# Patient Record
Sex: Male | Born: 1965 | Race: Black or African American | Hispanic: No | Marital: Single | Smoking: Current every day smoker
Health system: Southern US, Community
[De-identification: ages and names within clinical notes are randomized; demographics above are authoritative.]

## PROBLEM LIST (undated history)

## (undated) DIAGNOSIS — R51 Headache: Secondary | ICD-10-CM

## (undated) DIAGNOSIS — I10 Essential (primary) hypertension: Secondary | ICD-10-CM

## (undated) DIAGNOSIS — R519 Headache, unspecified: Secondary | ICD-10-CM

## (undated) DIAGNOSIS — I509 Heart failure, unspecified: Secondary | ICD-10-CM

## (undated) DIAGNOSIS — F1410 Cocaine abuse, uncomplicated: Secondary | ICD-10-CM

## (undated) DIAGNOSIS — I639 Cerebral infarction, unspecified: Secondary | ICD-10-CM

## (undated) HISTORY — DX: Cocaine abuse, uncomplicated: F14.10

## (undated) HISTORY — DX: Cerebral infarction, unspecified: I63.9

## (undated) HISTORY — PX: OTHER SURGICAL HISTORY: SHX169

## (undated) HISTORY — PX: NO PAST SURGERIES: SHX2092

---

## 1998-04-30 ENCOUNTER — Encounter: Payer: Self-pay | Admitting: Emergency Medicine

## 1998-04-30 ENCOUNTER — Emergency Department (HOSPITAL_COMMUNITY): Admission: EM | Admit: 1998-04-30 | Discharge: 1998-04-30 | Payer: Self-pay | Admitting: Emergency Medicine

## 2001-03-03 ENCOUNTER — Inpatient Hospital Stay (HOSPITAL_COMMUNITY): Admission: EM | Admit: 2001-03-03 | Discharge: 2001-03-06 | Payer: Self-pay | Admitting: Psychiatry

## 2001-06-07 ENCOUNTER — Emergency Department (HOSPITAL_COMMUNITY): Admission: EM | Admit: 2001-06-07 | Discharge: 2001-06-07 | Payer: Self-pay

## 2002-03-07 ENCOUNTER — Emergency Department (HOSPITAL_COMMUNITY): Admission: EM | Admit: 2002-03-07 | Discharge: 2002-03-07 | Payer: Self-pay | Admitting: Emergency Medicine

## 2002-05-06 ENCOUNTER — Emergency Department (HOSPITAL_COMMUNITY): Admission: EM | Admit: 2002-05-06 | Discharge: 2002-05-06 | Payer: Self-pay | Admitting: Emergency Medicine

## 2002-05-06 ENCOUNTER — Encounter: Payer: Self-pay | Admitting: Emergency Medicine

## 2002-06-09 ENCOUNTER — Emergency Department (HOSPITAL_COMMUNITY): Admission: EM | Admit: 2002-06-09 | Discharge: 2002-06-09 | Payer: Self-pay | Admitting: Emergency Medicine

## 2002-10-07 ENCOUNTER — Emergency Department (HOSPITAL_COMMUNITY): Admission: EM | Admit: 2002-10-07 | Discharge: 2002-10-07 | Payer: Self-pay | Admitting: Emergency Medicine

## 2002-10-07 ENCOUNTER — Encounter: Payer: Self-pay | Admitting: Emergency Medicine

## 2002-10-10 ENCOUNTER — Emergency Department (HOSPITAL_COMMUNITY): Admission: EM | Admit: 2002-10-10 | Discharge: 2002-10-10 | Payer: Self-pay | Admitting: Emergency Medicine

## 2002-10-18 ENCOUNTER — Emergency Department (HOSPITAL_COMMUNITY): Admission: EM | Admit: 2002-10-18 | Discharge: 2002-10-18 | Payer: Self-pay | Admitting: Emergency Medicine

## 2003-01-10 ENCOUNTER — Emergency Department (HOSPITAL_COMMUNITY): Admission: EM | Admit: 2003-01-10 | Discharge: 2003-01-10 | Payer: Self-pay | Admitting: *Deleted

## 2003-03-06 ENCOUNTER — Emergency Department (HOSPITAL_COMMUNITY): Admission: EM | Admit: 2003-03-06 | Discharge: 2003-03-06 | Payer: Self-pay | Admitting: Emergency Medicine

## 2003-04-28 ENCOUNTER — Emergency Department (HOSPITAL_COMMUNITY): Admission: EM | Admit: 2003-04-28 | Discharge: 2003-04-28 | Payer: Self-pay | Admitting: Emergency Medicine

## 2003-12-03 ENCOUNTER — Emergency Department (HOSPITAL_COMMUNITY): Admission: EM | Admit: 2003-12-03 | Discharge: 2003-12-03 | Payer: Self-pay | Admitting: Emergency Medicine

## 2004-03-25 ENCOUNTER — Emergency Department (HOSPITAL_COMMUNITY): Admission: EM | Admit: 2004-03-25 | Discharge: 2004-03-25 | Payer: Self-pay | Admitting: Emergency Medicine

## 2004-04-10 ENCOUNTER — Ambulatory Visit: Payer: Self-pay | Admitting: Internal Medicine

## 2004-05-01 ENCOUNTER — Emergency Department (HOSPITAL_COMMUNITY): Admission: EM | Admit: 2004-05-01 | Discharge: 2004-05-01 | Payer: Self-pay | Admitting: Emergency Medicine

## 2004-05-08 ENCOUNTER — Inpatient Hospital Stay (HOSPITAL_COMMUNITY): Admission: EM | Admit: 2004-05-08 | Discharge: 2004-05-09 | Payer: Self-pay | Admitting: Emergency Medicine

## 2004-05-13 ENCOUNTER — Ambulatory Visit: Payer: Self-pay | Admitting: Internal Medicine

## 2004-06-06 ENCOUNTER — Emergency Department (HOSPITAL_COMMUNITY): Admission: EM | Admit: 2004-06-06 | Discharge: 2004-06-06 | Payer: Self-pay | Admitting: Emergency Medicine

## 2004-07-09 ENCOUNTER — Emergency Department (HOSPITAL_COMMUNITY): Admission: EM | Admit: 2004-07-09 | Discharge: 2004-07-09 | Payer: Self-pay | Admitting: Emergency Medicine

## 2004-10-02 ENCOUNTER — Ambulatory Visit: Payer: Self-pay | Admitting: Internal Medicine

## 2004-10-13 ENCOUNTER — Ambulatory Visit: Payer: Self-pay | Admitting: *Deleted

## 2005-10-06 ENCOUNTER — Emergency Department (HOSPITAL_COMMUNITY): Admission: EM | Admit: 2005-10-06 | Discharge: 2005-10-06 | Payer: Self-pay | Admitting: Emergency Medicine

## 2006-06-04 ENCOUNTER — Emergency Department (HOSPITAL_COMMUNITY): Admission: EM | Admit: 2006-06-04 | Discharge: 2006-06-04 | Payer: Self-pay | Admitting: Emergency Medicine

## 2006-07-30 ENCOUNTER — Emergency Department (HOSPITAL_COMMUNITY): Admission: EM | Admit: 2006-07-30 | Discharge: 2006-07-30 | Payer: Self-pay | Admitting: Emergency Medicine

## 2006-12-29 ENCOUNTER — Emergency Department (HOSPITAL_COMMUNITY): Admission: EM | Admit: 2006-12-29 | Discharge: 2006-12-29 | Payer: Self-pay | Admitting: Emergency Medicine

## 2007-03-26 ENCOUNTER — Inpatient Hospital Stay (HOSPITAL_COMMUNITY): Admission: EM | Admit: 2007-03-26 | Discharge: 2007-03-29 | Payer: Self-pay | Admitting: Emergency Medicine

## 2007-05-12 ENCOUNTER — Encounter: Admission: RE | Admit: 2007-05-12 | Discharge: 2007-06-02 | Payer: Self-pay | Admitting: Orthopedic Surgery

## 2010-06-04 NOTE — H&P (Signed)
[name], [name] NO.:  [id number]   MEDICAL RECORD NO.:  [id number]          PATIENT TYPE:  INP   LOCATION:  5037                         FACILITY:  [facility]   PHYSICIAN:  Richardean Canal, P.A.    DATE OF BIRTH:  1965-02-16   DATE OF ADMISSION:  03/26/2007  DATE OF DISCHARGE:                              HISTORY & PHYSICAL   CHIEF COMPLAINT:  Left tib-fib fracture.   HISTORY OF PRESENT ILLNESS:  [name] is a 45 year old African  American male who fell off a porch at 1500 hours today.  The patient  sustained a left leg injury and is unable to bear weight.  No other  injuries.  No loss of consciousness.  The patient was brought to the ER  by car and found to have a left tib-fib fracture which was a displaced  but closed fracture.  The patient stated he had been drinking prior to  the fall and had had approximately 40 ounces of beer and 2 shots of  liquor.  Denied any drug use currently.  Last meal was the day prior on  March 25, 2007.   ALLERGIES:  Penicillin.   MEDICATIONS:  Toprol, unsure of dosage.   PAST MEDICAL HISTORY:  Hypertension.   PAST SURGICAL HISTORY:  A plate placed in his head.   SOCIAL HISTORY:  Positive half pack of cigarettes daily for 30 years.  Positive ETOH.  No recent drug use, however, history of cocaine use.  He  is unemployed.   PRIMARY CARE PHYSICIAN:  [name].   REVIEW OF SYSTEMS:  Denies any chest pain, shortness of breath, coronary  artery disease, diabetes mellitus, respiratory disease.  Positive  hypertension.   PHYSICAL EXAMINATION:  VITALS:  Temperature 98.1 degrees Fahrenheit,  blood pressure  126/78, pulse 79, respiratory rate 16.  O2 saturation  99% on room air.  GENERAL: The patient is a well-developed, well-nourished male in no  acute distress.  Slightly drowsy secondary to EtOH and pain medications  administered in the ER.  CARDIAC:  Regular rate and rhythm, no murmurs, rubs or gallops.  ABDOMEN: Soft,  nontender.  Positive bowel sounds.  CHEST: Clear to auscultation bilaterally.  No wheezing, rhonchi or  rales.  LEFT LOWER LEG:  Obvious deformity of the left lower leg.  Foot is  neurovascularly intact.  Compartments are soft.  He has good sensation  to light touch throughout the toes.  EHL, FHL is intact.   RADIOGRAPHS:  Radiographs left tib-fib showed displaced distal tibia  fracture and proximal left fibula fracture.   LABORATORY:  Labs at time of dictation:  CBC was back and H&H was 13.5  and 38.9.  White blood cell count was 10,900, platelets 158,000.   ASSESSMENT/PLAN:  A 45 year old African American male with a left  tibia/fibula fracture, displacement, closed and a history of  hypertension.  Also, a history of cocaine use, alcohol abuse and tobacco  abuse.   1. Intramedullary nail left tibia-fibula by Dr. Lestine Box on March 26, 2007.  2. Check labs.  Also, a portable chest x-ray  was ordered preop the EKG      was ordered preop.  3. Will admit patient postop to Dr. Ashok Norris service for pain and      observation.      Richardean Canal, P.A.     GC/MEDQ  D:  03/26/2007  T:  03/29/2007  Job:  [id number]   cc:   Leonides Grills, M.D.

## 2010-06-04 NOTE — Op Note (Signed)
NAMEABU, HEAVIN NO.:  [id number]   MEDICAL RECORD NO.:  [id number]          PATIENT TYPE:  INP   LOCATION:  5037                         FACILITY:  [facility]   PHYSICIAN:  Leonides Grills, M.D.     DATE OF BIRTH:  03-25-1965   DATE OF PROCEDURE:  03/26/2007  DATE OF DISCHARGE:                               OPERATIVE REPORT   PREOPERATIVE DIAGNOSIS:  Left closed tib-fib fracture.   POSTOPERATIVE DIAGNOSIS:  Left closed tib-fib fracture.   OPERATION:  IM nailing left tib-fib fracture.   ANESTHESIA:  General.   SURGEON:  Leonides Grills, MD.   ASSISTANT:  Rexene Edison, PA-C.   ESTIMATED BLOOD LOSS:  Minimal.   TOURNIQUET TIME:  Approximately an hour and 20 minutes.   COMPLICATIONS:  None.   DISPOSITION:  Stable to PR.   INDICATIONS:  This is a 45 year old male who slipped and fell off a  porch this afternoon and sustained the above injury.  He was consented  for the above procedure.  All risks which include infection, nerve or  vessel injury, nonunion, malunion, hardware tissue, hardware failure,  persistent worse pain, prolonged recovery, the defects of smoking on  healing, stiffness, arthritis of juxta-articular joints and possibility  that the nail or hardware needs to be removed were all explained and  questions were encouraged and answered.   OPERATION:  The patient brought to the operating room, placed in supine  position after adequate general endotracheal tube anesthesia  was  administered as well as Ancef gram IV piggyback.  The bump was placed on  the ipsilateral hip internally rotating the left lower extremity.  Left  lower extremity was then prepped and draped in a sterile manner over a  proximally thigh tourniquet.  Limb was gravity saline and tourniquet was  elevated 290 mmHg.  A medial parapatellar incision was then made.  Dissection was carried down through skin.  Hemostasis was obtained.  Retinaculum was opened just medial to the patella  tendon.  Dissection  was carried down to the anterior central aspect of the tibial plateau.  Under C-arm guidance, a awl was then placed and once this was centered  in the AP and lateral planes, ball-tip guide wire was placed.  Fracture  site was then anatomically reduced and once this was done, we then  placed a guidewire across the fracture site to the center of the tibial  plafond in both the AP and lateral planes.  We then reamed to a 9 mm  reamer where we had significant chatter at the isthmus.  We then  measured off the guidewire and chose a 145.  We then chose a 145 x 8 mm  Synthes tibial IM nail.  We then advanced this over the guidewire and  across the fracture site, then removed the guide wire and advanced the  rest of the nail to the depth that was desired.  We then proximally  locked the nail with one static, one dynamic screw through the out  rigger device through a nick and spread technique.  Once these screws  were in place and  this was verified under C-arm guidance in AP and  lateral planes to be in excellent position, we then, using a free-handed  technique, placed two distal locking screws in the coronal plane  multiple, both of which were in the nail and this was done with the  fracture site held in reduced position.  Final x-rays were obtained in  AP and lateral planes and showed locking screws in proper position and  acceptable alignment.  Tibial crest was in line with the second toe  which was consistent with the contralateral side.  Wounds were copiously  irrigated with normal saline.  End cap was applied to the proximal  portion of the nail which was a zero cap.  Tourniquet deflated.  Hemostasis was obtained.  Compartments were soft in the leg and foot.  At the end of the procedure, there was palpable dorsalis pedis and  posterior tibial pulse.  Foot was warm.  Retinaculum was closed with  zero Vicryl.  Subcu was closed 3-0 Vicryl.  Skin was closed with  staples.   Sterile dressing was applied.  A posterior mold was applied.  The patient was stable to the PR.      Leonides Grills, M.D.  Electronically Signed     PB/MEDQ  D:  03/27/2007  T:  03/29/2007  Job:  [id number]

## 2010-06-04 NOTE — Discharge Summary (Signed)
[name], [name]              ACCOUNT NO.:  [id number]   MEDICAL RECORD NO.:  [id number]          PATIENT TYPE:  INP   LOCATION:  5037                         FACILITY:  [facility]   PHYSICIAN:  Leonides Grills, M.D.     DATE OF BIRTH:  May 28, 1965   DATE OF ADMISSION:  03/26/2007  DATE OF DISCHARGE:  03/29/2007                               DISCHARGE SUMMARY   ADMITTING DIAGNOSES:  1. Closed left tibial fracture displaced.  2. Closed left proximal femoral fracture.  3. Hypertension.   DISCHARGE DIAGNOSES:  1. Status post intramedullary nailing of left tibial fracture.  2. Hypertension, poor control.   HISTORY OF PRESENT ILLNESS:  Mr. Scurlock is a 45 year old African  American male who fell off a porch at 1500 hours on March 26, 2007.  The  patient sustained left leg injury and was unable to bear weight.  No  other injuries.  No loss of consciousness.  The patient was brought to  the ER by car and found to have a left tib-fib fracture which was  displaced but closed.  The patient stated he had been drinking prior to  falling and had approximately 40 ounces of beer and 2 shots of liquor.  He denied any current drug use.  The patient was admitted to undergo  left IM nailing of the tib-fib fracture.   SURGICAL PROCEDURE:  The patient was taken to the operating room on  March 26, 2007, by Dr. Leonides Grills assisted by Richardean Canal, P.A.-C.  The patient was placed under general anesthesia and then underwent an IM  nailing for left tibia and fibula fracture.  The patient tolerated the  procedure well and returned to recovery in good and stable condition.   CONSULTATIONS:  The following consults were obtained:  PT/OT, case  management.   HOSPITAL COURSE:  Postop day 1, the patient complained of pain.  He  __________  nontender foot, neurovascularly intact.  CBC: white count  was 10,400, hemoglobin 12.4, hematocrit 35.6, and platelets 142,000.  Routine chemistries on postop day 1, sodium  140, potassium 4.2, chloride  105, bicarb 27, glucose 80, BUN 6, and creatinine 1.07. On  postop day  2, the patient afebrile, vital signs stable, slightly hypertensive with  blood pressure 142/66.  He complained of lower leg pain.  Ace wrap was  suggested.  Postop day 3, the patient complained of some pain in left  lower leg.  No shortness of breath.  No chest pain.  No calf pain.  Tolerating diet.  Voiding well.  No bowel movement.  The patient is  afebrile.  The patient is hypertensive with blood pressure 176/103,  heart rate 94, and respiratory rate 18.  Hypertension was felt to be  probably secondary to pain.  Also questioned if the patient was on  correct dose of Toprol-XL as he did not know the dosage and lowest  dosage was started.  He was given Dulcolax 10 mg per rectum x1 due to  his constipation.  The patient was later discharged to home in good  stable condition.   ROUTINE LABORATORY ON ADMISSION:  White count was 10,900, hemoglobin  13.5, hematocrit 38.9, and platelet 158,000.  Coags on admission, PT was  12.7, INR was 0.9.  Routine chemistries on admission sodium is 131,  potassium 3.6, chloride 97, bicarb 21, glucose 78, BUN 5, creatinine is  0.17.  Hepatic enzymes on admission, TP was 6.7, ALT was 4.2, AST was  elevated at 59, ALT was elevated at 81, ALP was 73 and total bilirubin  was at 0.6.   Radiographs dated March 26, 2007, at 6:16 p.m. right tib-fib 2-view  showed comminuted leg fracture of distal tibial diaphysis.  Oblique  fracture approximately at fibular metaphysis.  Left ankle 2 view, no  evidence of ankle fractures and had distal tib-fib fracture noted.  Left  tib-fib on March 26, 2007, at 1150 hours, the patient is status post ORIF  with left tibial fracture, no complicating features.  Right proximal  fibular  __________ fractured shaft minimally displaced.   DISCHARGE INSTRUCTIONS:  1. Diet, no restrictions.  2. Percocet 5/325 1-2 tablets every 4-6 hours  for pain.  3. Robaxin 500 mg 1 tablet every 6-8 hours for spasm.  4. Aspirin 325 mg 1 tab twice a day x2 months.  5. Resume Toprol-XL.  6. Stool softener and laxative as needed for constipation.   FOLLOWUP:  The patient needs to follow up Dr. Lestine Box in 11 days.  The  patient to call [phone] for appointment.  The patient to follow up with  primary care physician Dr. __________ in  approximately 7 days for blood  pressure.  The patient will call for appointment.  The patient was given  prescriptions for Toprol 25 mg 1 p.o. daily.   SPECIAL INSTRUCTIONS:  The patient should not weight bear in left lower  leg.  Splints to be kept clean, dry, and intact.  The patient is to  elevate the left leg above the heart __________ .  Again, the patient's  nonweightbearing left lower leg with crutches.   CONDITION ON DISCHARGE:  The patient was discharged home in good stable  condition.      Richardean Canal, P.A.      Leonides Grills, M.D.  Electronically Signed    GC/MEDQ  D:  04/16/2007  T:  04/17/2007  Job:  [id number]

## 2010-06-07 NOTE — Consult Note (Signed)
NAMEMARVIS, [name]              ACCOUNT NO.:  [id number]   MEDICAL RECORD NO.:  [id number]          PATIENT TYPE:  INP   LOCATION:  1831                         FACILITY:  [facility]   PHYSICIAN:  Melvyn Novas, M.D.  DATE OF BIRTH:  1965-07-31   DATE OF CONSULTATION:  05/08/2004  DATE OF DISCHARGE:                                   CONSULTATION   NEUROLOGY CONSULTATION:   REASON FOR CONSULTATION:  Stroke in the ER.   HISTORY:  The patient is still in the ER after 15 hours.  This is a patient  who 15 hours ago presented to the ER with complaints of right-sided  weakness, the same complaints he had last Saturday and Sunday and the same  complaint that led to an ER evaluation last Wednesday.  [name]  is seen here today with recurrent right-sided weakness and involuntary  movements.  He appears to be rather agitated and somewhat aggressive and  during my interview he then began severely to shake, stiffen his body while  he still continues to speak.  Interestingly, as the body rigidity and  shaking becomes less severe he now starts stuttering which then in return  wears off and he refuses at that point to move at all but actively verbally  tells me that he is not going to do it.  In the ER since yesterday the  patient was finally accepted this morning by [facility] and  they requested a neurology consult for stroke after ordering an MRI.  The  MRI that I already read was normal by diffusion weighted images shows no  stroke.  The patient is now ruminating rocking his body back and forth then  is shaking.  When I offered him water, he stopped shaking and can hold the  cup with his weak right arm.  I asked him where his urinal is and he turned  his head and the right arm towards the urinal and actually put it next to  him on the bedside.   EXAMINATION:  Blood pressure is 123/79, heart rate is 62, respiratory rate  is 16, temperature is 97, oxygenation is 99% on  room air.  The patient has  clear auscultation of the heart and lungs.  Abdomen is soft.  The patient  wet himself in spite of the urinal standing right next to him and him  obviously being aware of where it stands.   Mental status:  The patient started stuttering in mid sentence after  speaking fluently and clearly before.  He is aware that he was in the ER  last week, he was aware that he came to the ER yesterday evening, he knows  date and time.  Cranial nerve examination:  Pupils are equal to light and  accommodation, full gaze, symmetric facial strength, tongue and uvula are  midline, he has a normal gag.  When I asked him to stick out his tongue, he  pushed his tongue into his right buccal tissue and I can feel good  resistance there but he is obviously trying to impress [name] with a  tongue  deviation.  Neck appears rigid.  He does not allow me to flex it but when I  put his bed back rest up he was able to actually move his neck spontaneously  in all directions.   Motor examination shows equal grip on first attempt.  On second attempt the  patient decided to spread his thumb and index finger on the right hand so  that he would not flex them for the grip exam that he previously performed  with all five fingers.  When I asked him to spread his fingers and keep them  apart, there is no weakness of the thumb or index finger.  Deep tendon  reflexes are 2+.  Each time I elicit a reflex the patient states to me that  it hurts and gets very upset about it.  Sensory is not otherwise testable.  The patient is not compliant.  Babinski's negative, downgoing to plantar  stimulation.  The patient refuses to sit up without assistance and states he  cannot walk.   ASSESSMENT:  Inconsistent examination not likely related to a focal  neurologic deficit and actually not likely related to an organic brain  lesion.  Diffusion weighted image was negative.  MRI looked normal.  I would  strongly suspect  that this is active resistance to passive movement and  __________ response to leg raise, etc., has either suffered from a  conversion disorder or is malingering or has a history of a psychiatric  illness that does not allow him  to reflect on his behavior.  I strongly recommended psychiatric consult.  The patient at this time has stable vital signs, is afebrile, has a normal  MRI, normal EKG and should be transferred to [facility].  He is  medically cleared.      CD/MEDQ  D:  05/08/2004  T:  05/08/2004  Job:  [id number]   cc:   Antonietta Breach, M.D.   Dr. __________  [employer]

## 2010-06-07 NOTE — H&P (Signed)
[facility]  Patient:    [name], [name] Visit Number: [id number] MRN: [id number]          Service Type: PSY Location: 300 0301 02 Attending Physician:  Rachael Fee Dictated by:   Candi Leash. Orsini, N.P. Admit Date:  03/03/2001                     Psychiatric Admission Assessment  DATE OF ADMISSION:  March 03, 2001.  PATIENT IDENTIFICATION:  This is a 45 year old single African-American male voluntarily admitted on March 03, 2001, to [facility] for alcohol dependence and depression.  HISTORY OF PRESENT ILLNESS:  The patient presents with a history of alcohol use requesting detoxification.  The patient has been drinking since the age of 2 where the longest history of sobriety has been four days while in jail.  He has been drinking nine 40 ounce beers and a pint of liquor with a history of blackouts.  His last drink was on March 02, 2001, having withdrawal symptoms on admission.  Reports depressive symptoms but denying any suicidal thoughts.  He reports he gets "violent" when he drinks.  Sleeping and appetite has been satisfactory.  He denies any psychotic symptoms.  He denies withdrawal symptoms at present.  PAST PSYCHIATRIC HISTORY:  First hospitalization to [facility]. No other inpatient admissions.  He was at [facility] but none recently.  Longest history of sobriety has been four days while the patient was in jail in September 2002.  SUBSTANCE ABUSE HISTORY:  He smokes.  He drinks nine 40 ounce beers plus a pint of liquor every day since the age of 52.  History of blackouts.  No history of seizures.  Has been smoking marijuana and cocaine prior to this admission.  PAST MEDICAL HISTORY:  Primary care [facility]: Dr. Donia Guiles.  Medical problems: Hypertension.  MEDICATIONS:  None.  DRUG ALLERGIES:  PENICILLIN.  PHYSICAL EXAMINATION:  GENERAL:  The patient appears as a  well-nourished African-American male without complaints or withdrawal symptoms.  LABORATORY DATA:  Alcohol level was less than 5.  BUN 4.  RBC 4.03, hematocrit 38.3.  SOCIAL HISTORY:  This is a 45 year old single African-American male with one child, 49 years of age.  He lives with his girlfriend.  He works in Human resources officer.  He completed the 12th grade.  He has a court date pending for assault on a male, driving without a license, and child support.  FAMILY HISTORY:  States brother has some depressive symptoms.  MENTAL STATUS EXAMINATION:  He is an alert, young African-American male casually and neatly dressed, cooperative with fair eye contact.  Speech is normal and relevant.  Mood is depressed.  Affect is flat.  Thought processes are coherent.  There is no evidence of psychosis, no auditory or visual hallucinations, no suicidal or homicidal ideations, no paranoia.  Cognitive: Intact.  Memory is good.  Judgment is fair.  Insight is good.  Poor impulse control.  ADMISSION DIAGNOSES: Axis I:    1. Alcohol dependence.            2. Depression, not otherwise specified.            3. Polysubstance abuse. Axis II:   Deferred. Axis III:  Hypertension. Axis IV:   Problems with primary support group, legal system, and other            psychosocial problems. Axis V:    Current is 40, estimated this past  year is 57.  INITIAL PLAN OF CARE:  Plan is a voluntary admission to [facility] for alcohol dependence and depression.  Will contract for safety. Check every 15 minutes.  The patient promises safety.  Will initiate the phenobarbital protocol, encourage fluids.  Will assess depressive symptoms as the patient detoxifies.  Goal is to detoxify safely, to follow up with mental health, to remain alcohol and drug free.  ESTIMATED LENGTH OF STAY:  Three to five days. Dictated by:   Candi Leash. Orsini, N.P. Attending Physician:  Rachael Fee DD:  03/05/01 TD:  03/05/01 Job:  [id number]

## 2010-06-07 NOTE — Discharge Summary (Signed)
NAMECALDER, OBLINGER              ACCOUNT NO.:  [id number]   MEDICAL RECORD NO.:  [id number]          PATIENT TYPE:  INP   LOCATION:  4740                         FACILITY:  [facility]   PHYSICIAN:  Mobolaji B. Bakare, M.D.DATE OF BIRTH:  04-Jul-1965   DATE OF ADMISSION:  05/07/2004  DATE OF DISCHARGE:  05/09/2004                                 DISCHARGE SUMMARY   PRIMARY CARE PHYSICIAN:  Unassigned.   CONSULTATIONS:  1.  Neurology, Melvyn Novas, M.D.  2.  Psychiatric, Antonietta Breach, M.D.   FINAL DIAGNOSES:  1.  Somatoform disorder.  2.  Alcohol dependence.  3.  Substance abuse.  4.  Hypertension.  5.  Pseudoseizure.  6.  Abnormal liver enzymes related to alcohol abuse.   CHIEF COMPLAINT:  Left sided weakness and shaking.   BRIEF HISTORY:  [name] is a 45 year old African-American male with a  history of polysubstance abuse, alcohol. He has had multiple ED visits with  complaint of left sided weakness. On the day of admission, there was a  complaint by his mother who was informed by patient's girlfriend that he is  not feeling well. In the emergency department, he demonstrated weakness of  the left upper and lower limbs with recurrent jerky movement of the right  while awake. He had an MRI of the brain which did not show any acute  findings. Also CT scan, no acute intracranial findings. The patient was  admitted for further evaluation and psychiatric consult with a possible view  of underlying somatic disorder.   PROCEDURE:  1.  MRI brain showed no acute infarct posterior parieto-occipital      symmetrical signal alteration in the periventricular distribution. The      etiology which is uncertain.  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 There is a suggestion of chronic mastoid sinus disease      which is an incidental finding.  2.  CT scan of the head, no acute intracranial findings.  3.  Chest x-ray showed cardiomegaly without active disease.  4.  The patient had a normal EEG.   PERTINENT PHYSICAL FINDINGS:  GENERAL:  The patient was alert, oriented,  intact, pleasant person.  VITAL SIGNS:  Blood pressure 145/97, pulse 168, respiratory rate of 21, O2  sat of 99%, temperature 99.1.  NEUROLOGIC:  The main finding was on neurological examination. The patient  had muscle power 3/5 left upper and lower limbs, 5/5 right upper and lower  limbs. During the course of evaluation and examination, the patient had  intermittent jerky movement involving both lower and upper limbs on the  right which were tonic in nature, no tonic clonic convulsions and during  this intermittent episodes,he was awake and communicating.  LUNGS:  Unremarkable.  CVS:  Unremarkable.  ABDOMEN:  Unremarkable.   LABORATORY DATA:  CBC and DIF was unremarkable. Alcohol level 8, PT 11.6,  INR 0.8, PTT 23. Sodium 136, potassium 3.6, creatinine 0.8, BUN 5, AST 46,  ALT 51, glucose 60. Urine drug screen none detected. TSH 1.353, ESR 0,  homocysteine  level 10.34,  protein C 98% within normal reference range.  Protein S 97% within normal reference range.   HOSPITAL COURSE:  The patient was evaluated by neurologist as he is well  known to the neurologic service. During physical examination, the patient  demonstrated inconsistency in the physical findings and he was not  cooperative with physical examination by Melvyn Novas, M.D. There was no  particular pattern to the neurologic deficits that he is demonstrating. EEG  was done and this was normal.   He was evaluated by Dr. Jeanie Sewer and it was determined that the patient has  somatoform disorder, alcohol dependence and conversion. The patient declined  further treatment. The patient was offered 12 step groups and the patient  declined any form of treatment.   The patient is not committable. He does have capacity to make decisions for  himself. He insisted on going home.   During the course of counseling and  family meeting with the patient's  mother, brother and family friend, it was obvious that whenever the patient  was confronted with any statement he wouldn't want to hear he goes into this  pattern of abnormal movement. In particular, when issues like cocaine,  alcohol, and upcoming court cases were raised, he demonstrated those  abnormal body movements; however, he quickly becomes stronger whenever he  gets angry and he could use his left side during those moments of anger.  Next, I further recommended inpatient admission but patient continued to  refuse.   He was set up to have home PT/OT and to followup at [facility] for  outpatient clinic. It is unclear at this point if the patient attends [facility] and the resident service or not. He was given the  telephone number to [facility] to follow up.   The patient was discharged home on:  1.  Thiamine 100 mg once a day.  2.  Multivitamin one p.o. q. day.  3.  Hydrochlorothiazide 25 mg once a day.  4.  Aspirin 81 mg once a day.      MBB/MEDQ  D:  05/27/2004  T:  05/27/2004  Job:  [id number]   cc:   Melvyn Novas, M.D.  [address]  [address]  [place]  [place] [address]  Fax: [phone]   Antonietta Breach, M.D.

## 2010-06-07 NOTE — Procedures (Signed)
CONCLUSION:  This is a normal EEG for the patient's age and conscious state.  There is no evidence of seizures, hemifocality or slowing.      VW:UJWJ  D:  05/08/2004 19:55:07  T:  05/08/2004 21:09:34  Job #:  [id number]   cc:   Geoffery Lyons, M.D.   Mallory Shirk, MD   Antonietta Breach, M.D.

## 2010-06-07 NOTE — H&P (Signed)
NAMEGUNNAR, HEREFORD              ACCOUNT NO.:  [id number]   MEDICAL RECORD NO.:  [id number]          PATIENT TYPE:  INP   LOCATION:  1831                         FACILITY:  [facility]   PHYSICIAN:  Mallory Shirk, MD     DATE OF BIRTH:  August 04, 1965   DATE OF ADMISSION:  05/07/2004  DATE OF DISCHARGE:                                HISTORY & PHYSICAL   PRIMARY CARE PHYSICIAN:  Unassigned.   CHIEF COMPLAINT:  Left sided weakness and shaking times five to six days.   HISTORY OF PRESENT ILLNESS:  Mr. Erazo is a 45 year old African American  gentleman with a history of multiple substance abuse including cocaine and  alcohol, brought in today by his mother for left sided weakness and shaking  x 5 days.  Mother states that about five days ago, the patient's girlfriend  notified the mother that the patient was shaking.  When the mother saw the  patient, she noticed some left sided weakness.  However, the patient  continued to function.  He was able to drive the morning of May 07, 2004  to go get alcohol from the store and had some alcohol before he was  admitted.  During the time of this exam, the patient is shaking episodically  upper extremities and lower extremities, not in sync.  He is able to  converse and answers questions appropriately through these episodes.  The  patient says he has a headache, other than that denies any chest pain,  shortness of breath, or any other complaints.  He states that he has been  drinking this morning and will continue to drink regardless of the  consequences.  No other history at this time except the patient had some  fever over the past five days and some nausea but no vomiting.   PAST MEDICAL HISTORY:  1.  Hypertension.  2.  Alcohol abuse.  He had an admission in 2003, for dependence and      depression.  3.  Cocaine abuse.  4.  Irregular heart beat, diagnosed about six years ago but no further      workup done.   MEDICATIONS ON ADMISSION:   Hydrochlorothiazide.   ALLERGIES:  PENICILLIN.   FAMILY HISTORY:  Significant for hypertension and hyperlipidemia in mother.  Hypertension in sister and brother.  Epilepsy in another brother.   SOCIAL HISTORY:  The patient lives with his girlfriend and drinks alcohol on  a daily basis, multiple substance abuse including cocaine and opiates.   REVIEW OF SYSTEMS:  Other than headaches, the patient denies any other  complaints at the present time more than ten systems reviewed.   PHYSICAL EXAMINATION:  VITAL SIGNS:  Blood pressure 145/97, pulse 68,  respirations 21, sats 99% on room air, temperature 99.1.  GENERAL:  [place], [place] American gentleman lying in bed in no acute  distress.  Alert, oriented to person and place, answers questions  appropriately.  HEENT:  Normocephalic, atraumatic.  PERRL.  Sclerae anicteric.  Mucous  membranes moist.  Facial asymmetry noted.  NECK:  Supple.  No LAD.  No JVD.  LUNGS:  Clear to auscultation bilaterally.  ABDOMEN:  Positive bowel sounds.  Soft, no tenderness, no masses.  EXTREMITIES:  Left hemiparesis noted upper and lower extremities.  NEUROLOGIC:  Compromised by lack of patient cooperation.  Left hemiparesis  noted.  DTRs 2+ and symmetrical all four extremities.   IMAGING:  CT of the head negative for acute intracranial process.   WBCs 5.7, hemoglobin 14.9, hematocrit 42.6, platelets 180.  PT 11.6, INR  0.8, PTT 23.  Sodium 136, potassium 3.6, chloride 101, carbon dioxide 30,  glucose 62, BUN 5, creatinine 0.8, calcium 9.2.  Total protein 6.8, albumin  4.1, AST 46, ALT 51, total bili 0.5, alkaline phosphatase 83.  Urinalysis  negative.  Urine drug screen negative for opiates, cocaine, benzos,  barbiturates, amphetamines, and __________  Valeda Malm.  Alcohol level 8  mg/dL (range 6-21).  Chest x-ray:  Cardiomegaly, no active disease seen.  EKG:  Nonspecific T wave abnormalities.  No significant change from May 01, 2004 and December 03, 2003.   ASSESSMENT:  A 45 year old African American gentleman admitted with left  hemiparesis, shaking.   PLAN:  1.  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  The patient's      mother states that he has been told before that his multiple substance      abuse and hypertension can lead to a stroke.  However, the patient      continues to drink alcohol and abuse cocaine.  A CT of the head is      negative.  We will get an MRI of the brain for further evaluation.  We      will also get a neurology consult for evaluation.  2.  Substance abuse.  The patient maybe shaking due to withdrawal, however,      he has been doing this for over five days.  No history of seizure      activity before.  The patient's shaking is episodic, does not appear to      be a seizure.  He is alert and can answer questions during this shaking.      No injuries to the tongue or in the mouth seen.  This could be      withdrawal from alcohol/opiates as well.  We will give him Ativan IV      q.4h. p.r.n. agitation.  3.  Hypertension.  We will give him Catapres __________  0.1 mg every day.      Also we have hydralazine 5 mg IV q.6h. p.r.n. systolic greater than 180.  4.  Fluids and nutrition.  The patient is NPO for now until he can be      further evaluated for safe p.o. intake.  We will be giving him IV fluids      D-5 normal saline at 75 ml/hr.  We will also give him thiamine IV 100      mg.  It was noted that his blood glucose was 62.  We will have Accu-Chek      every four hours with routine hypoglycemic orders in place.  5.  We will also request a social work consult for outpatient followup and      substance abuse counseling.      GDK/MEDQ  D:  05/08/2004  T:  05/08/2004  Job:  [id number]

## 2010-06-07 NOTE — Discharge Summary (Signed)
[facility]  Patient:    [name], CISZEWSKI Visit Number: [id number] MRN: [id number]          Service Type: PSY Location: 300 0301 02 Attending Physician:  Rachael Fee Dictated by:   Reymundo Poll Dub Mikes, M.D. Admit Date:  03/03/2001 Discharge Date: 03/06/2001                             Discharge Summary  CHIEF COMPLAINT AND PRESENTING ILLNESS:  This was the first admission to [facility] for this 45 year old male who was admitted for alcohol dependence and depression.  History of alcohol use, requesting detox.  Has been drinking since the age of 63.  Longest sobriety 4 days while in jail. Drinking 9 40-ounce beers and a pint of liquor.  History of blackouts.  Last drink was on February 11.  Having withdrawal symptoms on admission. Depressive symptoms, denied any suicidal thoughts.  Reports he gets violent when he drinks.  Sleeping and appetite has been satisfactory.  Denies any psychotic symptoms, denies any withdrawal at the time of the evaluation.  PAST PSYCHIATRIC HISTORY:  First time at [facility].  He was at [facility] for outpatient treatment.  SUBSTANCE ABUSE HISTORY:  Drinks 9 40-ounce beers plus a pint of liquor every day since the age of 27.  History of blackouts, no seizures.  Has been smoking marijuana and cocaine prior to this admission.  MEDICAL HISTORY:  Noncontributory except for hypertension.  MEDICATIONS:  None.  PHYSICAL EXAMINATION:  Performed, failed to show any acute findings.  MENTAL STATUS EXAMINATION:  Reveals a well-nourished, well-developed, casually and neatly dressed male, cooperative, fair eye contact.  Speech was normal and relevant, mood was depressed, affect was flat.  Thought processes are coherent.  No evidence of psychosis, no auditory or visual hallucinations, no suicidal or homicidal ideas, no paranoia.  Cognition well preserved.  ADMITTING  DIAGNOSES: Axis I:    1.  Alcohol dependence.            2. Cocaine and marijuana abuse.            3. Depressive disorder not otherwise specified. Axis II:   No diagnosis. Axis III:  Hypertension. Axis IV:   Moderate. Axis V:    Global assessment of function upon admission 35-40, highest            global assessment of function in past year 65.  LABORATORY WORK-UP:  CBC was within normal limits.  Blood chemistries were within normal limits.  Thyroid profile was within normal limits.  Urine drug screen was positive for marijuana, benzodiazepines and cocaine.  COURSE IN THE HOSPITAL:  He was admitted and started on intensive individual and group psychotherapy.  He was detoxified using phenobarbital.  He was given trazodone for sleep.  As the hospitalization progressed, detoxification progressed, he continued to do well.  The detoxification went uneventfully. By February 15, he was in full contact with reality, mood euthymic, affect bright, broad.  No suicidal ideas, no homicidal ideas.  Willing and motivated to pursue further abstinence working through [employer].  DISCHARGE  DIAGNOSES: Axis I:    1. Alcohol dependence.            2. Depressive disorder not otherwise specified.            3. Marijuana and Cocaine abuse. Axis II:   No diagnosis. Axis III:  Hypertension. Axis IV:  Moderate. Axis V:    Global assessment of function upon discharge 60.  DISCHARGE MEDICATIONS: 1. Protonix 40 mg daily. 2. Phenobarbital to finish detox. 3. Trazodone 100 at bedtime for sleep.  DISPOSITION:  To follow up at the [facility]. Dictated by:   Reymundo Poll Dub Mikes, M.D. Attending Physician:  Rachael Fee DD:  04/07/01 TD:  04/08/01 Job: [id number]

## 2010-10-14 LAB — CBC
HCT: 35.6 — ABNORMAL LOW
Hemoglobin: 13.5
MCHC: 34.8
MCV: 96.2
MCV: 97.3
Platelets: 158
RBC: 4.04 — ABNORMAL LOW
RDW: 13.8

## 2010-10-14 LAB — DIFFERENTIAL
Basophils Relative: 0
Eosinophils Relative: 0
Lymphocytes Relative: 5 — ABNORMAL LOW
Lymphs Abs: 0.5 — ABNORMAL LOW
Monocytes Absolute: 0.4
Neutrophils Relative %: 91 — ABNORMAL HIGH

## 2010-10-14 LAB — BASIC METABOLIC PANEL
BUN: 6
Chloride: 105
GFR calc Af Amer: >60
Potassium: 4.2
Sodium: 140

## 2010-10-14 LAB — COMPREHENSIVE METABOLIC PANEL
ALT: 81 — ABNORMAL HIGH
AST: 59 — ABNORMAL HIGH
Albumin: 4.2
GFR calc Af Amer: >60
Sodium: 131 — ABNORMAL LOW
Total Bilirubin: 0.6
Total Protein: 6.7

## 2010-10-28 LAB — POCT CARDIAC MARKERS
CKMB, poc: <1 — ABNORMAL LOW
Myoglobin, poc: 34
Operator id: 282201
Troponin i, poc: <0.05

## 2010-10-28 LAB — I-STAT 8, (EC8 V) (CONVERTED LAB)
Acid-base deficit: 1
Bicarbonate: 20.2
Hemoglobin: 15.6
Operator id: 282201
Potassium: 4.3
pCO2, Ven: 24.8 — ABNORMAL LOW

## 2010-10-28 LAB — POCT I-STAT CREATININE: Operator id: 282201

## 2012-12-01 ENCOUNTER — Emergency Department (HOSPITAL_COMMUNITY)
Admission: EM | Admit: 2012-12-01 | Discharge: 2012-12-01 | Disposition: A | Discharge status: Home / Self Care | Payer: Medicaid Other | Attending: Emergency Medicine | Admitting: Emergency Medicine

## 2012-12-01 ENCOUNTER — Encounter (HOSPITAL_COMMUNITY): Payer: Self-pay | Admitting: Emergency Medicine

## 2012-12-01 DIAGNOSIS — F17200 Nicotine dependence, unspecified, uncomplicated: Secondary | ICD-10-CM | POA: Insufficient documentation

## 2012-12-01 DIAGNOSIS — B86 Scabies: Secondary | ICD-10-CM

## 2012-12-01 DIAGNOSIS — I10 Essential (primary) hypertension: Secondary | ICD-10-CM | POA: Insufficient documentation

## 2012-12-01 DIAGNOSIS — Z880 Allergy status to penicillin: Secondary | ICD-10-CM | POA: Insufficient documentation

## 2012-12-01 DIAGNOSIS — R21 Rash and other nonspecific skin eruption: Secondary | ICD-10-CM

## 2012-12-01 HISTORY — DX: Essential (primary) hypertension: I10

## 2012-12-01 MED ORDER — PERMETHRIN 5 % EX CREA: TOPICAL_CREAM | CUTANEOUS | Status: DC

## 2012-12-01 MED ORDER — HYDROXYZINE HCL 25 MG PO TABS: 25.0000 mg | ORAL_TABLET | Freq: Four times a day (QID) | ORAL | Status: DC

## 2012-12-01 MED ORDER — TRIAMCINOLONE ACETONIDE 0.1 % EX CREA: 1.0000 "application " | TOPICAL_CREAM | Freq: Two times a day (BID) | CUTANEOUS | Status: DC

## 2012-12-01 NOTE — ED Notes (Addendum)
Rash that comes and goes for a week states on hands itches between fingers on butt cheeks and back and belly button worse at night and in the heat better in cold

## 2012-12-01 NOTE — ED Provider Notes (Signed)
CSN: [id number]     Arrival date & time 12/01/12  0916 History  This chart was scribed for non-physician practitioner Arthor Captain, PA-C working with Geoffery Lyons, MD by Joaquin Music, ED Scribe. This patient was seen in room TR02C/TR02C and the patient's care was started at  12:21 PM  Chief Complaint  Patient presents with  . Rash   The history is provided by the patient. No language interpreter was used.   HPI Comments: [name] is a 47 y.o. male who presents to the Emergency Department complaining of ongoing worsening rash to bilateral hands, lower back, and buttocks area that began 1 week ago. Pt states the rash seems to be spreading. Pt denies having rash present in penial area. He states describes the rash as "itching".He states when he is in a cool area, he does not have any problems but states he has a flare up when he is in heat. Pt states his grandson recently was diagnosed with scabes.  Pt states he recently had a change of detergent and is concerned he may be havaing an allergic reaction. Pt states he has been washing all clothes and bedroom sheets due to present rash.   Past Medical History  Diagnosis Date  . Hypertension    No past surgical history on file. No family history on file. History  Substance Use Topics  . Smoking status: Current Every Day Smoker  . Smokeless tobacco: Not on file  . Alcohol Use: Yes    Review of Systems  Skin: Positive for rash.  All other systems reviewed and are negative.    Allergies  Penicillins  Home Medications   Current Outpatient Rx  Name  Route  Sig  Dispense  Refill  . DiphenhydrAMINE HCl (DIPHEDRYL PO)   Oral   Take 2 tablets by mouth daily as needed (itching).         . hydrocortisone cream 1 %   Topical   Apply 1 application topically 2 (two) times daily as needed for itching.          BP 174/113  Pulse 92  Temp(Src) 97.9 F (36.6 C)  Resp 16  SpO2 98%  Physical Exam  Nursing note  and vitals reviewed. Constitutional: He is oriented to person, place, and time. He appears well-developed and well-nourished. No distress.  HENT:  Head: Normocephalic and atraumatic.  Eyes: EOM are normal.  Neck: Neck supple. No tracheal deviation present.  Cardiovascular: Normal rate.   Pulmonary/Chest: Effort normal. No respiratory distress.  Musculoskeletal: Normal range of motion.  Neurological: He is alert and oriented to person, place, and time.  Skin: Skin is warm and dry.  Small interdigital (R) lesions. Small papular lesions with excoriation in lumbar spine and buttock region.   Psychiatric: He has a normal mood and affect. His behavior is normal.    ED Course  Procedures DIAGNOSTIC STUDIES: Oxygen Saturation is 98% on RA, normal by my interpretation.    COORDINATION OF CARE: 12:28 PM-Discussed treatment plan which includes D/C pt with Atarax, Kenalog and [place]. Pt agreed to plan.   Labs Review Labs Reviewed - No data to display Imaging Review No results found.  EKG Interpretation   None       MDM   1. Rash   2. Scabies    Patient rash consistent with scabies infection. No signs of infection. Patient will be given Elimite cream and sxs treatment. Repeat in one week and home care instructions. Return precautions discussed.  I personally performed the services described in this documentation, which was scribed in my presence. The recorded information has been reviewed and is accurate.     Arthor Captain, PA-C 12/01/12 1901

## 2012-12-02 NOTE — ED Provider Notes (Signed)
Medical screening examination/treatment/procedure(s) were performed by non-physician practitioner and as supervising physician I was immediately available for consultation/collaboration.     Geoffery Lyons, MD 12/02/12 [phone]

## 2014-03-21 ENCOUNTER — Inpatient Hospital Stay (HOSPITAL_COMMUNITY)
Admission: EM | Admit: 2014-03-21 | Discharge: 2014-03-24 | DRG: 291 | Disposition: A | Discharge status: Home / Self Care | Payer: Self-pay | Attending: Internal Medicine | Admitting: Internal Medicine

## 2014-03-21 ENCOUNTER — Encounter (HOSPITAL_COMMUNITY): Payer: Self-pay

## 2014-03-21 DIAGNOSIS — Z9119 Patient's noncompliance with other medical treatment and regimen: Secondary | ICD-10-CM | POA: Diagnosis present

## 2014-03-21 DIAGNOSIS — I5022 Chronic systolic (congestive) heart failure: Secondary | ICD-10-CM | POA: Insufficient documentation

## 2014-03-21 DIAGNOSIS — I428 Other cardiomyopathies: Secondary | ICD-10-CM

## 2014-03-21 DIAGNOSIS — J9601 Acute respiratory failure with hypoxia: Secondary | ICD-10-CM

## 2014-03-21 DIAGNOSIS — Z79899 Other long term (current) drug therapy: Secondary | ICD-10-CM

## 2014-03-21 DIAGNOSIS — J810 Acute pulmonary edema: Secondary | ICD-10-CM | POA: Insufficient documentation

## 2014-03-21 DIAGNOSIS — Z880 Allergy status to penicillin: Secondary | ICD-10-CM

## 2014-03-21 DIAGNOSIS — Z7982 Long term (current) use of aspirin: Secondary | ICD-10-CM

## 2014-03-21 DIAGNOSIS — I5021 Acute systolic (congestive) heart failure: Principal | ICD-10-CM | POA: Diagnosis present

## 2014-03-21 DIAGNOSIS — I119 Hypertensive heart disease without heart failure: Secondary | ICD-10-CM

## 2014-03-21 DIAGNOSIS — I160 Hypertensive urgency: Secondary | ICD-10-CM | POA: Diagnosis present

## 2014-03-21 DIAGNOSIS — F1110 Opioid abuse, uncomplicated: Secondary | ICD-10-CM | POA: Diagnosis present

## 2014-03-21 DIAGNOSIS — F1910 Other psychoactive substance abuse, uncomplicated: Secondary | ICD-10-CM | POA: Diagnosis present

## 2014-03-21 DIAGNOSIS — R51 Headache: Secondary | ICD-10-CM | POA: Diagnosis present

## 2014-03-21 DIAGNOSIS — I110 Hypertensive heart disease with heart failure: Secondary | ICD-10-CM | POA: Diagnosis present

## 2014-03-21 DIAGNOSIS — Z8249 Family history of ischemic heart disease and other diseases of the circulatory system: Secondary | ICD-10-CM

## 2014-03-21 DIAGNOSIS — F17210 Nicotine dependence, cigarettes, uncomplicated: Secondary | ICD-10-CM | POA: Diagnosis present

## 2014-03-21 DIAGNOSIS — E876 Hypokalemia: Secondary | ICD-10-CM | POA: Diagnosis present

## 2014-03-21 DIAGNOSIS — F1410 Cocaine abuse, uncomplicated: Secondary | ICD-10-CM | POA: Diagnosis present

## 2014-03-21 DIAGNOSIS — D72829 Elevated white blood cell count, unspecified: Secondary | ICD-10-CM | POA: Diagnosis present

## 2014-03-21 DIAGNOSIS — I43 Cardiomyopathy in diseases classified elsewhere: Secondary | ICD-10-CM | POA: Diagnosis present

## 2014-03-21 DIAGNOSIS — R9431 Abnormal electrocardiogram [ECG] [EKG]: Secondary | ICD-10-CM | POA: Diagnosis present

## 2014-03-21 DIAGNOSIS — I429 Cardiomyopathy, unspecified: Secondary | ICD-10-CM | POA: Diagnosis present

## 2014-03-21 DIAGNOSIS — R109 Unspecified abdominal pain: Secondary | ICD-10-CM

## 2014-03-21 HISTORY — DX: Headache, unspecified: R51.9

## 2014-03-21 HISTORY — DX: Headache: R51

## 2014-03-21 NOTE — ED Notes (Signed)
Pt complains of being short of breath for three days, a dry cough and trouble taking deep breaths

## 2014-03-22 ENCOUNTER — Inpatient Hospital Stay (HOSPITAL_COMMUNITY): Payer: Medicaid Other

## 2014-03-22 ENCOUNTER — Emergency Department (HOSPITAL_COMMUNITY): Payer: Self-pay

## 2014-03-22 ENCOUNTER — Encounter (HOSPITAL_COMMUNITY): Payer: Self-pay | Admitting: *Deleted

## 2014-03-22 DIAGNOSIS — I509 Heart failure, unspecified: Secondary | ICD-10-CM

## 2014-03-22 DIAGNOSIS — J811 Chronic pulmonary edema: Secondary | ICD-10-CM | POA: Insufficient documentation

## 2014-03-22 DIAGNOSIS — I10 Essential (primary) hypertension: Secondary | ICD-10-CM

## 2014-03-22 DIAGNOSIS — F1910 Other psychoactive substance abuse, uncomplicated: Secondary | ICD-10-CM

## 2014-03-22 DIAGNOSIS — I160 Hypertensive urgency: Secondary | ICD-10-CM | POA: Diagnosis present

## 2014-03-22 DIAGNOSIS — J810 Acute pulmonary edema: Secondary | ICD-10-CM | POA: Insufficient documentation

## 2014-03-22 DIAGNOSIS — I5022 Chronic systolic (congestive) heart failure: Secondary | ICD-10-CM | POA: Insufficient documentation

## 2014-03-22 LAB — I-STAT CHEM 8, ED
BUN: 13 mg/dL (ref 6–23)
CHLORIDE: 109 mmol/L (ref 96–112)
CREATININE: 1 mg/dL (ref 0.50–1.35)
Calcium, Ion: 1.09 mmol/L — ABNORMAL LOW (ref 1.12–1.23)
Glucose, Bld: 105 mg/dL — ABNORMAL HIGH (ref 70–99)
HCT: 43 % (ref 39.0–52.0)
Hemoglobin: 14.6 g/dL (ref 13.0–17.0)
POTASSIUM: 3.8 mmol/L (ref 3.5–5.1)
SODIUM: 141 mmol/L (ref 135–145)
TCO2: 19 mmol/L (ref 0–100)

## 2014-03-22 LAB — CBC
HCT: 38.9 % — ABNORMAL LOW (ref 39.0–52.0)
Hemoglobin: 13 g/dL (ref 13.0–17.0)
MCH: 31.8 pg (ref 26.0–34.0)
MCHC: 33.4 g/dL (ref 30.0–36.0)
MCV: 95.1 fL (ref 78.0–100.0)
Platelets: 224 10*3/uL (ref 150–400)
RBC: 4.09 MIL/uL — AB (ref 4.22–5.81)
RDW: 12.7 % (ref 11.5–15.5)
WBC: 6.8 10*3/uL (ref 4.0–10.5)

## 2014-03-22 LAB — TROPONIN I
Troponin I: <0.03 ng/mL (ref <0.031)
Troponin I: <0.03 ng/mL (ref <0.031)
Troponin I: 0.03 ng/mL (ref <0.031)

## 2014-03-22 LAB — RAPID URINE DRUG SCREEN, HOSP PERFORMED
Amphetamines: NOT DETECTED
BENZODIAZEPINES: NOT DETECTED
Barbiturates: NOT DETECTED
COCAINE: POSITIVE — AB
OPIATES: POSITIVE — AB
Tetrahydrocannabinol: NOT DETECTED

## 2014-03-22 LAB — PROTIME-INR
INR: 1.09 (ref 0.00–1.49)
PROTHROMBIN TIME: 14.2 s (ref 11.6–15.2)

## 2014-03-22 LAB — BRAIN NATRIURETIC PEPTIDE: B NATRIURETIC PEPTIDE 5: 1814 pg/mL — AB (ref 0.0–100.0)

## 2014-03-22 LAB — TSH: TSH: 1.57 u[IU]/mL (ref 0.350–4.500)

## 2014-03-22 LAB — I-STAT TROPONIN, ED: Troponin i, poc: 0.03 ng/mL (ref 0.00–0.08)

## 2014-03-22 MED ORDER — LORAZEPAM 2 MG/ML IJ SOLN
1.0000 mg | Freq: Once | INTRAMUSCULAR | Status: AC
  Administered 2014-03-22: 1 mg via INTRAVENOUS
  Filled 2014-03-22: qty 1

## 2014-03-22 MED ORDER — SODIUM CHLORIDE 0.9 % IJ SOLN
3.0000 mL | Freq: Two times a day (BID) | INTRAMUSCULAR | Status: DC
  Administered 2014-03-22 – 2014-03-24 (×4): 3 mL via INTRAVENOUS

## 2014-03-22 MED ORDER — ONDANSETRON HCL 4 MG/2ML IJ SOLN
4.0000 mg | Freq: Four times a day (QID) | INTRAMUSCULAR | Status: DC | PRN
  Administered 2014-03-22: 4 mg via INTRAVENOUS
  Filled 2014-03-22: qty 2

## 2014-03-22 MED ORDER — SODIUM CHLORIDE 0.9 % IJ SOLN
3.0000 mL | Freq: Two times a day (BID) | INTRAMUSCULAR | Status: DC
  Administered 2014-03-22 – 2014-03-23 (×3): 3 mL via INTRAVENOUS

## 2014-03-22 MED ORDER — ALBUTEROL SULFATE (2.5 MG/3ML) 0.083% IN NEBU
5.0000 mg | INHALATION_SOLUTION | Freq: Once | RESPIRATORY_TRACT | Status: AC
  Administered 2014-03-22: 5 mg via RESPIRATORY_TRACT
  Filled 2014-03-22: qty 6

## 2014-03-22 MED ORDER — FUROSEMIDE 10 MG/ML IJ SOLN
40.0000 mg | Freq: Every day | INTRAMUSCULAR | Status: DC
  Administered 2014-03-22: 40 mg via INTRAVENOUS
  Filled 2014-03-22: qty 4

## 2014-03-22 MED ORDER — CARVEDILOL 3.125 MG PO TABS
3.1250 mg | ORAL_TABLET | Freq: Two times a day (BID) | ORAL | Status: DC
  Administered 2014-03-22: 3.125 mg via ORAL
  Filled 2014-03-22 (×2): qty 1

## 2014-03-22 MED ORDER — HYDRALAZINE HCL 20 MG/ML IJ SOLN
10.0000 mg | INTRAMUSCULAR | Status: DC | PRN
  Administered 2014-03-22 (×2): 10 mg via INTRAVENOUS
  Filled 2014-03-22 (×2): qty 1

## 2014-03-22 MED ORDER — ENOXAPARIN SODIUM 40 MG/0.4ML ~~LOC~~ SOLN
40.0000 mg | SUBCUTANEOUS | Status: DC
  Filled 2014-03-22: qty 0.4

## 2014-03-22 MED ORDER — NITROGLYCERIN 0.4 MG/HR TD PT24
0.4000 mg | MEDICATED_PATCH | Freq: Every day | TRANSDERMAL | Status: DC
Start: 2014-03-22 — End: 2014-03-24
  Administered 2014-03-22 – 2014-03-24 (×3): 0.4 mg via TRANSDERMAL
  Filled 2014-03-22 (×3): qty 1

## 2014-03-22 MED ORDER — NITROGLYCERIN 2 % TD OINT
1.0000 [in_us] | TOPICAL_OINTMENT | Freq: Once | TRANSDERMAL | Status: AC
  Administered 2014-03-22: 1 [in_us] via TOPICAL
  Filled 2014-03-22: qty 30

## 2014-03-22 MED ORDER — IPRATROPIUM BROMIDE 0.02 % IN SOLN
0.5000 mg | Freq: Once | RESPIRATORY_TRACT | Status: AC
  Administered 2014-03-22: 0.5 mg via RESPIRATORY_TRACT
  Filled 2014-03-22: qty 2.5

## 2014-03-22 MED ORDER — LISINOPRIL 5 MG PO TABS
5.0000 mg | ORAL_TABLET | Freq: Every day | ORAL | Status: DC
  Administered 2014-03-22: 5 mg via ORAL
  Filled 2014-03-22 (×2): qty 1

## 2014-03-22 MED ORDER — ASPIRIN EC 325 MG PO TBEC
325.0000 mg | DELAYED_RELEASE_TABLET | Freq: Every day | ORAL | Status: DC
  Administered 2014-03-22 – 2014-03-24 (×3): 325 mg via ORAL
  Filled 2014-03-22 (×3): qty 1

## 2014-03-22 MED ORDER — ALBUTEROL SULFATE (2.5 MG/3ML) 0.083% IN NEBU: 5.0000 mg | INHALATION_SOLUTION | Freq: Once | RESPIRATORY_TRACT | Status: DC

## 2014-03-22 MED ORDER — ONDANSETRON HCL 4 MG PO TABS: 4.0000 mg | ORAL_TABLET | Freq: Four times a day (QID) | ORAL | Status: DC | PRN

## 2014-03-22 MED ORDER — ACETAMINOPHEN 650 MG RE SUPP: 650.0000 mg | Freq: Four times a day (QID) | RECTAL | Status: DC | PRN

## 2014-03-22 MED ORDER — ACETAMINOPHEN 325 MG PO TABS
650.0000 mg | ORAL_TABLET | Freq: Four times a day (QID) | ORAL | Status: DC | PRN
  Administered 2014-03-22: 650 mg via ORAL
  Filled 2014-03-22 (×2): qty 2

## 2014-03-22 MED ORDER — ONDANSETRON HCL 4 MG/2ML IJ SOLN
4.0000 mg | INTRAMUSCULAR | Status: AC
  Administered 2014-03-22: 4 mg via INTRAVENOUS
  Filled 2014-03-22: qty 2

## 2014-03-22 MED ORDER — POTASSIUM CHLORIDE CRYS ER 10 MEQ PO TBCR
10.0000 meq | EXTENDED_RELEASE_TABLET | Freq: Every day | ORAL | Status: DC
  Administered 2014-03-22 – 2014-03-24 (×3): 10 meq via ORAL
  Filled 2014-03-22 (×3): qty 1

## 2014-03-22 MED ORDER — ALBUTEROL SULFATE (2.5 MG/3ML) 0.083% IN NEBU
INHALATION_SOLUTION | RESPIRATORY_TRACT | Status: AC
Start: 2014-03-22 — End: 2014-03-22
  Filled 2014-03-22: qty 6

## 2014-03-22 MED ORDER — MORPHINE SULFATE 4 MG/ML IJ SOLN
4.0000 mg | Freq: Once | INTRAMUSCULAR | Status: AC
  Administered 2014-03-22: 4 mg via INTRAVENOUS
  Filled 2014-03-22: qty 1

## 2014-03-22 MED ORDER — FUROSEMIDE 10 MG/ML IJ SOLN
40.0000 mg | INTRAMUSCULAR | Status: AC
  Administered 2014-03-22: 40 mg via INTRAVENOUS
  Filled 2014-03-22: qty 4

## 2014-03-22 NOTE — Progress Notes (Signed)
Echocardiogram 2D Echocardiogram has been performed.  [name] 03/22/2014, 10:57 AM

## 2014-03-22 NOTE — Progress Notes (Signed)
[facility] PROGRESS NOTE  Filed Weights   03/21/14 2347 03/22/14 0443  Weight: 68.04 kg (150 lb) 69.31 kg (152 lb 12.8 oz)        Intake/Output Summary (Last 24 hours) at 03/22/14 0855 Last data filed at 03/22/14 0428  Gross per 24 hour  Intake      0 ml  Output   1675 ml  Net  -1675 ml     Assessment/Plan: New onset CHF (congestive heart failure) - Start on IV Lasix with good diuresis, he does admit - UDS is pending. Initial set of cardiac enzymes negative 1. - 2-D echo is pending. Agree with low-dose lisinopril.  Hypertensive urgency - Started on lisinopril and IV Lasix, with moderate blood pressure response.  Polysubstance abuse - UDS pending.    Code Status: full Family Communication: none  Disposition Plan: cont inpatietn until diuresed.   Consultants:  none  Procedures: ECHO: pending  Antibiotics:  None  HPI/Subjective: He relates his shortness of breath is better. Denies any chest pain.  Objective: Filed Vitals:   03/22/14 0300 03/22/14 0330 03/22/14 0426 03/22/14 0443  BP: 177/125 170/128 163/117 151/109  Pulse: 98 97 77 77  Temp:    97.6 F (36.4 C)  TempSrc:    Oral  Resp: 26 23 18 20   Height:    5\' 8"  (1.727 m)  Weight:    69.31 kg (152 lb 12.8 oz)  SpO2: 95% 97% 96% 92%     Exam:  General: Alert, awake, oriented x3, in no acute distress.  HEENT: No bruits, no goiter. Positive JVD Heart: Regular rate and rhythm. Lungs: Good air movement, clear.  Abdomen: Soft, nontender, nondistended, positive bowel sounds.  Neuro: Grossly intact, nonfocal.   Data Reviewed: Basic Metabolic Panel:  Recent Labs Lab 03/22/14 0159  NA 141  K 3.8  CL 109  GLUCOSE 105*  BUN 13  CREATININE 1.00   Liver Function Tests: No results for input(s): AST, ALT, ALKPHOS, BILITOT, PROT, ALBUMIN in the last 168 hours. No results for input(s): LIPASE, AMYLASE in the last 168 hours. No results for input(s): AMMONIA in the last 168  hours. CBC:  Recent Labs Lab 03/22/14 0147 03/22/14 0159  WBC 6.8  --   HGB 13.0 14.6  HCT 38.9* 43.0  MCV 95.1  --   PLT 224  --    Cardiac Enzymes: No results for input(s): CKTOTAL, CKMB, CKMBINDEX, TROPONINI in the last 168 hours. BNP (last 3 results)  Recent Labs  03/22/14 0232  BNP 1814.0*    ProBNP (last 3 results) No results for input(s): PROBNP in the last 8760 hours.  CBG: No results for input(s): GLUCAP in the last 168 hours.  No results found for this or any previous visit (from the past 240 hour(s)).   Studies: Dg Chest 2 View  03/22/2014   CLINICAL DATA:  Shortness of breath for 3 days  EXAM: CHEST  2 VIEW  COMPARISON:  03/26/2007  FINDINGS: Mild cardiomegaly, new or increased from 2009. Aortic and hilar contours are negative. There is interstitial coarsening with occasional Kerley line and small pleural effusions. No convincing pneumonia. No pneumothorax.  IMPRESSION: Mild CHF.   Electronically Signed   By: Marnee Spring M.D.   On: 03/22/2014 02:24    Scheduled Meds: . aspirin EC  325 mg Oral Daily  . enoxaparin (LOVENOX) injection  40 mg Subcutaneous Q24H  . furosemide  40 mg Intravenous Daily  . lisinopril  5 mg Oral Daily  .  nitroGLYCERIN  0.4 mg Transdermal Daily  . potassium chloride  10 mEq Oral Daily  . sodium chloride  3 mL Intravenous Q12H  . sodium chloride  3 mL Intravenous Q12H   Continuous Infusions:    Marinda Elk  [facility] Pager [phone] If 7PM-7AM, please contact night-coverage at [email or web address], password [facility] 03/22/2014, 8:55 AM  LOS: 0 days

## 2014-03-22 NOTE — Care Management Note (Addendum)
    Page 1 of 1   03/24/2014     11:37:29 AM CARE MANAGEMENT NOTE 03/24/2014  Patient:  [name], [name]   Account Number:  [id number]  Date Initiated:  03/22/2014  Documentation initiated by:  Lanier Clam  Subjective/Objective Assessment:   49 y/o m admitted w/CHF.     Action/Plan:   From home.   Anticipated DC Date:  03/24/2014   Anticipated DC Plan:  HOME/SELF CARE      DC Planning Services  CM consult  [facility]      Choice offered to / List presented to:             Status of service:  Completed, signed off Medicare Important Message given?   (If response is "NO", the following Medicare IM given date fields will be blank) Date Medicare IM given:   Medicare IM given by:   Date Additional Medicare IM given:   Additional Medicare IM given by:    Discharge Disposition:  HOME/SELF CARE  Per UR Regulation:  Reviewed for med. necessity/level of care/duration of stay  If discussed at Long Length of Stay Meetings, dates discussed:    Comments:  03/24/14 Lanier Clam RN BSN NCM [phone] PCP hospital f/u appt set w/[facility]. Patient voiced understanding.Patient able to afford meds.No further d/c needs.  03/22/14 Lanier Clam RN BSN NCM [phone] Will provide resources for health insurance, pcp-encourage [facility],$[facility] med list.

## 2014-03-22 NOTE — H&P (Signed)
[facility] History and Physical  [name] EML:[id number] DOB: Aug 13, 1965 DOA: 03/21/2014  Referring physician: ER physician. PCP: No PCP Per Patient  Chief Complaint: Shortness of breath.  HPI: [name] is a 49 y.o. male with history of hypertension has not been taking medications for many years now, polysubstance abuse including cocaine presents to the ER because of shortness of breath. Patient states over the last 4 days he has been having increasing shortness of breath on exertion and also on lying down. Denies any fever or chills prior to cough. Patient gets some nonspecific pain around his lower ribs. Chest x-ray shows congestion and BNP levels were elevated. Patient is also found to have elevated blood pressure. Patient was given Lasix 40 mg IV and nitroglycerin patch was placed and admitted for further workup of his CHF with unknown EF. Patient denies any nausea vomiting but does have some abdominal discomfort with bloating sensation. Denies any diarrhea.   Review of Systems: As presented in the history of presenting illness, rest negative.  Past Medical History  Diagnosis Date  . Hypertension   . Headache    Past Surgical History  Procedure Laterality Date  . No past surgeries     Social History:  reports that he has been smoking Cigarettes.  He has a 10 pack-year smoking history. He does not have any smokeless tobacco history on file. He reports that he drinks alcohol. He reports that he does not use illicit drugs. Where does patient live home. Can patient participate in ADLs? Yes.  Allergies  Allergen Reactions  . Penicillins Other (See Comments)    Childhood     Family History:  Family History  Problem Relation Age of Onset  . Hypertension Mother       Prior to Admission medications   Medication Sig Start Date End Date Taking? Authorizing Provider  acetaminophen (TYLENOL) 650 MG CR tablet Take 650 mg by mouth every 8 (eight) hours as needed  for pain.   Yes Historical Provider, MD  Aspirin-Caffeine 845-65 MG PACK Take 1 packet by mouth every 4 (four) hours as needed (pain).   Yes Historical Provider, MD  Multiple Vitamin (MULTIVITAMIN WITH MINERALS) TABS tablet Take 1 tablet by mouth daily.   Yes Historical Provider, MD  DiphenhydrAMINE HCl (DIPHEDRYL PO) Take 2 tablets by mouth daily as needed (itching).    Historical Provider, MD  hydrocortisone cream 1 % Apply 1 application topically 2 (two) times daily as needed for itching.    Historical Provider, MD  hydrOXYzine (ATARAX/VISTARIL) 25 MG tablet Take 1 tablet (25 mg total) by mouth every 6 (six) hours. Patient not taking: Reported on 03/22/2014 12/01/12   Arthor Captain, PA-C  permethrin (ELIMITE) 5 % cream Apply form the neck down before bed. Shower and remove in the morning. May repeat once 7 days later. Patient not taking: Reported on 03/22/2014 12/01/12   Arthor Captain, PA-C  triamcinolone cream (KENALOG) 0.1 % Apply 1 application topically 2 (two) times daily. Patient not taking: Reported on 03/22/2014 12/01/12   Arthor Captain, PA-C    Physical Exam: Filed Vitals:   03/22/14 0300 03/22/14 0330 03/22/14 0426 03/22/14 0443  BP: 177/125 170/128 163/117 151/109  Pulse: 98 97 77 77  Temp:    97.6 F (36.4 C)  TempSrc:    Oral  Resp: 26 23 18 20   Height:    5\' 8"  (1.727 m)  Weight:    69.31 kg (152 lb 12.8 oz)  SpO2: 95% 97%  96% 92%     General:  Moderately built and nourished.  Eyes: Anicteric no pallor.  ENT: No discharge from ears eyes nose and mouth.  Neck: Mildly elevated JVD no mass felt.  Cardiovascular: S1 and S2 heard.  Respiratory: No rhonchi or crepitations.  Abdomen: Mildly distended no guarding or rigidity.  Skin: No rash.  Musculoskeletal: No edema.  Psychiatric: Appears normal.  Neurologic: Alert awake oriented to time place and person. Moves all extremities.  Labs on Admission:  Basic Metabolic Panel:  Recent Labs Lab 03/22/14 0159   NA 141  K 3.8  CL 109  GLUCOSE 105*  BUN 13  CREATININE 1.00   Liver Function Tests: No results for input(s): AST, ALT, ALKPHOS, BILITOT, PROT, ALBUMIN in the last 168 hours. No results for input(s): LIPASE, AMYLASE in the last 168 hours. No results for input(s): AMMONIA in the last 168 hours. CBC:  Recent Labs Lab 03/22/14 0147 03/22/14 0159  WBC 6.8  --   HGB 13.0 14.6  HCT 38.9* 43.0  MCV 95.1  --   PLT 224  --    Cardiac Enzymes: No results for input(s): CKTOTAL, CKMB, CKMBINDEX, TROPONINI in the last 168 hours.  BNP (last 3 results)  Recent Labs  03/22/14 0232  BNP 1814.0*    ProBNP (last 3 results) No results for input(s): PROBNP in the last 8760 hours.  CBG: No results for input(s): GLUCAP in the last 168 hours.  Radiological Exams on Admission: Dg Chest 2 View  03/22/2014   CLINICAL DATA:  Shortness of breath for 3 days  EXAM: CHEST  2 VIEW  COMPARISON:  03/26/2007  FINDINGS: Mild cardiomegaly, new or increased from 2009. Aortic and hilar contours are negative. There is interstitial coarsening with occasional Kerley line and small pleural effusions. No convincing pneumonia. No pneumothorax.  IMPRESSION: Mild CHF.   Electronically Signed   By: Marnee Spring M.D.   On: 03/22/2014 02:24    EKG: Independently reviewed. Normal sinus rhythm with LVH.  Assessment/Plan Principal Problem:   CHF (congestive heart failure) Active Problems:   Hypertensive urgency   Polysubstance abuse   1. CHF knee onset unspecified EF - at this time we will continue with Lasix 40 mg IV daily along with nitroglycerin patch. Check 2-D echo for EF. Cycle cardiac markers patient has noticed the chest pain. Aspirin. I have added lisinopril 5 mg by mouth daily. Consult cardiology as patient has new onset CHF. 2. Hypertensive urgency - probably contributing to patient's CHF. At this time I have have added lisinopril 5 mg daily along with when necessary IV hydralazine and nitroglycerin  patch. Closely follow blood pressure trends. Patient is also on Lasix. 3. Polysubstance abuse - including cocaine and tobacco abuse. Patient was advised to quit these habits. Patient states he used to drink alcohol daily but quit this January. 4. Abdominal discomfort - patient states his abdominal pain is very distended and feels uncomfortable. Check LFTs and sonogram of abdomen.   DVT Prophylaxis Lovenox.  Code Status: Full code.  Family Communication: None.  Disposition Plan: Admit to inpatient.    Monseratt Ledin N. [facility] Pager [phone].  If 7PM-7AM, please contact night-coverage [email or web address] Password [facility] 03/22/2014, 6:28 AM

## 2014-03-22 NOTE — ED Provider Notes (Signed)
CSN: [id number]     Arrival date & time 03/21/14  2337 History   First MD Initiated Contact with Patient 03/22/14 0055     Chief Complaint  Patient presents with  . Shortness of Breath   (Consider location/radiation/quality/duration/timing/severity/associated sxs/prior Treatment) HPI  [name] is a 49 yo male with a history of hypertension, but not currently taking any bp meds, presenting with report of shortness of breath and cough. He states he began having runny nose, congestion and dry cough last week. He states he's noticed the last 4 days that he feels like he needs to cough to get up mucus but nothing comes up. The last 2 days he feels more short of breath with  exertion. He reports bilateral chest tightness, worse with exertion. He denies fevers, productive cough, leg swelling, chills, nausea or vomiting.   Past Medical History  Diagnosis Date  . Hypertension    History reviewed. No pertinent past surgical history. History reviewed. No pertinent family history. History  Substance Use Topics  . Smoking status: Current Every Day Smoker  . Smokeless tobacco: Not on file  . Alcohol Use: Yes    Review of Systems  Constitutional: Negative for fever and chills.  HENT: Negative for sore throat.   Eyes: Negative for visual disturbance.  Respiratory: Positive for cough, chest tightness and shortness of breath.   Cardiovascular: Negative for chest pain and leg swelling.  Gastrointestinal: Negative for nausea, vomiting and diarrhea.  Genitourinary: Negative for dysuria.  Musculoskeletal: Negative for myalgias.  Skin: Negative for rash.  Neurological: Negative for weakness, numbness and headaches.    Allergies  Penicillins  Home Medications   Prior to Admission medications   Medication Sig Start Date End Date Taking? Authorizing Provider  acetaminophen (TYLENOL) 650 MG CR tablet Take 650 mg by mouth every 8 (eight) hours as needed for pain.   Yes Historical Provider,  MD  Aspirin-Caffeine 845-65 MG PACK Take 1 packet by mouth every 4 (four) hours as needed (pain).   Yes Historical Provider, MD  Multiple Vitamin (MULTIVITAMIN WITH MINERALS) TABS tablet Take 1 tablet by mouth daily.   Yes Historical Provider, MD  DiphenhydrAMINE HCl (DIPHEDRYL PO) Take 2 tablets by mouth daily as needed (itching).    Historical Provider, MD  hydrocortisone cream 1 % Apply 1 application topically 2 (two) times daily as needed for itching.    Historical Provider, MD  hydrOXYzine (ATARAX/VISTARIL) 25 MG tablet Take 1 tablet (25 mg total) by mouth every 6 (six) hours. Patient not taking: Reported on 03/22/2014 12/01/12   Arthor Captain, PA-C  permethrin (ELIMITE) 5 % cream Apply form the neck down before bed. Shower and remove in the morning. May repeat once 7 days later. Patient not taking: Reported on 03/22/2014 12/01/12   Arthor Captain, PA-C  triamcinolone cream (KENALOG) 0.1 % Apply 1 application topically 2 (two) times daily. Patient not taking: Reported on 03/22/2014 12/01/12   Arthor Captain, PA-C   BP 183/139 mmHg  Pulse 100  Temp(Src) 98 F (36.7 C) (Oral)  Resp 20  Ht  (1.727 m)  Wt 150 lb (68.04 kg)  BMI 22.81 kg/m2  SpO2 94% Physical Exam  Constitutional: He appears well-developed and well-nourished. No distress.  HENT:  Head: Normocephalic and atraumatic.  Mouth/Throat: Oropharynx is clear and moist. No oropharyngeal exudate.  Eyes: Conjunctivae are normal.  Neck: Neck supple.  Cardiovascular: Normal rate, regular rhythm and intact distal pulses.  Exam reveals no gallop and no friction rub.  No murmur heard. Pulmonary/Chest: Effort normal. No respiratory distress. He has wheezes in the right middle field, the right lower field, the left middle field and the left lower field. He has no rhonchi. He has rales in the right lower field and the left lower field. He exhibits no tenderness.  Abdominal: Soft. There is no tenderness.  Musculoskeletal: He exhibits no  tenderness.  Lymphadenopathy:    He has no cervical adenopathy.  Neurological: He is alert.  Skin: Skin is warm and dry. No rash noted. He is not diaphoretic.  Psychiatric: He has a normal mood and affect.  Nursing note and vitals reviewed.   ED Course  Procedures (including critical care time) Labs Review Labs Reviewed  CBC - Abnormal; Notable for the following:    RBC 4.09 (*)    HCT 38.9 (*)    All other components within normal limits  BRAIN NATRIURETIC PEPTIDE - Abnormal; Notable for the following:    B Natriuretic Peptide 1814.0 (*)    All other components within normal limits  I-STAT CHEM 8, ED - Abnormal; Notable for the following:    Glucose, Bld 105 (*)    Calcium, Ion 1.09 (*)    All other components within normal limits  I-STAT TROPOININ, ED    Imaging Review Dg Chest 2 View  03/22/2014   CLINICAL DATA:  Shortness of breath for 3 days  EXAM: CHEST  2 VIEW  COMPARISON:  03/26/2007  FINDINGS: Mild cardiomegaly, new or increased from 2009. Aortic and hilar contours are negative. There is interstitial coarsening with occasional Kerley line and small pleural effusions. No convincing pneumonia. No pneumothorax.  IMPRESSION: Mild CHF.   Electronically Signed   By: Marnee Spring M.D.   On: 03/22/2014 02:24     EKG Interpretation None      Sinus rhythm Probable left atrial enlargement LVH with IVCD, LAD and secondary repol abnrm Borderline prolonged QT interval Vent. rate 91 BPM PR interval 155 ms QRS duration 118 ms QT/QTc 391/481 ms P-R-T axes 73 -65 212  MDM   Final diagnoses:  Acute pulmonary edema   49 yo with shortness of breath and coughing worse on exertion. CBC, I-stat 8, I-stat troponin, BNP, CXR and EKG. Morphine and albuterol neb. Discussed case with Dr. Ranae Palms.  Lung exam and CXR consistent with pulmonary edema.  Lasix IV and Nitropaste ordered. CBC, I-stat 8, Troponin negative for significant abnormality. BNP elevated to 1814. EKG shows new  T-wave inversions in V4 and worsening T-wave inversions in V5 and V6.   On re-eval, Pt resting comfortably but hypoxic to 87% on room air. Pt placed on 3 LNC.   3:00 AM: Consulted Dr. Lars Masson (hospitalist) regarding pt's status.  Pt accepted to telemetry.  Pt was updated with results and plan. He appears reasonably stabilized for admission considering the current resources, flow, and capabilities available in the ED at this time, and I doubt any further screening and/or treatment in the ED prior to admission.   Filed Vitals:   03/22/14 0103 03/22/14 0116 03/22/14 0148 03/22/14 0230  BP: 175/134 168/121 168/119 166/107  Pulse: 96 95 87 95  Temp:  97.5 F (36.4 C)    TempSrc:  Oral    Resp: 28 26 22 24   Height:      Weight:      SpO2: 96% 95% 100% 90%   Meds given in ED:  Medications  albuterol (PROVENTIL) (2.5 MG/3ML) 0.083% nebulizer solution 5 mg (5 mg Nebulization Given 03/22/14  0134)  ipratropium (ATROVENT) nebulizer solution 0.5 mg (0.5 mg Nebulization Given 03/22/14 0134)  morphine 4 MG/ML injection 4 mg (4 mg Intravenous Given 03/22/14 0146)  ondansetron (ZOFRAN) injection 4 mg (4 mg Intravenous Given 03/22/14 0146)  albuterol (PROVENTIL) (2.5 MG/3ML) 0.083% nebulizer solution (  Duplicate 03/22/14 0144)  furosemide (LASIX) injection 40 mg (40 mg Intravenous Given 03/22/14 0243)  nitroGLYCERIN (NITROGLYN) 2 % ointment 1 inch (1 inch Topical Given 03/22/14 0314)    New Prescriptions   No medications on file       Harle Battiest, NP 03/22/14 1520  Loren Racer, MD 03/22/14 2315

## 2014-03-23 DIAGNOSIS — I5021 Acute systolic (congestive) heart failure: Principal | ICD-10-CM

## 2014-03-23 DIAGNOSIS — I119 Hypertensive heart disease without heart failure: Secondary | ICD-10-CM

## 2014-03-23 DIAGNOSIS — I43 Cardiomyopathy in diseases classified elsewhere: Secondary | ICD-10-CM

## 2014-03-23 DIAGNOSIS — J9601 Acute respiratory failure with hypoxia: Secondary | ICD-10-CM

## 2014-03-23 LAB — BASIC METABOLIC PANEL
ANION GAP: 7 (ref 5–15)
BUN: 10 mg/dL (ref 6–23)
CHLORIDE: 104 mmol/L (ref 96–112)
CO2: 26 mmol/L (ref 19–32)
CREATININE: 1.13 mg/dL (ref 0.50–1.35)
Calcium: 9.2 mg/dL (ref 8.4–10.5)
GFR calc Af Amer: 87 mL/min — ABNORMAL LOW (ref >90)
GFR calc non Af Amer: 75 mL/min — ABNORMAL LOW (ref >90)
Glucose, Bld: 100 mg/dL — ABNORMAL HIGH (ref 70–99)
POTASSIUM: 3.4 mmol/L — AB (ref 3.5–5.1)
SODIUM: 137 mmol/L (ref 135–145)

## 2014-03-23 LAB — CBC
HCT: 41.1 % (ref 39.0–52.0)
HEMOGLOBIN: 13.7 g/dL (ref 13.0–17.0)
MCH: 31.7 pg (ref 26.0–34.0)
MCHC: 33.3 g/dL (ref 30.0–36.0)
MCV: 95.1 fL (ref 78.0–100.0)
Platelets: 227 10*3/uL (ref 150–400)
RBC: 4.32 MIL/uL (ref 4.22–5.81)
RDW: 12.9 % (ref 11.5–15.5)
WBC: 13.9 10*3/uL — ABNORMAL HIGH (ref 4.0–10.5)

## 2014-03-23 MED ORDER — POTASSIUM CHLORIDE CRYS ER 20 MEQ PO TBCR
40.0000 meq | EXTENDED_RELEASE_TABLET | Freq: Two times a day (BID) | ORAL | Status: AC
  Administered 2014-03-23 (×2): 40 meq via ORAL
  Filled 2014-03-23 (×2): qty 2

## 2014-03-23 MED ORDER — LISINOPRIL 20 MG PO TABS
20.0000 mg | ORAL_TABLET | Freq: Every day | ORAL | Status: DC
  Administered 2014-03-24: 20 mg via ORAL
  Filled 2014-03-23: qty 1

## 2014-03-23 MED ORDER — TRAMADOL HCL 50 MG PO TABS
50.0000 mg | ORAL_TABLET | Freq: Four times a day (QID) | ORAL | Status: DC | PRN
  Administered 2014-03-23 (×2): 50 mg via ORAL
  Filled 2014-03-23 (×2): qty 1

## 2014-03-23 MED ORDER — LISINOPRIL 10 MG PO TABS
10.0000 mg | ORAL_TABLET | Freq: Every day | ORAL | Status: DC
  Administered 2014-03-23: 10 mg via ORAL
  Filled 2014-03-23: qty 1

## 2014-03-23 MED ORDER — CARVEDILOL 6.25 MG PO TABS
6.2500 mg | ORAL_TABLET | Freq: Two times a day (BID) | ORAL | Status: DC
  Administered 2014-03-23 – 2014-03-24 (×2): 6.25 mg via ORAL
  Filled 2014-03-23 (×2): qty 1

## 2014-03-23 MED ORDER — FUROSEMIDE 10 MG/ML IJ SOLN
40.0000 mg | Freq: Two times a day (BID) | INTRAMUSCULAR | Status: DC
  Administered 2014-03-23: 40 mg via INTRAVENOUS
  Filled 2014-03-23 (×2): qty 4

## 2014-03-23 NOTE — Progress Notes (Addendum)
[facility] PROGRESS NOTE  Filed Weights   03/21/14 2347 03/22/14 0443 03/23/14 0540  Weight: 68.04 kg (150 lb) 69.31 kg (152 lb 12.8 oz) 66.543 kg (146 lb 11.2 oz)        Intake/Output Summary (Last 24 hours) at 03/23/14 0844 Last data filed at 03/22/14 1700  Gross per 24 hour  Intake    480 ml  Output      0 ml  Net    480 ml     Assessment/Plan: New onset acute systolic heart failure/acute respiratory failure with hypoxia/acute pulmonary edema: - Start on IV Lasix with good diuresis, he does admit to using cocaine. - UDS is for cocaine and opiates Initial set of cardiac enzymes negative 3. - 2-D echo as below low. Continue lisinopril and Coreg. - Creatinine continues to be stable. - consulted cardiology.  Hypertensive urgency - I will go ahead and increase Coreg and lisinopril.  Polysubstance abuse - UDS as above   Leukocytosis: - Unclear etiology has remained afebrile, no dysuria, cough or shortness of breath. - He is complaining of some headaches. But he relates these are not new and are unchanged from previous.  Code Status: full Family Communication: none  Disposition Plan: cont inpatietn until diuresed.   Consultants:  none  Procedures: ECHO 3.2.2016: Wall thickness was increased in a pattern of mild LVH. Systolic function was severely reduced. The estimated ejection fraction was 20%. Diffuse hypokinesis. Doppler parameters are consistent with high ventricular filling pressure.  Antibiotics:  None  HPI/Subjective: No shortness of breath, Denies any chest pain.  Objective: Filed Vitals:   03/22/14 1418 03/22/14 1935 03/22/14 2035 03/23/14 0540  BP: 162/87 164/96 146/81 159/88  Pulse: 90 95  84  Temp: 98.2 F (36.8 C) 98.1 F (36.7 C)  98.4 F (36.9 C)  TempSrc: Oral Oral  Oral  Resp: Height:      Weight:    66.543 kg (146 lb 11.2 oz)  SpO2: 96% 95%  97%     Exam:  General: Alert, awake, oriented x3, in no  acute distress.  HEENT: No bruits, no goiter. Positive JVD Heart: Regular rate and rhythm. Lungs: Good air movement, clear.  Abdomen: Soft, nontender, nondistended, positive bowel sounds.  Neuro: Grossly intact, nonfocal.   Data Reviewed: Basic Metabolic Panel:  Recent Labs Lab 03/22/14 0159 03/23/14 0445  NA 141 137  K 3.8 3.4*  CL 109 104  CO2  --  26  GLUCOSE 105* 100*  BUN 13 10  CREATININE 1.00 1.13  CALCIUM  --  9.2   Liver Function Tests: No results for input(s): AST, ALT, ALKPHOS, BILITOT, PROT, ALBUMIN in the last 168 hours. No results for input(s): LIPASE, AMYLASE in the last 168 hours. No results for input(s): AMMONIA in the last 168 hours. CBC:  Recent Labs Lab 03/22/14 0147 03/22/14 0159 03/23/14 0445  WBC 6.8  --  13.9*  HGB 13.0 14.6 13.7  HCT 38.9* 43.0 41.1  MCV 95.1  --  95.1  PLT 224  --  227   Cardiac Enzymes:  Recent Labs Lab 03/22/14 0900 03/22/14 1505 03/22/14 2109  TROPONINI <0.03 <0.03 0.03   BNP (last 3 results)  Recent Labs  03/22/14 0232  BNP 1814.0*    ProBNP (last 3 results) No results for input(s): PROBNP in the last 8760 hours.  CBG: No results for input(s): GLUCAP in the last 168 hours.  No results found for this or any previous  visit (from the past 240 hour(s)).   Studies: Dg Chest 2 View  03/22/2014   CLINICAL DATA:  Shortness of breath for 3 days  EXAM: CHEST  2 VIEW  COMPARISON:  03/26/2007  FINDINGS: Mild cardiomegaly, new or increased from 2009. Aortic and hilar contours are negative. There is interstitial coarsening with occasional Kerley line and small pleural effusions. No convincing pneumonia. No pneumothorax.  IMPRESSION: Mild CHF.   Electronically Signed   By: Marnee Spring M.D.   On: 03/22/2014 02:24   US Abdomen Complete  03/22/2014   CLINICAL DATA:  Generalized abdominal pain for 4 days. Atypical chest pain.  EXAM: ULTRASOUND ABDOMEN COMPLETE  COMPARISON:  None.  FINDINGS: Gallbladder: No gallstones  or wall thickening visualized. No sonographic Murphy sign noted.  Common bile duct: Diameter: 4 mm, within normal limits.  Liver: No focal lesion identified. Within normal limits in parenchymal echogenicity.  IVC: No abnormality visualized.  Pancreas: Visualized portion unremarkable.  Spleen: Size and appearance within normal limits.  Right Kidney: Length: 11.4 cm. Echogenicity within normal limits. No mass or hydronephrosis visualized.  Left Kidney: Length: 12.2 cm. Echogenicity within normal limits. No mass or hydronephrosis visualized.  Abdominal aorta: No aneurysm visualized.  Other findings: Bilateral pleural effusions incidentally noted.  IMPRESSION: No evidence of gallstones, biliary dilatation, or other sonographic abnormality within the abdomen.  Bilateral pleural effusions incidentally noted.   Electronically Signed   By: Myles Rosenthal M.D.   On: 03/22/2014 15:25    Scheduled Meds: . aspirin EC  325 mg Oral Daily  . carvedilol  3.125 mg Oral BID WC  . furosemide  40 mg Intravenous Daily  . lisinopril  5 mg Oral Daily  . nitroGLYCERIN  0.4 mg Transdermal Daily  . potassium chloride  10 mEq Oral Daily  . sodium chloride  3 mL Intravenous Q12H  . sodium chloride  3 mL Intravenous Q12H   Continuous Infusions:    Marinda Elk  [facility] Pager [phone] If 7PM-7AM, please contact night-coverage at [email or web address], password [facility] 03/23/2014, 8:44 AM  LOS: 1 day

## 2014-03-23 NOTE — Consult Note (Signed)
Admit date: 03/21/2014 Referring Physician  Dr. Robb Matar Primary Physician  None Primary Cardiologist  None Reason for Consultation  CHF  HPI: [name] [name] is a 49 y.o. male with history of hypertension has not been taking medications for many years now, polysubstance abuse including cocaine presents to the ER because of shortness of breath. Patient states over the last 4 days he has been having increasing shortness of breath on exertion and also on lying down. Denies any fever or chills prior to cough. Patient gets some nonspecific pain around his lower ribs. Chest x-ray shows congestion and BNP levels were elevated. Patient is also found to have elevated blood pressure. Patient was given Lasix 40 mg IV and nitroglycerin patch was placed and admitted for further workup of his CHF with unknown EF. Patient denies any nausea vomiting but does have some abdominal discomfort with bloating sensation. Denies any diarrhea.   2D echo done yesterday showed severe LV dysfunction with EF 20% and mild RV dysfunction.  Cardiology is now asked to consult.       PMH:   Past Medical History  Diagnosis Date  . Hypertension   . Headache      PSH:   Past Surgical History  Procedure Laterality Date  . No past surgeries      Allergies:  Penicillins Prior to Admit Meds:   Prescriptions prior to admission  Medication Sig Dispense Refill Last Dose  . acetaminophen (TYLENOL) 650 MG CR tablet Take 650 mg by mouth every 8 (eight) hours as needed for pain.   Past Week at Unknown time  . Aspirin-Caffeine 845-65 MG PACK Take 1 packet by mouth every 4 (four) hours as needed (pain).   Past Week at Unknown time  . Multiple Vitamin (MULTIVITAMIN WITH MINERALS) TABS tablet Take 1 tablet by mouth daily.   Past Week at Unknown time  . DiphenhydrAMINE HCl (DIPHEDRYL PO) Take 2 tablets by mouth daily as needed (itching).   Past Week at Unknown time  . hydrocortisone cream 1 % Apply 1 application topically 2 (two) times  daily as needed for itching.   11/30/2012 at Unknown time  . hydrOXYzine (ATARAX/VISTARIL) 25 MG tablet Take 1 tablet (25 mg total) by mouth every 6 (six) hours. (Patient not taking: Reported on 03/22/2014) 12 tablet 0 Not Taking at Unknown time  . permethrin (ELIMITE) 5 % cream Apply form the neck down before bed. Shower and remove in the morning. May repeat once 7 days later. (Patient not taking: Reported on 03/22/2014) 60 g 0 Completed Course at Unknown time  . triamcinolone cream (KENALOG) 0.1 % Apply 1 application topically 2 (two) times daily. (Patient not taking: Reported on 03/22/2014) 30 g 0 Not Taking at Unknown time   Fam HX:    Family History  Problem Relation Age of Onset  . Hypertension Mother    Social HX:    History   Social History  . Marital Status: Single    Spouse Name: N/A  . Number of Children: N/A  . Years of Education: N/A   Occupational History  . Not on file.   Social History Main Topics  . Smoking status: Current Every Day Smoker -- 0.50 packs/day for 20 years    Types: Cigarettes  . Smokeless tobacco: Not on file  . Alcohol Use: Yes     Comment: Stopped drinking Alcohol i Feb 16 2014.  . Drug Use: No  . Sexual Activity: Not on file   Other Topics  Concern  . Not on file   Social History Narrative     ROS:  All 11 ROS were addressed and are negative except what is stated in the HPI  Physical Exam: Blood pressure 159/88, pulse 84, temperature 98.4 F (36.9 C), temperature source Oral, resp. rate 20, height 5\' 8"  (1.727 m), weight 146 lb 11.2 oz (66.543 kg), SpO2 97 %.    General: Well developed, well nourished, in no acute distress Head: Eyes PERRLA, No xanthomas.   Normal cephalic and atramatic  Lungs:   Clear bilaterally to auscultation and percussion. Heart:   HRRR S1 S2 Pulses are 2+ & equal. Abdomen: Bowel sounds are positive, abdomen soft and non-tender without masses Extremities:   No clubbing, cyanosis or edema.  DP +1 Neuro: Alert and  oriented X 3. Psych:  Good affect, responds appropriately    Labs:   Lab Results  Component Value Date   WBC 13.9* 03/23/2014   HGB 13.7 03/23/2014   HCT 41.1 03/23/2014   MCV 95.1 03/23/2014   PLT 227 03/23/2014    Recent Labs Lab 03/23/14 0445  NA 137  K 3.4*  CL 104  CO2 26  BUN 10  CREATININE 1.13  CALCIUM 9.2  GLUCOSE 100*   No results found for: PTT Lab Results  Component Value Date   INR 1.09 03/22/2014   INR 0.9 03/26/2007   Lab Results  Component Value Date   TROPONINI 0.03 03/22/2014    No results found for: CHOL No results found for: HDL No results found for: LDLCALC No results found for: TRIG No results found for: CHOLHDL No results found for: LDLDIRECT    Radiology:  Dg Chest 2 View  03/22/2014   CLINICAL DATA:  Shortness of breath for 3 days  EXAM: CHEST  2 VIEW  COMPARISON:  03/26/2007  FINDINGS: Mild cardiomegaly, new or increased from 2009. Aortic and hilar contours are negative. There is interstitial coarsening with occasional Kerley line and small pleural effusions. No convincing pneumonia. No pneumothorax.  IMPRESSION: Mild CHF.   Electronically Signed   By: Marnee Spring M.D.   On: 03/22/2014 02:24   US Abdomen Complete  03/22/2014   CLINICAL DATA:  Generalized abdominal pain for 4 days. Atypical chest pain.  EXAM: ULTRASOUND ABDOMEN COMPLETE  COMPARISON:  None.  FINDINGS: Gallbladder: No gallstones or wall thickening visualized. No sonographic Murphy sign noted.  Common bile duct: Diameter: 4 mm, within normal limits.  Liver: No focal lesion identified. Within normal limits in parenchymal echogenicity.  IVC: No abnormality visualized.  Pancreas: Visualized portion unremarkable.  Spleen: Size and appearance within normal limits.  Right Kidney: Length: 11.4 cm. Echogenicity within normal limits. No mass or hydronephrosis visualized.  Left Kidney: Length: 12.2 cm. Echogenicity within normal limits. No mass or hydronephrosis visualized.  Abdominal  aorta: No aneurysm visualized.  Other findings: Bilateral pleural effusions incidentally noted.  IMPRESSION: No evidence of gallstones, biliary dilatation, or other sonographic abnormality within the abdomen.  Bilateral pleural effusions incidentally noted.   Electronically Signed   By: Myles Rosenthal M.D.   On: 03/22/2014 15:25    EKG:  NSR with LAD, LAE and LVH with repolarization abnormality  ASSESSMENT/PLAN:  1.  Acute systolic CHF with diffuse hypokinesis and no regional wall motion abnormality favoring nonischemic DCM over ischemic.  Most likely secondary to hypertensive cardiomyopathy +/- ETOH.  EKG with marked LVH and repolarization abnormality.  He has a history of severe alcohol abuse and used to drink on  a daily bases about 10 (40oz) beers and liquor but quit in December.  BNP elevated and chest xray c/w mild CHF.  Troponin is normal.  Now on IV Lasix and is net neg 800cc.  Given history of cocaine abuse would be cautious with BB therapy.  Increase Lisinopril to  daily and follow renal function.  Would titrated ACE I as tolerated.  Continue Lasix IV.  I have had a long discussion with him about the risks of doing cocaine again especially being on a beta blocker.  I explained that he needed to be on the BB for his heart.  He says he has hardly ever done it and will not do it again.   2.  Hypertensive urgency - BP still poorly controlled.  Increase Lisinopril to  today and continue to titrate as needed for BP control.  Continue Coreg.  Will add aldactone  daily which will help with hypokalemia. 3.  Polysubstance abuse 4.  Medical noncompliance.   5.  Hypokalemia - replete 6.  Abnormal EKG with marked LVH with repol abnormality - this is most likely secondary to LVH but does have CRF so consider outpt nuclear stress test.      Quintella Reichert, MD  03/23/2014  3:41 PM

## 2014-03-23 NOTE — Progress Notes (Signed)
At beginning of shift, patient complaining of a severe headache, 8 out of 10. Says he has them every day. Vitals were taken and it showed his BP was 164/96. Apresoline PRN was given which brought BP down but patient still complaining of headache and nausea. Patient had told day RN that he had not had an alcoholic drink in one month, but seemed to be having withdrawal symptoms. NP was notified and new orders given for a one time dose of Ativan. Patient sleeping now.

## 2014-03-23 NOTE — Progress Notes (Signed)
Subjective/Objective No subjective & objective note has been filed under this hospital service for this encounter.   Scheduled Meds: . aspirin EC  325 mg Oral Daily  . carvedilol  6.25 mg Oral BID WC  . furosemide  40 mg Intravenous BID  . lisinopril  10 mg Oral Daily  . nitroGLYCERIN  0.4 mg Transdermal Daily  . potassium chloride  10 mEq Oral Daily  . potassium chloride  40 mEq Oral BID  . sodium chloride  3 mL Intravenous Q12H  . sodium chloride  3 mL Intravenous Q12H   Continuous Infusions:  PRN Meds:acetaminophen **OR** acetaminophen, hydrALAZINE, ondansetron **OR** ondansetron (ZOFRAN) IV, traMADol  Vital signs in last 24 hours: Temp:  [98 F (36.7 C)-98.4 F (36.9 C)] 98 F (36.7 C) (03/03 1544) Pulse Rate:  [73-95] 73 (03/03 1544) Resp:  [19-20] 19 (03/03 1544) BP: (146-164)/(81-100) 149/100 mmHg (03/03 1544) SpO2:  [95 %-97 %] 96 % (03/03 1544) Weight:  [146 lb 11.2 oz (66.543 kg)] 146 lb 11.2 oz (66.543 kg) (03/03 0540)  Intake/Output last 3 shifts: I/O last 3 completed shifts: In: 480 [P.O.:480] Out: 1675 [Urine:1675] Intake/Output this shift: Total I/O In: 360 [P.O.:360] Out: -   Problem Assessment/Plan No new assessment & plan notes have been filed under this hospital service since the last note was generated. Service: Cardiology

## 2014-03-24 DIAGNOSIS — I428 Other cardiomyopathies: Secondary | ICD-10-CM

## 2014-03-24 DIAGNOSIS — J9601 Acute respiratory failure with hypoxia: Secondary | ICD-10-CM

## 2014-03-24 DIAGNOSIS — I110 Hypertensive heart disease with heart failure: Secondary | ICD-10-CM

## 2014-03-24 DIAGNOSIS — I429 Cardiomyopathy, unspecified: Secondary | ICD-10-CM

## 2014-03-24 LAB — BASIC METABOLIC PANEL
ANION GAP: 10 (ref 5–15)
BUN: 14 mg/dL (ref 6–23)
CO2: 27 mmol/L (ref 19–32)
Calcium: 9.6 mg/dL (ref 8.4–10.5)
Chloride: 103 mmol/L (ref 96–112)
Creatinine, Ser: 1.18 mg/dL (ref 0.50–1.35)
GFR calc non Af Amer: 71 mL/min — ABNORMAL LOW (ref >90)
GFR, EST AFRICAN AMERICAN: 83 mL/min — AB (ref >90)
Glucose, Bld: 101 mg/dL — ABNORMAL HIGH (ref 70–99)
Potassium: 4 mmol/L (ref 3.5–5.1)
SODIUM: 140 mmol/L (ref 135–145)

## 2014-03-24 LAB — BRAIN NATRIURETIC PEPTIDE: B NATRIURETIC PEPTIDE 5: 329.1 pg/mL — AB (ref 0.0–100.0)

## 2014-03-24 MED ORDER — FUROSEMIDE 10 MG/ML IJ SOLN
60.0000 mg | Freq: Two times a day (BID) | INTRAMUSCULAR | Status: DC
  Filled 2014-03-24: qty 6

## 2014-03-24 MED ORDER — LISINOPRIL 20 MG PO TABS: 20.0000 mg | ORAL_TABLET | Freq: Every day | ORAL | Status: DC

## 2014-03-24 MED ORDER — ASPIRIN 325 MG PO TBEC: 325.0000 mg | DELAYED_RELEASE_TABLET | Freq: Every day | ORAL | Status: DC

## 2014-03-24 MED ORDER — FUROSEMIDE 40 MG PO TABS: 40.0000 mg | ORAL_TABLET | Freq: Two times a day (BID) | ORAL | Status: DC

## 2014-03-24 MED ORDER — FUROSEMIDE 10 MG/ML IJ SOLN
60.0000 mg | Freq: Once | INTRAMUSCULAR | Status: AC
  Administered 2014-03-24: 60 mg via INTRAVENOUS

## 2014-03-24 MED ORDER — FUROSEMIDE 40 MG PO TABS: 60.0000 mg | ORAL_TABLET | Freq: Once | ORAL | Status: DC

## 2014-03-24 MED ORDER — CARVEDILOL 6.25 MG PO TABS: 6.2500 mg | ORAL_TABLET | Freq: Two times a day (BID) | ORAL | Status: DC

## 2014-03-24 MED ORDER — SPIRONOLACTONE 25 MG PO TABS: 12.5000 mg | ORAL_TABLET | Freq: Every day | ORAL | Status: DC

## 2014-03-24 NOTE — Progress Notes (Signed)
SUBJECTIVE:  Feels better.  Wondering when he can go home.   OBJECTIVE:   Vitals:   Filed Vitals:   03/23/14 1544 03/23/14 2035 03/24/14 0604 03/24/14 0630  BP: 149/100 129/88 156/114 150/106  Pulse: 73 74 83   Temp: 98 F (36.7 C) 98.1 F (36.7 C) 98.3 F (36.8 C)   TempSrc: Oral Oral Oral   Resp: Height:      Weight:   143 lb 9.6 oz (65.137 kg)   SpO2: 96% 96% 99%    I&O's:   Intake/Output Summary (Last 24 hours) at 03/24/14 0835 Last data filed at 03/23/14 1845  Gross per 24 hour  Intake    720 ml  Output      0 ml  Net    720 ml   TELEMETRY: Reviewed telemetry pt in NSR:     PHYSICAL EXAM General: Well developed, well nourished, in no acute distress Head:   Normal cephalic and atramatic  Lungs:  No wheezing. Heart:   HRRR S1 S2  No JVD.   Abdomen: abdomen soft and non-tender Msk:  Back normal,  Normal strength and tone for age. Extremities:   No edema.   Neuro: Alert and oriented. Psych:  Normal affect, responds appropriately Skin: No rash   LABS: Basic Metabolic Panel:  Recent Labs  96/04/54 0445 03/24/14 0404  NA 137 140  K 3.4* 4.0  CL 104 103  CO2 26 27  GLUCOSE 100* 101*  BUN 10 14  CREATININE 1.13 1.18  CALCIUM 9.2 9.6   Liver Function Tests: No results for input(s): AST, ALT, ALKPHOS, BILITOT, PROT, ALBUMIN in the last 72 hours. No results for input(s): LIPASE, AMYLASE in the last 72 hours. CBC:  Recent Labs  03/22/14 0147 03/22/14 0159 03/23/14 0445  WBC 6.8  --  13.9*  HGB 13.0 14.6 13.7  HCT 38.9* 43.0 41.1  MCV 95.1  --  95.1  PLT 224  --  227   Cardiac Enzymes:  Recent Labs  03/22/14 0900 03/22/14 1505 03/22/14 2109  TROPONINI <0.03 <0.03 0.03   BNP: Invalid input(s): POCBNP D-Dimer: No results for input(s): DDIMER in the last 72 hours. Hemoglobin A1C: No results for input(s): HGBA1C in the last 72 hours. Fasting Lipid Panel: No results for input(s): CHOL, HDL, LDLCALC, TRIG, CHOLHDL, LDLDIRECT  in the last 72 hours. Thyroid Function Tests:  Recent Labs  03/22/14 0900  TSH 1.570   Anemia Panel: No results for input(s): VITAMINB12, FOLATE, FERRITIN, TIBC, IRON, RETICCTPCT in the last 72 hours. Coag Panel:   Lab Results  Component Value Date   INR 1.09 03/22/2014   INR 0.9 03/26/2007    RADIOLOGY: Dg Chest 2 View  03/22/2014   CLINICAL DATA:  Shortness of breath for 3 days  EXAM: CHEST  2 VIEW  COMPARISON:  03/26/2007  FINDINGS: Mild cardiomegaly, new or increased from 2009. Aortic and hilar contours are negative. There is interstitial coarsening with occasional Kerley line and small pleural effusions. No convincing pneumonia. No pneumothorax.  IMPRESSION: Mild CHF.   Electronically Signed   By: Marnee Spring M.D.   On: 03/22/2014 02:24   US Abdomen Complete  03/22/2014   CLINICAL DATA:  Generalized abdominal pain for 4 days. Atypical chest pain.  EXAM: ULTRASOUND ABDOMEN COMPLETE  COMPARISON:  None.  FINDINGS: Gallbladder: No gallstones or wall thickening visualized. No sonographic Murphy sign noted.  Common bile duct: Diameter: 4 mm, within normal limits.  Liver: No  focal lesion identified. Within normal limits in parenchymal echogenicity.  IVC: No abnormality visualized.  Pancreas: Visualized portion unremarkable.  Spleen: Size and appearance within normal limits.  Right Kidney: Length: 11.4 cm. Echogenicity within normal limits. No mass or hydronephrosis visualized.  Left Kidney: Length: 12.2 cm. Echogenicity within normal limits. No mass or hydronephrosis visualized.  Abdominal aorta: No aneurysm visualized.  Other findings: Bilateral pleural effusions incidentally noted.  IMPRESSION: No evidence of gallstones, biliary dilatation, or other sonographic abnormality within the abdomen.  Bilateral pleural effusions incidentally noted.   Electronically Signed   By: Myles Rosenthal M.D.   On: 03/22/2014 15:25      ASSESSMENT: [name]:    Most likely nonischemic cardiomyopathy related to  HTN, alcohol and cocaine abuse.   Appears euvolemic. Explained to patient that he could have recovery of LV function if he changed his lifestyle and took his medicines regularly.    Will arrange f/u with Dr. Mayford Knife.    Corky Crafts, MD  03/24/2014  8:35 AM

## 2014-03-24 NOTE — Discharge Instructions (Signed)
[name] was admitted to the Hospital on 03/21/2014 and Discharged on Discharge Date 03/24/2014 and should be excused from work/school   for 3   days starting 03/21/2014 , may return to work/school without any restrictions.  Call Lambert Keto MD, [facility] [phone] with questions.  Marinda Elk M.D on 03/24/2014,at 10:25 AM  [facility] Office  [phone]

## 2014-03-24 NOTE — Discharge Summary (Signed)
Physician Discharge Summary  [name] OBS:[id number] DOB: 02/16/1965 DOA: 03/21/2014  PCP: No PCP Per Patient  Admit date: 03/21/2014 Discharge date: 03/24/2014  Time spent: 35 minutes  Recommendations for Outpatient Follow-up:  1. Follow-up with cardiology in 1 week. Check a be met in 1 week to monitor his potassium. BNP No results found for: PROBNP Filed Weights   03/22/14 0443 03/23/14 0540 03/24/14 0604  Weight: 69.31 kg (152 lb 12.8 oz) 66.543 kg (146 lb 11.2 oz) 65.137 kg (143 lb 9.6 oz)     Discharge Diagnoses:  Principal Problem:   Acute systolic CHF (congestive heart failure), NYHA class 1 Active Problems:   Hypertensive urgency   Polysubstance abuse   Acute pulmonary edema   Acute respiratory failure with hypoxia   Hypertensive cardiomyopathy   Nonischemic cardiomyopathy   Discharge Condition: Stable  Diet recommendation: low sodium    History of present illness:  49 y.o. male with history of hypertension has not been taking medications for many years now, polysubstance abuse including cocaine presents to the ER because of shortness of breath. Patient states over the last 4 days he has been having increasing shortness of breath on exertion and also on lying down. Denies any fever or chills prior to cough. Patient gets some nonspecific pain around his lower ribs. Chest x-ray shows congestion and BNP levels were elevated. Patient is also found to have elevated blood pressure. Patient was given Lasix 40 mg IV and nitroglycerin patch was placed and admitted for further workup of his CHF with unknown EF. Patient denies any nausea vomiting but does have some abdominal discomfort with bloating sensation. Denies any diarrhea.   Hospital Course:  New onset acute systolic heart failure/acute respiratory failure with hypoxia/acute pulmonary edema: - Start on admission on  IV Lasix with good diuresis, he does admit to using cocaine. - UDS is for cocaine and opiates  Initial set of cardiac enzymes negative 3. - 2-D echo as below low. Started on lisinopril and Coreg which were titrated up. Cardiology was consulted who recommended to start Aldactone. - Cardiology relate that his nonischemic cardiomyopathy is probably due to alcohol and cocaine.  Hypertensive urgency - Has improved since admission we'll continue to monitor and follow-up as an outpatient with his cardiologist in 1 week.  Polysubstance abuse - UDS positive for cocaine opiates counseling was done.  Leukocytosis: - Unclear etiology has remained afebrile, no dysuria, cough or shortness of breath. - Probably due to stressed emargination.  Procedures:  2-D echo that showed diffuse hypokinesia with an EF of 20%. (i.e. Studies not automatically included, echos, thoracentesis, etc; not x-rays)  Consultations:  Cardiology  Discharge Exam: Filed Vitals:   03/24/14 0630  BP: 150/106  Pulse:   Temp:   Resp:     General: A&O x3 Cardiovascular: RRR Respiratory: good air movement CTA B/L  Discharge Instructions      Discharge Instructions    Diet - low sodium heart healthy    Complete by:  As directed      Increase activity slowly    Complete by:  As directed             Medication List    STOP taking these medications        Aspirin-Caffeine 845-65 MG Pack      TAKE these medications        acetaminophen 650 MG CR tablet  Commonly known as:  TYLENOL  Take 650 mg by mouth every 8 (eight)  hours as needed for pain.     aspirin 325 MG EC tablet  Take 1 tablet (325 mg total) by mouth daily.     carvedilol 6.25 MG tablet  Commonly known as:  COREG  Take 1 tablet (6.25 mg total) by mouth 2 (two) times daily with a meal.     DIPHEDRYL PO  Take 2 tablets by mouth daily as needed (itching).     furosemide 40 MG tablet  Commonly known as:  LASIX  Take 1 tablet (40 mg total) by mouth 2 (two) times daily.     hydrocortisone cream 1 %  Apply 1 application topically 2  (two) times daily as needed for itching.     hydrOXYzine 25 MG tablet  Commonly known as:  ATARAX/VISTARIL  Take 1 tablet (25 mg total) by mouth every 6 (six) hours.     lisinopril 20 MG tablet  Commonly known as:  PRINIVIL,ZESTRIL  Take 1 tablet (20 mg total) by mouth daily.     multivitamin with minerals Tabs tablet  Take 1 tablet by mouth daily.     permethrin 5 % cream  Commonly known as:  ELIMITE  - Apply form the neck down before bed. Shower and remove in the morning.  - May repeat once 7 days later.     spironolactone 25 MG tablet  Commonly known as:  ALDACTONE  Take 0.5 tablets (12.5 mg total) by mouth daily.     triamcinolone cream 0.1 %  Commonly known as:  KENALOG  Apply 1 application topically 2 (two) times daily.       Allergies  Allergen Reactions  . Penicillins Other (See Comments)    Childhood       The results of significant diagnostics from this hospitalization (including imaging, microbiology, ancillary and laboratory) are listed below for reference.    Significant Diagnostic Studies: Dg Chest 2 View  03/22/2014   CLINICAL DATA:  Shortness of breath for 3 days  EXAM: CHEST  2 VIEW  COMPARISON:  03/26/2007  FINDINGS: Mild cardiomegaly, new or increased from 2009. Aortic and hilar contours are negative. There is interstitial coarsening with occasional Kerley line and small pleural effusions. No convincing pneumonia. No pneumothorax.  IMPRESSION: Mild CHF.   Electronically Signed   By: Monte Fantasia M.D.   On: 03/22/2014 02:24   US Abdomen Complete  03/22/2014   CLINICAL DATA:  Generalized abdominal pain for 4 days. Atypical chest pain.  EXAM: ULTRASOUND ABDOMEN COMPLETE  COMPARISON:  None.  FINDINGS: Gallbladder: No gallstones or wall thickening visualized. No sonographic Murphy sign noted.  Common bile duct: Diameter: 4 mm, within normal limits.  Liver: No focal lesion identified. Within normal limits in parenchymal echogenicity.  IVC: No abnormality  visualized.  Pancreas: Visualized portion unremarkable.  Spleen: Size and appearance within normal limits.  Right Kidney: Length: 11.4 cm. Echogenicity within normal limits. No mass or hydronephrosis visualized.  Left Kidney: Length: 12.2 cm. Echogenicity within normal limits. No mass or hydronephrosis visualized.  Abdominal aorta: No aneurysm visualized.  Other findings: Bilateral pleural effusions incidentally noted.  IMPRESSION: No evidence of gallstones, biliary dilatation, or other sonographic abnormality within the abdomen.  Bilateral pleural effusions incidentally noted.   Electronically Signed   By: Earle Gell M.D.   On: 03/22/2014 15:25    Microbiology: No results found for this or any previous visit (from the past 240 hour(s)).   Labs: Basic Metabolic Panel:  Recent Labs Lab 03/22/14 0159 03/23/14 0445 03/24/14 0404  NA 141 137 140  K 3.8 3.4* 4.0  CL 109 104 103  CO2  --  26 27  GLUCOSE 105* 100* 101*  BUN '13 10 14  ' CREATININE 1.00 1.13 1.18  CALCIUM  --  9.2 9.6   Liver Function Tests: No results for input(s): AST, ALT, ALKPHOS, BILITOT, PROT, ALBUMIN in the last 168 hours. No results for input(s): LIPASE, AMYLASE in the last 168 hours. No results for input(s): AMMONIA in the last 168 hours. CBC:  Recent Labs Lab 03/22/14 0147 03/22/14 0159 03/23/14 0445  WBC 6.8  --  13.9*  HGB 13.0 14.6 13.7  HCT 38.9* 43.0 41.1  MCV 95.1  --  95.1  PLT 224  --  227   Cardiac Enzymes:  Recent Labs Lab 03/22/14 0900 03/22/14 1505 03/22/14 2109  TROPONINI <0.03 <0.03 0.03   BNP: BNP (last 3 results)  Recent Labs  03/22/14 0232  BNP 1814.0*    ProBNP (last 3 results) No results for input(s): PROBNP in the last 8760 hours.  CBG: No results for input(s): GLUCAP in the last 168 hours.     Signed:  Charlynne Cousins  [facility] 03/24/2014, 10:29 AM

## 2014-03-30 ENCOUNTER — Ambulatory Visit: Payer: Self-pay | Attending: Physician Assistant | Admitting: Physician Assistant

## 2014-03-30 VITALS — BP 138/88 | HR 61 | Temp 98.7°F | Resp 17 | Ht 68.0 in | Wt 150.0 lb

## 2014-03-30 DIAGNOSIS — R358 Other polyuria: Secondary | ICD-10-CM

## 2014-03-30 DIAGNOSIS — R3589 Other polyuria: Secondary | ICD-10-CM

## 2014-03-30 DIAGNOSIS — Z720 Tobacco use: Secondary | ICD-10-CM | POA: Insufficient documentation

## 2014-03-30 DIAGNOSIS — F1910 Other psychoactive substance abuse, uncomplicated: Secondary | ICD-10-CM | POA: Insufficient documentation

## 2014-03-30 DIAGNOSIS — I10 Essential (primary) hypertension: Secondary | ICD-10-CM | POA: Insufficient documentation

## 2014-03-30 DIAGNOSIS — I5021 Acute systolic (congestive) heart failure: Secondary | ICD-10-CM | POA: Insufficient documentation

## 2014-03-30 LAB — BASIC METABOLIC PANEL
BUN: 18 mg/dL (ref 6–23)
CHLORIDE: 105 meq/L (ref 96–112)
CO2: 28 meq/L (ref 19–32)
Calcium: 9.4 mg/dL (ref 8.4–10.5)
Creat: 0.98 mg/dL (ref 0.50–1.35)
Glucose, Bld: 99 mg/dL (ref 70–99)
POTASSIUM: 4.2 meq/L (ref 3.5–5.3)
Sodium: 141 mEq/L (ref 135–145)

## 2014-03-30 NOTE — Progress Notes (Signed)
Bijon Siemon  QQI:[id number]  HER:[id number]  DOB - [birth year]  Chief Complaint  Patient presents with  . Hospitalization Follow-up       Subjective:    [name] is a 49 y.o. male here today for establishment of care. He was hospitalized from 03/22/2014 through 03/24/2014 for shortness of breath, dyspnea, orthopnea, and abdominal pain. He B peptide was elevated. His chest x-ray was consistent with congestive heart failure. He was admitted for diuresis and blood pressure control. His urine drug screen was positive for cocaine. His echo showed an EF of 20% with diffuse global hypokinesia. Cardiology was consult and recommended aspirin, beta blockers, ace inhibitors, Aldactone and Lasix therapy.   Since discharge he has been compliant with his medications. He denies drug use. He denies chest pain. His breathing is much better. His abdominal pain is much better. He states he has been having some cramping.    ROS: GEN: denies fever or chills, denies change in weight Skin: denies lesions or rashes HEENT: denies headache, earache, epistaxis, sore throat, or neck pain LUNGS: denies SHOB, dyspnea, PND, orthopnea CV: denies CP or palpitations ABD: denies abd pain, N or V EXT: denies muscle spasms or swelling; no pain in lower ext, no weakness, +cramping in legs and belly NEURO: denies numbness or tingling, denies sz, stroke or TIA  No problems updated.  ALLERGIES: Allergies  Allergen Reactions  . Penicillins Other (See Comments)    Childhood     PAST MEDICAL HISTORY: Past Medical History  Diagnosis Date  . Hypertension   . Headache     PAST SURGICAL HISTORY: Past Surgical History  Procedure Laterality Date  . No past surgeries      MEDICATIONS AT HOME: Prior to Admission medications   Medication Sig Start Date End Date Taking? Authorizing Provider  aspirin EC 325 MG EC tablet Take 1 tablet (325 mg total) by mouth daily. 03/24/14  Yes Marinda Elk, MD    carvedilol (COREG) 6.25 MG tablet Take 1 tablet (6.25 mg total) by mouth 2 (two) times daily with a meal. 03/24/14  Yes Marinda Elk, MD  furosemide (LASIX) 40 MG tablet Take 1 tablet (40 mg total) by mouth 2 (two) times daily. 03/24/14  Yes Marinda Elk, MD  lisinopril (PRINIVIL,ZESTRIL) 20 MG tablet Take 1 tablet (20 mg total) by mouth daily. 03/24/14  Yes Marinda Elk, MD  acetaminophen (TYLENOL) 650 MG CR tablet Take 650 mg by mouth every 8 (eight) hours as needed for pain.    Historical Provider, MD  DiphenhydrAMINE HCl (DIPHEDRYL PO) Take 2 tablets by mouth daily as needed (itching).    Historical Provider, MD  hydrocortisone cream 1 % Apply 1 application topically 2 (two) times daily as needed for itching.    Historical Provider, MD  hydrOXYzine (ATARAX/VISTARIL) 25 MG tablet Take 1 tablet (25 mg total) by mouth every 6 (six) hours. Patient not taking: Reported on 03/22/2014 12/01/12   Arthor Captain, PA-C  Multiple Vitamin (MULTIVITAMIN WITH MINERALS) TABS tablet Take 1 tablet by mouth daily.    Historical Provider, MD  spironolactone (ALDACTONE) 25 MG tablet Take 0.5 tablets (12.5 mg total) by mouth daily. Patient not taking: Reported on 03/30/2014 03/24/14   Marinda Elk, MD  triamcinolone cream (KENALOG) 0.1 % Apply 1 application topically 2 (two) times daily. Patient not taking: Reported on 03/22/2014 12/01/12   Arthor Captain, PA-C     Objective:   Filed Vitals:   03/30/14 0922  BP: 138/88  Pulse: 61  Temp: 98.7 F (37.1 C)  Resp: 17  Height:  (1.727 m)  Weight: 150 lb (68.04 kg)  SpO2: 100%    Exam General appearance : Awake, alert, not in any distress. Speech Clear. Not toxic looking HEENT: Atraumatic and Normocephalic, pupils equally reactive to light and accomodation Neck: supple, no JVD. No cervical lymphadenopathy.  Chest:Good air entry bilaterally, no added sounds  CVS: S1 S2 regular, no murmurs.  Abdomen: Bowel sounds present, Non tender  and not distended with no gaurding, rigidity or rebound. Extremities: B/L Lower Ext shows no edema, both legs are warm to touch Neurology: Awake alert, and oriented X 3, CN II-XII intact, Non focal Skin:No Rash Wounds:N/A  Data Review No results found for: HGBA1C   Assessment & Plan  1. Systolic CHF exacerbation-compensated  -Continue current medications, refills provided  -Financial counselor, afterwards, referral to cardiology for ongoing care   2. Cramping  -Check BMP   3. Polysubstance abuse/smoker  -Encouraged sobriety  -Smoking cessation discussed  4. HTN-improved  -cont current meds  The patient was given clear instructions to go to ER or return to medical center if symptoms don't improve, worsen or new problems develop. The patient verbalized understanding. The patient was told to call to get lab results if they haven't heard anything in the next week.   This note has been created with Education officer, environmental. Any transcriptional errors are unintentional.    Scot Jun, PA-C [facility] [place], [place] [phone]   03/30/2014, 9:43 AM

## 2014-03-30 NOTE — Patient Instructions (Addendum)
Keep all appointments No smoking, alcohol, or drugs Med refills are available when needed Return to work on Monday

## 2014-03-30 NOTE — Progress Notes (Signed)
  Discharge Diagnoses:  Principal Problem:  Acute systolic CHF (congestive heart failure), NYHA class 1 Active Problems:  Hypertensive urgency  Polysubstance abuse  Acute pulmonary edema  Acute respiratory failure with hypoxia  Hypertensive cardiomyopathy  Nonischemic cardiomyopathy   Discharge Condition: Stable  Diet recommendation: low sodium    History of present illness:  49 y.o. male with history of hypertension has not been taking medications for many years now, polysubstance abuse including cocaine presents to the ER because of shortness of breath. Patient states over the last 4 days he has been having increasing shortness of breath on exertion and also on lying down. Denies any fever or chills prior to cough. Patient gets some nonspecific pain around his lower ribs. Chest x-ray shows congestion and BNP levels were elevated. Patient is also found to have elevated blood pressure. Patient was given Lasix 40 mg IV and nitroglycerin patch was placed and admitted for further workup of his CHF with unknown EF. Patient denies any nausea vomiting but does have some abdominal discomfort with bloating sensation. Denies any diarrhea.   Hospital Course:  New onset acute systolic heart failure/acute respiratory failure with hypoxia/acute pulmonary edema: - Start on admission on IV Lasix with good diuresis, he does admit to using cocaine. - UDS is for cocaine and opiates Initial set of cardiac enzymes negative 3. - 2-D echo as below low. Started on lisinopril and Coreg which were titrated up. Cardiology was consulted who recommended to start Aldactone. - Cardiology relate that his nonischemic cardiomyopathy is probably due to alcohol and cocaine.  Hypertensive urgency - Has improved since admission we'll continue to monitor and follow-up as an outpatient with his cardiologist in 1 week.  Polysubstance abuse - UDS positive for cocaine opiates counseling was  done.  Leukocytosis: - Unclear etiology has remained afebrile, no dysuria, cough or shortness of breath. - Probably due to stressed emargination.  Procedures:  2-D echo that showed diffuse hypokinesia with an EF of 20%. (i.e. Studies not automatically included, echos, thoracentesis, etc; not x-rays)  Consultations:

## 2014-04-19 ENCOUNTER — Ambulatory Visit (HOSPITAL_BASED_OUTPATIENT_CLINIC_OR_DEPARTMENT_OTHER): Payer: Self-pay | Admitting: Internal Medicine

## 2014-04-19 ENCOUNTER — Encounter: Payer: Self-pay | Admitting: Internal Medicine

## 2014-04-19 ENCOUNTER — Ambulatory Visit: Payer: Medicaid Other | Attending: Internal Medicine

## 2014-04-19 VITALS — BP 159/106 | HR 75 | Temp 98.3°F | Resp 16 | Ht 68.0 in | Wt 155.0 lb

## 2014-04-19 DIAGNOSIS — I429 Cardiomyopathy, unspecified: Secondary | ICD-10-CM

## 2014-04-19 DIAGNOSIS — I43 Cardiomyopathy in diseases classified elsewhere: Secondary | ICD-10-CM

## 2014-04-19 DIAGNOSIS — F17210 Nicotine dependence, cigarettes, uncomplicated: Secondary | ICD-10-CM | POA: Insufficient documentation

## 2014-04-19 DIAGNOSIS — F17200 Nicotine dependence, unspecified, uncomplicated: Secondary | ICD-10-CM

## 2014-04-19 DIAGNOSIS — I5021 Acute systolic (congestive) heart failure: Secondary | ICD-10-CM | POA: Insufficient documentation

## 2014-04-19 DIAGNOSIS — F1410 Cocaine abuse, uncomplicated: Secondary | ICD-10-CM | POA: Insufficient documentation

## 2014-04-19 DIAGNOSIS — I10 Essential (primary) hypertension: Secondary | ICD-10-CM

## 2014-04-19 DIAGNOSIS — I110 Hypertensive heart disease with heart failure: Secondary | ICD-10-CM

## 2014-04-19 DIAGNOSIS — I160 Hypertensive urgency: Secondary | ICD-10-CM

## 2014-04-19 DIAGNOSIS — Z720 Tobacco use: Secondary | ICD-10-CM

## 2014-04-19 DIAGNOSIS — F1910 Other psychoactive substance abuse, uncomplicated: Secondary | ICD-10-CM

## 2014-04-19 MED ORDER — FUROSEMIDE 40 MG PO TABS: 40.0000 mg | ORAL_TABLET | Freq: Two times a day (BID) | ORAL | Status: DC

## 2014-04-19 MED ORDER — SPIRONOLACTONE 25 MG PO TABS: 12.5000 mg | ORAL_TABLET | Freq: Every day | ORAL | Status: DC

## 2014-04-19 MED ORDER — CARVEDILOL 6.25 MG PO TABS: 6.2500 mg | ORAL_TABLET | Freq: Two times a day (BID) | ORAL | Status: DC

## 2014-04-19 MED ORDER — LISINOPRIL 20 MG PO TABS: 20.0000 mg | ORAL_TABLET | Freq: Every day | ORAL | Status: DC

## 2014-04-19 NOTE — Progress Notes (Signed)
Patient ID: [name], male   DOB: [birth year], 49 y.o.   MRN: [id number]  CC: f/u  HPI: [name] is a 49 y.o. male here today for a follow up visit.  Patient has past medical history of hypertension. He was hospitalized from 03/22/2014 through 03/24/2014 for shortness of breath, dyspnea, orthopnea, and abdominal pain. His chest x-ray was consistent with congestive heart failure. He was admitted for diuresis and blood pressure control. His urine drug screen was positive for cocaine. His echo showed an EF of 20% with diffuse global hypokinesia. Cardiology was consult and recommended aspirin, beta blockers, ace inhibitors, Aldactone and Lasix therapy.   Since discharge he has been compliant with his medications. He denies drug use. He denies chest pain. His breathing is much better but he does have 3 pillow orthopnea and SOB with prolonged exertion. His abdominal pain is much better. He states he has been having some cramping of his BLE.   Allergies  Allergen Reactions  . Penicillins Other (See Comments)    Childhood    Past Medical History  Diagnosis Date  . Hypertension   . Headache    Current Outpatient Prescriptions on File Prior to Visit  Medication Sig Dispense Refill  . carvedilol (COREG) 6.25 MG tablet Take 1 tablet (6.25 mg total) by mouth 2 (two) times daily with a meal. 60 tablet 0  . furosemide (LASIX) 40 MG tablet Take 1 tablet (40 mg total) by mouth 2 (two) times daily. 30 tablet 0  . lisinopril (PRINIVIL,ZESTRIL) 20 MG tablet Take 1 tablet (20 mg total) by mouth daily. 30 tablet 3  . spironolactone (ALDACTONE) 25 MG tablet Take 0.5 tablets (12.5 mg total) by mouth daily. 30 tablet 0  . acetaminophen (TYLENOL) 650 MG CR tablet Take 650 mg by mouth every 8 (eight) hours as needed for pain.    Marland Kitchen aspirin EC 325 MG EC tablet Take 1 tablet (325 mg total) by mouth daily. (Patient not taking: Reported on 04/19/2014) 30 tablet 0  . DiphenhydrAMINE HCl (DIPHEDRYL PO) Take 2  tablets by mouth daily as needed (itching).    . hydrocortisone cream 1 % Apply 1 application topically 2 (two) times daily as needed for itching.    . hydrOXYzine (ATARAX/VISTARIL) 25 MG tablet Take 1 tablet (25 mg total) by mouth every 6 (six) hours. (Patient not taking: Reported on 03/22/2014) 12 tablet 0  . Multiple Vitamin (MULTIVITAMIN WITH MINERALS) TABS tablet Take 1 tablet by mouth daily.    Marland Kitchen triamcinolone cream (KENALOG) 0.1 % Apply 1 application topically 2 (two) times daily. (Patient not taking: Reported on 03/22/2014) 30 g 0   No current facility-administered medications on file prior to visit.   Family History  Problem Relation Age of Onset  . Hypertension Mother    History   Social History  . Marital Status: Single    Spouse Name: N/A  . Number of Children: N/A  . Years of Education: N/A   Occupational History  . Not on file.   Social History Main Topics  . Smoking status: Current Every Day Smoker -- 0.25 packs/day for 20 years    Types: Cigarettes  . Smokeless tobacco: Not on file  . Alcohol Use: No     Comment: Stopped drinking Alcohol i Feb 16 2014.  . Drug Use: Yes    Special: Cocaine  . Sexual Activity: Not on file   Other Topics Concern  . Not on file   Social History Narrative  Review of Systems  Constitutional: Positive for malaise/fatigue.  Respiratory: Positive for shortness of breath.   Cardiovascular: Positive for orthopnea. Negative for chest pain, palpitations, leg swelling and PND.  Gastrointestinal: Negative for abdominal pain.  Neurological: Positive for weakness. Negative for dizziness and tingling.  All other systems reviewed and are negative.     Objective:   Filed Vitals:   04/19/14 1511  BP: 203/137  Pulse: 85  Temp: 98.3 F (36.8 C)  Resp: 16    Physical Exam: Constitutional: Patient appears well-developed and well-nourished. No distress. Neck: Normal ROM. Neck supple. No JVD. No tracheal deviation. No  thyromegaly. CVS: RRR, S1/S2 +, no murmurs, no gallops, no carotid bruit.  Pulmonary: Effort and breath sounds normal, no stridor, rhonchi, wheezes, rales.  Abdominal: Soft. BS +,  no distension, tenderness, rebound or guarding.  Musculoskeletal: Normal range of motion. No edema and no tenderness.  Neuro: Alert. Normal reflexes, muscle tone coordination. No cranial nerve deficit. Skin: Skin is warm and dry. No rash noted. Not diaphoretic. No erythema. No pallor. Psychiatric: Normal mood and affect. Behavior, judgment, thought content normal.  Lab Results  Component Value Date   WBC 13.9* 03/23/2014   HGB 13.7 03/23/2014   HCT 41.1 03/23/2014   MCV 95.1 03/23/2014   PLT 227 03/23/2014   Lab Results  Component Value Date   CREATININE 0.98 03/30/2014   BUN 18 03/30/2014   NA 141 03/30/2014   K 4.2 03/30/2014   CL 105 03/30/2014   CO2 28 03/30/2014    No results found for: HGBA1C Lipid Panel  No results found for: CHOL, TRIG, HDL, CHOLHDL, VLDL, LDLCALC     Assessment and plan:   Rilen was seen today for establish care.  Diagnoses and all orders for this visit:  Hypertensive urgency Patient was given 0.2 mg of Clonidine in office to help lower pressure. Which brought him down to 159/106. He is asymptomatic.   Acute systolic CHF (congestive heart failure), NYHA class 1 Orders: -     carvedilol (COREG) 6.25 MG tablet; Take 1 tablet (6.25 mg total) by mouth 2 (two) times daily with a meal. -     furosemide (LASIX) 40 MG tablet; Take 1 tablet (40 mg total) by mouth 2 (two) times daily. -     lisinopril (PRINIVIL,ZESTRIL) 20 MG tablet; Take 1 tablet (20 mg total) by mouth daily. -     spironolactone (ALDACTONE) 25 MG tablet; Take 0.5 tablets (12.5 mg total) by mouth daily. Continue CHF medications, advised patient to weight himself daily. Gave strict return precautions. I will have patient to establish care with Dr. Daleen Squibb next week.   Hypertensive cardiomyopathy, with heart  failure Advised to keep HTN under control to prevent further complications. Patient will avoid alcohol and all substances.   Polysubstance abuse Patient reports that he sells cocaine and he only taste his product before distributing. He denies cocaine use. I do believe patient uses cocaine but I have explained to him that whatever he is doing to get cocaine in his system should stop immediately. I have stressed the severity of patients condition and how substance abuse may ultimately lead to death.   Smoking Smoking cessation discussed for 3 minutes, patient is not willing to quit at this time. Will continue to assess on each visit. Discussed increased risk for diseases such as cancer, heart disease, and stroke.      Return for Wednesday with Dr. Daleen Squibb .    Holland Commons, NP-C [facility] [phone] 04/19/2014, 3:46 PM

## 2014-04-19 NOTE — Progress Notes (Signed)
Pt is here to establish care. Pt has a history of HTN and pulmonary edema.  Pt states that he has trouble breathing and he has a headache everyday.

## 2014-04-19 NOTE — Patient Instructions (Addendum)
Keep up the good work with not smoking. Stay away from all drugs  Weight yourself daily. If more than 3 pounds in one day or greater than 5 pounds in one week, please call clinic for additional instructions  Heart Failure Heart failure is a condition in which the heart has trouble pumping blood. This means your heart does not pump blood efficiently for your body to work well. In some cases of heart failure, fluid may back up into your lungs or you may have swelling (edema) in your lower legs. Heart failure is usually a long-term (chronic) condition. It is important for you to take good care of yourself and follow your health care provider's treatment plan. CAUSES  Some health conditions can cause heart failure. Those health conditions include:  High blood pressure (hypertension). Hypertension causes the heart muscle to work harder than normal. When pressure in the blood vessels is high, the heart needs to pump (contract) with more force in order to circulate blood throughout the body. High blood pressure eventually causes the heart to become stiff and weak.  Coronary artery disease (CAD). CAD is the buildup of cholesterol and fat (plaque) in the arteries of the heart. The blockage in the arteries deprives the heart muscle of oxygen and blood. This can cause chest pain and may lead to a heart attack. High blood pressure can also contribute to CAD.  Heart attack (myocardial infarction). A heart attack occurs when one or more arteries in the heart become blocked. The loss of oxygen damages the muscle tissue of the heart. When this happens, part of the heart muscle dies. The injured tissue does not contract as well and weakens the heart's ability to pump blood.  Abnormal heart valves. When the heart valves do not open and close properly, it can cause heart failure. This makes the heart muscle pump harder to keep the blood flowing.  Heart muscle disease (cardiomyopathy or myocarditis). Heart muscle  disease is damage to the heart muscle from a variety of causes. These can include drug or alcohol abuse, infections, or unknown reasons. These can increase the risk of heart failure.  Lung disease. Lung disease makes the heart work harder because the lungs do not work properly. This can cause a strain on the heart, leading it to fail.  Diabetes. Diabetes increases the risk of heart failure. High blood sugar contributes to high fat (lipid) levels in the blood. Diabetes can also cause slow damage to tiny blood vessels that carry important nutrients to the heart muscle. When the heart does not get enough oxygen and food, it can cause the heart to become weak and stiff. This leads to a heart that does not contract efficiently.  Other conditions can contribute to heart failure. These include abnormal heart rhythms, thyroid problems, and low blood counts (anemia). Certain unhealthy behaviors can increase the risk of heart failure, including:  Being overweight.  Smoking or chewing tobacco.  Eating foods high in fat and cholesterol.  Abusing illicit drugs or alcohol.  Lacking physical activity. SYMPTOMS  Heart failure symptoms may vary and can be hard to detect. Symptoms may include:  Shortness of breath with activity, such as climbing stairs.  Persistent cough.  Swelling of the feet, ankles, legs, or abdomen.  Unexplained weight gain.  Difficulty breathing when lying flat (orthopnea).  Waking from sleep because of the need to sit up and get more air.  Rapid heartbeat.  Fatigue and loss of energy.  Feeling light-headed, dizzy, or close to  fainting.  Loss of appetite.  Nausea.  Increased urination during the night (nocturia). DIAGNOSIS  A diagnosis of heart failure is based on your history, symptoms, physical examination, and diagnostic tests. Diagnostic tests for heart failure may include:  Echocardiography.  Electrocardiography.  Chest X-ray.  Blood tests.  Exercise  stress test.  Cardiac angiography.  Radionuclide scans. TREATMENT  Treatment is aimed at managing the symptoms of heart failure. Medicines, behavioral changes, or surgical intervention may be necessary to treat heart failure.  Medicines to help treat heart failure may include:  Angiotensin-converting enzyme (ACE) inhibitors. This type of medicine blocks the effects of a blood protein called angiotensin-converting enzyme. ACE inhibitors relax (dilate) the blood vessels and help lower blood pressure.  Angiotensin receptor blockers (ARBs). This type of medicine blocks the actions of a blood protein called angiotensin. Angiotensin receptor blockers dilate the blood vessels and help lower blood pressure.  Water pills (diuretics). Diuretics cause the kidneys to remove salt and water from the blood. The extra fluid is removed through urination. This loss of extra fluid lowers the volume of blood the heart pumps.  Beta blockers. These prevent the heart from beating too fast and improve heart muscle strength.  Digitalis. This increases the force of the heartbeat.  Healthy behavior changes include:  Obtaining and maintaining a healthy weight.  Stopping smoking or chewing tobacco.  Eating heart-healthy foods.  Limiting or avoiding alcohol.  Stopping illicit drug use.  Physical activity as directed by your health care provider.  Surgical treatment for heart failure may include:  A procedure to open blocked arteries, repair damaged heart valves, or remove damaged heart muscle tissue.  A pacemaker to improve heart muscle function and control certain abnormal heart rhythms.  An internal cardioverter defibrillator to treat certain serious abnormal heart rhythms.  A left ventricular assist device (LVAD) to assist the pumping ability of the heart. HOME CARE INSTRUCTIONS   Take medicines only as directed by your health care provider. Medicines are important in reducing the workload of your  heart, slowing the progression of heart failure, and improving your symptoms.  Do not stop taking your medicine unless directed by your health care provider.  Do not skip any dose of medicine.  Refill your prescriptions before you run out of medicine. Your medicines are needed every day.  Engage in moderate physical activity if directed by your health care provider. Moderate physical activity can benefit some people. The elderly and people with severe heart failure should consult with a health care provider for physical activity recommendations.  Eat heart-healthy foods. Food choices should be free of trans fat and low in saturated fat, cholesterol, and salt (sodium). Healthy choices include fresh or frozen fruits and vegetables, fish, lean meats, legumes, fat-free or low-fat dairy products, and whole grain or high fiber foods. Talk to a dietitian to learn more about heart-healthy foods.  Limit sodium if directed by your health care provider. Sodium restriction may reduce symptoms of heart failure in some people. Talk to a dietitian to learn more about heart-healthy seasonings.  Use healthy cooking methods. Healthy cooking methods include roasting, grilling, broiling, baking, poaching, steaming, or stir-frying. Talk to a dietitian to learn more about healthy cooking methods.  Limit fluids if directed by your health care provider. Fluid restriction may reduce symptoms of heart failure in some people.  Weigh yourself every day. Daily weights are important in the early recognition of excess fluid. You should weigh yourself every morning after you urinate and before  you eat breakfast. Wear the same amount of clothing each time you weigh yourself. Record your daily weight. Provide your health care provider with your weight record.  Monitor and record your blood pressure if directed by your health care provider.  Check your pulse if directed by your health care provider.  Lose weight if directed by  your health care provider. Weight loss may reduce symptoms of heart failure in some people.  Stop smoking or chewing tobacco. Nicotine makes your heart work harder by causing your blood vessels to constrict. Do not use nicotine gum or patches before talking to your health care provider.  Keep all follow-up visits as directed by your health care provider. This is important.  Limit alcohol intake to no more than 1 drink per day for nonpregnant women and 2 drinks per day for men. One drink equals 12 ounces of beer, 5 ounces of wine, or 1 ounces of hard liquor. Drinking more than that is harmful to your heart. Tell your health care provider if you drink alcohol several times a week. Talk with your health care provider about whether alcohol is safe for you. If your heart has already been damaged by alcohol or you have severe heart failure, drinking alcohol should be stopped completely.  Stop illicit drug use.  Stay up-to-date with immunizations. It is especially important to prevent respiratory infections through current pneumococcal and influenza immunizations.  Manage other health conditions such as hypertension, diabetes, thyroid disease, or abnormal heart rhythms as directed by your health care provider.  Learn to manage stress.  Plan rest periods when fatigued.  Learn strategies to manage high temperatures. If the weather is extremely hot:  Avoid vigorous physical activity.  Use air conditioning or fans or seek a cooler location.  Avoid caffeine and alcohol.  Wear loose-fitting, lightweight, and light-colored clothing.  Learn strategies to manage cold temperatures. If the weather is extremely cold:  Avoid vigorous physical activity.  Layer clothes.  Wear mittens or gloves, a hat, and a scarf when going outside.  Avoid alcohol.  Obtain ongoing education and support as needed.  Participate in or seek rehabilitation as needed to maintain or improve independence and quality of  life. SEEK MEDICAL CARE IF:   Your weight increases by 03 lb/1.4 kg in 1 day or 05 lb/2.3 kg in a week.  You have increasing shortness of breath that is unusual for you.  You are unable to participate in your usual physical activities.  You tire easily.  You cough more than normal, especially with physical activity.  You have any or more swelling in areas such as your hands, feet, ankles, or abdomen.  You are unable to sleep because it is hard to breathe.  You feel like your heart is beating fast (palpitations).  You become dizzy or light-headed upon standing up. SEEK IMMEDIATE MEDICAL CARE IF:   You have difficulty breathing.  There is a change in mental status such as decreased alertness or difficulty with concentration.  You have a pain or discomfort in your chest.  You have an episode of fainting (syncope). MAKE SURE YOU:   Understand these instructions.  Will watch your condition.  Will get help right away if you are not doing well or get worse. Document Released: 01/06/2005 Document Revised: 05/23/2013 Document Reviewed: 02/06/2012 [facility] Patient Information 2015 [place], [place]. This information is not intended to replace advice given to you by your health care provider. Make sure you discuss any questions you have with your  health care provider.  

## 2014-04-26 ENCOUNTER — Encounter: Payer: Self-pay | Admitting: Cardiology

## 2014-04-26 ENCOUNTER — Ambulatory Visit: Payer: Self-pay | Attending: Cardiology | Admitting: Cardiology

## 2014-04-26 VITALS — Temp 98.0°F | Ht 68.0 in | Wt 155.0 lb

## 2014-04-26 DIAGNOSIS — I43 Cardiomyopathy in diseases classified elsewhere: Secondary | ICD-10-CM | POA: Insufficient documentation

## 2014-04-26 DIAGNOSIS — F1490 Cocaine use, unspecified, uncomplicated: Secondary | ICD-10-CM | POA: Insufficient documentation

## 2014-04-26 DIAGNOSIS — F1010 Alcohol abuse, uncomplicated: Secondary | ICD-10-CM | POA: Insufficient documentation

## 2014-04-26 DIAGNOSIS — I426 Alcoholic cardiomyopathy: Secondary | ICD-10-CM

## 2014-04-26 DIAGNOSIS — I5022 Chronic systolic (congestive) heart failure: Secondary | ICD-10-CM | POA: Insufficient documentation

## 2014-04-26 DIAGNOSIS — Z7982 Long term (current) use of aspirin: Secondary | ICD-10-CM | POA: Insufficient documentation

## 2014-04-26 DIAGNOSIS — I110 Hypertensive heart disease with heart failure: Secondary | ICD-10-CM | POA: Insufficient documentation

## 2014-04-26 DIAGNOSIS — I5021 Acute systolic (congestive) heart failure: Secondary | ICD-10-CM

## 2014-04-26 DIAGNOSIS — I429 Cardiomyopathy, unspecified: Secondary | ICD-10-CM

## 2014-04-26 DIAGNOSIS — Z87891 Personal history of nicotine dependence: Secondary | ICD-10-CM | POA: Insufficient documentation

## 2014-04-26 LAB — BASIC METABOLIC PANEL
BUN: 14 mg/dL (ref 6–23)
CALCIUM: 9.6 mg/dL (ref 8.4–10.5)
CHLORIDE: 100 meq/L (ref 96–112)
CO2: 32 mEq/L (ref 19–32)
Creat: 0.91 mg/dL (ref 0.50–1.35)
GLUCOSE: 67 mg/dL — AB (ref 70–99)
POTASSIUM: 3.9 meq/L (ref 3.5–5.3)
Sodium: 143 mEq/L (ref 135–145)

## 2014-04-26 MED ORDER — FUROSEMIDE 40 MG PO TABS: 80.0000 mg | ORAL_TABLET | Freq: Every day | ORAL | Status: DC

## 2014-04-26 NOTE — Progress Notes (Signed)
HPI [name] -year-old black male who comes today with his mother after being hospitalized for acute systolic heart failure.  His complaints today are a hoarse voice. He is not wheezing and there is no tongue swelling.He denies any cough. He also still gets mildly short of breath lying down with 2 pillows. He has been weighing himself and his weights around 155. He was 142 on his scales when he came home from the hospital.  Etiology of his systolic heart failure is lifelong alcohol abuse since age 26, cocaine, and hypertension. He also was a heavy smoker.  He says he is not using any alcohol and has not used cocaine for several months. However, his urine drug screen was positive on admission for cocaine. He felt this was secondary to exposure. He is also quit smoking. He seems to be compliant with his medications. His diet is poor eating mostly carbohydrates especially sweets. His mother says he's never been a very good eater.  Past Medical History  Diagnosis Date  . Hypertension   . Headache     Current Outpatient Prescriptions  Medication Sig Dispense Refill  . aspirin EC 325 MG EC tablet Take 1 tablet (325 mg total) by mouth daily. 30 tablet 0  . carvedilol (COREG) 6.25 MG tablet Take 1 tablet (6.25 mg total) by mouth 2 (two) times daily with a meal. 60 tablet 4  . furosemide (LASIX) 40 MG tablet Take 1 tablet (40 mg total) by mouth 2 (two) times daily. 60 tablet 4  . lisinopril (PRINIVIL,ZESTRIL) 20 MG tablet Take 1 tablet (20 mg total) by mouth daily. 30 tablet 4  . spironolactone (ALDACTONE) 25 MG tablet Take 0.5 tablets (12.5 mg total) by mouth daily. 60 tablet 4   No current facility-administered medications for this visit.    Allergies  Allergen Reactions  . Penicillins Other (See Comments)    Childhood     Family History  Problem Relation Age of Onset  . Hypertension Mother     History   Social History  . Marital Status: Single    Spouse Name: N/A  . Number  of Children: N/A  . Years of Education: N/A   Occupational History  . Not on file.   Social History Main Topics  . Smoking status: Former Smoker -- 0.25 packs/day for 20 years    Types: Cigarettes    Quit date: 03/22/2014  . Smokeless tobacco: Not on file  . Alcohol Use: No     Comment: Stopped drinking Alcohol i Feb 16 2014.  . Drug Use: Yes    Special: Cocaine  . Sexual Activity: Not on file   Other Topics Concern  . Not on file   Social History Narrative    ROS ALL NEGATIVE EXCEPT THOSE NOTED IN HPI  PE  General Appearance: well developed, well nourished in no acute distress HEENT: symmetrical face, PERRLA, good dentition , raspy voice Neck: JVD to the angle of jaw the small V wave, thyromegaly, or adenopathy, trachea midline Chest: symmetric without deformity Cardiac: PMI displaced inferolaterally RRR, normal S1, S2, no gallop or murmur Lung: few bibasilar crackles Vascular: all pulses full without bruits  Abdominal: nondistended, nontender, good bowel sounds, no HSM, no bruits, liver edge felt with inspiration Extremities: no cyanosis, clubbing or edema, no sign of DVT, no varicosities  Skin: normal color, no rashes Neuro: alert and oriented x 3, non-focal Pysch: normal affect  EKG  BMET    Component Value Date/Time   NA 141  03/30/2014 0944   K 4.2 03/30/2014 0944   CL 105 03/30/2014 0944   CO2 28 03/30/2014 0944   GLUCOSE 99 03/30/2014 0944   BUN 18 03/30/2014 0944   CREATININE 0.98 03/30/2014 0944   CREATININE 1.18 03/24/2014 0404   CALCIUM 9.4 03/30/2014 0944   GFRNONAA 71* 03/24/2014 0404   GFRAA 83* 03/24/2014 0404    Lipid Panel  No results found for: CHOL, TRIG, HDL, CHOLHDL, VLDL, LDLCALC  CBC    Component Value Date/Time   WBC 13.9* 03/23/2014 0445   RBC 4.32 03/23/2014 0445   HGB 13.7 03/23/2014 0445   HCT 41.1 03/23/2014 0445   PLT 227 03/23/2014 0445   MCV 95.1 03/23/2014 0445   MCH 31.7 03/23/2014 0445   MCHC 33.3 03/23/2014  0445   RDW 12.9 03/23/2014 0445   LYMPHSABS 0.5* 03/26/2007 1947   MONOABS 0.4 03/26/2007 1947   EOSABS 0.0 03/26/2007 1947   BASOSABS 0.0 03/26/2007 1947

## 2014-04-26 NOTE — Patient Instructions (Signed)
It was great meeting you today. Furosemide (Lasix) has been increased to 80 mg daily in the morning. Dr. Daleen Squibb would like your weight to get down to 150 lbs. Once at goal, if your weight increases MORE than 2 lbs (153 lbs+) take an additional Lasix (total of 120 mg) that daily. If your weight decreases less than 2 lbs (less than 148 lbs) only take 40 mg of Lasix that day. Dr. Daleen Squibb would like to see you back in 4 weeks.

## 2014-04-26 NOTE — Progress Notes (Signed)
Patient here for evaluation of hypertensive cardiomyopathy, congestive heart failure.   Patient complaining of constant shortness of breath, 3 pillow orthopnea.  Patient indicates he has had chest pain twice in the last week. Pain under ribcage and it caused increased shortness of breath. Patient indicates he has been weighing himself daily for past week and has been compliant with medications. Patient indicates he stopped smoking in 3/16 and drinking 12/15.  Denies recreational drug use.

## 2014-04-26 NOTE — Assessment & Plan Note (Signed)
Continue current medications with no alcohol or tobacco use nor cocaine.

## 2014-04-26 NOTE — Assessment & Plan Note (Signed)
This is secondary to long-standing alcohol abuse and untreated hypertension. He is on good medical program though his weight is up if we consider his discharge weight at 142 and now is 155. He clinically is volume overloaded and symptomatic.  Change Lasix to 80 mg every morning for more potent effect. Weigh daily. Dry weight will be 150 with Lasix adjustment as outlined to him and his mother if outside 4 pound range of 148-152. I will see him back for close follow-up in 4 weeks. I've encouraged for protein intake. Patient basically told never to smoke, drink, or use any drugs in the future.  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 I've recommended that he not do any lifting and his balls will not let him.  We'll hope for some recovery in LV systolic function of the next 6 months. Repeat echo at that time.  All questions answered. Will check metabolic profile today.

## 2014-05-05 ENCOUNTER — Telehealth: Payer: Self-pay

## 2014-05-05 NOTE — Telephone Encounter (Signed)
-----   Message from Gaylord Shih, MD sent at 05/01/2014  9:53 AM EDT ----- Looks good. No change in treatment.

## 2014-05-05 NOTE — Telephone Encounter (Signed)
Call placed to both patient and patient's emergency contact to inform of lab results.  Both contact numbers disconnected.  Unable to reach patient x2.

## 2014-05-24 ENCOUNTER — Ambulatory Visit: Payer: Self-pay | Attending: Cardiology | Admitting: Cardiology

## 2014-05-24 ENCOUNTER — Encounter: Payer: Self-pay | Admitting: Cardiology

## 2014-05-24 VITALS — BP 120/76 | HR 64 | Temp 98.1°F | Resp 18 | Ht 68.0 in | Wt 154.0 lb

## 2014-05-24 DIAGNOSIS — I110 Hypertensive heart disease with heart failure: Secondary | ICD-10-CM

## 2014-05-24 DIAGNOSIS — I5022 Chronic systolic (congestive) heart failure: Secondary | ICD-10-CM

## 2014-05-24 DIAGNOSIS — I5021 Acute systolic (congestive) heart failure: Secondary | ICD-10-CM

## 2014-05-24 DIAGNOSIS — I429 Cardiomyopathy, unspecified: Secondary | ICD-10-CM

## 2014-05-24 DIAGNOSIS — I43 Cardiomyopathy in diseases classified elsewhere: Secondary | ICD-10-CM

## 2014-05-24 LAB — BASIC METABOLIC PANEL
BUN: 10 mg/dL (ref 6–23)
CALCIUM: 9.2 mg/dL (ref 8.4–10.5)
CO2: 23 mEq/L (ref 19–32)
CREATININE: 0.7 mg/dL (ref 0.50–1.35)
Chloride: 105 mEq/L (ref 96–112)
GLUCOSE: 88 mg/dL (ref 70–99)
Potassium: 3.9 mEq/L (ref 3.5–5.3)
SODIUM: 138 meq/L (ref 135–145)

## 2014-05-24 MED ORDER — CARVEDILOL 6.25 MG PO TABS: 6.2500 mg | ORAL_TABLET | Freq: Two times a day (BID) | ORAL | Status: DC

## 2014-05-24 MED ORDER — FUROSEMIDE 40 MG PO TABS: 80.0000 mg | ORAL_TABLET | Freq: Every day | ORAL | Status: DC

## 2014-05-24 MED ORDER — SPIRONOLACTONE 25 MG PO TABS: 12.5000 mg | ORAL_TABLET | Freq: Every day | ORAL | Status: DC

## 2014-05-24 MED ORDER — LISINOPRIL 20 MG PO TABS: 20.0000 mg | ORAL_TABLET | Freq: Every day | ORAL | Status: DC

## 2014-05-24 NOTE — Patient Instructions (Signed)
Thank you for coming to see Dr. Daleen Squibb.  We have refilled your medications for 1 year. Dr. Daleen Squibb would like you to return in 3 months for a follow up visit.

## 2014-05-24 NOTE — Progress Notes (Signed)
Patient here for follow up. Has been compliant with lasix regimen. Stopped smoking, drinking alcohol and drug use. Patient weighs 154lb today. Patient reports right hand feels like its tingling and tight every morning. Patient still hoarse, has been since in hospital. Patient reports headache every morning at level 7. Patients headache currently at level 2. Patient has not been taking aspirin. Patient needs refills on all meds.

## 2014-05-24 NOTE — Assessment & Plan Note (Signed)
Blood pressure under good control. No change in medical program.

## 2014-05-24 NOTE — Progress Notes (Signed)
[name] returns for close follow-up of his new onset systolic congestive heart failure. Please see my note a couple weeks ago.  He is being extremely compliant with his medications and weighing himself daily. His weight is averaging about 152 pounds. His maximum was 157 and he took extra Lasix as instructed. He still sleeps on 3 pillows. His breathing has improved however with less dyspnea on exertion. He has had no edema.  His exam today shows him to be in no acute distress. Neck shows minimal JVD. Lungs are clear to auscultation. Are reveals a regular rate and rhythm without gallop. PMI is displaced inferolaterally., Exam is soft and nondistended. Extremities reveal no edema.

## 2014-05-24 NOTE — Assessment & Plan Note (Signed)
Medications renewed. We'll check metabolic profile. Continue with same program self adjusting Lasix with weight range 148-152. Return to see me in 3 months. We'll repeat echocardiogram in 5 months and hope for LV systolic recovery. All questions answered.

## 2014-09-20 ENCOUNTER — Ambulatory Visit: Payer: Medicaid Other | Admitting: Cardiology

## 2014-09-27 ENCOUNTER — Other Ambulatory Visit: Payer: Self-pay | Admitting: *Deleted

## 2014-09-27 ENCOUNTER — Ambulatory Visit: Payer: Self-pay | Attending: Cardiology | Admitting: Cardiology

## 2014-09-27 ENCOUNTER — Encounter: Payer: Self-pay | Admitting: Cardiology

## 2014-09-27 VITALS — BP 156/91 | HR 72 | Temp 98.1°F | Resp 18 | Ht 68.0 in | Wt 148.0 lb

## 2014-09-27 DIAGNOSIS — I5021 Acute systolic (congestive) heart failure: Secondary | ICD-10-CM

## 2014-09-27 DIAGNOSIS — I429 Cardiomyopathy, unspecified: Secondary | ICD-10-CM | POA: Insufficient documentation

## 2014-09-27 DIAGNOSIS — F1910 Other psychoactive substance abuse, uncomplicated: Secondary | ICD-10-CM | POA: Insufficient documentation

## 2014-09-27 DIAGNOSIS — I110 Hypertensive heart disease with heart failure: Secondary | ICD-10-CM | POA: Insufficient documentation

## 2014-09-27 DIAGNOSIS — I5022 Chronic systolic (congestive) heart failure: Secondary | ICD-10-CM | POA: Insufficient documentation

## 2014-09-27 DIAGNOSIS — I43 Cardiomyopathy in diseases classified elsewhere: Secondary | ICD-10-CM

## 2014-09-27 MED ORDER — SPIRONOLACTONE 25 MG PO TABS: 12.5000 mg | ORAL_TABLET | Freq: Every day | ORAL | Status: DC

## 2014-09-27 MED ORDER — LISINOPRIL 20 MG PO TABS: 20.0000 mg | ORAL_TABLET | Freq: Every day | ORAL | Status: DC

## 2014-09-27 MED ORDER — FUROSEMIDE 40 MG PO TABS: 80.0000 mg | ORAL_TABLET | Freq: Every day | ORAL | Status: DC

## 2014-09-27 MED ORDER — CARVEDILOL 6.25 MG PO TABS: 6.2500 mg | ORAL_TABLET | Freq: Two times a day (BID) | ORAL | Status: DC

## 2014-09-27 MED ORDER — ASPIRIN 325 MG PO TBEC: 325.0000 mg | DELAYED_RELEASE_TABLET | Freq: Every day | ORAL | Status: DC

## 2014-09-27 NOTE — Progress Notes (Signed)
Mr. Vance returns today after missing his appointment several months ago. 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  He is convinced that his medications are causing headaches. He is a good season the morning. He's really not taking them and has not taken them today.  He is now started to smoke again. He says he's done cocaine 1 time on his birthday and had some alcohol on his brother's birthday.  He denies any orthopnea but noticed severe swelling after eating pork recently. His weight is asked to 7 pounds down but I think this is from not eating properly.  His exam today shows him to be hoarse which is chronic. He is in no acute distress. Neck shows no JVD. Lungs are clear to auscultation. Heart reveals normal S1-S2 no gallop. Abdominal exam is soft extremities reveal no edema pulses are present.

## 2014-09-27 NOTE — Assessment & Plan Note (Signed)
Medications renewed for blood pressure control and secondary preventative therapy. Discontinued carvedilol because of cocaine use and may be causing headaches but unlikely.

## 2014-09-27 NOTE — Assessment & Plan Note (Signed)
This is multifactorial including poorly controlled blood pressure, noncompliance, cocaine use, alcohol use. We will discontinue his carvedilol which may be causing some headaches. He also is doing cocaine so this is not a safe drug. Have renewed his other medications. We'll schedule him for follow-up in 6 weeks to check his blood pressure and see if he is complying. I've encouraged him not to smoke or to drink or do cocaine.

## 2014-09-27 NOTE — Assessment & Plan Note (Signed)
I reviewed with him the importance of not doing this.

## 2014-09-27 NOTE — Progress Notes (Signed)
Patient denies pain at the moment. Patient notice wheezing while communicating and exertion. Patient states no swelling. Patient denies taking medication this morning. Patient states that "meds give me headaches"  Patient states he "slacked up on medications"

## 2014-09-27 NOTE — Patient Instructions (Signed)
Thank you for visiting with Dr. Daleen Squibb today. Stop taking Carvedilol. Take all other medications as prescribed. Return to see Dr. Daleen Squibb in 6 weeks.

## 2014-11-01 ENCOUNTER — Ambulatory Visit: Payer: Medicaid Other | Admitting: Cardiology

## 2014-11-15 ENCOUNTER — Ambulatory Visit: Payer: Medicaid Other | Admitting: Cardiology

## 2014-11-17 ENCOUNTER — Encounter: Payer: Self-pay | Admitting: Pharmacist

## 2014-11-22 ENCOUNTER — Ambulatory Visit: Payer: Medicaid Other | Admitting: Cardiology

## 2014-11-29 ENCOUNTER — Ambulatory Visit: Payer: Medicaid Other | Admitting: Cardiology

## 2014-12-06 ENCOUNTER — Ambulatory Visit: Payer: Self-pay | Attending: Cardiology | Admitting: Cardiology

## 2014-12-06 ENCOUNTER — Encounter: Payer: Self-pay | Admitting: Cardiology

## 2014-12-06 VITALS — BP 134/82 | HR 82 | Temp 99.2°F | Resp 20 | Ht 68.0 in | Wt 151.8 lb

## 2014-12-06 DIAGNOSIS — I10 Essential (primary) hypertension: Secondary | ICD-10-CM

## 2014-12-06 DIAGNOSIS — I5022 Chronic systolic (congestive) heart failure: Secondary | ICD-10-CM

## 2014-12-06 HISTORY — DX: Essential (primary) hypertension: I10

## 2014-12-06 MED ORDER — CLONIDINE HCL 0.2 MG PO TABS
0.2000 mg | ORAL_TABLET | Freq: Once | ORAL | Status: AC
  Administered 2014-12-06: 0.2 mg via ORAL

## 2014-12-06 MED ORDER — CLONIDINE HCL 0.1 MG PO TABS: 0.1000 mg | ORAL_TABLET | Freq: Once | ORAL | Status: DC

## 2014-12-06 NOTE — Patient Instructions (Signed)
Thank you for visiting with Dr. Daleen Squibb today. Please keep appointment with Misty Stanley on next Wed 12/13/14 Please bring all medications to appointment on 12/13/14

## 2014-12-06 NOTE — Assessment & Plan Note (Signed)
His blood pressure is extremely high in the clinic today. We'll start clonidine protocol and keep him here for next hour and a half. If his blood pressure gets down to less than or equal to 180 will let him go home. Will renew his medications for free through the clinic and have him come back next week for consultation with [facility] our pharmacist for med reconciliation and accountability. I will see him back in 4 weeks. We will try to keep him out of the hospital

## 2014-12-06 NOTE — Progress Notes (Signed)
[name] comes in today for close follow-up of his hypertensive cardiomyopathy. He also has a history of heavy alcohol use and cocaine use.  On last visit he was not taking any of his medications. We renewed them but he has not gotten them filled. He says he cannot afford them. They cost him $4 a prescription per [facility].  He denies any chest pain, orthopnea, PND or edema. He is smoking 1 cigarette a day. He states he has not done cocaine. He denies any regular use of alcohol.  He's in no acute distress. He has a hoarse voice which is baseline. Neck shows no JVD. Carotids are full. Lungs are clear to auscultation. Heart reveals a regular rate and rhythm with a rate of about 90 bpm. There is no gallop. Abdominal exam is soft with good bowel sounds, extremities reveal no edema.

## 2014-12-06 NOTE — Progress Notes (Signed)
Patient here for 6 week F/U  Patient denies pain at this time. Patient complains of SOB and Wheezing being constant Patient complains of being hoarse since he received a breathing treatment at the hospital 11/112/14  Patients BP is 209/118. 2hd check: 212/121. Manual Recheck: 194/102  Patient has been out of his medications for 3 weeks due to patient not being able to afford his medications. Patient states he only eats once or twice a week to maintain his weight.

## 2014-12-13 ENCOUNTER — Encounter: Payer: Medicaid Other | Admitting: Pharmacist

## 2015-01-22 ENCOUNTER — Emergency Department (HOSPITAL_COMMUNITY)
Admission: EM | Admit: 2015-01-22 | Discharge: 2015-01-23 | Disposition: A | Discharge status: Home / Self Care | Payer: Self-pay | Attending: Emergency Medicine | Admitting: Emergency Medicine

## 2015-01-22 ENCOUNTER — Emergency Department (HOSPITAL_COMMUNITY): Payer: Self-pay

## 2015-01-22 ENCOUNTER — Encounter (HOSPITAL_COMMUNITY): Payer: Self-pay | Admitting: *Deleted

## 2015-01-22 DIAGNOSIS — X509XXA Other and unspecified overexertion or strenuous movements or postures, initial encounter: Secondary | ICD-10-CM | POA: Insufficient documentation

## 2015-01-22 DIAGNOSIS — Y998 Other external cause status: Secondary | ICD-10-CM | POA: Insufficient documentation

## 2015-01-22 DIAGNOSIS — F17210 Nicotine dependence, cigarettes, uncomplicated: Secondary | ICD-10-CM | POA: Insufficient documentation

## 2015-01-22 DIAGNOSIS — I10 Essential (primary) hypertension: Secondary | ICD-10-CM | POA: Insufficient documentation

## 2015-01-22 DIAGNOSIS — Z7982 Long term (current) use of aspirin: Secondary | ICD-10-CM | POA: Insufficient documentation

## 2015-01-22 DIAGNOSIS — Y9389 Activity, other specified: Secondary | ICD-10-CM | POA: Insufficient documentation

## 2015-01-22 DIAGNOSIS — Y9289 Other specified places as the place of occurrence of the external cause: Secondary | ICD-10-CM | POA: Insufficient documentation

## 2015-01-22 DIAGNOSIS — Z79899 Other long term (current) drug therapy: Secondary | ICD-10-CM | POA: Insufficient documentation

## 2015-01-22 DIAGNOSIS — Z880 Allergy status to penicillin: Secondary | ICD-10-CM | POA: Insufficient documentation

## 2015-01-22 DIAGNOSIS — S39012A Strain of muscle, fascia and tendon of lower back, initial encounter: Secondary | ICD-10-CM | POA: Insufficient documentation

## 2015-01-22 LAB — BASIC METABOLIC PANEL
ANION GAP: 12 (ref 5–15)
BUN: 12 mg/dL (ref 6–20)
CHLORIDE: 110 mmol/L (ref 101–111)
CO2: 22 mmol/L (ref 22–32)
Calcium: 9.2 mg/dL (ref 8.9–10.3)
Creatinine, Ser: 0.81 mg/dL (ref 0.61–1.24)
GFR calc non Af Amer: >60 mL/min (ref >60)
Glucose, Bld: 92 mg/dL (ref 65–99)
POTASSIUM: 4 mmol/L (ref 3.5–5.1)
SODIUM: 144 mmol/L (ref 135–145)

## 2015-01-22 LAB — I-STAT TROPONIN, ED: Troponin i, poc: 0.02 ng/mL (ref 0.00–0.08)

## 2015-01-22 LAB — CBC
HEMATOCRIT: 41.1 % (ref 39.0–52.0)
Hemoglobin: 14.1 g/dL (ref 13.0–17.0)
MCH: 33.7 pg (ref 26.0–34.0)
MCHC: 34.3 g/dL (ref 30.0–36.0)
MCV: 98.3 fL (ref 78.0–100.0)
PLATELETS: 202 10*3/uL (ref 150–400)
RBC: 4.18 MIL/uL — AB (ref 4.22–5.81)
RDW: 12.8 % (ref 11.5–15.5)
WBC: 5.4 10*3/uL (ref 4.0–10.5)

## 2015-01-22 NOTE — ED Notes (Signed)
Pt states he pulled a radiator out of a car and heard something pop in his back. States that his lower back has been hurting x 2 days since. States that he doesn't want to take a deep breath due to the pain which is causing his chest to hurt.

## 2015-01-23 ENCOUNTER — Emergency Department (HOSPITAL_COMMUNITY): Payer: Medicaid Other

## 2015-01-23 LAB — I-STAT TROPONIN, ED: TROPONIN I, POC: 0.02 ng/mL (ref 0.00–0.08)

## 2015-01-23 MED ORDER — FENTANYL CITRATE (PF) 100 MCG/2ML IJ SOLN
50.0000 ug | Freq: Once | INTRAMUSCULAR | Status: AC
  Administered 2015-01-23: 50 ug via NASAL
  Filled 2015-01-23: qty 2

## 2015-01-23 MED ORDER — KETOROLAC TROMETHAMINE 30 MG/ML IJ SOLN
30.0000 mg | Freq: Once | INTRAMUSCULAR | Status: AC
  Administered 2015-01-23: 30 mg via INTRAVENOUS
  Filled 2015-01-23: qty 1

## 2015-01-23 MED ORDER — METHOCARBAMOL 500 MG PO TABS
1000.0000 mg | ORAL_TABLET | Freq: Once | ORAL | Status: AC
  Administered 2015-01-23: 1000 mg via ORAL
  Filled 2015-01-23: qty 2

## 2015-01-23 MED ORDER — MORPHINE SULFATE (PF) 4 MG/ML IV SOLN
4.0000 mg | Freq: Once | INTRAVENOUS | Status: AC
  Administered 2015-01-23: 4 mg via INTRAVENOUS
  Filled 2015-01-23: qty 1

## 2015-01-23 MED ORDER — IBUPROFEN 600 MG PO TABS: 600.0000 mg | ORAL_TABLET | Freq: Four times a day (QID) | ORAL | Status: DC | PRN

## 2015-01-23 MED ORDER — SODIUM CHLORIDE 0.9 % IV BOLUS (SEPSIS)
1000.0000 mL | Freq: Once | INTRAVENOUS | Status: AC
  Administered 2015-01-23: 1000 mL via INTRAVENOUS

## 2015-01-23 MED ORDER — METHOCARBAMOL 500 MG PO TABS: 1000.0000 mg | ORAL_TABLET | Freq: Three times a day (TID) | ORAL | Status: DC | PRN

## 2015-01-23 NOTE — ED Provider Notes (Signed)
CSN: [id number]     Arrival date & time 01/22/15  2237 History  By signing my name below, I, Freida Busman, attest that this documentation has been prepared under the direction and in the presence of Loren Racer, MD . Electronically Signed: Freida Busman, Scribe. 01/23/2015. 1:26 AM.    Chief Complaint  Patient presents with  . Back Pain  . Chest Pain    The history is provided by the patient. No language interpreter was used.   HPI Comments:  [name] is a 50 y.o. male who presents to the Emergency Department complaining of 10/10 lower back pain x 2 days. He states he was pulling a radiator from a junkyard when he felt a pop. He has taken ibuprofen with little relief.  He denies h/o back surgery. He also denies numbness and pain in his BLE, and bowel/bladder incontinence. Pt has no other complaints or symptoms at this time.   Past Medical History  Diagnosis Date  . Hypertension   . Headache    Past Surgical History  Procedure Laterality Date  . No past surgeries     Family History  Problem Relation Age of Onset  . Hypertension Mother    Social History  Substance Use Topics  . Smoking status: Current Some Day Smoker -- 0.25 packs/day for 20 years    Types: Cigarettes  . Smokeless tobacco: None  . Alcohol Use: 0.0 oz/week    0 Standard drinks or equivalent per week     Comment: soccially    Review of Systems  Constitutional: Negative for fever and chills.  Respiratory: Negative for shortness of breath.   Cardiovascular: Negative for chest pain, palpitations and leg swelling.  Gastrointestinal: Negative for nausea, vomiting and abdominal pain.  Genitourinary: Negative for dysuria, frequency, flank pain and difficulty urinating.  Musculoskeletal: Positive for myalgias, back pain and gait problem. Negative for neck pain and neck stiffness.  Skin: Negative for rash and wound.  Neurological: Negative for weakness, light-headedness, numbness and headaches.  All other  systems reviewed and are negative.   Allergies  Penicillins  Home Medications   Prior to Admission medications   Medication Sig Start Date End Date Taking? Authorizing Provider  aspirin 325 MG EC tablet Take 1 tablet (325 mg total) by mouth daily. 09/27/14  Yes Gaylord Shih, MD  furosemide (LASIX) 40 MG tablet Take 2 tablets (80 mg total) by mouth daily. 09/27/14  Yes Gaylord Shih, MD  lisinopril (PRINIVIL,ZESTRIL) 20 MG tablet Take 1 tablet (20 mg total) by mouth daily. 09/27/14  Yes Gaylord Shih, MD  spironolactone (ALDACTONE) 25 MG tablet Take 0.5 tablets (12.5 mg total) by mouth daily. 09/27/14  Yes Gaylord Shih, MD  ibuprofen (ADVIL,MOTRIN) 600 MG tablet Take 1 tablet (600 mg total) by mouth every 6 (six) hours as needed for moderate pain. 01/23/15   Loren Racer, MD  methocarbamol (ROBAXIN) 500 MG tablet Take 2 tablets (1,000 mg total) by mouth every 8 (eight) hours as needed for muscle spasms. 01/23/15   Loren Racer, MD   BP 172/107 mmHg  Pulse 85  Temp(Src) 98.4 F (36.9 C) (Oral)  Resp 16  SpO2 96% Physical Exam  Constitutional: He is oriented to person, place, and time. He appears well-developed and well-nourished. No distress.  HENT:  Head: Normocephalic and atraumatic.  Mouth/Throat: Oropharynx is clear and moist.  Eyes: EOM are normal. Pupils are equal, round, and reactive to light.  Neck: Normal range of motion. Neck supple.  Cardiovascular: Normal rate and regular rhythm.   Pulmonary/Chest: Effort normal and breath sounds normal. No respiratory distress. He has no wheezes. He has no rales. He exhibits no tenderness.  Abdominal: Soft. Bowel sounds are normal. He exhibits no distension and no mass. There is no tenderness. There is no rebound and no guarding.  Musculoskeletal: Normal range of motion. He exhibits tenderness. He exhibits no edema.  Patient has bilateral lower lumbar paraspinal tenderness to palpation. Negative straight leg raise bilaterally. Distal pulses  are equal and intact. No calf swelling or tenderness.  Neurological: He is alert and oriented to person, place, and time.  Questionable right lower extremity weakness. Sensation is fully intact. No saddle anesthesia.  Skin: Skin is warm and dry. No rash noted. No erythema.  Psychiatric: He has a normal mood and affect. His behavior is normal.  Nursing note and vitals reviewed.   ED Course  Procedures   DIAGNOSTIC STUDIES:  Oxygen Saturation is 98% on RA, normal by my interpretation.    COORDINATION OF CARE:  1:03 AM Discussed treatment plan with pt at bedside and pt agreed to plan.  Labs Review Labs Reviewed  CBC - Abnormal; Notable for the following:    RBC 4.18 (*)    All other components within normal limits  BASIC METABOLIC PANEL  I-STAT TROPOININ, ED  I-STAT TROPOININ, ED    Imaging Review No results found. I have personally reviewed and evaluated these images and lab results as part of my medical decision-making.   EKG Interpretation   Date/Time:  Monday January 22 2015 22:44:30 EST Ventricular Rate:  94 PR Interval:  152 QRS Duration: 118 QT Interval:  370 QTC Calculation: 462 R Axis:   -22 Text Interpretation:  Normal sinus rhythm Possible Left atrial enlargement  Left ventricular hypertrophy with QRS widening and repolarization  abnormality Abnormal ECG Confirmed by Ranae Palms  MD, Juelz Whittenberg ([id number]) on  01/23/2015 12:38:17 AM      MDM   Final diagnoses:  Lumbar strain, initial encounter    I personally performed the services described in this documentation, which was scribed in my presence. The recorded information has been reviewed and is accurate.    Disc bulge on MRI.  Patient has a normal neurologic exam. Advised to follow-up with spinal surgery. We'll treat symptomatically. Patient states he is feeling much better he's resting well. He understands the need to return immediately for any worsening symptoms, fever, weakness, difficulty urinating or for  any concerns.  Loren Racer, MD 01/27/15 [phone]

## 2015-01-23 NOTE — Discharge Instructions (Signed)

## 2015-02-24 ENCOUNTER — Inpatient Hospital Stay (HOSPITAL_COMMUNITY): Payer: Medicaid Other

## 2015-02-24 ENCOUNTER — Encounter (HOSPITAL_COMMUNITY): Payer: Self-pay

## 2015-02-24 ENCOUNTER — Emergency Department (HOSPITAL_COMMUNITY): Payer: Medicaid Other

## 2015-02-24 ENCOUNTER — Inpatient Hospital Stay (HOSPITAL_COMMUNITY)
Admission: EM | Admit: 2015-02-24 | Discharge: 2015-02-26 | DRG: 064 | Disposition: A | Discharge status: Rehabilitation Facility | Payer: Medicaid Other | Attending: Internal Medicine | Admitting: Internal Medicine

## 2015-02-24 DIAGNOSIS — J9601 Acute respiratory failure with hypoxia: Secondary | ICD-10-CM | POA: Diagnosis present

## 2015-02-24 DIAGNOSIS — Y907 Blood alcohol level of 200-239 mg/100 ml: Secondary | ICD-10-CM | POA: Diagnosis present

## 2015-02-24 DIAGNOSIS — I10 Essential (primary) hypertension: Secondary | ICD-10-CM

## 2015-02-24 DIAGNOSIS — I639 Cerebral infarction, unspecified: Secondary | ICD-10-CM | POA: Diagnosis not present

## 2015-02-24 DIAGNOSIS — G92 Toxic encephalopathy: Secondary | ICD-10-CM | POA: Diagnosis present

## 2015-02-24 DIAGNOSIS — T405X1A Poisoning by cocaine, accidental (unintentional), initial encounter: Secondary | ICD-10-CM | POA: Diagnosis present

## 2015-02-24 DIAGNOSIS — D62 Acute posthemorrhagic anemia: Secondary | ICD-10-CM | POA: Diagnosis not present

## 2015-02-24 DIAGNOSIS — I6300 Cerebral infarction due to thrombosis of unspecified precerebral artery: Secondary | ICD-10-CM | POA: Diagnosis not present

## 2015-02-24 DIAGNOSIS — R29810 Facial weakness: Secondary | ICD-10-CM | POA: Diagnosis present

## 2015-02-24 DIAGNOSIS — I63511 Cerebral infarction due to unspecified occlusion or stenosis of right middle cerebral artery: Secondary | ICD-10-CM | POA: Diagnosis not present

## 2015-02-24 DIAGNOSIS — R410 Disorientation, unspecified: Secondary | ICD-10-CM | POA: Diagnosis not present

## 2015-02-24 DIAGNOSIS — R4182 Altered mental status, unspecified: Secondary | ICD-10-CM

## 2015-02-24 DIAGNOSIS — R29717 NIHSS score 17: Secondary | ICD-10-CM | POA: Diagnosis present

## 2015-02-24 DIAGNOSIS — T510X1A Toxic effect of ethanol, accidental (unintentional), initial encounter: Secondary | ICD-10-CM | POA: Diagnosis present

## 2015-02-24 DIAGNOSIS — I5042 Chronic combined systolic (congestive) and diastolic (congestive) heart failure: Secondary | ICD-10-CM | POA: Diagnosis not present

## 2015-02-24 DIAGNOSIS — G8194 Hemiplegia, unspecified affecting left nondominant side: Secondary | ICD-10-CM | POA: Diagnosis present

## 2015-02-24 DIAGNOSIS — F1010 Alcohol abuse, uncomplicated: Secondary | ICD-10-CM | POA: Diagnosis not present

## 2015-02-24 DIAGNOSIS — F10129 Alcohol abuse with intoxication, unspecified: Secondary | ICD-10-CM | POA: Diagnosis present

## 2015-02-24 DIAGNOSIS — E876 Hypokalemia: Secondary | ICD-10-CM | POA: Diagnosis not present

## 2015-02-24 DIAGNOSIS — F17210 Nicotine dependence, cigarettes, uncomplicated: Secondary | ICD-10-CM | POA: Diagnosis present

## 2015-02-24 DIAGNOSIS — Z7982 Long term (current) use of aspirin: Secondary | ICD-10-CM

## 2015-02-24 DIAGNOSIS — Z880 Allergy status to penicillin: Secondary | ICD-10-CM

## 2015-02-24 DIAGNOSIS — E785 Hyperlipidemia, unspecified: Secondary | ICD-10-CM | POA: Diagnosis present

## 2015-02-24 DIAGNOSIS — I638 Other cerebral infarction: Secondary | ICD-10-CM | POA: Diagnosis not present

## 2015-02-24 DIAGNOSIS — I110 Hypertensive heart disease with heart failure: Secondary | ICD-10-CM | POA: Diagnosis present

## 2015-02-24 DIAGNOSIS — R471 Dysarthria and anarthria: Secondary | ICD-10-CM | POA: Diagnosis not present

## 2015-02-24 DIAGNOSIS — J9690 Respiratory failure, unspecified, unspecified whether with hypoxia or hypercapnia: Secondary | ICD-10-CM

## 2015-02-24 DIAGNOSIS — I6389 Other cerebral infarction: Secondary | ICD-10-CM

## 2015-02-24 DIAGNOSIS — Z8249 Family history of ischemic heart disease and other diseases of the circulatory system: Secondary | ICD-10-CM

## 2015-02-24 DIAGNOSIS — I6350 Cerebral infarction due to unspecified occlusion or stenosis of unspecified cerebral artery: Secondary | ICD-10-CM | POA: Diagnosis not present

## 2015-02-24 DIAGNOSIS — F1410 Cocaine abuse, uncomplicated: Secondary | ICD-10-CM | POA: Diagnosis present

## 2015-02-24 DIAGNOSIS — I428 Other cardiomyopathies: Secondary | ICD-10-CM | POA: Diagnosis present

## 2015-02-24 HISTORY — DX: Cerebral infarction, unspecified: I63.9

## 2015-02-24 HISTORY — DX: Heart failure, unspecified: I50.9

## 2015-02-24 LAB — URINE MICROSCOPIC-ADD ON: WBC UA: NONE SEEN WBC/hpf (ref 0–5)

## 2015-02-24 LAB — DIFFERENTIAL
BASOS ABS: 0 10*3/uL (ref 0.0–0.1)
BASOS PCT: 0 %
EOS ABS: 0.1 10*3/uL (ref 0.0–0.7)
Eosinophils Relative: 1 %
LYMPHS ABS: 1.6 10*3/uL (ref 0.7–4.0)
Lymphocytes Relative: 29 %
MONOS PCT: 7 %
Monocytes Absolute: 0.4 10*3/uL (ref 0.1–1.0)
NEUTROS ABS: 3.5 10*3/uL (ref 1.7–7.7)
NEUTROS PCT: 63 %

## 2015-02-24 LAB — I-STAT TROPONIN, ED: Troponin i, poc: 0.03 ng/mL (ref 0.00–0.08)

## 2015-02-24 LAB — URINALYSIS, ROUTINE W REFLEX MICROSCOPIC
Bilirubin Urine: NEGATIVE
GLUCOSE, UA: NEGATIVE mg/dL
Ketones, ur: NEGATIVE mg/dL
LEUKOCYTES UA: NEGATIVE
Nitrite: NEGATIVE
PH: 5 (ref 5.0–8.0)
PROTEIN: NEGATIVE mg/dL
SPECIFIC GRAVITY, URINE: 1.021 (ref 1.005–1.030)

## 2015-02-24 LAB — I-STAT CHEM 8, ED
BUN: 11 mg/dL (ref 6–20)
Calcium, Ion: 1.05 mmol/L — ABNORMAL LOW (ref 1.12–1.23)
Chloride: 106 mmol/L (ref 101–111)
Creatinine, Ser: 1.2 mg/dL (ref 0.61–1.24)
Glucose, Bld: 88 mg/dL (ref 65–99)
HEMATOCRIT: 47 % (ref 39.0–52.0)
HEMOGLOBIN: 16 g/dL (ref 13.0–17.0)
POTASSIUM: 3.9 mmol/L (ref 3.5–5.1)
SODIUM: 140 mmol/L (ref 135–145)
TCO2: 21 mmol/L (ref 0–100)

## 2015-02-24 LAB — CBC
HCT: 40.6 % (ref 39.0–52.0)
Hemoglobin: 14.3 g/dL (ref 13.0–17.0)
MCH: 34.7 pg — ABNORMAL HIGH (ref 26.0–34.0)
MCHC: 35.2 g/dL (ref 30.0–36.0)
MCV: 98.5 fL (ref 78.0–100.0)
Platelets: 175 10*3/uL (ref 150–400)
RBC: 4.12 MIL/uL — ABNORMAL LOW (ref 4.22–5.81)
RDW: 12.7 % (ref 11.5–15.5)
WBC: 5.5 10*3/uL (ref 4.0–10.5)

## 2015-02-24 LAB — I-STAT ARTERIAL BLOOD GAS, ED
Acid-base deficit: 4 mmol/L — ABNORMAL HIGH (ref 0.0–2.0)
Bicarbonate: 23 mEq/L (ref 20.0–24.0)
O2 Saturation: 100 %
PCO2 ART: 47.3 mmHg — AB (ref 35.0–45.0)
PO2 ART: 568 mmHg — AB (ref 80.0–100.0)
Patient temperature: 98.6
TCO2: 24 mmol/L (ref 0–100)
pH, Arterial: 7.295 — ABNORMAL LOW (ref 7.350–7.450)

## 2015-02-24 LAB — COMPREHENSIVE METABOLIC PANEL
ALT: 30 U/L (ref 17–63)
AST: 42 U/L — ABNORMAL HIGH (ref 15–41)
Albumin: 4.1 g/dL (ref 3.5–5.0)
Alkaline Phosphatase: 84 U/L (ref 38–126)
Anion gap: 16 — ABNORMAL HIGH (ref 5–15)
BUN: 6 mg/dL (ref 6–20)
CO2: 20 mmol/L — ABNORMAL LOW (ref 22–32)
Calcium: 9.4 mg/dL (ref 8.9–10.3)
Chloride: 105 mmol/L (ref 101–111)
Creatinine, Ser: 0.95 mg/dL (ref 0.61–1.24)
GFR calc Af Amer: >60 mL/min (ref >60)
GFR calc non Af Amer: >60 mL/min (ref >60)
Glucose, Bld: 92 mg/dL (ref 65–99)
Potassium: 4 mmol/L (ref 3.5–5.1)
Sodium: 141 mmol/L (ref 135–145)
Total Bilirubin: 0.9 mg/dL (ref 0.3–1.2)
Total Protein: 6.8 g/dL (ref 6.5–8.1)

## 2015-02-24 LAB — RAPID URINE DRUG SCREEN, HOSP PERFORMED
Amphetamines: NOT DETECTED
BARBITURATES: NOT DETECTED
Benzodiazepines: POSITIVE — AB
COCAINE: POSITIVE — AB
OPIATES: NOT DETECTED
Tetrahydrocannabinol: NOT DETECTED

## 2015-02-24 LAB — PHOSPHORUS: PHOSPHORUS: 4 mg/dL (ref 2.5–4.6)

## 2015-02-24 LAB — APTT: APTT: 28 s (ref 24–37)

## 2015-02-24 LAB — PROTIME-INR
INR: 0.95 (ref 0.00–1.49)
Prothrombin Time: 12.9 seconds (ref 11.6–15.2)

## 2015-02-24 LAB — MAGNESIUM: MAGNESIUM: 1.8 mg/dL (ref 1.7–2.4)

## 2015-02-24 LAB — MRSA PCR SCREENING: MRSA BY PCR: NEGATIVE

## 2015-02-24 LAB — ETHANOL: ALCOHOL ETHYL (B): 214 mg/dL — AB (ref <5)

## 2015-02-24 MED ORDER — CHLORHEXIDINE GLUCONATE 0.12% ORAL RINSE (MEDLINE KIT)
15.0000 mL | Freq: Two times a day (BID) | OROMUCOSAL | Status: DC
  Administered 2015-02-24: 15 mL via OROMUCOSAL

## 2015-02-24 MED ORDER — PROPOFOL 1000 MG/100ML IV EMUL
INTRAVENOUS | Status: AC
  Filled 2015-02-24: qty 100

## 2015-02-24 MED ORDER — THIAMINE HCL 100 MG/ML IJ SOLN
Freq: Once | INTRAVENOUS | Status: AC
  Administered 2015-02-24: 13:00:00 via INTRAVENOUS
  Filled 2015-02-24: qty 1000

## 2015-02-24 MED ORDER — SODIUM CHLORIDE 0.9 % IV SOLN: 250.0000 mL | INTRAVENOUS | Status: DC | PRN

## 2015-02-24 MED ORDER — ENOXAPARIN SODIUM 40 MG/0.4ML ~~LOC~~ SOLN
40.0000 mg | Freq: Every day | SUBCUTANEOUS | Status: DC
  Administered 2015-02-24 – 2015-02-26 (×3): 40 mg via SUBCUTANEOUS
  Filled 2015-02-24 (×3): qty 0.4

## 2015-02-24 MED ORDER — SPIRONOLACTONE 25 MG PO TABS
12.5000 mg | ORAL_TABLET | Freq: Every day | ORAL | Status: DC
  Administered 2015-02-24 – 2015-02-26 (×3): 12.5 mg via ORAL
  Filled 2015-02-24 (×3): qty 1

## 2015-02-24 MED ORDER — ETOMIDATE 2 MG/ML IV SOLN
20.0000 mg | Freq: Once | INTRAVENOUS | Status: AC
  Administered 2015-02-24: 20 mg via INTRAVENOUS

## 2015-02-24 MED ORDER — HALOPERIDOL LACTATE 5 MG/ML IJ SOLN
5.0000 mg | Freq: Once | INTRAMUSCULAR | Status: AC
  Administered 2015-02-24: 5 mg via INTRAMUSCULAR

## 2015-02-24 MED ORDER — FUROSEMIDE 10 MG/ML IJ SOLN
40.0000 mg | Freq: Once | INTRAMUSCULAR | Status: AC
  Administered 2015-02-24: 40 mg via INTRAVENOUS
  Filled 2015-02-24: qty 4

## 2015-02-24 MED ORDER — LISINOPRIL 20 MG PO TABS
20.0000 mg | ORAL_TABLET | Freq: Every day | ORAL | Status: DC
  Administered 2015-02-24 – 2015-02-26 (×3): 20 mg via ORAL
  Filled 2015-02-24 (×3): qty 1

## 2015-02-24 MED ORDER — ASPIRIN EC 325 MG PO TBEC
325.0000 mg | DELAYED_RELEASE_TABLET | Freq: Every day | ORAL | Status: DC
  Administered 2015-02-24 – 2015-02-26 (×3): 325 mg via ORAL
  Filled 2015-02-24 (×3): qty 1

## 2015-02-24 MED ORDER — LORAZEPAM 2 MG/ML IJ SOLN
2.0000 mg | Freq: Once | INTRAMUSCULAR | Status: AC
  Administered 2015-02-24: 2 mg via INTRAVENOUS
  Filled 2015-02-24: qty 1

## 2015-02-24 MED ORDER — PROPOFOL 1000 MG/100ML IV EMUL
5.0000 ug/kg/min | Freq: Once | INTRAVENOUS | Status: AC
  Administered 2015-02-24: 20 ug/kg/min via INTRAVENOUS
  Administered 2015-02-24: 60 ug/kg/min via INTRAVENOUS

## 2015-02-24 MED ORDER — HALOPERIDOL LACTATE 5 MG/ML IJ SOLN
INTRAMUSCULAR | Status: AC
  Filled 2015-02-24: qty 1

## 2015-02-24 MED ORDER — ANTISEPTIC ORAL RINSE SOLUTION (CORINZ)
7.0000 mL | Freq: Four times a day (QID) | OROMUCOSAL | Status: DC
  Administered 2015-02-24 (×2): 7 mL via OROMUCOSAL

## 2015-02-24 MED ORDER — LORAZEPAM 2 MG/ML IJ SOLN: 2.0000 mg | INTRAMUSCULAR | Status: DC | PRN

## 2015-02-24 MED ORDER — PROPOFOL 1000 MG/100ML IV EMUL
5.0000 ug/kg/min | Freq: Once | INTRAVENOUS | Status: AC
Start: 2015-02-24 — End: 2015-02-24
  Administered 2015-02-24: 40 ug/kg/min via INTRAVENOUS

## 2015-02-24 MED ORDER — LABETALOL HCL 5 MG/ML IV SOLN: 0.5000 mg/min | INTRAVENOUS | Status: DC

## 2015-02-24 MED ORDER — SUCCINYLCHOLINE CHLORIDE 20 MG/ML IJ SOLN
150.0000 mg | Freq: Once | INTRAMUSCULAR | Status: AC
  Administered 2015-02-24: 150 mg via INTRAVENOUS

## 2015-02-24 NOTE — ED Notes (Addendum)
Per [facility], pt from home, LSN at 0200, unk if awake or asleep. Wife called 911. Pt has right sided gaze, left sided weakness and garbled speech. Hx of migraines and CHF. Pt was combative and tried to come off the stretcher, given 5 mg versed. CBG of 100, 167/83, 88 on HR. 18g to LAC.   Pt continues to be combative with staff.

## 2015-02-24 NOTE — Progress Notes (Signed)
Patient transported on vent from MRI to room 18M-09 without complications.  Patient returned to 40% FiO2.

## 2015-02-24 NOTE — Progress Notes (Signed)
EEG Completed; Results Pending  

## 2015-02-24 NOTE — Progress Notes (Signed)
PULMONARY / CRITICAL CARE MEDICINE HISTORY AND PHYSICAL EXAMINATION   Name: [name] MRN: [id number] DOB: 1965/10/01    ADMISSION DATE:  02/24/2015  PRIMARY SERVICE: PCCM  CHIEF COMPLAINT:  AMS  BRIEF PATIENT DESCRIPTION: 50 yh/o man with hx of polysubstance abuse and NICM p/w sudden onset AMS and combative behavior requiring intubation.  SIGNIFICANT EVENTS / STUDIES:  Presented as code stroke Intubated in ED CT of head unremarkable   LINES / TUBES: OETT 7.5 2/4 >> PIV OGT 2/4 2/4>> Foley 16 fr 2/4>>  CULTURES: Urine, 2/4 >>  ANTIBIOTICS: None  HISTORY OF PRESENT ILLNESS:   Mr. [name] is a 50 y/o man with a PMHx of NICM, polysubstance abuse (EtOH and cocaine) who presented to the ED as a code stroke when he developed sudden AMS. Per his significant other, her and him had been doing well without problems and had dinner with and old family friend. They finished up around 1:00 AM, and she saw him well at around 2:00 AM. He requested food (an usual request) and when she returned, she found him unresponsive with some convulsive-like movements. He had confused and slurred speech, so EMS was called and a code stroke begun. A Head CT did not show any acute event. He remained agitated, so PCCM was called for admission. tPA was not given due to lack of clinical exam with persistent defects.  SUBJECTIVE: No events overnight, sedate on propofol.  VITAL SIGNS: Temp:  [95.4 F (35.2 C)-98.2 F (36.8 C)] 98.2 F (36.8 C) (02/04 1000) Pulse Rate:  [70-120] 85 (02/04 1000) Resp:  [13-35] 16 (02/04 1000) BP: (75-186)/(58-138) 86/58 mmHg (02/04 1000) SpO2:  [96 %-100 %] 100 % (02/04 1000) FiO2 (%):  [40 %-100 %] 40 % (02/04 0800) Weight:  [68.176 kg (150 lb 4.8 oz)] 68.176 kg (150 lb 4.8 oz) (02/04 0347) HEMODYNAMICS:   VENTILATOR SETTINGS: Vent Mode:  [-] PRVC FiO2 (%):  [40 %-100 %] 40 % Set Rate:  [16 bmp] 16 bmp Vt Set:  [550 mL] 550 mL PEEP:  [5 cmH20] 5 cmH20 Plateau  Pressure:  [16 cmH20-17 cmH20] 17 cmH20 INTAKE / OUTPUT: Intake/Output      02/03 0701 - 02/04 0700 02/04 0701 - 02/05 0700   I.V. (mL/kg) 1089.2 (16) 89.2 (1.3)   Total Intake(mL/kg) 1089.2 (16) 89.2 (1.3)   Urine (mL/kg/hr)  100 (0.3)   Total Output   100   Net +1089.2 -10.8         PHYSICAL EXAMINATION: General:  Intubated and sedated, calm Neuro:  Vigorus cough/gag, PERRL HEENT:  ETT and OGT in place Neck: No LAD Cardiovascular:  Normal heart rate, sounds Lungs:  CTAB Abdomen:  No distention / obstruction. Musculoskeletal:  No swollen joints Skin:  No rashes.  LABS:  CBC  Recent Labs Lab 02/24/15 0303 02/24/15 0312  WBC 5.5  --   HGB 14.3 16.0  HCT 40.6 47.0  PLT 175  --    Coag's  Recent Labs Lab 02/24/15 0303  APTT 28  INR 0.95   BMET  Recent Labs Lab 02/24/15 0303 02/24/15 0312  NA 141 140  K 4.0 3.9  CL 105 106  CO2 20*  --   BUN 6 11  CREATININE 0.95 1.20  GLUCOSE 92 88   Electrolytes  Recent Labs Lab 02/24/15 0303  CALCIUM 9.4   Sepsis Markers No results for input(s): LATICACIDVEN, PROCALCITON, O2SATVEN in the last 168 hours. ABG  Recent Labs Lab 02/24/15 0422  PHART 7.295*  PCO2ART 47.3*  PO2ART 568.0*   Liver Enzymes  Recent Labs Lab 02/24/15 0303  AST 42*  ALT 30  ALKPHOS 84  BILITOT 0.9  ALBUMIN 4.1   Cardiac Enzymes No results for input(s): TROPONINI, PROBNP in the last 168 hours. Glucose No results for input(s): GLUCAP in the last 168 hours.  Imaging Ct Head Wo Contrast  02/24/2015  CLINICAL DATA:  Code stroke. Left-sided weakness. Left facial droop. EXAM: CT HEAD WITHOUT CONTRAST TECHNIQUE: Contiguous axial images were obtained from the base of the skull through the vertex without intravenous contrast. COMPARISON:  Remote head CT 05/07/2004 FINDINGS: No intracranial hemorrhage, mass effect, or midline shift. No hydrocephalus. The basilar cisterns are patent. No evidence of territorial infarct. No intracranial  fluid collection. Calvarium is intact. Included paranasal sinuses and mastoid air cells are well aerated. IMPRESSION: No acute intracranial abnormality on noncontrast head CT. No intracranial hemorrhage. These results were called by telephone at the time of interpretation on 02/24/2015 at 3:15 am to Dr. Roseanne Reno , who verbally acknowledged these results. Electronically Signed   By: Rubye Oaks M.D.   On: 02/24/2015 03:17   Mr Brain Wo Contrast  02/24/2015  CLINICAL DATA:  50 year old male code stroke with altered mental status and decreased left extremity movement. Initial encounter. EXAM: MRI HEAD WITHOUT CONTRAST TECHNIQUE: Multiplanar, multiecho pulse sequences of the brain and surrounding structures were obtained without intravenous contrast. COMPARISON:  Head CT without contrast hours today. Brain MRI 05/08/2004. FINDINGS: Major intracranial vascular flow voids are stable. There is a small cortically based area of restricted diffusion in the lateral right peri-Rolandic cortex encompassing about 15 mm (series 4, image 31). No associated hemorrhage or mass effect. No other restricted diffusion. Other gray and white matter signal is within normal limits ; asymmetric sulcus FLAIR signal at the right operculum on is not correlated with signal abnormality on any other sequence. No chronic cerebral blood products. The patient is intubated. Fluid in the pharynx. No midline shift, mass effect, evidence of mass lesion, ventriculomegaly, extra-axial collection or acute intracranial hemorrhage. Cervicomedullary junction and pituitary are within normal limits. Negative visualized cervical spine. Normal bone marrow signal. Mild mastoid effusions. Mild to moderate paranasal sinus mucosal thickening. Left superior convexity scalp soft tissue scarring (series 9, image 76) is chronic. IMPRESSION: 1. Small acute cortically based infarct in the lateral right peri-Rolandic cortex, right MCA territory. No associated hemorrhage  or mass effect. 2. Otherwise negative noncontrast MRI appearance of the brain. 3. Intubated. Electronically Signed   By: Odessa Fleming M.D.   On: 02/24/2015 06:41   Dg Chest Portable 1 View  02/24/2015  CLINICAL DATA:  Intubation. EXAM: PORTABLE CHEST 1 VIEW COMPARISON:  01/22/2015 FINDINGS: Endotracheal tube with tip at the clavicular heads. An orogastric tube reaches the stomach at least. Cardiomegaly with normal aortic and hilar contours. Clear lungs. No effusion or pneumothorax. IMPRESSION: Endotracheal and orogastric tubes are in good position. Cardiomegaly. Electronically Signed   By: Marnee Spring M.D.   On: 02/24/2015 04:03    EKG: LVH w/ nonspecific ST changes CXR: appears normal, ett in place  ASSESSMENT / PLAN:  Active Problems:   Delirium   Altered mental status   Cerebrovascular accident (CVA) (HCC)   PULMONARY A: Need for mechanical ventilation due to agitation per EDP. P:   Wean to extubate today. Titrate O2 for sat of 88-92%. Monitor airway protection specially with agitation.  CARDIOVASCULAR A: NICM P:   Echo pending. Home meds.  RENAL A: No  active problems P:   BMET in AM. Replace electrolytes as indicated.  GASTROINTESTINAL A: Need for nutrition P:   Heart healthy diet post extubation.  HEMATOLOGIC A: No active issues P:   CBC in AM.  INFECTIOUS A: No active Issues P:   Monitor  ENDOCRINE A: No active issues P:   Monitor  NEUROLOGIC A: AMS Polystubstance use P:   F/u MRI with a small CVA. F/u neuro recs. PRN ativan for CIWA >8 UDS noted. Thiamine/folate/MVI started.  TODAY'S SUMMARY: 50 y/o man with polysubstance abuse presents .  The patient is critically ill with multiple organ systems failure and requires high complexity decision making for assessment and support, frequent evaluation and titration of therapies, application of advanced monitoring technologies and extensive interpretation of multiple databases.   Critical  Care Time devoted to patient care services described in this note is  45  Minutes. This time reflects time of care of this signee Dr Koren Bound. This critical care time does not reflect procedure time, or teaching time or supervisory time of PA/NP/Med student/Med Resident etc but could involve care discussion time.  Alyson Reedy, M.D. [facility] Pulmonary/Critical Care Medicine. Pager: [phone]. After hours pager: [phone].  02/24/2015, 11:17 AM

## 2015-02-24 NOTE — Progress Notes (Signed)
VASCULAR LAB PRELIMINARY  PRELIMINARY  PRELIMINARY  PRELIMINARY  Carotid duplex  completed.    Preliminary report:  Bilateral:  1-39% ICA stenosis.  Vertebral artery flow is antegrade.      Ben Habermann, RVT 02/24/2015, 12:16 PM

## 2015-02-24 NOTE — Progress Notes (Signed)
STROKE TEAM PROGRESS NOTE   HISTORY At the time of admission: [name] is an 50 y.o. male history of hypertension and heart failure brought to the emergency room and code stroke status for acute onset of slurred speech, altered mental status and reduced movements of left extremities. Patient was last known well at 2:00 AM today. His fiance indicated that he had sudden change in mental status and speech was garbled and he was not moving his left side. EMS noticed lack of movement of left side as well as facial droop and slurred speech. He became agitated in route to the emergency room and was given milligrams of Versed. He received an additional 2 mg of Ativan, as well as 5 mg of Haldol. Patient remained markedly agitated. Initially appeared to have left-sided weakness which subsequently resolved, as did his gaze to the right side. CT scan of his head showed no acute intracranial abnormality. Patient was intubated and placed on mechanical ventilation, as well as on propofol for sedation. He has no previous history of stroke nor TIA. There is also no history of his activity. He reportedly had not been drinking alcohol. Blood alcohol and urine drug screen are pending. Treatment intervention with TPA was deferred when patient's focal deficits resolved.   SUBJECTIVE (INTERVAL HISTORY) His family is at the bedside.  He is sedated on propofol.  During exam patient became agitated and was observed to have purposeful movement but did not follow commands  OBJECTIVE Temp:  [95.4 F (35.2 C)-96.6 F (35.9 C)] 95.7 F (35.4 C) (02/04 0800) Pulse Rate:  [71-120] 71 (02/04 0800) Cardiac Rhythm:  [-]  Resp:  [13-35] 16 (02/04 0800) BP: (75-186)/(58-138) 108/76 mmHg (02/04 0800) SpO2:  [96 %-100 %] 100 % (02/04 0800) FiO2 (%):  [40 %-100 %] 40 % (02/04 0741) Weight:  [68.176 kg (150 lb 4.8 oz)] 68.176 kg (150 lb 4.8 oz) (02/04 0347)  CBC:  Recent Labs Lab 02/24/15 0303 02/24/15 0312  WBC 5.5  --    NEUTROABS 3.5  --   HGB 14.3 16.0  HCT 40.6 47.0  MCV 98.5  --   PLT 175  --     Basic Metabolic Panel:  Recent Labs Lab 02/24/15 0303 02/24/15 0312  NA 141 140  K 4.0 3.9  CL 105 106  CO2 20*  --   GLUCOSE 92 88  BUN 6 11  CREATININE 0.95 1.20  CALCIUM 9.4  --     Lipid Panel: No results found for: CHOL, TRIG, HDL, CHOLHDL, VLDL, LDLCALC HgbA1c: No results found for: HGBA1C Urine Drug Screen:    Component Value Date/Time   LABOPIA NONE DETECTED 02/24/2015 0418   COCAINSCRNUR POSITIVE* 02/24/2015 0418   LABBENZ POSITIVE* 02/24/2015 0418   AMPHETMU NONE DETECTED 02/24/2015 0418   THCU NONE DETECTED 02/24/2015 0418   LABBARB NONE DETECTED 02/24/2015 0418    IMAGING  Ct Head Wo Contrast 02/24/2015   No acute intracranial abnormality on noncontrast head CT. No intracranial hemorrhage.   Mr Brain Wo Contrast 02/24/2015   1. Small acute cortically based infarct in the lateral right peri-Rolandic cortex, right MCA territory. No associated hemorrhage or mass effect.  2. Otherwise negative noncontrast MRI appearance of the brain.  3. Intubated.    Dg Chest Portable 1 View 02/24/2015   Endotracheal and orogastric tubes are in good position. Cardiomegaly.    PHYSICAL EXAM HEENT- Normocephalic, no lesions, without obvious abnormality. Normal external eye and conjunctiva.PERRL but sluggish  Neck supple with  no masses, nodes, nodules or enlargement. Cardiovascular - regular rate and rhythm, S1, S2 normal, no murmur, click, rub or gallop Lungs - chest clear, no wheezing, rales, normal symmetric air entry Abdomen - soft, non-tender; bowel sounds normal; no masses, no organomegaly Extremities - no joint deformities, effusion, or inflammation and no edema  Neurologic Examination: Patient sedated on propofol with intermittent agitation; intubated at 2:30AM  CRANIAL NERVES Pupils were equal and reacted normally to light. Extraocular movements were intact with right and  left lateral eye movements. Face symmetry difficult to assess Corneals intact Positive gag  MOTOR/SENSORY:  Motor exam limited by sedation; however, when agitated, patient appears non-focal and purposeful.  He reaches toward ET tube with both hands  GAIT/COORDINATION:  Deferred at this time  ASSESSMENT/PLAN Mr. CRAY MONNIN is a 50 y.o. male with history of hypertension, tobacco use, alcohol use, headaches, and congestive heart failure presenting with transitory left hemiparesis, slurred speech, altered mental status, right gaze preference, and agitation. He did not receive IV t-PA due to resolution of left hemiparesis.  Stroke:  Non-dominant right middle cerebral artery territory infarct possibly embolic from unknown source.  Resultant  Agitation; exam limited by sedation; when sedation weaned will be better able to assess for focality  MRI  Small acute cortically based infarct in the lateral right peri-Rolandic cortex, right MCA territory.  MRA - not performed  Carotid Doppler - will order  2D Echo- pending  LDL will order  HgbA1c - will order  VTE prophylaxis - Lovenox  Diet Heart Room service appropriate?: Yes; Fluid consistency:: Thin  aspirin 325 mg daily prior to admission, now on aspirin 325 mg daily  Patient counseled to be compliant with his antithrombotic medications  Ongoing aggressive stroke risk factor management  Therapy recommendations: Pending  Disposition: Pending  Hypertension  Low at times  Permissive hypertension (OK if < 220/120) but gradually normalize in 5-7 days   Hyperlipidemia  Home meds: No lipid lowering medications prior to admission.  LDL pending, goal < 70  Other Stroke Risk Factors  Cigarette smoker, advised to stop smoking  ETOH use  Coronary artery disease  Cocaine abuse   Other Active Problems  UDS positive for cocaine  Hospital day # 0  Delton See PA-C [facility] Pager [phone] 02/24/2015, 8:50 AM  CRITICAL CARE NEUROLOGY ATTENDING NOTE Patient was seen and examined by me personally. I reviewed notes, independently viewed imaging studies, participated in medical decision making and plan of care. I have made additions or clarifications directly to the above note.  Documentation accurately reflects findings. The laboratory and radiographic studies were personally reviewed by me.  ROS pertinent positives could not be fully documented due to LOC  Assessment and plan completed by me personally and fully documented above.  Condition is worsened overnight with agitation  This patient is critically ill and at significant risk of neurological worsening, death and care requires constant monitoring of vital signs, hemodynamics,respiratory and cardiac monitoring, extensive review of multiple databases, frequent neurological assessment, discussion with family, other specialists and medical decision making of high complexity.  This critical care time does not reflect procedure time, or teaching time or supervisory time of PA/NP/Med Resident etc. but could involve care discussion time.  I spent 30 minutes of Neurocritical Care time in the care of  this patient.  SIGNED BY: Dr. Sula Soda     To contact Stroke Continuity provider, please refer to [email or web address]. After hours, contact General Neurology

## 2015-02-24 NOTE — ED Notes (Signed)
Pt now in MRI with this RN and RT.

## 2015-02-24 NOTE — Consult Note (Signed)
Admission H&P    Chief Complaint: Altered mental status with agitation.  HPI: [name] is an 50 y.o. male history of hypertension and heart failure brought to the emergency room and code stroke status for acute onset of slurred speech, altered mental status and reduced movements of left extremities. Patient was last known well at 2:00 AM today. His fiance indicated that he had sudden change in mental status and speech was garbled and he was not moving his left side. EMS noticed lack of movement of left side as well as facial droop and slurred speech. He became agitated in route to the emergency room and was given milligrams of Versed. He received an additional 2 mg of Ativan, as well as 5 mg of Haldol. Patient remained markedly agitated. Initially appeared to have left-sided weakness which subsequently resolved, as did his gaze to the right side. CT scan of his head showed no acute intracranial abnormality. Patient was intubated and placed on mechanical ventilation, as well as on propofol for sedation. He has no previous history of stroke nor TIA. There is also no history of his activity. He reportedly had not been drinking alcohol. Blood alcohol and urine drug screen are pending. Treatment intervention with TPA was deferred when patient's focal deficits resolved.  Past Medical History  Diagnosis Date  . Hypertension   . Headache   . CHF (congestive heart failure) [facility])     Past Surgical History  Procedure Laterality Date  . No past surgeries      Family History  Problem Relation Age of Onset  . Hypertension Mother    Social History:  reports that he has been smoking Cigarettes.  He has a 5 pack-year smoking history. He does not have any smokeless tobacco history on file. He reports that he drinks alcohol. He reports that he uses illicit drugs (Cocaine).  Allergies:  Allergies  Allergen Reactions  . Penicillins Other (See Comments)    Childhood     Medications: Patient's  preadmission medications were reviewed by me.  ROS: History obtained from patient's fianc.  General ROS: negative for - chills, fatigue, fever, night sweats, weight gain or weight loss Psychological ROS: negative for - behavioral disorder, hallucinations, memory difficulties, mood swings or suicidal ideation Ophthalmic ROS: negative for - blurry vision, double vision, eye pain or loss of vision ENT ROS: negative for - epistaxis, nasal discharge, oral lesions, sore throat, tinnitus or vertigo Allergy and Immunology ROS: negative for - hives or itchy/watery eyes Hematological and Lymphatic ROS: negative for - bleeding problems, bruising or swollen lymph nodes Endocrine ROS: negative for - galactorrhea, hair pattern changes, polydipsia/polyuria or temperature intolerance Respiratory ROS: Occasional dyspnea on exertion Cardiovascular ROS: negative for - chest pain, dyspnea on exertion, edema or irregular heartbeat Gastrointestinal ROS: negative for - abdominal pain, diarrhea, hematemesis, nausea/vomiting or stool incontinence Genito-Urinary ROS: negative for - dysuria, hematuria, incontinence or urinary frequency/urgency Musculoskeletal ROS: negative for - joint swelling or muscular weakness Neurological ROS: as noted in HPI Dermatological ROS: negative for rash and skin lesion changes  Physical Examination: Blood pressure 123/88, pulse 80, temperature 95.4 F (35.2 C), temperature source Core (Comment), resp. rate 16, height '5\' 8"'  (1.727 m), weight 68.176 kg (150 lb 4.8 oz), SpO2 100 %.  HEENT-  Normocephalic, no lesions, without obvious abnormality.  Normal external eye and conjunctiva.  Normal TM's bilaterally.  Normal auditory canals and external ears. Normal external nose, mucus membranes and septum.  Normal pharynx. Neck supple with no masses,  nodes, nodules or enlargement. Cardiovascular - regular rate and rhythm, S1, S2 normal, no murmur, click, rub or gallop Lungs - chest clear, no  wheezing, rales, normal symmetric air entry Abdomen - soft, non-tender; bowel sounds normal; no masses,  no organomegaly Extremities - no joint deformities, effusion, or inflammation and no edema  Neurologic Examination: Patient was lethargic and markedly agitated. Speech was garbled. He followed no commands. Pupils were equal and reacted normally to light. Extraocular movements were intact with right and left lateral eye movements. Face was slightly asymmetrical with equivocal mild left lower facial weakness. Motor exam showed normal strength throughout, including left upper extremity proximally and distally. Deep tendon reflexes were 1+ and symmetrical. Plantar responses were flexor bilaterally.  Results for orders placed or performed during the hospital encounter of 02/24/15 (from the past 48 hour(s))  Protime-INR     Status: None   Collection Time: 02/24/15  3:03 AM  Result Value Ref Range   Prothrombin Time 12.9 11.6 - 15.2 seconds   INR 0.95 0.00 - 1.49  APTT     Status: None   Collection Time: 02/24/15  3:03 AM  Result Value Ref Range   aPTT 28 24 - 37 seconds  CBC     Status: Abnormal   Collection Time: 02/24/15  3:03 AM  Result Value Ref Range   WBC 5.5 4.0 - 10.5 K/uL   RBC 4.12 (L) 4.22 - 5.81 MIL/uL   Hemoglobin 14.3 13.0 - 17.0 g/dL   HCT 40.6 39.0 - 52.0 %   MCV 98.5 78.0 - 100.0 fL   MCH 34.7 (H) 26.0 - 34.0 pg   MCHC 35.2 30.0 - 36.0 g/dL   RDW 12.7 11.5 - 15.5 %   Platelets 175 150 - 400 K/uL  Differential     Status: None   Collection Time: 02/24/15  3:03 AM  Result Value Ref Range   Neutrophils Relative % 63 %   Neutro Abs 3.5 1.7 - 7.7 K/uL   Lymphocytes Relative 29 %   Lymphs Abs 1.6 0.7 - 4.0 K/uL   Monocytes Relative 7 %   Monocytes Absolute 0.4 0.1 - 1.0 K/uL   Eosinophils Relative 1 %   Eosinophils Absolute 0.1 0.0 - 0.7 K/uL   Basophils Relative 0 %   Basophils Absolute 0.0 0.0 - 0.1 K/uL  Comprehensive metabolic panel     Status: Abnormal    Collection Time: 02/24/15  3:03 AM  Result Value Ref Range   Sodium 141 135 - 145 mmol/L   Potassium 4.0 3.5 - 5.1 mmol/L   Chloride 105 101 - 111 mmol/L   CO2 20 (L) 22 - 32 mmol/L   Glucose, Bld 92 65 - 99 mg/dL   BUN 6 6 - 20 mg/dL   Creatinine, Ser 0.95 0.61 - 1.24 mg/dL   Calcium 9.4 8.9 - 10.3 mg/dL   Total Protein 6.8 6.5 - 8.1 g/dL   Albumin 4.1 3.5 - 5.0 g/dL   AST 42 (H) 15 - 41 U/L   ALT 30 17 - 63 U/L   Alkaline Phosphatase 84 38 - 126 U/L   Total Bilirubin 0.9 0.3 - 1.2 mg/dL   GFR calc non Af Amer >60 >60 mL/min   GFR calc Af Amer >60 >60 mL/min    Comment: (NOTE) The eGFR has been calculated using the CKD EPI equation. This calculation has not been validated in all clinical situations. eGFR's persistently <60 mL/min signify possible Chronic Kidney Disease.  Anion gap 16 (H) 5 - 15  I-stat troponin, ED (not at [facility])     Status: None   Collection Time: 02/24/15  3:10 AM  Result Value Ref Range   Troponin i, poc 0.03 0.00 - 0.08 ng/mL   Comment 3            Comment: Due to the release kinetics of cTnI, a negative result within the first hours of the onset of symptoms does not rule out myocardial infarction with certainty. If myocardial infarction is still suspected, repeat the test at appropriate intervals.   I-Stat Chem 8, ED  (not at [facility], [facility]     Status: Abnormal   Collection Time: 02/24/15  3:12 AM  Result Value Ref Range   Sodium 140 135 - 145 mmol/L   Potassium 3.9 3.5 - 5.1 mmol/L   Chloride 106 101 - 111 mmol/L   BUN 11 6 - 20 mg/dL   Creatinine, Ser 1.20 0.61 - 1.24 mg/dL   Glucose, Bld 88 65 - 99 mg/dL   Calcium, Ion 1.05 (L) 1.12 - 1.23 mmol/L   TCO2 21 0 - 100 mmol/L   Hemoglobin 16.0 13.0 - 17.0 g/dL   HCT 47.0 39.0 - 52.0 %   Ct Head Wo Contrast  02/24/2015  CLINICAL DATA:  Code stroke. Left-sided weakness. Left facial droop. EXAM: CT HEAD WITHOUT CONTRAST TECHNIQUE: Contiguous axial images were obtained from the base of the skull  through the vertex without intravenous contrast. COMPARISON:  Remote head CT 05/07/2004 FINDINGS: No intracranial hemorrhage, mass effect, or midline shift. No hydrocephalus. The basilar cisterns are patent. No evidence of territorial infarct. No intracranial fluid collection. Calvarium is intact. Included paranasal sinuses and mastoid air cells are well aerated. IMPRESSION: No acute intracranial abnormality on noncontrast head CT. No intracranial hemorrhage. These results were called by telephone at the time of interpretation on 02/24/2015 at 3:15 am to Dr. Nicole Kindred , who verbally acknowledged these results. Electronically Signed   By: Jeb Levering M.D.   On: 02/24/2015 03:17   Dg Chest Portable 1 View  02/24/2015  CLINICAL DATA:  Intubation. EXAM: PORTABLE CHEST 1 VIEW COMPARISON:  01/22/2015 FINDINGS: Endotracheal tube with tip at the clavicular heads. An orogastric tube reaches the stomach at least. Cardiomegaly with normal aortic and hilar contours. Clear lungs. No effusion or pneumothorax. IMPRESSION: Endotracheal and orogastric tubes are in good position. Cardiomegaly. Electronically Signed   By: Monte Fantasia M.D.   On: 02/24/2015 04:03    Assessment/Plan 50 year old man with a history of hypertension and heart failure presenting with altered mental status confusion and marked agitation as well as general weakness involving face and left extremities. Seizure with postictal state is suspected. Acute TIA or stroke is less likely but cannot be completely ruled out. Treatment intervention with TPA was deferred after patient's eft extremity strength returned to normal and facial asymmetry improved.  Recommendations: 1. MRI of the brain without contrast to rule out possible acute stroke; stroke workup with risk assessment if an acute ischemic lesion is demonstrated 2. EEG, routine about study 3. No anticonvulsant medication was and has a witnessed seizure, or EEG indicates increased risk for  seizures 4. Continue aspirin daily  This patient is critically ill and at significant risk of neurological worsening or death, and care requires constant monitoring of vital signs, hemodynamics,respiratory and cardiac monitoring, neurological assessment, discussion with family, other specialists and medical decision making of high complexity. Total critical care time was 90 minutes.  Saddie Benders, MD [facility] [phone]  02/24/2015, 4:24 AM

## 2015-02-24 NOTE — Procedures (Signed)
History: 50 year old male presented with acute encephalopathy and left-sided weakness in the setting of cocaine use  Sedation: Propofol  Technique: This is a 21 channel routine scalp EEG performed at the bedside with bipolar and monopolar montages arranged in accordance to the international 10/20 system of electrode placement. One channel was dedicated to EKG recording.    Background: The background consists predominantly of beta activities which are bifrontally predominant. There is mild low voltage irregular generalized health activity. With stimulation of posterior dominant rhythm of 8 Hz is observed briefly. There are structures resembling sleep spindles at times. These are symmetrically organized.  Photic stimulation: Physiologic driving is performed  EEG Abnormalities: 1) mild generalized irregular slow activity 2) excessive beta activity  Clinical Interpretation: This mildly abnormal EEG is most consistent with a mild generalized non-specific cerebral dysfunction(encephalopathy) as can be seen with sedating medications such as propofol. There is no evidence of seizure or seizure predisposition on this study.  Ritta Slot, MD [facility] [phone]  If 7pm- 7am, please page neurology on call as listed in AMION.

## 2015-02-24 NOTE — Procedures (Signed)
Extubation Procedure Note  Patient Details:   Name: [name] DOB: March 08, 1965 MRN: [id number]   Airway Documentation:  Airway 7.5 mm (Active)  Secured at (cm) 23 cm 02/24/2015  7:41 AM  Measured From Lips 02/24/2015  7:41 AM  Secured Location Left 02/24/2015  7:41 AM  Secured By [employer] 02/24/2015  7:41 AM  Tube Holder Repositioned Yes 02/24/2015  7:41 AM  Cuff Pressure (cm H2O) 22 cm H2O 02/24/2015  7:41 AM  Site Condition Dry 02/24/2015  7:41 AM   Pt extuabted to 4lpm [place], no distress noted.  Evaluation  O2 sats: stable throughout Complications: No apparent complications Patient did tolerate procedure well.   Suctioning: Oral, Airway Yes  Jumar Greenstreet Tobi Bastos 02/24/2015, 11:52 AM

## 2015-02-24 NOTE — ED Notes (Signed)
Attempted report 

## 2015-02-24 NOTE — Progress Notes (Signed)
Code stroke called on 50 y.o male brought in per [facility] from home. Pertinent history includes HTN, CHF, head aches. Per wife Pt LSN at 0200, around 0210 Pt complained of head ache and had garbled speech. Per EMS Patient confused with garbled speech upon arrrival but able to walk to EMS truck. He became combative with severe agitation and confusion en route. Left side weakness noted with left facial droop and right side gaze. 5mg  Versed given per EMS while en route to [facility]. Upon arrival Pt cleared for CT per EDP Dr. Bebe Shaggy. Once in CT scan Pt became more restless, delay in CT scan. CT completed with difficulty and reviewed as negative for bleed per Neurologist Dr. Roseanne Reno. Ativan 2mg  given upon arrival back to ED room. Pt severely agitated, [employer] and [facility] [employer] at bedside to keep Pt in bed and maintain safety. Haldol 5mg  IM given. Initially at 0320 NIHSS 17, reassessed at 0330 yielding 15, and at 0340 Pt able to move left arm and leg with normal strength with right side gaze resolved, Patient able to track. NIHSS 10.  Agitation remained despite sedatives, Pt intubated for airway protection per EDP. Propofol gtt started for sedation.  TPA ordered initially from pharmacy but quickly canceled. Per Dr. Roseanne Reno Pt not a TPA candidate as he has mild symptoms which are quickly resolving. Pt for admit per PCCM, for MRI and stroke work up. Per Neurologist possible seizure differential diagnosis.

## 2015-02-24 NOTE — ED Notes (Signed)
Pt not responding to sedatives and preparing for intubation at this time.

## 2015-02-24 NOTE — ED Provider Notes (Signed)
CSN: [id number]     Arrival date & time 02/24/15  0254 History  By signing my name below, I, Terrance Branch, attest that this documentation has been prepared under the direction and in the presence of Zadie Rhine, MD. Electronically Signed: Evon Slack, ED Scribe. 02/24/2015. 3:23 AM.      Chief Complaint  Patient presents with  . Code Stroke   The history is provided by the EMS personnel. No language interpreter was used.   HPI Comments: Level 5 Caveat: AMS [name] is a 50 y.o. male brought in by ambulance, who presents to the Emergency Department for possible stroke onset PTA. Per Ems pt was last seen normal at 2 AM. States that the pt was complaining of right sided HA and presents with left side deficits. Pt is combative in the ED.  No other details known on arrival.     Past Medical History  Diagnosis Date  . Hypertension   . Headache   . CHF (congestive heart failure) [facility])    Past Surgical History  Procedure Laterality Date  . No past surgeries     Family History  Problem Relation Age of Onset  . Hypertension Mother    Social History  Substance Use Topics  . Smoking status: Current Some Day Smoker -- 0.25 packs/day for 20 years    Types: Cigarettes  . Smokeless tobacco: None  . Alcohol Use: 0.0 oz/week    0 Standard drinks or equivalent per week     Comment: soccially    Review of Systems  Unable to perform ROS: Mental status change     Allergies  Penicillins  Home Medications   Prior to Admission medications   Medication Sig Start Date End Date Taking? Authorizing Provider  aspirin 325 MG EC tablet Take 1 tablet (325 mg total) by mouth daily. 09/27/14   Gaylord Shih, MD  furosemide (LASIX) 40 MG tablet Take 2 tablets (80 mg total) by mouth daily. 09/27/14   Gaylord Shih, MD  ibuprofen (ADVIL,MOTRIN) 600 MG tablet Take 1 tablet (600 mg total) by mouth every 6 (six) hours as needed for moderate pain. 01/23/15   Loren Racer, MD  lisinopril  (PRINIVIL,ZESTRIL) 20 MG tablet Take 1 tablet (20 mg total) by mouth daily. 09/27/14   Gaylord Shih, MD  methocarbamol (ROBAXIN) 500 MG tablet Take 2 tablets (1,000 mg total) by mouth every 8 (eight) hours as needed for muscle spasms. 01/23/15   Loren Racer, MD  spironolactone (ALDACTONE) 25 MG tablet Take 0.5 tablets (12.5 mg total) by mouth daily. 09/27/14   Gaylord Shih, MD   BP 92/75 mmHg  Pulse 86  Resp 16  Wt 68.176 kg  SpO2 100%   Physical Exam  CONSTITUTIONAL: Well developed/well nourished, agitated and combative HEAD: Normocephalic/atraumatic EYES: EOMI/PERRL ENMT: Mucous membranes moist NECK: supple no meningeal signs CV: S1/S2 noted, no murmurs/rubs/gallops noted, tachycardic LUNGS: Lungs are clear to auscultation bilaterally, no apparent distress ABDOMEN: soft, nontender NEURO: Pt with left facial droop and significant dysarthria, rest of neuro exam difficult to complete due to agitation  EXTREMITIES: pulses normal/equal, full ROM SKIN: warm, color normal PSYCH: Anxious and agitated.   ED Course  Procedures  CRITICAL CARE Performed by: Joya Gaskins Total critical care time: 40 minutes Critical care time was exclusive of separately billable procedures and treating other patients. Critical care was necessary to treat or prevent imminent or life-threatening deterioration. Critical care was time spent personally by me on the following  activities: development of treatment plan with patient and/or surrogate as well as nursing, discussions with consultants, evaluation of patient's response to treatment, examination of patient, obtaining history from patient or surrogate, ordering and performing treatments and interventions, ordering and review of laboratory studies, ordering and review of radiographic studies, pulse oximetry and re-evaluation of patient's condition.   INTUBATION Performed by: Joya Gaskins  Required items: required blood products, implants, devices,  and special equipment available Patient identity confirmed: provided demographic data and hospital-assigned identification number Time out: Immediately prior to procedure a "time out" was called to verify the correct patient, procedure, equipment, support staff and site/side marked as required.  Indications: altered mental status  Intubation method: Glidescope Laryngoscopy   Preoxygenation: BVM  Sedatives: Etomidate Paralytic: Succinylcholine  Tube Size: 7.5 cuffed  Post-procedure assessment: chest rise and ETCO2 monitor Breath sounds: equal and absent over the epigastrium Tube secured with: ETT holder Chest x-ray interpreted by radiologist and me.  Chest x-ray findings: endotracheal tube in appropriate position  Patient tolerated the procedure well with no immediate complications except for small abrasion to lip but no dental injury    DIAGNOSTIC STUDIES: Oxygen Saturation is 96% on RA, adequate by my interpretation.    COORDINATION OF CARE:   Pt seen on arrival for code stroke He was very agitated but had dysarthria on exam CT head negative  seen by dr Roseanne Reno with neuro Initial concern for acute stroke and was going to give TPA However, his left sided weakness improved though still combative Due to his combativeness and alteration in mental status Pt was intubated After d/w dr Roseanne Reno he now feels this is possible seizure with postictal confusion/combativeness Will admit to critical care  tPA in stroke considered but not given due to: Symptoms resolving, code stroke cancelled by dr Roseanne Reno   4:18 AM D/w critical care We discussed case Will admit to ICU Bed orders were placed   Labs Review Labs Reviewed  CBC - Abnormal; Notable for the following:    RBC 4.12 (*)    MCH 34.7 (*)    All other components within normal limits  COMPREHENSIVE METABOLIC PANEL - Abnormal; Notable for the following:    CO2 20 (*)    AST 42 (*)    Anion gap 16 (*)    All other  components within normal limits  I-STAT CHEM 8, ED - Abnormal; Notable for the following:    Calcium, Ion 1.05 (*)    All other components within normal limits  URINE CULTURE  PROTIME-INR  APTT  DIFFERENTIAL  ETHANOL  URINE RAPID DRUG SCREEN, HOSP PERFORMED  URINALYSIS, ROUTINE W REFLEX MICROSCOPIC (NOT AT [facility])  I-STAT TROPOININ, ED  CBG MONITORING, ED    Imaging Review Ct Head Wo Contrast  02/24/2015  CLINICAL DATA:  Code stroke. Left-sided weakness. Left facial droop. EXAM: CT HEAD WITHOUT CONTRAST TECHNIQUE: Contiguous axial images were obtained from the base of the skull through the vertex without intravenous contrast. COMPARISON:  Remote head CT 05/07/2004 FINDINGS: No intracranial hemorrhage, mass effect, or midline shift. No hydrocephalus. The basilar cisterns are patent. No evidence of territorial infarct. No intracranial fluid collection. Calvarium is intact. Included paranasal sinuses and mastoid air cells are well aerated. IMPRESSION: No acute intracranial abnormality on noncontrast head CT. No intracranial hemorrhage. These results were called by telephone at the time of interpretation on 02/24/2015 at 3:15 am to Dr. Roseanne Reno , who verbally acknowledged these results. Electronically Signed   By: Lujean Rave.D.  On: 02/24/2015 03:17   Dg Chest Portable 1 View  02/24/2015  CLINICAL DATA:  Intubation. EXAM: PORTABLE CHEST 1 VIEW COMPARISON:  01/22/2015 FINDINGS: Endotracheal tube with tip at the clavicular heads. An orogastric tube reaches the stomach at least. Cardiomegaly with normal aortic and hilar contours. Clear lungs. No effusion or pneumothorax. IMPRESSION: Endotracheal and orogastric tubes are in good position. Cardiomegaly. Electronically Signed   By: Marnee Spring M.D.   On: 02/24/2015 04:03      EKG Interpretation   Date/Time:  Saturday February 24 2015 03:17:15 EST Ventricular Rate:  115 PR Interval:  149 QRS Duration: 122 QT Interval:  313 QTC Calculation:  433 R Axis:   -40 Text Interpretation:  Sinus tachycardia Biatrial enlargement RBBB and LAFB  LVH with secondary repolarization abnormality Baseline wander in lead(s)  V2 V5 Abnormal ekg No significant change since last tracing Confirmed by  Bebe Shaggy  MD, Dazia Lippold ([id number]) on 02/24/2015 3:27:07 AM      MDM   Final diagnoses:  Delirium  Cerebrovascular accident (CVA) due to other mechanism [facility])       Nursing notes including past medical history and social history reviewed and considered in documentation Labs/vital reviewed myself and considered during evaluation xrays/imaging reviewed by myself and considered during evaluation   I personally performed the services described in this documentation, which was scribed in my presence. The recorded information has been reviewed and is accurate.       Zadie Rhine, MD 02/24/15 [phone]

## 2015-02-24 NOTE — ED Notes (Signed)
Dr. Eugenia Pancoast at the bedside.

## 2015-02-24 NOTE — H&P (Signed)
PULMONARY / CRITICAL CARE MEDICINE HISTORY AND PHYSICAL EXAMINATION   Name: [name] MRN: [id number] DOB: 05-09-1965    ADMISSION DATE:  02/24/2015  PRIMARY SERVICE: PCCM  CHIEF COMPLAINT:  AMS  BRIEF PATIENT DESCRIPTION: 50 yh/o man with hx of polysubstance abuse and NICM p/w sudden onset AMS and combative behavior requiring intubation.  SIGNIFICANT EVENTS / STUDIES:  Presented as code stroke Intubated in ED CT of head unremarkable   LINES / TUBES: OETT 7.5 2/4 >> PIV OGT 2/4 2/4>> Foley 16 fr 2/4>>  CULTURES: Urine, 2/4 >>  ANTIBIOTICS: None  HISTORY OF PRESENT ILLNESS:   [name] is a 50 y/o man with a PMHx of NICM, polysubstance abuse (EtOH and cocaine) who presented to the ED as a code stroke when he developed sudden AMS. Per his significant other, her and she had been doing well without problems and had dinner with and old family friend. They finished up around 1:00 AM, and she saw him well at around 2:00 AM. He requested food (an usual request) and when she returned, she found him unresponsive with some convulsive-like movements. He had confused and slurred speech, so EMS was called and a code stroke begun. A Head CT did not show any acute event. He remained agitated, so PCCM was called for admission. tPA was not given due to lack of clinical exam with persistent defects   PAST MEDICAL HISTORY :  Past Medical History  Diagnosis Date  . Hypertension   . Headache   . CHF (congestive heart failure) [facility])    Past Surgical History  Procedure Laterality Date  . No past surgeries     Prior to Admission medications   Medication Sig Start Date End Date Taking? Authorizing Provider  aspirin 325 MG EC tablet Take 1 tablet (325 mg total) by mouth daily. 09/27/14  Yes Gaylord Shih, MD  furosemide (LASIX) 40 MG tablet Take 2 tablets (80 mg total) by mouth daily. 09/27/14  Yes Gaylord Shih, MD  ibuprofen (ADVIL,MOTRIN) 600 MG tablet Take 1 tablet (600 mg total) by mouth  every 6 (six) hours as needed for moderate pain. 01/23/15  Yes Loren Racer, MD  lisinopril (PRINIVIL,ZESTRIL) 20 MG tablet Take 1 tablet (20 mg total) by mouth daily. 09/27/14  Yes Gaylord Shih, MD  methocarbamol (ROBAXIN) 500 MG tablet Take 2 tablets (1,000 mg total) by mouth every 8 (eight) hours as needed for muscle spasms. 01/23/15  Yes Loren Racer, MD  spironolactone (ALDACTONE) 25 MG tablet Take 0.5 tablets (12.5 mg total) by mouth daily. 09/27/14  Yes Gaylord Shih, MD   Allergies  Allergen Reactions  . Penicillins Other (See Comments)    Childhood     FAMILY HISTORY:  Family History  Problem Relation Age of Onset  . Hypertension Mother    SOCIAL HISTORY:  reports that he has been smoking Cigarettes.  He has a 5 pack-year smoking history. He does not have any smokeless tobacco history on file. He reports that he drinks alcohol. He reports that he uses illicit drugs (Cocaine).  REVIEW OF SYSTEMS:  Unable to assess  SUBJECTIVE:   VITAL SIGNS: Temp:  [95.4 F (35.2 C)-96.4 F (35.8 C)] 96.4 F (35.8 C) (02/04 0445) Pulse Rate:  [79-120] 91 (02/04 0445) Resp:  [13-35] 27 (02/04 0445) BP: (75-186)/(63-138) 162/114 mmHg (02/04 0445) SpO2:  [96 %-100 %] 100 % (02/04 0445) FiO2 (%):  [40 %-100 %] 40 % (02/04 0426) Weight:  [150 lb 4.8 oz (  68.176 kg)] 150 lb 4.8 oz (68.176 kg) (02/04 0347) HEMODYNAMICS:   VENTILATOR SETTINGS: Vent Mode:  [-] PRVC FiO2 (%):  [40 %-100 %] 40 % Set Rate:  [16 bmp] 16 bmp Vt Set:  [550 mL] 550 mL PEEP:  [5 cmH20] 5 cmH20 Plateau Pressure:  [16 cmH20] 16 cmH20 INTAKE / OUTPUT: Intake/Output    None     PHYSICAL EXAMINATION: General:  Intubated and sedated, calm Neuro:  Vigorus cough/gag, PERRL HEENT:  ETT and OGT in place Neck: No LAD Cardiovascular:  Normal heart rate, sounds Lungs:  CTAB Abdomen:  No distention / obstruction. Musculoskeletal:  No swollen joints Skin:  No rashes.  LABS:  CBC  Recent Labs Lab 02/24/15 0303  02/24/15 0312  WBC 5.5  --   HGB 14.3 16.0  HCT 40.6 47.0  PLT 175  --    Coag's  Recent Labs Lab 02/24/15 0303  APTT 28  INR 0.95   BMET  Recent Labs Lab 02/24/15 0303 02/24/15 0312  NA 141 140  K 4.0 3.9  CL 105 106  CO2 20*  --   BUN 6 11  CREATININE 0.95 1.20  GLUCOSE 92 88   Electrolytes  Recent Labs Lab 02/24/15 0303  CALCIUM 9.4   Sepsis Markers No results for input(s): LATICACIDVEN, PROCALCITON, O2SATVEN in the last 168 hours. ABG  Recent Labs Lab 02/24/15 0422  PHART 7.295*  PCO2ART 47.3*  PO2ART 568.0*   Liver Enzymes  Recent Labs Lab 02/24/15 0303  AST 42*  ALT 30  ALKPHOS 84  BILITOT 0.9  ALBUMIN 4.1   Cardiac Enzymes No results for input(s): TROPONINI, PROBNP in the last 168 hours. Glucose No results for input(s): GLUCAP in the last 168 hours.  Imaging Ct Head Wo Contrast  02/24/2015  CLINICAL DATA:  Code stroke. Left-sided weakness. Left facial droop. EXAM: CT HEAD WITHOUT CONTRAST TECHNIQUE: Contiguous axial images were obtained from the base of the skull through the vertex without intravenous contrast. COMPARISON:  Remote head CT 05/07/2004 FINDINGS: No intracranial hemorrhage, mass effect, or midline shift. No hydrocephalus. The basilar cisterns are patent. No evidence of territorial infarct. No intracranial fluid collection. Calvarium is intact. Included paranasal sinuses and mastoid air cells are well aerated. IMPRESSION: No acute intracranial abnormality on noncontrast head CT. No intracranial hemorrhage. These results were called by telephone at the time of interpretation on 02/24/2015 at 3:15 am to Dr. Roseanne Reno , who verbally acknowledged these results. Electronically Signed   By: Rubye Oaks M.D.   On: 02/24/2015 03:17   Dg Chest Portable 1 View  02/24/2015  CLINICAL DATA:  Intubation. EXAM: PORTABLE CHEST 1 VIEW COMPARISON:  01/22/2015 FINDINGS: Endotracheal tube with tip at the clavicular heads. An orogastric tube reaches the  stomach at least. Cardiomegaly with normal aortic and hilar contours. Clear lungs. No effusion or pneumothorax. IMPRESSION: Endotracheal and orogastric tubes are in good position. Cardiomegaly. Electronically Signed   By: Marnee Spring M.D.   On: 02/24/2015 04:03    EKG: LVH w/ nonspecific ST changes CXR: appears normal, ett in place  ASSESSMENT / PLAN:  Active Problems:   Delirium   Altered mental status   PULMONARY A: Need for mechanical ventilation P:   Maintain on PRVC SBT in AM  CARDIOVASCULAR A: NICM P:   Echo Home meds  RENAL A: No active problems P:    GASTROINTESTINAL A: Need for nutrition P:   Will attempt extubation today and hold TF  HEMATOLOGIC A: No active issues  P:    INFECTIOUS A: No active Issues P:    ENDOCRINE A: No active issues P:    NEUROLOGIC A: AMS Polystubstance use P:   F/u MRI F/u neuro recs PRN ativan for CIWA >8 F/u UDS  BEST PRACTICE / DISPOSITION Level of Care:  ICU Primary Service:  PCCM Consultants:  Neurology Code Status:  Full Diet:  NPO DVT Px:  Heparin GI Px:  None Skin Integrity:  Intact Social / Family:  Updated on admission  TODAY'S SUMMARY: 50 y/o man with polysubstance abuse p/w event of unclear etiology and agitation in the ED requiring intbutation.  I have personally obtained a history, examined the patient, evaluated laboratory and imaging results, formulated the assessment and plan and placed orders.  CRITICAL CARE: The patient is critically ill with multiple organ systems failure and requires high complexity decision making for assessment and support, frequent evaluation and titration of therapies, application of advanced monitoring technologies and extensive interpretation of multiple databases. Critical Care Time devoted to patient care services described in this note is 45 minutes.   Jamie Kato, MD Pulmonary and Critical Care Medicine [facility] Pager: [phone]   02/24/2015, 5:13 AM

## 2015-02-25 ENCOUNTER — Inpatient Hospital Stay (HOSPITAL_COMMUNITY): Payer: Medicaid Other

## 2015-02-25 DIAGNOSIS — I10 Essential (primary) hypertension: Secondary | ICD-10-CM

## 2015-02-25 DIAGNOSIS — I6300 Cerebral infarction due to thrombosis of unspecified precerebral artery: Secondary | ICD-10-CM

## 2015-02-25 DIAGNOSIS — R410 Disorientation, unspecified: Secondary | ICD-10-CM

## 2015-02-25 DIAGNOSIS — E876 Hypokalemia: Secondary | ICD-10-CM | POA: Insufficient documentation

## 2015-02-25 DIAGNOSIS — R4182 Altered mental status, unspecified: Secondary | ICD-10-CM

## 2015-02-25 LAB — CBC
HEMATOCRIT: 38.5 % — AB (ref 39.0–52.0)
HEMOGLOBIN: 13.1 g/dL (ref 13.0–17.0)
MCH: 34 pg (ref 26.0–34.0)
MCHC: 34 g/dL (ref 30.0–36.0)
MCV: 100 fL (ref 78.0–100.0)
Platelets: 136 10*3/uL — ABNORMAL LOW (ref 150–400)
RBC: 3.85 MIL/uL — AB (ref 4.22–5.81)
RDW: 13.2 % (ref 11.5–15.5)
WBC: 6.8 10*3/uL (ref 4.0–10.5)

## 2015-02-25 LAB — LIPID PANEL
CHOL/HDL RATIO: 1.6 ratio
Cholesterol: 131 mg/dL (ref 0–200)
HDL: 83 mg/dL (ref >40)
LDL Cholesterol: 21 mg/dL (ref 0–99)
TRIGLYCERIDES: 134 mg/dL (ref <150)
VLDL: 27 mg/dL (ref 0–40)

## 2015-02-25 LAB — BASIC METABOLIC PANEL
Anion gap: 8 (ref 5–15)
BUN: 6 mg/dL (ref 6–20)
CHLORIDE: 110 mmol/L (ref 101–111)
CO2: 24 mmol/L (ref 22–32)
CREATININE: 0.91 mg/dL (ref 0.61–1.24)
Calcium: 8.5 mg/dL — ABNORMAL LOW (ref 8.9–10.3)
GFR calc non Af Amer: >60 mL/min (ref >60)
Glucose, Bld: 85 mg/dL (ref 65–99)
POTASSIUM: 3.1 mmol/L — AB (ref 3.5–5.1)
SODIUM: 142 mmol/L (ref 135–145)

## 2015-02-25 LAB — URINE CULTURE
Culture: NO GROWTH
Special Requests: NORMAL

## 2015-02-25 LAB — MAGNESIUM
MAGNESIUM: 1.4 mg/dL — AB (ref 1.7–2.4)
MAGNESIUM: 2.1 mg/dL (ref 1.7–2.4)

## 2015-02-25 LAB — PHOSPHORUS: PHOSPHORUS: 3.6 mg/dL (ref 2.5–4.6)

## 2015-02-25 MED ORDER — POTASSIUM CHLORIDE 10 MEQ/100ML IV SOLN
10.0000 meq | INTRAVENOUS | Status: AC
  Administered 2015-02-25 (×4): 10 meq via INTRAVENOUS
  Filled 2015-02-25 (×4): qty 100

## 2015-02-25 MED ORDER — POTASSIUM CHLORIDE CRYS ER 10 MEQ PO TBCR
30.0000 meq | EXTENDED_RELEASE_TABLET | ORAL | Status: AC
  Administered 2015-02-25 (×2): 30 meq via ORAL
  Filled 2015-02-25 (×2): qty 1

## 2015-02-25 MED ORDER — VITAMIN B-1 100 MG PO TABS
100.0000 mg | ORAL_TABLET | Freq: Every day | ORAL | Status: DC
  Administered 2015-02-25 – 2015-02-26 (×2): 100 mg via ORAL
  Filled 2015-02-25 (×2): qty 1

## 2015-02-25 MED ORDER — FOLIC ACID 1 MG PO TABS
1.0000 mg | ORAL_TABLET | Freq: Every day | ORAL | Status: DC
  Administered 2015-02-25 – 2015-02-26 (×2): 1 mg via ORAL
  Filled 2015-02-25 (×2): qty 1

## 2015-02-25 MED ORDER — SODIUM CHLORIDE 0.9 % IV SOLN
6.0000 g | Freq: Once | INTRAVENOUS | Status: AC
  Administered 2015-02-25: 6 g via INTRAVENOUS
  Filled 2015-02-25: qty 12

## 2015-02-25 MED ORDER — ADULT MULTIVITAMIN W/MINERALS CH
1.0000 | ORAL_TABLET | Freq: Every day | ORAL | Status: DC
  Administered 2015-02-25 – 2015-02-26 (×2): 1 via ORAL
  Filled 2015-02-25 (×2): qty 1

## 2015-02-25 NOTE — Evaluation (Signed)
Physical Therapy Evaluation Patient Details Name: [name] MRN: [id number] DOB: November 19, 1965 Today's Date: 02/25/2015   History of Present Illness    50 y.o. male history of hypertension and heart failure brought to the emergency room and code stroke status for acute onset of slurred speech, altered mental status and reduced movements of left extremities. Patient was last known well at 2:00 AM today.  Initially appeared to have left-sided weakness which subsequently resolved, as did his gaze to the right side. CT scan of his head showed no acute intracranial abnormality. Patient was intubated and placed on mechanical ventilation, as well as on propofol for sedation.MRI showed Small acute cortically based infarct in the lateral right peri-Rolandic cortex, right MCA territory. No associated hemorrhage or mass effect.   Clinical Impression  Pt presents with moderate limitations to functional mobility related to decr motor control, weakness, decr balance ability, currently requiring up to minimal physical assist for simple mobility, and has not yet attempted to walk a significant distance.  Recommend CIR screen to determine appropriateness for inpatient rehab, order placed.  Will initiate PT in acute setting and update d/c recommendations as needed with patient progress.  Finance supportive, need to determine whether and to what extent she can participate in recovery care.      Follow Up Recommendations CIR    Equipment Recommendations  Rolling walker with 5" wheels    Recommendations for Other Services Rehab consult     Precautions / Restrictions Precautions Precautions: Fall Precaution Comments: up with assist      Mobility  Bed Mobility Overal bed mobility: Needs Assistance Bed Mobility: Supine to Sit     Supine to sit: Min assist;HOB elevated        Transfers Overall transfer level: Needs assistance Equipment used: 1 person hand held assist Transfers: Sit to/from  Stand Sit to Stand: Min assist            Ambulation/Gait Ambulation/Gait assistance:  (to be assessed further next session)              Stairs            Wheelchair Mobility    Modified Rankin (Stroke Patients Only) Modified Rankin (Stroke Patients Only) Pre-Morbid Rankin Score: No symptoms Modified Rankin: Moderately severe disability     Balance Overall balance assessment: Needs assistance Sitting-balance support: Feet supported;Single extremity supported Sitting balance-Leahy Scale: Fair     Standing balance support: During functional activity;Bilateral upper extremity supported Standing balance-Leahy Scale: Fair                               Pertinent Vitals/Pain Pain Assessment: No/denies pain    Home Living Family/patient expects to be discharged to:: Private residence Living Arrangements: Alone;Spouse/significant other (depending on whose home he goes to ) Available Help at Discharge: Family;Available PRN/intermittently (finance) Type of Home: House Home Access: Stairs to enter   [employer] of Steps: 3 (patients home; fiance has level entry) Home Layout: One level (finace has 2 stories with upstairs bedrooms) Home Equipment: None      Prior Function Level of Independence: Independent               Hand Dominance        Extremity/Trunk Assessment   Upper Extremity Assessment: Defer to OT evaluation           Lower Extremity Assessment: LLE deficits/detail   LLE Deficits / Details: generally  weak with mild motor control deficit when advancing limb for transfers/gait but able to maintain self in standing short bouts  Cervical / Trunk Assessment: Normal  Communication   Communication: No difficulties  Cognition Arousal/Alertness: Awake/alert Behavior During Therapy: WFL for tasks assessed/performed Overall Cognitive Status: Within Functional Limits for tasks assessed (brief interaction, needs  reassess next session)                      General Comments      Exercises        Assessment/Plan    PT Assessment Patient needs continued PT services  PT Diagnosis Hemiplegia non-dominant side   PT Problem List Cardiopulmonary status limiting activity;Decreased knowledge of use of DME;Decreased mobility;Decreased strength;Decreased range of motion;Decreased balance  PT Treatment Interventions Patient/family education;Neuromuscular re-education;Balance training;Therapeutic activities;Therapeutic exercise;Functional mobility training;Gait training;Stair training;DME instruction   PT Goals (Current goals can be found in the Care Plan section) Acute Rehab PT Goals PT Goal Formulation: With patient/family Time For Goal Achievement: 03/11/15 Potential to Achieve Goals: Good    Frequency Min 4X/week   Barriers to discharge Decreased caregiver support;Inaccessible home environment (depending on destination) finance's home has 2nd floor bedrooms but pt's home has 1 story and 2 steps to enter    Co-evaluation               End of Session Equipment Utilized During Treatment: Gait belt Activity Tolerance: Patient tolerated treatment well Patient left: in chair;with nursing/sitter in room (to w/c for transfer to floor) Nurse Communication: Mobility status         Time: 4665-9935 PT Time Calculation (min) (ACUTE ONLY): 20 min   Charges:   PT Evaluation $PT Eval Moderate Complexity: 1 Procedure     PT G Codes:        Dennis Bast 02/25/2015, 12:59 PM

## 2015-02-25 NOTE — Progress Notes (Signed)
STROKE TEAM PROGRESS NOTE   HISTORY At the time of admission: DICKIE CLOE is an 50 y.o. male history of hypertension and heart failure brought to the emergency room and code stroke status for acute onset of slurred speech, altered mental status and reduced movements of left extremities. Patient was last known well at 2:00 AM today. His fiance indicated that he had sudden change in mental status and speech was garbled and he was not moving his left side. EMS noticed lack of movement of left side as well as facial droop and slurred speech. He became agitated in route to the emergency room and was given milligrams of Versed. He received an additional 2 mg of Ativan, as well as 5 mg of Haldol. Patient remained markedly agitated. Initially appeared to have left-sided weakness which subsequently resolved, as did his gaze to the right side. CT scan of his head showed no acute intracranial abnormality. Patient was intubated and placed on mechanical ventilation, as well as on propofol for sedation. He has no previous history of stroke nor TIA. There is also no history of his activity. He reportedly had not been drinking alcohol. Blood alcohol and urine drug screen are pending. Treatment intervention with TPA was deferred when patient's focal deficits resolved.   SUBJECTIVE (INTERVAL HISTORY) His significant other is at the bedside.  He was extubated successfully yesterday.  He is no longer on sedation.  He awakens easily and is without complaints.  ECHO tech at bedside  OBJECTIVE Temp:  [95.9 F (35.5 C)-99 F (37.2 C)] 98.4 F (36.9 C) (02/05 0337) Pulse Rate:  [64-95] 64 (02/05 0800) Cardiac Rhythm:  [-] Normal sinus rhythm (02/05 0800) Resp:  [14-25] 14 (02/05 0800) BP: (79-145)/(58-93) 141/89 mmHg (02/05 0800) SpO2:  [96 %-100 %] 99 % (02/05 0800) Weight:  [66.5 kg (146 lb 9.7 oz)] 66.5 kg (146 lb 9.7 oz) (02/05 0400)  CBC:   Recent Labs Lab 02/24/15 0303 02/24/15 0312 02/25/15 0328  WBC  5.5  --  6.8  NEUTROABS 3.5  --   --   HGB 14.3 16.0 13.1  HCT 40.6 47.0 38.5*  MCV 98.5  --  100.0  PLT 175  --  136*    Basic Metabolic Panel:   Recent Labs Lab 02/24/15 0303 02/24/15 0312 02/24/15 1030 02/25/15 0328  NA 141 140  --  142  K 4.0 3.9  --  3.1*  CL 105 106  --  110  CO2 20*  --   --  24  GLUCOSE 92 88  --  85  BUN 6 11  --  6  CREATININE 0.95 1.20  --  0.91  CALCIUM 9.4  --   --  8.5*  MG  --   --  1.8 1.4*  PHOS  --   --  4.0 3.6    Lipid Panel:     Component Value Date/Time   CHOL 131 02/25/2015 0328   TRIG 134 02/25/2015 0328   HDL 83 02/25/2015 0328   CHOLHDL 1.6 02/25/2015 0328   VLDL 27 02/25/2015 0328   LDLCALC 21 02/25/2015 0328   HgbA1c: No results found for: HGBA1C Urine Drug Screen:     Component Value Date/Time   LABOPIA NONE DETECTED 02/24/2015 0418   COCAINSCRNUR POSITIVE* 02/24/2015 0418   LABBENZ POSITIVE* 02/24/2015 0418   AMPHETMU NONE DETECTED 02/24/2015 0418   THCU NONE DETECTED 02/24/2015 0418   LABBARB NONE DETECTED 02/24/2015 0418    IMAGING  Ct Head Wo  Contrast 02/24/2015   No acute intracranial abnormality on noncontrast head CT. No intracranial hemorrhage.   Mr Brain Wo Contrast 02/24/2015   1. Small acute cortically based infarct in the lateral right peri-Rolandic cortex, right MCA territory. No associated hemorrhage or mass effect.  2. Otherwise negative noncontrast MRI appearance of the brain.  3. Intubated.    Dg Chest Portable 1 View 02/24/2015   Endotracheal and orogastric tubes are in good position. Cardiomegaly.   PHYSICAL EXAM HEENT- Normocephalic, no lesions, without obvious abnormality. Normal external eye and conjunctiva.PERRL  Neck supple with no masses, nodes, nodules or enlargement. Cardiovascular - regular rate and rhythm, S1, S2 normal, no murmur, click, rub or gallop Lungs - chest clear, no wheezing, rales, normal symmetric air entry Abdomen - soft, non-tender; bowel sounds normal; no  masses, no organomegaly Extremities - no joint deformities, effusion, or inflammation and no edema  Neurologic Examination: Patient awakens easily and follows commands and answers orientation questions  CRANIAL NERVES Pupils were equal and reacted normally to light. Extraocular movements were intact with right and left lateral eye movements. Face symmetry difficult to assess Corneals intact Positive gag  MOTOR  Motor exam 5/5 throughout right side; 4+/5 right UE; 3+/5 right lower extremity  SENSORY:  Light touch intact throughout except less in the left lower extremity  GAIT/COORDINATION:  Deferred at this time  ASSESSMENT/PLAN Mr. ORVIE GOLDENSTEIN is a 50 y.o. male with history of hypertension, tobacco use, alcohol use, headaches, and congestive heart failure presenting with transitory left hemiparesis, slurred speech, altered mental status, right gaze preference, and agitation. He did not receive IV t-PA due to resolution of left hemiparesis.  Stroke:  Non-dominant right middle cerebral artery territory infarct possibly embolic from unknown source.  Resultant  Agitation; exam limited by sedation; when sedation weaned will be better able to assess for focality  MRI  Small acute cortically based infarct in the lateral right peri-Rolandic cortex, right MCA territory.  MRA - not performed  Carotid Doppler - Preliminary report: Bilateral: 1-39% ICA stenosis. Vertebral artery flow is antegrade.   2D Echo- pending  LDL - 21  HgbA1c - pending  VTE prophylaxis - Lovenox Diet Heart Room service appropriate?: Yes; Fluid consistency:: Thin  aspirin 325 mg daily prior to admission, now on aspirin 325 mg daily  Patient counseled to be compliant with his antithrombotic medications  Ongoing aggressive stroke risk factor management  Therapy recommendations: Pending  Disposition: Pending  Hypertension  Low at times  Permissive hypertension (OK if < 220/120) but gradually  normalize in 5-7 days   Hyperlipidemia  Home meds: No lipid lowering medications prior to admission.  LDL - 21, goal < 70  Other Stroke Risk Factors  Cigarette smoker, advised to stop smoking  ETOH use  Coronary artery disease  Cocaine abuse; primary team has discussed the risks related to cocaine  Other Active Problems  UDS positive for cocaine  Hospital day # 1  Delton See PA-C [facility] Pager [phone] 02/25/2015, 8:35 AM  CRITICAL CARE NEUROLOGY ATTENDING NOTE Patient was seen and examined by me personally. I reviewed notes, independently viewed imaging studies, participated in medical decision making and plan of care. I have made additions or clarifications directly to the above note.  Documentation accurately reflects findings. The laboratory and radiographic studies were personally reviewed by me.  ROS pertinent positives fully documented; no complaints  Assessment and plan completed by me personally and fully documented above.  Hypokalemia. Will replete  Monitor platelet count given drop noted today  Condition is stabilizing  This patient is improving.  Pulmonary/critical care following. At significant risk of withdrawal from ETOH with neurological worsening.  Requires constant monitoring of vital signs, hemodynamics,respiratory and cardiac monitoring, extensive review of multiple databases, frequent neurological assessment, discussion with family, other specialists and medical decision making of high complexity.  This critical care time does not reflect procedure time, or teaching time or supervisory time of PA/NP/Med Resident etc. but could involve care discussion time.  I spent 30 minutes of Neurocritical Care time in the care of  this patient.  SIGNED BY: Dr. Sula Soda     To contact Stroke Continuity provider, please refer to [email or web address]. After hours, contact General Neurology

## 2015-02-25 NOTE — Progress Notes (Signed)
  Echocardiogram 2D Echocardiogram has been performed.  [name] R 02/25/2015, 10:20 AM

## 2015-02-25 NOTE — Progress Notes (Signed)
PULMONARY / CRITICAL CARE MEDICINE HISTORY AND PHYSICAL EXAMINATION   Name: [name] MRN: [id number] DOB: [birth year]    ADMISSION DATE:  02/24/2015  PRIMARY SERVICE: PCCM  CHIEF COMPLAINT:  AMS  BRIEF PATIENT DESCRIPTION: 43 yh/o man with hx of polysubstance abuse and NICM p/w sudden onset AMS and combative behavior requiring intubation.  SIGNIFICANT EVENTS / STUDIES:  MRI 2/4 small acute infarct R MCA  Tox screen + Cocaine  Extubated 2/4   LINES / TUBES: OETT 7.5 2/4 >>2/4 PIV OGT 2/4 2/4>> Foley 16 fr 2/4>>  CULTURES: Urine, 2/4 >>  ANTIBIOTICS: None  HISTORY OF PRESENT ILLNESS:   [name] is a 50 y/o man with a PMHx of NICM, polysubstance abuse (EtOH and cocaine) who presented to the ED as a code stroke when he developed sudden AMS. Per his significant other, her and him had been doing well without problems and had dinner with and old family friend. They finished up around 1:00 AM, and she saw him well at around 2:00 AM. He requested food (an usual request) and when she returned, she found him unresponsive with some convulsive-like movements. He had confused and slurred speech, so EMS was called and a code stroke begun. A Head CT did not show any acute event. He remained agitated, so PCCM was called for admission. tPA was not given due to lack of clinical exam with persistent defects.  SUBJECTIVE: Extubated 2/4 , no increased wob , good O2 sats  MRI showed small infarct R MCA , tox screen positive for cocaine.  Moving left side now, mild weakness in hand , ? Speech residuals   VITAL SIGNS: Temp:  [95.7 F (35.4 C)-99 F (37.2 C)] 98.4 F (36.9 C) (02/05 0337) Pulse Rate:  [65-95] 65 (02/05 0700) Resp:  [14-25] 19 (02/05 0700) BP: (79-145)/(58-93) 139/71 mmHg (02/05 0700) SpO2:  [96 %-100 %] 99 % (02/05 0700) FiO2 (%):  [40 %] 40 % (02/04 0800) Weight:  [146 lb 9.7 oz (66.5 kg)] 146 lb 9.7 oz (66.5 kg) (02/05 0400) HEMODYNAMICS:   VENTILATOR  SETTINGS: Vent Mode:  [-]  FiO2 (%):  [40 %] 40 % INTAKE / OUTPUT: Intake/Output      02/04 0701 - 02/05 0700 02/05 0701 - 02/06 0700   P.O. 960    I.V. (mL/kg) 1670.6 (25.1)    Total Intake(mL/kg) 2630.6 (39.6)    Urine (mL/kg/hr) 1952 (1.2)    Stool 0 (0)    Total Output 1952     Net +678.6          Stool Occurrence 1 x     PHYSICAL EXAMINATION: General:  NAD, follows commands  Neuro:  A/o x 3 , PERRL, MAEW x 4 , mild left hand weakness, ?garbled speech  HEENT: dry oral mucosa  Neck: No LAD Cardiovascular:  Normal heart rate, sounds Lungs:  CTAB Abdomen:  No distention / obstruction. Musculoskeletal:  No swollen joints Skin:  No rashes.  LABS:  CBC  Recent Labs Lab 02/24/15 0303 02/24/15 0312 02/25/15 0328  WBC 5.5  --  6.8  HGB 14.3 16.0 13.1  HCT 40.6 47.0 38.5*  PLT 175  --  136*   Coag's  Recent Labs Lab 02/24/15 0303  APTT 28  INR 0.95   BMET  Recent Labs Lab 02/24/15 0303 02/24/15 0312 02/25/15 0328  NA 141 140 142  K 4.0 3.9 3.1*  CL 105 106 110  CO2 20*  --  24  BUN 6 11 6  CREATININE 0.95 1.20 0.91  GLUCOSE 92 88 85   Electrolytes  Recent Labs Lab 02/24/15 0303 02/24/15 1030 02/25/15 0328  CALCIUM 9.4  --  8.5*  MG  --  1.8 1.4*  PHOS  --  4.0 3.6   Sepsis Markers No results for input(s): LATICACIDVEN, PROCALCITON, O2SATVEN in the last 168 hours. ABG  Recent Labs Lab 02/24/15 0422  PHART 7.295*  PCO2ART 47.3*  PO2ART 568.0*   Liver Enzymes  Recent Labs Lab 02/24/15 0303  AST 42*  ALT 30  ALKPHOS 84  BILITOT 0.9  ALBUMIN 4.1   Cardiac Enzymes No results for input(s): TROPONINI, PROBNP in the last 168 hours. Glucose No results for input(s): GLUCAP in the last 168 hours.  Imaging Ct Head Wo Contrast  02/24/2015  CLINICAL DATA:  Code stroke. Left-sided weakness. Left facial droop. EXAM: CT HEAD WITHOUT CONTRAST TECHNIQUE: Contiguous axial images were obtained from the base of the skull through the vertex  without intravenous contrast. COMPARISON:  Remote head CT 05/07/2004 FINDINGS: No intracranial hemorrhage, mass effect, or midline shift. No hydrocephalus. The basilar cisterns are patent. No evidence of territorial infarct. No intracranial fluid collection. Calvarium is intact. Included paranasal sinuses and mastoid air cells are well aerated. IMPRESSION: No acute intracranial abnormality on noncontrast head CT. No intracranial hemorrhage. These results were called by telephone at the time of interpretation on 02/24/2015 at 3:15 am to Dr. Roseanne Reno , who verbally acknowledged these results. Electronically Signed   By: Rubye Oaks M.D.   On: 02/24/2015 03:17   Mr Brain Wo Contrast  02/24/2015  CLINICAL DATA:  50 year old male code stroke with altered mental status and decreased left extremity movement. Initial encounter. EXAM: MRI HEAD WITHOUT CONTRAST TECHNIQUE: Multiplanar, multiecho pulse sequences of the brain and surrounding structures were obtained without intravenous contrast. COMPARISON:  Head CT without contrast hours today. Brain MRI 05/08/2004. FINDINGS: Major intracranial vascular flow voids are stable. There is a small cortically based area of restricted diffusion in the lateral right peri-Rolandic cortex encompassing about 15 mm (series 4, image 31). No associated hemorrhage or mass effect. No other restricted diffusion. Other gray and white matter signal is within normal limits ; asymmetric sulcus FLAIR signal at the right operculum on is not correlated with signal abnormality on any other sequence. No chronic cerebral blood products. The patient is intubated. Fluid in the pharynx. No midline shift, mass effect, evidence of mass lesion, ventriculomegaly, extra-axial collection or acute intracranial hemorrhage. Cervicomedullary junction and pituitary are within normal limits. Negative visualized cervical spine. Normal bone marrow signal. Mild mastoid effusions. Mild to moderate paranasal sinus  mucosal thickening. Left superior convexity scalp soft tissue scarring (series 9, image 76) is chronic. IMPRESSION: 1. Small acute cortically based infarct in the lateral right peri-Rolandic cortex, right MCA territory. No associated hemorrhage or mass effect. 2. Otherwise negative noncontrast MRI appearance of the brain. 3. Intubated. Electronically Signed   By: Odessa Fleming M.D.   On: 02/24/2015 06:41   Dg Chest Port 1 View  02/25/2015  CLINICAL DATA:  Respiratory failure. EXAM: PORTABLE CHEST 1 VIEW COMPARISON:  February 24, 2015. FINDINGS: Stable cardiomegaly. Endotracheal and nasogastric tubes noted on prior exam have been removed. No pneumothorax or pleural effusion is noted. No acute pulmonary disease is noted. Bony thorax is intact. IMPRESSION: No acute cardiopulmonary abnormality seen. Endotracheal and nasogastric tubes have been removed. Electronically Signed   By: Lupita Raider, M.D.   On: 02/25/2015 07:28   Dg Chest Portable  1 View  02/24/2015  CLINICAL DATA:  Intubation. EXAM: PORTABLE CHEST 1 VIEW COMPARISON:  01/22/2015 FINDINGS: Endotracheal tube with tip at the clavicular heads. An orogastric tube reaches the stomach at least. Cardiomegaly with normal aortic and hilar contours. Clear lungs. No effusion or pneumothorax. IMPRESSION: Endotracheal and orogastric tubes are in good position. Cardiomegaly. Electronically Signed   By: Marnee Spring M.D.   On: 02/24/2015 04:03    EKG: LVH w/ nonspecific ST changes CXR: appears normal, ett in place  ASSESSMENT / PLAN:  Active Problems:   Delirium   Altered mental status   Cerebrovascular accident (CVA) (HCC)   PULMONARY A: Extubated 2/4 , tolerated well. CXR clear today  P:    O2 to keep sats >90%  CARDIOVASCULAR A: NICM P:   Echo pending. Home meds.  RENAL A: Hypokalemia  Hypomag  P:   BMET in AM. Replace electrolytes as indicated.  GASTROINTESTINAL A:  P:   Heart healthy diet   HEMATOLOGIC A: No active issues P:    CBC in AM.  INFECTIOUS A: No active Issues P:   Monitor  ENDOCRINE A: No active issues P:   Monitor  NEUROLOGIC A: AMS Polystubstance use-tox screen + cocaine  R. MCA infarct on MRI  P:   F/u neuro recs. PRN ativan for CIWA >8 Thiamine/folate/MVI started.  TODAY'S SUMMARY: 50 y/o man with polysubstance abuse +cocaine  presents with Right MCA infarct . Pt is doing well post extubation. Only mild residual deficits noted on left.  Neuro is following. Ready to transfer to Med surg and TRH .   Tammy Parrett NP-C  [facility]  [phone]   02/25/2015, 7:48 AM   Attending Note:  50 year old male, polysubstance abuse presenting with acute intoxication, was combative, suffered a right MCA infarct, intubated by ER for agitation now extubated.  I reviewed CXR myself, no evidence of acute disease.  On exam, lung clear to auscultation and patient is more cooperative.  Discussed with PCCM-NP.  Agitation: due to cocaine and CVA.  - Drug abuse counseling.  - Monitor.  Acute respiratory failure:   - Titrate O2 for sat of 88-92%.  - Ambulate.  - IS per RT protocol.  Hypokalemia:  - Replace.  - BMET in AM.  Alcohol abuse:  - CIWA.  - Ativan.  - Thiamine/folate/MVI.  Transfer out of the ICU and transfer care to [facility] with PCCM off 2/6.  Patient seen and examined, agree with above note.  I dictated the care and orders written for this patient under my direction.  Alyson Reedy, MD [phone]

## 2015-02-25 NOTE — Progress Notes (Signed)
[facility] ADULT ICU REPLACEMENT PROTOCOL FOR AM LAB REPLACEMENT ONLY  The patient does apply for the [facility] Adult ICU Electrolyte Replacment Protocol based on the criteria listed below:   1. Is GFR >/= 40 ml/min? Yes.    Patient's GFR today is >60 2. Is urine output >/= 0.5 ml/kg/hr for the last 6 hours? Yes.   Patient's UOP is 0.5 ml/kg/hr 3. Is BUN < 60 mg/dL? Yes.    Patient's BUN today is 6 4. Abnormal electrolyte(s): K - 3.1 & Mg - 1.4 5. Ordered repletion PER PROTOCOL 6. If a panic level lab has been reported, has the CCM MD in charge been notified? Yes.  .   Physician:  Dr. Kenyon Ana 02/25/2015 5:08 AM

## 2015-02-26 ENCOUNTER — Inpatient Hospital Stay (HOSPITAL_COMMUNITY)
Admission: AD | Admit: 2015-02-26 | Discharge: 2015-03-06 | DRG: 057 | Disposition: A | Discharge status: Home Health | Payer: Medicaid Other | Source: Intra-hospital | Attending: Physical Medicine & Rehabilitation | Admitting: Physical Medicine & Rehabilitation

## 2015-02-26 DIAGNOSIS — I63511 Cerebral infarction due to unspecified occlusion or stenosis of right middle cerebral artery: Secondary | ICD-10-CM | POA: Diagnosis not present

## 2015-02-26 DIAGNOSIS — D62 Acute posthemorrhagic anemia: Secondary | ICD-10-CM | POA: Diagnosis not present

## 2015-02-26 DIAGNOSIS — I429 Cardiomyopathy, unspecified: Secondary | ICD-10-CM | POA: Diagnosis present

## 2015-02-26 DIAGNOSIS — I639 Cerebral infarction, unspecified: Secondary | ICD-10-CM | POA: Diagnosis not present

## 2015-02-26 DIAGNOSIS — I5042 Chronic combined systolic (congestive) and diastolic (congestive) heart failure: Secondary | ICD-10-CM | POA: Insufficient documentation

## 2015-02-26 DIAGNOSIS — Z8673 Personal history of transient ischemic attack (TIA), and cerebral infarction without residual deficits: Secondary | ICD-10-CM | POA: Diagnosis present

## 2015-02-26 DIAGNOSIS — R471 Dysarthria and anarthria: Secondary | ICD-10-CM | POA: Diagnosis present

## 2015-02-26 DIAGNOSIS — I10 Essential (primary) hypertension: Secondary | ICD-10-CM | POA: Diagnosis present

## 2015-02-26 DIAGNOSIS — I5032 Chronic diastolic (congestive) heart failure: Secondary | ICD-10-CM | POA: Diagnosis present

## 2015-02-26 DIAGNOSIS — I428 Other cardiomyopathies: Secondary | ICD-10-CM | POA: Insufficient documentation

## 2015-02-26 DIAGNOSIS — I69354 Hemiplegia and hemiparesis following cerebral infarction affecting left non-dominant side: Secondary | ICD-10-CM | POA: Diagnosis not present

## 2015-02-26 DIAGNOSIS — I5043 Acute on chronic combined systolic (congestive) and diastolic (congestive) heart failure: Secondary | ICD-10-CM

## 2015-02-26 DIAGNOSIS — Z79899 Other long term (current) drug therapy: Secondary | ICD-10-CM

## 2015-02-26 DIAGNOSIS — Z7982 Long term (current) use of aspirin: Secondary | ICD-10-CM

## 2015-02-26 DIAGNOSIS — F1010 Alcohol abuse, uncomplicated: Secondary | ICD-10-CM | POA: Insufficient documentation

## 2015-02-26 DIAGNOSIS — F1410 Cocaine abuse, uncomplicated: Secondary | ICD-10-CM | POA: Insufficient documentation

## 2015-02-26 DIAGNOSIS — IMO0002 Reserved for concepts with insufficient information to code with codable children: Secondary | ICD-10-CM | POA: Insufficient documentation

## 2015-02-26 DIAGNOSIS — I5021 Acute systolic (congestive) heart failure: Secondary | ICD-10-CM

## 2015-02-26 DIAGNOSIS — F17210 Nicotine dependence, cigarettes, uncomplicated: Secondary | ICD-10-CM | POA: Diagnosis present

## 2015-02-26 DIAGNOSIS — I6350 Cerebral infarction due to unspecified occlusion or stenosis of unspecified cerebral artery: Secondary | ICD-10-CM

## 2015-02-26 DIAGNOSIS — I69351 Hemiplegia and hemiparesis following cerebral infarction affecting right dominant side: Secondary | ICD-10-CM | POA: Diagnosis present

## 2015-02-26 DIAGNOSIS — Z720 Tobacco use: Secondary | ICD-10-CM | POA: Insufficient documentation

## 2015-02-26 LAB — BASIC METABOLIC PANEL
Anion gap: 9 (ref 5–15)
BUN: 9 mg/dL (ref 6–20)
CALCIUM: 8.8 mg/dL — AB (ref 8.9–10.3)
CHLORIDE: 105 mmol/L (ref 101–111)
CO2: 24 mmol/L (ref 22–32)
CREATININE: 0.91 mg/dL (ref 0.61–1.24)
GFR calc Af Amer: >60 mL/min (ref >60)
GFR calc non Af Amer: >60 mL/min (ref >60)
GLUCOSE: 102 mg/dL — AB (ref 65–99)
Potassium: 3.8 mmol/L (ref 3.5–5.1)
Sodium: 138 mmol/L (ref 135–145)

## 2015-02-26 LAB — MAGNESIUM: MAGNESIUM: 1.7 mg/dL (ref 1.7–2.4)

## 2015-02-26 LAB — CBC
HEMATOCRIT: 36.8 % — AB (ref 39.0–52.0)
HEMOGLOBIN: 12.7 g/dL — AB (ref 13.0–17.0)
MCH: 34.3 pg — ABNORMAL HIGH (ref 26.0–34.0)
MCHC: 34.5 g/dL (ref 30.0–36.0)
MCV: 99.5 fL (ref 78.0–100.0)
Platelets: 142 10*3/uL — ABNORMAL LOW (ref 150–400)
RBC: 3.7 MIL/uL — ABNORMAL LOW (ref 4.22–5.81)
RDW: 12.8 % (ref 11.5–15.5)
WBC: 6.2 10*3/uL (ref 4.0–10.5)

## 2015-02-26 LAB — HEMOGLOBIN A1C
HEMOGLOBIN A1C: 5.2 % (ref 4.8–5.6)
Mean Plasma Glucose: 103 mg/dL

## 2015-02-26 MED ORDER — ACETAMINOPHEN 325 MG PO TABS
325.0000 mg | ORAL_TABLET | ORAL | Status: DC | PRN
  Administered 2015-02-27 – 2015-03-05 (×5): 650 mg via ORAL
  Filled 2015-02-26 (×5): qty 2

## 2015-02-26 MED ORDER — MAGNESIUM OXIDE 400 (241.3 MG) MG PO TABS
400.0000 mg | ORAL_TABLET | Freq: Two times a day (BID) | ORAL | Status: DC
Start: 2015-02-26 — End: 2015-03-06
  Administered 2015-02-26 – 2015-03-06 (×16): 400 mg via ORAL
  Filled 2015-02-26 (×16): qty 1

## 2015-02-26 MED ORDER — VITAMIN B-1 100 MG PO TABS
100.0000 mg | ORAL_TABLET | Freq: Every day | ORAL | Status: DC
  Administered 2015-02-27 – 2015-03-06 (×8): 100 mg via ORAL
  Filled 2015-02-26 (×8): qty 1

## 2015-02-26 MED ORDER — ENOXAPARIN SODIUM 40 MG/0.4ML ~~LOC~~ SOLN: 40.0000 mg | SUBCUTANEOUS | Status: DC

## 2015-02-26 MED ORDER — ASPIRIN EC 325 MG PO TBEC
325.0000 mg | DELAYED_RELEASE_TABLET | Freq: Every day | ORAL | Status: DC
  Administered 2015-02-27 – 2015-03-06 (×8): 325 mg via ORAL
  Filled 2015-02-26 (×9): qty 1

## 2015-02-26 MED ORDER — LISINOPRIL 20 MG PO TABS
20.0000 mg | ORAL_TABLET | Freq: Every day | ORAL | Status: DC
  Administered 2015-02-27: 20 mg via ORAL
  Filled 2015-02-26 (×2): qty 1

## 2015-02-26 MED ORDER — SORBITOL 70 % SOLN
30.0000 mL | Freq: Every day | Status: DC | PRN
  Filled 2015-02-26: qty 30

## 2015-02-26 MED ORDER — ONDANSETRON HCL 4 MG PO TABS: 4.0000 mg | ORAL_TABLET | Freq: Four times a day (QID) | ORAL | Status: DC | PRN

## 2015-02-26 MED ORDER — ADULT MULTIVITAMIN W/MINERALS CH
1.0000 | ORAL_TABLET | Freq: Every day | ORAL | Status: DC
  Administered 2015-02-27 – 2015-03-06 (×8): 1 via ORAL
  Filled 2015-02-26 (×8): qty 1

## 2015-02-26 MED ORDER — SPIRONOLACTONE 25 MG PO TABS
12.5000 mg | ORAL_TABLET | Freq: Every day | ORAL | Status: DC
  Administered 2015-02-27 – 2015-03-06 (×8): 12.5 mg via ORAL
  Filled 2015-02-26 (×8): qty 1

## 2015-02-26 MED ORDER — FLUTICASONE PROPIONATE 50 MCG/ACT NA SUSP
1.0000 | Freq: Every day | NASAL | Status: DC
  Administered 2015-02-26: 1 via NASAL
  Filled 2015-02-26: qty 16

## 2015-02-26 MED ORDER — ENOXAPARIN SODIUM 40 MG/0.4ML ~~LOC~~ SOLN
40.0000 mg | Freq: Every day | SUBCUTANEOUS | Status: DC
  Administered 2015-02-27 – 2015-03-05 (×7): 40 mg via SUBCUTANEOUS
  Filled 2015-02-26 (×8): qty 0.4

## 2015-02-26 MED ORDER — MAGNESIUM OXIDE 400 (241.3 MG) MG PO TABS: 400.0000 mg | ORAL_TABLET | Freq: Every day | ORAL | Status: DC

## 2015-02-26 MED ORDER — WHITE PETROLATUM GEL
Status: AC
  Administered 2015-02-26: 19:00:00
  Filled 2015-02-26: qty 1

## 2015-02-26 MED ORDER — FLUTICASONE PROPIONATE 50 MCG/ACT NA SUSP: 1.0000 | Freq: Every day | NASAL | Status: DC

## 2015-02-26 MED ORDER — FOLIC ACID 1 MG PO TABS
1.0000 mg | ORAL_TABLET | Freq: Every day | ORAL | Status: DC
  Administered 2015-02-27 – 2015-03-06 (×8): 1 mg via ORAL
  Filled 2015-02-26 (×8): qty 1

## 2015-02-26 MED ORDER — MAGNESIUM OXIDE 400 (241.3 MG) MG PO TABS
400.0000 mg | ORAL_TABLET | Freq: Two times a day (BID) | ORAL | Status: DC
  Administered 2015-02-26: 400 mg via ORAL
  Filled 2015-02-26: qty 1

## 2015-02-26 MED ORDER — ONDANSETRON HCL 4 MG/2ML IJ SOLN: 4.0000 mg | Freq: Four times a day (QID) | INTRAMUSCULAR | Status: DC | PRN

## 2015-02-26 NOTE — Progress Notes (Signed)
Trish Mage, RN Rehab Admission Coordinator Signed Physical Medicine and Rehabilitation PMR Pre-admission 02/26/2015 3:01 PM  Related encounter: ED to Hosp-Admission (Current) from 02/24/2015 in [facility] CHF    Expand All Collapse All   PMR Admission Coordinator Pre-Admission Assessment  Patient: [name] is an 50 y.o., male MRN: [id number] DOB: 04/07/1965 Height: 5\' 8"  (172.7 cm) Weight: 67.631 kg (149 lb 1.6 oz) (scale c)  Insurance Information Self pay - no insurance  Medicaid Application Date: Case Manager:  Disability Application Date: Case Worker:   Emergency Conservator, museum/gallery Information    Name Relation Home Work Mobile   Dargan,Kristina Significant other   [phone]   [place] [phone]       Current Medical History  Patient Admitting Diagnosis: R MCA infarct  History of Present Illness: A 50 y.o. right handed male with history of hypertension, chronic headaches,NICM, diastolic congestive heart failure, tobacco abuse and polysubstance abuse. Patient lives alone independently prior to admission. He has a fiance that can assist on discharge. Presented 02/24/2015 with altered mental status as well as slurred speech and left-sided weakness. MRI of the brain showed small acute cortical-based infarct lateral right MCA territory without hemorrhage. Alcohol level 214. Positive cocaine. Echocardiogram with ejection fraction of 20% severe diffuse hypokinesis with grade 1 diastolic dysfunction. Carotid Dopplers with no ICA stenosis. EEG consistent with encephalopathy no seizure activity. Patient did not receive TPA. Neurology consulted maintained on aspirin for CVA prophylaxis. Subcutaneous Lovenox for DVT prophylaxis. Tolerating a regular consistency  diet. Physical therapy evaluation completed 02/25/2015 with recommendations of physical medicine rehabilitation consult.   Total: 0=NIH  Past Medical History  Past Medical History  Diagnosis Date  . Hypertension   . Headache   . CHF (congestive heart failure) (HCC)     Family History  family history includes Hypertension in his mother.  Prior Rehab/Hospitalizations: No previous rehab.  Has the patient had major surgery during 100 days prior to admission? No  Current Medications   Current facility-administered medications:  . 0.9 % sodium chloride infusion, 250 mL, Intravenous, PRN, Jamie Kato, MD, Last Rate: 20 mL/hr at 02/24/15 2000, 250 mL at 02/24/15 2000 . aspirin EC tablet 325 mg, 325 mg, Oral, Daily, Jamie Kato, MD, 325 mg at 02/26/15 0943 . enoxaparin (LOVENOX) injection 40 mg, 40 mg, Subcutaneous, Daily, Jamie Kato, MD, 40 mg at 02/26/15 0944 . fluticasone (FLONASE) 50 MCG/ACT nasal spray 1 spray, 1 spray, Each Nare, Daily, Ripudeep K Rai, MD . folic acid (FOLVITE) tablet 1 mg, 1 mg, Oral, Daily, Alyson Reedy, MD, 1 mg at 02/26/15 0943 . lisinopril (PRINIVIL,ZESTRIL) tablet 20 mg, 20 mg, Oral, Daily, Jamie Kato, MD, 20 mg at 02/26/15 0943 . LORazepam (ATIVAN) injection 2 mg, 2 mg, Intravenous, Q4H PRN, Jamie Kato, MD . magnesium oxide (MAG-OX) tablet 400 mg, 400 mg, Oral, BID, Ripudeep K Rai, MD, 400 mg at 02/26/15 0943 . multivitamin with minerals tablet 1 tablet, 1 tablet, Oral, Daily, Alyson Reedy, MD, 1 tablet at 02/26/15 [phone] . spironolactone (ALDACTONE) tablet 12.5 mg, 12.5 mg, Oral, Daily, Jamie Kato, MD, 12.5 mg at 02/26/15 0943 . thiamine (VITAMIN B-1) tablet 100 mg, 100 mg, Oral, Daily, Alyson Reedy, MD, 100 mg at 02/26/15 9678  Patients Current Diet: Diet Heart Room service appropriate?: Yes; Fluid consistency:: Thin  Precautions / Restrictions Precautions Precautions: Fall Precaution Comments: up with  assist Restrictions Weight Bearing Restrictions: No   Has the patient had 2 or more  falls or a fall with injury in the past year?Yes. Reports 4 or more falls with no injury.  Prior Activity Level Community (5-7x/wk): Went out daily. Worked in the "moving business".  Home Assistive Devices / Equipment Home Assistive Devices/Equipment: None Home Equipment: None  Prior Device Use: Indicate devices/aids used by the patient prior to current illness, exacerbation or injury? Cane, but reports rarely using the cane.  Prior Functional Level Prior Function Level of Independence: Independent  Self Care: Did the patient need help bathing, dressing, using the toilet or eating? Independent  Indoor Mobility: Did the patient need assistance with walking from room to room (with or without device)? Independent  Stairs: Did the patient need assistance with internal or external stairs (with or without device)? Independent  Functional Cognition: Did the patient need help planning regular tasks such as shopping or remembering to take medications? Independent  Current Functional Level Cognition  Overall Cognitive Status: Impaired/Different from baseline Orientation Level: Oriented X4   Extremity Assessment (includes Sensation/Coordination)  Upper Extremity Assessment: Defer to OT evaluation  Lower Extremity Assessment: LLE deficits/detail LLE Deficits / Details: generally weak with mild motor control deficit when advancing limb for transfers/gait but able to maintain self in standing short bouts    ADLs       Mobility  Overal bed mobility: Needs Assistance Bed Mobility: Supine to Sit, Sit to Supine Supine to sit: Min assist, HOB elevated Sit to supine: Min assist General bed mobility comments: Min assist for movement and positioning of left side. Cues for self assist techniques.    Transfers  Overall transfer level: Needs assistance Equipment used: Rolling walker (2  wheeled) Transfers: Sit to/from Stand Sit to Stand: Min assist General transfer comment: VCs for hand placement,.min assist for stability. Cues for positioning and weight bearing through left side    Ambulation / Gait / Stairs / Wheelchair Mobility  Ambulation/Gait Ambulation/Gait assistance: Min assist Ambulation Distance (Feet): 60 Feet (x2) Assistive device: Rolling walker (2 wheeled) (poor ability to attempt gait without assistive device) Gait Pattern/deviations: Step-through pattern, Decreased step length - left, Decreased stance time - left, Decreased stride length, Decreased dorsiflexion - left, Steppage General Gait Details: Focus on giat retraining. decreased sensory LLE. Patient with exageratted knee flexion and poor placement and stride. VCs for heel strike and decreased knee flexion when advancing through loading response phase. Patient cued for upright posture and focus to task. Heavy reliance on RW for support during gait retraining.     Posture / Balance Balance Overall balance assessment: Needs assistance Sitting-balance support: Feet supported Sitting balance-Leahy Scale: Fair Standing balance support: During functional activity Standing balance-Leahy Scale: Fair    Special needs/care consideration BiPAP/CPAP No CPM No Continuous Drip IV KVO in both arms Dialysis No  Life Vest No Oxygen No Special Bed No Trach Size No Wound Vac (area) No  Skin No  Bowel mgmt: Last BM 02/25/15 Bladder mgmt: Voiding in urinal WDL Diabetic mgmt No    Previous Home Environment Living Arrangements: Spouse/significant other Available Help at Discharge: Family, Available PRN/intermittently (finance) Type of Home: House Home Layout: One level (finace has 2 stories with upstairs bedrooms) Home Access: Stairs to enter [employer] of Steps: 3 (patients home; fiance has level entry) Home Care Services: No  Discharge Living  Setting Plans for Discharge Living Setting: Alone, House (Lives alone.) Type of Home at Discharge: House Discharge Home Layout: One level Discharge Home Access: Stairs to enter Entrance Stairs-Number of Steps: 2 Does the patient have  any problems obtaining your medications?: No  Social/Family/Support Systems Patient Roles: Parent, Other (Comment) (Has a fiance and a son.) Contact Information: Daneen Schick - fiance [phone] Anticipated Caregiver: self and fiance Ability/Limitations of Caregiver: Foye Clock is currently not working, but may be working in the next 1-2 weeks Caregiver Availability: Intermittent Discharge Plan Discussed with Primary Caregiver: Yes Is Caregiver In Agreement with Plan?: Yes Does Caregiver/Family have Issues with Lodging/Transportation while Pt is in Rehab?: No  Goals/Additional Needs Patient/Family Goal for Rehab: PT/OT mod I, ST mod I and I goals Expected length of stay: 7-10 days Cultural Considerations: None Dietary Needs: Heart diet, thin liquids Equipment Needs: TBD Pt/Family Agrees to Admission and willing to participate: Yes Program Orientation Provided & Reviewed with Pt/Caregiver Including Roles & Responsibilities: Yes  Decrease burden of Care through IP rehab admission: N/A  Possible need for SNF placement upon discharge: Not anticipated  Patient Condition: This patient's condition remains as documented in the consult dated 02/26/15, in which the Rehabilitation Physician determined and documented that the patient's condition is appropriate for intensive rehabilitative care in an inpatient rehabilitation facility. Will admit to inpatient rehab today.  Preadmission Screen Completed By: Trish Mage, 02/26/2015 3:09 PM ______________________________________________________________________  Discussed status with Dr. [name] on 02/26/15 at 1508 and received telephone approval for admission today.  Admission Coordinator: Trish Mage,  time1508/Date02/06/17          Cosigned by: Ankit Karis Juba, MD at 02/26/2015 3:13 PM  Revision History     Date/Time User Provider Type Action   02/26/2015 3:13 PM Ankit Karis Juba, MD Physician Cosign   02/26/2015 3:09 PM Trish Mage, RN Rehab Admission Coordinator Sign

## 2015-02-26 NOTE — Consult Note (Signed)
Physical Medicine and Rehabilitation Consult Reason for Consult: Right MCA territory infarct Referring Physician: [facility]  HPI: [name] is a 50 y.o. right handed male with history of hypertension, chronic headaches,NICM, diastolic congestive heart failure, tobacco abuse and polysubstance abuse. Patient lives alone independently prior to admission. He has a fiance that can assist on discharge. Presented 02/24/2015 with altered mental status as well as slurred speech and left-sided weakness. MRI of the brain showed small acute cortical-based infarct lateral right MCA territory without hemorrhage. Alcohol level 214. Positive cocaine. Echocardiogram with ejection fraction of 20% severe diffuse hypokinesis with grade 1 diastolic dysfunction. Carotid Dopplers with no ICA stenosis. EEG consistent with encephalopathy no seizure activity. Patient did not receive TPA. Neurology consulted maintained on aspirin for CVA prophylaxis. Subcutaneous Lovenox for DVT prophylaxis. Tolerating a regular consistency diet. Physical therapy evaluation completed 02/25/2015 with recommendations of physical medicine rehabilitation consult.  Review of Systems  Constitutional: Negative for fever and chills.       Left side weakness  HENT: Negative for hearing loss.   Eyes: Negative for blurred vision and double vision.  Respiratory: Negative for cough and shortness of breath.   Cardiovascular: Positive for leg swelling. Negative for chest pain and palpitations.  Gastrointestinal: Negative for nausea, vomiting and abdominal pain.  Genitourinary: Negative for dysuria and hematuria.  Musculoskeletal: Positive for myalgias.  Skin: Negative for rash.  Neurological: Positive for headaches. Negative for seizures and loss of consciousness.  All other systems reviewed and are negative.  Past Medical History  Diagnosis Date  . Hypertension   . Headache   . CHF (congestive heart failure) [facility])    Past Surgical  History  Procedure Laterality Date  . No past surgeries     Family History  Problem Relation Age of Onset  . Hypertension Mother    Social History:  reports that he has been smoking Cigarettes.  He has a 5 pack-year smoking history. He does not have any smokeless tobacco history on file. He reports that he drinks alcohol. He reports that he uses illicit drugs (Cocaine). Allergies:  Allergies  Allergen Reactions  . Penicillins Other (See Comments)    Childhood    Facility-administered medications prior to admission  Medication Dose Route Frequency Provider Last Rate Last Dose  . cloNIDine (CATAPRES) tablet 0.1 mg  0.1 mg Oral Once Gaylord Shih, MD       Medications Prior to Admission  Medication Sig Dispense Refill  . aspirin 325 MG EC tablet Take 1 tablet (325 mg total) by mouth daily. 30 tablet 11  . furosemide (LASIX) 40 MG tablet Take 2 tablets (80 mg total) by mouth daily. 60 tablet 11  . ibuprofen (ADVIL,MOTRIN) 600 MG tablet Take 1 tablet (600 mg total) by mouth every 6 (six) hours as needed for moderate pain. 30 tablet 0  . lisinopril (PRINIVIL,ZESTRIL) 20 MG tablet Take 1 tablet (20 mg total) by mouth daily. 30 tablet 11  . methocarbamol (ROBAXIN) 500 MG tablet Take 2 tablets (1,000 mg total) by mouth every 8 (eight) hours as needed for muscle spasms. 30 tablet 0  . spironolactone (ALDACTONE) 25 MG tablet Take 0.5 tablets (12.5 mg total) by mouth daily. 60 tablet 11    Home: Home Living Family/patient expects to be discharged to:: Private residence Living Arrangements: Alone, Spouse/significant other (depending on whose home he goes to ) Available Help at Discharge: Family, Available PRN/intermittently (finance) Type of Home: House Home Access: Stairs to enter  Entrance Stairs-Number of Steps: 3 (patients home; fiance has level entry) Home Layout: One level (finace has 2 stories with upstairs bedrooms) Home Equipment: None  Functional History: Prior Function Level of  Independence: Independent Functional Status:  Mobility: Bed Mobility Overal bed mobility: Needs Assistance Bed Mobility: Supine to Sit Supine to sit: Min assist, HOB elevated Transfers Overall transfer level: Needs assistance Equipment used: 1 person hand held assist Transfers: Sit to/from Stand Sit to Stand: Min assist Ambulation/Gait Ambulation/Gait assistance:  (to be assessed further next session)    ADL:    Cognition: Cognition Overall Cognitive Status: Within Functional Limits for tasks assessed (brief interaction, needs reassess next session) Orientation Level: Oriented X4 Cognition Arousal/Alertness: Awake/alert Behavior During Therapy: WFL for tasks assessed/performed Overall Cognitive Status: Within Functional Limits for tasks assessed (brief interaction, needs reassess next session)  Blood pressure 152/89, pulse 72, temperature 98.2 F (36.8 C), temperature source Oral, resp. rate 17, height 5\' 8"  (1.727 m), weight 67.631 kg (149 lb 1.6 oz), SpO2 100 %. Physical Exam  Vitals reviewed. Constitutional: He is oriented to person, place, and time. He appears well-developed and well-nourished.  HENT:  Head: Normocephalic and atraumatic.  Eyes: EOM are normal. Right eye exhibits no discharge. Left eye exhibits no discharge.  Neck: Normal range of motion. Neck supple. No thyromegaly present.  Cardiovascular: Normal rate and regular rhythm.   Respiratory: Effort normal and breath sounds normal. No respiratory distress.  GI: Soft. Bowel sounds are normal. He exhibits no distension.  Musculoskeletal: He exhibits no edema or tenderness.  Neurological: He is alert and oriented to person, place, and time.  Speech is a bit dysarthric but intelligible.  He follows simple commands.  Fair awareness of deficits Sensation diminished to light touch LUE/LLE DTRs 3+ LUE/LLE Motor: RUE/RLE: 5/5 proximal distal LUE: 4/5 proximal distal LLE: Hip flexion, knee extension 4 -/5, ankle  dorsi/plantarflexion 3+/5  Skin: Skin is warm and dry.  Psychiatric: He has a normal mood and affect. His behavior is normal.    Results for orders placed or performed during the hospital encounter of 02/24/15 (from the past 24 hour(s))  Magnesium today at 2000     Status: None   Collection Time: 02/25/15  6:58 PM  Result Value Ref Range   Magnesium 2.1 1.7 - 2.4 mg/dL  BMET in AM     Status: Abnormal   Collection Time: 02/26/15  3:45 AM  Result Value Ref Range   Sodium 138 135 - 145 mmol/L   Potassium 3.8 3.5 - 5.1 mmol/L   Chloride 105 101 - 111 mmol/L   CO2 24 22 - 32 mmol/L   Glucose, Bld 102 (H) 65 - 99 mg/dL   BUN 9 6 - 20 mg/dL   Creatinine, Ser 1.00 0.61 - 1.24 mg/dL   Calcium 8.8 (L) 8.9 - 10.3 mg/dL   GFR calc non Af Amer >60 >60 mL/min   GFR calc Af Amer >60 >60 mL/min   Anion gap 9 5 - 15  Magnesium in AM     Status: None   Collection Time: 02/26/15  3:45 AM  Result Value Ref Range   Magnesium 1.7 1.7 - 2.4 mg/dL  CBC     Status: Abnormal   Collection Time: 02/26/15  3:45 AM  Result Value Ref Range   WBC 6.2 4.0 - 10.5 K/uL   RBC 3.70 (L) 4.22 - 5.81 MIL/uL   Hemoglobin 12.7 (L) 13.0 - 17.0 g/dL   HCT 71.2 (L) 19.7 - 58.8 %  MCV 99.5 78.0 - 100.0 fL   MCH 34.3 (H) 26.0 - 34.0 pg   MCHC 34.5 30.0 - 36.0 g/dL   RDW 40.9 81.1 - 91.4 %   Platelets 142 (L) 150 - 400 K/uL   Dg Chest Port 1 View  02/25/2015  CLINICAL DATA:  Respiratory failure. EXAM: PORTABLE CHEST 1 VIEW COMPARISON:  February 24, 2015. FINDINGS: Stable cardiomegaly. Endotracheal and nasogastric tubes noted on prior exam have been removed. No pneumothorax or pleural effusion is noted. No acute pulmonary disease is noted. Bony thorax is intact. IMPRESSION: No acute cardiopulmonary abnormality seen. Endotracheal and nasogastric tubes have been removed. Electronically Signed   By: Lupita Raider, M.D.   On: 02/25/2015 07:28    Assessment/Plan: Diagnosis: Right MCA territory infarct Labs and images  independently reviewed.  Records reviewed and summated above. Stroke: Continue secondary stroke prophylaxis and Risk Factor Modification listed below:   Antiplatelet therapy:   Blood Pressure Management:  Continue current medication with prn's with permisive HTN per primary team Statin Agent:   Diabetes management:  HbA1c pending Etoh/Cocaine/Tobacco abuse:  Cont to counsel Left sided hemiparesis: fit for orthotics to prevent contractures (resting hand splint for day, wrist cock up splint at night) Motor recovery: Fluoxetine  1. Does the need for close, 24 hr/day medical supervision in concert with the patient's rehab needs make it unreasonable for this patient to be served in a less intensive setting? Yes 2. Co-Morbidities requiring supervision/potential complications: combined CHF (Monitor in accordance with increased physical activity and avoid UE resistance excercises), cocaine abuse (cont to counsel), Alcohol abuse (CIWA, cont to counsel), tobacco abuse (cont to counsel), HTN (monitor and provide prns in accordance with increased physical exertion and pain), thrombocytopenia, leukocytosis 3. Due to safety, skin/wound care, disease management and patient education, does the patient require 24 hr/day rehab nursing? Yes 4. Does the patient  hours. require coordinated care of a physician, rehab nurse, PT (1-2 hrs/day, 5 days/week), OT (1-2 hrs/day, 5 days/week) and SLP (1-2 hrs/day, 5 days/week) to address physical and functional deficits in the context of the above medical diagnosis(es)? Yes Addressing deficits in the following areas: balance, endurance, locomotion, strength, transferring, toileting, speech and psychosocial support 5. Can the patient actively participate in an intensive therapy program of at least 3 hrs of therapy per day at least 5 days per week? Yes 6. The potential for patient to make measurable gains while on inpatient rehab is excellent 7. Anticipated functional outcomes  upon discharge from inpatient rehab are modified independent  with PT, modified independent with OT, independent and modified independent with SLP. 8. Estimated rehab length of stay to reach the above functional goals is: 7-10 days. 9. Does the patient have adequate social supports and living environment to accommodate these discharge functional goals? Yes 10. Anticipated D/C setting: Home 11. Anticipated post D/C treatments: HH therapy and Home excercise program 12. Overall Rehab/Functional Prognosis: good  RECOMMENDATIONS: This patient's condition is appropriate for continued rehabilitative care in the following setting: CIR Patient has agreed to participate in recommended program. Yes Note that insurance prior authorization may be required for reimbursement for recommended care.  Comment: Rehab Admissions Coordinator to follow up.  Maryla Morrow, MD 02/26/2015

## 2015-02-26 NOTE — Evaluation (Signed)
Physical Therapy Evaluation Patient Details Name: [name] MRN: [id number] DOB: 06/27/1965 Today's Date: 02/26/2015   History of Present Illness  50 y.o. male history of hypertension and heart failure presented for acute onset of slurred speech, altered mental status and reduced movements of left extremities. MRI showed Small acute cortically based infarct in the lateral right peri-Rolandic cortex, right MCA territory.  Clinical Impression  Patient seen for gait assessment and retraining. Patient demonstrates altered gait pattern with left sided deviations. During gait training, patient had heavy reliance on RW and required some minimal assist. Patient receptive and motivated this session. Continue to recommend CIR upon acute discharge secondary to patient prior level of independence and current deficits and need for assist/assistive device. Will continue to see and progress as tolerated.  OF NOTE: patient states prior to event he was working for a moving company and was entirely independent with all aspects of activity.    Follow Up Recommendations CIR    Equipment Recommendations  Rolling walker with 5" wheels    Recommendations for Other Services Rehab consult     Precautions / Restrictions Precautions Precautions: Fall Precaution Comments: up with assist Restrictions Weight Bearing Restrictions: No      Mobility  Bed Mobility Overal bed mobility: Needs Assistance Bed Mobility: Supine to Sit;Sit to Supine     Supine to sit: Min assist;HOB elevated Sit to supine: Min assist   General bed mobility comments: Min assist for movement and positioning of left side. Cues for self assist techniques.  Transfers Overall transfer level: Needs assistance Equipment used: Rolling walker (2 wheeled) Transfers: Sit to/from Stand Sit to Stand: Min assist         General transfer comment: VCs for hand placement,.min assist for stability. Cues for positioning and weight bearing  through left side  Ambulation/Gait Ambulation/Gait assistance: Min assist Ambulation Distance (Feet): 60 Feet (x2) Assistive device: Rolling walker (2 wheeled) (poor ability to attempt gait without assistive device) Gait Pattern/deviations: Step-through pattern;Decreased step length - left;Decreased stance time - left;Decreased stride length;Decreased dorsiflexion - left;Steppage     General Gait Details: Focus on giat retraining. decreased sensory LLE. Patient with exageratted knee flexion and poor placement and stride. VCs for heel strike and decreased knee flexion when advancing through loading response phase. Patient cued for upright posture and focus to task. Heavy reliance on RW for support during gait retraining.   Stairs            Wheelchair Mobility    Modified Rankin (Stroke Patients Only) Modified Rankin (Stroke Patients Only) Pre-Morbid Rankin Score: No symptoms Modified Rankin: Moderately severe disability     Balance   Sitting-balance support: Feet supported Sitting balance-Leahy Scale: Fair     Standing balance support: During functional activity Standing balance-Leahy Scale: Fair                               Pertinent Vitals/Pain Pain Assessment: 0-10 Pain Score: 6  Pain Location: headache Pain Descriptors / Indicators: Aching;Headache Pain Intervention(s): Monitored during session    Home Living Family/patient expects to be discharged to:: Private residence Living Arrangements: Spouse/significant other                    Prior Function                 Hand Dominance        Extremity/Trunk Assessment  Communication      Cognition Arousal/Alertness: Awake/alert Behavior During Therapy: WFL for tasks assessed/performed Overall Cognitive Status: Impaired/Different from baseline Area of Impairment: Problem solving             Problem Solving: Slow processing;Requires verbal  cues;Requires tactile cues      General Comments General comments (skin integrity, edema, etc.): educated on forced use of left side with activities. Educated on general LE ther ex with left side focus. Patient appears receptive.    Exercises        Assessment/Plan    PT Assessment    PT Diagnosis     PT Problem List    PT Treatment Interventions     PT Goals (Current goals can be found in the Care Plan section) Acute Rehab PT Goals Patient Stated Goal: to get back to normal PT Goal Formulation: With patient/family Time For Goal Achievement: 03/11/15 Potential to Achieve Goals: Good    Frequency Min 4X/week   Barriers to discharge        Co-evaluation               End of Session Equipment Utilized During Treatment: Gait belt Activity Tolerance: Patient tolerated treatment well Patient left: in bed;with call bell/phone within reach;with family/visitor present (to w/c for transfer to floor) Nurse Communication: Mobility status         Time: 1610-9604 PT Time Calculation (min) (ACUTE ONLY): 17 min   Charges:     PT Treatments $Gait Training: 8-22 mins   PT G CodesFabio Asa 2015-03-11, 11:05 AM Charlotte Crumb, PT DPT  [phone]

## 2015-02-26 NOTE — H&P (View-Only) (Signed)
Physical Medicine and Rehabilitation Admission H&P    Chief Complaint  Patient presents with  . Code Stroke  : HPI: [name] is a 50 y.o. right handed male with history of hypertension, chronic headaches,NICM, diastolic congestive heart failure, tobacco abuse and polysubstance abuse. Patient lives alone independently prior to admission. He has a fiance that can assist on discharge. Presented 02/24/2015 with altered mental status as well as slurred speech and left-sided weakness. MRI of the brain showed small acute cortical-based infarct lateral right MCA territory without hemorrhage. Alcohol level 214. Urine drug screen Positive cocaine. Echocardiogram with ejection fraction of 20% severe diffuse hypokinesis with grade 1 diastolic dysfunction. Carotid Dopplers with no ICA stenosis. EEG consistent with encephalopathy no seizure activity. Patient did not receive TPA. Neurology consulted maintained on aspirin for CVA prophylaxis. Subcutaneous Lovenox for DVT prophylaxis. Tolerating a regular consistency diet. Physical therapy evaluation completed 02/25/2015 with recommendations of physical medicine rehabilitation consult. Patient was admitted for a comprehensive rehabilitation program  ROS Constitutional: Negative for fever and chills.   Left side weakness  HENT: Negative for hearing loss.  Eyes: Negative for blurred vision and double vision.  Respiratory: Negative for cough and shortness of breath.  Cardiovascular: Positive for leg swelling. Negative for chest pain and palpitations.  Gastrointestinal: Negative for nausea, vomiting and abdominal pain.  Genitourinary: Negative for dysuria and hematuria.  Musculoskeletal: Positive for myalgias.  Skin: Negative for rash.  Neurological: Positive for headaches. Negative for seizures and loss of consciousness.  All other systems reviewed and are negative  Past Medical History  Diagnosis Date  . Hypertension   . Headache   . CHF  (congestive heart failure) [facility])    Past Surgical History  Procedure Laterality Date  . No past surgeries     Family History  Problem Relation Age of Onset  . Hypertension Mother    Social History:  reports that he has been smoking Cigarettes.  He has a 5 pack-year smoking history. He does not have any smokeless tobacco history on file. He reports that he drinks alcohol. He reports that he uses illicit drugs (Cocaine). Allergies:  Allergies  Allergen Reactions  . Penicillins Other (See Comments)    Childhood    Facility-administered medications prior to admission  Medication Dose Route Frequency Provider Last Rate Last Dose  . cloNIDine (CATAPRES) tablet 0.1 mg  0.1 mg Oral Once Renella Cunas, MD       Medications Prior to Admission  Medication Sig Dispense Refill  . aspirin 325 MG EC tablet Take 1 tablet (325 mg total) by mouth daily. 30 tablet 11  . furosemide (LASIX) 40 MG tablet Take 2 tablets (80 mg total) by mouth daily. 60 tablet 11  . ibuprofen (ADVIL,MOTRIN) 600 MG tablet Take 1 tablet (600 mg total) by mouth every 6 (six) hours as needed for moderate pain. 30 tablet 0  . lisinopril (PRINIVIL,ZESTRIL) 20 MG tablet Take 1 tablet (20 mg total) by mouth daily. 30 tablet 11  . methocarbamol (ROBAXIN) 500 MG tablet Take 2 tablets (1,000 mg total) by mouth every 8 (eight) hours as needed for muscle spasms. 30 tablet 0  . spironolactone (ALDACTONE) 25 MG tablet Take 0.5 tablets (12.5 mg total) by mouth daily. 60 tablet 11    Home: Home Living Family/patient expects to be discharged to:: Private residence Living Arrangements: Spouse/significant other Available Help at Discharge: Family, Available PRN/intermittently (finance) Type of Home: House Home Access: Stairs to enter [employer] of Steps:  3 (patients home; fiance has level entry) Home Layout: One level (finace has 2 stories with upstairs bedrooms) Home Equipment: None   Functional History: Prior  Function Level of Independence: Independent  Functional Status:  Mobility: Bed Mobility Overal bed mobility: Needs Assistance Bed Mobility: Supine to Sit, Sit to Supine Supine to sit: Min assist, HOB elevated Sit to supine: Min assist General bed mobility comments: Min assist for movement and positioning of left side. Cues for self assist techniques. Transfers Overall transfer level: Needs assistance Equipment used: Rolling walker (2 wheeled) Transfers: Sit to/from Stand Sit to Stand: Min assist General transfer comment: VCs for hand placement,.min assist for stability. Cues for positioning and weight bearing through left side Ambulation/Gait Ambulation/Gait assistance: Min assist Ambulation Distance (Feet): 60 Feet (x2) Assistive device: Rolling walker (2 wheeled) (poor ability to attempt gait without assistive device) Gait Pattern/deviations: Step-through pattern, Decreased step length - left, Decreased stance time - left, Decreased stride length, Decreased dorsiflexion - left, Steppage General Gait Details: Focus on giat retraining. decreased sensory LLE. Patient with exageratted knee flexion and poor placement and stride. VCs for heel strike and decreased knee flexion when advancing through loading response phase. Patient cued for upright posture and focus to task. Heavy reliance on RW for support during gait retraining.     ADL:    Cognition: Cognition Overall Cognitive Status: Impaired/Different from baseline Orientation Level: Oriented X4 Cognition Arousal/Alertness: Awake/alert Behavior During Therapy: WFL for tasks assessed/performed Overall Cognitive Status: Impaired/Different from baseline Area of Impairment: Problem solving Problem Solving: Slow processing, Requires verbal cues, Requires tactile cues  Physical Exam: Blood pressure 152/89, pulse 72, temperature 98.2 F (36.8 C), temperature source Oral, resp. rate 17, height 5' 8" (1.727 m), weight 67.631 kg (149  lb 1.6 oz), SpO2 100 %. Physical Exam Constitutional: He is oriented to person, place, and time. He appears well-developed and well-nourished.  HENT:  Head: Normocephalic and atraumatic.  Eyes: EOM are normal. Right eye exhibits no discharge. Left eye exhibits no discharge.  Neck: Normal range of motion. Neck supple. No thyromegaly present.  Cardiovascular: Normal rate and regular rhythm.  Respiratory: Effort normal and breath sounds normal. No respiratory distress.  GI: Soft. Bowel sounds are normal. He exhibits no distension.  Musculoskeletal: He exhibits no edema or tenderness.  Neurological: He is alert and oriented to person, place, and time.  Speech is a bit dysarthric but intelligible.  He follows simple commands.  Fair awareness of deficits Sensation diminished to light touch LUE/LLE DTRs 3+ LUE/LLE Motor: RUE/RLE: 5/5 proximal distal LUE: 4/5 proximal distal LLE: Hip flexion, knee extension 4 -/5, ankle dorsi/plantarflexion 3+/5  Skin: Skin is warm and dry.  Psychiatric: He has a normal mood and affect. His behavior is normal  Results for orders placed or performed during the hospital encounter of 02/24/15 (from the past 48 hour(s))  CBC     Status: Abnormal   Collection Time: 02/25/15  3:28 AM  Result Value Ref Range   WBC 6.8 4.0 - 10.5 K/uL   RBC 3.85 (L) 4.22 - 5.81 MIL/uL   Hemoglobin 13.1 13.0 - 17.0 g/dL   HCT 38.5 (L) 39.0 - 52.0 %   MCV 100.0 78.0 - 100.0 fL   MCH 34.0 26.0 - 34.0 pg   MCHC 34.0 30.0 - 36.0 g/dL   RDW 13.2 11.5 - 15.5 %   Platelets 136 (L) 150 - 400 K/uL  Basic metabolic panel     Status: Abnormal   Collection Time: 02/25/15    3:28 AM  Result Value Ref Range   Sodium 142 135 - 145 mmol/L   Potassium 3.1 (L) 3.5 - 5.1 mmol/L    Comment: DELTA CHECK NOTED   Chloride 110 101 - 111 mmol/L   CO2 24 22 - 32 mmol/L   Glucose, Bld 85 65 - 99 mg/dL   BUN 6 6 - 20 mg/dL   Creatinine, Ser 0.91 0.61 - 1.24 mg/dL   Calcium 8.5 (L) 8.9 - 10.3 mg/dL    GFR calc non Af Amer >60 >60 mL/min   GFR calc Af Amer >60 >60 mL/min    Comment: (NOTE) The eGFR has been calculated using the CKD EPI equation. This calculation has not been validated in all clinical situations. eGFR's persistently <60 mL/min signify possible Chronic Kidney Disease.    Anion gap 8 5 - 15  Magnesium     Status: Abnormal   Collection Time: 02/25/15  3:28 AM  Result Value Ref Range   Magnesium 1.4 (L) 1.7 - 2.4 mg/dL  Phosphorus     Status: None   Collection Time: 02/25/15  3:28 AM  Result Value Ref Range   Phosphorus 3.6 2.5 - 4.6 mg/dL  Lipid panel     Status: None   Collection Time: 02/25/15  3:28 AM  Result Value Ref Range   Cholesterol 131 0 - 200 mg/dL   Triglycerides 134 <150 mg/dL   HDL 83 >40 mg/dL   Total CHOL/HDL Ratio 1.6 RATIO   VLDL 27 0 - 40 mg/dL   LDL Cholesterol 21 0 - 99 mg/dL    Comment:        Total Cholesterol/HDL:CHD Risk Coronary Heart Disease Risk Table                     Men   Women  1/2 Average Risk   3.4   3.3  Average Risk       5.0   4.4  2 X Average Risk   9.6   7.1  3 X Average Risk  23.4   11.0        Use the calculated Patient Ratio above and the CHD Risk Table to determine the patient's CHD Risk.        ATP III CLASSIFICATION (LDL):  <100     mg/dL   Optimal  100-129  mg/dL   Near or Above                    Optimal  130-159  mg/dL   Borderline  160-189  mg/dL   High  >190     mg/dL   Very High   Magnesium today at 2000     Status: None   Collection Time: 02/25/15  6:58 PM  Result Value Ref Range   Magnesium 2.1 1.7 - 2.4 mg/dL  BMET in AM     Status: Abnormal   Collection Time: 02/26/15  3:45 AM  Result Value Ref Range   Sodium 138 135 - 145 mmol/L   Potassium 3.8 3.5 - 5.1 mmol/L    Comment: DELTA CHECK NOTED   Chloride 105 101 - 111 mmol/L   CO2 24 22 - 32 mmol/L   Glucose, Bld 102 (H) 65 - 99 mg/dL   BUN 9 6 - 20 mg/dL   Creatinine, Ser 0.91 0.61 - 1.24 mg/dL   Calcium 8.8 (L) 8.9 - 10.3 mg/dL    GFR calc non Af Amer >60 >60 mL/min     GFR calc Af Amer >60 >60 mL/min    Comment: (NOTE) The eGFR has been calculated using the CKD EPI equation. This calculation has not been validated in all clinical situations. eGFR's persistently <60 mL/min signify possible Chronic Kidney Disease.    Anion gap 9 5 - 15  Magnesium in AM     Status: None   Collection Time: 02/26/15  3:45 AM  Result Value Ref Range   Magnesium 1.7 1.7 - 2.4 mg/dL  CBC     Status: Abnormal   Collection Time: 02/26/15  3:45 AM  Result Value Ref Range   WBC 6.2 4.0 - 10.5 K/uL   RBC 3.70 (L) 4.22 - 5.81 MIL/uL   Hemoglobin 12.7 (L) 13.0 - 17.0 g/dL   HCT 36.8 (L) 39.0 - 52.0 %   MCV 99.5 78.0 - 100.0 fL   MCH 34.3 (H) 26.0 - 34.0 pg   MCHC 34.5 30.0 - 36.0 g/dL   RDW 12.8 11.5 - 15.5 %   Platelets 142 (L) 150 - 400 K/uL   Dg Chest Port 1 View  02/25/2015  CLINICAL DATA:  Respiratory failure. EXAM: PORTABLE CHEST 1 VIEW COMPARISON:  February 24, 2015. FINDINGS: Stable cardiomegaly. Endotracheal and nasogastric tubes noted on prior exam have been removed. No pneumothorax or pleural effusion is noted. No acute pulmonary disease is noted. Bony thorax is intact. IMPRESSION: No acute cardiopulmonary abnormality seen. Endotracheal and nasogastric tubes have been removed. Electronically Signed   By: Marijo Conception, M.D.   On: 02/25/2015 07:28       Medical Problem List and Plan: 1.  Left-sided weakness and dysarthria secondary to right MCA infarct 2.  DVT Prophylaxis/Anticoagulation: Subcutaneous Lovenox. Monitor platelet counts or signs of bleeding 3. Pain Management: Tylenol as needed 4. Hypertension. Lisinopril 20 mg daily, Aldactone 12.5 mg daily. Monitor with increased mobility 5. Neuropsych: This patient is capable of making decisions on his own behalf. 6. Skin/Wound Care: Routine skin checks 7. Fluids/Electrolytes/Nutrition: Routine I&O's with follow-up chemistries 8. Alcohol polysubstance abuse. Provide  counseling. 9.NICM. Echocardiogram with ejection fraction of 20%. Monitor with increased mobility 10. Diastolic congestive heart failure. Weigh patient daily  monitor for any signs of fluid overload.   Post Admission Physician Evaluation: 1. Functional deficits secondary  to right MCA infarct. 2. Patient is admitted to receive collaborative, interdisciplinary care between the physiatrist, rehab nursing staff, and therapy team. 3. Patient's level of medical complexity and substantial therapy needs in context of that medical necessity cannot be provided at a lesser intensity of care such as a SNF. 4. Patient has experienced substantial functional loss from his/her baseline which was documented above under the "Functional History" and "Functional Status" headings.  Judging by the patient's diagnosis, physical exam, and functional history, the patient has potential for functional progress which will result in measurable gains while on inpatient rehab.  These gains will be of substantial and practical use upon discharge  in facilitating mobility and self-care at the household level. 5. Physiatrist will provide 24 hour management of medical needs as well as oversight of the therapy plan/treatment and provide guidance as appropriate regarding the interaction of the two. 6. 24 hour rehab nursing will assist with safety, skin/wound care, disease management and patient education and help integrate therapy concepts, techniques,education, etc. 7. PT will assess and treat for/with: Lower extremity strength, range of motion, stamina, balance, functional mobility, safety, adaptive techniques and equipment, coping skills, pain control, stroke education.   Goals are: Mod I. 8. OT  will assess and treat for/with: ADL's, functional mobility, safety, upper extremity strength, adaptive techniques and equipment, ego support, and community reintegration.   Goals are: Mod I. Therapy may proceed with showering this  patient. 9. SLP will assess and treat for/with: speech and higher level cognition.  Goals are: Mod I/Ind. 10. Case Management and Social Worker will assess and treat for psychological issues and discharge planning. 11. Team conference will be held weekly to assess progress toward goals and to determine barriers to discharge. 12. Patient will receive at least 3 hours of therapy per day at least 5 days per week. 13. ELOS: 7-10 days.       14. Prognosis:  good  Delice Lesch, MD 02/26/2015

## 2015-02-26 NOTE — Progress Notes (Signed)
[facility]                                                                              Patient Demographics  [name], is a 50 y.o. male, DOB - April 25, 1965, OIB:[id number]  Admit date - 02/24/2015   Admitting Physician Jamie Kato, MD  Outpatient Primary MD for the patient is No PCP Per Patient  LOS - 2   Chief Complaint  Patient presents with  . Code Stroke       Brief HPI  Patient was admitted on 02/24/15 by PCCM. Per their admit note 23 yh/o man with hx of polysubstance abuse and NICM p/w sudden onset AMS and combative behavior requiring intubation.   Assessment & Plan   Physical problem Acute encephalopathy: Resolved, likely due to acute alcohol intoxication, cocaine, acute CVA, currently patient back to baseline, confirmed by family member at the bedside - Neurology was consulted, EEG showed mild abnormal EEG generalized nonspecific cerebral dysfunction.  Acute CVA- presenting with acute encephalopathy, left-sided weakness - Delayed presentation hence not a TPA candidate. Neurology was consulted. - MRI brain showed small acute cortical based infarct in the lateral right peri-rolandic cortex, right MCA territory, no hemorrhage or mass effect -  2-D echo showed EF 15-20%, grade 1 diastolic dysfunction, severe diffuse hypokinesis. Compared with prior echo 3/16, no significant change, EF was 20% with diffuse hypokinesis. Patient follows Dr. Daleen Squibb, cardiology at [facility]. - Carotid Dopplers showed 1-39% stenosis involving the right ICA and left ICA - Neurology recommended aspirin 325 mg daily - Lipid panel showed LDL  21  - Hemoglobin A1c pending - PT evaluation recommending CIR   Cocaine abuse/substance abuse - Patient strongly counseled to quit cocaine, explained about cardiomyopathy, EF 15-20% and the effect of cocaine   Acute hypoxic respiratory failure - Resolved, patient was intubated  in the ED, now 100% on room air   Hypokalemia,  hypomagnesemia - Replace    chronic combined systolic and diastolic CHF, cardiomyopathy - Currently on aspirin, ACE inhibitor, spironolactone   Code Status: full code  Family Communication: Discussed in detail with the patient, all imaging results, lab results explained to the patient  and family member at the bedside    Disposition Plan: Pending CIR  Time Spent in minutes  25 minutes  Procedures  Intubation MRI 2-D echo Carotid Dopplers  Consults    PC CM neurology  DVT Prophylaxis  Lovenox  Medications  Scheduled Meds: . aspirin  325 mg Oral Daily  . enoxaparin (LOVENOX) injection  40 mg Subcutaneous Daily  . folic acid  1 mg Oral Daily  . lisinopril  20 mg Oral Daily  . magnesium oxide  400 mg Oral BID  . multivitamin with minerals  1 tablet Oral Daily  . spironolactone  12.5 mg Oral Daily  . thiamine  100 mg Oral Daily   Continuous Infusions:  PRN Meds:.sodium chloride, LORazepam   Antibiotics   Anti-infectives    None        Subjective:   [name] was seen and examined today. Patient denies dizziness, chest pain, shortness of breath, abdominal pain, N/V/D/C, new weakness,  numbess, tingling. No acute events overnight.    Objective:   Blood pressure 152/89, pulse 72, temperature 98.2 F (36.8 C), temperature source Oral, resp. rate 17, height  (1.727 m), weight 67.631 kg (149 lb 1.6 oz), SpO2 100 %.  Wt Readings from Last 3 Encounters:  02/26/15 67.631 kg (149 lb 1.6 oz)  12/06/14 68.856 kg (151 lb 12.8 oz)  09/27/14 67.132 kg (148 lb)     Intake/Output Summary (Last 24 hours) at 02/26/15 1123 Last data filed at 02/26/15 0541  Gross per 24 hour  Intake    980 ml  Output    700 ml  Net    280 ml    Exam  General: Alert and oriented x 3, NAD  HEENT:  PERRLA, EOMI, Anicteric Sclera, mucous membranes moist.   Neck: Supple, no JVD, no masses  CVS: S1 S2 auscultated, no rubs, murmurs or gallops. Regular rate and  rhythm.  Respiratory: Clear to auscultation bilaterally, no wheezing, rales or rhonchi  Abdomen: Soft, nontender, nondistended, + bowel sounds  Ext: no cyanosis clubbing or edema  Neuro: AAOx3, Cr N's II- XII. Strength 5/5 upper and lower ext on the right, left 4/5 upper and lower ext  skin: No rashes  Psych: Normal affect and demeanor, alert and oriented x3    Data Review   Micro Results Recent Results (from the past 240 hour(s))  Urine culture     Status: None   Collection Time: 02/24/15  4:18 AM  Result Value Ref Range Status   Specimen Description URINE, CATHETERIZED  Final   Special Requests Normal  Final   Culture NO GROWTH 1 DAY  Final   Report Status 02/25/2015 FINAL  Final  MRSA PCR Screening     Status: None   Collection Time: 02/24/15  7:02 AM  Result Value Ref Range Status   MRSA by PCR NEGATIVE NEGATIVE Final    Comment:        The GeneXpert MRSA Assay (FDA approved for NASAL specimens only), is one component of a comprehensive MRSA colonization surveillance program. It is not intended to diagnose MRSA infection nor to guide or monitor treatment for MRSA infections.     Radiology Reports Ct Head Wo Contrast  02/24/2015  CLINICAL DATA:  Code stroke. Left-sided weakness. Left facial droop. EXAM: CT HEAD WITHOUT CONTRAST TECHNIQUE: Contiguous axial images were obtained from the base of the skull through the vertex without intravenous contrast. COMPARISON:  Remote head CT 05/07/2004 FINDINGS: No intracranial hemorrhage, mass effect, or midline shift. No hydrocephalus. The basilar cisterns are patent. No evidence of territorial infarct. No intracranial fluid collection. Calvarium is intact. Included paranasal sinuses and mastoid air cells are well aerated. IMPRESSION: No acute intracranial abnormality on noncontrast head CT. No intracranial hemorrhage. These results were called by telephone at the time of interpretation on 02/24/2015 at 3:15 am to Dr. Roseanne Reno , who  verbally acknowledged these results. Electronically Signed   By: Rubye Oaks M.D.   On: 02/24/2015 03:17   Mr Brain Wo Contrast  02/24/2015  CLINICAL DATA:  50 year old male code stroke with altered mental status and decreased left extremity movement. Initial encounter. EXAM: MRI HEAD WITHOUT CONTRAST TECHNIQUE: Multiplanar, multiecho pulse sequences of the brain and surrounding structures were obtained without intravenous contrast. COMPARISON:  Head CT without contrast hours today. Brain MRI 05/08/2004. FINDINGS: Major intracranial vascular flow voids are stable. There is a small cortically based area of restricted diffusion in the lateral right peri-Rolandic cortex encompassing  about 15 mm (series 4, image 31). No associated hemorrhage or mass effect. No other restricted diffusion. Other gray and white matter signal is within normal limits ; asymmetric sulcus FLAIR signal at the right operculum on is not correlated with signal abnormality on any other sequence. No chronic cerebral blood products. The patient is intubated. Fluid in the pharynx. No midline shift, mass effect, evidence of mass lesion, ventriculomegaly, extra-axial collection or acute intracranial hemorrhage. Cervicomedullary junction and pituitary are within normal limits. Negative visualized cervical spine. Normal bone marrow signal. Mild mastoid effusions. Mild to moderate paranasal sinus mucosal thickening. Left superior convexity scalp soft tissue scarring (series 9, image 76) is chronic. IMPRESSION: 1. Small acute cortically based infarct in the lateral right peri-Rolandic cortex, right MCA territory. No associated hemorrhage or mass effect. 2. Otherwise negative noncontrast MRI appearance of the brain. 3. Intubated. Electronically Signed   By: Odessa Fleming M.D.   On: 02/24/2015 06:41   Dg Chest Port 1 View  02/25/2015  CLINICAL DATA:  Respiratory failure. EXAM: PORTABLE CHEST 1 VIEW COMPARISON:  February 24, 2015. FINDINGS: Stable  cardiomegaly. Endotracheal and nasogastric tubes noted on prior exam have been removed. No pneumothorax or pleural effusion is noted. No acute pulmonary disease is noted. Bony thorax is intact. IMPRESSION: No acute cardiopulmonary abnormality seen. Endotracheal and nasogastric tubes have been removed. Electronically Signed   By: Lupita Raider, M.D.   On: 02/25/2015 07:28   Dg Chest Portable 1 View  02/24/2015  CLINICAL DATA:  Intubation. EXAM: PORTABLE CHEST 1 VIEW COMPARISON:  01/22/2015 FINDINGS: Endotracheal tube with tip at the clavicular heads. An orogastric tube reaches the stomach at least. Cardiomegaly with normal aortic and hilar contours. Clear lungs. No effusion or pneumothorax. IMPRESSION: Endotracheal and orogastric tubes are in good position. Cardiomegaly. Electronically Signed   By: Marnee Spring M.D.   On: 02/24/2015 04:03    CBC  Recent Labs Lab 02/24/15 0303 02/24/15 0312 02/25/15 0328 02/26/15 0345  WBC 5.5  --  6.8 6.2  HGB 14.3 16.0 13.1 12.7*  HCT 40.6 47.0 38.5* 36.8*  PLT 175  --  136* 142*  MCV 98.5  --  100.0 99.5  MCH 34.7*  --  34.0 34.3*  MCHC 35.2  --  34.0 34.5  RDW 12.7  --  13.2 12.8  LYMPHSABS 1.6  --   --   --   MONOABS 0.4  --   --   --   EOSABS 0.1  --   --   --   BASOSABS 0.0  --   --   --     Chemistries   Recent Labs Lab 02/24/15 0303 02/24/15 0312 02/24/15 1030 02/25/15 0328 02/25/15 1858 02/26/15 0345  NA 141 140  --  142  --  138  K 4.0 3.9  --  3.1*  --  3.8  CL 105 106  --  110  --  105  CO2 20*  --   --  24  --  24  GLUCOSE 92 88  --  85  --  102*  BUN 6 11  --  6  --  9  CREATININE 0.95 1.20  --  0.91  --  0.91  CALCIUM 9.4  --   --  8.5*  --  8.8*  MG  --   --  1.8 1.4* 2.1 1.7  AST 42*  --   --   --   --   --   ALT  30  --   --   --   --   --   ALKPHOS 84  --   --   --   --   --   BILITOT 0.9  --   --   --   --   --     ------------------------------------------------------------------------------------------------------------------ estimated creatinine clearance is 93.9 mL/min (by C-G formula based on Cr of 0.91). ------------------------------------------------------------------------------------------------------------------ No results for input(s): HGBA1C in the last 72 hours. ------------------------------------------------------------------------------------------------------------------  Recent Labs  02/25/15 0328  CHOL 131  HDL 83  LDLCALC 21  TRIG 134  CHOLHDL 1.6   ------------------------------------------------------------------------------------------------------------------ No results for input(s): TSH, T4TOTAL, T3FREE, THYROIDAB in the last 72 hours.  Invalid input(s): FREET3 ------------------------------------------------------------------------------------------------------------------ No results for input(s): VITAMINB12, FOLATE, FERRITIN, TIBC, IRON, RETICCTPCT in the last 72 hours.  Coagulation profile  Recent Labs Lab 02/24/15 0303  INR 0.95    No results for input(s): DDIMER in the last 72 hours.  Cardiac Enzymes No results for input(s): CKMB, TROPONINI, MYOGLOBIN in the last 168 hours.  Invalid input(s): CK ------------------------------------------------------------------------------------------------------------------ Invalid input(s): POCBNP  No results for input(s): GLUCAP in the last 72 hours.   RAI,RIPUDEEP M.D. [facility] 02/26/2015, 11:23 AM  Pager: [phone] Between 7am to 7pm - call Pager - [phone]  After 7pm go to [email or web address] - password [email or web address]  Call night coverage person covering after 7pm

## 2015-02-26 NOTE — Progress Notes (Signed)
Ankit Karis Juba, MD Physician Signed Physical Medicine and Rehabilitation Consult Note 02/26/2015 8:46 AM  Related encounter: ED to Hosp-Admission (Current) from 02/24/2015 in [facility] CHF    Expand All Collapse All        Physical Medicine and Rehabilitation Consult Reason for Consult: Right MCA territory infarct Referring Physician: [facility]  HPI: [name] is a 50 y.o. right handed male with history of hypertension, chronic headaches,NICM, diastolic congestive heart failure, tobacco abuse and polysubstance abuse. Patient lives alone independently prior to admission. He has a fiance that can assist on discharge. Presented 02/24/2015 with altered mental status as well as slurred speech and left-sided weakness. MRI of the brain showed small acute cortical-based infarct lateral right MCA territory without hemorrhage. Alcohol level 214. Positive cocaine. Echocardiogram with ejection fraction of 20% severe diffuse hypokinesis with grade 1 diastolic dysfunction. Carotid Dopplers with no ICA stenosis. EEG consistent with encephalopathy no seizure activity. Patient did not receive TPA. Neurology consulted maintained on aspirin for CVA prophylaxis. Subcutaneous Lovenox for DVT prophylaxis. Tolerating a regular consistency diet. Physical therapy evaluation completed 02/25/2015 with recommendations of physical medicine rehabilitation consult.  Review of Systems  Constitutional: Negative for fever and chills.   Left side weakness  HENT: Negative for hearing loss.  Eyes: Negative for blurred vision and double vision.  Respiratory: Negative for cough and shortness of breath.  Cardiovascular: Positive for leg swelling. Negative for chest pain and palpitations.  Gastrointestinal: Negative for nausea, vomiting and abdominal pain.  Genitourinary: Negative for dysuria and hematuria.  Musculoskeletal: Positive for myalgias.  Skin: Negative for rash.  Neurological: Positive  for headaches. Negative for seizures and loss of consciousness.  All other systems reviewed and are negative.  Past Medical History  Diagnosis Date  . Hypertension   . Headache   . CHF (congestive heart failure) [facility])    Past Surgical History  Procedure Laterality Date  . No past surgeries     Family History  Problem Relation Age of Onset  . Hypertension Mother    Social History:  reports that he has been smoking Cigarettes. He has a 5 pack-year smoking history. He does not have any smokeless tobacco history on file. He reports that he drinks alcohol. He reports that he uses illicit drugs (Cocaine). Allergies:  Allergies  Allergen Reactions  . Penicillins Other (See Comments)    Childhood    Facility-administered medications prior to admission  Medication Dose Route Frequency Provider Last Rate Last Dose  . cloNIDine (CATAPRES) tablet 0.1 mg 0.1 mg Oral Once Gaylord Shih, MD     Medications Prior to Admission  Medication Sig Dispense Refill  . aspirin 325 MG EC tablet Take 1 tablet (325 mg total) by mouth daily. 30 tablet 11  . furosemide (LASIX) 40 MG tablet Take 2 tablets (80 mg total) by mouth daily. 60 tablet 11  . ibuprofen (ADVIL,MOTRIN) 600 MG tablet Take 1 tablet (600 mg total) by mouth every 6 (six) hours as needed for moderate pain. 30 tablet 0  . lisinopril (PRINIVIL,ZESTRIL) 20 MG tablet Take 1 tablet (20 mg total) by mouth daily. 30 tablet 11  . methocarbamol (ROBAXIN) 500 MG tablet Take 2 tablets (1,000 mg total) by mouth every 8 (eight) hours as needed for muscle spasms. 30 tablet 0  . spironolactone (ALDACTONE) 25 MG tablet Take 0.5 tablets (12.5 mg total) by mouth daily. 60 tablet 11    Home: Home Living Family/patient expects to be discharged to:: Private  residence Living Arrangements: Alone, Spouse/significant other (depending on whose home he goes to  ) Available Help at Discharge: Family, Available PRN/intermittently (finance) Type of Home: House Home Access: Stairs to enter [employer] of Steps: 3 (patients home; fiance has level entry) Home Layout: One level (finace has 2 stories with upstairs bedrooms) Home Equipment: None  Functional History: Prior Function Level of Independence: Independent Functional Status:  Mobility: Bed Mobility Overal bed mobility: Needs Assistance Bed Mobility: Supine to Sit Supine to sit: Min assist, HOB elevated Transfers Overall transfer level: Needs assistance Equipment used: 1 person hand held assist Transfers: Sit to/from Stand Sit to Stand: Min assist Ambulation/Gait Ambulation/Gait assistance: (to be assessed further next session)    ADL:    Cognition: Cognition Overall Cognitive Status: Within Functional Limits for tasks assessed (brief interaction, needs reassess next session) Orientation Level: Oriented X4 Cognition Arousal/Alertness: Awake/alert Behavior During Therapy: WFL for tasks assessed/performed Overall Cognitive Status: Within Functional Limits for tasks assessed (brief interaction, needs reassess next session)  Blood pressure 152/89, pulse 72, temperature 98.2 F (36.8 C), temperature source Oral, resp. rate 17, height  (1.727 m), weight 67.631 kg (149 lb 1.6 oz), SpO2 100 %. Physical Exam  Vitals reviewed. Constitutional: He is oriented to person, place, and time. He appears well-developed and well-nourished.  HENT:  Head: Normocephalic and atraumatic.  Eyes: EOM are normal. Right eye exhibits no discharge. Left eye exhibits no discharge.  Neck: Normal range of motion. Neck supple. No thyromegaly present.  Cardiovascular: Normal rate and regular rhythm.  Respiratory: Effort normal and breath sounds normal. No respiratory distress.  GI: Soft. Bowel sounds are normal. He exhibits no distension.  Musculoskeletal: He exhibits no edema or  tenderness.  Neurological: He is alert and oriented to person, place, and time.  Speech is a bit dysarthric but intelligible.  He follows simple commands.  Fair awareness of deficits Sensation diminished to light touch LUE/LLE DTRs 3+ LUE/LLE Motor: RUE/RLE: 5/5 proximal distal LUE: 4/5 proximal distal LLE: Hip flexion, knee extension 4 -/5, ankle dorsi/plantarflexion 3+/5  Skin: Skin is warm and dry.  Psychiatric: He has a normal mood and affect. His behavior is normal.     Lab Results Last 24 Hours    Results for orders placed or performed during the hospital encounter of 02/24/15 (from the past 24 hour(s))  Magnesium today at 2000 Status: None   Collection Time: 02/25/15 6:58 PM  Result Value Ref Range   Magnesium 2.1 1.7 - 2.4 mg/dL  BMET in AM Status: Abnormal   Collection Time: 02/26/15 3:45 AM  Result Value Ref Range   Sodium 138 135 - 145 mmol/L   Potassium 3.8 3.5 - 5.1 mmol/L   Chloride 105 101 - 111 mmol/L   CO2 24 22 - 32 mmol/L   Glucose, Bld 102 (H) 65 - 99 mg/dL   BUN 9 6 - 20 mg/dL   Creatinine, Ser 1.61 0.61 - 1.24 mg/dL   Calcium 8.8 (L) 8.9 - 10.3 mg/dL   GFR calc non Af Amer >60 >60 mL/min   GFR calc Af Amer >60 >60 mL/min   Anion gap 9 5 - 15  Magnesium in AM Status: None   Collection Time: 02/26/15 3:45 AM  Result Value Ref Range   Magnesium 1.7 1.7 - 2.4 mg/dL  CBC Status: Abnormal   Collection Time: 02/26/15 3:45 AM  Result Value Ref Range   WBC 6.2 4.0 - 10.5 K/uL   RBC 3.70 (L) 4.22 - 5.81  MIL/uL   Hemoglobin 12.7 (L) 13.0 - 17.0 g/dL   HCT 16.1 (L) 09.6 - 04.5 %   MCV 99.5 78.0 - 100.0 fL   MCH 34.3 (H) 26.0 - 34.0 pg   MCHC 34.5 30.0 - 36.0 g/dL   RDW 40.9 81.1 - 91.4 %   Platelets 142 (L) 150 - 400 K/uL      Imaging Results (Last 48 hours)    Dg Chest Port 1 View  02/25/2015 CLINICAL DATA: Respiratory  failure. EXAM: PORTABLE CHEST 1 VIEW COMPARISON: February 24, 2015. FINDINGS: Stable cardiomegaly. Endotracheal and nasogastric tubes noted on prior exam have been removed. No pneumothorax or pleural effusion is noted. No acute pulmonary disease is noted. Bony thorax is intact. IMPRESSION: No acute cardiopulmonary abnormality seen. Endotracheal and nasogastric tubes have been removed. Electronically Signed By: Lupita Raider, M.D. On: 02/25/2015 07:28     Assessment/Plan: Diagnosis: Right MCA territory infarct Labs and images independently reviewed. Records reviewed and summated above. Stroke: Continue secondary stroke prophylaxis and Risk Factor Modification listed below:  Antiplatelet therapy:  Blood Pressure Management: Continue current medication with prn's with permisive HTN per primary team Statin Agent:  Diabetes management: HbA1c pending Etoh/Cocaine/Tobacco abuse: Cont to counsel Left sided hemiparesis: fit for orthotics to prevent contractures (resting hand splint for day, wrist cock up splint at night) Motor recovery: Fluoxetine  1. Does the need for close, 24 hr/day medical supervision in concert with the patient's rehab needs make it unreasonable for this patient to be served in a less intensive setting? Yes 2. Co-Morbidities requiring supervision/potential complications: combined CHF (Monitor in accordance with increased physical activity and avoid UE resistance excercises), cocaine abuse (cont to counsel), Alcohol abuse (CIWA, cont to counsel), tobacco abuse (cont to counsel), HTN (monitor and provide prns in accordance with increased physical exertion and pain), thrombocytopenia, leukocytosis 3. Due to safety, skin/wound care, disease management and patient education, does the patient require 24 hr/day rehab nursing? Yes 4. Does the patient hours. require coordinated care of a physician, rehab nurse, PT (1-2 hrs/day, 5 days/week), OT (1-2 hrs/day, 5 days/week)  and SLP (1-2 hrs/day, 5 days/week) to address physical and functional deficits in the context of the above medical diagnosis(es)? Yes Addressing deficits in the following areas: balance, endurance, locomotion, strength, transferring, toileting, speech and psychosocial support 5. Can the patient actively participate in an intensive therapy program of at least 3 hrs of therapy per day at least 5 days per week? Yes 6. The potential for patient to make measurable gains while on inpatient rehab is excellent 7. Anticipated functional outcomes upon discharge from inpatient rehab are modified independent with PT, modified independent with OT, independent and modified independent with SLP. 8. Estimated rehab length of stay to reach the above functional goals is: 7-10 days. 9. Does the patient have adequate social supports and living environment to accommodate these discharge functional goals? Yes 10. Anticipated D/C setting: Home 11. Anticipated post D/C treatments: HH therapy and Home excercise program 12. Overall Rehab/Functional Prognosis: good  RECOMMENDATIONS: This patient's condition is appropriate for continued rehabilitative care in the following setting: CIR Patient has agreed to participate in recommended program. Yes Note that insurance prior authorization may be required for reimbursement for recommended care.  Comment: Rehab Admissions Coordinator to follow up.  Maryla Morrow, MD 02/26/2015       Revision History     Date/Time User Provider Type Action   02/26/2015 11:22 AM Ankit Karis Juba, MD Physician Sign   02/26/2015 9:05  AM Charlton Amor, PA-C Physician Assistant Pend   View Details Report       Routing History     Date/Time From To Method   02/26/2015 11:22 AM Ankit Karis Juba, MD Ankit Karis Juba, MD In [facility]   02/26/2015 11:22 AM Ankit Karis Juba, MD No Pcp Per Patient In Basket

## 2015-02-26 NOTE — H&P (Signed)
Physical Medicine and Rehabilitation Admission H&P    Chief Complaint  Patient presents with  . Code Stroke  : HPI: [name] is a 50 y.o. right handed male with history of hypertension, chronic headaches,NICM, diastolic congestive heart failure, tobacco abuse and polysubstance abuse. Patient lives alone independently prior to admission. He has a fiance that can assist on discharge. Presented 02/24/2015 with altered mental status as well as slurred speech and left-sided weakness. MRI of the brain showed small acute cortical-based infarct lateral right MCA territory without hemorrhage. Alcohol level 214. Urine drug screen Positive cocaine. Echocardiogram with ejection fraction of 20% severe diffuse hypokinesis with grade 1 diastolic dysfunction. Carotid Dopplers with no ICA stenosis. EEG consistent with encephalopathy no seizure activity. Patient did not receive TPA. Neurology consulted maintained on aspirin for CVA prophylaxis. Subcutaneous Lovenox for DVT prophylaxis. Tolerating a regular consistency diet. Physical therapy evaluation completed 02/25/2015 with recommendations of physical medicine rehabilitation consult. Patient was admitted for a comprehensive rehabilitation program  ROS Constitutional: Negative for fever and chills.   Left side weakness  HENT: Negative for hearing loss.  Eyes: Negative for blurred vision and double vision.  Respiratory: Negative for cough and shortness of breath.  Cardiovascular: Positive for leg swelling. Negative for chest pain and palpitations.  Gastrointestinal: Negative for nausea, vomiting and abdominal pain.  Genitourinary: Negative for dysuria and hematuria.  Musculoskeletal: Positive for myalgias.  Skin: Negative for rash.  Neurological: Positive for headaches. Negative for seizures and loss of consciousness.  All other systems reviewed and are negative  Past Medical History  Diagnosis Date  . Hypertension   . Headache   . CHF  (congestive heart failure) [facility])    Past Surgical History  Procedure Laterality Date  . No past surgeries     Family History  Problem Relation Age of Onset  . Hypertension Mother    Social History:  reports that he has been smoking Cigarettes.  He has a 5 pack-year smoking history. He does not have any smokeless tobacco history on file. He reports that he drinks alcohol. He reports that he uses illicit drugs (Cocaine). Allergies:  Allergies  Allergen Reactions  . Penicillins Other (See Comments)    Childhood    Facility-administered medications prior to admission  Medication Dose Route Frequency Provider Last Rate Last Dose  . cloNIDine (CATAPRES) tablet 0.1 mg  0.1 mg Oral Once Renella Cunas, MD       Medications Prior to Admission  Medication Sig Dispense Refill  . aspirin 325 MG EC tablet Take 1 tablet (325 mg total) by mouth daily. 30 tablet 11  . furosemide (LASIX) 40 MG tablet Take 2 tablets (80 mg total) by mouth daily. 60 tablet 11  . ibuprofen (ADVIL,MOTRIN) 600 MG tablet Take 1 tablet (600 mg total) by mouth every 6 (six) hours as needed for moderate pain. 30 tablet 0  . lisinopril (PRINIVIL,ZESTRIL) 20 MG tablet Take 1 tablet (20 mg total) by mouth daily. 30 tablet 11  . methocarbamol (ROBAXIN) 500 MG tablet Take 2 tablets (1,000 mg total) by mouth every 8 (eight) hours as needed for muscle spasms. 30 tablet 0  . spironolactone (ALDACTONE) 25 MG tablet Take 0.5 tablets (12.5 mg total) by mouth daily. 60 tablet 11    Home: Home Living Family/patient expects to be discharged to:: Private residence Living Arrangements: Spouse/significant other Available Help at Discharge: Family, Available PRN/intermittently (finance) Type of Home: House Home Access: Stairs to enter [employer] of Steps:  3 (patients home; fiance has level entry) Home Layout: One level (finace has 2 stories with upstairs bedrooms) Home Equipment: None   Functional History: Prior  Function Level of Independence: Independent  Functional Status:  Mobility: Bed Mobility Overal bed mobility: Needs Assistance Bed Mobility: Supine to Sit, Sit to Supine Supine to sit: Min assist, HOB elevated Sit to supine: Min assist General bed mobility comments: Min assist for movement and positioning of left side. Cues for self assist techniques. Transfers Overall transfer level: Needs assistance Equipment used: Rolling walker (2 wheeled) Transfers: Sit to/from Stand Sit to Stand: Min assist General transfer comment: VCs for hand placement,.min assist for stability. Cues for positioning and weight bearing through left side Ambulation/Gait Ambulation/Gait assistance: Min assist Ambulation Distance (Feet): 60 Feet (x2) Assistive device: Rolling walker (2 wheeled) (poor ability to attempt gait without assistive device) Gait Pattern/deviations: Step-through pattern, Decreased step length - left, Decreased stance time - left, Decreased stride length, Decreased dorsiflexion - left, Steppage General Gait Details: Focus on giat retraining. decreased sensory LLE. Patient with exageratted knee flexion and poor placement and stride. VCs for heel strike and decreased knee flexion when advancing through loading response phase. Patient cued for upright posture and focus to task. Heavy reliance on RW for support during gait retraining.     ADL:    Cognition: Cognition Overall Cognitive Status: Impaired/Different from baseline Orientation Level: Oriented X4 Cognition Arousal/Alertness: Awake/alert Behavior During Therapy: WFL for tasks assessed/performed Overall Cognitive Status: Impaired/Different from baseline Area of Impairment: Problem solving Problem Solving: Slow processing, Requires verbal cues, Requires tactile cues  Physical Exam: Blood pressure 152/89, pulse 72, temperature 98.2 F (36.8 C), temperature source Oral, resp. rate 17, height 5' 8" (1.727 m), weight 67.631 kg (149  lb 1.6 oz), SpO2 100 %. Physical Exam Constitutional: He is oriented to person, place, and time. He appears well-developed and well-nourished.  HENT:  Head: Normocephalic and atraumatic.  Eyes: EOM are normal. Right eye exhibits no discharge. Left eye exhibits no discharge.  Neck: Normal range of motion. Neck supple. No thyromegaly present.  Cardiovascular: Normal rate and regular rhythm.  Respiratory: Effort normal and breath sounds normal. No respiratory distress.  GI: Soft. Bowel sounds are normal. He exhibits no distension.  Musculoskeletal: He exhibits no edema or tenderness.  Neurological: He is alert and oriented to person, place, and time.  Speech is a bit dysarthric but intelligible.  He follows simple commands.  Fair awareness of deficits Sensation diminished to light touch LUE/LLE DTRs 3+ LUE/LLE Motor: RUE/RLE: 5/5 proximal distal LUE: 4/5 proximal distal LLE: Hip flexion, knee extension 4 -/5, ankle dorsi/plantarflexion 3+/5  Skin: Skin is warm and dry.  Psychiatric: He has a normal mood and affect. His behavior is normal  Results for orders placed or performed during the hospital encounter of 02/24/15 (from the past 48 hour(s))  CBC     Status: Abnormal   Collection Time: 02/25/15  3:28 AM  Result Value Ref Range   WBC 6.8 4.0 - 10.5 K/uL   RBC 3.85 (L) 4.22 - 5.81 MIL/uL   Hemoglobin 13.1 13.0 - 17.0 g/dL   HCT 38.5 (L) 39.0 - 52.0 %   MCV 100.0 78.0 - 100.0 fL   MCH 34.0 26.0 - 34.0 pg   MCHC 34.0 30.0 - 36.0 g/dL   RDW 13.2 11.5 - 15.5 %   Platelets 136 (L) 150 - 400 K/uL  Basic metabolic panel     Status: Abnormal   Collection Time: 02/25/15  3:28 AM  Result Value Ref Range   Sodium 142 135 - 145 mmol/L   Potassium 3.1 (L) 3.5 - 5.1 mmol/L    Comment: DELTA CHECK NOTED   Chloride 110 101 - 111 mmol/L   CO2 24 22 - 32 mmol/L   Glucose, Bld 85 65 - 99 mg/dL   BUN 6 6 - 20 mg/dL   Creatinine, Ser 0.91 0.61 - 1.24 mg/dL   Calcium 8.5 (L) 8.9 - 10.3 mg/dL    GFR calc non Af Amer >60 >60 mL/min   GFR calc Af Amer >60 >60 mL/min    Comment: (NOTE) The eGFR has been calculated using the CKD EPI equation. This calculation has not been validated in all clinical situations. eGFR's persistently <60 mL/min signify possible Chronic Kidney Disease.    Anion gap 8 5 - 15  Magnesium     Status: Abnormal   Collection Time: 02/25/15  3:28 AM  Result Value Ref Range   Magnesium 1.4 (L) 1.7 - 2.4 mg/dL  Phosphorus     Status: None   Collection Time: 02/25/15  3:28 AM  Result Value Ref Range   Phosphorus 3.6 2.5 - 4.6 mg/dL  Lipid panel     Status: None   Collection Time: 02/25/15  3:28 AM  Result Value Ref Range   Cholesterol 131 0 - 200 mg/dL   Triglycerides 134 <150 mg/dL   HDL 83 >40 mg/dL   Total CHOL/HDL Ratio 1.6 RATIO   VLDL 27 0 - 40 mg/dL   LDL Cholesterol 21 0 - 99 mg/dL    Comment:        Total Cholesterol/HDL:CHD Risk Coronary Heart Disease Risk Table                     Men   Women  1/2 Average Risk   3.4   3.3  Average Risk       5.0   4.4  2 X Average Risk   9.6   7.1  3 X Average Risk  23.4   11.0        Use the calculated Patient Ratio above and the CHD Risk Table to determine the patient's CHD Risk.        ATP III CLASSIFICATION (LDL):  <100     mg/dL   Optimal  100-129  mg/dL   Near or Above                    Optimal  130-159  mg/dL   Borderline  160-189  mg/dL   High  >190     mg/dL   Very High   Magnesium today at 2000     Status: None   Collection Time: 02/25/15  6:58 PM  Result Value Ref Range   Magnesium 2.1 1.7 - 2.4 mg/dL  BMET in AM     Status: Abnormal   Collection Time: 02/26/15  3:45 AM  Result Value Ref Range   Sodium 138 135 - 145 mmol/L   Potassium 3.8 3.5 - 5.1 mmol/L    Comment: DELTA CHECK NOTED   Chloride 105 101 - 111 mmol/L   CO2 24 22 - 32 mmol/L   Glucose, Bld 102 (H) 65 - 99 mg/dL   BUN 9 6 - 20 mg/dL   Creatinine, Ser 0.91 0.61 - 1.24 mg/dL   Calcium 8.8 (L) 8.9 - 10.3 mg/dL    GFR calc non Af Amer >60 >60 mL/min  GFR calc Af Amer >60 >60 mL/min    Comment: (NOTE) The eGFR has been calculated using the CKD EPI equation. This calculation has not been validated in all clinical situations. eGFR's persistently <60 mL/min signify possible Chronic Kidney Disease.    Anion gap 9 5 - 15  Magnesium in AM     Status: None   Collection Time: 02/26/15  3:45 AM  Result Value Ref Range   Magnesium 1.7 1.7 - 2.4 mg/dL  CBC     Status: Abnormal   Collection Time: 02/26/15  3:45 AM  Result Value Ref Range   WBC 6.2 4.0 - 10.5 K/uL   RBC 3.70 (L) 4.22 - 5.81 MIL/uL   Hemoglobin 12.7 (L) 13.0 - 17.0 g/dL   HCT 36.8 (L) 39.0 - 52.0 %   MCV 99.5 78.0 - 100.0 fL   MCH 34.3 (H) 26.0 - 34.0 pg   MCHC 34.5 30.0 - 36.0 g/dL   RDW 12.8 11.5 - 15.5 %   Platelets 142 (L) 150 - 400 K/uL   Dg Chest Port 1 View  02/25/2015  CLINICAL DATA:  Respiratory failure. EXAM: PORTABLE CHEST 1 VIEW COMPARISON:  February 24, 2015. FINDINGS: Stable cardiomegaly. Endotracheal and nasogastric tubes noted on prior exam have been removed. No pneumothorax or pleural effusion is noted. No acute pulmonary disease is noted. Bony thorax is intact. IMPRESSION: No acute cardiopulmonary abnormality seen. Endotracheal and nasogastric tubes have been removed. Electronically Signed   By: Marijo Conception, M.D.   On: 02/25/2015 07:28       Medical Problem List and Plan: 1.  Left-sided weakness and dysarthria secondary to right MCA infarct 2.  DVT Prophylaxis/Anticoagulation: Subcutaneous Lovenox. Monitor platelet counts or signs of bleeding 3. Pain Management: Tylenol as needed 4. Hypertension. Lisinopril 20 mg daily, Aldactone 12.5 mg daily. Monitor with increased mobility 5. Neuropsych: This patient is capable of making decisions on his own behalf. 6. Skin/Wound Care: Routine skin checks 7. Fluids/Electrolytes/Nutrition: Routine I&O's with follow-up chemistries 8. Alcohol polysubstance abuse. Provide  counseling. 9.NICM. Echocardiogram with ejection fraction of 20%. Monitor with increased mobility 10. Diastolic congestive heart failure. Weigh patient daily  monitor for any signs of fluid overload.   Post Admission Physician Evaluation: 1. Functional deficits secondary  to right MCA infarct. 2. Patient is admitted to receive collaborative, interdisciplinary care between the physiatrist, rehab nursing staff, and therapy team. 3. Patient's level of medical complexity and substantial therapy needs in context of that medical necessity cannot be provided at a lesser intensity of care such as a SNF. 4. Patient has experienced substantial functional loss from his/her baseline which was documented above under the "Functional History" and "Functional Status" headings.  Judging by the patient's diagnosis, physical exam, and functional history, the patient has potential for functional progress which will result in measurable gains while on inpatient rehab.  These gains will be of substantial and practical use upon discharge  in facilitating mobility and self-care at the household level. 5. Physiatrist will provide 24 hour management of medical needs as well as oversight of the therapy plan/treatment and provide guidance as appropriate regarding the interaction of the two. 6. 24 hour rehab nursing will assist with safety, skin/wound care, disease management and patient education and help integrate therapy concepts, techniques,education, etc. 7. PT will assess and treat for/with: Lower extremity strength, range of motion, stamina, balance, functional mobility, safety, adaptive techniques and equipment, coping skills, pain control, stroke education.   Goals are: Mod I. 8. OT  will assess and treat for/with: ADL's, functional mobility, safety, upper extremity strength, adaptive techniques and equipment, ego support, and community reintegration.   Goals are: Mod I. Therapy may proceed with showering this  patient. 9. SLP will assess and treat for/with: speech and higher level cognition.  Goals are: Mod I/Ind. 10. Case Management and Social Worker will assess and treat for psychological issues and discharge planning. 11. Team conference will be held weekly to assess progress toward goals and to determine barriers to discharge. 12. Patient will receive at least 3 hours of therapy per day at least 5 days per week. 13. ELOS: 7-10 days.       14. Prognosis:  good  Delice Lesch, MD 02/26/2015

## 2015-02-26 NOTE — PMR Pre-admission (Signed)
PMR Admission Coordinator Pre-Admission Assessment  Patient: [name] is an 50 y.o., male MRN: [id number] DOB: 03/09/1965 Height: 5\' 8"  (172.7 cm) Weight: 67.631 kg (149 lb 1.6 oz) (scale c)              Insurance Information Self pay - no insurance  Medicaid Application Date:        Case Manager:   Disability Application Date:        Case Worker:    Emergency Conservator, museum/gallery Information    Name Relation Home Work Mobile   Dargan,Kristina Significant other   [phone]   [place] [phone]       Current Medical History  Patient Admitting Diagnosis:  R MCA infarct  History of Present Illness: A 50 y.o. right handed male with history of hypertension, chronic headaches,NICM, diastolic congestive heart failure, tobacco abuse and polysubstance abuse. Patient lives alone independently prior to admission. He has a fiance that can assist on discharge. Presented 02/24/2015 with altered mental status as well as slurred speech and left-sided weakness. MRI of the brain showed small acute cortical-based infarct lateral right MCA territory without hemorrhage. Alcohol level 214. Positive cocaine. Echocardiogram with ejection fraction of 20% severe diffuse hypokinesis with grade 1 diastolic dysfunction. Carotid Dopplers with no ICA stenosis. EEG consistent with encephalopathy no seizure activity. Patient did not receive TPA. Neurology consulted maintained on aspirin for CVA prophylaxis. Subcutaneous Lovenox for DVT prophylaxis. Tolerating a regular consistency diet. Physical therapy evaluation completed 02/25/2015 with recommendations of physical medicine rehabilitation consult.    Total: 0=NIH  Past Medical History  Past Medical History  Diagnosis Date  . Hypertension   . Headache   . CHF (congestive heart failure) (HCC)     Family History  family history includes Hypertension in his mother.  Prior Rehab/Hospitalizations: No previous rehab.   Has the  patient had major surgery during 100 days prior to admission? No  Current Medications   Current facility-administered medications:  .  0.9 %  sodium chloride infusion, 250 mL, Intravenous, PRN, Jamie Kato, MD, Last Rate: 20 mL/hr at 02/24/15 2000, 250 mL at 02/24/15 2000 .  aspirin EC tablet 325 mg, 325 mg, Oral, Daily, Jamie Kato, MD, 325 mg at 02/26/15 0943 .  enoxaparin (LOVENOX) injection 40 mg, 40 mg, Subcutaneous, Daily, Jamie Kato, MD, 40 mg at 02/26/15 0944 .  fluticasone (FLONASE) 50 MCG/ACT nasal spray 1 spray, 1 spray, Each Nare, Daily, Ripudeep K Rai, MD .  folic acid (FOLVITE) tablet 1 mg, 1 mg, Oral, Daily, Alyson Reedy, MD, 1 mg at 02/26/15 0943 .  lisinopril (PRINIVIL,ZESTRIL) tablet 20 mg, 20 mg, Oral, Daily, Jamie Kato, MD, 20 mg at 02/26/15 0943 .  LORazepam (ATIVAN) injection 2 mg, 2 mg, Intravenous, Q4H PRN, Jamie Kato, MD .  magnesium oxide (MAG-OX) tablet 400 mg, 400 mg, Oral, BID, Ripudeep K Rai, MD, 400 mg at 02/26/15 0943 .  multivitamin with minerals tablet 1 tablet, 1 tablet, Oral, Daily, Alyson Reedy, MD, 1 tablet at 02/26/15 [phone] .  spironolactone (ALDACTONE) tablet 12.5 mg, 12.5 mg, Oral, Daily, Jamie Kato, MD, 12.5 mg at 02/26/15 0943 .  thiamine (VITAMIN B-1) tablet 100 mg, 100 mg, Oral, Daily, Alyson Reedy, MD, 100 mg at 02/26/15 7711  Patients Current Diet: Diet Heart Room service appropriate?: Yes; Fluid consistency:: Thin  Precautions / Restrictions Precautions Precautions: Fall Precaution Comments: up with assist Restrictions Weight Bearing Restrictions: No   Has the patient had 2 or more  falls or a fall with injury in the past year?Yes.  Reports 4 or more falls with no injury.  Prior Activity Level Community (5-7x/wk): Went out daily.  Worked in the "moving business".  Home Assistive Devices / Equipment Home Assistive Devices/Equipment: None Home Equipment: None  Prior Device Use: Indicate devices/aids used by the  patient prior to current illness, exacerbation or injury? Cane, but reports rarely using the cane.  Prior Functional Level Prior Function Level of Independence: Independent  Self Care: Did the patient need help bathing, dressing, using the toilet or eating?  Independent  Indoor Mobility: Did the patient need assistance with walking from room to room (with or without device)? Independent  Stairs: Did the patient need assistance with internal or external stairs (with or without device)? Independent  Functional Cognition: Did the patient need help planning regular tasks such as shopping or remembering to take medications? Independent  Current Functional Level Cognition  Overall Cognitive Status: Impaired/Different from baseline Orientation Level: Oriented X4    Extremity Assessment (includes Sensation/Coordination)  Upper Extremity Assessment: Defer to OT evaluation  Lower Extremity Assessment: LLE deficits/detail LLE Deficits / Details: generally weak with mild motor control deficit when advancing limb for transfers/gait but able to maintain self in standing short bouts    ADLs       Mobility  Overal bed mobility: Needs Assistance Bed Mobility: Supine to Sit, Sit to Supine Supine to sit: Min assist, HOB elevated Sit to supine: Min assist General bed mobility comments: Min assist for movement and positioning of left side. Cues for self assist techniques.    Transfers  Overall transfer level: Needs assistance Equipment used: Rolling walker (2 wheeled) Transfers: Sit to/from Stand Sit to Stand: Min assist General transfer comment: VCs for hand placement,.min assist for stability. Cues for positioning and weight bearing through left side    Ambulation / Gait / Stairs / Wheelchair Mobility  Ambulation/Gait Ambulation/Gait assistance: Min assist Ambulation Distance (Feet): 60 Feet (x2) Assistive device: Rolling walker (2 wheeled) (poor ability to attempt gait without assistive  device) Gait Pattern/deviations: Step-through pattern, Decreased step length - left, Decreased stance time - left, Decreased stride length, Decreased dorsiflexion - left, Steppage General Gait Details: Focus on giat retraining. decreased sensory LLE. Patient with exageratted knee flexion and poor placement and stride. VCs for heel strike and decreased knee flexion when advancing through loading response phase. Patient cued for upright posture and focus to task. Heavy reliance on RW for support during gait retraining.     Posture / Balance Balance Overall balance assessment: Needs assistance Sitting-balance support: Feet supported Sitting balance-Leahy Scale: Fair Standing balance support: During functional activity Standing balance-Leahy Scale: Fair    Special needs/care consideration BiPAP/CPAP No CPM No Continuous Drip IV KVO in both arms Dialysis No       Life Vest No Oxygen No Special Bed No Trach Size No Wound Vac (area) No       Skin No                             Bowel mgmt: Last BM 02/25/15 Bladder mgmt: Voiding in urinal WDL Diabetic mgmt No    Previous Home Environment Living Arrangements: Spouse/significant other Available Help at Discharge: Family, Available PRN/intermittently (finance) Type of Home: House Home Layout: One level (finace has 2 stories with upstairs bedrooms) Home Access: Stairs to enter [employer] of Steps: 3 (patients home; fiance has level entry) Home Care  Services: No  Discharge Living Setting Plans for Discharge Living Setting: Alone, House (Lives alone.) Type of Home at Discharge: House Discharge Home Layout: One level Discharge Home Access: Stairs to enter Entrance Stairs-Number of Steps: 2 Does the patient have any problems obtaining your medications?: No  Social/Family/Support Systems Patient Roles: Parent, Other (Comment) (Has a fiance and a son.) Contact Information: Daneen Schick - fiance [phone] Anticipated  Caregiver: self and fiance Ability/Limitations of Caregiver: Foye Clock is currently not working, but may be working in the next 1-2 weeks Caregiver Availability: Intermittent Discharge Plan Discussed with Primary Caregiver: Yes Is Caregiver In Agreement with Plan?: Yes Does Caregiver/Family have Issues with Lodging/Transportation while Pt is in Rehab?: No  Goals/Additional Needs Patient/Family Goal for Rehab: PT/OT mod I, ST mod I and I goals Expected length of stay: 7-10 days Cultural Considerations: None Dietary Needs: Heart diet, thin liquids Equipment Needs: TBD Pt/Family Agrees to Admission and willing to participate: Yes Program Orientation Provided & Reviewed with Pt/Caregiver Including Roles  & Responsibilities: Yes  Decrease burden of Care through IP rehab admission: N/A  Possible need for SNF placement upon discharge: Not anticipated  Patient Condition: This patient's condition remains as documented in the consult dated 02/26/15, in which the Rehabilitation Physician determined and documented that the patient's condition is appropriate for intensive rehabilitative care in an inpatient rehabilitation facility. Will admit to inpatient rehab today.  Preadmission Screen Completed By:  Trish Mage, 02/26/2015 3:09 PM ______________________________________________________________________   Discussed status with Dr. [name] on 02/26/15 at 1508 and received telephone approval for admission today.  Admission Coordinator:  Trish Mage, time1508/Date02/06/17

## 2015-02-26 NOTE — Progress Notes (Signed)
Pt has orders for inpatient rehab. Report called to 4w. Pt in stable condition and will be wheeled up to 4w.

## 2015-02-26 NOTE — Interval H&P Note (Signed)
[name] was admitted today to Inpatient Rehabilitation with the diagnosis of right MCA infarct.  The patient's history has been reviewed, patient examined, and there is no change in status.  Patient continues to be appropriate for intensive inpatient rehabilitation.  I have reviewed the patient's chart and labs.  Questions were answered to the patient's satisfaction. The PAPE has been reviewed and assessment remains appropriate.  Ankit Karis Juba 02/26/2015, 8:38 PM

## 2015-02-26 NOTE — Progress Notes (Signed)
Rehab admissions - I met with patient and his fiance.  They would like inpatient rehab admission prior to discharge home.  Bed available on rehab and will admit to inpatient rehab today.  Call me for questions.  #[phone]

## 2015-02-26 NOTE — Progress Notes (Signed)
Pt arrived at 1715 with family at bedside. RN reviewed rehab booklet, process, and safety plan with verbal understanding. Family at bedside, call bell within reach.

## 2015-02-26 NOTE — Discharge Summary (Signed)
Physician Discharge Summary   Patient ID: [name] MRN: [id number] DOB/AGE: [birth year] 50 y.o.  Admit date: 02/24/2015 Discharge date: 02/26/2015  Primary Care Physician:  [facility]  Discharge Diagnoses:    . Delirium . acute encephalopathy Acute CVA Acute alcohol intoxication Cocaine abuse Hypertension Acute hypoxic respiratory failure Chronic combined systolic and diastolic CHF, cardiomyopathy  Consults:   PCCM Neurology CIR   Recommendations for Outpatient Follow-up:  1. Continue aspirin 325 mg daily 2. Patient counseled strongly on cocaine cessation 3. He will need cardiology follow-up, neurology follow-up at the time of final discharge.   DIET: Heart healthy diet    Allergies:   Allergies  Allergen Reactions  . Penicillins Other (See Comments)    Childhood      DISCHARGE MEDICATIONS: Current Discharge Medication List    START taking these medications   Details  fluticasone (FLONASE) 50 MCG/ACT nasal spray Place 1 spray into both nostrils daily. Refills: 2    magnesium oxide (MAG-OX) 400 (241.3 Mg) MG tablet Take 1 tablet (400 mg total) by mouth daily.      CONTINUE these medications which have NOT CHANGED   Details  aspirin 325 MG EC tablet Take 1 tablet (325 mg total) by mouth daily. Qty: 30 tablet, Refills: 11    furosemide (LASIX) 40 MG tablet Take 2 tablets (80 mg total) by mouth daily. Qty: 60 tablet, Refills: 11   Associated Diagnoses: Acute systolic CHF (congestive heart failure), NYHA class 1 (HCC)    ibuprofen (ADVIL,MOTRIN) 600 MG tablet Take 1 tablet (600 mg total) by mouth every 6 (six) hours as needed for moderate pain. Qty: 30 tablet, Refills: 0    lisinopril (PRINIVIL,ZESTRIL) 20 MG tablet Take 1 tablet (20 mg total) by mouth daily. Qty: 30 tablet, Refills: 11   Associated Diagnoses: Acute systolic CHF (congestive heart failure), NYHA class 1 (HCC)    methocarbamol (ROBAXIN) 500 MG tablet Take 2 tablets  (1,000 mg total) by mouth every 8 (eight) hours as needed for muscle spasms. Qty: 30 tablet, Refills: 0    spironolactone (ALDACTONE) 25 MG tablet Take 0.5 tablets (12.5 mg total) by mouth daily. Qty: 60 tablet, Refills: 11   Associated Diagnoses: Acute systolic CHF (congestive heart failure), NYHA class 1 (HCC)         Brief H and P: For complete details please refer to admission H and P, but in brief Patient was admitted on 02/24/15 by PCCM. Per their admit note 50 yh/o man with hx of polysubstance abuse and NICM p/w sudden onset AMS and combative behavior requiring intubation  Hospital Course:  Acute encephalopathy: Resolved, likely due to acute alcohol intoxication, cocaine, acute CVA, currently patient back to baseline, confirmed by family member at the bedside - Neurology was consulted, EEG showed mild abnormal EEG generalized nonspecific cerebral dysfunction.  Acute CVA- presenting with acute encephalopathy, left-sided weakness - Delayed presentation hence not a TPA candidate. Neurology was consulted. - MRI brain showed small acute cortical based infarct in the lateral right peri-rolandic cortex, right MCA territory, no hemorrhage or mass effect - 2-D echo showed EF 15-20%, grade 1 diastolic dysfunction, severe diffuse hypokinesis. Compared with prior echo 3/16, no significant change, EF was 20% with diffuse hypokinesis. Patient follows Dr. Daleen Squibb, cardiology at [facility]. - Carotid Dopplers showed 1-39% stenosis involving the right ICA and left ICA - Neurology recommended aspirin 325 mg daily - Lipid panel showed LDL 21  - Hemoglobin A1c 5.2 - PT evaluation recommended CIR, patient  accepted to inpatient rehabilitation   Cocaine abuse/substance abuse - Patient strongly counseled to quit cocaine, explained about cardiomyopathy, EF 15-20% and the effect of cocaine   Acute hypoxic respiratory failure - Resolved, patient was intubated in the ED, now 100% on room air    Hypokalemia, hypomagnesemia - Replace   chronic combined systolic and diastolic CHF, cardiomyopathy - Currently on aspirin, ACE inhibitor, spironolactone   Day of Discharge BP 149/100 mmHg  Pulse 70  Temp(Src) 98.2 F (36.8 C) (Oral)  Resp 17  Ht 5\' 8"  (1.727 m)  Wt 67.631 kg (149 lb 1.6 oz)  BMI 22.68 kg/m2  SpO2 99%  Physical Exam: General: Alert and awake oriented x3 not in any acute distress. HEENT: anicteric sclera, pupils reactive to light and accommodation CVS: S1-S2 clear no murmur rubs or gallops Chest: clear to auscultation bilaterally, no wheezing rales or rhonchi Abdomen: soft nontender, nondistended, normal bowel sounds Extremities: no cyanosis, clubbing or edema noted bilaterally Neuro: left-sided weakness   The results of significant diagnostics from this hospitalization (including imaging, microbiology, ancillary and laboratory) are listed below for reference.    LAB RESULTS: Basic Metabolic Panel:  Recent Labs Lab 02/25/15 0328  02/26/15 0345  NA 142  --  138  K 3.1*  --  3.8  CL 110  --  105  CO2 24  --  24  GLUCOSE 85  --  102*  BUN 6  --  9  CREATININE 0.91  --  0.91  CALCIUM 8.5*  --  8.8*  MG 1.4*  < > 1.7  PHOS 3.6  --   --   < > = values in this interval not displayed. Liver Function Tests:  Recent Labs Lab 02/24/15 0303  AST 42*  ALT 30  ALKPHOS 84  BILITOT 0.9  PROT 6.8  ALBUMIN 4.1   No results for input(s): LIPASE, AMYLASE in the last 168 hours. No results for input(s): AMMONIA in the last 168 hours. CBC:  Recent Labs Lab 02/24/15 0303  02/25/15 0328 02/26/15 0345  WBC 5.5  --  6.8 6.2  NEUTROABS 3.5  --   --   --   HGB 14.3  < > 13.1 12.7*  HCT 40.6  < > 38.5* 36.8*  MCV 98.5  --  100.0 99.5  PLT 175  --  136* 142*  < > = values in this interval not displayed. Cardiac Enzymes: No results for input(s): CKTOTAL, CKMB, CKMBINDEX, TROPONINI in the last 168 hours. BNP: Invalid input(s): POCBNP CBG: No  results for input(s): GLUCAP in the last 168 hours.  Significant Diagnostic Studies:  Ct Head Wo Contrast  02/24/2015  CLINICAL DATA:  Code stroke. Left-sided weakness. Left facial droop. EXAM: CT HEAD WITHOUT CONTRAST TECHNIQUE: Contiguous axial images were obtained from the base of the skull through the vertex without intravenous contrast. COMPARISON:  Remote head CT 05/07/2004 FINDINGS: No intracranial hemorrhage, mass effect, or midline shift. No hydrocephalus. The basilar cisterns are patent. No evidence of territorial infarct. No intracranial fluid collection. Calvarium is intact. Included paranasal sinuses and mastoid air cells are well aerated. IMPRESSION: No acute intracranial abnormality on noncontrast head CT. No intracranial hemorrhage. These results were called by telephone at the time of interpretation on 02/24/2015 at 3:15 am to Dr. Roseanne Reno , who verbally acknowledged these results. Electronically Signed   By: Rubye Oaks M.D.   On: 02/24/2015 03:17   Mr Brain Wo Contrast  02/24/2015  CLINICAL DATA:  50 year old male code stroke  with altered mental status and decreased left extremity movement. Initial encounter. EXAM: MRI HEAD WITHOUT CONTRAST TECHNIQUE: Multiplanar, multiecho pulse sequences of the brain and surrounding structures were obtained without intravenous contrast. COMPARISON:  Head CT without contrast hours today. Brain MRI 05/08/2004. FINDINGS: Major intracranial vascular flow voids are stable. There is a small cortically based area of restricted diffusion in the lateral right peri-Rolandic cortex encompassing about 15 mm (series 4, image 31). No associated hemorrhage or mass effect. No other restricted diffusion. Other gray and white matter signal is within normal limits ; asymmetric sulcus FLAIR signal at the right operculum on is not correlated with signal abnormality on any other sequence. No chronic cerebral blood products. The patient is intubated. Fluid in the pharynx. No  midline shift, mass effect, evidence of mass lesion, ventriculomegaly, extra-axial collection or acute intracranial hemorrhage. Cervicomedullary junction and pituitary are within normal limits. Negative visualized cervical spine. Normal bone marrow signal. Mild mastoid effusions. Mild to moderate paranasal sinus mucosal thickening. Left superior convexity scalp soft tissue scarring (series 9, image 76) is chronic. IMPRESSION: 1. Small acute cortically based infarct in the lateral right peri-Rolandic cortex, right MCA territory. No associated hemorrhage or mass effect. 2. Otherwise negative noncontrast MRI appearance of the brain. 3. Intubated. Electronically Signed   By: Odessa Fleming M.D.   On: 02/24/2015 06:41   Dg Chest Port 1 View  02/25/2015  CLINICAL DATA:  Respiratory failure. EXAM: PORTABLE CHEST 1 VIEW COMPARISON:  February 24, 2015. FINDINGS: Stable cardiomegaly. Endotracheal and nasogastric tubes noted on prior exam have been removed. No pneumothorax or pleural effusion is noted. No acute pulmonary disease is noted. Bony thorax is intact. IMPRESSION: No acute cardiopulmonary abnormality seen. Endotracheal and nasogastric tubes have been removed. Electronically Signed   By: Lupita Raider, M.D.   On: 02/25/2015 07:28   Dg Chest Portable 1 View  02/24/2015  CLINICAL DATA:  Intubation. EXAM: PORTABLE CHEST 1 VIEW COMPARISON:  01/22/2015 FINDINGS: Endotracheal tube with tip at the clavicular heads. An orogastric tube reaches the stomach at least. Cardiomegaly with normal aortic and hilar contours. Clear lungs. No effusion or pneumothorax. IMPRESSION: Endotracheal and orogastric tubes are in good position. Cardiomegaly. Electronically Signed   By: Marnee Spring M.D.   On: 02/24/2015 04:03    2D ECHO: Study Conclusions  - Left ventricle: The cavity size was mildly dilated. There was moderate concentric hypertrophy. Systolic function was normal. The estimated ejection fraction was in the range of 15%  to 20%. Severe diffuse hypokinesis. Although no diagnostic regional wall motion abnormality was identified, this possibility cannot be completely excluded on the basis of this study. Doppler parameters are consistent with abnormal left ventricular relaxation (grade 1 diastolic dysfunction). - Mitral valve: There was mild regurgitation. - Left atrium: The atrium was moderately dilated.  Disposition and Follow-up:    DISPOSITION: Inpatient rehabilitation   DISCHARGE FOLLOW-UP Follow-up Information    Follow up with SETHI,PRAMOD, MD. Schedule an appointment as soon as possible for a visit in 2 months.   Specialties:  Neurology, Radiology   Why:  for hospital follow-up/stroke   Contact information:   [address] [place] [address] [phone]       Follow up with Valera Castle, MD. Schedule an appointment as soon as possible for a visit in 2 weeks.   Specialty:  Cardiology   Why:  for hospital follow-up   Contact information:   [address] [place] [address] [phone]  Follow up with [facility]. Schedule an appointment as soon as possible for a visit in 2 weeks.   Why:  for hospital follow-up   Contact information:   [address] [address] [phone]       Time spent on Discharge:   Signed:   RAI,RIPUDEEP M.D. [facility] 02/26/2015, 3:34 PM Pager: [phone]

## 2015-02-26 NOTE — Progress Notes (Signed)
STROKE TEAM PROGRESS NOTE   HISTORY At the time of admission: [name] is an 50 y.o. male history of hypertension and heart failure brought to the emergency room and code stroke status for acute onset of slurred speech, altered mental status and reduced movements of left extremities. Patient was last known well at 2:00 AM today. His fiance indicated that he had sudden change in mental status and speech was garbled and he was not moving his left side. EMS noticed lack of movement of left side as well as facial droop and slurred speech. He became agitated in route to the emergency room and was given milligrams of Versed. He received an additional 2 mg of Ativan, as well as 5 mg of Haldol. Patient remained markedly agitated. Initially appeared to have left-sided weakness which subsequently resolved, as did his gaze to the right side. CT scan of his head showed no acute intracranial abnormality. Patient was intubated and placed on mechanical ventilation, as well as on propofol for sedation. He has no previous history of stroke nor TIA. There is also no history of his activity. He reportedly had not been drinking alcohol. Blood alcohol and urine drug screen are pending. Treatment intervention with TPA was deferred when patient's focal deficits resolved.   SUBJECTIVE (INTERVAL HISTORY) His significant other is at the bedside.  He is neurologically stable and is without complaints.     OBJECTIVE Temp:  [98 F (36.7 C)-98.7 F (37.1 C)] 98.5 F (36.9 C) (02/06 1123) Pulse Rate:  [62-84] 62 (02/06 1123) Cardiac Rhythm:  [-] Normal sinus rhythm (02/06 0824) Resp:  [16-18] 16 (02/06 1123) BP: (140-160)/(89-101) 140/93 mmHg (02/06 1123) SpO2:  [100 %] 100 % (02/06 1123) Weight:  [149 lb 1.6 oz (67.631 kg)] 149 lb 1.6 oz (67.631 kg) (02/06 0334)  CBC:   Recent Labs Lab 02/24/15 0303  02/25/15 0328 02/26/15 0345  WBC 5.5  --  6.8 6.2  NEUTROABS 3.5  --   --   --   HGB 14.3  < > 13.1 12.7*   HCT 40.6  < > 38.5* 36.8*  MCV 98.5  --  100.0 99.5  PLT 175  --  136* 142*  < > = values in this interval not displayed.  Basic Metabolic Panel:   Recent Labs Lab 02/24/15 1030 02/25/15 0328 02/25/15 1858 02/26/15 0345  NA  --  142  --  138  K  --  3.1*  --  3.8  CL  --  110  --  105  CO2  --  24  --  24  GLUCOSE  --  85  --  102*  BUN  --  6  --  9  CREATININE  --  0.91  --  0.91  CALCIUM  --  8.5*  --  8.8*  MG 1.8 1.4* 2.1 1.7  PHOS 4.0 3.6  --   --     Lipid Panel:     Component Value Date/Time   CHOL 131 02/25/2015 0328   TRIG 134 02/25/2015 0328   HDL 83 02/25/2015 0328   CHOLHDL 1.6 02/25/2015 0328   VLDL 27 02/25/2015 0328   LDLCALC 21 02/25/2015 0328   HgbA1c:  Lab Results  Component Value Date   HGBA1C 5.2 02/25/2015   Urine Drug Screen:     Component Value Date/Time   LABOPIA NONE DETECTED 02/24/2015 0418   COCAINSCRNUR POSITIVE* 02/24/2015 0418   LABBENZ POSITIVE* 02/24/2015 0418   AMPHETMU NONE DETECTED 02/24/2015  [place] DETECTED 02/24/2015 0418   LABBARB NONE DETECTED 02/24/2015 0418    IMAGING  Ct Head Wo Contrast 02/24/2015   No acute intracranial abnormality on noncontrast head CT. No intracranial hemorrhage.   Mr Brain Wo Contrast 02/24/2015   1. Small acute cortically based infarct in the lateral right peri-Rolandic cortex, right MCA territory. No associated hemorrhage or mass effect.  2. Otherwise negative noncontrast MRI appearance of the brain.  3. Intubated.    Dg Chest Portable 1 View 02/24/2015   Endotracheal and orogastric tubes are in good position. Cardiomegaly.   PHYSICAL EXAM HEENT- Normocephalic, no lesions, without obvious abnormality. Normal external eye and conjunctiva.PERRL  Neck supple with no masses, nodes, nodules or enlargement. Cardiovascular - regular rate and rhythm, S1, S2 normal, no murmur, click, rub or gallop Lungs - chest clear, no wheezing, rales, normal symmetric air entry Abdomen -  soft, non-tender; bowel sounds normal; no masses, no organomegaly Extremities - no joint deformities, effusion, or inflammation and no edema  Neurologic Examination: Patient awakens easily and follows commands and answers orientation questions  CRANIAL NERVES Pupils were equal and reacted normally to light. Extraocular movements were intact with right and left lateral eye movements. Face mild left lower face weakness Corneals intact Positive gag  MOTOR  Motor exam 5/5 throughout right side; 4+/5 right UE; 3+/5 right lower extremity  SENSORY:  Light touch intact throughout except less in the left lower extremity  GAIT/COORDINATION:  Deferred at this time  ASSESSMENT/PLAN [name] is a 50 y.o. male with history of hypertension, tobacco use, alcohol use, headaches, and congestive heart failure presenting with transitory left hemiparesis, slurred speech, altered mental status, right gaze preference, and agitation. He did not receive IV t-PA due to resolution of left hemiparesis.  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  MRA - not performed  Carotid Doppler - Preliminary report: Bilateral: 1-39% ICA stenosis. Vertebral artery flow is antegrade.   2D Echo- Left ventricle: The cavity size was mildly dilated. There was moderate concentric hypertrophy. Systolic function was normal. The estimated ejection fraction was in the range of 15% to 20%. Severe diffuse hypokinesis.  LDL - 21  HgbA1c - 5.2  VTE prophylaxis - Lovenox Diet Heart Room service appropriate?: Yes; Fluid consistency:: Thin  aspirin 325 mg daily prior to admission, now on aspirin 325 mg daily  Patient counseled to be compliant with his antithrombotic medications  Ongoing  aggressive stroke risk factor management  Therapy recommendations: CLR Disposition: CLR Hypertension  Low at times  Permissive hypertension (OK if < 220/120) but gradually normalize in 5-7 days   Hyperlipidemia  Home meds: No lipid lowering medications prior to admission.  LDL - 21, goal < 70  Other Stroke Risk Factors  Cigarette smoker, advised to stop smoking  ETOH use  Coronary artery disease  Cocaine abuse; primary team has discussed the risks related to cocaine  Other Active Problems  UDS positive for cocaine  Hospital day # 2     Patient was seen and examined by me personally. I reviewed notes, independently viewed imaging studies, participated in medical decision making and plan of care. I have made additions or clarifications directly to the above note.  Documentation accurately reflects findings. The laboratory and radiographic studies were personally reviewed by me.  ROS pertinent positives fully documented; no complaints   Condition is stabilizing  This patient is improving.  Etiology of stroke is likely cardioembolic but patient may not be a good candidate for long-term anticoagulation given his alcohol and drug abuse state is needed for close medical monitoring and not be met and we will not do TEE, loop recorder. Recommend aspirin and transfer  to inpatient rehabilitation when bed available. SIGNED BY: Dr. Antony Contras, MD     To contact Stroke Continuity provider, please refer to [email or web address] After hours, contact General Neurology

## 2015-02-27 ENCOUNTER — Inpatient Hospital Stay (HOSPITAL_COMMUNITY): Payer: Medicaid Other | Admitting: Speech Pathology

## 2015-02-27 ENCOUNTER — Inpatient Hospital Stay (HOSPITAL_COMMUNITY): Payer: Medicaid Other

## 2015-02-27 ENCOUNTER — Inpatient Hospital Stay (HOSPITAL_COMMUNITY): Payer: Self-pay | Admitting: Physical Therapy

## 2015-02-27 ENCOUNTER — Inpatient Hospital Stay (HOSPITAL_COMMUNITY): Payer: Medicaid Other | Admitting: Occupational Therapy

## 2015-02-27 DIAGNOSIS — D62 Acute posthemorrhagic anemia: Secondary | ICD-10-CM | POA: Insufficient documentation

## 2015-02-27 LAB — COMPREHENSIVE METABOLIC PANEL
ALBUMIN: 3.3 g/dL — AB (ref 3.5–5.0)
ALK PHOS: 64 U/L (ref 38–126)
ALT: 40 U/L (ref 17–63)
AST: 57 U/L — AB (ref 15–41)
Anion gap: 10 (ref 5–15)
BILIRUBIN TOTAL: 0.7 mg/dL (ref 0.3–1.2)
BUN: 8 mg/dL (ref 6–20)
CHLORIDE: 103 mmol/L (ref 101–111)
CO2: 27 mmol/L (ref 22–32)
Calcium: 9.2 mg/dL (ref 8.9–10.3)
Creatinine, Ser: 1.02 mg/dL (ref 0.61–1.24)
GFR calc Af Amer: >60 mL/min (ref >60)
GFR calc non Af Amer: >60 mL/min (ref >60)
GLUCOSE: 120 mg/dL — AB (ref 65–99)
Potassium: 4.4 mmol/L (ref 3.5–5.1)
Sodium: 140 mmol/L (ref 135–145)
Total Protein: 6.1 g/dL — ABNORMAL LOW (ref 6.5–8.1)

## 2015-02-27 LAB — CBC WITH DIFFERENTIAL/PLATELET
BASOS ABS: 0 10*3/uL (ref 0.0–0.1)
BASOS PCT: 0 %
Eosinophils Absolute: 0.1 10*3/uL (ref 0.0–0.7)
Eosinophils Relative: 2 %
HEMATOCRIT: 40.8 % (ref 39.0–52.0)
HEMOGLOBIN: 13.6 g/dL (ref 13.0–17.0)
LYMPHS PCT: 12 %
Lymphs Abs: 0.8 10*3/uL (ref 0.7–4.0)
MCH: 33.4 pg (ref 26.0–34.0)
MCHC: 33.3 g/dL (ref 30.0–36.0)
MCV: 100.2 fL — ABNORMAL HIGH (ref 78.0–100.0)
MONOS PCT: 7 %
Monocytes Absolute: 0.4 10*3/uL (ref 0.1–1.0)
NEUTROS ABS: 5 10*3/uL (ref 1.7–7.7)
NEUTROS PCT: 79 %
Platelets: 153 10*3/uL (ref 150–400)
RBC: 4.07 MIL/uL — ABNORMAL LOW (ref 4.22–5.81)
RDW: 12.9 % (ref 11.5–15.5)
WBC: 6.4 10*3/uL (ref 4.0–10.5)

## 2015-02-27 MED ORDER — FUROSEMIDE 80 MG PO TABS
80.0000 mg | ORAL_TABLET | Freq: Every day | ORAL | Status: DC
  Administered 2015-02-28 – 2015-03-06 (×7): 80 mg via ORAL
  Filled 2015-02-27 (×7): qty 1

## 2015-02-27 NOTE — Plan of Care (Signed)
Problem: RH PAIN MANAGEMENT Goal: RH STG PAIN MANAGED AT OR BELOW PT'S PAIN GOAL Pain level at or below 4  Outcome: Not Progressing Reports pain as 5

## 2015-02-27 NOTE — Progress Notes (Signed)
[facility]ICINE & REHABILITATION     PROGRESS NOTE  Subjective/Complaints:  Pt seen sitting in bed this AM. He slept well overnight and is ready to begin therapies today.  ROS: Left sided weakness and numbness. Denies CP, SOB, n/v/d.  Objective: Vital Signs: Blood pressure 128/89, pulse 67, temperature 98.4 F (36.9 C), temperature source Oral, resp. rate 16, weight 67.6 kg (149 lb 0.5 oz), SpO2 99 %. No results found.  Recent Labs  02/25/15 0328 02/26/15 0345  WBC 6.8 6.2  HGB 13.1 12.7*  HCT 38.5* 36.8*  PLT 136* 142*    Recent Labs  02/25/15 0328 02/26/15 0345  NA 142 138  K 3.1* 3.8  CL 110 105  GLUCOSE 85 102*  BUN 6 9  CREATININE 0.91 0.91  CALCIUM 8.5* 8.8*   CBG (last 3)  No results for input(s): GLUCAP in the last 72 hours.  Wt Readings from Last 3 Encounters:  02/27/15 67.6 kg (149 lb 0.5 oz)  02/26/15 67.631 kg (149 lb 1.6 oz)  12/06/14 68.856 kg (151 lb 12.8 oz)    Physical Exam:  BP 128/89 mmHg  Pulse 67  Temp(Src) 98.4 F (36.9 C) (Oral)  Resp 16  Wt 67.6 kg (149 lb 0.5 oz)  SpO2 99% Constitutional: He appears well-developed and well-nourished. NAD HENT: Normocephalic and atraumatic.  Eyes: EOM are normal. Right eye exhibits no discharge. Left eye exhibits no discharge.  Cardiovascular: Normal rate and regular rhythm.  Respiratory: Effort normal and breath sounds normal. No respiratory distress.  GI: Soft. Bowel sounds are normal. He exhibits no distension.  Musculoskeletal: He exhibits no edema or tenderness.  Neurological: He is alert and oriented.  Speech is a bit dysarthric but intelligible.  He follows simple commands.  Fair awareness of deficits Motor: RUE/RLE: 5/5 proximal distal LUE: 4/5 proximal distal LLE: Hip flexion, knee extension 4 -/5, ankle dorsi/plantarflexion 3+/5  Skin: Skin is warm and dry.  Psychiatric: He has a normal mood and affect. His behavior is normal  Assessment/Plan: 1. Functional  deficits secondary to right MCA infarct which require 3+ hours per day of interdisciplinary therapy in a comprehensive inpatient rehab setting. Physiatrist is providing close team supervision and 24 hour management of active medical problems listed below. Physiatrist and rehab team continue to assess barriers to discharge/monitor patient progress toward functional and medical goals.  Function:  Bathing Bathing position      Bathing parts      Bathing assist        Upper Body Dressing/Undressing Upper body dressing                    Upper body assist        Lower Body Dressing/Undressing Lower body dressing                                  Lower body assist        Toileting Toileting          Toileting assist     Transfers Chair/bed transfer             Locomotion Ambulation           Wheelchair          Cognition Comprehension    Expression    Social Interaction    Problem Solving    Memory      Medical Problem List and Plan:  1. Left-sided weakness and dysarthria secondary to right MCA infarct on 02/24/15 2. DVT Prophylaxis/Anticoagulation: Subcutaneous Lovenox. Monitor platelet counts or signs of bleeding 3. Pain Management: Tylenol as needed 4. Hypertension. Lisinopril 20 mg daily, Aldactone 12.5 mg daily. Monitor with increased mobility  Permissive HTN for time being 5. Neuropsych: This patient is capable of making decisions on his own behalf. 6. Skin/Wound Care: Routine skin checks 7. Fluids/Electrolytes/Nutrition: Routine I&O's  Labs pending 8. Alcohol/polysubstance abuse.   Provide counseling.  CIWA 9.NICM. Echocardiogram with ejection fraction of 20%. Monitor with increased mobility 10. Diastolic congestive heart failure. Weigh patient daily monitor for any signs of fluid overload. 11. ABLA  Hb 12/7 on 2/6  Labs pending  LOS (Days) 1 A FACE TO FACE EVALUATION WAS PERFORMED  Daniah Zaldivar Karis Juba 02/27/2015 8:49 AM

## 2015-02-27 NOTE — IPOC Note (Signed)
Overall Plan of Care [facility]) Patient Details Name: [name] MRN: [id number] DOB: 1965/07/01  Admitting Diagnosis: RT CVA  Hospital Problems: Active Problems:   Right middle cerebral artery stroke (HCC)   Dysarthria due to cerebrovascular accident (CVA) (HCC)   Benign essential HTN   ETOH abuse   Tobacco abuse   Cocaine abuse   NICM (nonischemic cardiomyopathy) (HCC)   Chronic combined systolic and diastolic congestive heart failure (HCC)   Acute blood loss anemia     Functional Problem List: Nursing Endurance, Medication Management, Pain, Perception, Safety, Sensory  PT Balance, Endurance, Motor, Safety  OT Balance, Cognition, Edema, Endurance, Motor, Skin Integrity, Sensory, Safety, Pain  SLP Cognition  TR         Basic ADL's: OT Grooming, Bathing, Dressing, Toileting     Advanced  ADL's: OT Simple Meal Preparation     Transfers: PT Bed Mobility, Bed to Chair, Car, Furniture, [employer] fast streamer, Tub/Shower     Locomotion: PT Stairs, Ambulation, Wheelchair Mobility     Additional Impairments: OT Fuctional Use of Upper Extremity  SLP Social Cognition   Memory, Awareness  TR      Anticipated Outcomes Item Anticipated Outcome  Self Feeding n/a  Swallowing      Basic self-care  mod I   Toileting  mod I    Bathroom Transfers mod I   Bowel/Bladder  Manage bowel and bladder Mod I  Transfers  mod I  Locomotion  mod I ambulatory with LRAD  Communication     Cognition  Mod I   Pain  3 or less  Safety/Judgment  Mod I   Therapy Plan: PT Intensity: Minimum of 1-2 x/day ,45 to 90 minutes PT Frequency: 5 out of 7 days PT Duration Estimated Length of Stay: 7-9 OT Intensity: Minimum of 1-2 x/day, 45 to 90 minutes OT Frequency: 5 out of 7 days OT Duration/Estimated Length of Stay: 10 days SLP Intensity: Minumum of 1-2 x/day, 30 to 90 minutes SLP Frequency: 3 to 5 out of 7 days SLP Duration/Estimated Length of Stay: 7-10 days        Team  Interventions: Nursing Interventions Patient/Family Education, Disease Management/Prevention, Pain Management, Medication Management  PT interventions Ambulation/gait training, DME/adaptive equipment instruction, Neuromuscular re-education, Stair training, UE/LE Strength taining/ROM, UE/LE Coordination activities, Therapeutic Activities, Wheelchair propulsion/positioning, Discharge planning, Warden/ranger, Functional mobility training, Patient/family education, Therapeutic Exercise  OT Interventions Warden/ranger, Cognitive remediation/compensation, Community reintegration, Discharge planning, DME/adaptive equipment instruction, Disease mangement/prevention, Functional mobility training, Pain management, Psychosocial support, Patient/family education, Neuromuscular re-education, Self Care/advanced ADL retraining, Therapeutic Exercise, UE/LE Coordination activities, Visual/perceptual remediation/compensation, UE/LE Strength taining/ROM, Therapeutic Activities, Skin care/wound managment  SLP Interventions Cognitive remediation/compensation, Cueing hierarchy, Functional tasks, Patient/family education, Internal/external aids, Environmental controls  TR Interventions    SW/CM Interventions Discharge Planning, Psychosocial Support, Patient/Family Education    Team Discharge Planning: Destination: PT-  ,OT- Home , SLP-Home Projected Follow-up: PT-Home health PT, OT-  Outpatient OT, SLP-Other (comment) (TBD pending progress made while inpatient ) Projected Equipment Needs: PT-To be determined, OT- To be determined, SLP-  Equipment Details: PT- , OT-  Patient/family involved in discharge planning: PT- Patient,  OT-Patient, Family member/caregiver, SLP-Patient, Family member/caregiver  MD ELOS: 7-10 days. Medical Rehab Prognosis:  Good Assessment: 50 y.o. right handed male with history of hypertension, chronic headaches,NICM, diastolic congestive heart failure, tobacco abuse and  polysubstance abuse. Presented 02/24/2015 with altered mental status as well as slurred speech and left-sided weakness. MRI of the brain showed  small acute cortical-based infarct lateral right MCA territory without hemorrhage. Alcohol level 214. Urine drug screen Positive cocaine. Echocardiogram with ejection fraction of 20% severe diffuse hypokinesis with grade 1 diastolic dysfunction. Patient did not receive TPA. Neurology consulted maintained on aspirin for CVA prophylaxis.   See Team Conference Notes for weekly updates to the plan of care

## 2015-02-27 NOTE — Evaluation (Signed)
Occupational Therapy Assessment and Plan  Patient Details  Name: [name] MRN: [id number] Date of Birth: 18-Oct-1965  OT Diagnosis: cognitive deficits and hemiplegia affecting non-dominant side Rehab Potential: Rehab Potential (ACUTE ONLY): Good ELOS: 10 days   Today's Date: 02/27/2015 OT Individual Time: 0800-0900 OT Individual Time Calculation (min): 60 min     Problem List:  Patient Active Problem List   Diagnosis Date Noted  . Acute blood loss anemia   . Right middle cerebral artery stroke ([place]) 02/26/2015  . Dysarthria due to cerebrovascular accident (CVA) ([place])   . Benign essential HTN   . ETOH abuse   . Tobacco abuse   . Cocaine abuse   . NICM (nonischemic cardiomyopathy) ([place])   . Chronic combined systolic and diastolic congestive heart failure ([place])   . Hypokalemia   . Delirium 02/24/2015  . Altered mental status 02/24/2015  . Altered mental state   . Cerebrovascular accident (CVA) ([place])   . Accelerated hypertension 12/06/2014  . 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 Nonischemic cardiomyopathy ([place]) 03/24/2014  . Acute respiratory failure with hypoxia ([place]) 03/23/2014  . 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 Chronic systolic heart failure ([place]) 03/22/2014  . Polysubstance abuse 03/22/2014    Past Medical History:  Past Medical History  Diagnosis Date  . Hypertension   . Headache   . CHF (congestive heart failure) ([place])    Past Surgical History:  Past Surgical History  Procedure Laterality Date  . No past surgeries      Assessment & Plan Clinical Impression: Patient is a 50 y.o. year old male right handed male with history of hypertension, chronic headaches,NICM, diastolic congestive heart failure, tobacco abuse and polysubstance abuse. Patient lives alone independently prior to admission. He has a fiance that can assist on discharge. Presented 02/24/2015 with altered mental status as well as slurred speech and left-sided weakness.  MRI of the brain showed small acute cortical-based infarct lateral right MCA territory without hemorrhage. Alcohol level 214. Urine drug screen Positive cocaine. Echocardiogram with ejection fraction of 20% severe diffuse hypokinesis with grade 1 diastolic dysfunction. Carotid Dopplers with no ICA stenosis. EEG consistent with encephalopathy no seizure activity. Patient did not receive TPA. Neurology consulted maintained on aspirin for CVA prophylaxis. Subcutaneous Lovenox for DVT prophylaxis. Tolerating a regular consistency diet. Patient transferred to CIR on 02/26/2015 .    Patient currently requires mod with basic self-care skills and basic mobility  secondary to muscle weakness, decreased cardiorespiratoy endurance, impaired timing and sequencing, unbalanced muscle activation and decreased coordination, decreased attention to left, decreased awareness, decreased safety awareness and decreased memory and decreased standing balance, decreased postural control, hemiplegia and decreased balance strategies.  Prior to hospitalization, patient could complete ADL with independent .  Patient will benefit from skilled intervention to decrease level of assist with basic self-care skills and increase independence with basic self-care skills prior to discharge home with care partner.  Anticipate patient will require intermittent supervision and follow up outpatient.  OT - End of Session Activity Tolerance: Decreased this session Endurance Deficit: Yes OT Assessment Rehab Potential (ACUTE ONLY): Good OT Patient demonstrates impairments in the following area(s): Balance;Cognition;Edema;Endurance;Motor;Skin Integrity;Sensory;Safety;Pain OT Basic ADL's Functional Problem(s): Grooming;Bathing;Dressing;Toileting OT Advanced ADL's Functional Problem(s): Simple Meal Preparation OT Transfers Functional Problem(s): Toilet;Tub/Shower OT Additional Impairment(s): Fuctional Use of Upper Extremity OT Plan OT Intensity:  Minimum of 1-2 x/day, 45 to 90 minutes OT Frequency: 5 out of 7 days OT Duration/Estimated Length of Stay: 10 days OT Treatment/Interventions: Balance/vestibular training;Cognitive  remediation/compensation;Community reintegration;Discharge planning;DME/adaptive equipment instruction;Disease mangement/prevention;Functional mobility training;Pain management;Psychosocial support;Patient/family education;Neuromuscular re-education;Self Care/advanced ADL retraining;Therapeutic Exercise;UE/LE Coordination activities;Visual/perceptual remediation/compensation;UE/LE Strength taining/ROM;Therapeutic Activities;Skin care/wound managment OT Self Feeding Anticipated Outcome(s): n/a OT Basic Self-Care Anticipated Outcome(s): mod I  OT Toileting Anticipated Outcome(s): mod I  OT Bathroom Transfers Anticipated Outcome(s): mod I  OT Recommendation Patient destination: Home Follow Up Recommendations: Outpatient OT Equipment Recommended: To be determined   Skilled Therapeutic Intervention OT eval initiated with OT purpose, role and goals discussed with pt and pt's fiance. Self care retraining at shower level with focus on dynamic standing balance, forced use of left UE with mod VC, functional ambulation with and without RW, activity tolerance, awareness, and pt education. Pt with limited dorsiflexion during functional ambulation - however able to demonstrate range with testing; compensation with extreme hip flexion with ambulation . Pt with difficulty with sustained hip flexion on left  during ambulation due to weakness. Pt with decr attention to left body and decr use. Left sitting EOB with fiance at end of session.    OT Evaluation Precautions/Restrictions  Precautions Precautions: Fall Restrictions Weight Bearing Restrictions: No General Chart Reviewed: Yes Family/Caregiver Present: Yes (finance) Vital Signs Therapy Vitals Temp: 98.4 F (36.9 C) Temp Source: Oral Pulse Rate: 67 Resp: 16 BP: 128/89  mmHg Patient Position (if appropriate): Lying Oxygen Therapy SpO2: 99 % O2 Device: Not Delivered Pain Pain Assessment Pain Score: 5  Pain Intervention(s): RN made aware Home Living/Prior Functioning Home Living Available Help at Discharge: Family, Available PRN/intermittently Type of Home: House (fiancee's home) Home Access: Stairs to enter Technical brewer of Steps: 3 Home Layout: Two level ADL ADL ADL Comments: see functional navigator Vision/Perception  Vision- History Baseline Vision/History: Wears glasses Wears Glasses: Reading only Patient Visual Report: No change from baseline Vision- Assessment Vision Assessment?: No apparent visual deficits  Cognition Overall Cognitive Status: Impaired/Different from baseline Arousal/Alertness: Awake/alert Orientation Level: Person;Place;Situation Person: Oriented Place: Oriented Situation: Oriented Year: 2017 Month: February Day of Week: Correct Memory: Appears intact Immediate Memory Recall: Sock;Blue;Bed Memory Recall: Sock;Blue;Bed Memory Recall Sock: Without Cue Memory Recall Blue: Without Cue Memory Recall Bed: Without Cue Attention: Focused;Sustained Focused Attention: Appears intact Sustained Attention: Appears intact Awareness: Impaired Awareness Impairment: Anticipatory impairment Behaviors: Impulsive Safety/Judgment: Impaired Sensation Sensation Light Touch: Impaired Detail Light Touch Impaired Details: Impaired LUE;Impaired LLE Stereognosis: Not tested Hot/Cold: Appears Intact Proprioception: Impaired Detail Proprioception Impaired Details: Impaired LUE;Impaired LLE Coordination Gross Motor Movements are Fluid and Coordinated: No Coordination and Movement Description: left UE slow and often requires vision for accuracy Motor  Motor Motor: Hemiplegia;Abnormal postural alignment and control Mobility  Transfers Transfers: Stand to Sit;Sit to Stand Sit to Stand: 4: Min assist Stand to Sit: 4:  Min assist  Trunk/Postural Assessment  Cervical Assessment Cervical Assessment: Within Functional Limits Thoracic Assessment Thoracic Assessment: Within Functional Limits Lumbar Assessment Lumbar Assessment: Within Functional Limits Postural Control Postural Control: Deficits on evaluation Righting Reactions: delayed Protective Responses: delayed  Balance Balance Balance Assessed: Yes Dynamic Sitting Balance Dynamic Sitting - Level of Assistance: 5: Stand by assistance Static Standing Balance Static Standing - Balance Support: During functional activity Static Standing - Level of Assistance: 4: Min assist Dynamic Standing Balance Dynamic Standing - Balance Support: During functional activity Dynamic Standing - Level of Assistance: 3: Mod assist Dynamic Standing - Comments: during ADL tasks Extremity/Trunk Assessment RUE Assessment RUE Assessment: Within Functional Limits LUE Assessment LUE Assessment: Exceptions to WFL LUE AROM (degrees) Overall AROM Left Upper Extremity: Within functional limits for tasks assessed  LUE Strength LUE Overall Strength: Deficits LUE Overall Strength Comments: 3-/4   See Function Navigator for Current Functional Status.   Refer to Care Plan for Long Term Goals  Recommendations for other services: None  Discharge Criteria: Patient will be discharged from OT if patient refuses treatment 3 consecutive times without medical reason, if treatment goals not met, if there is a change in medical status, if patient makes no progress towards goals or if patient is discharged from hospital.  The above assessment, treatment plan, treatment alternatives and goals were discussed and mutually agreed upon: by patient  Nicoletta Ba 02/27/2015, 9:19 AM

## 2015-02-27 NOTE — Evaluation (Signed)
Physical Therapy Assessment and Plan  Patient Details  Name: [name] MRN: [id number] Date of Birth: August 24, 1965  PT Diagnosis: Abnormality of gait, Coordination disorder and Hemiparesis non-dominant Rehab Potential: Good ELOS: 7-9   Today's Date: 02/27/2015 PT Individual Time: 1015-1110 PT Individual Time Calculation (min): 55 min    Problem List:  Patient Active Problem List   Diagnosis Date Noted  . Acute blood loss anemia   . Right middle cerebral artery stroke ([place]) 02/26/2015  . Dysarthria due to cerebrovascular accident (CVA) ([place])   . Benign essential HTN   . ETOH abuse   . Tobacco abuse   . Cocaine abuse   . NICM (nonischemic cardiomyopathy) ([place])   . Chronic combined systolic and diastolic congestive heart failure ([place])   . Hypokalemia   . Delirium 02/24/2015  . Altered mental status 02/24/2015  . Altered mental state   . Cerebrovascular accident (CVA) ([place])   . Accelerated hypertension 12/06/2014  . 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 Nonischemic cardiomyopathy ([place]) 03/24/2014  . Acute respiratory failure with hypoxia ([place]) 03/23/2014  . Hypertensive cardiomyopathy ([place]) 03/23/2014  . Chronic systolic heart failure ([place]) 03/22/2014  . Polysubstance abuse 03/22/2014    Past Medical History:  Past Medical History  Diagnosis Date  . Hypertension   . Headache   . CHF (congestive heart failure) ([place])    Past Surgical History:  Past Surgical History  Procedure Laterality Date  . No past surgeries      Assessment & Plan Clinical Impression: A 50 y.o. right handed male with history of hypertension, chronic headaches,NICM, diastolic congestive heart failure, tobacco abuse and polysubstance abuse. Patient lives alone independently prior to admission. He has a fiance that can assist on discharge. Presented 02/24/2015 with altered mental status as well as slurred speech and left-sided weakness. MRI of the brain showed small acute cortical-based  infarct lateral right MCA territory without hemorrhage. Alcohol level 214. Positive cocaine. Echocardiogram with ejection fraction of 20% severe diffuse hypokinesis with grade 1 diastolic dysfunction. Carotid Dopplers with no ICA stenosis. EEG consistent with encephalopathy no seizure activity. Patient did not receive TPA. Neurology consulted maintained on aspirin for CVA prophylaxis. Subcutaneous Lovenox for DVT prophylaxis. Tolerating a regular consistency diet. Physical therapy evaluation completed 02/25/2015 with recommendations of physical medicine rehabilitation consult.  Patient transferred to CIR on 02/26/2015 .   Patient currently requires min with mobility secondary to muscle weakness, decreased cardiorespiratoy endurance, impaired timing and sequencing, unbalanced muscle activation and decreased coordination and decreased sitting balance, decreased standing balance, decreased postural control and decreased balance strategies.  Prior to hospitalization, patient was independent  with mobility and lived with Alone in a House (fiancee or sister, not sure at this point) home.  Home access is 3Level entry (per pt).  Patient will benefit from skilled PT intervention to maximize safe functional mobility and minimize fall risk for planned discharge home with intermittent assist.  Anticipate patient will benefit from follow up [facility] at discharge.  PT - End of Session Activity Tolerance: Tolerates 30+ min activity with multiple rests Endurance Deficit: Yes PT Assessment Rehab Potential (ACUTE/IP ONLY): Good Barriers to Discharge: Decreased caregiver support;Inaccessible home environment Barriers to Discharge Comments: pt unsure of d/c destination, could go to his home (2 step entry, no rails, 1 story), sister's home or fiancee's home (both with no step entry and bed/bath on 2nd floor) PT Patient demonstrates impairments in the following area(s): Balance;Endurance;Motor;Safety PT Transfers Functional  Problem(s): Bed Mobility;Bed to Chair;Car;Furniture;Floor PT Locomotion  Functional Problem(s): Stairs;Ambulation;Wheelchair Mobility PT Plan PT Intensity: Minimum of 1-2 x/day ,45 to 90 minutes PT Frequency: 5 out of 7 days PT Duration Estimated Length of Stay: 7-9 PT Treatment/Interventions: Ambulation/gait training;DME/adaptive equipment instruction;Neuromuscular re-education;Stair training;UE/LE Strength taining/ROM;UE/LE Coordination activities;Therapeutic Activities;Wheelchair propulsion/positioning;Discharge planning;Balance/vestibular training;Functional mobility training;Patient/family education;Therapeutic Exercise PT Transfers Anticipated Outcome(s): mod I PT Locomotion Anticipated Outcome(s): mod I ambulatory with LRAD PT Recommendation Follow Up Recommendations: Home health PT Equipment Recommended: To be determined  Skilled Therapeutic Intervention Pt received resting in bed and agreeable to therapy session.  Skilled PT intervention began after initial assessment.  PT provided pt education on role of PT, plan of care, goals of therapy, and ELOS.  Session focused on functional transfers, ambulation, balance, endurance, and patient education.  Pt demonstrates bed mobility with supervision-mod I, transfers with steady assist-close supervision, and ambulation with steady assist.  Pt returned to room at end of session and positioned supine in bed with call bell in reach and needs met.   PT Evaluation Precautions/Restrictions Precautions Precautions: Fall Restrictions Weight Bearing Restrictions: No Pain Pain Assessment Pain Assessment: No/denies pain Pain Score: 5  Pain Intervention(s): RN made aware Home Living/Prior Functioning Home Living Available Help at Discharge: Family;Available PRN/intermittently Type of Home: House (fiancee or sister, not sure at this point) Home Access: Level entry (per pt) Entrance Stairs-Number of Steps: 3 Home Layout: Two level;Bed/bath  upstairs Alternate Level Stairs-Rails: Left  Lives With: Alone Prior Function Level of Independence: Independent with gait;Independent with transfers  Able to Take Stairs?: Yes (tried to minimize 2/2 old LLE injury) Driving: Yes Vocation: Full time employment  Cognition Overall Cognitive Status: Impaired/Different from baseline Arousal/Alertness: Awake/alert Orientation Level: Oriented X4 Attention: Focused;Sustained Focused Attention: Appears intact Sustained Attention: Appears intact Memory: Appears intact Awareness: Impaired Awareness Impairment: Anticipatory impairment Behaviors: Impulsive Safety/Judgment: Impaired Sensation Sensation Light Touch: Appears Intact (intact to LT) Light Touch Impaired Details: Impaired LUE;Impaired LLE Stereognosis: Not tested Hot/Cold: Appears Intact Proprioception: Impaired Detail Proprioception Impaired Details: Impaired LUE;Impaired LLE Coordination Gross Motor Movements are Fluid and Coordinated: No Coordination and Movement Description: LUE decreased speed compared to R Finger Nose Finger Test: LUE decreased speed Heel Shin Test: LLE decreased speed Motor  Motor Motor: Hemiparesis;Abnormal postural alignment and control  Mobility Bed Mobility Bed Mobility: Sit to Supine;Supine to Sit;Sitting - Scoot to Edge of Bed Supine to Sit: 6: Modified independent (Device/Increase time) Sitting - Scoot to Edge of Bed: 5: Supervision Sit to Supine: 5: Supervision;With rail Transfers Transfers: Yes Sit to Stand: 4: Min assist Sit to Stand Details: Verbal cues for technique;Verbal cues for safe use of DME/AE;Verbal cues for precautions/safety Stand to Sit: 4: Min assist Stand to Sit Details (indicate cue type and reason): Verbal cues for safe use of DME/AE;Verbal cues for technique;Verbal cues for precautions/safety Squat Pivot Transfers: 4: Min guard Squat Pivot Transfer Details: Verbal cues for safe use of DME/AE;Verbal cues for  technique;Verbal cues for precautions/safety Locomotion  Ambulation Ambulation: Yes Ambulation/Gait Assistance: 4: Min assist Ambulation Distance (Feet): 87 Feet Assistive device: Rolling walker Ambulation/Gait Assistance Details: Verbal cues for safe use of DME/AE;Verbal cues for technique;Verbal cues for gait pattern;Tactile cues for posture Gait Gait: Yes Gait Pattern: Step-to pattern;Left steppage;Decreased hip/knee flexion - left;Decreased dorsiflexion - left Wheelchair Mobility Wheelchair Mobility: Yes Wheelchair Assistance: 5: Supervision Wheelchair Assistance Details: Verbal cues for technique Wheelchair Propulsion: Right upper extremity;Left upper extremity;Right lower extremity Wheelchair Parts Management: Needs assistance Distance: 150  Trunk/Postural Assessment  Cervical Assessment Cervical Assessment: Within Functional Limits Thoracic  Assessment Thoracic Assessment: Within Functional Limits Lumbar Assessment Lumbar Assessment: Within Functional Limits Postural Control Postural Control: Deficits on evaluation Righting Reactions: delayed Protective Responses: delayed  Balance Balance Balance Assessed: Yes Dynamic Sitting Balance Dynamic Sitting - Balance Support: No upper extremity supported;Feet unsupported;During functional activity Dynamic Sitting - Level of Assistance: 5: Stand by assistance Sitting balance - Comments: donning/doffing socks on elevated mat Static Standing Balance Static Standing - Balance Support: No upper extremity supported Static Standing - Level of Assistance: 5: Stand by assistance Static Standing - Comment/# of Minutes: R lean, unable to correct without UE support on Rw Dynamic Standing Balance Dynamic Standing - Balance Support: Right upper extremity supported;Left upper extremity supported Dynamic Standing - Level of Assistance: 4: Min assist Dynamic Standing - Balance Activities: Reaching across midline;Lateral lean/weight  shifting;Forward lean/weight shifting Extremity Assessment  RLE Assessment RLE Assessment: Within Functional Limits RLE Strength RLE Overall Strength Comments: 5/5 throughout LLE Assessment LLE Assessment: Exceptions to [facility] LLE Strength LLE Overall Strength Comments: hip flexion 3+/5, knee extension 3/5, knee flexion 3/5, DF 3/5, PF 2/5   See Function Navigator for Current Functional Status.   Refer to Care Plan for Long Term Goals  Recommendations for other services: None  Discharge Criteria: Patient will be discharged from PT if patient refuses treatment 3 consecutive times without medical reason, if treatment goals not met, if there is a change in medical status, if patient makes no progress towards goals or if patient is discharged from hospital.  The above assessment, treatment plan, treatment alternatives and goals were discussed and mutually agreed upon: by patient  Urban Gibson E Penven-Crew 02/27/2015, 11:16 AM

## 2015-02-27 NOTE — Evaluation (Addendum)
Speech Language Pathology Assessment and Plan  Patient Details  Name: [name] MRN: [id number] Date of Birth: 21-Jul-1965  SLP Diagnosis: Cognitive Impairments  Rehab Potential: Good ELOS: 7-10 days     Today's Date: 02/27/2015 SLP Individual Time: 1638-4665 SLP Individual Time Calculation (min): 56 min   Problem List:  Patient Active Problem List   Diagnosis Date Noted  . Acute blood loss anemia   . 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 Dysarthria due to cerebrovascular accident (CVA) ([place])   . Benign essential HTN   . ETOH abuse   . Tobacco abuse   . Cocaine abuse   . NICM (nonischemic cardiomyopathy) ([place])   . Chronic combined systolic and diastolic congestive heart failure ([place])   . Hypokalemia   . Delirium 02/24/2015  . Altered mental status 02/24/2015  . Altered mental state   . Cerebrovascular accident (CVA) ([place])   . Accelerated hypertension 12/06/2014  . 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 Nonischemic cardiomyopathy ([place]) 03/24/2014  . Acute respiratory failure with hypoxia ([place]) 03/23/2014  . Hypertensive cardiomyopathy ([place]) 03/23/2014  . Chronic systolic heart failure ([place]) 03/22/2014  . Polysubstance abuse 03/22/2014   Past Medical History:  Past Medical History  Diagnosis Date  . Hypertension   . Headache   . CHF (congestive heart failure) ([place])    Past Surgical History:  Past Surgical History  Procedure Laterality Date  . No past surgeries      Assessment / Plan / Recommendation Clinical Impression   [name] is a 50 y.o. right handed male presented 02/24/2015 with altered mental status as well as slurred speech and left-sided weakness. MRI of the brain showed small acute cortical-based infarct lateral right MCA territory without hemorrhage. Alcohol level 214. Urine drug screen Positive cocaine. Patient was admitted for a comprehensive rehabilitation program on 02/25/2014.  SLP evaluation completed on 02/27/2015  with the following results:  Pt scored a 27 out of 30 on the MoCA standardized cognitive assessment (n>/= 26) although he presents with mild higher level cognitive deficits characterized by executive function, anticipatory awareness, and short term memory impairments to which test is not sensitive.  Pt reports ongoing memory deficits as well as hoarse vocal quality since recent hospitalization and subsequent intubation for CHF (~6 months ago per pt's report).  Question accuracy of pt's report given that he was also intubated during this admission for agitation, although he endorses losing his voice easily at work as a result of previous intubation and fiancee appears to be in agreement with pt's history.  While pt does exhibit left sided labial weakness and decreased facial sensation, his speech does not appear overtly dysarthric from acute CVA but rather is characterized by a lateral lisp which he has had since childhood.   Pt was independent and working full time prior to admission and the abovementioned deficits impact his ability to complete semi-complex home management and self care tasks independently.  As a result, pt would benefit from brief ST follow up while inpatient in order to maximize functional independence and reduce burden of care prior to discharge.  Discharge recommendations to be determined pending progress made while inpatient.    Skilled Therapeutic Interventions          Cognitive-linguistic evaluation completed with results and recommendations reviewed with patient and family.     SLP Assessment  Patient will need skilled Speech Lanaguage Pathology Services during CIR admission    Recommendations  Recommendations for Other Services: Neuropsych  consult Patient destination: Home Follow up Recommendations: Other (comment) (TBD pending progress made while inpatient )    SLP Frequency 3 to 5 out of 7 days   SLP Duration  SLP Intensity  SLP Treatment/Interventions 7-10 days    Minumum of 1-2 x/day, 30 to 90 minutes  Cognitive remediation/compensation;Cueing hierarchy;Functional tasks;Patient/family education;Internal/external aids;Environmental controls    Pain Pain Assessment Pain Assessment: No/denies pain  Prior Functioning Cognitive/Linguistic Baseline: Baseline deficits Baseline deficit details: pt reports ongoing memory deficits over the last 6 months  Type of Home: House  Lives With: Alone Available Help at Discharge: Family;Available 24 hours/day Education: 12th grade  Vocation: Full time employment  Function:  Eating Eating                 Cognition Comprehension Comprehension assist level: Follows basic conversation/direction with no assist  Expression   Expression assist level: Expresses basic needs/ideas: With no assist  Social Interaction Social Interaction assist level: Interacts appropriately 90% of the time - Needs monitoring or encouragement for participation or interaction.  Problem Solving Problem solving assist level: Solves basic 90% of the time/requires cueing < 10% of the time  Memory Memory assist level: Recognizes or recalls 75 - 89% of the time/requires cueing 10 - 24% of the time   Short Term Goals: Week 1: SLP Short Term Goal 1 (Week 1): Pt will complete semi-complex medication management tasks for >80% accuracy with mod I  SLP Short Term Goal 2 (Week 1): Pt will complete familiar money management tasks for >80% accuracy with mod I.  SLP Short Term Goal 3 (Week 1): Pt will recall complex, daily information with supervision cues for >80% accuracy.    Refer to Care Plan for Long Term Goals  Recommendations for other services: Neuropsych  Discharge Criteria: Patient will be discharged from SLP if patient refuses treatment 3 consecutive times without medical reason, if treatment goals not met, if there is a change in medical status, if patient makes no progress towards goals or if patient is discharged from  hospital.  The above assessment, treatment plan, treatment alternatives and goals were discussed and mutually agreed upon: by patient and by family  Emilio Math 02/27/2015, 3:55 PM

## 2015-02-27 NOTE — Progress Notes (Signed)
Occupational Therapy Note  Patient Details  Name: [name] MRN: [id number] Date of Birth: 06-12-1965  Today's Date: 02/27/2015 OT Individual Time: 1300-1330 OT Individual Time Calculation (min): 30 min   Pt denied pain Individual Therapy  Pt resting in w/c upon arrival with girlfriend present.  Pt hesitant to stand/walk without use of RW.  Pt initially engaged in LUE/LLE NMR including quadraped while reaching with LUE for cups and stacking them on bench.  Pt transitioned to standing tasks with focus on weight shifts to LLE while reaching to left with LUE outside BOS.  Pt also administered 9 hole peg R=16.67 seconds, L=29.94 seconds.  Pt returned to room and remained in w/c with girl friend present.    Lavone Neri [facility] 02/27/2015, 2:27 PM

## 2015-02-28 ENCOUNTER — Inpatient Hospital Stay (HOSPITAL_COMMUNITY): Payer: Medicaid Other | Admitting: Occupational Therapy

## 2015-02-28 ENCOUNTER — Inpatient Hospital Stay (HOSPITAL_COMMUNITY): Payer: Medicaid Other | Admitting: Physical Therapy

## 2015-02-28 ENCOUNTER — Inpatient Hospital Stay (HOSPITAL_COMMUNITY): Payer: Medicaid Other | Admitting: Speech Pathology

## 2015-02-28 MED ORDER — LISINOPRIL 5 MG PO TABS
5.0000 mg | ORAL_TABLET | Freq: Every day | ORAL | Status: DC
  Administered 2015-02-28 – 2015-03-06 (×7): 5 mg via ORAL
  Filled 2015-02-28 (×7): qty 1

## 2015-02-28 NOTE — Patient Care Conference (Signed)
Inpatient RehabilitationTeam Conference and Plan of Care Update Date: 02/28/2015   Time: 2:40 PM    Patient Name: [name]      Medical Record Number: [id number]  Date of Birth: [birth year] Sex: Male         Room/Bed: 4W22C/4W22C-01 Payor Info: Payor: MEDICAID POTENTIAL / Plan: MEDICAID POTENTIAL / Product Type: *No Product type* /    Admitting Diagnosis: RT CVA  Admit Date/Time:  02/26/2015  5:20 PM Admission Comments: No comment available   Primary Diagnosis:  <principal problem not specified> Principal Problem: <principal problem not specified>  Patient Active Problem List   Diagnosis Date Noted  . Acute blood loss anemia   . 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 Dysarthria due to cerebrovascular accident (CVA) ([facility])   . Benign essential HTN   . ETOH abuse   . Tobacco abuse   . Cocaine abuse   . NICM (nonischemic cardiomyopathy) ([facility])   . Chronic combined systolic and diastolic congestive heart failure ([facility])   . Hypokalemia   . Delirium 02/24/2015  . Altered mental status 02/24/2015  . Altered mental state   . Cerebrovascular accident (CVA) ([facility])   . Accelerated hypertension 12/06/2014  . Alcoholic cardiomyopathy ([facility]) 63/33/5456  . Nonischemic cardiomyopathy ([facility]) 03/24/2014  . Acute respiratory failure with hypoxia ([facility]) 03/23/2014  . Hypertensive cardiomyopathy ([facility]) 03/23/2014  . Chronic systolic heart failure ([facility]) 03/22/2014  . Polysubstance abuse 03/22/2014    Expected Discharge Date: Expected Discharge Date: 03/06/15  Team Members Present: Physician leading conference: Dr. Maryla Morrow Social Worker Present: Amada Jupiter, LCSW Nurse Present: Chana Bode, RN PT Present: Teodoro Kil, PT OT Present: Callie Fielding, OT SLP Present: Feliberto Gottron, SLP PPS Coordinator present : Tora Duck, RN, CRRN     Current Status/Progress Goal Weekly Team Focus  Medical   Left-sided weakness and dysarthria secondary to right MCA infarct on  02/24/15  Safety awareness, improve gait, adjustment of BP meds  see above   Bowel/Bladder   Continent of bowel and bladder LBM 2/7  Continue      Swallow/Nutrition/ Hydration             ADL's   min -mod A for standing balance during ADL, supervision to min A for LB dressing.  Limited dressing due to only having underwear and tank top.  mod I overall  family education, d/c planning, dynamic standing balance, functional ambulation, left UE coordination and strengthening   Mobility   min assist overall  mod I with RW  safety awareness, gait training with quad cane, possible orthotics consult, strengthening, balance, coordination    Communication             Safety/Cognition/ Behavioral Observations  Supervision-Min A  Supervision-Mod I  recall/carryover of information, complex problem solving    Pain   H/A in morning, Tylenol 650 mg q 4 hrs PRN  Treat pain with Tylenol and assess effectiveness of pain medication  Continue to assess pain before therapy sessions and PRN   Skin   No skin issue noted  Continue to assess skin q shift and PRN  Encourage turning q 2 hrs and PRN for pressure relief    Rehab Goals Patient on target to meet rehab goals: Yes *See Care Plan and progress notes for long and short-term goals.  Barriers to Discharge: BP meds, safety awareness    Possible Resolutions to Barriers:  Adjustment of BP meds, pt and family edu    Discharge Planning/Teaching Needs:  Home with fiance who can provide intermittent assitance.  Education to be scheduled.   Team Discussion:  Overall goals being set for mod Ind.  Determining which d/c location to plan for.  Currently supervision with mobility.  Going well with cognition including med and money management skills.  Team attempting to continue to educate pt on healthy habits going forward.  Revisions to Treatment Plan:  None   Continued Need for Acute Rehabilitation Level of Care: The patient requires daily medical management  by a physician with specialized training in physical medicine and rehabilitation for the following conditions: Daily direction of a multidisciplinary physical rehabilitation program to ensure safe treatment while eliciting the highest outcome that is of practical value to the patient.: Yes Daily medical management of patient stability for increased activity during participation in an intensive rehabilitation regime.: Yes Daily analysis of laboratory values and/or radiology reports with any subsequent need for medication adjustment of medical intervention for : Cardiac problems;Neurological problems;Blood pressure problems  [name] 03/01/2015, 3:05 PM

## 2015-02-28 NOTE — Progress Notes (Signed)
Speech Language Pathology Daily Session Note  Patient Details  Name: [name] MRN: [id number] Date of Birth: 1965/10/14  Today's Date: 02/28/2015 SLP Individual Time: 0900-1000 SLP Individual Time Calculation (min): 60 min  Short Term Goals: Week 1: SLP Short Term Goal 1 (Week 1): Pt will complete semi-complex medication management tasks for >80% accuracy with mod I  SLP Short Term Goal 2 (Week 1): Pt will complete familiar money management tasks for >80% accuracy with mod I.  SLP Short Term Goal 3 (Week 1): Pt will recall complex, daily information with supervision cues for >80% accuracy.    Skilled Therapeutic Interventions: Skilled treatment session focused on cognitive goals. SLP facilitated session by providing supervision verbal cues for functional problem solving with money management task and for organization of a 2 time per day pill box with current medications.  Patient recalled ~40% of his current medications and reported, "I don't worry about it, that is why she is here" while talking about his fianc. Patient utilized the urinal with success with supervision and was transferred to the wheelchair at end of session with supervision. Patient left upright with all needs within reach. Continue with current plan of care.    Function:  Cognition Comprehension Comprehension assist level: Follows basic conversation/direction with no assist  Expression   Expression assist level: Expresses basic needs/ideas: With no assist  Social Interaction Social Interaction assist level: Interacts appropriately 90% of the time - Needs monitoring or encouragement for participation or interaction.  Problem Solving Problem solving assist level: Solves complex 90% of the time/cues < 10% of the time  Memory Memory assist level: Recognizes or recalls 75 - 89% of the time/requires cueing 10 - 24% of the time    Pain Pain Assessment Pain Assessment: 0-10 Pain Score: 3  Pain Type: Acute pain Pain  Location: Head Pain Descriptors / Indicators: Headache Pain Onset: Gradual Patients Stated Pain Goal: 2 Pain Intervention(s): Medication (See eMAR)  Therapy/Group: Individual Therapy  Topanga Alvelo 02/28/2015, 12:15 PM

## 2015-02-28 NOTE — Progress Notes (Signed)
Occupational Therapy Session Note  Patient Details  Name: [name] MRN: [id number] Date of Birth: [birth year]  Today's Date: 02/28/2015 OT Individual Time: 1287-8676 OT Individual Time Calculation (min): 69 min    Short Term Goals: Week 1:  OT Short Term Goal 1 (Week 1): STG=LTG  Skilled Therapeutic Interventions/Progress Updates:  Upon entering the room, pt seated in wheelchair awaiting therapist with no c/o pain. Pt ambulating 150' with RW to ADL apartment with steady assist for safety. Once in apartment, pt taking seated rest break. OT educated and demonstrated use of TTB in tub/shower combo. Pt returned demonstration with use of RW with steady assistance and min verbal cues for technique. OT recommended pt purchase safety treads in order to decrease fall risk with bathing. Pt ambulated from ADL apartment to day room in same manner as above. Pt seated on edge of mat for 5 reps of stand<>controlled sit with use of B LEs only for task and steady assistance.  Pt with posterior lean in standing. OT educating pt on stroke risk factors, lifestyle changes, and secondary stroke facts. Pt verbalizing understanding and asking questions as needed. Pt reporting urgency to use bathroom. Pt ambulating on carpeted surface with steady assist and RW to bathroom. Pt standing and performing clothing management and hygiene with steady assist. Pt returning to room as end of session and seated in wheelchair with call bell and all needed items within reach upon exiting the room.  Therapy Documentation Precautions:  Precautions Precautions: Fall Restrictions Weight Bearing Restrictions: No Vital Signs: Therapy Vitals BP: 140/78 mmHg Pain: Pain Assessment Pain Assessment: 0-10 Pain Score: 3  Pain Type: Acute pain Pain Location: Head Pain Descriptors / Indicators: Headache Pain Onset: Gradual Patients Stated Pain Goal: 2 Pain Intervention(s): Medication (See eMAR) ADL: ADL ADL Comments: see  functional navigator  See Function Navigator for Current Functional Status.   Therapy/Group: Individual Therapy  Lowella Grip 02/28/2015, 12:07 PM

## 2015-02-28 NOTE — Progress Notes (Signed)
Physical Therapy Session Note  Patient Details  Name: [name] MRN: [id number] Date of Birth: 1965/08/02  Today's Date: 02/28/2015 PT Individual Time: 1400-1430 and 6168-3729 PT Individual Time Calculation (min): 30 min and 29 min   Short Term Goals: Week 1:  PT Short Term Goal 1 (Week 1): =LTGs due to ELOS  Skilled Therapeutic Interventions/Progress Updates:    Session 1: Pt received resting in w/c with fiancee present and agreeable to therapy session.  Session focus on NMR and gait training with LRAD.  Pt amb x150' to therapy gym with RW and close supervision with intermittent steady assist.  Pt performed Kinetron at 35 cm/s in seated position x3 minutes for reciprocal movement/stepping pattern and strengthening.  Pt transitioned to therapy mat with steady assist and quad cane.  Trial of PLS orthotic with heel wedge on LLE for improved gait pattern. Pt amb x50' with quad cane and min assist with noted increased R lateral lean despite verbal cues for L weight shift.  Pt returned to room amb with RW x100' and close supervision with intermittent steady assist for balance and positioned in w/c with call bell in reach and needs met.   Session 2: Pt received resting in w/c and agreeable to therapy session.  Pt reports no discomfort with PLS donned.  Session focus on gait training with PLS and quad cane with mirror for visual feedback.  Pt amb 3x50' with quad cane using mirror 50% of the time for visual feedback for midline.  Pt demonstrates carryover without mirror during amb as well.  Steady assist fade to close supervision with quad cane.  Pt reports following gait training that he continues to feel more comfortable using RW at d/c.  Pt returned to room in w/c at end of session and positioned with call bell in reach and needs met.   Therapy Documentation Precautions:  Precautions Precautions: Fall Restrictions Weight Bearing Restrictions: No Pain: Pain Assessment Pain Assessment:  No/denies pain   See Function Navigator for Current Functional Status.   Therapy/Group: Individual Therapy  Earnest Conroy Penven-Crew 02/28/2015, 4:40 PM

## 2015-02-28 NOTE — Progress Notes (Signed)
[place] PHYSICAL MEDICINE & REHABILITATION     PROGRESS NOTE  Subjective/Complaints:  Pt laying comfortably in bed this AM.  He states he has a headache and has had this before after he was started on his heart failure meds.  He would like to stop his heart failure meds.  He would also like his IV d/ced.  ROS: Left sided weakness and numbness,  headache. Denies CP, SOB, n/v/d.  Objective: Vital Signs: Blood pressure 138/87, pulse 64, temperature 97.8 F (36.6 C), temperature source Oral, resp. rate 18, weight 67.2 kg (148 lb 2.4 oz), SpO2 100 %. No results found.  Recent Labs  02/26/15 0345 02/27/15 0846  WBC 6.2 6.4  HGB 12.7* 13.6  HCT 36.8* 40.8  PLT 142* 153    Recent Labs  02/26/15 0345 02/27/15 0846  NA 138 140  K 3.8 4.4  CL 105 103  GLUCOSE 102* 120*  BUN 9 8  CREATININE 0.91 1.02  CALCIUM 8.8* 9.2   CBG (last 3)  No results for input(s): GLUCAP in the last 72 hours.  Wt Readings from Last 3 Encounters:  02/28/15 67.2 kg (148 lb 2.4 oz)  02/26/15 67.631 kg (149 lb 1.6 oz)  12/06/14 68.856 kg (151 lb 12.8 oz)    Physical Exam:  BP 138/87 mmHg  Pulse 64  Temp(Src) 97.8 F (36.6 C) (Oral)  Resp 18  Wt 67.2 kg (148 lb 2.4 oz)  SpO2 100% Constitutional: He appears well-developed and well-nourished. NAD HENT: Normocephalic and atraumatic.  Eyes: EOM are normal. Right eye exhibits no discharge. Left eye exhibits no discharge.  Cardiovascular: Normal rate and regular rhythm.  Respiratory: Effort normal and breath sounds normal. No respiratory distress.  GI: Soft. Bowel sounds are normal. He exhibits no distension.  Musculoskeletal: He exhibits no edema or tenderness.  Neurological: He is alert and oriented.  Speech is a bit dysarthric but intelligible.  He follows simple commands.  Fair awareness of deficits Motor: RUE/RLE: 5/5 proximal distal LUE: 4/5 proximal distal LLE: Hip flexion, knee extension 4 -/5, ankle dorsi/plantarflexion 3+/5   Skin: Skin is warm and dry.  Psychiatric: He has a normal mood and affect. His behavior is normal  Assessment/Plan: 1. Functional deficits secondary to right MCA infarct which require 3+ hours per day of interdisciplinary therapy in a comprehensive inpatient rehab setting. Physiatrist is providing close team supervision and 24 hour management of active medical problems listed below. Physiatrist and rehab team continue to assess barriers to discharge/monitor patient progress toward functional and medical goals.  Function:  Bathing Bathing position   Position: Shower  Bathing parts Body parts bathed by patient: Right arm, Left arm, Left upper leg, Right lower leg, Left lower leg, Abdomen, Chest, Front perineal area, Buttocks, Right upper leg Body parts bathed by helper: Back  Bathing assist Assist Level: Touching or steadying assistance(Pt > 75%)      Upper Body Dressing/Undressing Upper body dressing   What is the patient wearing?: Pull over shirt/dress     Pull over shirt/dress - Perfomed by patient: Thread/unthread right sleeve, Thread/unthread left sleeve, Put head through opening, Pull shirt over trunk          Upper body assist Assist Level: Supervision or verbal cues      Lower Body Dressing/Undressing Lower body dressing   What is the patient wearing?: Underwear, Socks Underwear - Performed by patient: Thread/unthread right underwear leg, Thread/unthread left underwear leg, Pull underwear up/down  Socks - Performed by patient: Don/doff right sock, Don/doff left sock                Lower body assist Assist for lower body dressing: Touching or steadying assistance (Pt > 75%)      Toileting Toileting   Toileting steps completed by patient: Adjust clothing prior to toileting, Performs perineal hygiene, Adjust clothing after toileting      Toileting assist Assist level: Touching or steadying assistance (Pt.75%)   Transfers Chair/bed transfer    Chair/bed transfer method: Squat pivot, Stand pivot Chair/bed transfer assist level: Touching or steadying assistance (Pt > 75%) Chair/bed transfer assistive device: Armrests, Patent attorney     Max distance: 87 Assist level: Touching or steadying assistance (Pt > 75%)   Wheelchair   Type: Manual Max wheelchair distance: 150 Assist Level: Supervision or verbal cues  Cognition Comprehension Comprehension assist level: Follows basic conversation/direction with no assist  Expression Expression assist level: Expresses basic needs/ideas: With no assist  Social Interaction Social Interaction assist level: Interacts appropriately 90% of the time - Needs monitoring or encouragement for participation or interaction.  Problem Solving Problem solving assist level: Solves basic 90% of the time/requires cueing < 10% of the time  Memory Memory assist level: Recognizes or recalls 75 - 89% of the time/requires cueing 10 - 24% of the time    Medical Problem List and Plan: 1. Left-sided weakness and dysarthria secondary to right MCA infarct on 02/24/15  Cont CIR 2. DVT Prophylaxis/Anticoagulation: Subcutaneous Lovenox. Monitor platelet counts or signs of bleeding 3. Pain Management: Tylenol as needed 4. Hypertension. Aldactone 12.5 mg daily. Monitor with increased mobility  Lisinopril decreased to  to allow for permissive HTN for time being  Will also consider adding BB in future, if HR allows 5. Neuropsych: This patient is capable of making decisions on his own behalf. 6. Skin/Wound Care: Routine skin checks 7. Fluids/Electrolytes/Nutrition: Routine I&O's  BMP within relatively acceptable range 8. Alcohol/polysubstance abuse.   Provide counseling  CIWA 9.NICM. Echocardiogram with ejection fraction of 20%. Monitor with increased mobility 10. Diastolic congestive heart failure. Weigh patient daily monitor for any signs of fluid overload. 11. ABLA: Resolved  Hb 13.6 on  2/7  LOS (Days) 2 A FACE TO FACE EVALUATION WAS PERFORMED  Izick Gasbarro Karis Juba 02/28/2015 8:48 AM

## 2015-02-28 NOTE — Progress Notes (Signed)
Social Work Patient ID: [name], male   DOB: Jun 16, 1965, 50 y.o.   MRN: [id number]   Met with pt and fiance following team conference.  Both aware and agreeable with targeted d/c date of 2/14 and mod i goals.  No concerns.  Will continue to follow.  Kasyn Rolph, LCSW

## 2015-03-01 ENCOUNTER — Inpatient Hospital Stay (HOSPITAL_COMMUNITY): Payer: Medicaid Other

## 2015-03-01 ENCOUNTER — Inpatient Hospital Stay (HOSPITAL_COMMUNITY): Payer: Medicaid Other | Admitting: Speech Pathology

## 2015-03-01 ENCOUNTER — Inpatient Hospital Stay (HOSPITAL_COMMUNITY): Payer: Medicaid Other | Admitting: Physical Therapy

## 2015-03-01 ENCOUNTER — Ambulatory Visit (HOSPITAL_COMMUNITY): Payer: Self-pay | Admitting: Occupational Therapy

## 2015-03-01 NOTE — Progress Notes (Signed)
[place] PHYSICAL MEDICINE & REHABILITATION     PROGRESS NOTE  Subjective/Complaints:  Pt seen laying in bed this AM.  He slept well overnight.  He notes improvement in his strength, particularly his LUE.    ROS: Left sided weakness and numbness. Denies CP, SOB, n/v/d.  Objective: Vital Signs: Blood pressure 146/80, pulse 68, temperature 98.4 F (36.9 C), temperature source Oral, resp. rate 16, weight 68.4 kg (150 lb 12.7 oz), SpO2 99 %. No results found.  Recent Labs  02/27/15 0846  WBC 6.4  HGB 13.6  HCT 40.8  PLT 153    Recent Labs  02/27/15 0846  NA 140  K 4.4  CL 103  GLUCOSE 120*  BUN 8  CREATININE 1.02  CALCIUM 9.2   CBG (last 3)  No results for input(s): GLUCAP in the last 72 hours.  Wt Readings from Last 3 Encounters:  03/01/15 68.4 kg (150 lb 12.7 oz)  02/26/15 67.631 kg (149 lb 1.6 oz)  12/06/14 68.856 kg (151 lb 12.8 oz)    Physical Exam:  BP 146/80 mmHg  Pulse 68  Temp(Src) 98.4 F (36.9 C) (Oral)  Resp 16  Wt 68.4 kg (150 lb 12.7 oz)  SpO2 99% Constitutional: He appears well-developed and well-nourished. NAD HENT: Normocephalic and atraumatic.  Eyes: EOM are normal. Right eye exhibits no discharge. Left eye exhibits no discharge.  Cardiovascular: Normal rate and regular rhythm.  Respiratory: Effort normal and breath sounds normal. No respiratory distress.  GI: Soft. Bowel sounds are normal. He exhibits no distension.  Musculoskeletal: He exhibits no edema or tenderness.  Neurological: He is alert and oriented.  Speech is a bit dysarthric but intelligible.  He follows simple commands.  Fair awareness of deficits Motor: RUE/RLE: 5/5 proximal distal LUE: 4+/5 proximal distal LLE: Hip flexion, knee extension 4-/5, ankle dorsi/plantarflexion 3+/5  Skin: Skin is warm and dry.  Psychiatric: He has a normal mood and affect. His behavior is normal  Assessment/Plan: 1. Functional deficits secondary to right MCA infarct which require 3+  hours per day of interdisciplinary therapy in a comprehensive inpatient rehab setting. Physiatrist is providing close team supervision and 24 hour management of active medical problems listed below. Physiatrist and rehab team continue to assess barriers to discharge/monitor patient progress toward functional and medical goals.  Function:  Bathing Bathing position   Position: Shower  Bathing parts Body parts bathed by patient: Right arm, Left arm, Left upper leg, Right lower leg, Left lower leg, Abdomen, Chest, Front perineal area, Buttocks, Right upper leg Body parts bathed by helper: Back  Bathing assist Assist Level: Touching or steadying assistance(Pt > 75%)      Upper Body Dressing/Undressing Upper body dressing   What is the patient wearing?: Pull over shirt/dress     Pull over shirt/dress - Perfomed by patient: Thread/unthread right sleeve, Thread/unthread left sleeve, Put head through opening, Pull shirt over trunk          Upper body assist Assist Level: Supervision or verbal cues      Lower Body Dressing/Undressing Lower body dressing   What is the patient wearing?: Underwear, Socks Underwear - Performed by patient: Thread/unthread right underwear leg, Thread/unthread left underwear leg, Pull underwear up/down           Socks - Performed by patient: Don/doff right sock, Don/doff left sock                Lower body assist Assist for lower body dressing: Touching or steadying assistance (  Pt > 75%)      Toileting Toileting   Toileting steps completed by patient: Adjust clothing prior to toileting, Performs perineal hygiene, Adjust clothing after toileting      Toileting assist Assist level: Touching or steadying assistance (Pt.75%)   Transfers Chair/bed transfer   Chair/bed transfer method: Ambulatory Chair/bed transfer assist level: Touching or steadying assistance (Pt > 75%) Chair/bed transfer assistive device: Armrests, Cane, Scientific laboratory technician     Max distance: 150  (150 with quad cane, 150 with RW) Assist level: Touching or steadying assistance (Pt > 75%)   Wheelchair   Type: Manual Max wheelchair distance: 150 Assist Level: No help, No cues, assistive device, takes more than reasonable amount of time  Cognition Comprehension Comprehension assist level: Follows basic conversation/direction with no assist  Expression Expression assist level: Expresses basic needs/ideas: With no assist  Social Interaction Social Interaction assist level: Interacts appropriately 90% of the time - Needs monitoring or encouragement for participation or interaction.  Problem Solving Problem solving assist level: Solves complex 90% of the time/cues < 10% of the time  Memory Memory assist level: Recognizes or recalls 75 - 89% of the time/requires cueing 10 - 24% of the time    Medical Problem List and Plan: 1. Left-sided weakness and dysarthria secondary to right MCA infarct on 02/24/15  Cont CIR 2. DVT Prophylaxis/Anticoagulation: Subcutaneous Lovenox. Monitor platelet counts or signs of bleeding 3. Pain Management: Tylenol as needed 4. Hypertension. Aldactone 12.5 mg daily. Monitor with increased mobility  Lisinopril decreased to  to allow for permissive HTN for time being on 2/8  Will also consider adding BB in future, if HR allows 5. Neuropsych: This patient is capable of making decisions on his own behalf. 6. Skin/Wound Care: Routine skin checks 7. Fluids/Electrolytes/Nutrition: Routine I&O's  BMP within relatively acceptable range 8. Alcohol/polysubstance abuse.   Provide counseling  CIWA 9.NICM. Echocardiogram with ejection fraction of 20%. Monitor with increased mobility 10. Diastolic congestive heart failure. Weigh patient daily monitor for any signs of fluid overload. 11. ABLA: Resolved  Hb 13.6 on 2/7  LOS (Days) 3 A FACE TO FACE EVALUATION WAS PERFORMED  Ankit Karis Juba 03/01/2015 8:12 AM

## 2015-03-01 NOTE — Progress Notes (Signed)
Occupational Therapy Session Note  Patient Details  Name: [name] MRN: [id number] Date of Birth: [birth year]  Today's Date: 03/01/2015 OT Individual Time: 1045-1200 OT Individual Time Calculation (min): 75 min    Short Term Goals: Week 1:  OT Short Term Goal 1 (Week 1): STG=LTG  Skilled Therapeutic Interventions/Progress Updates:    Pt initially engaged in BADL retraining including bathing at shower level and dressing with sit<>stand from seat.  Pt completed all BADLs, including donning AFO, at supervision level. Pt requires more than a reasonable amount of time to complete tasks and commented that a shower was a tiring activity.  Pt amb with RW to therapy gym and engaged in dynamic standing tasks to include: semi squats to retrieve items and place on floor, retrieve items from floor, hang items on rail at elevated surface, toss weighted ball against bounce back on foam mat, and participate in zoom ball with another patient.  Pt returned to room and remained in w/c with all needs within reach. Focus on activity tolerance, sit<>stand, standing balance, functional amb with RW, and safety awareness to increase independence with BADLs.  Therapy Documentation Precautions:  Precautions Precautions: Fall Restrictions Weight Bearing Restrictions: No  Pain: Pain Assessment Pain Assessment: 0-10 Pain Score: 5  Pain Type: Acute pain Pain Location: Head Pain Orientation: Anterior Pain Descriptors / Indicators: Headache Pain Onset: On-going Patients Stated Pain Goal: 2 Pain Intervention(s): Other (Comment) (pre-medicated) Multiple Pain Sites: No  See Function Navigator for Current Functional Status.   Therapy/Group: Individual Therapy  [name] 03/01/2015, 12:21 PM

## 2015-03-01 NOTE — Progress Notes (Signed)
Social Work Assessment and Plan Social Work Assessment and Plan  Patient Details  Name: [name] MRN: [id number] Date of Birth: 1965/05/25  Today's Date: 03/01/2015  Problem List:  Patient Active Problem List   Diagnosis Date Noted  . Acute blood loss anemia   . 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 Dysarthria due to cerebrovascular accident (CVA) (HCC)   . Benign essential HTN   . ETOH abuse   . Tobacco abuse   . Cocaine abuse   . NICM (nonischemic cardiomyopathy) (HCC)   . Chronic combined systolic and diastolic congestive heart failure (HCC)   . Hypokalemia   . Delirium 02/24/2015  . Altered mental status 02/24/2015  . Altered mental state   . Cerebrovascular accident (CVA) (HCC)   . Accelerated hypertension 12/06/2014  . 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 Nonischemic cardiomyopathy (HCC) 03/24/2014  . Acute respiratory failure with hypoxia (HCC) 03/23/2014  . Hypertensive cardiomyopathy (HCC) 03/23/2014  . Chronic systolic heart failure (HCC) 03/22/2014  . Polysubstance abuse 03/22/2014   Past Medical History:  Past Medical History  Diagnosis Date  . Hypertension   . Headache   . CHF (congestive heart failure) (HCC)    Past Surgical History:  Past Surgical History  Procedure Laterality Date  . No past surgeries     Social History:  reports that he has been smoking Cigarettes.  He has a 5 pack-year smoking history. He does not have any smokeless tobacco history on file. He reports that he drinks alcohol. He reports that he uses illicit drugs (Cocaine).  Family / Support Systems Marital Status: Single Patient Roles: Partner, Parent Spouse/Significant Other: fiance, Daneen Schick @ (C) [phone] Children: pt has one son, Tadd Holtmeyer who lives locally - good support. Other Supports: mother, Margaretha Glassing neal [facility]) and god-mother, Bayard Hugger @ [phone] Anticipated Caregiver: self and fiance Ability/Limitations  of Caregiver: Foye Clock is currently not working, but may be working in the next 1-2 weeks Caregiver Availability: Intermittent Family Dynamics: Pt describes good support from fiance and family - no concerns.  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 Legal Hisotry/Current Legal Issues: None current. Guardian/Conservator: None - per MD, pt capable of making decisions on his own behalf.   Abuse/Neglect Physical Abuse: Denies Verbal Abuse: Denies Sexual Abuse: Denies Exploitation of patient/patient's resources: Denies Self-Neglect: Denies  Emotional Status Pt's affect, behavior adn adjustment status: Pt pleasant, talkative and able to complete assessment interview without difficulty. Denies any s/s of emotional distress.  Denies s/s of depression or anxiety.  Will monitor and refer for neuropsychology support as indicated.   Recent Psychosocial Issues: None Pyschiatric History: None Substance Abuse History: Pt admits to h/o substance abuse when questioned.  Downplays any current use.  He is aware that he was positive for ETOH and cocaine on admit.  Denies any need for counseling.  Patient / Family Perceptions, Expectations & Goals Pt/Family understanding of illness & functional limitations: Pt and fiance with basic understanding of his CVA and relation to BP issues.  Stressed that drug use is also a factor and pt nods head in agreement.  Good understanding of current functional limitations/ need for CIR. Premorbid pt/family roles/activities: Pt working and completely independent. Anticipated changes in roles/activities/participation: Has mod i goals.  Fiance/ family  may need to provide general assist with home management,  transportation. 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) Premorbid Home Care/DME Agencies: None Transportation available at discharge: yes Resource referrals recommended: Neuropsychology, Support group (specify)  Discharge Planning Living Arrangements: Alone Support Systems: Spouse/significant other, Children, Parent, Other relatives, Friends/neighbors Type of Residence: Private residence Insurance Resources: Customer service manager Resources: Employment Financial Screen Referred: Previously completed Living Expenses: Psychologist, sport and exercise Management: Patient Does the patient have any problems obtaining your medications?: No (followed at [facility] and [employer] MD and med coverage) Home Management: pt Patient/Family Preliminary Plans: Pt likely to d/c hom wtih fiance Social Work Anticipated Follow Up Needs: HH/OP Expected length of stay: 7-9 days  Clinical Impression Pleasant gentleman here following a CVA.  Able to correlate CVA cause to HTN and admitted drug use (however, he minimizes this as a problem).  Good support from fiance and family but can only offer intermittent assist.  Goals for mod i and determining which home he will d/c to.  No s/s of any emotional distress.  Will follow for support and d/c planning needs.  Orrin Yurkovich 03/01/2015, 3:39 PM

## 2015-03-01 NOTE — Progress Notes (Signed)
Speech Language Pathology Daily Session Note  Patient Details  Name: [name] MRN: [id number] Date of Birth: 1965/11/21  Today's Date: 03/01/2015 SLP Individual Time: 0905-1000 SLP Individual Time Calculation (min): 55 min  Short Term Goals: Week 1: SLP Short Term Goal 1 (Week 1): Pt will complete semi-complex medication management tasks for >80% accuracy with mod I  SLP Short Term Goal 2 (Week 1): Pt will complete familiar money management tasks for >80% accuracy with mod I.  SLP Short Term Goal 3 (Week 1): Pt will recall complex, daily information with supervision cues for >80% accuracy.    Skilled Therapeutic Interventions: Skilled treatment session focused on cognitive goals. SLP facilitated session by initially providing Min A question cues for problem solving during a mildly complex, new learning task, however, patient was Mod I by end of session. Patient recalled rules to task with Mod I and utilized his LUE throughout session with supervision verbal cues. Patient requested to use the bathroom and used the urinal with supervision. Patient left upright in wheelchair with all needs within reach. Continue with current plan of care.   Function:  Cognition Comprehension Comprehension assist level: Follows basic conversation/direction with no assist  Expression   Expression assist level: Expresses basic needs/ideas: With no assist  Social Interaction Social Interaction assist level: Interacts appropriately 90% of the time - Needs monitoring or encouragement for participation or interaction.  Problem Solving Problem solving assist level: Solves complex 90% of the time/cues < 10% of the time  Memory Memory assist level: Recognizes or recalls 90% of the time/requires cueing < 10% of the time    Pain Pain Assessment Pain Assessment: No/denies pain  Therapy/Group: Individual Therapy  Jenesa Foresta 03/01/2015, 3:46 PM

## 2015-03-01 NOTE — Progress Notes (Signed)
Physical Therapy Session Note  Patient Details  Name: [name] MRN: [id number] Date of Birth: Feb 10, 1965  Today's Date: 03/01/2015 PT Individual Time: 2060-1561 PT Individual Time Calculation (min): 57 min   Short Term Goals: Week 1:  PT Short Term Goal 1 (Week 1): =LTGs due to ELOS  Skilled Therapeutic Interventions/Progress Updates:    Pt received resting in w/c, reporting fatigue but agreeable to therapy session.  Session focus on balance retraining, NMR, strengthening, transfers, gait training, and equal weight bearing through LEs.  Sit>stand from w/c with RW and supervision.  Gait x150' with RW and supervision with verbal cues for upright posture and step length.  Nustep at level 4 x10 minutes with verbal cues to increase steps per minute (pt remained at 25-35 steps per minute) for NMR, reciprocal movement, and overall endurance.  PT administered BERG, and patient demonstrates significant fall risk as noted by score of   38/56 on Berg Balance Scale.  PT reviewed and interpreted results for pt and pt verbalized understanding.  Recommending use of AD and continued balance training to reduce fall risk.  PT instructed patient in NMR activity shooting basketball with R foot on 3" step to increase L weight shift for improved efficiency and safety during standing/gait activities.  Repeat activity with R foot on 3" step with foam balance strip to increase challenge.  Pt amb to day room for stroke support group at end of session with RW and supervision, demonstrating improved weight shift to L .  Pt left in dayroom with needs met in preparation for SSG.     Therapy Documentation Precautions:  Precautions Precautions: Fall Restrictions Weight Bearing Restrictions: No  Pain: Pain Assessment Pain Assessment: No/denies pain  Balance: Balance Balance Assessed: Yes Standardized Balance Assessment Standardized Balance Assessment: Berg Balance Test Berg Balance Test Sit to Stand: Able to  stand using hands after several tries Standing Unsupported: Able to stand 2 minutes with supervision Sitting with Back Unsupported but Feet Supported on Floor or Stool: Able to sit safely and securely 2 minutes Stand to Sit: Controls descent by using hands Transfers: Able to transfer safely, definite need of hands Standing Unsupported with Eyes Closed: Able to stand 10 seconds with supervision Standing Ubsupported with Feet Together: Able to place feet together independently and stand for 1 minute with supervision From Standing, Reach Forward with Outstretched Arm: Can reach forward >12 cm safely (5") From Standing Position, Pick up Object from Floor: Able to pick up shoe, needs supervision From Standing Position, Turn to Look Behind Over each Shoulder: Looks behind one side only/other side shows less weight shift Turn 360 Degrees: Needs close supervision or verbal cueing Standing Unsupported, Alternately Place Feet on Step/Stool: Needs assistance to keep from falling or unable to try Standing Unsupported, One Foot in Front: Able to plae foot ahead of the other independently and hold 30 seconds Standing on One Leg: Able to lift leg independently and hold > 10 seconds Total Score: 38   See Function Navigator for Current Functional Status.   Therapy/Group: Individual Therapy  Jaidin Ugarte E Penven-Crew 03/01/2015, 3:01 PM

## 2015-03-01 NOTE — Progress Notes (Signed)
Physical Therapy Session Note  Patient Details  Name: [name] MRN: [id number] Date of Birth: [birth year]  Today's Date: 03/01/2015 PT Individual Time: 0830-0900 PT Individual Time Calculation (min): 30 min   Short Term Goals: Week 1:  PT Short Term Goal 1 (Week 1): =LTGs due to ELOS  Skilled Therapeutic Interventions/Progress Updates:    Pt received seated in bed, c/o headache as described below and agreeable to treatment. Bed mobility with mod I. Seated on EOB, performed lower body dressing with S and set-upA for retrieval of items including pants, shoes and LLE AFO. Ambulation 2x10 into bathroom with RW and S. Performed all hygiene and clothing management with distant S. Gait to/from gym [phone]' with RW and S. Nustep x8 min with BUE/BLE for strengthening and aerobic endurance. Educated pt on function of AFO and importance of tieing shoelaces tighter to improve effectiveness of AFO. Agreeable to trial with shoelaces tied tighter to return to room and does report increase ease of LLE progression. Pt remained seated in w/c at completion of session, all needs within reach.   Therapy Documentation Precautions:  Precautions Precautions: Fall Restrictions Weight Bearing Restrictions: No Pain: Pain Assessment Pain Assessment: 0-10 Pain Score: 5  Pain Type: Acute pain Pain Location: Head Pain Orientation: Anterior Pain Descriptors / Indicators: Headache Pain Onset: On-going Patients Stated Pain Goal: 2 Pain Intervention(s): Other (Comment) (pre-medicated) Multiple Pain Sites: No   See Function Navigator for Current Functional Status.   Therapy/Group: Individual Therapy  Vista Lawman 03/01/2015, 10:46 AM

## 2015-03-02 ENCOUNTER — Inpatient Hospital Stay (HOSPITAL_COMMUNITY): Payer: Self-pay | Admitting: Physical Therapy

## 2015-03-02 ENCOUNTER — Inpatient Hospital Stay (HOSPITAL_COMMUNITY): Payer: Medicaid Other | Admitting: Speech Pathology

## 2015-03-02 ENCOUNTER — Inpatient Hospital Stay (HOSPITAL_COMMUNITY): Payer: Medicaid Other | Admitting: Occupational Therapy

## 2015-03-02 NOTE — Progress Notes (Signed)
Occupational Therapy Session Note  Patient Details  Name: [name] MRN: [id number] Date of Birth: [birth year]  Today's Date: 03/02/2015 OT Individual Time: 0835-1000 OT Individual Time Calculation (min): 85 min    Short Term Goals: Week 1:  OT Short Term Goal 1 (Week 1): STG=LTG  Skilled Therapeutic Interventions/Progress Updates:  Pt seen for skilled OT to facilitate LUE functional movement and dynamic balance. Pt reporting that his fiancee assisted him with getting in the shower this am and that he completed all of his ADLs. Informed pt that it is best to have his fiancee involved in family education first to ensure that he will have a safe transfer. Discussed this with his afternoon PT to have fiance go through transfer training. Pt ambulated with RW to gym. See treatments/exercises below.  Focused on holding large objects with sit to stands, arm slides on wall into full ROM, 5# dowel bar chest press, tricep extension, L grasp strengthening holding heavy objects. Pt worked with all exercises well demonstrating good activity tolerance. Ambulated back to room with RW. Washed hand at sink in standing. Pt sat in w/c with all needs met.      grasp: L hand 45 lbs, R hand 110 lbs 9 hole 31 sec on L, 21 sec on R  Therapy Documentation Precautions:  Precautions Precautions: Fall Restrictions Weight Bearing Restrictions: No    Vital Signs: Therapy Vitals BP: 137/72 mmHg Pain: Pain Assessment Pain Assessment: No/denies pain ADL: ADL ADL Comments: see functional navigator Exercises: General Exercises - Lower Extremity Repetitive Sit to Stands: No upper extremities Shoulder Exercises Shoulder Flexion: AAROM Shoulder ABduction: AAROM Elbow Extension: Strengthening Wrist Extension: Strengthening Other Treatments: Treatments Neuromuscular Facilitation: Left;Upper Extremity;Lower Extremity;Activity to increase motor control;Activity to increase coordination;Activity to increase  timing and sequencing;Activity to increase anterior-posterior weight shifting;Activity to increase lateral weight shifting;Activity to increase sustained activation;Activity to increase grading  See Function Navigator for Current Functional Status.   Therapy/Group: Individual Therapy  [place] 03/02/2015, 10:30 AM

## 2015-03-02 NOTE — Progress Notes (Signed)
[facility] PHYSICAL MEDICINE & REHABILITATION     PROGRESS NOTE  Subjective/Complaints:  Sitting up at the edge of his bed this morning. He states she had a good night and noted watching the [facility] game yesterday.  ROS: Left sided weakness and numbness. Denies CP, SOB, n/v/d.  Objective: Vital Signs: Blood pressure 134/75, pulse 56, temperature 97.8 F (36.6 C), temperature source Oral, resp. rate 18, weight 67 kg (147 lb 11.3 oz), SpO2 100 %. No results found.  Recent Labs  02/27/15 0846  WBC 6.4  HGB 13.6  HCT 40.8  PLT 153    Recent Labs  02/27/15 0846  NA 140  K 4.4  CL 103  GLUCOSE 120*  BUN 8  CREATININE 1.02  CALCIUM 9.2   CBG (last 3)  No results for input(s): GLUCAP in the last 72 hours.  Wt Readings from Last 3 Encounters:  03/02/15 67 kg (147 lb 11.3 oz)  02/26/15 67.631 kg (149 lb 1.6 oz)  12/06/14 68.856 kg (151 lb 12.8 oz)    Physical Exam:  BP 134/75 mmHg  Pulse 56  Temp(Src) 97.8 F (36.6 C) (Oral)  Resp 18  Wt 67 kg (147 lb 11.3 oz)  SpO2 100% Constitutional: He appears well-developed and well-nourished. NAD HENT: Normocephalic and atraumatic.  Eyes: EOM are normal. Right eye exhibits no discharge. Left eye exhibits no discharge.  Cardiovascular: Normal rate and regular rhythm.  Respiratory: Effort normal and breath sounds normal. No respiratory distress.  GI: Soft. Bowel sounds are normal. He exhibits no distension.  Musculoskeletal: He exhibits no edema or tenderness.  Neurological: He is alert and oriented.  Speech is a bit dysarthric but intelligible.  He follows simple commands.  Fair awareness of deficits Motor: RUE/RLE: 5/5 proximal distal LUE: 4+/5 proximal distal LLE: Hip flexion, knee extension 4-/5, ankle dorsi/plantarflexion 4-/5  Skin: Skin is warm and dry.  Psychiatric: He has a normal mood and affect. His behavior is normal  Assessment/Plan: 1. Functional deficits secondary to right MCA infarct which require  3+ hours per day of interdisciplinary therapy in a comprehensive inpatient rehab setting. Physiatrist is providing close team supervision and 24 hour management of active medical problems listed below. Physiatrist and rehab team continue to assess barriers to discharge/monitor patient progress toward functional and medical goals.  Function:  Bathing Bathing position   Position: Shower  Bathing parts Body parts bathed by patient: Right arm, Left arm, Left upper leg, Right lower leg, Left lower leg, Abdomen, Chest, Front perineal area, Buttocks, Right upper leg Body parts bathed by helper: Back  Bathing assist Assist Level: Supervision or verbal cues      Upper Body Dressing/Undressing Upper body dressing   What is the patient wearing?: Pull over shirt/dress     Pull over shirt/dress - Perfomed by patient: Thread/unthread right sleeve, Thread/unthread left sleeve, Put head through opening, Pull shirt over trunk          Upper body assist Assist Level: Supervision or verbal cues      Lower Body Dressing/Undressing Lower body dressing   What is the patient wearing?: Underwear, Pants, Socks, Shoes, AFO Underwear - Performed by patient: Thread/unthread right underwear leg, Thread/unthread left underwear leg, Pull underwear up/down   Pants- Performed by patient: Thread/unthread right pants leg, Thread/unthread left pants leg, Pull pants up/down, Fasten/unfasten pants       Socks - Performed by patient: Don/doff right sock, Don/doff left sock       AFO - Performed by patient:  Don/doff right AFO, Don/doff left AFO        Lower body assist Assist for lower body dressing: Supervision or verbal cues      Toileting Toileting   Toileting steps completed by patient: Adjust clothing prior to toileting, Performs perineal hygiene, Adjust clothing after toileting      Toileting assist Assist level: Supervision or verbal cues   Transfers Chair/bed transfer   Chair/bed transfer  method: Ambulatory Chair/bed transfer assist level: Supervision or verbal cues Chair/bed transfer assistive device: Armrests, Patent attorney     Max distance: 150  Assist level: Supervision or verbal cues   Wheelchair   Type: Manual Max wheelchair distance: 150 Assist Level: No help, No cues, assistive device, takes more than reasonable amount of time  Cognition Comprehension Comprehension assist level: Follows basic conversation/direction with no assist  Expression Expression assist level: Expresses basic needs/ideas: With no assist  Social Interaction Social Interaction assist level: Interacts appropriately 90% of the time - Needs monitoring or encouragement for participation or interaction.  Problem Solving Problem solving assist level: Solves complex 90% of the time/cues < 10% of the time  Memory Memory assist level: Recognizes or recalls 90% of the time/requires cueing < 10% of the time    Medical Problem List and Plan: 1. Left-sided weakness and dysarthria secondary to right MCA infarct on 02/24/15  Cont CIR 2. DVT Prophylaxis/Anticoagulation: Subcutaneous Lovenox. Monitor platelet counts or signs of bleeding 3. Pain Management: Tylenol as needed 4. Hypertension. Aldactone 12.5 mg daily. Monitor with increased mobility  Lisinopril decreased to 5mg  to allow for permissive HTN for time being on 2/8  Will also consider adding BB in future, if/when HR allows 5. Neuropsych: This patient is capable of making decisions on his own behalf. 6. Skin/Wound Care: Routine skin checks 7. Fluids/Electrolytes/Nutrition: Routine I&O's  BMP within relatively acceptable range 8. Alcohol/polysubstance abuse.   Provide counseling  CIWA 9.NICM. Echocardiogram with ejection fraction of 20%. Monitor with increased mobility 10. Diastolic congestive heart failure. Weigh patient daily monitor for any signs of fluid overload. 11. ABLA: Resolved  Hb 13.6 on 2/7  LOS (Days) 4 A  FACE TO FACE EVALUATION WAS PERFORMED  Ankit Karis Juba 03/02/2015 8:23 AM

## 2015-03-02 NOTE — Progress Notes (Signed)
Physical Therapy Session Note  Patient Details  Name: [name] MRN: [id number] Date of Birth: Jan 24, 1965  Today's Date: 03/02/2015 PT Individual Time: 1300-1357 PT Individual Time Calculation (min): 57 min   Short Term Goals: Week 1:  PT Short Term Goal 1 (Week 1): =LTGs due to ELOS  Skilled Therapeutic Interventions/Progress Updates:   Pt received in w/c with fiance present.  Pt agreeable to continue gait training and orthotic assessment.  Pt noted that he usually wears his tennis shoes very loose and not tied fully; educated pt that in order for AFO to be effective pressure over dorsum of foot needs to be tighter in shoe.  Pt performed w/c mobility to gym with bilat UE propulsion and mod I.  In gym performed AFO and gait training first with RW x 100' + 100' with R quad cane and min A for gait with RW but mod A for gait with quad cane with therapist providing verbal cues to widen BOS (pt adducting R foot) and to facilitate L lateral and anterior weight shift during L stance for full step length RLE.  Pt demonstrating improved recall and carryover of heel strike on LLE.  Also performed negotiation of full flight of stairs to simulate fiance's home environment to access bed and bathroom upstairs.  Fiance reports rail is on R side; demonstrated sequence to pt and pt return demonstrated safe step to sequence laterally with bilat UE support on R rail with pt leading with RLE to ascend and LLE to descend with min-mod A to advance COG forwards over BOS.  Returned to room and had pt and fiance practice and demonstrate transfers in the room: performed ambulation to/from toilet with RW and slip on shoes (pt reports he wears these at night) with fiance providing min A for balance and safety and bed <> w/c transfers squat pivot with UE support on rail or arm rest and fiance providing supervision-therapist provided verbal cues for safe placement of w/c beside bed and safety with gait in/out of bathroom.   Fiance checked off to perform these transfers with pt in the room.  Pt left EOB with all items within reach.  Order for AFO obtained and P&O contacted for orthotics clinic on Monday.   Therapy Documentation Precautions:  Precautions Precautions: Fall Restrictions Weight Bearing Restrictions: No Vital Signs: Therapy Vitals Temp: 98.8 F (37.1 C) Temp Source: Oral Pulse Rate: (!) 59 Resp: 18 BP: 124/70 mmHg Patient Position (if appropriate): Lying Oxygen Therapy SpO2: 99 % O2 Device: Not Delivered Pain: Pain Assessment Pain Assessment: No/denies pain  See Function Navigator for Current Functional Status.   Therapy/Group: Individual Therapy  Edman Circle Faucette 03/02/2015, 2:12 PM

## 2015-03-02 NOTE — Progress Notes (Signed)
Orthopedic Tech Progress Note Patient Details:  [name] 03-19-1965 [id number]  Patient ID: [name], male   DOB: [birth year], 50 y.o.   MRN: [id number] Called in advanced brace order; spoke with Loreli Slot, Robben Jagiello 03/02/2015, 2:36 PM

## 2015-03-02 NOTE — Progress Notes (Signed)
Speech Language Pathology Discharge Summary  Patient Details  Name: [name] MRN: [id number] Date of Birth: Apr 15, 1965  Today's Date: 03/02/2015 SLP Individual Time: 1100-1149 SLP Individual Time Calculation (min): 49 min   Skilled Therapeutic Interventions:  Pt was seen for skilled ST targeting cognitive goals and training.  Pt's fiancee was present for the duration of today's therapy session.  SLP provided skilled education and provided handout of memory compensatory strategies to maximize carryover of new information in the home environment.  Pt's fiancee verified that she will be able to assist pt in medication and financial management at discharge.  Pt was 100% accurate with mod I during a semi-complex new learning task targeting working memory and delayed recall of information.  All questions were answered to pt's and fiancee's satisfaction at this time and all individuals were agreeable with pt's discharge from [facility] services as he appears to be at baseline for cognition.  Pt was returned to room and left in wheelchair with fiancee at bedside.      Patient has met 2 of 2 long term goals.  Patient to discharge at overall Modified Independent level.  Reasons goals not met:  n/a  Clinical Impression/Discharge Summary:  Pt is discharging from [facility] having met 2 out of 2 long term goals.  Pt is currently mod I for basic to semi-complex tasks and is returned to baseline for cognition.  Pt and family education is complete at this time.  Pt will have initial assistance for medication and financial management at discharge.  No further ST needs are indicated at this time.    Care Partner:  Caregiver Able to Provide Assistance: Yes     Recommendation:  None      Equipment: none recommended by SLP    Reasons for discharge: Treatment goals met;Other (comment) (pt at baseline for mentation )       Function:  Eating Eating                 Cognition Comprehension Comprehension  assist level: Follows basic conversation/direction with no assist  Expression   Expression assist level: Expresses basic needs/ideas: With no assist  Social Interaction Social Interaction assist level: Interacts appropriately 90% of the time - Needs monitoring or encouragement for participation or interaction.  Problem Solving Problem solving assist level: Solves complex 90% of the time/cues < 10% of the time  Memory Memory assist level: Recognizes or recalls 90% of the time/requires cueing < 10% of the time   Emilio Math 03/02/2015, 12:49 PM

## 2015-03-03 ENCOUNTER — Inpatient Hospital Stay (HOSPITAL_COMMUNITY): Payer: Self-pay | Admitting: Physical Therapy

## 2015-03-03 ENCOUNTER — Inpatient Hospital Stay (HOSPITAL_COMMUNITY): Payer: Medicaid Other | Admitting: Occupational Therapy

## 2015-03-03 NOTE — Progress Notes (Signed)
Occupational Therapy Session Note  Patient Details  Name: [name] MRN: [id number] Date of Birth: December 14, 1965  Today's Date: 03/03/2015 OT Individual Time: 0835-1005 OT Individual Time Calculation (min): 90 min    Skilled Therapeutic Interventions/Progress Updates: Patient already bathed and dressed with his fiance prior to session this am.    He participated in skilled OT services as follows:     Visual focus and endurance activities (He stated his vision was a little worse since the stroke);  Fine motor coordination and strengthening activiites via putty and rubber band, including activities to strengthen left hand dorsal interossei for spreading fingers apart.  Fiance present & attentive during the session.  Also educated patient and fiance on activities to increase left hand/digital strength, coordination and dexterity.   Also this clinician reminded patient and fiance of importance of patient locking his w/c brakes before bending forward in chair to do things such as don socks and tie shoes so chair does not like out from underneath and behind him.   He remembered to lock brakes first 4/5 times before leaning forward to complete shoe, AFO donning and othe r activities.     Therapy Documentation Precautions:  Precautions Precautions: Fall Restrictions Weight Bearing Restrictions: No Pain: Pain Assessment Pain Assessment: No/denies pain  See Function Navigator for Current Functional Status.   Therapy/Group: Individual Therapy  [name] [facility] 03/03/2015, 5:28 PM

## 2015-03-03 NOTE — Plan of Care (Signed)
Problem: RH Stairs Goal: LTG Patient will ambulate up and down stairs w/assist (PT) LTG: Patient will ambulate up and down # of stairs with assistance (PT)  Downgraded due to pt fear on stairs and discomfort in LLE with negotiation of full flight of stairs.

## 2015-03-03 NOTE — Progress Notes (Signed)
Physical Therapy Session Note  Patient Details  Name: [name] MRN: [id number] Date of Birth: 1965/07/18  Today's Date: 03/03/2015 PT Individual Time:1103-1159 and 1420-1500 PT Individual Time Calculation (min): 56 min and 40 min   Short Term Goals: Week 1:  PT Short Term Goal 1 (Week 1): =LTGs due to ELOS  Skilled Therapeutic Interventions/Progress Updates:   Session 1: Pt received resting in w/c and agreeable to therapy session.  Session focus on functional mobility, endurance, floor transfers, stair training, and NMR.  Sit<>stand from w/c with supervision and RW and amb to ortho gym with supervision.  PT instructed pt in car transfer with supervision and RW.  Pt amb to therapy gym with supervision and PT instructed in floor transfer x2 with min assist on first trial and supervision with question cues on second trial.  PT educated pt on self assessment if he needed to perform a floor transfer and home and pt able to teach back self assessment with min question cues.  PT instructed pt in stair negotiation using RW, backwards ascent/forward descent with min assist x2 stairs.  Pt reports he feels better doing fewer stairs in this manner than he did doing a full flight in previous sessions.  Pt states he thinks he will like to d/c to his own home where there are fewer stairs for this reason.  PT instructed pt in LLE PNF patterns x10 reps PROM, x10 reps AAROM, and x10 reps with resistance in D1 and D2 pattern for NMR.  Pt amb back to room at end of session with supervision and positioned in w/c with call bell in reach and needs met.   Session 2: Pt received in w/c and agreeable to therapy session.  Session focus on family ed for stair negotiation and NMR for L hamstring activation.  Pt propelled w/c to/from therapy gym mod I.  PT instructed pt's fiancee in stair negotiation as practiced in AM session.  Pt ascended 2 steps backwards and descended forwards with RW with fiancee providing min assist  and pt self directing sequencing. Both pt and fiancee felt comfortable with stair negotiation.  Pt amb to therapy mat and engaged in tall kneeling 2x5 minutes for simple sorting task for hamstring activation.  Pt reporting dizziness upon coming out of tall kneeling on first trial, BP assessed and 121/70.  Pt reports resolution of symptoms after 1 minute resting.  BP assessed during second trial and 118/78.  Pt performed squat/pivot to w/c with set up only. 5 minutes on arm bike forwards and backwards for UE strengthening and overall endurance.  Pt propelled w/c to room at end of session and positioned with call bell in reach and needs met.   Therapy Documentation Precautions:  Precautions Precautions: Fall Restrictions Weight Bearing Restrictions: No Pain: Pain Assessment Pain Assessment: No/denies pain   See Function Navigator for Current Functional Status.   Therapy/Group: Individual Therapy  Earnest Conroy Penven-Crew 03/03/2015, 4:02 PM

## 2015-03-03 NOTE — Plan of Care (Signed)
Problem: RH Ambulation Goal: LTG Patient will ambulate in community environment (PT) LTG: Patient will ambulate in community environment, # of feet with assistance (PT).  Downgraded due to ongoing balance deficits in situations requiring decreased BOS.

## 2015-03-03 NOTE — Progress Notes (Signed)
PHYSICAL MEDICINE & REHABILITATION     PROGRESS NOTE  Subjective/Complaints:  Patient seen this morning resting comfortably in bed. He states he is looking forward to watching the [facility] and [employer] game tonight.  ROS:  Denies CP, SOB, n/v/d.  Objective: Vital Signs: Blood pressure 127/65, pulse 57, temperature 97.8 F (36.6 C), temperature source Oral, resp. rate 18, weight 69.7 kg (153 lb 10.6 oz), SpO2 100 %. No results found. No results for input(s): WBC, HGB, HCT, PLT in the last 72 hours. No results for input(s): NA, K, CL, GLUCOSE, BUN, CREATININE, CALCIUM in the last 72 hours.  Invalid input(s): CO CBG (last 3)  No results for input(s): GLUCAP in the last 72 hours.  Wt Readings from Last 3 Encounters:  03/03/15 69.7 kg (153 lb 10.6 oz)  02/26/15 67.631 kg (149 lb 1.6 oz)  12/06/14 68.856 kg (151 lb 12.8 oz)    Physical Exam:  BP 127/65 mmHg  Pulse 57  Temp(Src) 97.8 F (36.6 C) (Oral)  Resp 18  Wt 69.7 kg (153 lb 10.6 oz)  SpO2 100% Constitutional: He appears well-developed and well-nourished. NAD HENT: Normocephalic and atraumatic.  Eyes: EOM are normal. Right eye exhibits no discharge. Left eye exhibits no discharge.  Cardiovascular: Normal rate and regular rhythm.  Respiratory: Effort normal and breath sounds normal. No respiratory distress.  GI: Soft. Bowel sounds are normal. He exhibits no distension.  Musculoskeletal: He exhibits no edema or tenderness.  Neurological: He is alert and oriented.  Speech mildly dysarthric.  He follows simple commands.  Fair awareness of deficits Motor: RUE/RLE: 5/5 proximal distal LUE: 4+/5 proximal distal LLE: Hip flexion, knee extension 4-/5, ankle dorsi/plantarflexion 4-/5  Skin: Skin is warm and dry.  Psychiatric: He has a normal mood and affect. His behavior is normal  Assessment/Plan: 1. Functional deficits secondary to right MCA infarct which require 3+ hours per day of interdisciplinary therapy in a  comprehensive inpatient rehab setting. Physiatrist is providing close team supervision and 24 hour management of active medical problems listed below. Physiatrist and rehab team continue to assess barriers to discharge/monitor patient progress toward functional and medical goals.  Function:  Bathing Bathing position   Position: Shower  Bathing parts Body parts bathed by patient: Right arm, Left arm, Left upper leg, Right lower leg, Left lower leg, Abdomen, Chest, Front perineal area, Buttocks, Right upper leg Body parts bathed by helper: Back  Bathing assist Assist Level: Supervision or verbal cues      Upper Body Dressing/Undressing Upper body dressing   What is the patient wearing?: Pull over shirt/dress     Pull over shirt/dress - Perfomed by patient: Thread/unthread right sleeve, Thread/unthread left sleeve, Put head through opening, Pull shirt over trunk          Upper body assist Assist Level: Supervision or verbal cues      Lower Body Dressing/Undressing Lower body dressing   What is the patient wearing?: Underwear, Pants, Socks, Shoes, AFO Underwear - Performed by patient: Thread/unthread right underwear leg, Thread/unthread left underwear leg, Pull underwear up/down   Pants- Performed by patient: Thread/unthread right pants leg, Thread/unthread left pants leg, Pull pants up/down, Fasten/unfasten pants       Socks - Performed by patient: Don/doff right sock, Don/doff left sock       AFO - Performed by patient: Don/doff right AFO, Don/doff left AFO        Lower body assist Assist for lower body dressing: Supervision or verbal cues  Toileting Toileting   Toileting steps completed by patient: Adjust clothing prior to toileting, Performs perineal hygiene, Adjust clothing after toileting      Toileting assist Assist level: Supervision or verbal cues   Transfers Chair/bed transfer   Chair/bed transfer method: Ambulatory, Stand pivot, Squat  pivot Chair/bed transfer assist level: Touching or steadying assistance (Pt > 75%) Chair/bed transfer assistive device: Armrests, Walker, Orthosis     Locomotion Ambulation     Max distance: 150  Assist level: Touching or steadying assistance (Pt > 75%)   Wheelchair   Type: Manual Max wheelchair distance: 150 Assist Level: No help, No cues, assistive device, takes more than reasonable amount of time  Cognition Comprehension Comprehension assist level: Follows basic conversation/direction with no assist  Expression Expression assist level: Expresses basic needs/ideas: With no assist  Social Interaction Social Interaction assist level: Interacts appropriately 90% of the time - Needs monitoring or encouragement for participation or interaction.  Problem Solving Problem solving assist level: Solves complex 90% of the time/cues < 10% of the time  Memory Memory assist level: Recognizes or recalls 90% of the time/requires cueing < 10% of the time    Medical Problem List and Plan: 1. Left-sided weakness and dysarthria secondary to right MCA infarct on 02/24/15  Cont CIR 2. DVT Prophylaxis/Anticoagulation: Subcutaneous Lovenox. Monitor platelet counts or signs of bleeding 3. Pain Management: Tylenol as needed 4. Hypertension. Aldactone 12.5 mg daily. Monitor with increased mobility  Lisinopril decreased to 5mg  to allow for permissive HTN for time being on 2/8, will resume home dose if necessary  Will also consider adding BB in future, if/when HR allows 5. Neuropsych: This patient is capable of making decisions on his own behalf. 6. Skin/Wound Care: Routine skin checks 7. Fluids/Electrolytes/Nutrition: Routine I&O's  BMP within relatively acceptable range 8. Alcohol/polysubstance abuse.   Provide counseling  CIWA 9.NICM. Echocardiogram with ejection fraction of 20%. Monitor with increased mobility 10. Diastolic congestive heart failure. Weigh patient daily monitor for any signs of fluid  overload. 11. ABLA: Resolved  Hb 13.6 on 2/7  LOS (Days) 5 A FACE TO FACE EVALUATION WAS PERFORMED  Ankit Karis Juba 03/03/2015 12:34 PM

## 2015-03-04 ENCOUNTER — Inpatient Hospital Stay (HOSPITAL_COMMUNITY): Payer: Self-pay | Admitting: Occupational Therapy

## 2015-03-04 NOTE — Progress Notes (Signed)
[facility] PHYSICAL MEDICINE & REHABILITATION     PROGRESS NOTE  Subjective/Complaints:  Pt states he slept well overnight, stating he fell asleep during the basketball game.  ROS:  Denies CP, SOB, n/v/d.  Objective: Vital Signs: Blood pressure 141/77, pulse 55, temperature 97.9 F (36.6 C), temperature source Oral, resp. rate 18, weight 69.4 kg (153 lb), SpO2 100 %. No results found. No results for input(s): WBC, HGB, HCT, PLT in the last 72 hours. No results for input(s): NA, K, CL, GLUCOSE, BUN, CREATININE, CALCIUM in the last 72 hours.  Invalid input(s): CO CBG (last 3)  No results for input(s): GLUCAP in the last 72 hours.  Wt Readings from Last 3 Encounters:  03/04/15 69.4 kg (153 lb)  02/26/15 67.631 kg (149 lb 1.6 oz)  12/06/14 68.856 kg (151 lb 12.8 oz)    Physical Exam:  BP 141/77 mmHg  Pulse 55  Temp(Src) 97.9 F (36.6 C) (Oral)  Resp 18  Wt 69.4 kg (153 lb)  SpO2 100% Constitutional: He appears well-developed and well-nourished. NAD HENT: Normocephalic and atraumatic.  Eyes: EOM are normal. Right eye exhibits no discharge. Left eye exhibits no discharge.  Cardiovascular: Normal rate and regular rhythm.  Respiratory: Effort normal and breath sounds normal. No respiratory distress.  GI: Soft. Bowel sounds are normal. He exhibits no distension.  Musculoskeletal: He exhibits no edema or tenderness.  Neurological: He is alert and oriented.  Speech mildly dysarthric.  He follows simple commands.  Fair awareness of deficits Motor: RUE/RLE: 5/5 proximal distal LUE: 4+/5 proximal distal LLE: Hip flexion, knee extension 4-/5, ankle dorsi/plantarflexion 4+/5  Skin: Skin is warm and dry.  Psychiatric: He has a normal mood and affect. His behavior is normal  Assessment/Plan: 1. Functional deficits secondary to right MCA infarct which require 3+ hours per day of interdisciplinary therapy in a comprehensive inpatient rehab setting. Physiatrist is providing  close team supervision and 24 hour management of active medical problems listed below. Physiatrist and rehab team continue to assess barriers to discharge/monitor patient progress toward functional and medical goals.  Function:  Bathing Bathing position   Position: Shower  Bathing parts Body parts bathed by patient: Right arm, Left arm, Left upper leg, Right lower leg, Left lower leg, Abdomen, Chest, Front perineal area, Buttocks, Right upper leg Body parts bathed by helper: Back  Bathing assist Assist Level: Supervision or verbal cues      Upper Body Dressing/Undressing Upper body dressing   What is the patient wearing?: Pull over shirt/dress     Pull over shirt/dress - Perfomed by patient: Thread/unthread right sleeve, Thread/unthread left sleeve, Put head through opening, Pull shirt over trunk          Upper body assist Assist Level: Supervision or verbal cues      Lower Body Dressing/Undressing Lower body dressing   What is the patient wearing?: Underwear, Pants, Socks, Shoes, AFO Underwear - Performed by patient: Thread/unthread right underwear leg, Thread/unthread left underwear leg, Pull underwear up/down   Pants- Performed by patient: Thread/unthread right pants leg, Thread/unthread left pants leg, Pull pants up/down, Fasten/unfasten pants       Socks - Performed by patient: Don/doff right sock, Don/doff left sock       AFO - Performed by patient: Don/doff left AFO        Lower body assist Assist for lower body dressing: Touching or steadying assistance (Pt > 75%)      Toileting Toileting   Toileting steps completed by patient: Adjust  clothing prior to toileting, Performs perineal hygiene, Adjust clothing after toileting   Toileting Assistive Devices: Grab bar or rail  Toileting assist Assist level: Supervision or verbal cues   Transfers Chair/bed transfer   Chair/bed transfer method: Squat pivot Chair/bed transfer assist level: Set up only Chair/bed  transfer assistive device: Armrests     Locomotion Ambulation     Max distance: 150  Assist level: Supervision or verbal cues   Wheelchair   Type: Manual Max wheelchair distance: 150 Assist Level: No help, No cues, assistive device, takes more than reasonable amount of time  Cognition Comprehension Comprehension assist level: Follows basic conversation/direction with no assist  Expression Expression assist level: Expresses basic needs/ideas: With no assist  Social Interaction Social Interaction assist level: Interacts appropriately 90% of the time - Needs monitoring or encouragement for participation or interaction.  Problem Solving Problem solving assist level: Solves complex 90% of the time/cues < 10% of the time  Memory Memory assist level: Recognizes or recalls 90% of the time/requires cueing < 10% of the time    Medical Problem List and Plan: 1. Left-sided weakness and dysarthria secondary to right MCA infarct on 02/24/15  Cont CIR 2. DVT Prophylaxis/Anticoagulation: Subcutaneous Lovenox. Monitor platelet counts or signs of bleeding 3. Pain Management: Tylenol as needed 4. Hypertension. Aldactone 12.5 mg daily. Monitor with increased mobility  Lisinopril decreased to 5mg  to allow for permissive HTN for time being on 2/8, will resume home dose if necessary  Will also consider adding BB in future, if/when HR allows  141/77 this AM.  Will cont to monitor 5. Neuropsych: This patient is capable of making decisions on his own behalf. 6. Skin/Wound Care: Routine skin checks 7. Fluids/Electrolytes/Nutrition: Routine I&O's  Eating 100% of his meals  BMP within relatively acceptable range 8. Alcohol/polysubstance abuse.   Provide counseling  CIWA 9.NICM. Echocardiogram with ejection fraction of 20%. Monitor with increased mobility 10. Diastolic congestive heart failure. Weigh patient daily monitor for any signs of fluid overload. 11. ABLA: Resolved  Hb 13.6 on 2/7  LOS (Days)  6 A FACE TO FACE EVALUATION WAS PERFORMED  Jathniel Smeltzer Karis Juba 03/04/2015 1:58 PM

## 2015-03-04 NOTE — Progress Notes (Addendum)
Pt went out with NT to use grounds pass (orders state must use pass with staff only). Girlfriend called [employer] operational officer claiming she saw pt smoking outside. NT confirms pt had no access to cigarettes while out on grounds pass.   Teach back session with patient about stroke prophylaxis, tobacco cessation, follow up appointments, stress management and barriers to self care. Pt voiced concern over how to have access to follow up care and medication. Advised him to ask questions of social work team prior to discharge.

## 2015-03-05 ENCOUNTER — Inpatient Hospital Stay (HOSPITAL_COMMUNITY): Payer: Self-pay | Admitting: Physical Therapy

## 2015-03-05 ENCOUNTER — Ambulatory Visit (HOSPITAL_COMMUNITY): Payer: Self-pay

## 2015-03-05 ENCOUNTER — Inpatient Hospital Stay (HOSPITAL_COMMUNITY): Payer: Medicaid Other | Admitting: Occupational Therapy

## 2015-03-05 LAB — CREATININE, SERUM
CREATININE: 1.09 mg/dL (ref 0.61–1.24)
GFR calc non Af Amer: >60 mL/min (ref >60)

## 2015-03-05 NOTE — Progress Notes (Signed)
[place] PHYSICAL MEDICINE & REHABILITATION     PROGRESS NOTE  Subjective/Complaints:  Pt doing well this morning.  He is very anxious to go home.    ROS:  Denies CP, SOB, n/v/d.  Objective: Vital Signs: Blood pressure 153/89, pulse 58, temperature 98 F (36.7 C), temperature source Oral, resp. rate 17, weight 68 kg (149 lb 14.6 oz), SpO2 100 %. No results found. No results for input(s): WBC, HGB, HCT, PLT in the last 72 hours.  Recent Labs  03/05/15 0510  CREATININE 1.09   CBG (last 3)  No results for input(s): GLUCAP in the last 72 hours.  Wt Readings from Last 3 Encounters:  03/05/15 68 kg (149 lb 14.6 oz)  02/26/15 67.631 kg (149 lb 1.6 oz)  12/06/14 68.856 kg (151 lb 12.8 oz)    Physical Exam:  BP 153/89 mmHg  Pulse 58  Temp(Src) 98 F (36.7 C) (Oral)  Resp 17  Wt 68 kg (149 lb 14.6 oz)  SpO2 100% Constitutional: He appears well-developed and well-nourished. NAD HENT: Normocephalic and atraumatic.  Eyes: EOM are normal. Right eye exhibits no discharge. Left eye exhibits no discharge.  Cardiovascular: Normal rate and regular rhythm.  Respiratory: Effort normal and breath sounds normal. No respiratory distress.  GI: Soft. Bowel sounds are normal. He exhibits no distension.  Musculoskeletal: He exhibits no edema or tenderness.  Neurological: He is alert and oriented.  Speech mildly dysarthric.  He follows simple commands.  Fair awareness of deficits Motor: RUE/RLE: 5/5 proximal distal LUE: 4+/5 proximal distal LLE: Hip flexion, knee extension 4/5, ankle dorsi/plantarflexion 4/5  Skin: Skin is warm and dry.  Psychiatric: He has a normal mood and affect. His behavior is normal  Assessment/Plan: 1. Functional deficits secondary to right MCA infarct which require 3+ hours per day of interdisciplinary therapy in a comprehensive inpatient rehab setting. Physiatrist is providing close team supervision and 24 hour management of active medical problems listed  below. Physiatrist and rehab team continue to assess barriers to discharge/monitor patient progress toward functional and medical goals.  Function:  Bathing Bathing position   Position: Shower  Bathing parts Body parts bathed by patient: Right arm, Left arm, Left upper leg, Right lower leg, Left lower leg, Abdomen, Chest, Front perineal area, Buttocks, Right upper leg Body parts bathed by helper: Back  Bathing assist Assist Level: Supervision or verbal cues      Upper Body Dressing/Undressing Upper body dressing   What is the patient wearing?: Pull over shirt/dress     Pull over shirt/dress - Perfomed by patient: Thread/unthread right sleeve, Thread/unthread left sleeve, Put head through opening, Pull shirt over trunk          Upper body assist Assist Level: Supervision or verbal cues      Lower Body Dressing/Undressing Lower body dressing   What is the patient wearing?: Underwear, Pants, Socks, Shoes, AFO Underwear - Performed by patient: Thread/unthread right underwear leg, Thread/unthread left underwear leg, Pull underwear up/down   Pants- Performed by patient: Thread/unthread right pants leg, Thread/unthread left pants leg, Pull pants up/down, Fasten/unfasten pants       Socks - Performed by patient: Don/doff right sock, Don/doff left sock       AFO - Performed by patient: Don/doff left AFO        Lower body assist Assist for lower body dressing: Touching or steadying assistance (Pt > 75%)      Toileting Toileting   Toileting steps completed by patient: Adjust clothing prior  to toileting, Performs perineal hygiene, Adjust clothing after toileting   Toileting Assistive Devices: Grab bar or rail  Toileting assist Assist level: Supervision or verbal cues   Transfers Chair/bed transfer   Chair/bed transfer method: Squat pivot Chair/bed transfer assist level: Set up only Chair/bed transfer assistive device: Armrests     Locomotion Ambulation     Max  distance: 150  Assist level: Supervision or verbal cues   Wheelchair   Type: Manual Max wheelchair distance: 150 Assist Level: No help, No cues, assistive device, takes more than reasonable amount of time  Cognition Comprehension Comprehension assist level: Follows basic conversation/direction with no assist  Expression Expression assist level: Expresses basic needs/ideas: With no assist  Social Interaction Social Interaction assist level: Interacts appropriately 90% of the time - Needs monitoring or encouragement for participation or interaction.  Problem Solving Problem solving assist level: Solves complex 90% of the time/cues < 10% of the time  Memory Memory assist level: Recognizes or recalls 90% of the time/requires cueing < 10% of the time    Medical Problem List and Plan: 1. Left-sided weakness and dysarthria secondary to right MCA infarct on 02/24/15  Cont CIR 2. DVT Prophylaxis/Anticoagulation: Subcutaneous Lovenox. Monitor platelet counts or signs of bleeding 3. Pain Management: Tylenol as needed 4. Hypertension. Aldactone 12.5 mg daily. Monitor with increased mobility  Lisinopril decreased to 5mg  to allow for permissive HTN for time being on 2/8, increased back to 20 on 2/13  HR does not allow for BB at present  Will cont to monitor 5. Neuropsych: This patient is capable of making decisions on his own behalf. 6. Skin/Wound Care: Routine skin checks 7. Fluids/Electrolytes/Nutrition: Routine I&O's  Eating 100% of his meals  BMP within relatively acceptable range on 2/7 8. Alcohol/polysubstance abuse.   Provide counseling  CIWA 9.NICM. Echocardiogram with ejection fraction of 20%. Monitor with increased mobility 10. Diastolic congestive heart failure. Weigh patient daily monitor for any signs of fluid overload. 11. ABLA: Resolved  Hb 13.6 on 2/7  LOS (Days) 7 A FACE TO FACE EVALUATION WAS PERFORMED  [name] 03/05/2015 9:06 AM

## 2015-03-05 NOTE — Progress Notes (Signed)
Occupational Therapy Session Note  Patient Details  Name: [name] MRN: [id number] Date of Birth: Feb 25, 1965  Today's Date: 03/05/2015 OT Individual Time: 4098-1191 and 1400-1455 OT Individual Time Calculation (min): 73 min and 55 min    Short Term Goals: Week 1:  OT Short Term Goal 1 (Week 1): STG=LTG  Skilled Therapeutic Interventions/Progress Updates:  Session 1: Upon entering the room, pt seated in wheelchair awaiting therapist. Pt with no c/o pain. Pt ambulating in room with RW to obtain all clothing items with increased time. Pt bathing at shower level while seated on TTB and utilizing lateral leans to wash buttocks with mod I . Pt needing supervision for safety during shower transfer secondary to increased fall risk. Pt needing rest break after exiting the shower and dressing from wheelchair with sit <>stand and mod I for task. Pt engaged in [facility] tasks with L hand with use of red, medium soft theraputty with min verbal cues for proper technique. Pt remained in wheelchair at end of session with call bell and all needed items within reach upon exiting the room.   Session 2: Pt transitioning easily from PT session with no c/o pain. Pt requesting to rest secondary to fatigue. OT propelling pt via wheelchair outside in order to conserve energy. Pt ambulating up and down 4 steps outside with RW and supervision with min verbal cues for technique. Pt ambulating outside on various uneven surfaces with RW and mod I. Pt returning to rehab floor via wheelchair secondary to fatigue. Pt demonstrated ability to perform tub transfer with TTB and RW with supervision. OT again recommended pt purchase safety treads in order to decrease fall risk. Pt returning to room at end of session with call bell and all needed items within reach upon exiting the room.    Therapy Documentation Precautions:  Precautions Precautions: Fall Restrictions Weight Bearing Restrictions: No Vital Signs: Therapy  Vitals BP: (!) 153/89 mmHg Pain: Pain Assessment Pain Assessment: No/denies pain Pain Score: 0-No pain ADL: ADL ADL Comments: see functional navigator  See Function Navigator for Current Functional Status.   Therapy/Group: Individual Therapy  Lowella Grip 03/05/2015, 12:14 PM

## 2015-03-05 NOTE — Progress Notes (Signed)
Occupational Therapy Discharge Summary  Patient Details  Name: [name] MRN: [id number] Date of Birth: 03/28/1965     Patient has met 59 of 14 long term goals due to improved activity tolerance, improved balance, postural control, ability to compensate for deficits, functional use of  LEFT upper and LEFT lower extremity, improved attention, improved awareness and improved coordination.  Patient to discharge at overall Mod I with supervision for shower transfer level.    Reasons goals not met: all goals met  Recommendation:  Patient will benefit from ongoing skilled OT services in home health setting to continue to advance functional skills in the area of BADL and iADL.  Equipment: Tub Producer, television/film/video and recommended purchase of safety treads  Reasons for discharge: treatment goals met  Patient/family agrees with progress made and goals achieved: Yes  OT Discharge Precautions/Restrictions  Precautions Precautions: Fall Restrictions Weight Bearing Restrictions: No General   Vital Signs Therapy Vitals Temp: 98.8 F (37.1 C) Temp Source: Oral Pulse Rate: 68 Resp: 18 BP: 128/88 mmHg Patient Position (if appropriate): Sitting Oxygen Therapy SpO2: 99 % O2 Device: Not Delivered Pain Pain Assessment Pain Assessment: No/denies pain ADL ADL ADL Comments: see functional navigator Vision/Perception  Vision- History Baseline Vision/History: Wears glasses Wears Glasses: Reading only Patient Visual Report: No change from baseline  Cognition Overall Cognitive Status: Within Functional Limits for tasks assessed Arousal/Alertness: Awake/alert Orientation Level: Oriented X4 Attention: Selective Focused Attention: Appears intact Sustained Attention: Appears intact Selective Attention: Appears intact Memory: Appears intact Awareness: Appears intact Problem Solving: Appears intact Sensation Sensation Light Touch: Appears Intact Stereognosis: Not tested Hot/Cold:  Appears Intact Coordination Coordination and Movement Description: intact finger opposition, mildly decreased speed on L compared to R Finger Nose Finger Test: intact, L mildly slower than R Heel Shin Test: decreased speed/accuracy on L Motor  Motor Motor: Hemiplegia;Abnormal postural alignment and control Motor - Discharge Observations: improving LLE control Mobility  Bed Mobility Bed Mobility: Sit to Supine;Supine to Sit;Sitting - Scoot to Edge of Bed Supine to Sit: 6: Modified independent (Device/Increase time) Sitting - Scoot to Edge of Bed: 6: Modified independent (Device/Increase time) Sit to Supine: 6: Modified independent (Device/Increase time) Transfers Sit to Stand: 6: Modified independent (Device/Increase time) Stand to Sit: 6: Modified independent (Device/Increase time)  Trunk/Postural Assessment  Cervical Assessment Cervical Assessment: Within Functional Limits Thoracic Assessment Thoracic Assessment: Within Functional Limits Lumbar Assessment Lumbar Assessment: Within Functional Limits  Balance Balance Balance Assessed: Yes Standardized Balance Assessment Standardized Balance Assessment: Berg Balance Test Berg Balance Test Sit to Stand: Able to stand  independently using hands Standing Unsupported: Able to stand safely 2 minutes (pt with increased postural sway but reports feeling steady and not like he will fall) Sitting with Back Unsupported but Feet Supported on Floor or Stool: Able to sit safely and securely 2 minutes Stand to Sit: Controls descent by using hands Transfers: Able to transfer safely, definite need of hands Standing Unsupported with Eyes Closed: Able to stand 10 seconds with supervision Standing Ubsupported with Feet Together: Able to place feet together independently and stand for 1 minute with supervision From Standing, Reach Forward with Outstretched Arm: Can reach forward >12 cm safely (5") From Standing Position, Pick up Object from Floor:  Able to pick up shoe, needs supervision From Standing Position, Turn to Look Behind Over each Shoulder: Looks behind from both sides and weight shifts well Turn 360 Degrees: Needs close supervision or verbal cueing Standing Unsupported, Alternately Place Feet on Step/Stool: Able to complete >  2 steps/needs minimal assist Standing Unsupported, One Foot in Front: Able to plae foot ahead of the other independently and hold 30 seconds (pt with increased postural sway and reports he feels like he is going to fall) Standing on One Leg: Able to lift leg independently and hold > 10 seconds Total Score: 42 Dynamic Sitting Balance Dynamic Sitting - Balance Support: No upper extremity supported;Feet unsupported;During functional activity Dynamic Sitting - Level of Assistance: 6: Modified independent (Device/Increase time) Static Standing Balance Static Standing - Balance Support: No upper extremity supported Static Standing - Level of Assistance: 6: Modified independent (Device/Increase time) Dynamic Standing Balance Dynamic Standing - Balance Support: Right upper extremity supported;Left upper extremity supported Dynamic Standing - Level of Assistance: 4: Min assist Extremity/Trunk Assessment RUE Assessment RUE Assessment: Within Functional Limits LUE Assessment LUE Assessment: Exceptions to WFL LUE AROM (degrees) Overall AROM Left Upper Extremity: Within functional limits for tasks assessed LUE Strength LUE Overall Strength: Deficits LUE Overall Strength Comments: 4-/5   See Function Navigator for Current Functional Status.  Phineas Semen 03/05/2015, 5:01 PM

## 2015-03-05 NOTE — Progress Notes (Signed)
Physical Therapy Discharge Summary  Patient Details  Name: [name] MRN: [id number] Date of Birth: 1965-11-12  Today's Date: 03/05/2015 PT Individual Time: 1300-1403 PT Individual Time Calculation (min): 63 min    Patient has met 10 of 10 long term goals due to improved activity tolerance, improved balance, improved postural control and improved awareness.  Patient to discharge at an ambulatory level Modified Independent.   Patient's care partner is independent to provide the necessary supervision assistance at discharge.   Recommendation:  Patient will benefit from ongoing skilled PT services in home health setting to continue to advance safe functional mobility, address ongoing impairments in strength, balance, and coordination, and minimize fall risk.  Equipment: RW  Reasons for discharge: treatment goals met  Patient/family agrees with progress made and goals achieved: Yes   Skilled Therapeutic Intervention: Pt received resting in w/c and agreeable to therapy session.  Pt propelled w/c to therapy gym mod I for UE strengthening and overall endurance.  Gait training [phone] with RW to assess PLS with heel lift.  Transfers throughout session mod I with RW.  PT administered BERG and patient demonstrates significant fall risk as noted by score of   42/56 on Berg Balance Scale.  PT reviewed and interpreted results for pt.  Recommending use of RW at all times at d/c to minimize fall risk.  Pt verbalized understanding.  Pt returned to room at end of session, amb 150' with RW mod I and positioned with w/c with call bell in reach and needs met.   PT Discharge Pain Pain Assessment Pain Assessment: No/denies pain   Cognition Overall Cognitive Status: Within Functional Limits for tasks assessed Arousal/Alertness: Awake/alert Orientation Level: Oriented X4 Focused Attention: Appears intact Sustained Attention: Appears intact Selective Attention: Appears intact Memory: Appears  intact Awareness: Appears intact Problem Solving: Appears intact Sensation Sensation Light Touch: Appears Intact (intact to LT) Coordination Coordination and Movement Description: intact finger opposition, mildly decreased speed on L compared to R Finger Nose Finger Test: intact, L mildly slower than R Heel Shin Test: decreased speed/accuracy on L Motor  Motor Motor: Hemiplegia;Abnormal postural alignment and control Motor - Discharge Observations: improving LLE control  Mobility Bed Mobility Bed Mobility: Sit to Supine;Supine to Sit;Sitting - Scoot to Edge of Bed Supine to Sit: 6: Modified independent (Device/Increase time) Sitting - Scoot to Edge of Bed: 6: Modified independent (Device/Increase time) Sit to Supine: 6: Modified independent (Device/Increase time) Transfers Transfers: Yes Sit to Stand: 6: Modified independent (Device/Increase time) Stand to Sit: 6: Modified independent (Device/Increase time) Squat Pivot Transfers: 6: Modified independent (Device/Increase time) Locomotion  Ambulation Ambulation: Yes Ambulation/Gait Assistance: 6: Modified independent (Device/Increase time) Ambulation Distance (Feet): 150 Feet Assistive device: Rolling walker Gait Gait: Yes Gait Pattern: Impaired Gait Pattern: Step-to pattern;Left steppage;Decreased hip/knee flexion - left;Decreased dorsiflexion - left Wheelchair Mobility Wheelchair Mobility: Yes Wheelchair Assistance: 6: Modified independent (Device/Increase time) Environmental health practitioner: Both upper extremities Wheelchair Parts Management: Independent Distance: 150  Trunk/Postural Assessment  Cervical Assessment Cervical Assessment: Within Functional Limits Thoracic Assessment Thoracic Assessment: Within Functional Limits Lumbar Assessment Lumbar Assessment: Within Functional Limits  Balance Balance Balance Assessed: Yes Standardized Balance Assessment Standardized Balance Assessment: Berg Balance Test Berg Balance  Test Sit to Stand: Able to stand  independently using hands Standing Unsupported: Able to stand safely 2 minutes (pt with increased postural sway but reports feeling steady and not like he will fall) Sitting with Back Unsupported but Feet Supported on Floor or Stool: Able to sit safely and  securely 2 minutes Stand to Sit: Controls descent by using hands Transfers: Able to transfer safely, definite need of hands Standing Unsupported with Eyes Closed: Able to stand 10 seconds with supervision Standing Ubsupported with Feet Together: Able to place feet together independently and stand for 1 minute with supervision From Standing, Reach Forward with Outstretched Arm: Can reach forward >12 cm safely (5") From Standing Position, Pick up Object from Floor: Able to pick up shoe, needs supervision From Standing Position, Turn to Look Behind Over each Shoulder: Looks behind from both sides and weight shifts well Turn 360 Degrees: Needs close supervision or verbal cueing Standing Unsupported, Alternately Place Feet on Step/Stool: Able to complete >2 steps/needs minimal assist Standing Unsupported, One Foot in Front: Able to plae foot ahead of the other independently and hold 30 seconds (pt with increased postural sway and reports he feels like he is going to fall) Standing on One Leg: Able to lift leg independently and hold > 10 seconds Total Score: 42 Extremity Assessment      RLE Assessment RLE Assessment: Within Functional Limits RLE Strength RLE Overall Strength Comments: 5/5 throughout LLE Assessment LLE Assessment: Exceptions to [facility] LLE Strength LLE Overall Strength Comments: hip flexion 3+/5, knee extension 3+/5, knee flexion 3+/5, DF 3/5, PF 2/5   See Function Navigator for Current Functional Status.  Dream Harman E Penven-Crew 03/05/2015, 3:33 PM

## 2015-03-05 NOTE — Discharge Summary (Signed)
Discharge summary job [phone]

## 2015-03-06 DIAGNOSIS — F329 Major depressive disorder, single episode, unspecified: Secondary | ICD-10-CM | POA: Insufficient documentation

## 2015-03-06 MED ORDER — LISINOPRIL 5 MG PO TABS: 5.0000 mg | ORAL_TABLET | Freq: Every day | ORAL | Status: DC

## 2015-03-06 MED ORDER — FOLIC ACID 1 MG PO TABS: 1.0000 mg | ORAL_TABLET | Freq: Every day | ORAL | Status: DC

## 2015-03-06 MED ORDER — MAGNESIUM OXIDE 400 (241.3 MG) MG PO TABS: 400.0000 mg | ORAL_TABLET | Freq: Every day | ORAL | Status: DC

## 2015-03-06 MED ORDER — ADULT MULTIVITAMIN W/MINERALS CH: 1.0000 | ORAL_TABLET | Freq: Every day | ORAL | Status: DC

## 2015-03-06 MED ORDER — FUROSEMIDE 40 MG PO TABS: 80.0000 mg | ORAL_TABLET | Freq: Every day | ORAL | Status: DC

## 2015-03-06 MED ORDER — SPIRONOLACTONE 25 MG PO TABS: 12.5000 mg | ORAL_TABLET | Freq: Every day | ORAL | Status: DC

## 2015-03-06 NOTE — Progress Notes (Signed)
[facility]     PROGRESS NOTE  Subjective/Complaints:  Patient seen sitting up in his chair this morning he is all color coordinated and eager to go home today.  ROS:  Denies CP, SOB, n/v/d.  Objective: Vital Signs: Blood pressure 144/88, pulse 74, temperature 98 F (36.7 C), temperature source Oral, resp. rate 18, weight 66.2 kg (145 lb 15.1 oz), SpO2 100 %. No results found. No results for input(s): WBC, HGB, HCT, PLT in the last 72 hours.  Recent Labs  03/05/15 0510  CREATININE 1.09   CBG (last 3)  No results for input(s): GLUCAP in the last 72 hours.  Wt Readings from Last 3 Encounters:  03/06/15 66.2 kg (145 lb 15.1 oz)  02/26/15 67.631 kg (149 lb 1.6 oz)  12/06/14 68.856 kg (151 lb 12.8 oz)    Physical Exam:  BP 144/88 mmHg  Pulse 74  Temp(Src) 98 F (36.7 C) (Oral)  Resp 18  Wt 66.2 kg (145 lb 15.1 oz)  SpO2 100% Constitutional: He appears well-developed and well-nourished. NAD HENT: Normocephalic and atraumatic.  Eyes: EOM are normal. Right eye exhibits no discharge. Left eye exhibits no discharge.  Cardiovascular: Normal rate and regular rhythm.  Respiratory: Effort normal and breath sounds normal. No respiratory distress.  GI: Soft. Bowel sounds are normal. He exhibits no distension.  Musculoskeletal: He exhibits no edema or tenderness.  Neurological: He is alert and oriented.  Speech mildly dysarthric.  He follows simple commands.  Awareness of deficits Motor: RUE/RLE: 5/5 proximal distal LUE: 4+/5 proximal distal LLE: Hip flexion, knee extension 4-4+/5, ankle dorsi/plantarflexion 4/5  Skin: Skin is warm and dry.  Psychiatric: He has a normal mood and affect. His behavior is normal  Assessment/Plan: 1. Functional deficits secondary to right MCA infarct which require 3+ hours per day of interdisciplinary therapy in a comprehensive inpatient rehab setting. Physiatrist is providing close team supervision and 24  hour management of active medical problems listed below. Physiatrist and rehab team continue to assess barriers to discharge/monitor patient progress toward functional and medical goals.  Function:  Bathing Bathing position   Position: Shower  Bathing parts Body parts bathed by patient: Right arm, Left arm, Left upper leg, Right lower leg, Left lower leg, Abdomen, Chest, Front perineal area, Buttocks, Right upper leg Body parts bathed by helper: Back  Bathing assist Assist Level: More than reasonable time      Upper Body Dressing/Undressing Upper body dressing   What is the patient wearing?: Pull over shirt/dress     Pull over shirt/dress - Perfomed by patient: Thread/unthread right sleeve, Thread/unthread left sleeve, Put head through opening, Pull shirt over trunk          Upper body assist Assist Level: More than reasonable time      Lower Body Dressing/Undressing Lower body dressing   What is the patient wearing?: Underwear, Pants, Socks, Shoes, AFO Underwear - Performed by patient: Thread/unthread right underwear leg, Thread/unthread left underwear leg, Pull underwear up/down   Pants- Performed by patient: Thread/unthread right pants leg, Thread/unthread left pants leg, Pull pants up/down, Fasten/unfasten pants       Socks - Performed by patient: Don/doff right sock, Don/doff left sock       AFO - Performed by patient: Don/doff left AFO        Lower body assist Assist for lower body dressing: More than reasonable time      Toileting Toileting   Toileting steps completed by patient: Adjust clothing  prior to toileting, Performs perineal hygiene, Adjust clothing after toileting   Toileting Assistive Devices: Grab bar or rail  Toileting assist Assist level: More than reasonable time   Transfers Chair/bed transfer   Chair/bed transfer method: Stand pivot, Ambulatory Chair/bed transfer assist level: No Help, no cues, assistive device, takes more than a  reasonable amount of time Chair/bed transfer assistive device: Armrests, Patent attorney     Max distance: 150  Assist level: No help, No cues, assistive device, takes more than a reasonable amount of time   Wheelchair   Type: Manual Max wheelchair distance: 150 Assist Level: No help, No cues, assistive device, takes more than reasonable amount of time  Cognition Comprehension Comprehension assist level: Follows basic conversation/direction with no assist  Expression Expression assist level: Expresses basic needs/ideas: With no assist  Social Interaction Social Interaction assist level: Interacts appropriately 90% of the time - Needs monitoring or encouragement for participation or interaction.  Problem Solving Problem solving assist level: Solves complex 90% of the time/cues < 10% of the time  Memory Memory assist level: Recognizes or recalls 90% of the time/requires cueing < 10% of the time    Medical Problem List and Plan: 1. Left-sided weakness and dysarthria secondary to right MCA infarct on 02/24/15  DC today 2. DVT Prophylaxis/Anticoagulation: Subcutaneous Lovenox. Monitor platelet counts or signs of bleeding 3. Pain Management: Tylenol as needed 4. Hypertension. Aldactone 12.5 mg daily. Monitor with increased mobility  Lisinopril back to home 20 on 2/13  HR labile, will have patient follow-up as outpatient for not allopotential initiation of BB  5. Neuropsych: This patient is capable of making decisions on his own behalf. 6. Skin/Wound Care: Routine skin checks 7. Fluids/Electrolytes/Nutrition: Routine I&O's  Eating 100% of his meals  BMP within relatively acceptable range on 2/7 8. Alcohol/polysubstance abuse.   Provide counseling  CIWA 9.NICM. Echocardiogram with ejection fraction of 20%. Monitor with increased mobility 10. Diastolic congestive heart failure. Weigh patient daily monitor for any signs of fluid overload. 11. ABLA: Resolved  Hb 13.6  on 2/7  LOS (Days) 8 A FACE TO FACE EVALUATION WAS PERFORMED  Ankit Karis Juba 03/06/2015 8:49 AM

## 2015-03-06 NOTE — Progress Notes (Signed)
Patient advised about why he should receive the Flu and Pneumonia vaccines. Patient declined both.

## 2015-03-06 NOTE — Discharge Summary (Signed)
NAMEYVENS, STOBER NO.:  [id number]  MEDICAL RECORD NO.:  [id number]  LOCATION:  [id number]                        FACILITY:  [facility]  PHYSICIAN:  Maryla Morrow, MD        DATE OF BIRTH:  Dec 01, 1965  DATE OF ADMISSION:  02/26/2015 DATE OF DISCHARGE:  03/06/2015                              DISCHARGE SUMMARY   DISCHARGE DIAGNOSES: 1. Left-sided weakness and dysarthria secondary to right middle     cerebral artery infarct. 2. Subcutaneous Lovenox for DVT prophylaxis. 3. Pain management. 4. Alcohol polysubstance abuse. 5. Nonischemic cardiomyopathy. 6. Diastolic congestive heart failure. 7. Acute blood loss anemia, resolved.  HISTORY OF PRESENT ILLNESS:  This is a 50 year old right-handed male with history of hypertension, chronic headaches, nonischemic cardiomyopathy, diastolic congestive heart failure, tobacco and polysubstance abuse.  He lives alone, independent prior to admission, has a fiancee.  Presented on February 24, 2015 with altered mental status as well as slurred speech and left-sided weakness.  MRI of the brain showed small acute cortical based infarct, lateral right MCA territory without hemorrhage.  Alcohol level 214.  Urine drug screen positive for cocaine.  Echocardiogram with ejection fraction of 20%, severe diffuse hypokinesis, grade 1 diastolic dysfunction.  Carotid Dopplers with no ICA stenosis.  EEG consistent with encephalopathy.  No seizure activity. The patient did not receive tPA.  Neurology Services consulted, placed on aspirin for CVA prophylaxis.  Subcutaneous Lovenox for DVT prophylaxis.  Tolerating a regular diet.  Physical and occupational therapy ongoing.  The patient was admitted for a comprehensive rehab program.  PAST MEDICAL HISTORY:  See discharge diagnoses.  SOCIAL HISTORY:  He lives alone.  He has a fiancee.  Independent prior to admission.  FUNCTIONAL STATUS:  Upon admission to rehab services:  Minimal assist, ambulate  60 feet rolling walker; min assist, sit to supine; min assist, activities of daily living.  PHYSICAL EXAMINATION:  VITAL SIGNS:  Blood pressure 152/89, pulse 72, temperature 98, respirations 17. GENERAL:  This was an alert male, oriented x3.  Well developed. LUNGS:  Clear to auscultation without wheeze. CARDIAC:  Regular rate and rhythm without murmur. ABDOMEN:  Soft, nontender.  Good bowel sounds.  Speech was a bit dysarthric, but fully intelligible.  REHABILITATION HOSPITAL COURSE:  The patient was admitted to inpatient rehab services with therapies initiated on a 3-hour daily basis consisting of physical therapy, occupational therapy, speech therapy, and rehabilitation nursing.  The following issues were addressed during the patient's rehabilitation stay.  Pertaining to Mr. Grennan's left- sided weakness, dysarthric speech remained stable, he continued on aspirin therapy.  Would follow up with Neurology Services.  Blood pressure monitored.  Lisinopril decreased to 5 mg to allow permissive hypertension.  Resume home dose as necessary.  He also continued on Aldactone.  Noted history of nonischemic cardiomyopathy, diastolic congestive heart failure.  He remained on diuretic, exhibiting no signs of fluid overload.  No chest pain or shortness of breath.  Noted history of alcohol, polysubstance abuse with positive urine drug screen. Provided counseling in regards to cessation of all illicit drug products, alcohol and tobacco.  It was questionable if he would be compliant with these requests.  Tolerating a regular  consistency diet. The patient received weekly collaborative interdisciplinary team conferences to discuss estimated length of stay, family teaching, and any barriers to discharge.  Sessions focused on functional mobility, endurance, floor transfers, stair training.  Sit to stand from wheelchair with supervision rolling walker, ambulating to the Orthopedic gym with supervision.   Instructed the patient on car transfers with supervision rolling walker.  The patient ambulates to the therapy gym with supervision of physical therapy and instructed on floor transfers with minimal assistance supervision.  He could gather his belongings for activities of daily living and homemaking.  Speech therapy follow up for any cognitive issues.  Provided skilled education, provided handouts of memory compensatory strategies to maximize carry over of new information in the home environment.  The patient was currently modified independent for basic to semi-complex tasks and has returned to baseline for cognition.  He was discharged to home.  DISCHARGE MEDICATIONS:  Included: 1. Aspirin 325 mg p.o. daily. 2. Folic acid 1 mg p.o. daily. 3. Lasix 80 mg p.o. daily. 4. Lisinopril 20 mg p.o. daily. 5. Magnesium oxide 400 mg p.o. b.i.d. 6. Multivitamin daily. 7. Aldactone 12.5 mg p.o. daily.  DIET:  Regular.  FOLLOWUP:  He would follow up with Dr. Maryla Morrow at the outpatient rehab office as directed; Dr. Delia Heady in 1 month, Neurology Service, call for appointment.  Case management to arrange medical follow up.     Mariam Dollar, P.A.   ______________________________ Maryla Morrow, MD    DA/MEDQ  D:  03/05/2015  T:  03/05/2015  Job:  [id number]  cc:   Pramod P. Pearlean Brownie, MD

## 2015-03-06 NOTE — Discharge Instructions (Signed)
Inpatient Rehab Discharge Instructions  [name] Discharge date and time: No discharge date for patient encounter.   Activities/Precautions/ Functional Status: Activity: activity as tolerated Diet: regular diet Wound Care: none needed Functional status:  ___ No restrictions     ___ Walk up steps independently ___ 24/7 supervision/assistance   ___ Walk up steps with assistance ___ Intermittent supervision/assistance  ___ Bathe/dress independently ___ Walk with walker     __x_ Bathe/dress with assistance ___ Walk Independently    ___ Shower independently _x__ Walk with assistance    ___ Shower with assistance ___ No alcohol     ___ Return to work/school ________     COMMUNITY REFERRALS UPON DISCHARGE:    Home Health:   PT     OT                         Agency:  [facility] Phone: [phone]   Medical Equipment/Items Ordered:  Rolling walker, tub bench                                                      Agency/Supplier:  [facility] @ [phone]        Special Instructions:    My questions have been answered and I understand these instructions. I will adhere to these goals and the provided educational materials after my discharge from the hospital.  Patient/Caregiver Signature _______________________________ Date __________  Clinician Signature _______________________________________ Date __________  Please bring this form and your medication list with you to all your follow-up doctor's appointments.

## 2015-03-06 NOTE — Progress Notes (Signed)
Patient discharged to home

## 2015-03-06 NOTE — Care Management Note (Signed)
Inpatient Rehabilitation Center Individual Statement of Services  Patient Name:  [name]  Date:  03/01/2015  Welcome to the Inpatient Rehabilitation Center.  Our goal is to provide you with an individualized program based on your diagnosis and situation, designed to meet your specific needs.  With this comprehensive rehabilitation program, you will be expected to participate in at least 3 hours of rehabilitation therapies Monday-Friday, with modified therapy programming on the weekends.  Your rehabilitation program will include the following services:  Physical Therapy (PT), Occupational Therapy (OT), Speech Therapy (ST), 24 hour per day rehabilitation nursing, Therapeutic Recreaction (TR), Neuropsychology, Case Management (Social Worker), Rehabilitation Medicine, Nutrition Services and Pharmacy Services  Weekly team conferences will be held on Wednesdays to discuss your progress.  Your Social Worker will talk with you frequently to get your input and to update you on team discussions.  Team conferences with you and your family in attendance may also be held.  Expected length of stay: 7-9 days  Overall anticipated outcome: modified independent  Depending on your progress and recovery, your program may change. Your Social Worker will coordinate services and will keep you informed of any changes. Your Social Worker's name and contact numbers are listed  below.  The following services may also be recommended but are not provided by the Inpatient Rehabilitation Center:   Driving Evaluations  Home Health Rehabiltiation Services  Outpatient Rehabilitation Services  Vocational Rehabilitation   Arrangements will be made to provide these services after discharge if needed.  Arrangements include referral to agencies that provide these services.  Your insurance has been verified to be:  None (Medicaid application pending) Your primary doctor is:  [facility]  Pertinent information will be shared with your doctor and your insurance company.  Social Worker:  [place], [place] [phone] or [phone]   Information discussed with and copy given to patient by: Amada Jupiter, 03/01/2015, 3:40 PM

## 2015-03-06 NOTE — Consult Note (Signed)
INITIAL DIAGNOSTIC EVALUATION - [facility]   MEDICAL NECESSITY:  [name] was seen on the [facility] Unit for an initial diagnostic evaluation owing to the patient's diagnosis of cerebral infarction.   Records indicate that [name] is a "50 y.o. right handed male with history of hypertension, chronic headaches, NICM, diastolic congestive heart failure, tobacco abuse and polysubstance abuse. Patient lives alone independently prior to admission. He has a fiance that can assist on discharge. Presented 02/24/2015 with altered mental status as well as slurred speech and left-sided weakness. MRI of the brain showed small acute cortical-based infarct lateral right MCA territory without hemorrhage. Alcohol level 214. Urine drug screen Positive cocaine. Echocardiogram with ejection fraction of 20% severe diffuse hypokinesis with grade 1 diastolic dysfunction. Carotid Dopplers with no ICA stenosis. EEG consistent with encephalopathy no seizure activity. Patient did not receive TPA. Neurology consulted maintained on aspirin for CVA prophylaxis. Subcutaneous Lovenox for DVT prophylaxis."  During today's visit, [name] endorsed suffering from pre-existing memory loss that he thought coincided with the onset of heart failure. Post-stroke he has noted slowed processing speed and slurred speech as well as decreased strength and balance, with most of his motor issues on the left side.  [name] has a significant polysubstance abuse/dependence history. His heart failure is secondary to alcoholism and drug use. He has participated in detox inpatient programs in the past but could not elaborate on specifics. He reportedly was abstinent from drugs and alcohol until a stressful work-related situation occurred. 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 He started binge drinking after that job and  was also taking multiple benzodiazepines several times per day. He is currently not connected with any substance abuse program and has no sponsor and does not attend AA or NA. He expressed disinterest in participation in a substance abuse program upon discharge. He mentioned having a god-uncle who is a former drug user who he can go to for support.  From an emotional standpoint, [name] has a significant history of depression and anxiety that he believes is related to his father (who raised him) and who has been in jail for most of the patient's life. He could be getting parole soon. Patient described being "depressed all the time" and described symptoms similar to those with Bipolar disorder but this is difficult to ascertain given his substance abuse history. He has been under a lot of stress of late and became tearful during this visit. For example, he recently got an argument with his fianc and she will not be assisting with his transfer home. Instead, his sister and one brother will help with the transition home. No major adjustment issues endorsed. Suicidal/homicidal ideation, plan or intent was denied. The patient denied ever experiencing any auditory/visual hallucinations. No major behavioral or personality changes were endorsed.   Patient feels that progress is being made in therapy. No barriers to therapy identified. He has been satisfied with the rehabilitation staff. He is happy to be discharging soon.  PROCEDURES: [1 unit 90791] Diagnostic clinical interview  Review of available records  IMPRESSION: [name] endorsed experiencing significant cognitive and affective symptoms. Many of these issues existed prior to his recent stroke. He also has a significant psychiatric and polysubstance abuse history. I encouraged use of antidepressant medication which could be initiated prior to discharge. This was discussed with his Child psychotherapist. We plan to also encourage intensive outpatient  mental  health counseling as well as a substance abuse program in addition to obtaining a sponsor. A neuropsychological evaluation at some point upon discharge would also be viable but is not a priority at this point. Given that he is set to discharge tomorrow, neuropsychology does not plan to follow-up anymore prior to him leaving.     Debbe Mounts, Psy.D.  Clinical Neuropsychologist

## 2015-03-06 NOTE — Progress Notes (Signed)
Social Work  Discharge Note  The overall goal for the admission was met for:   Discharge location: Yes - home with fiance who can provide assistance as needed  Length of Stay: Yes - 8 days  Discharge activity level: Yes - modified independent  Home/community participation: Yes  Services provided included: MD, RD, PT, OT, SLP, RN, TR, Pharmacy, Neuropsych and SW  Financial Services: Medicaid and SSD apps still pending at time of d/c  Follow-up services arranged: Home Health: PT, OT via [facility], DME: rolling walker and tub bench via [facility] and Patient/Family has no preference for HH/DME agencies  Comments (or additional information):  Patient/Family verbalized understanding of follow-up arrangements: Yes  Individual responsible for coordination of the follow-up plan: pt  Confirmed correct DME delivered: Wanell Lorenzi 03/06/2015    Kenlea Woodell

## 2015-03-12 ENCOUNTER — Ambulatory Visit: Payer: Medicaid Other | Attending: Physician Assistant | Admitting: Physician Assistant

## 2015-03-12 ENCOUNTER — Ambulatory Visit (HOSPITAL_BASED_OUTPATIENT_CLINIC_OR_DEPARTMENT_OTHER): Payer: Self-pay | Admitting: Clinical

## 2015-03-12 VITALS — BP 169/101 | HR 74 | Temp 97.8°F | Resp 18 | Ht 68.0 in | Wt 150.0 lb

## 2015-03-12 DIAGNOSIS — F1910 Other psychoactive substance abuse, uncomplicated: Secondary | ICD-10-CM | POA: Diagnosis not present

## 2015-03-12 DIAGNOSIS — I639 Cerebral infarction, unspecified: Secondary | ICD-10-CM | POA: Insufficient documentation

## 2015-03-12 DIAGNOSIS — I5042 Chronic combined systolic (congestive) and diastolic (congestive) heart failure: Secondary | ICD-10-CM | POA: Diagnosis not present

## 2015-03-12 DIAGNOSIS — F17210 Nicotine dependence, cigarettes, uncomplicated: Secondary | ICD-10-CM | POA: Diagnosis not present

## 2015-03-12 DIAGNOSIS — I10 Essential (primary) hypertension: Secondary | ICD-10-CM | POA: Diagnosis not present

## 2015-03-12 DIAGNOSIS — Z880 Allergy status to penicillin: Secondary | ICD-10-CM | POA: Insufficient documentation

## 2015-03-12 DIAGNOSIS — Z79899 Other long term (current) drug therapy: Secondary | ICD-10-CM | POA: Diagnosis not present

## 2015-03-12 DIAGNOSIS — F419 Anxiety disorder, unspecified: Secondary | ICD-10-CM | POA: Insufficient documentation

## 2015-03-12 DIAGNOSIS — F4323 Adjustment disorder with mixed anxiety and depressed mood: Secondary | ICD-10-CM

## 2015-03-12 DIAGNOSIS — Z7982 Long term (current) use of aspirin: Secondary | ICD-10-CM | POA: Insufficient documentation

## 2015-03-12 DIAGNOSIS — I63311 Cerebral infarction due to thrombosis of right middle cerebral artery: Secondary | ICD-10-CM | POA: Diagnosis not present

## 2015-03-12 MED ORDER — FLUOXETINE HCL 20 MG PO TABS: 20.0000 mg | ORAL_TABLET | Freq: Every day | ORAL | Status: DC

## 2015-03-12 MED ORDER — NICOTINE 14 MG/24HR TD PT24: 14.0000 mg | MEDICATED_PATCH | Freq: Every day | TRANSDERMAL | Status: DC

## 2015-03-12 MED ORDER — METHOCARBAMOL 500 MG PO TABS: 500.0000 mg | ORAL_TABLET | Freq: Three times a day (TID) | ORAL | Status: DC

## 2015-03-12 MED ORDER — CARVEDILOL 3.125 MG PO TABS: 3.1250 mg | ORAL_TABLET | Freq: Two times a day (BID) | ORAL | Status: DC

## 2015-03-12 MED FILL — FUROSEMIDE 40 MG TABLET: 40 | 30 days supply | Qty: 60 | Fill #1

## 2015-03-12 MED FILL — ?FLUOXETINE HCL 20MG TABLET: 20 | 30 days supply | Qty: 30 | Fill #0

## 2015-03-12 MED FILL — ?CARVEDILOL 3.125 MG TABLET: 3.125 | 30 days supply | Qty: 60 | Fill #0

## 2015-03-12 MED FILL — FOLIC ACID 1 MG TABLET: 1 | 30 days supply | Qty: 30 | Fill #0

## 2015-03-12 MED FILL — LISINOPRIL 5 MG TABLET: 5 | 30 days supply | Qty: 30 | Fill #0

## 2015-03-12 MED FILL — METHOCARBAMOL 500 MG TABLET: 500 | 30 days supply | Qty: 90 | Fill #0

## 2015-03-12 MED FILL — SPIRONOLACTONE 25 MG TABLET: 25 | 30 days supply | Qty: 15 | Fill #0

## 2015-03-12 MED FILL — Magnesium Oxide 400mg table: 30 days supply | Qty: 30 | Fill #0

## 2015-03-12 NOTE — Progress Notes (Signed)
Patient's here for hospital f/up for stroke.   Patient reports feeling good this morning with not further concerns.   Patient decline the flu shot, and completed an A1c screening this month.  Patient BP was elevated. Patient states he took his BP meds around  7 a.m. this morning. Will recheck BP prior to d/c.

## 2015-03-12 NOTE — Progress Notes (Signed)
ASSESSMENT: Pt currently experiencing Adjustment disorder with mixed anxiety and depressed mood. Pt needs to f/u with PCP and [facility], and may benefit from psychoeducation and Motivational Interviewing to cope with symptoms of anxiety and depression.  Stage of Change: precontemplative  PLAN: 1. F/U with behavioral health consultant in two weeks 2. Psychiatric Medications: Prozac. 3. Behavioral recommendation(s):   -Take food resources home today, and consider upcoming food bank program at [facility] -Consider reading educational material regarding coping with symptoms of anxiety and depression  SUBJECTIVE: Pt. referred by Scot Jun, PA-C for symptoms of anxiety and depression:  Pt. reports the following symptoms/concerns: Pt states that he started feeling anxiety and depression after congestive heart failure, and increased after stroke. Pt says that primary concern is losing his independence, and not being able to get around more; unsure where his Medicaid application is in process (started in hospital), needs Medicaid to continue physical therapy, to increase independence.  Duration of problem: Less than one year, increase in past month (stroke on 02-26-15) Severity: severe  OBJECTIVE: Orientation & Cognition: Oriented x3. Thought processes normal and appropriate to situation. Mood: appropriate. Affect: appropriate Appearance: shaky Risk of harm to self or others: no known risk of harm to self or others Substance use: none Assessments administered: PHQ9: 22/ GAD7: 19  Diagnosis: Adjustment disorder with mixed anxiety and depressed mood CPT Code: F43.23 -------------------------------------------- Other(s) present in the room: sister, Delaney Meigs  Time spent with patient in exam room: 20 minutes   Depression screen [facility] 2/9 03/12/2015 03/12/2015 12/06/2014 09/27/2014 05/24/2014  Decreased Interest 3 3 2  0 3  Down, Depressed, Hopeless 3 3 1  0 2  PHQ - 2 Score 6 6 3  0 5  Altered sleeping 0 - 0 - -   Tired, decreased energy 3 - 0 - -  Change in appetite 3 - 0 - -  Feeling bad or failure about yourself  3 - 0 - -  Trouble concentrating 3 - 0 - -  Moving slowly or fidgety/restless 3 - 1 - -  Suicidal thoughts 1 - 0 - -  PHQ-9 Score 22 - 4 - -    GAD 7 : Generalized Anxiety Score 03/12/2015  Nervous, Anxious, on Edge 3  Control/stop worrying 3  Worry too much - different things 3  Trouble relaxing 3  Restless 3  Easily annoyed or irritable 3  Afraid - awful might happen 1  Total GAD 7 Score 19

## 2015-03-12 NOTE — Progress Notes (Signed)
[name]  INO:[id number]  [id number]:[id number]  DOB - [birth year]  Chief Complaint  Patient presents with  . Hospitalization Follow-up       Subjective:   [name] is a 50 y.o. male here today for hospital follow up. He has a  history of hypertension, chronic headaches, nonischemic cardiomyopathy, combined systolic and diastolic congestive heart failure, tobacco and polysubstance abuse, and anxiety mixed with depression. Presented on February 24, 2015 with altered mental status as well as slurred speech and left-sided weakness.He was hypoxic and combative requiring intubation. CT head initially showed no acute process.  MRI of the brain showed small acute cortical based infarct, lateral right MCA territory without hemorrhage. Alcohol level 214. Urine drug screen positive for cocaine. Echocardiogram with ejection fraction of 20%, severe diffuse hypokinesis, grade 1 diastolic dysfunction. Carotid Dopplers with no ICA stenosis. EEG consistent with encephalopathy. No seizure activity. The patient did not receive tPA. Neurology Services consulted, placed on aspirin for CVA prophylaxis. Subcutaneous Lovenox for DVT Prophylaxis through his rehab stay.  Transtioned to rehab from 02/26/15 -03/06/15. He is . He stated he has been taking his medications. He has not been smoking. He has not been drinking alcohol. Not been using any illicit drugs. He states he is around at all time he could use some additional help as far as the cigarette smoking. He is not driving. He has morning headaches but this is not new. He denies chest pain or palpitations. He feels that his speech is improving. He is walking he is improving. He still unsteady and needs a walker. He had a representative from [facility] come out last week. There is an issue with his Medicaid and he is not sure if he'll be able to continue to come out.  ROS: GEN: denies fever or chills, denies change in  weight Skin: denies lesions or rashes HEENT: + headache, earache, epistaxis, sore throat, or neck pain LUNGS: denies SHOB, dyspnea, PND, orthopnea CV: denies CP or palpitations ABD: denies abd pain, N or V EXT: denies muscle spasms or swelling; no pain in lower ext, + weakness NEURO: denies numbness or tingling, denies sz, +stroke or TIA  ALLERGIES: Allergies  Allergen Reactions  . Penicillins Other (See Comments)    Childhood     PAST MEDICAL HISTORY: Past Medical History  Diagnosis Date  . Hypertension   . Headache   . CHF (congestive heart failure) (HCC)     PAST SURGICAL HISTORY: Past Surgical History  Procedure Laterality Date  . No past surgeries      MEDICATIONS AT HOME: Prior to Admission medications   Medication Sig Start Date End Date Taking? Authorizing Provider  aspirin 325 MG EC tablet Take 1 tablet (325 mg total) by mouth daily. 09/27/14  Yes Gaylord Shih, MD  fluticasone (FLONASE) 50 MCG/ACT nasal spray Place 1 spray into both nostrils daily. 02/26/15  Yes Ripudeep Jenna Luo, MD  folic acid (FOLVITE) 1 MG tablet Take 1 tablet (1 mg total) by mouth daily. 03/06/15  Yes Daniel J Angiulli, PA-C  furosemide (LASIX) 40 MG tablet Take 2 tablets (80 mg total) by mouth daily. 03/06/15  Yes Daniel J Angiulli, PA-C  lisinopril (PRINIVIL,ZESTRIL) 5 MG tablet Take 1 tablet (5 mg total) by mouth daily. 03/06/15  Yes Daniel J Angiulli, PA-C  magnesium oxide (MAG-OX) 400 (241.3 Mg) MG tablet Take 1 tablet (400 mg total) by mouth daily. 03/06/15  Yes Daniel J Angiulli, PA-C  Multiple Vitamin (MULTIVITAMIN WITH MINERALS)  TABS tablet Take 1 tablet by mouth daily. 03/06/15  Yes Daniel J Angiulli, PA-C  spironolactone (ALDACTONE) 25 MG tablet Take 0.5 tablets (12.5 mg total) by mouth daily. 03/06/15  Yes Daniel J Angiulli, PA-C  carvedilol (COREG) 3.125 MG tablet Take 1 tablet (3.125 mg total) by mouth 2 (two) times daily with a meal. 03/12/15   Vivianne Master, PA-C  FLUoxetine (PROZAC) 20 MG  tablet Take 1 tablet (20 mg total) by mouth daily. 03/12/15   Vivianne Master, PA-C  methocarbamol (ROBAXIN) 500 MG tablet Take 1 tablet (500 mg total) by mouth 3 (three) times daily. 03/12/15   Vivianne Master, PA-C  nicotine (NICODERM CQ - DOSED IN MG/24 HOURS) 14 mg/24hr patch Place 1 patch (14 mg total) onto the skin daily. 03/12/15   Vivianne Master, PA-C     Objective:   Filed Vitals:   03/12/15 0944  BP: 169/101  Pulse: 74  Temp: 97.8 F (36.6 C)  TempSrc: Oral  Resp: 18  Height:  (1.727 m)  Weight: 150 lb (68.04 kg)  SpO2: 98%    Exam General appearance : Awake, alert, not in any distress. Speech Clear. Not toxic looking HEENT: Atraumatic and Normocephalic, pupils equally reactive to light and accomodation Neck: supple, no JVD. No cervical lymphadenopathy.  Chest:Good air entry bilaterally, no added sounds  CVS: S1 S2 regular, no murmurs.  Abdomen: Bowel sounds present, Non tender and not distended with no guarding, rigidity or rebound. Extremities: B/L Lower Ext shows no edema, both legs are warm to touch; decrease strength from upper and lower ext Neurology: Awake alert, and oriented X 3, CN II-XII somewhat intact, obvious deficits along left side Skin:No Rash Wounds:N/A  Data Review Lab Results  Component Value Date   HGBA1C 5.2 02/25/2015     Assessment & Plan  1. Acute Right MCA territory CVA  -RF modification  -Neuro appt in 1 month. Sister to schedule.  -rehab/ PT as insurance allows 2. Polysubstance abuse  -cessation reiterated  3. Snoker  -cessation reiterated  -Nicotine patches 4. HTN  -Lisinopril, add Coreg 5. Chronic combined systolic and diastolic heart failure  -Lisinopril, Spironolactone add Coreg  -low salt diet  -Lasix as prescribed  -establish care here when new [facility] doctor starts 6. Anxiety mixed with depression  -add Prozac  -SW aware   Return in about 2 weeks (around 03/26/2015). Total chart review and direct patient care time  49 minutes.   The patient was given clear instructions to go to ER or return to medical center if symptoms don't improve, worsen or new problems develop. The patient verbalized understanding. The patient was told to call to get lab results if they haven't heard anything in the next week.   This note has been created with Education officer, environmental. Any transcriptional errors are unintentional.    Scot Jun, PA-C [name] [place], [place] [phone]   03/12/2015, 10:14 AM

## 2015-03-23 ENCOUNTER — Encounter: Payer: Self-pay | Admitting: Family Medicine

## 2015-03-23 ENCOUNTER — Telehealth: Payer: Self-pay | Admitting: *Deleted

## 2015-03-23 ENCOUNTER — Ambulatory Visit: Payer: Medicaid Other | Attending: Family Medicine | Admitting: Family Medicine

## 2015-03-23 VITALS — BP 140/70 | HR 62 | Temp 98.7°F | Resp 16 | Ht 68.0 in | Wt 152.0 lb

## 2015-03-23 DIAGNOSIS — I10 Essential (primary) hypertension: Secondary | ICD-10-CM | POA: Insufficient documentation

## 2015-03-23 DIAGNOSIS — I5022 Chronic systolic (congestive) heart failure: Secondary | ICD-10-CM | POA: Diagnosis not present

## 2015-03-23 DIAGNOSIS — S39012A Strain of muscle, fascia and tendon of lower back, initial encounter: Secondary | ICD-10-CM | POA: Diagnosis not present

## 2015-03-23 DIAGNOSIS — M545 Low back pain: Secondary | ICD-10-CM | POA: Diagnosis not present

## 2015-03-23 DIAGNOSIS — R29898 Other symptoms and signs involving the musculoskeletal system: Secondary | ICD-10-CM | POA: Insufficient documentation

## 2015-03-23 DIAGNOSIS — Z8673 Personal history of transient ischemic attack (TIA), and cerebral infarction without residual deficits: Secondary | ICD-10-CM | POA: Insufficient documentation

## 2015-03-23 DIAGNOSIS — Z114 Encounter for screening for human immunodeficiency virus [HIV]: Secondary | ICD-10-CM | POA: Diagnosis not present

## 2015-03-23 DIAGNOSIS — I5042 Chronic combined systolic (congestive) and diastolic (congestive) heart failure: Secondary | ICD-10-CM | POA: Diagnosis not present

## 2015-03-23 DIAGNOSIS — Z79899 Other long term (current) drug therapy: Secondary | ICD-10-CM | POA: Insufficient documentation

## 2015-03-23 DIAGNOSIS — Z87891 Personal history of nicotine dependence: Secondary | ICD-10-CM | POA: Insufficient documentation

## 2015-03-23 DIAGNOSIS — M549 Dorsalgia, unspecified: Secondary | ICD-10-CM | POA: Insufficient documentation

## 2015-03-23 DIAGNOSIS — IMO0002 Reserved for concepts with insufficient information to code with codable children: Secondary | ICD-10-CM

## 2015-03-23 DIAGNOSIS — R790 Abnormal level of blood mineral: Secondary | ICD-10-CM

## 2015-03-23 DIAGNOSIS — I639 Cerebral infarction, unspecified: Secondary | ICD-10-CM

## 2015-03-23 DIAGNOSIS — I5030 Unspecified diastolic (congestive) heart failure: Secondary | ICD-10-CM | POA: Diagnosis not present

## 2015-03-23 DIAGNOSIS — S39012D Strain of muscle, fascia and tendon of lower back, subsequent encounter: Secondary | ICD-10-CM

## 2015-03-23 DIAGNOSIS — R531 Weakness: Secondary | ICD-10-CM

## 2015-03-23 DIAGNOSIS — Z7982 Long term (current) use of aspirin: Secondary | ICD-10-CM | POA: Diagnosis not present

## 2015-03-23 DIAGNOSIS — I69354 Hemiplegia and hemiparesis following cerebral infarction affecting left non-dominant side: Secondary | ICD-10-CM | POA: Diagnosis not present

## 2015-03-23 DIAGNOSIS — I63511 Cerebral infarction due to unspecified occlusion or stenosis of right middle cerebral artery: Secondary | ICD-10-CM

## 2015-03-23 LAB — BASIC METABOLIC PANEL
BUN: 17 mg/dL (ref 7–25)
CHLORIDE: 104 mmol/L (ref 98–110)
CO2: 29 mmol/L (ref 20–31)
Calcium: 9.3 mg/dL (ref 8.6–10.3)
Creat: 0.92 mg/dL (ref 0.60–1.35)
Glucose, Bld: 84 mg/dL (ref 65–99)
Potassium: 4 mmol/L (ref 3.5–5.3)
Sodium: 141 mmol/L (ref 135–146)

## 2015-03-23 LAB — MAGNESIUM: MAGNESIUM: 2 mg/dL (ref 1.5–2.5)

## 2015-03-23 MED ORDER — CYCLOBENZAPRINE HCL 10 MG PO TABS: 10.0000 mg | ORAL_TABLET | Freq: Three times a day (TID) | ORAL | Status: AC | PRN

## 2015-03-23 MED ORDER — ACETAMINOPHEN-CODEINE #3 300-30 MG PO TABS: 1.0000 | ORAL_TABLET | Freq: Three times a day (TID) | ORAL | Status: DC | PRN

## 2015-03-23 MED ORDER — LISINOPRIL 10 MG PO TABS: 10.0000 mg | ORAL_TABLET | Freq: Every day | ORAL | Status: DC

## 2015-03-23 MED FILL — CYCLOBENZAPRINE 10 MG TAB: 10 | 10 days supply | Qty: 30 | Fill #0

## 2015-03-23 MED FILL — ?LISINOPRIL 10 MG TABLET: 10 | 30 days supply | Qty: 30 | Fill #0

## 2015-03-23 MED FILL — ACETAMINOPHEN/COD #3 TABLET: 300-30 | 10 days supply | Qty: 60 | Fill #0

## 2015-03-23 NOTE — Progress Notes (Signed)
Patient's here for f/up stoke and to est. Care PCP.

## 2015-03-23 NOTE — Assessment & Plan Note (Signed)
A; chronic CHF P: BP control Fluid control Cards referral

## 2015-03-23 NOTE — Telephone Encounter (Signed)
Cathey [facility] OT [facility] called for VO for visits beginning last week 03/12/15   2wk4.  Approval given.

## 2015-03-23 NOTE — Assessment & Plan Note (Signed)
A; acute lumbar back strain P: Flexeril x 10 days Tylenol #3

## 2015-03-23 NOTE — Patient Instructions (Addendum)
[name] was seen today for follow-up.  Diagnoses and all orders for this visit:  Chronic systolic heart failure (HCC)  Accelerated hypertension -     Discontinue: lisinopril (PRINIVIL,ZESTRIL) 10 MG tablet; Take 1 tablet (10 mg total) by mouth daily. -     Discontinue: lisinopril (PRINIVIL,ZESTRIL) 10 MG tablet; Take 1 tablet (10 mg total) by mouth daily. -     lisinopril (PRINIVIL,ZESTRIL) 10 MG tablet; Take 1 tablet (10 mg total) by mouth daily.  Chronic combined systolic and diastolic congestive heart failure (HCC) -     Ambulatory referral to Cardiology  Low blood magnesium -     Magnesium  Screening for HIV (human immunodeficiency virus) -     HIV antibody (with reflex)  Lumbar strain, subsequent encounter -     acetaminophen-codeine (TYLENOL #3) 300-30 MG tablet; Take 1 tablet by mouth every 8 (eight) hours as needed for moderate pain. -     cyclobenzaprine (FLEXERIL) 10 MG tablet; Take 1 tablet (10 mg total) by mouth 3 (three) times daily as needed for muscle spasms (for 10 days).   F/u in 4 weeks for HTN and low back pain   Low Back Sprain With Rehab A sprain is an injury in which a ligament is torn. The ligaments of the lower back are vulnerable to sprains. However, they are strong and require great force to be injured. These ligaments are important for stabilizing the spinal column. Sprains are classified into three categories. Grade 1 sprains cause pain, but the tendon is not lengthened. Grade 2 sprains include a lengthened ligament, due to the ligament being stretched or partially ruptured. With grade 2 sprains there is still function, although the function may be decreased. Grade 3 sprains involve a complete tear of the tendon or muscle, and function is usually impaired. SYMPTOMS   Severe pain in the lower back.  Sometimes, a feeling of a "pop," "snap," or tear, at the time of injury.  Tenderness and sometimes swelling at the injury site.  Uncommonly, bruising  (contusion) within 48 hours of injury.  Muscle spasms in the back. CAUSES  Low back sprains occur when a force is placed on the ligaments that is greater than they can handle. Common causes of injury include:  Performing a stressful act while off-balance.  Repetitive stressful activities that involve movement of the lower back.  Direct hit (trauma) to the lower back. RISK INCREASES WITH:  Contact sports (football, wrestling).  Collisions (major skiing accidents).  Sports that require throwing or lifting (baseball, weightlifting).  Sports involving twisting of the spine (gymnastics, diving, tennis, golf).  Poor strength and flexibility.  Inadequate protection.  Previous back injury or surgery (especially fusion). PREVENTION  Wear properly fitted and padded protective equipment.  Warm up and stretch properly before activity.  Allow for adequate recovery between workouts.  Maintain physical fitness:  Strength, flexibility, and endurance.  Cardiovascular fitness.  Maintain a healthy body weight. PROGNOSIS  If treated properly, low back sprains usually heal with non-surgical treatment. The length of time for healing depends on the severity of the injury.  RELATED COMPLICATIONS   Recurring symptoms, resulting in a chronic problem.  Chronic inflammation and pain in the low back.  Delayed healing or resolution of symptoms, especially if activity is resumed too soon.  Prolonged impairment.  Unstable or arthritic joints of the low back. TREATMENT  Treatment first involves the use of ice and medicine, to reduce pain and inflammation. The use of strengthening and stretching exercises may  help reduce pain with activity. These exercises may be performed at home or with a therapist. Severe injuries may require referral to a therapist for further evaluation and treatment, such as ultrasound. Your caregiver may advise that you wear a back brace or corset, to help reduce pain and  discomfort. Often, prolonged bed rest results in greater harm then benefit. Corticosteroid injections may be recommended. However, these should be reserved for the most serious cases. It is important to avoid using your back when lifting objects. At night, sleep on your back on a firm mattress, with a pillow placed under your knees. If non-surgical treatment is unsuccessful, surgery may be needed.  MEDICATION   If pain medicine is needed, nonsteroidal anti-inflammatory medicines (aspirin and ibuprofen), or other minor pain relievers (acetaminophen), are often advised.  Do not take pain medicine for 7 days before surgery.  Prescription pain relievers may be given, if your caregiver thinks they are needed. Use only as directed and only as much as you need.  Ointments applied to the skin may be helpful.  Corticosteroid injections may be given by your caregiver. These injections should be reserved for the most serious cases, because they may only be given a certain number of times. HEAT AND COLD  Cold treatment (icing) should be applied for 10 to 15 minutes every 2 to 3 hours for inflammation and pain, and immediately after activity that aggravates your symptoms. Use ice packs or an ice massage.  Heat treatment may be used before performing stretching and strengthening activities prescribed by your caregiver, physical therapist, or athletic trainer. Use a heat pack or a warm water soak. SEEK MEDICAL CARE IF:   Symptoms get worse or do not improve in 2 to 4 weeks, despite treatment.  You develop numbness or weakness in either leg.  You lose bowel or bladder function.  Any of the following occur after surgery: fever, increased pain, swelling, redness, drainage of fluids, or bleeding in the affected area.  New, unexplained symptoms develop. (Drugs used in treatment may produce side effects.) EXERCISES  RANGE OF MOTION (ROM) AND STRETCHING EXERCISES - Low Back Sprain Most people with lower back  pain will find that their symptoms get worse with excessive bending forward (flexion) or arching at the lower back (extension). The exercises that will help resolve your symptoms will focus on the opposite motion.  Your physician, physical therapist or athletic trainer will help you determine which exercises will be most helpful to resolve your lower back pain. Do not complete any exercises without first consulting with your caregiver. Discontinue any exercises which make your symptoms worse, until you speak to your caregiver. If you have pain, numbness or tingling which travels down into your buttocks, leg or foot, the goal of the therapy is for these symptoms to move closer to your back and eventually resolve. Sometimes, these leg symptoms will get better, but your lower back pain may worsen. This is often an indication of progress in your rehabilitation. Be very alert to any changes in your symptoms and the activities in which you participated in the 24 hours prior to the change. Sharing this information with your caregiver will allow him or her to most efficiently treat your condition. These exercises may help you when beginning to rehabilitate your injury. Your symptoms may resolve with or without further involvement from your physician, physical therapist or athletic trainer. While completing these exercises, remember:   Restoring tissue flexibility helps normal motion to return to the joints. This  allows healthier, less painful movement and activity.  An effective stretch should be held for at least 30 seconds.  A stretch should never be painful. You should only feel a gentle lengthening or release in the stretched tissue. FLEXION RANGE OF MOTION AND STRETCHING EXERCISES: STRETCH - Flexion, Single Knee to Chest   Lie on a firm bed or floor with both legs extended in front of you.  Keeping one leg in contact with the floor, bring your opposite knee to your chest. Hold your leg in place by either  grabbing behind your thigh or at your knee.  Pull until you feel a gentle stretch in your low back. Hold __________ seconds.  Slowly release your grasp and repeat the exercise with the opposite side. Repeat __________ times. Complete this exercise __________ times per day.  STRETCH - Flexion, Double Knee to Chest  Lie on a firm bed or floor with both legs extended in front of you.  Keeping one leg in contact with the floor, bring your opposite knee to your chest.  Tense your stomach muscles to support your back and then lift your other knee to your chest. Hold your legs in place by either grabbing behind your thighs or at your knees.  Pull both knees toward your chest until you feel a gentle stretch in your low back. Hold __________ seconds.  Tense your stomach muscles and slowly return one leg at a time to the floor. Repeat __________ times. Complete this exercise __________ times per day.  STRETCH - Low Trunk Rotation  Lie on a firm bed or floor. Keeping your legs in front of you, bend your knees so they are both pointed toward the ceiling and your feet are flat on the floor.  Extend your arms out to the side. This will stabilize your upper body by keeping your shoulders in contact with the floor.  Gently and slowly drop both knees together to one side until you feel a gentle stretch in your low back. Hold for __________ seconds.  Tense your stomach muscles to support your lower back as you bring your knees back to the starting position. Repeat the exercise to the other side. Repeat __________ times. Complete this exercise __________ times per day  EXTENSION RANGE OF MOTION AND FLEXIBILITY EXERCISES: STRETCH - Extension, Prone on Elbows   Lie on your stomach on the floor, a bed will be too soft. Place your palms about shoulder width apart and at the height of your head.  Place your elbows under your shoulders. If this is too painful, stack pillows under your chest.  Allow your  body to relax so that your hips drop lower and make contact more completely with the floor.  Hold this position for __________ seconds.  Slowly return to lying flat on the floor. Repeat __________ times. Complete this exercise __________ times per day.  RANGE OF MOTION - Extension, Prone Press Ups  Lie on your stomach on the floor, a bed will be too soft. Place your palms about shoulder width apart and at the height of your head.  Keeping your back as relaxed as possible, slowly straighten your elbows while keeping your hips on the floor. You may adjust the placement of your hands to maximize your comfort. As you gain motion, your hands will come more underneath your shoulders.  Hold this position __________ seconds.  Slowly return to lying flat on the floor. Repeat __________ times. Complete this exercise __________ times per day.  RANGE OF MOTION-  Quadruped, Neutral Spine   Assume a hands and knees position on a firm surface. Keep your hands under your shoulders and your knees under your hips. You may place padding under your knees for comfort.  Drop your head and point your tailbone toward the ground below you. This will round out your lower back like an angry cat. Hold this position for __________ seconds.  Slowly lift your head and release your tail bone so that your back sags into a large arch, like an old horse.  Hold this position for __________ seconds.  Repeat this until you feel limber in your low back.  Now, find your "sweet spot." This will be the most comfortable position somewhere between the two previous positions. This is your neutral spine. Once you have found this position, tense your stomach muscles to support your low back.  Hold this position for __________ seconds. Repeat __________ times. Complete this exercise __________ times per day.  STRENGTHENING EXERCISES - Low Back Sprain These exercises may help you when beginning to rehabilitate your injury. These  exercises should be done near your "sweet spot." This is the neutral, low-back arch, somewhere between fully rounded and fully arched, that is your least painful position. When performed in this safe range of motion, these exercises can be used for people who have either a flexion or extension based injury. These exercises may resolve your symptoms with or without further involvement from your physician, physical therapist or athletic trainer. While completing these exercises, remember:   Muscles can gain both the endurance and the strength needed for everyday activities through controlled exercises.  Complete these exercises as instructed by your physician, physical therapist or athletic trainer. Increase the resistance and repetitions only as guided.  You may experience muscle soreness or fatigue, but the pain or discomfort you are trying to eliminate should never worsen during these exercises. If this pain does worsen, stop and make certain you are following the directions exactly. If the pain is still present after adjustments, discontinue the exercise until you can discuss the trouble with your caregiver. STRENGTHENING - Deep Abdominals, Pelvic Tilt   Lie on a firm bed or floor. Keeping your legs in front of you, bend your knees so they are both pointed toward the ceiling and your feet are flat on the floor.  Tense your lower abdominal muscles to press your low back into the floor. This motion will rotate your pelvis so that your tail bone is scooping upwards rather than pointing at your feet or into the floor. With a gentle tension and even breathing, hold this position for __________ seconds. Repeat __________ times. Complete this exercise __________ times per day.  STRENGTHENING - Abdominals, Crunches   Lie on a firm bed or floor. Keeping your legs in front of you, bend your knees so they are both pointed toward the ceiling and your feet are flat on the floor. Cross your arms over your  chest.  Slightly tip your chin down without bending your neck.  Tense your abdominals and slowly lift your trunk high enough to just clear your shoulder blades. Lifting higher can put excessive stress on the lower back and does not further strengthen your abdominal muscles.  Control your return to the starting position. Repeat __________ times. Complete this exercise __________ times per day.  STRENGTHENING - Quadruped, Opposite UE/LE Lift   Assume a hands and knees position on a firm surface. Keep your hands under your shoulders and your knees under your hips.  You may place padding under your knees for comfort.  Find your neutral spine and gently tense your abdominal muscles so that you can maintain this position. Your shoulders and hips should form a rectangle that is parallel with the floor and is not twisted.  Keeping your trunk steady, lift your right hand no higher than your shoulder and then your left leg no higher than your hip. Make sure you are not holding your breath. Hold this position for __________ seconds.  Continuing to keep your abdominal muscles tense and your back steady, slowly return to your starting position. Repeat with the opposite arm and leg. Repeat __________ times. Complete this exercise __________ times per day.  STRENGTHENING - Abdominals and Quadriceps, Straight Leg Raise   Lie on a firm bed or floor with both legs extended in front of you.  Keeping one leg in contact with the floor, bend the other knee so that your foot can rest flat on the floor.  Find your neutral spine, and tense your abdominal muscles to maintain your spinal position throughout the exercise.  Slowly lift your straight leg off the floor about 6 inches for a count of 15, making sure to not hold your breath.  Still keeping your neutral spine, slowly lower your leg all the way to the floor. Repeat this exercise with each leg __________ times. Complete this exercise __________ times per  day. POSTURE AND BODY MECHANICS CONSIDERATIONS - Low Back Sprain Keeping correct posture when sitting, standing or completing your activities will reduce the stress put on different body tissues, allowing injured tissues a chance to heal and limiting painful experiences. The following are general guidelines for improved posture. Your physician or physical therapist will provide you with any instructions specific to your needs. While reading these guidelines, remember:  The exercises prescribed by your provider will help you have the flexibility and strength to maintain correct postures.  The correct posture provides the best environment for your joints to work. All of your joints have less wear and tear when properly supported by a spine with good posture. This means you will experience a healthier, less painful body.  Correct posture must be practiced with all of your activities, especially prolonged sitting and standing. Correct posture is as important when doing repetitive low-stress activities (typing) as it is when doing a single heavy-load activity (lifting). RESTING POSITIONS Consider which positions are most painful for you when choosing a resting position. If you have pain with flexion-based activities (sitting, bending, stooping, squatting), choose a position that allows you to rest in a less flexed posture. You would want to avoid curling into a fetal position on your side. If your pain worsens with extension-based activities (prolonged standing, working overhead), avoid resting in an extended position such as sleeping on your stomach. Most people will find more comfort when they rest with their spine in a more neutral position, neither too rounded nor too arched. Lying on a non-sagging bed on your side with a pillow between your knees, or on your back with a pillow under your knees will often provide some relief. Keep in mind, being in any one position for a prolonged period of time, no matter  how correct your posture, can still lead to stiffness. PROPER SITTING POSTURE In order to minimize stress and discomfort on your spine, you must sit with correct posture. Sitting with good posture should be effortless for a healthy body. Returning to good posture is a gradual process. Many people can work  toward this most comfortably by using various supports until they have the flexibility and strength to maintain this posture on their own. When sitting with proper posture, your ears will fall over your shoulders and your shoulders will fall over your hips. You should use the back of the chair to support your upper back. Your lower back will be in a neutral position, just slightly arched. You may place a small pillow or folded towel at the base of your lower back for  support.  When working at a desk, create an environment that supports good, upright posture. Without extra support, muscles tire, which leads to excessive strain on joints and other tissues. Keep these recommendations in mind: CHAIR:  A chair should be able to slide under your desk when your back makes contact with the back of the chair. This allows you to work closely.  The chair's height should allow your eyes to be level with the upper part of your monitor and your hands to be slightly lower than your elbows. BODY POSITION  Your feet should make contact with the floor. If this is not possible, use a foot rest.  Keep your ears over your shoulders. This will reduce stress on your neck and low back. INCORRECT SITTING POSTURES  If you are feeling tired and unable to assume a healthy sitting posture, do not slouch or slump. This puts excessive strain on your back tissues, causing more damage and pain. Healthier options include:  Using more support, like a lumbar pillow.  Switching tasks to something that requires you to be upright or walking.  Talking a brief walk.  Lying down to rest in a neutral-spine position. PROLONGED  STANDING WHILE SLIGHTLY LEANING FORWARD  When completing a task that requires you to lean forward while standing in one place for a long time, place either foot up on a stationary 2-4 inch high object to help maintain the best posture. When both feet are on the ground, the lower back tends to lose its slight inward curve. If this curve flattens (or becomes too large), then the back and your other joints will experience too much stress, tire more quickly, and can cause pain. CORRECT STANDING POSTURES Proper standing posture should be assumed with all daily activities, even if they only take a few moments, like when brushing your teeth. As in sitting, your ears should fall over your shoulders and your shoulders should fall over your hips. You should keep a slight tension in your abdominal muscles to brace your spine. Your tailbone should point down to the ground, not behind your body, resulting in an over-extended swayback posture.  INCORRECT STANDING POSTURES  Common incorrect standing postures include a forward head, locked knees and/or an excessive swayback. WALKING Walk with an upright posture. Your ears, shoulders and hips should all line-up. PROLONGED ACTIVITY IN A FLEXED POSITION When completing a task that requires you to bend forward at your waist or lean over a low surface, try to find a way to stabilize 3 out of 4 of your limbs. You can place a hand or elbow on your thigh or rest a knee on the surface you are reaching across. This will provide you more stability, so that your muscles do not tire as quickly. By keeping your knees relaxed, or slightly bent, you will also reduce stress across your lower back. CORRECT LIFTING TECHNIQUES DO :  Assume a wide stance. This will provide you more stability and the opportunity to get as close as  possible to the object which you are lifting.  Tense your abdominals to brace your spine. Bend at the knees and hips. Keeping your back locked in a  neutral-spine position, lift using your leg muscles. Lift with your legs, keeping your back straight.  Test the weight of unknown objects before attempting to lift them.  Try to keep your elbows locked down at your sides in order get the best strength from your shoulders when carrying an object.  Always ask for help when lifting heavy or awkward objects. INCORRECT LIFTING TECHNIQUES DO NOT:   Lock your knees when lifting, even if it is a small object.  Bend and twist. Pivot at your feet or move your feet when needing to change directions.  Assume that you can safely pick up even a paperclip without proper posture.   This information is not intended to replace advice given to you by your health care provider. Make sure you discuss any questions you have with your health care provider.   Document Released: 01/06/2005 Document Revised: 01/27/2014 Document Reviewed: 04/20/2008 [employer] Interactive Patient Education [employer]

## 2015-03-23 NOTE — Assessment & Plan Note (Signed)
A: improving BP, slight a bit above goal P: BMP Increase lisinopril to 10 mg daily

## 2015-03-23 NOTE — Progress Notes (Signed)
Subjective:  Patient ID: [name], male    DOB: 1965/02/18  Age: 50 y.o. MRN: [id number]  CC: Follow-up   HPI [name] presents for    1. 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 He is compliant with all medications. No new weakness. No numbness. He was hospitalized from 02/26/15-03/06/15. He has residual L sided weakness. Has started home PT and OT. No new weakness, numbness or slurred speech. He denies cocaine use.   2. Back pain: low back pain started about 6 weeks ago. He was injured a junk yard, heavy pulling. He developed pain in back 45-60 mins later. Pain stays in low back. Associated with stiffness. Pain is sharp. Pain is getting worse. Pain exacerbated by sneezing. Nothing has made it better. Tried robaxin without relief.   3. HTN: taking all meds. HH RN notes SBP of 140-150. DBPs 80-90. No HA, CP or SOB. He has CHF wearing compression stockings to prevent swelling. He needs to establish with a new cardiologist as his former cardiologist has left this practice.    Social History  Substance Use Topics  . Smoking status: Former Smoker -- 0.00 packs/day for 20 years    Types: Cigarettes  . Smokeless tobacco: Not on file  . Alcohol Use: 0.0 oz/week    0 Standard drinks or equivalent per week     Comment: soccially    Outpatient Prescriptions Prior to Visit  Medication Sig Dispense Refill  . aspirin 325 MG EC tablet Take 1 tablet (325 mg total) by mouth daily. 30 tablet 11  . carvedilol (COREG) 3.125 MG tablet Take 1 tablet (3.125 mg total) by mouth 2 (two) times daily with a meal. 60 tablet 3  . FLUoxetine (PROZAC) 20 MG tablet Take 1 tablet (20 mg total) by mouth daily. 30 tablet 3  . fluticasone (FLONASE) 50 MCG/ACT nasal spray Place 1 spray into both nostrils daily.  2  . folic acid (FOLVITE) 1 MG tablet Take 1 tablet (1 mg total) by mouth daily. 30 tablet 1  . furosemide (LASIX) 40 MG tablet Take 2 tablets (80  mg total) by mouth daily. 60 tablet 11  . lisinopril (PRINIVIL,ZESTRIL) 5 MG tablet Take 1 tablet (5 mg total) by mouth daily. 30 tablet 1  . magnesium oxide (MAG-OX) 400 (241.3 Mg) MG tablet Take 1 tablet (400 mg total) by mouth daily. 30 tablet 1  . methocarbamol (ROBAXIN) 500 MG tablet Take 1 tablet (500 mg total) by mouth 3 (three) times daily. 90 tablet 0  . Multiple Vitamin (MULTIVITAMIN WITH MINERALS) TABS tablet Take 1 tablet by mouth daily.    . nicotine (NICODERM CQ - DOSED IN MG/24 HOURS) 14 mg/24hr patch Place 1 patch (14 mg total) onto the skin daily. 28 patch 0  . spironolactone (ALDACTONE) 25 MG tablet Take 0.5 tablets (12.5 mg total) by mouth daily. 60 tablet 11   No facility-administered medications prior to visit.    ROS Review of Systems  Constitutional: Negative for fever, chills, fatigue and unexpected weight change.  Eyes: Negative for visual disturbance.  Respiratory: Negative for cough and shortness of breath.   Cardiovascular: Negative for chest pain, palpitations and leg swelling.  Gastrointestinal: Negative for nausea, vomiting, abdominal pain, diarrhea, constipation and blood in stool.  Endocrine: Negative for polydipsia, polyphagia and polyuria.  Musculoskeletal: Positive for back pain. Negative for myalgias, arthralgias, gait problem and neck pain.  Skin: Negative  for rash.  Allergic/Immunologic: Negative for immunocompromised state.  Neurological: Positive for weakness.  Hematological: Negative for adenopathy. Does not bruise/bleed easily.  Psychiatric/Behavioral: Negative for suicidal ideas, sleep disturbance and dysphoric mood. The patient is not nervous/anxious.     Objective:  BP 140/70 mmHg  Pulse 62  Temp(Src) 98.7 F (37.1 C) (Oral)  Resp 16  Ht 5\' 8"  (1.727 m)  Wt 152 lb (68.947 kg)  BMI 23.12 kg/m2  SpO2 99%  BP/Weight 03/23/2015 03/12/2015 03/06/2015  Systolic BP 140 169 144  Diastolic BP 70 101 88  Wt. (Lbs) 152 150 145.94  BMI 23.12  22.81 22.2    Physical Exam  Constitutional: He appears well-developed and well-nourished. No distress.  HENT:  Head: Normocephalic and atraumatic.  Neck: Normal range of motion. Neck supple.  Cardiovascular: Normal rate, regular rhythm, normal heart sounds and intact distal pulses.   Pulmonary/Chest: Effort normal and breath sounds normal.  Musculoskeletal: He exhibits no edema.       Lumbar back: He exhibits decreased range of motion, pain and spasm. He exhibits no bony tenderness, no swelling, no edema, no deformity and no laceration.  Pain and spasm noted a L4 b/l paraspinal muscles down to the S1 level   Neurological: He is alert.  Weakness in L leg, using walker and AFO  Skin: Skin is warm and dry. No rash noted. No erythema.  Psychiatric: He has a normal mood and affect.   Lab Results  Component Value Date   HGBA1C 5.2 02/25/2015   Assessment & Plan:   [name] was seen today for follow-up.  Diagnoses and all orders for this visit:  Chronic systolic heart failure (HCC)  Accelerated hypertension -     Discontinue: lisinopril (PRINIVIL,ZESTRIL) 10 MG tablet; Take 1 tablet (10 mg total) by mouth daily. -     Discontinue: lisinopril (PRINIVIL,ZESTRIL) 10 MG tablet; Take 1 tablet (10 mg total) by mouth daily. -     lisinopril (PRINIVIL,ZESTRIL) 10 MG tablet; Take 1 tablet (10 mg total) by mouth daily. -     Basic Metabolic Panel  Chronic combined systolic and diastolic congestive heart failure (HCC) -     Ambulatory referral to Cardiology  Low blood magnesium -     Magnesium  Screening for HIV (human immunodeficiency virus) -     HIV antibody (with reflex)  Lumbar strain, subsequent encounter -     Discontinue: acetaminophen-codeine (TYLENOL #3) 300-30 MG tablet; Take 1 tablet by mouth every 8 (eight) hours as needed for moderate pain. -     cyclobenzaprine (FLEXERIL) 10 MG tablet; Take 1 tablet (10 mg total) by mouth 3 (three) times daily as needed for muscle spasms  (for 10 days). -     acetaminophen-codeine (TYLENOL #3) 300-30 MG tablet; Take 1 tablet by mouth every 8 (eight) hours as needed for moderate pain.    Follow-up: No Follow-up on file.   [name] MD

## 2015-03-24 LAB — HIV ANTIBODY (ROUTINE TESTING W REFLEX): HIV 1&2 Ab, 4th Generation: NONREACTIVE

## 2015-03-28 ENCOUNTER — Telehealth: Payer: Self-pay | Admitting: *Deleted

## 2015-03-28 NOTE — Telephone Encounter (Signed)
-----   Message from [name], MD sent at 03/24/2015  9:52 AM EST ----- Screening HIV negative Normal potassium and magnesium  Once you finish the current bottle of magnesium there is no need for refill

## 2015-03-28 NOTE — Telephone Encounter (Signed)
Date of birth verified by pt  Lab results given  No need for Magnesium refill  Pt verbalized understanding

## 2015-04-06 MED FILL — FUROSEMIDE 40 MG TABLET: 40 | 30 days supply | Qty: 60 | Fill #2

## 2015-04-06 MED FILL — FOLIC ACID 1 MG TABLET: 1 | 30 days supply | Qty: 30 | Fill #1

## 2015-04-06 MED FILL — ?CARVEDILOL 3.125 MG TABLET: 3.125 | 30 days supply | Qty: 60 | Fill #1

## 2015-04-06 MED FILL — Magnesium Oxide 400mg table: 30 days supply | Qty: 30 | Fill #1

## 2015-04-06 MED FILL — ?SPIRONOLACTONE 25 MG TABLE: 25 | 30 days supply | Qty: 15 | Fill #1

## 2015-04-10 MED FILL — FLUoxetine HCL 20 MG CAPS: 20 | 30 days supply | Qty: 30 | Fill #0

## 2015-04-17 ENCOUNTER — Encounter: Payer: Self-pay | Admitting: Family Medicine

## 2015-04-17 ENCOUNTER — Ambulatory Visit: Payer: Medicaid Other | Attending: Family Medicine | Admitting: Family Medicine

## 2015-04-17 ENCOUNTER — Encounter: Payer: Self-pay | Admitting: Clinical

## 2015-04-17 VITALS — BP 190/108 | HR 80 | Temp 98.7°F | Resp 16 | Ht 68.0 in | Wt 155.0 lb

## 2015-04-17 DIAGNOSIS — X58XXXS Exposure to other specified factors, sequela: Secondary | ICD-10-CM | POA: Diagnosis not present

## 2015-04-17 DIAGNOSIS — Z7982 Long term (current) use of aspirin: Secondary | ICD-10-CM | POA: Diagnosis not present

## 2015-04-17 DIAGNOSIS — I10 Essential (primary) hypertension: Secondary | ICD-10-CM | POA: Insufficient documentation

## 2015-04-17 DIAGNOSIS — Z79899 Other long term (current) drug therapy: Secondary | ICD-10-CM | POA: Insufficient documentation

## 2015-04-17 DIAGNOSIS — Z87891 Personal history of nicotine dependence: Secondary | ICD-10-CM | POA: Insufficient documentation

## 2015-04-17 DIAGNOSIS — K5901 Slow transit constipation: Secondary | ICD-10-CM | POA: Insufficient documentation

## 2015-04-17 DIAGNOSIS — M549 Dorsalgia, unspecified: Secondary | ICD-10-CM | POA: Insufficient documentation

## 2015-04-17 DIAGNOSIS — N319 Neuromuscular dysfunction of bladder, unspecified: Secondary | ICD-10-CM | POA: Diagnosis not present

## 2015-04-17 DIAGNOSIS — S39012S Strain of muscle, fascia and tendon of lower back, sequela: Secondary | ICD-10-CM

## 2015-04-17 LAB — POCT URINALYSIS DIPSTICK
Bilirubin, UA: NEGATIVE
Blood, UA: NEGATIVE
Glucose, UA: NEGATIVE
Ketones, UA: NEGATIVE
LEUKOCYTES UA: NEGATIVE
NITRITE UA: NEGATIVE
PH UA: 6
PROTEIN UA: NEGATIVE
Spec Grav, UA: 1.02
Urobilinogen, UA: 0.2

## 2015-04-17 LAB — COMPLETE METABOLIC PANEL WITH GFR
ALT: 9 U/L (ref 9–46)
AST: 14 U/L (ref 10–40)
Albumin: 4.1 g/dL (ref 3.6–5.1)
Alkaline Phosphatase: 70 U/L (ref 40–115)
BILIRUBIN TOTAL: 0.7 mg/dL (ref 0.2–1.2)
BUN: 14 mg/dL (ref 7–25)
CHLORIDE: 106 mmol/L (ref 98–110)
CO2: 24 mmol/L (ref 20–31)
Calcium: 9.6 mg/dL (ref 8.6–10.3)
Creat: 0.73 mg/dL (ref 0.60–1.35)
GFR, Est African American: >89 mL/min (ref >=60)
GFR, Est Non African American: >89 mL/min (ref >=60)
GLUCOSE: 80 mg/dL (ref 65–99)
Potassium: 4.2 mmol/L (ref 3.5–5.3)
SODIUM: 139 mmol/L (ref 135–146)
Total Protein: 7 g/dL (ref 6.1–8.1)

## 2015-04-17 MED ORDER — POLYETHYLENE GLYCOL 3350 17 GM/SCOOP PO POWD: 17.0000 g | Freq: Every day | ORAL | Status: DC

## 2015-04-17 MED ORDER — BETHANECHOL CHLORIDE 10 MG PO TABS
10.0000 mg | ORAL_TABLET | Freq: Four times a day (QID) | ORAL | Status: DC
Start: 2015-04-17 — End: 2015-10-09

## 2015-04-17 MED ORDER — CARVEDILOL 6.25 MG PO TABS: 6.2500 mg | ORAL_TABLET | Freq: Two times a day (BID) | ORAL | Status: DC

## 2015-04-17 MED ORDER — LISINOPRIL 40 MG PO TABS: 40.0000 mg | ORAL_TABLET | Freq: Every day | ORAL | Status: DC

## 2015-04-17 MED ORDER — LIDOCAINE 5 % EX OINT: 1.0000 "application " | TOPICAL_OINTMENT | CUTANEOUS | Status: DC | PRN

## 2015-04-17 MED ORDER — ACETAMINOPHEN-CODEINE #3 300-30 MG PO TABS: 1.0000 | ORAL_TABLET | Freq: Three times a day (TID) | ORAL | Status: DC | PRN

## 2015-04-17 NOTE — Progress Notes (Signed)
Subjective:  Patient ID: [name], male    DOB: 13-Mar-1965  Age: 50 y.o. MRN: [id number]  CC: No chief complaint on file.   HPI [name] presents for    1. Back pain: low back pain started about 6 weeks ago. He was injured a junk yard, heavy pulling. He developed pain in back 45-60 mins later. Pain stays in low back. Associated with stiffness. Pain is sharp. Pain is getting worse. Pain exacerbated by sneezing. Nothing has made it better. Tried robaxin without relief.   2. HTN: taking all meds. No HA, CP or SOB. He has CHF wearing compression stockings to prevent swelling. He needs to establish with a new cardiologist as his former cardiologist has left this practice. His has an appointment coming up.   3. Decreased urine output: since stroke.  He has hx of chronic low back pain with spasm that has also worsened since his stroke. He is drinking water and taking diuretics with less and less urine output. One day he only urinated twice all day, small volume.  Also with hesitancy and feeling of obstruction to stream. Also with constipation. No dysuria, hematuria, abdominal pain or flank pain. No fever or chills.    Social History  Substance Use Topics  . Smoking status: Former Smoker -- 0.00 packs/day for 20 years    Types: Cigarettes  . Smokeless tobacco: Not on file  . Alcohol Use: 0.0 oz/week    0 Standard drinks or equivalent per week     Comment: soccially    Outpatient Prescriptions Prior to Visit  Medication Sig Dispense Refill  . acetaminophen-codeine (TYLENOL #3) 300-30 MG tablet Take 1 tablet by mouth every 8 (eight) hours as needed for moderate pain. 60 tablet 0  . aspirin 325 MG EC tablet Take 1 tablet (325 mg total) by mouth daily. 30 tablet 11  . carvedilol (COREG) 3.125 MG tablet Take 1 tablet (3.125 mg total) by mouth 2 (two) times daily with a meal. 60 tablet 3  . FLUoxetine (PROZAC) 20 MG tablet Take 1 tablet (20 mg total) by mouth daily. 30 tablet 3  .  fluticasone (FLONASE) 50 MCG/ACT nasal spray Place 1 spray into both nostrils daily.  2  . folic acid (FOLVITE) 1 MG tablet Take 1 tablet (1 mg total) by mouth daily. 30 tablet 1  . furosemide (LASIX) 40 MG tablet Take 2 tablets (80 mg total) by mouth daily. 60 tablet 11  . lisinopril (PRINIVIL,ZESTRIL) 10 MG tablet Take 1 tablet (10 mg total) by mouth daily. 30 tablet 3  . magnesium oxide (MAG-OX) 400 (241.3 Mg) MG tablet Take 1 tablet (400 mg total) by mouth daily. 30 tablet 1  . Multiple Vitamin (MULTIVITAMIN WITH MINERALS) TABS tablet Take 1 tablet by mouth daily.    . nicotine (NICODERM CQ - DOSED IN MG/24 HOURS) 14 mg/24hr patch Place 1 patch (14 mg total) onto the skin daily. 28 patch 0  . spironolactone (ALDACTONE) 25 MG tablet Take 0.5 tablets (12.5 mg total) by mouth daily. 60 tablet 11   No facility-administered medications prior to visit.    ROS Review of Systems  Constitutional: Negative for fever, chills, fatigue and unexpected weight change.  Eyes: Negative for visual disturbance.  Respiratory: Negative for cough and shortness of breath.   Cardiovascular: Negative for chest pain, palpitations and leg swelling.  Gastrointestinal: Positive for constipation. Negative for nausea, vomiting, abdominal pain, diarrhea and blood in stool.  Endocrine: Negative for polydipsia, polyphagia  and polyuria.  Genitourinary: Positive for decreased urine volume and difficulty urinating. Negative for dysuria, urgency, frequency, hematuria, flank pain, discharge, penile swelling, scrotal swelling, enuresis, genital sores, penile pain and testicular pain.  Musculoskeletal: Positive for back pain. Negative for myalgias, arthralgias, gait problem and neck pain.  Skin: Negative for rash.  Allergic/Immunologic: Negative for immunocompromised state.  Neurological: Positive for weakness.  Hematological: Negative for adenopathy. Does not bruise/bleed easily.  Psychiatric/Behavioral: Negative for suicidal  ideas, sleep disturbance and dysphoric mood. The patient is not nervous/anxious.     Objective:  BP 190/108 mmHg  Pulse 80  Temp(Src) 98.7 F (37.1 C) (Oral)  Resp 16  Ht  (1.727 m)  Wt 155 lb (70.308 kg)  BMI 23.57 kg/m2  SpO2 97%  BP/Weight 04/17/2015 03/23/2015 03/12/2015  Systolic BP 190 140 169  Diastolic BP 108 70 101  Wt. (Lbs) 155 152 150  BMI 23.57 23.12 22.81    Physical Exam  Constitutional: He appears well-developed and well-nourished. No distress.  HENT:  Head: Normocephalic and atraumatic.  Neck: Normal range of motion. Neck supple.  Cardiovascular: Normal rate, regular rhythm, normal heart sounds and intact distal pulses.   Pulmonary/Chest: Effort normal and breath sounds normal.  Abdominal: Soft. Bowel sounds are normal. He exhibits no distension and no mass. There is no tenderness. There is no rebound, no guarding and no CVA tenderness.  Musculoskeletal: He exhibits no edema.       Lumbar back: He exhibits decreased range of motion, pain and spasm. He exhibits no bony tenderness, no swelling, no edema, no deformity and no laceration.  Pain and spasm noted a L4 b/l paraspinal muscles down to the S1 level   Neurological: He is alert.  Weakness in L leg, using walker and AFO  Skin: Skin is warm and dry. No rash noted. No erythema.  Psychiatric: He has a normal mood and affect.   Lab Results  Component Value Date   HGBA1C 5.2 02/25/2015   UA: normal  Assessment & Plan:   Martavis was seen today for hypertension and back pain.  Diagnoses and all orders for this visit:  Neurogenic bladder -     POCT urinalysis dipstick -     bethanechol (URECHOLINE) 10 MG tablet; Take 1 tablet (10 mg total) by mouth 4 (four) times daily.  Accelerated hypertension -     lisinopril (PRINIVIL,ZESTRIL) 40 MG tablet; Take 1 tablet (40 mg total) by mouth daily. -     carvedilol (COREG) 6.25 MG tablet; Take 1 tablet (6.25 mg total) by mouth 2 (two) times daily with a meal. -      COMPLETE METABOLIC PANEL WITH GFR  Lumbar strain, sequela -     lidocaine (XYLOCAINE) 5 % ointment; Apply 1 application topically as needed. Apply to low back -     Ambulatory referral to Pain Clinic -     acetaminophen-codeine (TYLENOL #3) 300-30 MG tablet; Take 1-2 tablets by mouth every 8 (eight) hours as needed for moderate pain.  Slow transit constipation -     polyethylene glycol powder (GLYCOLAX/MIRALAX) powder; Take 17 g by mouth daily.   Follow-up: No Follow-up on file.   [name] MD

## 2015-04-17 NOTE — Progress Notes (Signed)
F/U HTN and back pain  Stated back pain still same as last visit  Stated unable to urinate and constipated  Pain scale #5 Former smoker Taking medication as prescribed  No suicidal thoughts in the past two weeks Elevated BP  Stated took medication at 8:30 Lisinopril and coreg

## 2015-04-17 NOTE — Patient Instructions (Addendum)
[name] was seen today for hypertension and back pain.  Diagnoses and all orders for this visit:  Neurogenic bladder -     POCT urinalysis dipstick -     bethanechol (URECHOLINE) 10 MG tablet; Take 1 tablet (10 mg total) by mouth 4 (four) times daily.  Accelerated hypertension -     lisinopril (PRINIVIL,ZESTRIL) 40 MG tablet; Take 1 tablet (40 mg total) by mouth daily. -     carvedilol (COREG) 6.25 MG tablet; Take 1 tablet (6.25 mg total) by mouth 2 (two) times daily with a meal. -     COMPLETE METABOLIC PANEL WITH GFR  Lumbar strain, sequela -     lidocaine (XYLOCAINE) 5 % ointment; Apply 1 application topically as needed. Apply to low back -     Ambulatory referral to Pain Clinic -     acetaminophen-codeine (TYLENOL #3) 300-30 MG tablet; Take 1-2 tablets by mouth every 8 (eight) hours as needed for moderate pain.  Slow transit constipation -     polyethylene glycol powder (GLYCOLAX/MIRALAX) powder; Take 17 g by mouth daily.   Use bethanechol up to 4 times daily to get your bowel and bladder to move.   F/u in one month for HTN   Dr. Armen Pickup

## 2015-04-17 NOTE — Assessment & Plan Note (Signed)
A; acute MSK back pain has become subacute. Flexeril and robaxin muscle relaxer have not helped much  P: Pain management Lidocaine gel  Refilled tylenol#3

## 2015-04-17 NOTE — Assessment & Plan Note (Signed)
A: obstructive and retention symptoms since stroke concerning for neurogenic bladder. Patient refused rectal exam toady  P: Bethanechol

## 2015-04-17 NOTE — Progress Notes (Signed)
Cardiology Office Note   Date:  04/18/2015   ID:  [name], DOB June 27, 1965, MRN [id number]  PCP:  Lora Paula, MD  Cardiologist:   Madilyn Hook, MD   Chief Complaint  Patient presents with  . New Evaluation    Referred by [name], MD-- Dx. CHF  pt c/o SOB on exertion, swelling in left foot one day last week      History of Present Illness: [name] is a 50 y.o. male with hypertension, prior CVA, chronic systolic and diastolic heart failure, non-compliance and polysubstance abuse who presents to establish care.  [name] had a stroke 02/24/15 in the setting of cocaine abuse and hypertension.  He has residual L sided weakness.  He followed up with his PCP, Dr. [name], who referred him to cardiology to establish care.   on 3/28.  At that appointment lisinopril was increased to 40 mg and carvedilol was increased to 6.25mg .  He just started taking the higher dose this morning.  His prior cardiologist was Dr. [name] and he was last seen 11/2014.  At that time his blood pressure was poorly-controlled.  He required clonidine for management of his hypertension.    [name] reports feeling well.  He denies chest pain but does not shortness of breath when walking and when laying in bed at night.  He sleeps on one pillow.  [name] snores heavily and was referred for a sleep study 6-7 years ago but did not go to the appointment.  His sister fills his medication container daily and cooks a low-salt diet.  However, he admits to adding salt to his food.  He does not follow any particular low salt or low fluid diet.  He does not weigh himself daily.  He was participating in rehab after his stroke and hopes that he will be able to continue this.  He does not get any other formal exercise.  [name] reports that he has not used any drugs or alcohol since his stroke.  He carries a large bag of candy that he uses to avoid drug use.  Past Medical  History  Diagnosis Date  . Hypertension   . Headache   . CHF (congestive heart failure) [facility])     Past Surgical History  Procedure Laterality Date  . No past surgeries       Current Outpatient Prescriptions  Medication Sig Dispense Refill  . acetaminophen-codeine (TYLENOL #3) 300-30 MG tablet Take 1-2 tablets by mouth every 8 (eight) hours as needed for moderate pain. 90 tablet 0  . aspirin 325 MG EC tablet Take 1 tablet (325 mg total) by mouth daily. 30 tablet 11  . bethanechol (URECHOLINE) 10 MG tablet Take 1 tablet (10 mg total) by mouth 4 (four) times daily. 120 tablet 3  . carvedilol (COREG) 6.25 MG tablet Take 1 tablet (6.25 mg total) by mouth 2 (two) times daily with a meal. 60 tablet 1  . FLUoxetine (PROZAC) 20 MG tablet Take 1 tablet (20 mg total) by mouth daily. 30 tablet 3  . fluticasone (FLONASE) 50 MCG/ACT nasal spray Place 1 spray into both nostrils daily.  2  . folic acid (FOLVITE) 1 MG tablet Take 1 tablet (1 mg total) by mouth daily. 30 tablet 1  . furosemide (LASIX) 40 MG tablet Take 2 tablets (80 mg total) by mouth daily. 60 tablet 11  . lidocaine (XYLOCAINE) 5 % ointment Apply 1 application topically as needed.  Apply to low back 35.44 g 0  . lisinopril (PRINIVIL,ZESTRIL) 40 MG tablet Take 1 tablet (40 mg total) by mouth daily. 30 tablet 3  . magnesium oxide (MAG-OX) 400 (241.3 Mg) MG tablet Take 1 tablet (400 mg total) by mouth daily. 30 tablet 1  . Multiple Vitamin (MULTIVITAMIN WITH MINERALS) TABS tablet Take 1 tablet by mouth daily.    . nicotine (NICODERM CQ - DOSED IN MG/24 HOURS) 14 mg/24hr patch Place 1 patch (14 mg total) onto the skin daily. 28 patch 0  . polyethylene glycol powder (GLYCOLAX/MIRALAX) powder Take 17 g by mouth daily. 3350 g 1  . spironolactone (ALDACTONE) 25 MG tablet Take 0.5 tablets (12.5 mg total) by mouth daily. 60 tablet 11   No current facility-administered medications for this visit.    Allergies:   Penicillins    Social  History:  The patient  reports that he has quit smoking. His smoking use included Cigarettes. He smoked 0.00 packs per day for 20 years. He does not have any smokeless tobacco history on file. He reports that he drinks alcohol. He reports that he uses illicit drugs (Cocaine).   Family History:  The patient's family history includes Hyperlipidemia in his mother; Hypertension in his brother, father, mother, and sister.    ROS:  Please see the history of present illness.   Otherwise, review of systems are positive for none.   All other systems are reviewed and negative.    PHYSICAL EXAM: VS:  BP 164/102 mmHg  Pulse 85  Ht  (1.727 m)  Wt 69.582 kg (153 lb 6.4 oz)  BMI 23.33 kg/m2 , BMI Body mass index is 23.33 kg/(m^2). GENERAL:  Chronically ill-appearing.   HEENT:  Pupils equal round and reactive, fundi not visualized, oral mucosa unremarkable NECK:  No jugular venous distention, waveform within normal limits, carotid upstroke brisk and symmetric, no bruits, no thyromegaly LYMPHATICS:  No cervical adenopathy LUNGS:  Clear to auscultation bilaterally HEART:  RRR.  PMI not displaced or sustained,S1 and S2 within normal limits, no S3, no S4, no clicks, no rubs, no murmurs ABD:  Flat, positive bowel sounds normal in frequency in pitch, no bruits, no rebound, no guarding, no midline pulsatile mass, no hepatomegaly, no splenomegaly EXT:  2 plus pulses throughout, no edema, no cyanosis no clubbing SKIN:  No rashes no nodules NEURO:  Cranial nerves II through XII grossly intact, motor grossly intact throughout.  Uncontrollable shaking of the R leg. PSYCH:  Cognitively intact, oriented to person place and time  EKG:  EKG is ordered today. The ekg ordered today demonstrates sinus rhythm rate 85 bpm.  LVH with repolarization abnormality.  Unchanged from prior.   Echo 02/25/15: Study Conclusions  - Left ventricle: The cavity size was mildly dilated. There was  moderate concentric hypertrophy.  Systolic function was normal.  The estimated ejection fraction was in the range of 15% to 20%.  Severe diffuse hypokinesis. Although no diagnostic regional wall  motion abnormality was identified, this possibility cannot be  completely excluded on the basis of this study. Doppler  parameters are consistent with abnormal left ventricular  relaxation (grade 1 diastolic dysfunction). - Mitral valve: There was mild regurgitation. - Left atrium: The atrium was moderately dilated.   Recent Labs: 02/27/2015: Hemoglobin 13.6; Platelets 153 03/23/2015: Magnesium 2.0 04/17/2015: ALT 9; BUN 14; Creat 0.73; Potassium 4.2; Sodium 139    Lipid Panel    Component Value Date/Time   CHOL 131 02/25/2015 0328   TRIG  134 02/25/2015 0328   HDL 83 02/25/2015 0328   CHOLHDL 1.6 02/25/2015 0328   VLDL 27 02/25/2015 0328   LDLCALC 21 02/25/2015 0328      Wt Readings from Last 3 Encounters:  04/18/15 69.582 kg (153 lb 6.4 oz)  04/17/15 70.308 kg (155 lb)  03/23/15 68.947 kg (152 lb)      ASSESSMENT AND PLAN:  # Chronic systolic and diastolic heart failure:  [name] appears to be euvolemic at this time.  He just started the higher doses of carvedilol and lisinopril this morning. I have asked him and his mother to check his BP daily and bring the results to a follow up appointment with Phillips Hay in 2 weeks.   We discussed the importance of limiting his salt and fluid intake.  His family has been very helpful in limiting his salt intake but he adds salt to the family that they give him.  We also discussed the importance of weighing himself daily.  Continue carvedilol, lisinopril, and spironolactone. Given his history of cocaine abuse as recently as a few months ago he is not a candidate for an ICD at this time. If he can abstain for from illicit substances for a more substantial period of time we will need to consider ICD.   # Hypertensive heart disease: Blood pressure is poorly-controlled as  above. We have asked him to keep a blood pressure log and to follow-up with our pharmacist in 2 weeks.  We will not make any changes at this time as he just started a new regimen this morning.  He reports that he has not been using cocaine lately. For now, we will continue carvedilol. If he continues to use cocaine in the future we will need to switch this to an alternative drug class given the potential interaction with cocaine and beta blockers.  # Snoring: [name] endorses daytime somnolence and heavy snoring. We will refer him for sleep study.  Epworth sleepiness scale was 9.   # Polysubstance abuse: We discussed the importance of abstinence from illicit substances. He has been using candy to deter him from drinking alcohol or using drugs. We suggested that he try healthier snack options such as fruit and vegetables.   Current medicines are reviewed at length with the patient today.  The patient does not have concerns regarding medicines.  The following changes have been made:  no change  Labs/ tests ordered today include:   Orders Placed This Encounter  Procedures  . EKG 12-Lead  . Split night study     Disposition:   FU with Walfred Bettendorf C. [name], MD, [facility] in 6 months.      This note was written with the assistance of speech recognition software.  Please excuse any transcriptional errors.  Signed, Brianne Maina C. [name], MD, [facility]  04/18/2015 7:13 PM    [facility]

## 2015-04-17 NOTE — Progress Notes (Signed)
Depression screen [facility] 2/9 04/17/2015 03/12/2015 03/12/2015 12/06/2014 09/27/2014  Decreased Interest 2 3 3 2  0  Down, Depressed, Hopeless 1 3 3 1  0  PHQ - 2 Score 3 6 6 3  0  Altered sleeping 2 0 - 0 -  Tired, decreased energy 3 3 - 0 -  Change in appetite 1 3 - 0 -  Feeling bad or failure about yourself  2 3 - 0 -  Trouble concentrating 0 3 - 0 -  Moving slowly or fidgety/restless 1 3 - 1 -  Suicidal thoughts 0 1 - 0 -  PHQ-9 Score 12 22 - 4 -    GAD 7 : Generalized Anxiety Score 04/17/2015 03/12/2015  Nervous, Anxious, on Edge 2 3  Control/stop worrying 2 3  Worry too much - different things 3 3  Trouble relaxing 1 3  Restless 0 3  Easily annoyed or irritable 3 3  Afraid - awful might happen 1 1  Total GAD 7 Score 12 19

## 2015-04-17 NOTE — Assessment & Plan Note (Signed)
A: HTN with elevated BP P: Increase lisinopril from 10 to 40 mg daily Increase coreg from 3.125 to 6.25 mg BID CMP today

## 2015-04-18 ENCOUNTER — Ambulatory Visit (INDEPENDENT_AMBULATORY_CARE_PROVIDER_SITE_OTHER): Payer: Medicaid Other | Admitting: Cardiovascular Disease

## 2015-04-18 ENCOUNTER — Encounter: Payer: Self-pay | Admitting: Cardiovascular Disease

## 2015-04-18 VITALS — BP 164/102 | HR 85 | Ht 68.0 in | Wt 153.4 lb

## 2015-04-18 DIAGNOSIS — R0683 Snoring: Secondary | ICD-10-CM

## 2015-04-18 DIAGNOSIS — I5042 Chronic combined systolic (congestive) and diastolic (congestive) heart failure: Secondary | ICD-10-CM

## 2015-04-18 DIAGNOSIS — I110 Hypertensive heart disease with heart failure: Secondary | ICD-10-CM | POA: Diagnosis not present

## 2015-04-18 DIAGNOSIS — F1910 Other psychoactive substance abuse, uncomplicated: Secondary | ICD-10-CM

## 2015-04-18 MED FILL — LIDOCAINE 5% OINTMENT: 5 | 15 days supply | Qty: 35 | Fill #0

## 2015-04-18 MED FILL — CARVEDILOL 6.25 MG TABLET: 6.25 | 30 days supply | Qty: 60 | Fill #0

## 2015-04-18 MED FILL — LISINOPRIL 40 MG TABLET: 40 | 30 days supply | Qty: 30 | Fill #0

## 2015-04-18 MED FILL — ACETAMINOPHEN/COD #3 TABLET: 300-30 | 30 days supply | Qty: 90 | Fill #0

## 2015-04-18 MED FILL — POLYETHYLENE GLYCOL 3350: 30 days supply | Qty: 510 | Fill #0

## 2015-04-18 MED FILL — BETHANECHOL 10 MG TABLET: 10 | 30 days supply | Qty: 120 | Fill #0

## 2015-04-18 NOTE — Patient Instructions (Addendum)
Medication Instructions:  Your physician recommends that you continue on your current medications as directed. Please refer to the Current Medication list given to you today.  Labwork: none  Testing/Procedures: Your physician has recommended that you have a sleep study. This test records several body functions during sleep, including: brain activity, eye movement, oxygen and carbon dioxide blood levels, heart rate and rhythm, breathing rate and rhythm, the flow of air through your mouth and nose, snoring, body muscle movements, and chest and belly movement.  Follow-Up: Your physician wants you to follow-up in: 6 month ov You will receive a reminder letter in the mail two months in advance. If you don't receive a letter, please call our office to schedule the follow-up appointment.  Your physician recommends that you schedule a follow-up appointment in: 2 weeks for blood pressure check with Roney Marion D  If you need a refill on your cardiac medications before your next appointment, please call your pharmacy.

## 2015-04-19 ENCOUNTER — Encounter: Payer: Self-pay | Admitting: Physical Medicine & Rehabilitation

## 2015-04-19 ENCOUNTER — Encounter: Payer: Medicaid Other | Attending: Physical Medicine & Rehabilitation | Admitting: Physical Medicine & Rehabilitation

## 2015-04-19 VITALS — BP 156/98 | HR 86

## 2015-04-19 DIAGNOSIS — I69322 Dysarthria following cerebral infarction: Secondary | ICD-10-CM | POA: Diagnosis not present

## 2015-04-19 DIAGNOSIS — I110 Hypertensive heart disease with heart failure: Secondary | ICD-10-CM | POA: Insufficient documentation

## 2015-04-19 DIAGNOSIS — I69354 Hemiplegia and hemiparesis following cerebral infarction affecting left non-dominant side: Secondary | ICD-10-CM | POA: Insufficient documentation

## 2015-04-19 DIAGNOSIS — I10 Essential (primary) hypertension: Secondary | ICD-10-CM

## 2015-04-19 DIAGNOSIS — Z09 Encounter for follow-up examination after completed treatment for conditions other than malignant neoplasm: Secondary | ICD-10-CM | POA: Diagnosis present

## 2015-04-19 DIAGNOSIS — I5030 Unspecified diastolic (congestive) heart failure: Secondary | ICD-10-CM | POA: Insufficient documentation

## 2015-04-19 DIAGNOSIS — R269 Unspecified abnormalities of gait and mobility: Secondary | ICD-10-CM | POA: Diagnosis not present

## 2015-04-19 DIAGNOSIS — R51 Headache: Secondary | ICD-10-CM | POA: Insufficient documentation

## 2015-04-19 DIAGNOSIS — Z87891 Personal history of nicotine dependence: Secondary | ICD-10-CM | POA: Insufficient documentation

## 2015-04-19 DIAGNOSIS — I429 Cardiomyopathy, unspecified: Secondary | ICD-10-CM | POA: Insufficient documentation

## 2015-04-19 DIAGNOSIS — R339 Retention of urine, unspecified: Secondary | ICD-10-CM | POA: Diagnosis not present

## 2015-04-19 NOTE — Progress Notes (Addendum)
Subjective:    Patient ID: [name], male    DOB: 08-05-1965, 50 y.o.   MRN: [id number]  HPI 50 year old right-handed male with history of hypertension, chronic headaches, nonischemic cardiomyopathy, diastolic congestive heart failure, tobacco and polysubstance abuse who presents for follow up after being discharged from the hospital on 03/06/15 after right MCA infarct.  He was encouraged to follow up with his PCP regarding BP meds.  Since discharge he has been having difficulty with urination and bowel movement.  He had therapies, but they were d/ced due to insurance. He continues to have difficulty with coordination.  He also complains of back pain, which does not seem to improve with medications.   He uses a rolling walker and AFO for ambulation.   Pain Inventory Average Pain 5 Pain Right Now 6 My pain is stabbing, tingling and aching  In the last 24 hours, has pain interfered with the following? General activity 2 Relation with others 2 Enjoyment of life 3 What TIME of day is your pain at its worst? na Sleep (in general) NA  Pain is worse with: walking, sitting and standing Pain improves with: heat/ice, therapy/exercise and medication Relief from Meds: 1  Mobility walk with assistance use a walker how many minutes can you walk? 2-3 needs help with transfers transfers alone  Function I need assistance with the following:  bathing, meal prep, household duties and shopping  Neuro/Psych bladder control problems bowel control problems weakness numbness tingling trouble walking spasms confusion depression anxiety  Prior Studies Any changes since last visit?  no  Physicians involved in your care Primary care Jocelyn Funches   Family History  Problem Relation Age of Onset  . Hypertension Mother   . Hyperlipidemia Mother   . Hypertension Father   . Hypertension Sister   . Hypertension Brother    Social History   Social History  . Marital Status:  Single    Spouse Name: N/A  . Number of Children: N/A  . Years of Education: N/A   Social History Main Topics  . Smoking status: Former Smoker -- 0.00 packs/day for 20 years    Types: Cigarettes  . Smokeless tobacco: None  . Alcohol Use: 0.0 oz/week    0 Standard drinks or equivalent per week     Comment: soccially  . Drug Use: Yes    Special: Cocaine     Comment: stopped using cocaine 11/19/14  . Sexual Activity: Not Asked   Other Topics Concern  . None   Social History Narrative   Past Surgical History  Procedure Laterality Date  . No past surgeries     Past Medical History  Diagnosis Date  . Hypertension   . Headache   . CHF (congestive heart failure) (HCC)    BP 156/98 mmHg  Pulse 86  SpO2 99%  Opioid Risk Score:   Fall Risk Score:  `1  Depression screen PHQ 2/9  Depression screen [facility] 2/9 04/19/2015 04/17/2015 03/12/2015 03/12/2015 12/06/2014 09/27/2014 05/24/2014  Decreased Interest 0 3  Down, Depressed, Hopeless 0 2  PHQ - 2 Score 0 5  Altered sleeping 2 2 0 - 0 - -  Tired, decreased energy - 0 - -  Change in appetite - 0 - -  Feeling bad or failure about yourself  - 0 - -  Trouble concentrating 3 0 3 -  0 - -  Moving slowly or fidgety/restless 0 1 3 - 1 - -  Suicidal thoughts 0 0 1 - 0 - -  PHQ-9 Score 18 12 22  - 4 - -  Difficult doing work/chores Very difficult - - - - - -    Review of Systems  Constitutional: Positive for appetite change.  Respiratory: Positive for apnea and shortness of breath.   Genitourinary: Positive for difficulty urinating.  All other systems reviewed and are negative.     Objective:   Physical Exam Constitutional: He appears well-developed and well-nourished. NAD HENT: Normocephalic and atraumatic.  Eyes: EOM and Conj are normal.   Cardiovascular: Normal rate and regular rhythm.  Respiratory: Effort normal and breath sounds normal. No respiratory distress.  GI: Soft.  Bowel sounds are normal. He exhibits no distension.  Musculoskeletal: He exhibits no edema or tenderness.  Gait: Left foot drag. Neurological: He is alert and oriented.  Speech mildly dysarthric.  Awareness of deficits Motor: RUE/RLE: 5/5 proximal distal LUE: 4+/5 proximal distal LLE: Hip flexion, knee extension 3-/5, ankle dorsi/plantarflexion 3-/5, with ?emerging tone Skin: Skin is warm and dry.  Psychiatric: He has a normal mood and affect. His behavior is normal     Assessment & Plan:  50 year old right-handed male with history of hypertension, chronic headaches, nonischemic cardiomyopathy, diastolic congestive heart failure, tobacco and polysubstance abuse who presents for hospital follow up of right MCA infarct.    1. Left-sided weakness (>>LLE) and dysarthria secondary to right MCA infarct on 02/24/15.  Cont PT/OT, pt to follow up on treatment  Cont meds  Follow up Neurology  LLE strength appears to be less than when in hospital, however, pt states this varies.  Could be confounded by tone as well.    Will consider further workup on next visit after other issues addressed.    2. Hypertension  Pt following up with PCP and Cardiology  Cont meds  3. Abnormality of gait  Denies falls  Cont rolling walker for safety  Cont AFO LLE  4. Urinary retention  Being evaluated by PCP for prostate issues  If workup negative would consider neurological etiology

## 2015-04-20 ENCOUNTER — Telehealth: Payer: Self-pay | Admitting: *Deleted

## 2015-04-20 NOTE — Telephone Encounter (Signed)
-----   Message from Enobong Amao, MD sent at 04/20/2015  8:12 AM EDT ----- Please inform the patient that labs are normal. Thank you. 

## 2015-04-20 NOTE — Telephone Encounter (Signed)
Date of birth verified by pt  Lab results given Pt verbalized understanding 

## 2015-05-02 ENCOUNTER — Telehealth: Payer: Self-pay | Admitting: Cardiovascular Disease

## 2015-05-02 NOTE — Telephone Encounter (Signed)
Called pt, unable to reach at listed number. Returned call to Judith Part, mother of patient, listed DPR.  She recounts that the patient stayed with her for a few days, but usually stays at a house with several other non-family members.  She does not think patient is compliant w/ his medications. She notes she takes his BP when he comes by her house and reports these values as noted below, over a period of 1-2 weeks. Appears he cancelled a BP visit for tomorrow w/ Belenda Cruise.  I voiced understanding and concern, asked if any way to help - she does not think so. She just wanted [name] to be aware of pt's non-compliance. She does note he has a neuro and PCP f/u in 2 weeks. Advised to encourage him to keep these appts, and to call us if we can be of further assistance. Encouraged compliance w/ medications & for patient to contact us if any questions on how to take his meds, etc.  Caller voiced thanks and acknowledgment.

## 2015-05-02 NOTE — Telephone Encounter (Signed)
New message   Mother wants to report his bp readings   170/108 173/104 158/102 196/124 172/103  Please have rn call

## 2015-05-03 ENCOUNTER — Telehealth: Payer: Self-pay | Admitting: Clinical

## 2015-05-03 ENCOUNTER — Ambulatory Visit: Payer: Medicaid Other | Admitting: Pharmacist Clinician (PhC)/ Clinical Pharmacy Specialist

## 2015-05-03 NOTE — Telephone Encounter (Signed)
Termination w pt; pt aware he may make appointment with [facility] prior to end of April, as needed.

## 2015-05-06 NOTE — Telephone Encounter (Signed)
Please reschedule follow up with [facility] asap and with me in 1 month

## 2015-05-07 NOTE — Telephone Encounter (Signed)
Spoke with patient and he asked that I call his mother   Spoke with mother and she does not want to bring him in for visit with Belenda Cruise secondary to patients noncompliance with medications Per mother he says he is taking but his sister who helps with his medication boxes says he is not Mother understands the reasons Dr [name] wants patient to have follow up but feels it is a waist of her time and money since he is not doing what he should be and she can not make him take his medications She understands brining him to see Roney Marion D would allow medication changes to be made however adding a new medication will not help if he is not taking the ones he has now.  She is willing to bring patient to appointment with Dr [name] if she would like to see him Will forward to Dr [name] for review

## 2015-05-08 NOTE — Telephone Encounter (Signed)
I understand her point.  Nothing else we can do.  Hopefully he will keep follow up that is scheduled for 6 months.

## 2015-05-08 NOTE — Telephone Encounter (Signed)
Advised mother and to call if something should come up before then and he needs to be seen

## 2015-05-14 ENCOUNTER — Ambulatory Visit (INDEPENDENT_AMBULATORY_CARE_PROVIDER_SITE_OTHER): Payer: Medicaid Other | Admitting: Neurology

## 2015-05-14 ENCOUNTER — Encounter: Payer: Self-pay | Admitting: Neurology

## 2015-05-14 VITALS — BP 160/103 | HR 64 | Ht 68.0 in | Wt 146.8 lb

## 2015-05-14 DIAGNOSIS — R269 Unspecified abnormalities of gait and mobility: Secondary | ICD-10-CM

## 2015-05-14 DIAGNOSIS — G8110 Spastic hemiplegia affecting unspecified side: Secondary | ICD-10-CM

## 2015-05-14 DIAGNOSIS — R251 Tremor, unspecified: Secondary | ICD-10-CM

## 2015-05-14 MED ORDER — BACLOFEN 10 MG PO TABS: 5.0000 mg | ORAL_TABLET | Freq: Three times a day (TID) | ORAL | Status: DC

## 2015-05-14 MED FILL — BACLOFEN 10 MG TABLET: 10 | 30 days supply | Qty: 45 | Fill #0

## 2015-05-14 NOTE — Patient Instructions (Signed)
I had a long d/w patient and his mother about his recent stroke, risk for recurrent stroke/TIAs, personally independently reviewed imaging studies and stroke evaluation results and answered questions.Continue aspirin 325 mg daily  for secondary stroke prevention and maintain strict control of hypertension with blood pressure goal below 130/90, diabetes with hemoglobin A1c goal below 6.5% and lipids with LDL cholesterol goal below 70 mg/dL. I also advised the patient to eat a healthy diet with plenty of whole grains, cereals, fruits and vegetables, exercise regularly and maintain ideal body weight .He was counseled to quit cocaine, smoking and alcohol and to use his walker at all times and about fall and safety precautions. Trial of baclofen 5 mg 3 times daily for his spasticity and muscle tightness and increase if tolerated after 2 weeks to 10 mg 3 times daily. I discussed possible side effects with the patient and mother and advised him to call me if needed. We will also asked for home health physical and occupational therapy for gait and balance training. Followup in the future with stroke nurse practitioner in 3 months or call earlier if necessary Fall Prevention in the Home  Falls can cause injuries and can affect people from all age groups. There are many simple things that you can do to make your home safe and to help prevent falls. WHAT CAN I DO ON THE OUTSIDE OF MY HOME?  Regularly repair the edges of walkways and driveways and fix any cracks.  Remove high doorway thresholds.  Trim any shrubbery on the main path into your home.  Use bright outdoor lighting.  Clear walkways of debris and clutter, including tools and rocks.  Regularly check that handrails are securely fastened and in good repair. Both sides of any steps should have handrails.  Install guardrails along the edges of any raised decks or porches.  Have leaves, snow, and ice cleared regularly.  Use sand or salt on walkways  during winter months.  In the garage, clean up any spills right away, including grease or oil spills. WHAT CAN I DO IN THE BATHROOM?  Use night lights.  Install grab bars by the toilet and in the tub and shower. Do not use towel bars as grab bars.  Use non-skid mats or decals on the floor of the tub or shower.  If you need to sit down while you are in the shower, use a plastic, non-slip stool.Marland Kitchen  Keep the floor dry. Immediately clean up any water that spills on the floor.  Remove soap buildup in the tub or shower on a regular basis.  Attach bath mats securely with double-sided non-slip rug tape.  Remove throw rugs and other tripping hazards from the floor. WHAT CAN I DO IN THE BEDROOM?  Use night lights.  Make sure that a bedside light is easy to reach.  Do not use oversized bedding that drapes onto the floor.  Have a firm chair that has side arms to use for getting dressed.  Remove throw rugs and other tripping hazards from the floor. WHAT CAN I DO IN THE KITCHEN?   Clean up any spills right away.  Avoid walking on wet floors.  Place frequently used items in easy-to-reach places.  If you need to reach for something above you, use a sturdy step stool that has a grab bar.  Keep electrical cables out of the way.  Do not use floor polish or wax that makes floors slippery. If you have to use wax, make sure that it  is non-skid floor wax.  Remove throw rugs and other tripping hazards from the floor. WHAT CAN I DO IN THE STAIRWAYS?  Do not leave any items on the stairs.  Make sure that there are handrails on both sides of the stairs. Fix handrails that are broken or loose. Make sure that handrails are as long as the stairways.  Check any carpeting to make sure that it is firmly attached to the stairs. Fix any carpet that is loose or worn.  Avoid having throw rugs at the top or bottom of stairways, or secure the rugs with carpet tape to prevent them from moving.  Make  sure that you have a light switch at the top of the stairs and the bottom of the stairs. If you do not have them, have them installed. WHAT ARE SOME OTHER FALL PREVENTION TIPS?  Wear closed-toe shoes that fit well and support your feet. Wear shoes that have rubber soles or low heels.  When you use a stepladder, make sure that it is completely opened and that the sides are firmly locked. Have someone hold the ladder while you are using it. Do not climb a closed stepladder.  Add color or contrast paint or tape to grab bars and handrails in your home. Place contrasting color strips on the first and last steps.  Use mobility aids as needed, such as canes, walkers, scooters, and crutches.  Turn on lights if it is dark. Replace any light bulbs that burn out.  Set up furniture so that there are clear paths. Keep the furniture in the same spot.  Fix any uneven floor surfaces.  Choose a carpet design that does not hide the edge of steps of a stairway.  Be aware of any and all pets.  Review your medicines with your healthcare provider. Some medicines can cause dizziness or changes in blood pressure, which increase your risk of falling. Talk with your health care provider about other ways that you can decrease your risk of falls. This may include working with a physical therapist or trainer to improve your strength, balance, and endurance.   This information is not intended to replace advice given to you by your health care provider. Make sure you discuss any questions you have with your health care provider.   Document Released: 12/27/2001 Document Revised: 05/23/2014 Document Reviewed: 02/10/2014 [employer] Interactive Patient Education [employer]

## 2015-05-14 NOTE — Progress Notes (Signed)
[facility] [address] [place]. [place] [address] [phone]       OFFICE FOLLOW-UP NOTE  [name] Date of Birth:  1965/12/26 Medical Record Number:  [id number]   HPI: [name] is a 24 year Caucasian male seen today for the first office follow-up visit following hospital admission for stroke in February 2017. He is accompanied by his fiance.[name] is an 50 y.o. male history of hypertension and heart failure brought to the emergency room and code stroke status for acute onset of slurred speech, altered mental status and reduced movements of left extremities. Patient was last known well at 2:00 AM today. His fiance indicated that he had sudden change in mental status and speech was garbled and he was not moving his left side. EMS noticed lack of movement of left side as well as facial droop and slurred speech. He became agitated in route to the emergency room and was given milligrams of Versed. He received an additional 2 mg of Ativan, as well as 5 mg of Haldol. Patient remained markedly agitated. Initially appeared to have left-sided weakness which subsequently resolved, as did his gaze to the right side. CT scan of his head showed no acute intracranial abnormality. Patient was intubated and placed on mechanical ventilation, as well as on propofol for sedation. He has no previous history of stroke nor TIA. There was also no history of seizure activity. He reportedly had not been drinking alcohol. Blood alcohol and urine drug screen are pending. Treatment intervention with TPA was deferred when patient's focal deficits resolved. CT scan of the head on admission showed no acute abnormality but MRI showed a small acute cortically based infarct in the right lateral peri-rolandic cortex. Carotid Doppler showed no significant extracranial stenosis. Transthoracic echo showed diminished ejection fraction of 15-20% with severe diffuse hypokinesis but no clot. LDL  cholesterol was low at 21 mg percent and hemoglobin A1c was 5.2. Patient was not considered a good long-term candidate for anticoagulation given his substance abuse history and fall risk hence TEE and loop recorder, not considered. Patient was started on aspirin and was transferred to inpatient rehabilitation. He has made gradual improvement and is currently at home. He is able to walk with a walker but has noticed increased difficulty in walking particularly in the last week or so. His dragging not only his left leg affected by the stroke but also the right leg to. He also reports intermittent tremor of his right leg but she cannot suppress. He is living by himself but does have help with his family with sister providing food and medications. Family members drive him for his appointments. He had a fall last week while he was trying to take out the trash can. Patient was getting home physical and occupational therapy but this was discontinued as he did not have insurance. He however has recently got Medicaid approved and wants me to reorder it. He states his blood pressure is still poorly controlled and remains high at home and today it is 162/102 in office. Is tolerating aspirin well without bleeding or other side effects. He states he has quit drugs and alcohol but it is hard to know whether this is true as he lives alone   ROS:   14 system review of systems is positive for  excessive sweating, shortness of breath, vomiting, restless leg, insomnia, frequency of urination, back pain, muscle cramps, walking difficulty, memory loss, headache, tremors, agitation, confusion, depression, anxiety, nervousness and all  other systems negative  PMH:  Past Medical History  Diagnosis Date  . Hypertension   . Headache   . CHF (congestive heart failure) (HCC)   . Stroke [facility])     Social History:  Social History   Social History  . Marital Status: Single    Spouse Name: N/A  . Number of Children: N/A  .  Years of Education: N/A   Occupational History  . Not on file.   Social History Main Topics  . Smoking status: Current Every Day Smoker -- 0.25 packs/day for 20 years    Types: Cigarettes  . Smokeless tobacco: Not on file     Comment: ONLY SMOKES WHEN HE IS STRESS OUT  . Alcohol Use: 0.6 oz/week    1 Cans of beer, 0 Standard drinks or equivalent per week     Comment: soccially   . Drug Use: Yes    Special: Cocaine     Comment: stopped using cocaine 11/19/14  . Sexual Activity: Not on file   Other Topics Concern  . Not on file   Social History Narrative    Medications:   Current Outpatient Prescriptions on File Prior to Visit  Medication Sig Dispense Refill  . acetaminophen-codeine (TYLENOL #3) 300-30 MG tablet Take 1-2 tablets by mouth every 8 (eight) hours as needed for moderate pain. 90 tablet 0  . aspirin 325 MG EC tablet Take 1 tablet (325 mg total) by mouth daily. 30 tablet 11  . bethanechol (URECHOLINE) 10 MG tablet Take 1 tablet (10 mg total) by mouth 4 (four) times daily. 120 tablet 3  . carvedilol (COREG) 6.25 MG tablet Take 1 tablet (6.25 mg total) by mouth 2 (two) times daily with a meal. 60 tablet 1  . FLUoxetine (PROZAC) 20 MG tablet Take 1 tablet (20 mg total) by mouth daily. 30 tablet 3  . fluticasone (FLONASE) 50 MCG/ACT nasal spray Place 1 spray into both nostrils daily.  2  . folic acid (FOLVITE) 1 MG tablet Take 1 tablet (1 mg total) by mouth daily. 30 tablet 1  . furosemide (LASIX) 40 MG tablet Take 2 tablets (80 mg total) by mouth daily. 60 tablet 11  . lidocaine (XYLOCAINE) 5 % ointment Apply 1 application topically as needed. Apply to low back 35.44 g 0  . lisinopril (PRINIVIL,ZESTRIL) 40 MG tablet Take 1 tablet (40 mg total) by mouth daily. 30 tablet 3  . magnesium oxide (MAG-OX) 400 (241.3 Mg) MG tablet Take 1 tablet (400 mg total) by mouth daily. 30 tablet 1  . Multiple Vitamin (MULTIVITAMIN WITH MINERALS) TABS tablet Take 1 tablet by mouth daily.    .  polyethylene glycol powder (GLYCOLAX/MIRALAX) powder Take 17 g by mouth daily. 3350 g 1  . spironolactone (ALDACTONE) 25 MG tablet Take 0.5 tablets (12.5 mg total) by mouth daily. 60 tablet 11   No current facility-administered medications on file prior to visit.    Allergies:   Allergies  Allergen Reactions  . Penicillins Other (See Comments)    Childhood     Physical Exam General: well developed, well nourished, seated, in no evident distress Head: head normocephalic and atraumatic.  Neck: supple with no carotid or supraclavicular bruits Cardiovascular: regular rate and rhythm, no murmurs Musculoskeletal: no deformity Skin:  no rash/petichiae Vascular:  Normal pulses all extremities Filed Vitals:   05/14/15 1519  BP: 160/103  Pulse: 64   Neurologic Exam Mental Status: Awake and fully alert. Oriented to place and time. Recent and remote  memory intact. Attention span, concentration and fund of knowledge appropriate. Mood and affect appropriate.  Cranial Nerves: Fundoscopic exam reveals sharp disc margins. Pupils equal, briskly reactive to light. Extraocular movements full without nystagmus. Visual fields full to confrontation. Hearing intact. Facial sensation intact. Face, tongue, palate moves normally and symmetrically.  Motor: Normal bulk and tone.  Mild weakness in left hand and intrinsic hand muscles. Tone is increased in both lower extremities right greater than left. Resting continuous tremor in the right leg which cannot be suppressed.  Sensory.: intact to touch ,pinprick .position and vibratory sensation.  Coordination: Rapid alternating movements normal in all extremities. Finger-to-nose and heel-to-shin performed accurately bilaterally. Gait and Station: Arises from chair with  difficulty. Walks with a walker with the bizarre gait with dragging of the left leg and tremor in the right leg which improves as he walks. Reflexes: Deep tendon reflexes are all brisk Toes downgoing.     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 He has gait disturbance due to combination of post stroke spasticity and anxiety tremor in his legs    PLAN: I had a long d/w patient and his mother about his recent stroke, risk for recurrent stroke/TIAs, personally independently reviewed imaging studies and stroke evaluation results and answered questions.Continue aspirin 325 mg daily  for secondary stroke prevention and maintain strict control of hypertension with blood pressure goal below 130/90, diabetes with hemoglobin A1c goal below 6.5% and lipids with LDL cholesterol goal below 70 mg/dL. I also advised the patient to eat a healthy diet with plenty of whole grains, cereals, fruits and vegetables, exercise regularly and maintain ideal body weight .He was counseled to quit cocaine, smoking and alcohol and to use his walker at all times and about fall and safety precautions. Trial of baclofen 5 mg 3 times daily for his spasticity and muscle tightness and increase if tolerated after 2 weeks to 10 mg 3 times daily. I discussed possible side effects with the patient and mother and advised him to call me if needed. We will also asked for home health physical and occupational therapy for gait and balance training. Greater than 50% of time during this 25 minute visit was spent on counseling,explanation of diagnosis, planning of further management, discussion with patient and family and coordination of care about his stroke and difficulty walking. Followup in the future with stroke nurse practitioner in 3 months or call earlier if necessary Delia Heady, MD  Note: This document was prepared with digital dictation and possible smart phrase technology. Any transcriptional errors that result from this process are unintentional

## 2015-05-16 MED FILL — LISINOPRIL 40 MG TABLET: 40 | 30 days supply | Qty: 30 | Fill #1

## 2015-05-16 MED FILL — CARVEDILOL 6.25 MG TABLET: 6.25 | 30 days supply | Qty: 60 | Fill #1

## 2015-05-17 MED FILL — SPIRONOLACTONE 25 MG TABLET: 25 | 30 days supply | Qty: 15 | Fill #2

## 2015-05-17 MED FILL — FUROSEMIDE 40 MG TABLET: 40 | 30 days supply | Qty: 60 | Fill #3

## 2015-05-17 MED FILL — FLUoxetine HCL 20 MG CAPS: 20 | 30 days supply | Qty: 30 | Fill #1

## 2015-05-18 ENCOUNTER — Ambulatory Visit: Payer: Medicaid Other | Attending: Family Medicine | Admitting: Family Medicine

## 2015-05-18 ENCOUNTER — Encounter: Payer: Self-pay | Admitting: Family Medicine

## 2015-05-18 ENCOUNTER — Encounter: Payer: Self-pay | Admitting: Clinical

## 2015-05-18 VITALS — BP 138/89 | HR 66 | Temp 98.8°F | Resp 16 | Ht 68.0 in | Wt 150.0 lb

## 2015-05-18 DIAGNOSIS — I5021 Acute systolic (congestive) heart failure: Secondary | ICD-10-CM | POA: Diagnosis not present

## 2015-05-18 DIAGNOSIS — F17210 Nicotine dependence, cigarettes, uncomplicated: Secondary | ICD-10-CM | POA: Insufficient documentation

## 2015-05-18 DIAGNOSIS — I10 Essential (primary) hypertension: Secondary | ICD-10-CM | POA: Insufficient documentation

## 2015-05-18 DIAGNOSIS — Z7982 Long term (current) use of aspirin: Secondary | ICD-10-CM | POA: Insufficient documentation

## 2015-05-18 DIAGNOSIS — I5042 Chronic combined systolic (congestive) and diastolic (congestive) heart failure: Secondary | ICD-10-CM

## 2015-05-18 DIAGNOSIS — Z79899 Other long term (current) drug therapy: Secondary | ICD-10-CM | POA: Insufficient documentation

## 2015-05-18 DIAGNOSIS — F329 Major depressive disorder, single episode, unspecified: Secondary | ICD-10-CM

## 2015-05-18 MED ORDER — SPIRONOLACTONE 25 MG PO TABS: 25.0000 mg | ORAL_TABLET | Freq: Every day | ORAL | Status: DC

## 2015-05-18 MED ORDER — FLUOXETINE HCL 20 MG PO TABS: 40.0000 mg | ORAL_TABLET | Freq: Every day | ORAL | Status: DC

## 2015-05-18 MED ORDER — FUROSEMIDE 40 MG PO TABS: 40.0000 mg | ORAL_TABLET | Freq: Every day | ORAL | Status: DC

## 2015-05-18 NOTE — Patient Instructions (Addendum)
Jaylan was seen today for hypertension.  Diagnoses and all orders for this visit:  Major depression, chronic (HCC) -     FLUoxetine (PROZAC) 20 MG tablet; Take 2 tablets (40 mg total) by mouth daily.  Acute systolic CHF (congestive heart failure), NYHA class 1 (HCC) -     furosemide (LASIX) 40 MG tablet; Take 1 tablet (40 mg total) by mouth daily. -     spironolactone (ALDACTONE) 25 MG tablet; Take 1 tablet (25 mg total) by mouth daily.   Eat salt free crackers or some bread before taking your medications in the morning   F.u in 8 weeks for HTN and depression  Dr. Armen Pickup

## 2015-05-18 NOTE — Assessment & Plan Note (Signed)
Chronic depression Increase prozac dose

## 2015-05-18 NOTE — Progress Notes (Signed)
Depression screen [facility] 2/9 05/18/2015 04/19/2015 04/17/2015 03/12/2015 03/12/2015  Decreased Interest 2 3 2 3 3   Down, Depressed, Hopeless 2 2 1 3 3   PHQ - 2 Score 4 5 3 6 6   Altered sleeping 2 2 2  0 -  Tired, decreased energy 2 2 3 3  -  Change in appetite 3 3 1 3  -  Feeling bad or failure about yourself  2 3 2 3  -  Trouble concentrating 2 3 0 3 -  Moving slowly or fidgety/restless 1 0 1 3 -  Suicidal thoughts 0 0 0 1 -  PHQ-9 Score 16 18 12 22  -  Difficult doing work/chores - Very difficult - - -    GAD 7 : Generalized Anxiety Score 05/18/2015 04/17/2015 03/12/2015  Nervous, Anxious, on Edge 2 2 3   Control/stop worrying 2 2 3   Worry too much - different things 1 3 3   Trouble relaxing 1 1 3   Restless 1 0 3  Easily annoyed or irritable 3 3 3   Afraid - awful might happen 1 1 1   Total GAD 7 Score 11 12 19

## 2015-05-18 NOTE — Progress Notes (Signed)
One month F/U -stroke  No pain today  No suicidal thoughts in the past two weeks  No tobacco user  No pain today

## 2015-05-18 NOTE — Progress Notes (Signed)
Subjective:  Patient ID: [name], male    DOB: 15-Mar-1965  Age: 50 y.o. MRN: [id number]  CC: Hypertension   HPI [name] has HTN, CHF, stroke, depression, hx of ETOH abuse presents with his mother  for    1. HTN: taking all meds. No HA, CP or SOB. He has CHF wearing compression stockings to prevent swelling.  2. Irritable: he is irritated easily. Drinking ETOH at times. Having trouble sleeping. Taking paxil 20 mg daily.   3. Polypharmacy: taking all meds. His sister puts his weekly pill box together. He takes AM meds on empty stomach many days of the weeks and has nausea and emesis. Has decreased appetite.   4. Urinary hesitancy: improved since adding bethanechol. Sometimes has urgency now. No dysuria. Using a urinal at night.     Social History  Substance Use Topics  . Smoking status: Current Every Day Smoker -- 0.25 packs/day for 20 years    Types: Cigarettes  . Smokeless tobacco: Not on file     Comment: ONLY SMOKES WHEN HE IS STRESS OUT  . Alcohol Use: 0.6 oz/week    1 Cans of beer, 0 Standard drinks or equivalent per week     Comment: soccially     Outpatient Prescriptions Prior to Visit  Medication Sig Dispense Refill  . acetaminophen-codeine (TYLENOL #3) 300-30 MG tablet Take 1-2 tablets by mouth every 8 (eight) hours as needed for moderate pain. 90 tablet 0  . aspirin 325 MG EC tablet Take 1 tablet (325 mg total) by mouth daily. 30 tablet 11  . baclofen (LIORESAL) 10 MG tablet Take 0.5 tablets (5 mg total) by mouth 3 (three) times daily. 90 tablet 1  . bethanechol (URECHOLINE) 10 MG tablet Take 1 tablet (10 mg total) by mouth 4 (four) times daily. 120 tablet 3  . carvedilol (COREG) 6.25 MG tablet Take 1 tablet (6.25 mg total) by mouth 2 (two) times daily with a meal. 60 tablet 1  . FLUoxetine (PROZAC) 20 MG tablet Take 1 tablet (20 mg total) by mouth daily. 30 tablet 3  . fluticasone (FLONASE) 50 MCG/ACT nasal spray Place 1 spray into both nostrils  daily.  2  . folic acid (FOLVITE) 1 MG tablet Take 1 tablet (1 mg total) by mouth daily. 30 tablet 1  . furosemide (LASIX) 40 MG tablet Take 2 tablets (80 mg total) by mouth daily. 60 tablet 11  . lidocaine (XYLOCAINE) 5 % ointment Apply 1 application topically as needed. Apply to low back 35.44 g 0  . lisinopril (PRINIVIL,ZESTRIL) 40 MG tablet Take 1 tablet (40 mg total) by mouth daily. 30 tablet 3  . magnesium oxide (MAG-OX) 400 (241.3 Mg) MG tablet Take 1 tablet (400 mg total) by mouth daily. 30 tablet 1  . Multiple Vitamin (MULTIVITAMIN WITH MINERALS) TABS tablet Take 1 tablet by mouth daily.    . polyethylene glycol powder (GLYCOLAX/MIRALAX) powder Take 17 g by mouth daily. 3350 g 1  . spironolactone (ALDACTONE) 25 MG tablet Take 0.5 tablets (12.5 mg total) by mouth daily. 60 tablet 11   No facility-administered medications prior to visit.    ROS Review of Systems  Constitutional: Negative for fever, chills, fatigue and unexpected weight change.  Eyes: Negative for visual disturbance.  Respiratory: Negative for cough and shortness of breath.   Cardiovascular: Negative for chest pain, palpitations and leg swelling.  Gastrointestinal: Positive for nausea and constipation. Negative for vomiting, abdominal pain, diarrhea and blood in stool.  Endocrine: Negative for polydipsia, polyphagia and polyuria.  Genitourinary: Positive for frequency. Negative for dysuria, urgency, hematuria, flank pain, decreased urine volume, discharge, penile swelling, scrotal swelling, enuresis, difficulty urinating, genital sores, penile pain and testicular pain.  Musculoskeletal: Positive for back pain. Negative for myalgias, arthralgias, gait problem and neck pain.  Skin: Negative for rash.  Allergic/Immunologic: Negative for immunocompromised state.  Neurological: Positive for weakness.  Hematological: Negative for adenopathy. Does not bruise/bleed easily.  Psychiatric/Behavioral: Negative for suicidal  ideas, sleep disturbance and dysphoric mood. The patient is not nervous/anxious.     Objective:  BP 138/89 mmHg  Pulse 66  Temp(Src) 98.8 F (37.1 C) (Oral)  Resp 16  Ht  (1.727 m)  Wt 150 lb (68.04 kg)  BMI 22.81 kg/m2  SpO2 98%  BP/Weight 05/18/2015 05/14/2015 04/19/2015  Systolic BP 138 160 156  Diastolic BP 89 103 98  Wt. (Lbs) 150 146.8 -  BMI 22.81 22.33 -    Physical Exam  Constitutional: He appears well-developed and well-nourished. No distress.  HENT:  Head: Normocephalic and atraumatic.  Neck: Normal range of motion. Neck supple.  Cardiovascular: Normal rate, regular rhythm, normal heart sounds and intact distal pulses.   Pulmonary/Chest: Effort normal and breath sounds normal.  Abdominal: Soft. Bowel sounds are normal. He exhibits no distension and no mass. There is no tenderness. There is no rebound, no guarding and no CVA tenderness.  Musculoskeletal: He exhibits no edema.       Lumbar back: He exhibits decreased range of motion, pain and spasm. He exhibits no bony tenderness, no swelling, no edema, no deformity and no laceration.  Pain and spasm noted a L4 b/l paraspinal muscles down to the S1 level   Neurological: He is alert.  Weakness in L leg, using walker and AFO  Skin: Skin is warm and dry. No rash noted. No erythema.  Psychiatric: He has a normal mood and affect.   Lab Results  Component Value Date   HGBA1C 5.2 02/25/2015   Depression screen [facility] 2/9 05/18/2015 04/19/2015 04/17/2015  Decreased Interest Down, Depressed, Hopeless PHQ - 2 Score Altered sleeping Tired, decreased energy Change in appetite Feeling bad or failure about yourself  Trouble concentrating 2 3 0  Moving slowly or fidgety/restless 1 0 1  Suicidal thoughts 0 0 0  PHQ-9 Score Difficult doing work/chores - Very difficult -   GAD 7 : Generalized Anxiety Score 05/18/2015 04/17/2015 03/12/2015  Nervous, Anxious, on Edge Control/stop worrying Worry too much - different things Trouble relaxing Restless 1 0 3  Easily annoyed or irritable Afraid - awful might happen Total GAD 7 Score He had episode of nausea and emesis today  Treated with zofran 8 mg PO x one, ginger ale and teddy grahams  Assessment & Plan:   [name] was seen today for hypertension.  Diagnoses and all orders for this visit:  Major depression, chronic (HCC) -     FLUoxetine (PROZAC) 20 MG tablet; Take 2 tablets (40 mg total) by mouth daily.  Acute systolic CHF (congestive heart failure), NYHA class 1 (HCC) -     furosemide (LASIX) 40 MG tablet; Take  1 tablet (40 mg total) by mouth daily. -     spironolactone (ALDACTONE) 25 MG tablet; Take 1 tablet (25 mg total) by mouth daily.    Follow-up: No Follow-up on file.   [name] MD

## 2015-05-18 NOTE — Assessment & Plan Note (Signed)
Fluid balanced Distressed by urinary frequency with lasix   Decrease lasix to 40 mg daily Increase aldactone to 25 mg daily

## 2015-05-18 NOTE — Assessment & Plan Note (Signed)
BP at goal Continue current meds with dose adjustment per orders Decrease lasix to 40 mg daily Increase aldactone to 25 mg daily

## 2015-05-22 ENCOUNTER — Telehealth: Payer: Self-pay | Admitting: Neurology

## 2015-05-22 NOTE — Telephone Encounter (Signed)
Loraine Leriche Watson/[facility] [phone] called back, in addition to previous message, needs verbal orders for OT consult and home health aid, states patient is new to Medicaid, there may be delay as they are working on authorization.

## 2015-05-22 NOTE — Telephone Encounter (Signed)
[name] with [facility] called for ver bal orders: PT 2 x a week for 6 weeks, skilled nursing consult. He also wanted to give update, his phone dropped the call. I was unable to get phone number.

## 2015-05-22 NOTE — Telephone Encounter (Signed)
LFf vm for [name] at [facility] about verbal is for OT, PT and home health aide per Dr.Sethi.

## 2015-05-22 NOTE — Telephone Encounter (Signed)
Rn receive a incoming call from [name](PT) at [facility]. Rn gave verbal orders for nurse to do disease process and education on patients stroke,and medication management.

## 2015-05-23 ENCOUNTER — Telehealth: Payer: Self-pay | Admitting: Family Medicine

## 2015-05-23 NOTE — Telephone Encounter (Signed)
Pt. Mother called to let pt. PCP know that she would like all of the pt. Medications to be kept in the [facility] pharmacy.

## 2015-05-30 ENCOUNTER — Telehealth: Payer: Self-pay

## 2015-05-30 NOTE — Telephone Encounter (Signed)
Rn receive a report form [facility].stating patient only requested home physical therapy, and does not need OT. In the report pt is independent with sponge bathing and dressing, and has assistance from family with kitchen task. Report sent to Dr.Sethi.   Rn call [facility] and talk to Lavona Mound assistance director at [phone]. Rn ask who referred the patient to [facility] for personal care services. V[facility] Clock stated that the patient referred himself to the agency. Rn stated Dr.Sethi referred the patient to [facility] home care for therapy not a home health aide. Rn stated form will have to be fax to his PCP. Treva verbalized understanding and stated a form was fax to PCP. Rn stated Dr. Pearlean Brownie cannot filled form out.

## 2015-05-31 NOTE — Telephone Encounter (Signed)
Jean/[facility] [phone] called requesting VO for a pill box and social worker

## 2015-05-31 NOTE — Telephone Encounter (Signed)
LFt vm giving verbal order per Dr.Sethi for pill  Box reminder and social worker for patient on [place].

## 2015-06-06 ENCOUNTER — Telehealth: Payer: Self-pay | Admitting: Neurology

## 2015-06-06 ENCOUNTER — Telehealth: Payer: Self-pay | Admitting: Pharmacist Clinician (PhC)/ Clinical Pharmacy Specialist

## 2015-06-06 NOTE — Telephone Encounter (Signed)
Spoke with patient, he will take extra dose of spironolactone today (25 mg).  Willing to come to office for BP check, scheduled for Friday May 26 at 2pm

## 2015-06-06 NOTE — Telephone Encounter (Signed)
[name] a physical therapist with [facility] is calling regarding the patient. He states he is at the patient's home now and his BP is 195/105. All other vitals are normal. The patient says he feels fine. Please call and discuss. Luisa Hart can be reached at [phone].

## 2015-06-06 NOTE — Telephone Encounter (Signed)
Called Luisa Hart and advised that he needs to call patient's PCP and/or cardiologist for hypertension. Dr Pearlean Brownie sees patient for tremors, gait difficulty, hemiparesis. Gave him names of both physicians per Epic.  Luisa Hart verbalized understanding, agreement, appreciation for call back.

## 2015-06-06 NOTE — Telephone Encounter (Signed)
[name] (physical therapy) called after seeing [name] today.  Was noted to have BP of 190/104 x 2 in the L arm, right arm similar.  Reports all other vitals WNL.  Reports patient states feeling fine, no concerns.    Reviewed chart, pt saw [name] in March, was to follow with PharmD in April due to uncontrolled BP in setting of post CVA, heart failure and substance abuse.  Did not keep appointment, mother felt it was not necessary to come, as he was non-compliant with medications.  Was reported that his sister controls his medication, but he doesn't live with her.  Would stop at his mother's house occasionally to check BP, but all readings were elevated, with diastolic always > 100.

## 2015-06-08 ENCOUNTER — Telehealth: Payer: Self-pay | Admitting: Neurology

## 2015-06-08 NOTE — Telephone Encounter (Signed)
I spoke to [place] at [facility] about [name].  He had wanted to let me know [name] was on 2 diuretic medications (spironolactone and Lasix).    I let him know that I would forward this to Dr. Pearlean Brownie and I also recommended that [name] find a primary care doctor since Dr. Pearlean Brownie is not following for all of his medical issues.

## 2015-06-10 ENCOUNTER — Encounter (HOSPITAL_BASED_OUTPATIENT_CLINIC_OR_DEPARTMENT_OTHER): Payer: Medicaid Other

## 2015-06-11 NOTE — Telephone Encounter (Signed)
thanks

## 2015-06-15 ENCOUNTER — Ambulatory Visit: Payer: Medicaid Other | Admitting: Pharmacist Clinician (PhC)/ Clinical Pharmacy Specialist

## 2015-06-19 ENCOUNTER — Encounter: Payer: Self-pay | Admitting: Family Medicine

## 2015-06-19 DIAGNOSIS — M47816 Spondylosis without myelopathy or radiculopathy, lumbar region: Secondary | ICD-10-CM | POA: Insufficient documentation

## 2015-06-19 NOTE — Progress Notes (Signed)
Filled out PCS form for [facility]

## 2015-06-20 ENCOUNTER — Encounter: Payer: Self-pay | Admitting: Physical Medicine & Rehabilitation

## 2015-06-20 ENCOUNTER — Encounter: Payer: Medicaid Other | Attending: Physical Medicine & Rehabilitation | Admitting: Physical Medicine & Rehabilitation

## 2015-06-20 VITALS — BP 173/111 | HR 76 | Resp 16

## 2015-06-20 DIAGNOSIS — Z09 Encounter for follow-up examination after completed treatment for conditions other than malignant neoplasm: Secondary | ICD-10-CM | POA: Diagnosis present

## 2015-06-20 DIAGNOSIS — R51 Headache: Secondary | ICD-10-CM | POA: Insufficient documentation

## 2015-06-20 DIAGNOSIS — I429 Cardiomyopathy, unspecified: Secondary | ICD-10-CM | POA: Insufficient documentation

## 2015-06-20 DIAGNOSIS — R339 Retention of urine, unspecified: Secondary | ICD-10-CM | POA: Insufficient documentation

## 2015-06-20 DIAGNOSIS — I69354 Hemiplegia and hemiparesis following cerebral infarction affecting left non-dominant side: Secondary | ICD-10-CM | POA: Insufficient documentation

## 2015-06-20 DIAGNOSIS — R269 Unspecified abnormalities of gait and mobility: Secondary | ICD-10-CM | POA: Insufficient documentation

## 2015-06-20 DIAGNOSIS — I110 Hypertensive heart disease with heart failure: Secondary | ICD-10-CM | POA: Diagnosis not present

## 2015-06-20 DIAGNOSIS — Z87891 Personal history of nicotine dependence: Secondary | ICD-10-CM | POA: Diagnosis not present

## 2015-06-20 DIAGNOSIS — I5030 Unspecified diastolic (congestive) heart failure: Secondary | ICD-10-CM | POA: Insufficient documentation

## 2015-06-20 DIAGNOSIS — I69322 Dysarthria following cerebral infarction: Secondary | ICD-10-CM | POA: Diagnosis not present

## 2015-06-20 DIAGNOSIS — I10 Essential (primary) hypertension: Secondary | ICD-10-CM

## 2015-06-20 NOTE — Progress Notes (Signed)
Subjective:    Patient ID: [name], male    DOB: 1965/08/25, 50 y.o.   MRN: [id number]  HPI 50 year old right-handed male with history of hypertension, chronic headaches, nonischemic cardiomyopathy, diastolic congestive heart failure, tobacco and polysubstance abuse who presents for follow up right MCA infarct.  Last clinic visit 04/19/15.  Last visit he complained of issues with bowel/bladder, although that has resolved.  He was not in therapies on last visit, but it is in therapies at present [facility]), which has also improved his back pain.  He continues to use the AFO and walker for ambulation.  He saw Neurology and has a follow up appointment in July with plans for ?repeat CT.   Denies falls. He has not scheduled a follow up appointment with his PCP yet.    Pain Inventory Average Pain 5 Pain Right Now 6 My pain is stabbing, tingling and aching  In the last 24 hours, has pain interfered with the following? General activity 3 Relation with others 3 Enjoyment of life 3 What TIME of day is your pain at its worst? na Sleep (in general) NA  Pain is worse with: walking, sitting and standing Pain improves with: heat/ice, therapy/exercise and medication Relief from Meds: 1  Mobility walk with assistance use a walker how many minutes can you walk? 5 needs help with transfers transfers alone  Function I need assistance with the following:  bathing, meal prep, household duties and shopping  Neuro/Psych bladder control problems bowel control problems weakness numbness tingling trouble walking spasms confusion depression anxiety  Prior Studies Any changes since last visit?  no  Physicians involved in your care Primary care Jocelyn Funches   Family History  Problem Relation Age of Onset  . Hypertension Mother   . Hyperlipidemia Mother   . Hypertension Father   . Hypertension Sister   . Hypertension Brother   . Stroke Maternal Aunt    Social History   Social  History  . Marital Status: Single    Spouse Name: N/A  . Number of Children: N/A  . Years of Education: N/A   Social History Main Topics  . Smoking status: Current Every Day Smoker -- 0.25 packs/day for 20 years    Types: Cigarettes  . Smokeless tobacco: None     Comment: ONLY SMOKES WHEN HE IS STRESS OUT  . Alcohol Use: 0.6 oz/week    1 Cans of beer, 0 Standard drinks or equivalent per week     Comment: soccially   . Drug Use: Yes    Special: Cocaine     Comment: stopped using cocaine 11/19/14  . Sexual Activity: Not Asked   Other Topics Concern  . None   Social History Narrative   Past Surgical History  Procedure Laterality Date  . No past surgeries     Past Medical History  Diagnosis Date  . Hypertension   . Headache   . CHF (congestive heart failure) (HCC)   . Stroke (HCC) 02/24/2015   BP 206/131 mmHg  Pulse 75  Resp 16  SpO2 97%  Opioid Risk Score:   Fall Risk Score:  `1  Depression screen PHQ 2/9  Depression screen [facility] 2/9 06/20/2015 05/18/2015 04/19/2015 04/17/2015 03/12/2015 03/12/2015 12/06/2014  Decreased Interest 0 Down, Depressed, Hopeless 0 PHQ - 2 Score 0 Altered sleeping - 0 - 0  Tired,  decreased energy - 2 2 3 3  - 0  Change in appetite - 3 3 1 3  - 0  Feeling bad or failure about yourself  - 2 3 2 3  - 0  Trouble concentrating - 2 3 0 3 - 0  Moving slowly or fidgety/restless - 1 0 1 3 - 1  Suicidal thoughts - 0 0 0 1 - 0  PHQ-9 Score - 16 18 12 22  - 4  Difficult doing work/chores - - Very difficult - - - -    Review of Systems  Respiratory: Positive for shortness of breath.   Musculoskeletal: Positive for back pain.  Neurological: Positive for weakness. Negative for numbness.  All other systems reviewed and are negative.     Objective:   Physical Exam Constitutional: He appears well-developed and well-nourished. NAD HENT: Normocephalic and atraumatic.  Eyes: EOM and Conj are normal.    Cardiovascular: Normal rate and regular rhythm.  Respiratory: Effort normal and breath sounds normal. No respiratory distress.  GI: Soft. Bowel sounds are normal. He exhibits no distension.  Musculoskeletal: He exhibits no edema or tenderness.  Gait: Left foot drag. Neurological: He is alert and oriented.  Speech mildly dysarthric.  Awareness of deficits Motor: RUE/RLE: 5/5 proximal distal LUE: 4+-5/5 proximal distal LLE: Hip flexion, knee extension 3-/5, knee extension 3/5, ankle dorsi/plantarflexion 2+/5 No increase in tone noted.  DTRs 3+ LLE +Clonus at left foot Skin: Skin is warm and dry.  Psychiatric: He has a normal mood and affect. His behavior is normal     Assessment & Plan:  50 year old right-handed male with history of hypertension, chronic headaches, nonischemic cardiomyopathy, diastolic congestive heart failure, tobacco and polysubstance abuse who presents for follow up right MCA infarct.    1. Left-sided weakness (>>LLE) and dysarthria secondary to right MCA infarct on 02/24/15.  Cont PT/OT  Cont meds  Follow up Neurology with ?plans for repeat scan due to concern for ?repeat stroke  Will await results of Neurology plan  2. Hypertension  Significantly elevated SBP 206/131 today, pt did not take meds  Will take repeat blood pressure after pt took medication  Educated on importance  Pt to follow up with PCP and Cardiology  Cont meds, educated on importance  3. Abnormality of gait  Denies falls  Cont rolling walker for safety  Cont AFO LLE  RTC 3 months

## 2015-06-20 NOTE — Addendum Note (Signed)
Addended by: Maryla Morrow A on: 06/20/2015 10:17 AM   Modules accepted: Level of Service

## 2015-07-12 ENCOUNTER — Ambulatory Visit: Payer: Medicaid Other | Attending: Family Medicine | Admitting: Family Medicine

## 2015-07-12 ENCOUNTER — Other Ambulatory Visit: Payer: Self-pay

## 2015-07-12 ENCOUNTER — Encounter: Payer: Self-pay | Admitting: Family Medicine

## 2015-07-12 VITALS — BP 180/108 | HR 69 | Temp 98.8°F | Resp 16 | Ht 68.0 in | Wt 151.0 lb

## 2015-07-12 DIAGNOSIS — G909 Disorder of the autonomic nervous system, unspecified: Secondary | ICD-10-CM | POA: Diagnosis not present

## 2015-07-12 DIAGNOSIS — Z7982 Long term (current) use of aspirin: Secondary | ICD-10-CM | POA: Insufficient documentation

## 2015-07-12 DIAGNOSIS — F17210 Nicotine dependence, cigarettes, uncomplicated: Secondary | ICD-10-CM | POA: Insufficient documentation

## 2015-07-12 DIAGNOSIS — I10 Essential (primary) hypertension: Secondary | ICD-10-CM | POA: Diagnosis present

## 2015-07-12 DIAGNOSIS — F329 Major depressive disorder, single episode, unspecified: Secondary | ICD-10-CM | POA: Diagnosis present

## 2015-07-12 DIAGNOSIS — Z79899 Other long term (current) drug therapy: Secondary | ICD-10-CM | POA: Diagnosis not present

## 2015-07-12 MED ORDER — HYDRALAZINE HCL 10 MG PO TABS: 10.0000 mg | ORAL_TABLET | Freq: Three times a day (TID) | ORAL | Status: DC

## 2015-07-12 NOTE — Patient Instructions (Addendum)
[name] was seen today for hypertension and depression.  Diagnoses and all orders for this visit:  Accelerated hypertension -     hydrALAZINE (APRESOLINE) 10 MG tablet; Take 1 tablet (10 mg total) by mouth 3 (three) times daily.   Your EKG is unchanged. Adding hydralazine to help control BP and reduce risk of repeat stroke.  Call to schedule follow up with your cardiologist [name]  [phone]  F/u in 2 weeks with me for HTN   Dr. Armen Pickup

## 2015-07-12 NOTE — Progress Notes (Signed)
F/U htn and depression Taking medication as prescribed  Pain scale #6 No suicidal thoughts in the past two weeks  Elevated BP, pt took medication Lisinopril  in office at 10:00

## 2015-07-12 NOTE — Progress Notes (Signed)
Subjective:  Patient ID: [name], male    DOB: [birth year]  Age: 50 y.o. MRN: [id number]  CC: Hypertension and Depression   HPI JAEDEN MESSER HTN, CHF, stroke (02/2015 R MCA), depression, hx of ETOH abuse presents for    1. HTN: he reports compliance with all of his medications. He admits to drinking wine coolers. He denies cocaine. He reports CP last week during his family reunion. Pains come and go for less than one minute. Pain was associated  He denies HA, SOB and edema.     Social History  Substance Use Topics  . Smoking status: Current Every Day Smoker -- 0.25 packs/day for 20 years    Types: Cigarettes  . Smokeless tobacco: Not on file     Comment: ONLY SMOKES WHEN HE IS STRESS OUT  . Alcohol Use: 0.6 oz/week    1 Cans of beer, 0 Standard drinks or equivalent per week     Comment: soccially     Outpatient Prescriptions Prior to Visit  Medication Sig Dispense Refill  . aspirin 325 MG EC tablet Take 1 tablet (325 mg total) by mouth daily. 30 tablet 11  . baclofen (LIORESAL) 10 MG tablet Take 0.5 tablets (5 mg total) by mouth 3 (three) times daily. 90 tablet 1  . bethanechol (URECHOLINE) 10 MG tablet Take 1 tablet (10 mg total) by mouth 4 (four) times daily. 120 tablet 3  . carvedilol (COREG) 6.25 MG tablet Take 1 tablet (6.25 mg total) by mouth 2 (two) times daily with a meal. 60 tablet 1  . FLUoxetine (PROZAC) 20 MG tablet Take 2 tablets (40 mg total) by mouth daily. 60 tablet 3  . fluticasone (FLONASE) 50 MCG/ACT nasal spray Place 1 spray into both nostrils daily.  2  . folic acid (FOLVITE) 1 MG tablet Take 1 tablet (1 mg total) by mouth daily. 30 tablet 1  . furosemide (LASIX) 40 MG tablet Take 1 tablet (40 mg total) by mouth daily. 30 tablet 11  . lidocaine (XYLOCAINE) 5 % ointment Apply 1 application topically as needed. Apply to low back 35.44 g 0  . lisinopril (PRINIVIL,ZESTRIL) 40 MG tablet Take 1 tablet (40 mg total) by mouth daily. 30 tablet 3  .  magnesium oxide (MAG-OX) 400 (241.3 Mg) MG tablet Take 1 tablet (400 mg total) by mouth daily. 30 tablet 1  . Multiple Vitamin (MULTIVITAMIN WITH MINERALS) TABS tablet Take 1 tablet by mouth daily.    . polyethylene glycol powder (GLYCOLAX/MIRALAX) powder Take 17 g by mouth daily. 3350 g 1  . spironolactone (ALDACTONE) 25 MG tablet Take 1 tablet (25 mg total) by mouth daily. 30 tablet 11  . acetaminophen-codeine (TYLENOL #3) 300-30 MG tablet Take 1-2 tablets by mouth every 8 (eight) hours as needed for moderate pain. (Patient not taking: Reported on 07/12/2015) 90 tablet 0   No facility-administered medications prior to visit.    ROS Review of Systems  Constitutional: Negative for fever, chills, fatigue and unexpected weight change.  Eyes: Negative for visual disturbance.  Respiratory: Negative for cough and shortness of breath.   Cardiovascular: Positive for chest pain. Negative for palpitations and leg swelling.  Gastrointestinal: Negative for nausea, vomiting, abdominal pain, diarrhea, constipation and blood in stool.  Endocrine: Negative for polydipsia, polyphagia and polyuria.  Genitourinary: Negative for dysuria, urgency, frequency, hematuria, flank pain, decreased urine volume, discharge, penile swelling, scrotal swelling, enuresis, difficulty urinating, genital sores, penile pain and testicular pain.  Musculoskeletal: Negative for myalgias, back  pain, arthralgias, gait problem and neck pain.  Skin: Negative for rash.  Allergic/Immunologic: Negative for immunocompromised state.  Neurological: Positive for weakness (in L leg ).  Hematological: Negative for adenopathy. Does not bruise/bleed easily.  Psychiatric/Behavioral: Negative for suicidal ideas, sleep disturbance and dysphoric mood. The patient is not nervous/anxious.     Objective:  BP 180/108 mmHg  Pulse 69  Temp(Src) 98.8 F (37.1 C) (Oral)  Resp 16  Ht 5\' 8"  (1.727 m)  Wt 151 lb (68.493 kg)  BMI 22.96 kg/m2  SpO2  99%  BP/Weight 07/12/2015 06/20/2015 05/18/2015  Systolic BP 180 173 138  Diastolic BP 108 111 89  Wt. (Lbs) 151 - 150  BMI 22.96 - 22.81   Physical Exam  Constitutional: He appears well-developed and well-nourished. No distress.  HENT:  Head: Normocephalic and atraumatic.  Neck: Normal range of motion. Neck supple.  Cardiovascular: Normal rate, regular rhythm, normal heart sounds and intact distal pulses.   Pulmonary/Chest: Effort normal and breath sounds normal.  Abdominal: Soft. Bowel sounds are normal. He exhibits no distension and no mass. There is no tenderness. There is no rebound, no guarding and no CVA tenderness.  Musculoskeletal: He exhibits no edema.       Lumbar back: He exhibits decreased range of motion, pain and spasm. He exhibits no bony tenderness, no swelling, no edema, no deformity and no laceration.  Pain and spasm noted a L4 b/l paraspinal muscles down to the S1 level   Neurological: He is alert.  Weakness in L leg, using walker and AFO  Skin: Skin is warm and dry. No rash noted. No erythema.  Psychiatric: He has a normal mood and affect.   EKG: normal EKG, normal sinus rhythm, unchanged from previous tracings.  Depression screen [facility] 2/9 07/12/2015 06/20/2015 05/18/2015 04/19/2015 04/17/2015  Decreased Interest 1 0 2 3 2   Down, Depressed, Hopeless 1 0 2 2 1   PHQ - 2 Score 2 0 4 5 3   Altered sleeping 0 - 2 2 2   Tired, decreased energy 2 - 2 2 3   Change in appetite 3 - 3 3 1   Feeling bad or failure about yourself  1 - 2 3 2   Trouble concentrating 1 - 2 3 0  Moving slowly or fidgety/restless 0 - 1 0 1  Suicidal thoughts 0 - 0 0 0  PHQ-9 Score 9 - 16 18 12   Difficult doing work/chores - - - Very difficult -   GAD 7 : Generalized Anxiety Score 07/12/2015 05/18/2015 04/17/2015 03/12/2015  Nervous, Anxious, on Edge 1 2 2 3   Control/stop worrying 1 2 2 3   Worry too much - different things 1 1 3 3   Trouble relaxing 2 1 1 3   Restless 1 1 0 3  Easily annoyed or irritable  2 3 3 3   Afraid - awful might happen 1 1 1 1   Total GAD 7 Score 9 11 12 19       Assessment & Plan:   There are no diagnoses linked to this encounter. Prithvi was seen today for hypertension and depression.  Diagnoses and all orders for this visit:  Accelerated hypertension -     hydrALAZINE (APRESOLINE) 10 MG tablet; Take 1 tablet (10 mg total) by mouth 3 (three) times daily. -     lisinopril (PRINIVIL,ZESTRIL) 40 MG tablet; Take 1 tablet (40 mg total) by mouth daily. -     carvedilol (COREG) 6.25 MG tablet; Take 1 tablet (6.25 mg total) by mouth 2 (two) times  daily with a meal.  Autonomic dysfunction    Meds ordered this encounter  Medications  . hydrALAZINE (APRESOLINE) 10 MG tablet    Sig: Take 1 tablet (10 mg total) by mouth 3 (three) times daily.    Dispense:  90 tablet    Refill:  3  . lisinopril (PRINIVIL,ZESTRIL) 40 MG tablet    Sig: Take 1 tablet (40 mg total) by mouth daily.    Dispense:  30 tablet    Refill:  5  . carvedilol (COREG) 6.25 MG tablet    Sig: Take 1 tablet (6.25 mg total) by mouth 2 (two) times daily with a meal.    Dispense:  60 tablet    Refill:  5    Follow-up: Return in about 2 weeks (around 07/26/2015) for HTN .   [name] MD

## 2015-07-15 DIAGNOSIS — G909 Disorder of the autonomic nervous system, unspecified: Secondary | ICD-10-CM | POA: Insufficient documentation

## 2015-07-15 MED ORDER — LISINOPRIL 40 MG PO TABS: 40.0000 mg | ORAL_TABLET | Freq: Every day | ORAL | Status: DC

## 2015-07-15 MED ORDER — CARVEDILOL 6.25 MG PO TABS: 6.2500 mg | ORAL_TABLET | Freq: Two times a day (BID) | ORAL | Status: DC

## 2015-07-15 NOTE — Assessment & Plan Note (Addendum)
A: HTN remains uncontrolled Med: compliant P: Add hydralazine 10 mg TID Continue coreg 6.25 mg BID  Continue lisinopril 40 mg daily  Continue lasix 40 mg daily Continue aldactone 25 mg daily   Close f/u in two weeks for HTN follow up

## 2015-07-19 ENCOUNTER — Telehealth: Payer: Self-pay | Admitting: Neurology

## 2015-07-19 NOTE — Telephone Encounter (Signed)
Caryn Bee Pulliam/[facility] [phone] called request VO for PT 1 x wk x 7 weeks.

## 2015-07-19 NOTE — Telephone Encounter (Signed)
error 

## 2015-07-19 NOTE — Telephone Encounter (Signed)
Ok, per Dr. Terrace Arabia, to continue PT.  Returned call to [facility] and provided verbal orders.

## 2015-07-29 ENCOUNTER — Ambulatory Visit (HOSPITAL_BASED_OUTPATIENT_CLINIC_OR_DEPARTMENT_OTHER): Payer: Medicaid Other | Attending: Cardiovascular Disease | Admitting: Cardiovascular Disease

## 2015-07-29 VITALS — Ht 68.0 in | Wt 153.0 lb

## 2015-07-29 DIAGNOSIS — Z79899 Other long term (current) drug therapy: Secondary | ICD-10-CM | POA: Diagnosis not present

## 2015-07-29 DIAGNOSIS — G4733 Obstructive sleep apnea (adult) (pediatric): Secondary | ICD-10-CM | POA: Diagnosis not present

## 2015-07-29 DIAGNOSIS — I119 Hypertensive heart disease without heart failure: Secondary | ICD-10-CM

## 2015-07-29 DIAGNOSIS — Z7982 Long term (current) use of aspirin: Secondary | ICD-10-CM | POA: Diagnosis not present

## 2015-07-29 DIAGNOSIS — R5383 Other fatigue: Secondary | ICD-10-CM | POA: Diagnosis not present

## 2015-07-29 DIAGNOSIS — R0683 Snoring: Secondary | ICD-10-CM

## 2015-08-02 ENCOUNTER — Ambulatory Visit: Payer: Medicaid Other | Admitting: Family Medicine

## 2015-08-05 ENCOUNTER — Encounter (HOSPITAL_BASED_OUTPATIENT_CLINIC_OR_DEPARTMENT_OTHER): Payer: Self-pay | Admitting: Cardiovascular Disease

## 2015-08-05 NOTE — Procedures (Signed)
Patient Name: Cole, Wells Date: 07/29/2015 Gender: Male D.O.B: Dec 12, 1965 Age (years): 49 Referring Provider: Chilton Si Height (inches): 68 Interpreting Physician: Nicki Guadalajara MD, ABSM Weight (lbs): 153 RPSGT: Cherylann Parr BMI: 23 MRN: [id number] Neck Size: 14.00  CLINICAL INFORMATION Sleep Study Type: NPSG Indication for sleep study: Fatigue, Hypertension, Snoring, Witnessed Apneas Epworth Sleepiness Score: 7  SLEEP STUDY TECHNIQUE As per the AASM Manual for the Scoring of Sleep and Associated Events v2.3 (April 2016) with a hypopnea requiring 4% desaturations. The channels recorded and monitored were frontal, central and occipital EEG, electrooculogram (EOG), submentalis EMG (chin), nasal and oral airflow, thoracic and abdominal wall motion, anterior tibialis EMG, snore microphone, electrocardiogram, and pulse oximetry.  MEDICATIONS spironolactone (ALDACTONE) 25 MG tablet 25 mg, Daily     polyethylene glycol powder (GLYCOLAX/MIRALAX) powder 17 g, Daily     Multiple Vitamin (MULTIVITAMIN WITH MINERALS) TABS tablet 1 tablet, Daily     magnesium oxide (MAG-OX) 400 (241.3 Mg) MG tablet 400 mg, Daily     lisinopril (PRINIVIL,ZESTRIL) 40 MG tablet 40 mg, Daily     lidocaine (XYLOCAINE) 5 % ointment 1 application, As needed     HYDROcodone-acetaminophen (NORCO) 10-325 MG tablet 1 tablet, Every 6 hours PRN     hydrALAZINE (APRESOLINE) 10 MG tablet 10 mg, 3 times daily     furosemide (LASIX) 40 MG tablet 40 mg, Daily     folic acid (FOLVITE) 1 MG tablet 1 mg, Daily     fluticasone (FLONASE) 50 MCG/ACT nasal spray 1 spray, Daily     FLUoxetine (PROZAC) 20 MG tablet 40 mg, Daily     carvedilol (COREG) 6.25 MG tablet 6.25 mg, 2 times daily with meals     bethanechol (URECHOLINE) 10 MG tablet 10 mg, 4 times daily     baclofen (LIORESAL) 10 MG tablet 5 mg, 3 times daily     aspirin 325 MG EC tablet 325 mg, Daily    Medications self-administered by patient  during sleep study : No sleep medicine administered.  SLEEP ARCHITECTURE The study was initiated at 10:05:00 PM and ended at 4:56:28 AM. Sleep onset time was 18.5 minutes and the sleep efficiency was 79.0%. The total sleep time was 325.0 minutes. Wake after sleep onset (WASO) was 68.0 minutes. Stage REM latency was 46.0 minutes. The patient spent 4.92% of the night in stage N1 sleep, 74.46% in stage N2 sleep, 0.15% in stage N3 and 20.46% in REM. Alpha intrusion was absent. Supine sleep was 32.48%.  RESPIRATORY PARAMETERS The overall apnea/hypopnea index (AHI) was 6.3 per hour. There were 6 total apneas, including 6 obstructive, 0 central and 0 mixed apneas. There were 28 hypopneas and 2 RERAs. The AHI during Stage REM sleep was 11.7 per hour. AHI while supine was 10.8 per hour. The mean oxygen saturation was 93.99%. The minimum SpO2 during sleep was 87.00%. Moderate snoring was noted during this study.  CARDIAC DATA The 2 lead EKG demonstrated sinus rhythm, pacemaker generated. The mean heart rate was 66.96 beats per minute. Other EKG findings include: None.  LEG MOVEMENT DATA The total PLMS were 155 with a resulting PLMS index of 28.62. Associated arousal with leg movement index was 0.0 .  IMPRESSIONS - Mild obstructive sleep apnea/hyponea syndrome (AHI = 6.3/h); events were more pronounced with supine sleep and REM sleep. - No significant central sleep apnea occurred during this study (CAI = 0.0/h). - Mild oxygen desaturation to a nadir of 87.00%. - The patient snored with Moderate snoring  volume. - No cardiac abnormalities were noted during this study. - Moderate periodic limb movements of sleep occurred during the study. No significant associated arousals.  DIAGNOSIS - Obstructive Sleep Apnea (327.23 [G47.33 ICD-10])  RECOMMENDATIONS - In this patient with cardiovascular comorbidities including accelerated hypertension and prior stroke, recommend CPAP titration trial for  treatment of sleep apnea/hypopnea syndrome. - Efforts should be made to optimize nasal and oropharyngeal patency. - The patient should be counseled to try avoiding supine sleep - Avoid alcohol, sedatives and other CNS depressants that may worsen sleep apnea and disrupt normal sleep architecture. - Sleep hygiene should be reviewed to assess factors that may improve sleep quality.   [Electronically signed] 08/05/2015 11:07 PM  Nicki Guadalajara MD,FACC, ABSM Diplomate, American Board of Sleep Medicine   NPI: [id number]  [facility] PH: [phone]   FX: [phone] ACCREDITED BY THE AMERICAN ACADEMY OF SLEEP MEDICINE

## 2015-08-09 ENCOUNTER — Other Ambulatory Visit: Payer: Self-pay | Admitting: *Deleted

## 2015-08-09 ENCOUNTER — Telehealth: Payer: Self-pay | Admitting: *Deleted

## 2015-08-09 DIAGNOSIS — G4733 Obstructive sleep apnea (adult) (pediatric): Secondary | ICD-10-CM

## 2015-08-09 NOTE — Progress Notes (Signed)
Patient notified

## 2015-08-09 NOTE — Telephone Encounter (Signed)
Left detailed sleep study results and recommendations on cell phone. Okay per signed Dpr. CPAP titration ordered.

## 2015-08-10 ENCOUNTER — Telehealth: Payer: Self-pay | Admitting: Cardiovascular Disease

## 2015-08-10 NOTE — Telephone Encounter (Signed)
Follow-up ° ° ° °The pt is returning the nurses phone call °

## 2015-08-13 ENCOUNTER — Ambulatory Visit: Payer: Medicaid Other | Admitting: Nurse Practitioner

## 2015-08-13 ENCOUNTER — Encounter: Payer: Self-pay | Admitting: Nurse Practitioner

## 2015-08-13 VITALS — BP 154/97 | HR 89 | Ht 68.0 in | Wt 150.4 lb

## 2015-08-13 DIAGNOSIS — R251 Tremor, unspecified: Secondary | ICD-10-CM | POA: Diagnosis not present

## 2015-08-13 DIAGNOSIS — I63511 Cerebral infarction due to unspecified occlusion or stenosis of right middle cerebral artery: Secondary | ICD-10-CM

## 2015-08-13 DIAGNOSIS — G8110 Spastic hemiplegia affecting unspecified side: Secondary | ICD-10-CM

## 2015-08-13 DIAGNOSIS — R269 Unspecified abnormalities of gait and mobility: Secondary | ICD-10-CM | POA: Diagnosis not present

## 2015-08-13 MED ORDER — BACLOFEN 10 MG PO TABS: 5.0000 mg | ORAL_TABLET | Freq: Three times a day (TID) | ORAL | 2 refills | Status: DC

## 2015-08-13 NOTE — Progress Notes (Signed)
[facility]  PATIENT: [name] DOB: 25-Jun-1965   REASON FOR VISIT: Follow-up for stroke HISTORY FROM: Patient    HISTORY OF PRESENT ILLNESS:UPDATE 07/24/2017CM Mr. Main 50 year old male returns for follow-up he has a history of stroke which occurred in February 2017. He denies further stroke or TIA symptoms since last seen. He is currently on aspirin 325 daily. His rehabilitation. He is doing home exercise program. He has intermittent assistance 4 times a week. He is using a walker to ambulate and denies any falls. He has AFO to left lower extremity. He denies current drug use or alcohol at present. He returns for reevaluation    HISTORY 05/14/15 PSMr. Barefoot is a 66 year Caucasian male seen today for the first office follow-up visit following hospital admission for stroke in February 2017. He is accompanied by his fiance.[name] is an 50 y.o. male history of hypertension and heart failure brought to the emergency room and code stroke status for acute onset of slurred speech, altered mental status and reduced movements of left extremities. Patient was last known well at 2:00 AM today. His fiance indicated that he had sudden change in mental status and speech was garbled and he was not moving his left side. EMS noticed lack of movement of left side as well as facial droop and slurred speech. He became agitated in route to the emergency room and was given milligrams of Versed. He received an additional 2 mg of Ativan, as well as 5 mg of Haldol. Patient remained markedly agitated. Initially appeared to have left-sided weakness which subsequently resolved, as did his gaze to the right side. CT scan of his head showed no acute intracranial abnormality. Patient was intubated and placed on mechanical ventilation, as well as on propofol for sedation. He has no previous history of stroke nor TIA. There was also no history of seizure activity. He reportedly had not  been drinking alcohol. Blood alcohol and urine drug screen are pending. Treatment intervention with TPA was deferred when patient's focal deficits resolved. CT scan of the head on admission showed no acute abnormality but MRI showed a small acute cortically based infarct in the right lateral peri-rolandic cortex. Carotid Doppler showed no significant extracranial stenosis. Transthoracic echo showed diminished ejection fraction of 15-20% with severe diffuse hypokinesis but no clot. LDL cholesterol was low at 21 mg percent and hemoglobin A1c was 5.2. Patient was not considered a good long-term candidate for anticoagulation given his substance abuse history and fall risk hence TEE and loop recorder, not considered. Patient was started on aspirin and was transferred to inpatient rehabilitation. He has made gradual improvement and is currently at home. He is able to walk with a walker but has noticed increased difficulty in walking particularly in the last week or so. His dragging not only his left leg affected by the stroke but also the right leg to. He also reports intermittent tremor of his right leg but she cannot suppress. He is living by himself but does have help with his family with sister providing food and medications. Family members drive him for his appointments. He had a fall last week while he was trying to take out the trash can. Patient was getting home physical and occupational therapy but this was discontinued as he did not have insurance. He however has recently got Medicaid approved and wants me to reorder it. He states his blood pressure is still poorly controlled and remains high at home and today it  is 162/102 in office. Is tolerating aspirin well without bleeding or other side effects. He states he has quit drugs and alcohol but it is hard to know whether this is true as he lives alone REVIEW OF SYSTEMS: Full 14 system review of systems performed and notable only for those listed, all others are  neg:  Constitutional: neg  Cardiovascular: neg Ear/Nose/Throat: neg  Skin: neg Eyes: neg Respiratory: neg Gastroitestinal: neg  Hematology/Lymphatic: neg  Endocrine: neg Musculoskeletal: Back pain Allergy/Immunology: neg Neurological: neg Psychiatric: neg Sleep : Restless legs   ALLERGIES: Allergies  Allergen Reactions  . Penicillins Other (See Comments)    Childhood     HOME MEDICATIONS: Outpatient Medications Prior to Visit  Medication Sig Dispense Refill  . aspirin 325 MG EC tablet Take 1 tablet (325 mg total) by mouth daily. 30 tablet 11  . baclofen (LIORESAL) 10 MG tablet Take 0.5 tablets (5 mg total) by mouth 3 (three) times daily. 90 tablet 1  . bethanechol (URECHOLINE) 10 MG tablet Take 1 tablet (10 mg total) by mouth 4 (four) times daily. 120 tablet 3  . carvedilol (COREG) 6.25 MG tablet Take 1 tablet (6.25 mg total) by mouth 2 (two) times daily with a meal. 60 tablet 5  . FLUoxetine (PROZAC) 20 MG tablet Take 2 tablets (40 mg total) by mouth daily. 60 tablet 3  . fluticasone (FLONASE) 50 MCG/ACT nasal spray Place 1 spray into both nostrils daily.  2  . folic acid (FOLVITE) 1 MG tablet Take 1 tablet (1 mg total) by mouth daily. 30 tablet 1  . furosemide (LASIX) 40 MG tablet Take 1 tablet (40 mg total) by mouth daily. 30 tablet 11  . hydrALAZINE (APRESOLINE) 10 MG tablet Take 1 tablet (10 mg total) by mouth 3 (three) times daily. 90 tablet 3  . lidocaine (XYLOCAINE) 5 % ointment Apply 1 application topically as needed. Apply to low back 35.44 g 0  . lisinopril (PRINIVIL,ZESTRIL) 40 MG tablet Take 1 tablet (40 mg total) by mouth daily. 30 tablet 5  . magnesium oxide (MAG-OX) 400 (241.3 Mg) MG tablet Take 1 tablet (400 mg total) by mouth daily. 30 tablet 1  . Multiple Vitamin (MULTIVITAMIN WITH MINERALS) TABS tablet Take 1 tablet by mouth daily.    . polyethylene glycol powder (GLYCOLAX/MIRALAX) powder Take 17 g by mouth daily. 3350 g 1  . spironolactone (ALDACTONE) 25  MG tablet Take 1 tablet (25 mg total) by mouth daily. 30 tablet 11  . HYDROcodone-acetaminophen (NORCO) 10-325 MG tablet Take 1 tablet by mouth every 6 (six) hours as needed.     No facility-administered medications prior to visit.     PAST MEDICAL HISTORY: Past Medical History:  Diagnosis Date  . CHF (congestive heart failure) (HCC)   . Headache   . Hypertension   . Stroke (HCC) 02/24/2015    PAST SURGICAL HISTORY: Past Surgical History:  Procedure Laterality Date  . NO PAST SURGERIES      FAMILY HISTORY: Family History  Problem Relation Age of Onset  . Hypertension Mother   . Hyperlipidemia Mother   . Hypertension Father   . Stroke Maternal Aunt   . Hypertension Sister   . Hypertension Brother     SOCIAL HISTORY: Social History   Social History  . Marital status: Single    Spouse name: N/A  . Number of children: N/A  . Years of education: N/A   Occupational History  . Not on file.   Social History Main Topics  .  Smoking status: Current Every Day Smoker    Packs/day: 0.10    Years: 20.00    Types: Cigarettes  . Smokeless tobacco: Not on file     Comment: ONLY SMOKES WHEN HE IS STRESS OUT  . Alcohol use 0.6 oz/week    1 Cans of beer per week     Comment: soccially   . Drug use:     Types: Cocaine     Comment: stopped using cocaine 11/19/14  . Sexual activity: Not on file   Other Topics Concern  . Not on file   Social History Narrative  . No narrative on file     PHYSICAL EXAM  Vitals:   08/13/15 0900  BP: (!) 154/97  Pulse: 89  Weight: 150 lb 6.4 oz (68.2 kg)  Height: 5\' 8"  (1.727 m)   Body mass index is 22.87 kg/m. General: well developed, well nourished, seated, in no evident distress Head: head normocephalic and atraumatic.  Neck: supple with no carotid  bruits Cardiovascular: regular rate and rhythm, no murmurs Musculoskeletal: no deformity Skin:  no rash/petichiae Vascular:  Normal pulses all extremities   Neurological  examination  Mental Status: Awake and fully alert. Oriented to place and time. Recent and remote memory intact. Attention span, concentration and fund of knowledge appropriate. Mood and affect appropriate.  Cranial Nerves:  Pupils equal, briskly reactive to light. Extraocular movements full without nystagmus. Visual fields full to confrontation. Hearing intact. Facial sensation intact. Face, tongue, palate moves normally and symmetrically.  Motor: Normal bulk and tone.  Mild weakness in left hand and intrinsic hand muscles. Tone is increased in both lower extremities left  greater than right.    Sensory.: intact to touch ,pinprick .position and vibratory sensation in upper and lower extremities.  Coordination: Rapid alternating movements normal in all extremities. Finger-to-nose and heel-to-shin performed accurately bilaterally. Gait and Station: Arises from chair with  difficulty. Walks with a walker . Obvious foot drop left leg.  Reflexes: Deep tendon reflexes are all brisk Toes downgoing.   DIAGNOSTIC DATA (LABS, IMAGING, TESTING) - I reviewed patient records, labs, notes, testing and imaging myself where available.  Lab Results  Component Value Date   WBC 6.4 02/27/2015   HGB 13.6 02/27/2015   HCT 40.8 02/27/2015   MCV 100.2 (H) 02/27/2015   PLT 153 02/27/2015      Component Value Date/Time   NA 139 04/17/2015 1058   K 4.2 04/17/2015 1058   CL 106 04/17/2015 1058   CO2 24 04/17/2015 1058   GLUCOSE 80 04/17/2015 1058   BUN 14 04/17/2015 1058   CREATININE 0.73 04/17/2015 1058   CALCIUM 9.6 04/17/2015 1058   PROT 7.0 04/17/2015 1058   ALBUMIN 4.1 04/17/2015 1058   AST 14 04/17/2015 1058   ALT 9 04/17/2015 1058   ALKPHOS 70 04/17/2015 1058   BILITOT 0.7 04/17/2015 1058   GFRNONAA >89 04/17/2015 1058   GFRAA >89 04/17/2015 1058   Lab Results  Component Value Date   CHOL 131 02/25/2015   HDL 83 02/25/2015   LDLCALC 21 02/25/2015   TRIG 134 02/25/2015   CHOLHDL 1.6  02/25/2015   Lab Results  Component Value Date   HGBA1C 5.2 02/25/2015    ASSESSMENT AND PLAN 49 year patient made with the 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 He has gait disturbance due to combination of post stroke spasticity .The patient is a current patient  of Dr. Pearlean Brownie  who is out of the office today . This note is sent to the work in doctor.     PLANContinue aspirin for secondary stroke prevention Strict control of hypertension with blood pressure goal below 130/90 today's reading 154/97 LDL cholesterol goal below 70 Continue using walker for safe ambulation perform home exercise program Continue baclofen for spasticity and muscle tightness Follow-up in 6 months Nilda Riggs, [facility], [facility], APRN  [facility] [address] [place], [place] [address] [phone]

## 2015-08-13 NOTE — Progress Notes (Signed)
I have read the note, and I agree with the clinical assessment and plan.  Richard A. Sater, MD, PhD Certified in Neurology, Clinical Neurophysiology, Sleep Medicine, Pain Medicine and Neuroimaging  [facility] [address], [address] [place], [place] [address] [phone]  

## 2015-08-13 NOTE — Patient Instructions (Addendum)
Continue aspirin for secondary stroke prevention Strict control of hypertension with blood pressure goal below 130/90 todays reading 154/97 LDL cholesterol goal below 70 Continue using walker for safe ambulation perform home exercise program Continue baclofen for spasticity and muscle tightness Follow-up in 6 months

## 2015-08-28 ENCOUNTER — Ambulatory Visit: Payer: Medicaid Other | Admitting: Cardiovascular Disease

## 2015-09-06 ENCOUNTER — Ambulatory Visit: Payer: Medicaid Other | Admitting: Cardiovascular Disease

## 2015-09-06 ENCOUNTER — Encounter: Payer: Self-pay | Admitting: *Deleted

## 2015-09-06 NOTE — Progress Notes (Deleted)
Cardiology Office Note   Date:  09/06/2015   ID:  [name], DOB 03/05/1965, MRN [id number]  PCP:  Lora Paula, MD  Cardiologist:   Chilton Si, MD   No chief complaint on file.   History of Present Illness: [name] is a 50 y.o. male with hypertension, prior CVA, chronic systolic and diastolic heart failure LVEF 15-20%, non-compliance and polysubstance abuse who presents for follow up.  [name] had a stroke 02/24/15 in the setting of cocaine abuse and hypertension.  He has residual L sided weakness.  He followed up with his PCP, Dr. Dessa Wells, who referred him to cardiology to establish care.  His prior cardiologist was Dr. Juanito Wells and he was last seen 11/2014.  At his appointment on 03/2015 his blood pressure was poorly-controlled.  However, his medication had been recently titrated and he had not yet increased his carvedilol and lisinopril.  He was instructed to follow up with pharmacy but cancelled his appointment.  His mother called to inform [name] that he is not taking his medications as prescribed.  He saw Dr. Armen Pickup on 4/28 and lasix was decreased while spironolactone was increased to 25 mg daily.  His blood pressure was well-controlled at that time.  He was referred for a sleep study    At that time his blood pressure was poorly-controlled.  His sister fills his medication container daily and cooks a low-salt diet.  However, he admits to adding salt to his food.  He does not follow any particular low salt or low fluid diet.  He does not weigh himself daily.  He was participating in rehab after his stroke and hopes that he will be able to continue this.  He does not get any other formal exercise.  [name] reports that he has not used any drugs or alcohol since his stroke.  He carries a large bag of candy that he uses to avoid drug use.  F/u sleep study  Past Medical History:  Diagnosis Date  . CHF (congestive heart failure) (HCC)   . Headache    . Hypertension   . Stroke [facility]) 02/24/2015    Past Surgical History:  Procedure Laterality Date  . NO PAST SURGERIES       Current Outpatient Prescriptions  Medication Sig Dispense Refill  . aspirin 325 MG EC tablet Take 1 tablet (325 mg total) by mouth daily. 30 tablet 11  . baclofen (LIORESAL) 10 MG tablet Take 0.5 tablets (5 mg total) by mouth 3 (three) times daily. 135 tablet 2  . bethanechol (URECHOLINE) 10 MG tablet Take 1 tablet (10 mg total) by mouth 4 (four) times daily. 120 tablet 3  . carvedilol (COREG) 6.25 MG tablet Take 1 tablet (6.25 mg total) by mouth 2 (two) times daily with a meal. 60 tablet 5  . FLUoxetine (PROZAC) 20 MG tablet Take 2 tablets (40 mg total) by mouth daily. 60 tablet 3  . fluticasone (FLONASE) 50 MCG/ACT nasal spray Place 1 spray into both nostrils daily.  2  . folic acid (FOLVITE) 1 MG tablet Take 1 tablet (1 mg total) by mouth daily. 30 tablet 1  . furosemide (LASIX) 40 MG tablet Take 1 tablet (40 mg total) by mouth daily. 30 tablet 11  . hydrALAZINE (APRESOLINE) 10 MG tablet Take 1 tablet (10 mg total) by mouth 3 (three) times daily. 90 tablet 3  . HYDROcodone-acetaminophen (NORCO/VICODIN) 5-325 MG tablet Take 1-2 tablets by mouth as needed for  moderate pain.    Marland Kitchen. lidocaine (XYLOCAINE) 5 % ointment Apply 1 application topically as needed. Apply to low back 35.44 g 0  . lisinopril (PRINIVIL,ZESTRIL) 40 MG tablet Take 1 tablet (40 mg total) by mouth daily. 30 tablet 5  . magnesium oxide (MAG-OX) 400 (241.3 Mg) MG tablet Take 1 tablet (400 mg total) by mouth daily. 30 tablet 1  . Multiple Vitamin (MULTIVITAMIN WITH MINERALS) TABS tablet Take 1 tablet by mouth daily.    . polyethylene glycol powder (GLYCOLAX/MIRALAX) powder Take 17 g by mouth daily. 3350 g 1  . spironolactone (ALDACTONE) 25 MG tablet Take 1 tablet (25 mg total) by mouth daily. 30 tablet 11   No current facility-administered medications for this visit.     Allergies:   Penicillins     Social History:  The patient  reports that he has been smoking Cigarettes.  He has a 2.00 pack-year smoking history. He has never used smokeless tobacco. He reports that he drinks about 0.6 oz of alcohol per week . He reports that he uses drugs, including Cocaine.   Family History:  The patient's family history includes Hyperlipidemia in his mother; Hypertension in his brother, father, mother, and sister; Stroke in his maternal aunt.    ROS:  Please see the history of present illness.   Otherwise, review of systems are positive for none.   All other systems are reviewed and negative.    PHYSICAL EXAM: VS:  There were no vitals taken for this visit. , BMI There is no height or weight on file to calculate BMI. GENERAL:  Chronically ill-appearing.   HEENT:  Pupils equal round and reactive, fundi not visualized, oral mucosa unremarkable NECK:  No jugular venous distention, waveform within normal limits, carotid upstroke brisk and symmetric, no bruits, no thyromegaly LYMPHATICS:  No cervical adenopathy LUNGS:  Clear to auscultation bilaterally HEART:  RRR.  PMI not displaced or sustained,S1 and S2 within normal limits, no S3, no S4, no clicks, no rubs, no murmurs ABD:  Flat, positive bowel sounds normal in frequency in pitch, no bruits, no rebound, no guarding, no midline pulsatile mass, no hepatomegaly, no splenomegaly EXT:  2 plus pulses throughout, no edema, no cyanosis no clubbing SKIN:  No rashes no nodules NEURO:  Cranial nerves II through XII grossly intact, motor grossly intact throughout.  Uncontrollable shaking of the R leg. PSYCH:  Cognitively intact, oriented to person place and time  EKG:  EKG is ordered today. The ekg ordered today demonstrates sinus rhythm rate 85 bpm.  LVH with repolarization abnormality.  Unchanged from prior.   Echo 02/25/15: Study Conclusions  - Left ventricle: The cavity size was mildly dilated. There was  moderate concentric hypertrophy. Systolic  function was normal.  The estimated ejection fraction was in the range of 15% to 20%.  Severe diffuse hypokinesis. Although no diagnostic regional wall  motion abnormality was identified, this possibility cannot be  completely excluded on the basis of this study. Doppler  parameters are consistent with abnormal left ventricular  relaxation (grade 1 diastolic dysfunction). - Mitral valve: There was mild regurgitation. - Left atrium: The atrium was moderately dilated.   Recent Labs: 02/27/2015: Hemoglobin 13.6; Platelets 153 03/23/2015: Magnesium 2.0 04/17/2015: ALT 9; BUN 14; Creat 0.73; Potassium 4.2; Sodium 139    Lipid Panel    Component Value Date/Time   CHOL 131 02/25/2015 0328   TRIG 134 02/25/2015 0328   HDL 83 02/25/2015 0328   CHOLHDL 1.6 02/25/2015 0328  VLDL 27 02/25/2015 0328   LDLCALC 21 02/25/2015 0328      Wt Readings from Last 3 Encounters:  08/13/15 150 lb 6.4 oz (68.2 kg)  07/29/15 153 lb (69.4 kg)  07/12/15 151 lb (68.5 kg)      ASSESSMENT AND PLAN:  # Chronic systolic and diastolic heart failure:  Mr. Marcinko appears to be euvolemic at this time.  He just started the higher doses of carvedilol and lisinopril this morning. I have asked him and his mother to check his BP daily and bring the results to a follow up appointment with Phillips Hay in 2 weeks.   We discussed the importance of limiting his salt and fluid intake.  His family has been very helpful in limiting his salt intake but he adds salt to the family that they give him.  We also discussed the importance of weighing himself daily.  Continue carvedilol, lisinopril, and spironolactone. Given his history of cocaine abuse as recently as a few months ago he is not a candidate for an ICD at this time. If he can abstain for from illicit substances for a more substantial period of time we will need to consider ICD.   # Hypertensive heart disease: Blood pressure is poorly-controlled as above. We have  asked him to keep a blood pressure log and to follow-up with our pharmacist in 2 weeks.  We will not make any changes at this time as he just started a new regimen this morning.  He reports that he has not been using cocaine lately. For now, we will continue carvedilol. If he continues to use cocaine in the future we will need to switch this to an alternative drug class given the potential interaction with cocaine and beta blockers.  # Snoring: Mr. Barrozo endorses daytime somnolence and heavy snoring. We will refer him for sleep study.  Epworth sleepiness scale was 9.   # Polysubstance abuse: We discussed the importance of abstinence from illicit substances. He has been using candy to deter him from drinking alcohol or using drugs. We suggested that he try healthier snack options such as fruit and vegetables.   Current medicines are reviewed at length with the patient today.  The patient does not have concerns regarding medicines.  The following changes have been made:  no change  Labs/ tests ordered today include:   No orders of the defined types were placed in this encounter.    Disposition:   FU with Geneal Huebert C. [name], MD, [facility] in 6 months.      This note was written with the assistance of speech recognition software.  Please excuse any transcriptional errors.  Signed, Jacy Howat C. [name], MD, [facility]  09/06/2015 8:02 AM    [facility]

## 2015-09-06 NOTE — Progress Notes (Signed)
Letter mailed

## 2015-09-20 ENCOUNTER — Encounter: Payer: Medicaid Other | Attending: Physical Medicine & Rehabilitation | Admitting: Physical Medicine & Rehabilitation

## 2015-10-07 ENCOUNTER — Ambulatory Visit (HOSPITAL_BASED_OUTPATIENT_CLINIC_OR_DEPARTMENT_OTHER): Payer: Medicaid Other | Attending: Cardiovascular Disease | Admitting: Cardiovascular Disease

## 2015-10-07 VITALS — Ht 68.0 in | Wt 147.0 lb

## 2015-10-07 DIAGNOSIS — G4733 Obstructive sleep apnea (adult) (pediatric): Secondary | ICD-10-CM | POA: Diagnosis not present

## 2015-10-09 ENCOUNTER — Ambulatory Visit: Payer: Medicaid Other | Attending: Family Medicine | Admitting: Family Medicine

## 2015-10-09 ENCOUNTER — Encounter: Payer: Self-pay | Admitting: Family Medicine

## 2015-10-09 VITALS — BP 183/127 | HR 99 | Temp 98.9°F | Ht 68.0 in | Wt 149.4 lb

## 2015-10-09 DIAGNOSIS — I509 Heart failure, unspecified: Secondary | ICD-10-CM | POA: Insufficient documentation

## 2015-10-09 DIAGNOSIS — I110 Hypertensive heart disease with heart failure: Secondary | ICD-10-CM | POA: Diagnosis present

## 2015-10-09 DIAGNOSIS — Z0000 Encounter for general adult medical examination without abnormal findings: Secondary | ICD-10-CM

## 2015-10-09 DIAGNOSIS — Z9114 Patient's other noncompliance with medication regimen: Secondary | ICD-10-CM | POA: Insufficient documentation

## 2015-10-09 DIAGNOSIS — Z8673 Personal history of transient ischemic attack (TIA), and cerebral infarction without residual deficits: Secondary | ICD-10-CM | POA: Diagnosis not present

## 2015-10-09 DIAGNOSIS — I10 Essential (primary) hypertension: Secondary | ICD-10-CM

## 2015-10-09 MED ORDER — CLONIDINE HCL 0.1 MG PO TABS: 0.1000 mg | ORAL_TABLET | Freq: Once | ORAL | Status: DC

## 2015-10-09 MED ORDER — CLONIDINE HCL 0.1 MG PO TABS
0.2000 mg | ORAL_TABLET | Freq: Once | ORAL | Status: AC
  Administered 2015-10-09: 0.2 mg via ORAL

## 2015-10-09 NOTE — Assessment & Plan Note (Addendum)
A: HTN with CHF, hx of CVA. Med non compliance Med: non compliant P:  Restart medications F/u in two weeks for BP check

## 2015-10-09 NOTE — Patient Instructions (Addendum)
[name] was seen today for hypertension.  Diagnoses and all orders for this visit:  Accelerated hypertension -     Discontinue: cloNIDine (CATAPRES) tablet 0.1 mg; Take 1 tablet (0.1 mg total) by mouth once. -     cloNIDine (CATAPRES) tablet 0.2 mg; Take 2 tablets (0.2 mg total) by mouth once.  Healthcare maintenance -     Ambulatory referral to Gastroenterology   Please go to [facility] to get refills of blood pressure medication  -lasix  -aldactone -coreg -lisinopril -hydralazine   F/u in 2 weeks for HTN bring medications to f/u appt  Dr. Armen Pickup

## 2015-10-09 NOTE — Progress Notes (Signed)
Subjective:  Patient ID: [name], male    DOB: 11-11-1965  Age: 50 y.o. MRN: [id number]  CC: Hypertension   HPI KIREN HAVRON HTN, systolic CHF ( EF 15-20%) , stroke (02/2015 R MCA), depression, hx of ETOH abuse presents for    1. HTN: he reports no blood pressure medicine for two weeks. He reports he was in [place] for an extended period of time helping with his father who was sick and ran out of medication.  He admits to drinking wine coolers. He denies cocaine. He denies HA, CP, SOB, cough and leg swelling.    Social History  Substance Use Topics  . Smoking status: Current Every Day Smoker    Packs/day: 0.10    Years: 20.00    Types: Cigarettes  . Smokeless tobacco: Never Used     Comment: ONLY SMOKES WHEN HE IS STRESS OUT  . Alcohol use 0.6 oz/week    1 Cans of beer per week     Comment: soccially     Outpatient Medications Prior to Visit  Medication Sig Dispense Refill  . aspirin 325 MG EC tablet Take 1 tablet (325 mg total) by mouth daily. 30 tablet 11  . baclofen (LIORESAL) 10 MG tablet Take 0.5 tablets (5 mg total) by mouth 3 (three) times daily. 135 tablet 2  . bethanechol (URECHOLINE) 10 MG tablet Take 1 tablet (10 mg total) by mouth 4 (four) times daily. 120 tablet 3  . carvedilol (COREG) 6.25 MG tablet Take 1 tablet (6.25 mg total) by mouth 2 (two) times daily with a meal. 60 tablet 5  . FLUoxetine (PROZAC) 20 MG tablet Take 2 tablets (40 mg total) by mouth daily. 60 tablet 3  . fluticasone (FLONASE) 50 MCG/ACT nasal spray Place 1 spray into both nostrils daily.  2  . folic acid (FOLVITE) 1 MG tablet Take 1 tablet (1 mg total) by mouth daily. 30 tablet 1  . furosemide (LASIX) 40 MG tablet Take 1 tablet (40 mg total) by mouth daily. 30 tablet 11  . hydrALAZINE (APRESOLINE) 10 MG tablet Take 1 tablet (10 mg total) by mouth 3 (three) times daily. 90 tablet 3  . HYDROcodone-acetaminophen (NORCO/VICODIN) 5-325 MG tablet Take 1-2 tablets by mouth as needed for  moderate pain.    Marland Kitchen lidocaine (XYLOCAINE) 5 % ointment Apply 1 application topically as needed. Apply to low back 35.44 g 0  . lisinopril (PRINIVIL,ZESTRIL) 40 MG tablet Take 1 tablet (40 mg total) by mouth daily. 30 tablet 5  . magnesium oxide (MAG-OX) 400 (241.3 Mg) MG tablet Take 1 tablet (400 mg total) by mouth daily. 30 tablet 1  . Multiple Vitamin (MULTIVITAMIN WITH MINERALS) TABS tablet Take 1 tablet by mouth daily.    . polyethylene glycol powder (GLYCOLAX/MIRALAX) powder Take 17 g by mouth daily. 3350 g 1  . spironolactone (ALDACTONE) 25 MG tablet Take 1 tablet (25 mg total) by mouth daily. 30 tablet 11   No facility-administered medications prior to visit.     ROS Review of Systems  Constitutional: Negative for chills, fatigue, fever and unexpected weight change.  Eyes: Negative for visual disturbance.  Respiratory: Negative for cough and shortness of breath.   Cardiovascular: Positive for chest pain. Negative for palpitations and leg swelling.  Gastrointestinal: Negative for abdominal pain, blood in stool, constipation, diarrhea, nausea and vomiting.  Endocrine: Negative for polydipsia, polyphagia and polyuria.  Genitourinary: Negative for decreased urine volume, difficulty urinating, discharge, dysuria, enuresis, flank pain, frequency, genital  sores, hematuria, penile pain, penile swelling, scrotal swelling, testicular pain and urgency.  Musculoskeletal: Negative for arthralgias, back pain, gait problem, myalgias and neck pain.  Skin: Negative for rash.  Allergic/Immunologic: Negative for immunocompromised state.  Neurological: Positive for weakness (in L leg ).  Hematological: Negative for adenopathy. Does not bruise/bleed easily.  Psychiatric/Behavioral: Negative for dysphoric mood, sleep disturbance and suicidal ideas. The patient is not nervous/anxious.     Objective:  BP (!) 203/136 (BP Location: Left Arm, Patient Position: Sitting, Cuff Size: Small)   Pulse 99   Temp  98.9 F (37.2 C) (Oral)   Ht 5\' 8"  (1.727 m)   Wt 149 lb 6.4 oz (67.8 kg)   SpO2 99%   BMI 22.72 kg/m   BP/Weight 10/09/2015 10/07/2015 08/13/2015  Systolic BP 203 - 154  Diastolic BP 136 - 97  Wt. (Lbs) 149.4 147 150.4  BMI 22.72 22.35 22.87   Physical Exam  Constitutional: He appears well-developed and well-nourished. No distress.  HENT:  Head: Normocephalic and atraumatic.  Neck: Normal range of motion. Neck supple.  Cardiovascular: Normal rate, regular rhythm, normal heart sounds and intact distal pulses.   Pulmonary/Chest: Effort normal and breath sounds normal.  Abdominal: Soft. Bowel sounds are normal. He exhibits no distension and no mass. There is no tenderness. There is no rebound, no guarding and no CVA tenderness.  Musculoskeletal: He exhibits no edema.       Lumbar back: He exhibits decreased range of motion, pain and spasm. He exhibits no bony tenderness, no swelling, no edema, no deformity and no laceration.  Neurological: He is alert.  Weakness in L leg, using walker and AFO  Skin: Skin is warm and dry. No rash noted. No erythema.  Psychiatric: He has a normal mood and affect.    Dep[facility] screen [facility] 2/9 10/09/2015 10/09/2015 07/12/2015 06/20/2015 05/18/2015  Decreased Interest 0 0 1 0 2  Down, Depressed, Hopeless 0 0 1 0 2  PHQ - 2 Score 0 0 2 0 4  Altered sleeping 2 - 0 - 2  Tired, decreased energy 1 - 2 - 2  Change in appetite 3 - 3 - 3  Feeling bad or failure about yourself  0 - 1 - 2  Trouble concentrating 1 - 1 - 2  Moving slowly or fidgety/restless 0 - 0 - 1  Suicidal thoughts 0 - 0 - 0  PHQ-9 Score 7 - 9 - 16  Difficult doing work/chores - - - - -   GAD 7 : Generalized Anxiety Score 10/09/2015 07/12/2015 05/18/2015 04/17/2015  Nervous, Anxious, on Edge 1 1 2 2   Control/stop worrying 1 1 2 2   Worry too much - different things 0 1 1 3   Trouble relaxing 1 2 1 1   Restless 1 1 1  0  Easily annoyed or irritable 3 2 3 3   Afraid - awful might happen 0 1 1 1     Total GAD 7 Score 7 9 11 12    Treated with clonidine 0.2 mg PO x one   Repeat BP 183/127    Assessment & Plan:   There are no diagnoses linked to this encounter. Mikle BosworthCarlos was seen today for hypertension.  Diagnoses and all orders for this visit:  Accelerated hypertension -     Discontinue: cloNIDine (CATAPRES) tablet 0.1 mg; Take 1 tablet (0.1 mg total) by mouth once. -     cloNIDine (CATAPRES) tablet 0.2 mg; Take 2 tablets (0.2 mg total) by mouth once.  Healthcare maintenance -  Ambulatory referral to Gastroenterology    No orders of the defined types were placed in this encounter.   Follow-up: Return in about 2 weeks (around 10/23/2015) for HTN .   [name] MD

## 2015-10-09 NOTE — Progress Notes (Signed)
Pt has went 2 weeks without BP medication.

## 2015-10-14 NOTE — Procedures (Signed)
Patient Name: [name], [name] Date: 10/07/2015 Gender: Male D.O.B: 07-15-1965 Age (years): 50 Referring Provider: Chilton Si Height (inches): 68 Interpreting Physician: Nicki Guadalajara MD, ABSM Weight (lbs): 153 RPSGT: Wylie Hail BMI: 23 MRN: [id number] Neck Size: 14.00  CLINICAL INFORMATION The patient is referred for a CPAP titration to treat sleep apnea. Date of NPSG, Split Night or HST: 07/29/15  AHI 6.3/h; RDI 6.6/h; AHI during REM sleep 11.7/h  SLEEP STUDY TECHNIQUE As per the AASM Manual for the Scoring of Sleep and Associated Events v2.3 (April 2016) with a hypopnea requiring 4% desaturations. The channels recorded and monitored were frontal, central and occipital EEG, electrooculogram (EOG), submentalis EMG (chin), nasal and oral airflow, thoracic and abdominal wall motion, anterior tibialis EMG, snore microphone, electrocardiogram, and pulse oximetry. Continuous positive airway pressure (CPAP) was initiated at the beginning of the study and titrated to treat sleep-disordered breathing.  MEDICATIONS aspirin 325 MG EC tablet carvedilol (COREG) 6.25 MG tablet furosemide (LASIX) 40 MG tablet hydrALAZINE (APRESOLINE) 10 MG tablet HYDROcodone-acetaminophen (NORCO/VICODIN) 5-325 MG tablet lidocaine (XYLOCAINE) 5 % ointment lisinopril (PRINIVIL,ZESTRIL) 40 MG tablet Multiple Vitamin (MULTIVITAMIN WITH MINERALS) TABS tablet polyethylene glycol powder (GLYCOLAX/MIRALAX) powder  Medications administered by patient during sleep study : No sleep medicine administered.  TECHNICIAN COMMENTS Comments added by technician: NO BATHROOM BREAKS TAKEN. EPR TO 3 FOR PATIENT COMFORT.  Comments added by scorer: N/A  RESPIRATORY PARAMETERS Optimal PAP Pressure (cm): 9 AHI at Optimal Pressure (/hr): 0.5 Overall Minimal O2 (%): 84.00 Supine % at Optimal Pressure (%): 51 Minimal O2 at Optimal Pressure (%): 94.0    SLEEP ARCHITECTURE The study was initiated at 10:51:35 PM  and ended at 4:57:42 AM. Sleep onset time was 5.2 minutes and the sleep efficiency was 91.6%. The total sleep time was 335.5 minutes. The patient spent 11.18% of the night in stage N1 sleep, 69.00% in stage N2 sleep, 0.00% in stage N3 and 19.82% in REM.Stage REM latency was 90.0 minutes Wake after sleep onset was 25.4. Alpha intrusion was absent. Supine sleep was 29.95%.  CARDIAC DATA The 2 lead EKG demonstrated sinus rhythm. The mean heart rate was 81.22 beats per minute. Other EKG findings include: None.  LEG MOVEMENT DATA The total Periodic Limb Movements of Sleep (PLMS) were 0. The PLMS index was 0.00. A PLMS index of <15 is considered normal in adults.  IMPRESSIONS - The patient was titrated to an optimal CPAP pressure of 9 cm of water.  - Central sleep apnea was not noted during this titration (CAI = 0.4/h). - Moderate oxygen desaturations were observed during this titration (min O2 = 84.00% at 5 cm water pressure). Oxygen nadir at 9 cm was 94%. - The patient snored with Soft snoring. - No cardiac abnormalities were observed during this study. - Clinically significant periodic limb movements were not noted during this study. Arousals associated with PLMs were rare.  DIAGNOSIS - Obstructive Sleep Apnea (327.23 [G47.33 ICD-10])  RECOMMENDATIONS - Recommend an initial trial of CPAP therapy at 9 cm H2O with heated humidification.  A Medium size Resmed Full Face Mask AirFit F20 mask was used for the titration study. - Avoid alcohol, sedatives and other CNS depressants that may worsen sleep apnea and disrupt normal sleep architecture. - Sleep hygiene should be reviewed to assess factors that may improve sleep quality. - Weight management and regular exercise should be initiated or continued. - Recommend a download be obtained in 30 days and sleep clinic evaluation.   [Electronically signed] 10/14/2015  08:58 PM  Nicki Guadalajarahomas Kelly MD, [facility] CenterFACC, ABSM Diplomate, American Board of Sleep  Medicine   NPI: [id number]  [facility] PH: [phone]   FX: [phone] ACCREDITED BY THE AMERICAN ACADEMY OF SLEEP MEDICINE

## 2015-11-19 ENCOUNTER — Telehealth: Payer: Self-pay | Admitting: Family Medicine

## 2015-11-19 NOTE — Telephone Encounter (Signed)
Pt calling requesting a new walker Pt states his is currently worn out due to the frequent walking he is doing with rehab Pt is requesting a walker with four wheels, breaks, and an area to sit down

## 2015-11-21 NOTE — Telephone Encounter (Signed)
Will route to PCP 

## 2015-11-22 NOTE — Telephone Encounter (Signed)
Rx for walker written and ready for pick up

## 2015-11-23 NOTE — Telephone Encounter (Signed)
Pt was called on 11/03 and informed of script for walker being ready for pick up.

## 2016-02-13 ENCOUNTER — Ambulatory Visit: Payer: Medicaid Other | Admitting: Nurse Practitioner

## 2016-03-20 ENCOUNTER — Encounter: Payer: Self-pay | Admitting: Family Medicine

## 2016-03-20 ENCOUNTER — Ambulatory Visit: Payer: Medicaid Other | Attending: Family Medicine | Admitting: Family Medicine

## 2016-03-20 VITALS — BP 178/111 | HR 80 | Temp 98.4°F | Ht 68.0 in | Wt 147.6 lb

## 2016-03-20 DIAGNOSIS — I10 Essential (primary) hypertension: Secondary | ICD-10-CM

## 2016-03-20 DIAGNOSIS — F1010 Alcohol abuse, uncomplicated: Secondary | ICD-10-CM | POA: Insufficient documentation

## 2016-03-20 DIAGNOSIS — I110 Hypertensive heart disease with heart failure: Secondary | ICD-10-CM | POA: Insufficient documentation

## 2016-03-20 DIAGNOSIS — Z8673 Personal history of transient ischemic attack (TIA), and cerebral infarction without residual deficits: Secondary | ICD-10-CM | POA: Diagnosis not present

## 2016-03-20 DIAGNOSIS — I5042 Chronic combined systolic (congestive) and diastolic (congestive) heart failure: Secondary | ICD-10-CM | POA: Diagnosis not present

## 2016-03-20 DIAGNOSIS — Z79899 Other long term (current) drug therapy: Secondary | ICD-10-CM | POA: Insufficient documentation

## 2016-03-20 DIAGNOSIS — M47816 Spondylosis without myelopathy or radiculopathy, lumbar region: Secondary | ICD-10-CM | POA: Diagnosis not present

## 2016-03-20 DIAGNOSIS — F17210 Nicotine dependence, cigarettes, uncomplicated: Secondary | ICD-10-CM | POA: Insufficient documentation

## 2016-03-20 DIAGNOSIS — Z7982 Long term (current) use of aspirin: Secondary | ICD-10-CM | POA: Diagnosis not present

## 2016-03-20 LAB — COMPLETE METABOLIC PANEL WITH GFR
ALBUMIN: 4.2 g/dL (ref 3.6–5.1)
ALK PHOS: 64 U/L (ref 40–115)
ALT: 43 U/L (ref 9–46)
AST: 64 U/L — AB (ref 10–35)
BUN: 10 mg/dL (ref 7–25)
CHLORIDE: 103 mmol/L (ref 98–110)
CO2: 27 mmol/L (ref 20–31)
Calcium: 9.5 mg/dL (ref 8.6–10.3)
Creat: 0.85 mg/dL (ref 0.70–1.33)
GFR, Est African American: >89 mL/min (ref >=60)
Glucose, Bld: 102 mg/dL — ABNORMAL HIGH (ref 65–99)
POTASSIUM: 4.6 mmol/L (ref 3.5–5.3)
Sodium: 139 mmol/L (ref 135–146)
Total Bilirubin: 1 mg/dL (ref 0.2–1.2)
Total Protein: 6.6 g/dL (ref 6.1–8.1)

## 2016-03-20 MED ORDER — LISINOPRIL 40 MG PO TABS: 40.0000 mg | ORAL_TABLET | Freq: Every day | ORAL | 5 refills | Status: DC

## 2016-03-20 MED ORDER — FUROSEMIDE 40 MG PO TABS: 40.0000 mg | ORAL_TABLET | Freq: Every day | ORAL | 11 refills | Status: DC

## 2016-03-20 MED ORDER — CARVEDILOL 6.25 MG PO TABS: 6.2500 mg | ORAL_TABLET | Freq: Two times a day (BID) | ORAL | 5 refills | Status: DC

## 2016-03-20 MED ORDER — CLONIDINE HCL 0.2 MG PO TABS
0.2000 mg | ORAL_TABLET | Freq: Once | ORAL | Status: AC
  Administered 2016-03-20: 0.2 mg via ORAL

## 2016-03-20 MED ORDER — ISOSORB DINITRATE-HYDRALAZINE 20-37.5 MG PO TABS: 1.0000 | ORAL_TABLET | Freq: Three times a day (TID) | ORAL | 3 refills | Status: DC

## 2016-03-20 MED ORDER — SPIRONOLACTONE 25 MG PO TABS: 25.0000 mg | ORAL_TABLET | Freq: Every day | ORAL | 11 refills | Status: DC

## 2016-03-20 NOTE — Progress Notes (Signed)
Subjective:  Patient ID: [name], male    DOB: [birth year]  Age: 51 y.o. MRN: [id number]  CC: Hypertension   HPI RAFORD BAZINET HTN, systolic CHF ( EF 15-20%) , stroke (02/2015 R MCA), depression, hx of ETOH abuse presents for    1. Accelerated HTN: he reports no blood pressure since 12/2015. He denies HA, CP, SOB, leg swelling. His speech is slurred, but not worsened. He drinks 1-2, 24 oz  beers per week.   2. Chronic low back pain: ranges from 3-9/10. Pain ranges. No exacerbating features. Pain is improved by standing up and stretching. He has tried robaxin without relief.  He is currently being treated at [facility]. He cannot recall the name of the medication, he thinks it might be soma. He reports it is not helping. He request a new referral to pain management.   Social History  Substance Use Topics  . Smoking status: Current Every Day Smoker    Packs/day: 0.10    Years: 20.00    Types: Cigarettes  . Smokeless tobacco: Never Used     Comment: ONLY SMOKES WHEN HE IS STRESS OUT  . Alcohol use 0.6 oz/week    1 Cans of beer per week     Comment: soccially     Outpatient Medications Prior to Visit  Medication Sig Dispense Refill  . aspirin 325 MG EC tablet Take 1 tablet (325 mg total) by mouth daily. 30 tablet 11  . carvedilol (COREG) 6.25 MG tablet Take 1 tablet (6.25 mg total) by mouth 2 (two) times daily with a meal. (Patient not taking: Reported on 03/20/2016) 60 tablet 5  . furosemide (LASIX) 40 MG tablet Take 1 tablet (40 mg total) by mouth daily. (Patient not taking: Reported on 03/20/2016) 30 tablet 11  . hydrALAZINE (APRESOLINE) 10 MG tablet Take 1 tablet (10 mg total) by mouth 3 (three) times daily. (Patient not taking: Reported on 03/20/2016) 90 tablet 3  . HYDROcodone-acetaminophen (NORCO/VICODIN) 5-325 MG tablet Take 1-2 tablets by mouth as needed for moderate pain.    Marland Kitchen lidocaine (XYLOCAINE) 5 % ointment Apply 1 application topically as needed. Apply to  low back (Patient not taking: Reported on 03/20/2016) 35.44 g 0  . lisinopril (PRINIVIL,ZESTRIL) 40 MG tablet Take 1 tablet (40 mg total) by mouth daily. (Patient not taking: Reported on 03/20/2016) 30 tablet 5  . Multiple Vitamin (MULTIVITAMIN WITH MINERALS) TABS tablet Take 1 tablet by mouth daily. (Patient not taking: Reported on 03/20/2016)    . polyethylene glycol powder (GLYCOLAX/MIRALAX) powder Take 17 g by mouth daily. (Patient not taking: Reported on 03/20/2016) 3350 g 1  . spironolactone (ALDACTONE) 25 MG tablet Take 1 tablet (25 mg total) by mouth daily. (Patient not taking: Reported on 03/20/2016) 30 tablet 11   No facility-administered medications prior to visit.     ROS Review of Systems  Constitutional: Negative for chills, fatigue, fever and unexpected weight change.  Eyes: Negative for visual disturbance.  Respiratory: Negative for cough and shortness of breath.   Cardiovascular: Negative for chest pain, palpitations and leg swelling.  Gastrointestinal: Negative for abdominal pain, blood in stool, constipation, diarrhea, nausea and vomiting.  Endocrine: Negative for polydipsia, polyphagia and polyuria.  Genitourinary: Negative for decreased urine volume, difficulty urinating, discharge, dysuria, enuresis, flank pain, frequency, genital sores, hematuria, penile pain, penile swelling, scrotal swelling, testicular pain and urgency.  Musculoskeletal: Positive for back pain. Negative for arthralgias, gait problem, myalgias and neck pain.  Skin: Negative for  rash.  Allergic/Immunologic: Negative for immunocompromised state.  Neurological: Positive for speech difficulty and weakness (in L leg ).  Hematological: Negative for adenopathy. Does not bruise/bleed easily.  Psychiatric/Behavioral: Negative for dysphoric mood, sleep disturbance and suicidal ideas. The patient is not nervous/anxious.     Objective:  BP (!) 178/111 (BP Location: Left Arm, Patient Position: Sitting, Cuff Size: Small)    Pulse 80   Temp 98.4 F (36.9 C) (Oral)   Ht 5\' 8"  (1.727 m)   Wt 147 lb 9.6 oz (67 kg)   SpO2 98%   BMI 22.44 kg/m   BP/Weight 03/20/2016 10/09/2015 10/07/2015  Systolic BP 178 183 -  Diastolic BP 111 127 -  Wt. (Lbs) 147.6 149.4 147  BMI 22.44 22.72 22.35   BP Readings from Last 3 Encounters:  03/20/16 (!) 178/111  10/09/15 (!) 183/127  08/13/15 (!) 154/97    Physical Exam  Constitutional: He appears well-developed and well-nourished. No distress.  HENT:  Head: Normocephalic and atraumatic.  Neck: Normal range of motion. Neck supple.  Cardiovascular: Normal rate, regular rhythm, normal heart sounds and intact distal pulses.   Pulmonary/Chest: Effort normal and breath sounds normal.  Abdominal: Soft. Bowel sounds are normal. He exhibits no distension and no mass. There is no tenderness. There is no rebound, no guarding and no CVA tenderness.  Musculoskeletal: He exhibits no edema.       Lumbar back: He exhibits decreased range of motion, pain and spasm. He exhibits no bony tenderness, no swelling, no edema, no deformity and no laceration.  Neurological: He is alert.  Weakness in L leg, using walker and AFO  Skin: Skin is warm and dry. No rash noted. No erythema.  Psychiatric: He has a normal mood and affect.    Depression screen [facility] 2/9 03/20/2016 10/09/2015 10/09/2015 07/12/2015 06/20/2015  Decreased Interest 0 0 0 1 0  Down, Depressed, Hopeless 0 0 0 1 0  PHQ - 2 Score 0 0 0 2 0  Altered sleeping 1 2 - 0 -  Tired, decreased energy 1 1 - 2 -  Change in appetite 0 3 - 3 -  Feeling bad or failure about yourself  0 0 - 1 -  Trouble concentrating 1 1 - 1 -  Moving slowly or fidgety/restless 0 0 - 0 -  Suicidal thoughts 0 0 - 0 -  PHQ-9 Score 3 7 - 9 -  Difficult doing work/chores - - - - -  Some recent data might be hidden   GAD 7 : Generalized Anxiety Score 03/20/2016 10/09/2015 07/12/2015 05/18/2015  Nervous, Anxious, on Edge 1 1 1 2   Control/stop worrying 1 1 1 2   Worry too  much - different things 0 0 1 1  Trouble relaxing 1 1 2 1   Restless 1 1 1 1   Easily annoyed or irritable 1 3 2 3   Afraid - awful might happen 0 0 1 1  Total GAD 7 Score 5 7 9 11    Treated with clonidine 0.2 mg PO x one   Repeat BP 160/106.    Assessment & Plan:   There are no diagnoses linked to this encounter. Omarius was seen today for hypertension.  Diagnoses and all orders for this visit:  Accelerated hypertension -     cloNIDine (CATAPRES) tablet 0.2 mg; Take 1 tablet (0.2 mg total) by mouth once. -     carvedilol (COREG) 6.25 MG tablet; Take 1 tablet (6.25 mg total) by mouth 2 (two) times daily with  a meal. -     lisinopril (PRINIVIL,ZESTRIL) 40 MG tablet; Take 1 tablet (40 mg total) by mouth daily. -     spironolactone (ALDACTONE) 25 MG tablet; Take 1 tablet (25 mg total) by mouth daily. -     furosemide (LASIX) 40 MG tablet; Take 1 tablet (40 mg total) by mouth daily. -     isosorbide-hydrALAZINE (BIDIL) 20-37.5 MG tablet; Take 1 tablet by mouth 3 (three) times daily. -     Ambulatory referral to Cardiology -     Cancel: BASIC METABOLIC PANEL WITH GFR -     COMPLETE METABOLIC PANEL WITH GFR  Chronic combined systolic and diastolic congestive heart failure (HCC) -     carvedilol (COREG) 6.25 MG tablet; Take 1 tablet (6.25 mg total) by mouth 2 (two) times daily with a meal. -     lisinopril (PRINIVIL,ZESTRIL) 40 MG tablet; Take 1 tablet (40 mg total) by mouth daily. -     spironolactone (ALDACTONE) 25 MG tablet; Take 1 tablet (25 mg total) by mouth daily. -     furosemide (LASIX) 40 MG tablet; Take 1 tablet (40 mg total) by mouth daily. -     isosorbide-hydrALAZINE (BIDIL) 20-37.5 MG tablet; Take 1 tablet by mouth 3 (three) times daily. -     ECHOCARDIOGRAM COMPLETE; Future -     Ambulatory referral to Cardiology -     COMPLETE METABOLIC PANEL WITH GFR  Osteoarthritis of lumbar spine, unspecified spinal osteoarthritis complication status -     Ambulatory referral to Pain  Clinic  ETOH abuse    No orders of the defined types were placed in this encounter.   Follow-up: Return in about 4 weeks (around 04/17/2016) for HTN .   [name] MD

## 2016-03-20 NOTE — Progress Notes (Signed)
Pt is here today to follow up on HTN. Pt states that he has not taken any medication since December.

## 2016-03-20 NOTE — Assessment & Plan Note (Signed)
A: HTN in setting of med non compliance. Patient has hx of CVA, HTN and alcohol induced cardiomyopathy P: Restart coreg 6.25 mg BID,  lisinopril 40 mg daily, lasix 40 mg daily, aldactone 25 mg daily. Change hydralazine 10 mg TID to bidil 20-37.5 mg TID  Repeat ECHO Cardiology referral

## 2016-03-20 NOTE — Assessment & Plan Note (Signed)
Reports persistent use but it is not excessive per reports Plan: Counseled patient about the importance of keeping alcohol intake low as it relates to his congestive heart failure.  CMP

## 2016-03-20 NOTE — Patient Instructions (Addendum)
[name] was seen today for hypertension.  Diagnoses and all orders for this visit:  Accelerated hypertension -     cloNIDine (CATAPRES) tablet 0.2 mg; Take 1 tablet (0.2 mg total) by mouth once. -     carvedilol (COREG) 6.25 MG tablet; Take 1 tablet (6.25 mg total) by mouth 2 (two) times daily with a meal. -     lisinopril (PRINIVIL,ZESTRIL) 40 MG tablet; Take 1 tablet (40 mg total) by mouth daily. -     spironolactone (ALDACTONE) 25 MG tablet; Take 1 tablet (25 mg total) by mouth daily. -     furosemide (LASIX) 40 MG tablet; Take 1 tablet (40 mg total) by mouth daily. -     isosorbide-hydrALAZINE (BIDIL) 20-37.5 MG tablet; Take 1 tablet by mouth 3 (three) times daily. -     Ambulatory referral to Cardiology -     BASIC METABOLIC PANEL WITH GFR  Chronic combined systolic and diastolic congestive heart failure (HCC) -     carvedilol (COREG) 6.25 MG tablet; Take 1 tablet (6.25 mg total) by mouth 2 (two) times daily with a meal. -     lisinopril (PRINIVIL,ZESTRIL) 40 MG tablet; Take 1 tablet (40 mg total) by mouth daily. -     spironolactone (ALDACTONE) 25 MG tablet; Take 1 tablet (25 mg total) by mouth daily. -     furosemide (LASIX) 40 MG tablet; Take 1 tablet (40 mg total) by mouth daily. -     isosorbide-hydrALAZINE (BIDIL) 20-37.5 MG tablet; Take 1 tablet by mouth 3 (three) times daily. -     ECHOCARDIOGRAM COMPLETE; Future -     Ambulatory referral to Cardiology  Osteoarthritis of lumbar spine, unspecified spinal osteoarthritis complication status -     Ambulatory referral to Pain Clinic   You will be called with ECHO and lab results  F/u in 2 weeks for BP check with clinical pharmacologist, Misty Stanley  F/u with me in 6 weeks for HTN   Dr. Armen Pickup

## 2016-03-20 NOTE — Assessment & Plan Note (Addendum)
A: HTN in setting of med non compliance. Patient has hx of CVA, HTN and alcohol induced cardiomyopathy P: Restart coreg 6.25 mg BID,  lisinopril 40 mg daily, lasix 40 mg daily, aldactone 25 mg daily. Change hydralazine 10 mg TID to bidil 20-37.5 mg TID  Repeat ECHO Cardiology referral   Close f/u for recheck, taper up on coreg to 12.5 mg BID if HR permits and patient is not endorsing increased fatigue at next OV, if fatigue or reduced HR taper up on imdur to 2 tabs TID as long as patient is not endorsing headache

## 2016-03-20 NOTE — Assessment & Plan Note (Signed)
New pain management referral placed.

## 2016-03-21 ENCOUNTER — Telehealth: Payer: Self-pay

## 2016-03-21 NOTE — Telephone Encounter (Signed)
Pt was called and informed of lab results and ECHO appointment date and time.

## 2016-03-21 NOTE — Telephone Encounter (Signed)
error 

## 2016-03-31 ENCOUNTER — Ambulatory Visit: Payer: Medicaid Other | Admitting: Nurse Practitioner

## 2016-04-02 ENCOUNTER — Encounter: Payer: Self-pay | Admitting: Nurse Practitioner

## 2016-04-03 ENCOUNTER — Encounter: Payer: Medicaid Other | Admitting: Pharmacist

## 2016-04-08 ENCOUNTER — Ambulatory Visit (HOSPITAL_COMMUNITY)
Admission: RE | Admit: 2016-04-08 | Discharge: 2016-04-08 | Disposition: A | Discharge status: Home / Self Care | Payer: Medicaid Other | Source: Ambulatory Visit | Attending: Family Medicine | Admitting: Family Medicine

## 2016-04-08 DIAGNOSIS — I5042 Chronic combined systolic (congestive) and diastolic (congestive) heart failure: Secondary | ICD-10-CM | POA: Diagnosis present

## 2016-04-08 DIAGNOSIS — I420 Dilated cardiomyopathy: Secondary | ICD-10-CM | POA: Diagnosis not present

## 2016-04-08 DIAGNOSIS — I340 Nonrheumatic mitral (valve) insufficiency: Secondary | ICD-10-CM | POA: Diagnosis not present

## 2016-04-08 DIAGNOSIS — I501 Left ventricular failure: Secondary | ICD-10-CM | POA: Diagnosis not present

## 2016-04-08 DIAGNOSIS — I361 Nonrheumatic tricuspid (valve) insufficiency: Secondary | ICD-10-CM | POA: Insufficient documentation

## 2016-04-08 NOTE — Progress Notes (Signed)
  Echocardiogram 2D Echocardiogram has been performed.  Leta Jungling M 04/08/2016, 12:03 PM

## 2016-04-09 NOTE — Progress Notes (Unsigned)
Cardiology Office Note   Date:  04/09/2016   ID:  [name], DOB 29-Aug-1965, MRN [id number]  PCP:  Lora Paula, MD  Cardiologist:   Chilton Si, MD   No chief complaint on file.     History of Present Illness: [name] is a 51 y.o. male with hypertension, prior CVA, chronic systolic and diastolic heart failure, non-compliance and polysubstance abuse who presents for follow up.  [name] had a stroke 02/24/15 in the setting of cocaine abuse and hypertension.  He has residual L sided weakness.  He followed up with his PCP, Dr. [name], who referred him to cardiology to establish care.  He was first seen on 04/17/15.  At that appointment lisinopril was increased to 40 mg and carvedilol was increased to 6.25mg .  He just started taking the higher dose this morning.  His prior cardiologist was Dr. [name] and he was last seen 11/2014.  At that time his blood pressure was poorly-controlled.  He required clonidine for management of his hypertension.    [name] reports feeling well.  He denies chest pain but does not shortness of breath when walking and when laying in bed at night.  He sleeps on one pillow.  [name] snores heavily and was referred for a sleep study 6-7 years ago but did not go to the appointment.  His sister fills his medication container daily and cooks a low-salt diet.  However, he admits to adding salt to his food.  He does not follow any particular low salt or low fluid diet.  He does not weigh himself daily.  He was participating in rehab after his stroke and hopes that he will be able to continue this.  He does not get any other formal exercise.  [name] reports that he has not used any drugs or alcohol since his stroke.  He carries a large bag of candy that he uses to avoid drug use.    f/u cocaine   Past Medical History:  Diagnosis Date  . CHF (congestive heart failure) (HCC)   . Headache   . Hypertension   . Stroke  [facility]) 02/24/2015    Past Surgical History:  Procedure Laterality Date  . NO PAST SURGERIES       Current Outpatient Prescriptions  Medication Sig Dispense Refill  . aspirin 325 MG EC tablet Take 1 tablet (325 mg total) by mouth daily. 30 tablet 11  . carvedilol (COREG) 6.25 MG tablet Take 1 tablet (6.25 mg total) by mouth 2 (two) times daily with a meal. 60 tablet 5  . furosemide (LASIX) 40 MG tablet Take 1 tablet (40 mg total) by mouth daily. 30 tablet 11  . HYDROcodone-acetaminophen (NORCO/VICODIN) 5-325 MG tablet Take 1-2 tablets by mouth as needed for moderate pain.    . isosorbide-hydrALAZINE (BIDIL) 20-37.5 MG tablet Take 1 tablet by mouth 3 (three) times daily. 90 tablet 3  . lisinopril (PRINIVIL,ZESTRIL) 40 MG tablet Take 1 tablet (40 mg total) by mouth daily. 30 tablet 5  . Multiple Vitamin (MULTIVITAMIN WITH MINERALS) TABS tablet Take 1 tablet by mouth daily. (Patient not taking: Reported on 03/20/2016)    . spironolactone (ALDACTONE) 25 MG tablet Take 1 tablet (25 mg total) by mouth daily. 30 tablet 11   No current facility-administered medications for this visit.     Allergies:   Penicillins    Social History:  The patient  reports that he has been smoking Cigarettes.  He has a 2.00 pack-year smoking history. He has never used smokeless tobacco. He reports that he drinks about 0.6 oz of alcohol per week . He reports that he uses drugs, including Cocaine.   Family History:  The patient's family history includes Hyperlipidemia in his mother; Hypertension in his brother, father, mother, and sister; Stroke in his maternal aunt.    ROS:  Please see the history of present illness.   Otherwise, review of systems are positive for none.   All other systems are reviewed and negative.    PHYSICAL EXAM: VS:  There were no vitals taken for this visit. , BMI There is no height or weight on file to calculate BMI. GENERAL:  Chronically ill-appearing.   HEENT:  Pupils equal round and  reactive, fundi not visualized, oral mucosa unremarkable NECK:  No jugular venous distention, waveform within normal limits, carotid upstroke brisk and symmetric, no bruits, no thyromegaly LYMPHATICS:  No cervical adenopathy LUNGS:  Clear to auscultation bilaterally HEART:  RRR.  PMI not displaced or sustained,S1 and S2 within normal limits, no S3, no S4, no clicks, no rubs, no murmurs ABD:  Flat, positive bowel sounds normal in frequency in pitch, no bruits, no rebound, no guarding, no midline pulsatile mass, no hepatomegaly, no splenomegaly EXT:  2 plus pulses throughout, no edema, no cyanosis no clubbing SKIN:  No rashes no nodules NEURO:  Cranial nerves II through XII grossly intact, motor grossly intact throughout.  Uncontrollable shaking of the R leg. PSYCH:  Cognitively intact, oriented to person place and time  EKG:  EKG is ordered today. The ekg ordered today demonstrates sinus rhythm rate 85 bpm.  LVH with repolarization abnormality.  Unchanged from prior.   Echo 04/09/16:   Study Conclusions  - Left ventricle: Global LV longitudinal strain is abnormal at   -10.9% The cavity size was severely dilated. There was mild focal   basal hypertrophy of the septum. Systolic function was severely   reduced. The estimated ejection fraction was 15-20%. Severe   diffuse hypokinesis with regional variations. There was an   increased relative contribution of atrial contraction to   ventricular filling. Doppler parameters are consistent with   abnormal left ventricular relaxation (grade 1 diastolic   dysfunction). - Mitral valve: There was trivial regurgitation. - Left atrium: The atrium was mildly dilated. - Tricuspid valve: There was trivial regurgitation. - Pulmonary arteries: Systolic pressure could not be accurately   estimated.   Echo 02/25/15: Study Conclusions  - Left ventricle: The cavity size was mildly dilated. There was  moderate concentric hypertrophy. Systolic function  was normal.  The estimated ejection fraction was in the range of 15% to 20%.  Severe diffuse hypokinesis. Although no diagnostic regional wall  motion abnormality was identified, this possibility cannot be  completely excluded on the basis of this study. Doppler  parameters are consistent with abnormal left ventricular  relaxation (grade 1 diastolic dysfunction). - Mitral valve: There was mild regurgitation. - Left atrium: The atrium was moderately dilated.   Recent Labs: 03/20/2016: ALT 43; BUN 10; Creat 0.85; Potassium 4.6; Sodium 139    Lipid Panel    Component Value Date/Time   CHOL 131 02/25/2015 0328   TRIG 134 02/25/2015 0328   HDL 83 02/25/2015 0328   CHOLHDL 1.6 02/25/2015 0328   VLDL 27 02/25/2015 0328   LDLCALC 21 02/25/2015 0328      Wt Readings from Last 3 Encounters:  03/20/16 67 kg (147 lb 9.6 oz)  10/09/15  67.8 kg (149 lb 6.4 oz)  10/07/15 66.7 kg (147 lb)      ASSESSMENT AND PLAN:  # Chronic systolic and diastolic heart failure:  [name] appears to be euvolemic at this time.  He just started the higher doses of carvedilol and lisinopril this morning. I have asked him and his mother to check his BP daily and bring the results to a follow up appointment with Phillips Hay in 2 weeks.   We discussed the importance of limiting his salt and fluid intake.  His family has been very helpful in limiting his salt intake but he adds salt to the family that they give him.  We also discussed the importance of weighing himself daily.  Continue carvedilol, lisinopril, and spironolactone. Given his history of cocaine abuse as recently as a few months ago he is not a candidate for an ICD at this time. If he can abstain for from illicit substances for a more substantial period of time we will need to consider ICD.   # Hypertensive heart disease: Blood pressure is poorly-controlled as above. We have asked him to keep a blood pressure log and to follow-up with our  pharmacist in 2 weeks.  We will not make any changes at this time as he just started a new regimen this morning.  He reports that he has not been using cocaine lately. For now, we will continue carvedilol. If he continues to use cocaine in the future we will need to switch this to an alternative drug class given the potential interaction with cocaine and beta blockers.  # Snoring: [name] endorses daytime somnolence and heavy snoring. We will refer him for sleep study.  Epworth sleepiness scale was 9.   # Polysubstance abuse: We discussed the importance of abstinence from illicit substances. He has been using candy to deter him from drinking alcohol or using drugs. We suggested that he try healthier snack options such as fruit and vegetables.   Current medicines are reviewed at length with the patient today.  The patient does not have concerns regarding medicines.  The following changes have been made:  no change  Labs/ tests ordered today include:   No orders of the defined types were placed in this encounter.    Disposition:   FU with Kamri Gotsch C. [name], MD, [facility] in 6 months.      This note was written with the assistance of speech recognition software.  Please excuse any transcriptional errors.  Signed, Ryver Zadrozny C. [name], MD, [facility]  04/09/2016 9:05 AM    [facility]

## 2016-04-10 ENCOUNTER — Encounter: Payer: Medicaid Other | Admitting: Pharmacist

## 2016-04-10 ENCOUNTER — Ambulatory Visit: Payer: Medicaid Other | Admitting: Cardiovascular Disease

## 2016-04-10 ENCOUNTER — Telehealth: Payer: Self-pay

## 2016-04-10 NOTE — Telephone Encounter (Signed)
Pt was called and informed of lab results. 

## 2016-05-09 ENCOUNTER — Encounter: Payer: Self-pay | Admitting: Cardiovascular Disease

## 2016-05-09 ENCOUNTER — Ambulatory Visit (INDEPENDENT_AMBULATORY_CARE_PROVIDER_SITE_OTHER): Payer: Medicaid Other | Admitting: Cardiovascular Disease

## 2016-05-09 VITALS — BP 160/100 | HR 68 | Ht 68.0 in | Wt 147.0 lb

## 2016-05-09 DIAGNOSIS — I5043 Acute on chronic combined systolic (congestive) and diastolic (congestive) heart failure: Secondary | ICD-10-CM | POA: Diagnosis not present

## 2016-05-09 DIAGNOSIS — R079 Chest pain, unspecified: Secondary | ICD-10-CM | POA: Diagnosis not present

## 2016-05-09 DIAGNOSIS — F1010 Alcohol abuse, uncomplicated: Secondary | ICD-10-CM | POA: Diagnosis not present

## 2016-05-09 DIAGNOSIS — G4733 Obstructive sleep apnea (adult) (pediatric): Secondary | ICD-10-CM | POA: Diagnosis not present

## 2016-05-09 DIAGNOSIS — I5020 Unspecified systolic (congestive) heart failure: Secondary | ICD-10-CM

## 2016-05-09 MED ORDER — SACUBITRIL-VALSARTAN 49-51 MG PO TABS: 1.0000 | ORAL_TABLET | Freq: Two times a day (BID) | ORAL | 5 refills | Status: DC

## 2016-05-09 NOTE — Patient Instructions (Addendum)
Medication Instructions:  STOP LISINOPRIL   START ENTRESTO 49-51 MG TWICE A DAY STARTING Sunday   Labwork: NONE  Testing/Procedures: Your physician has requested that you have a lexiscan myoview. For further information please visit [email or web address] Please follow instruction sheet, as given.  Follow-Up: Your physician recommends that you schedule a follow-up appointment in: 2-3 WEEKS   If you need a refill on your cardiac medications before your next appointment, please call your pharmacy.

## 2016-05-09 NOTE — Progress Notes (Signed)
Cardiology Office Note   Date:  05/11/2016   ID:  [name], DOB 1965-03-20, MRN [id number]  PCP:  Lora Paula, MD  Cardiologist:   Chilton Si, MD   Chief Complaint  Patient presents with  . Follow-up    occassional chest pain at bedtime, occassional shortness of breath, no edema, no pain or cramping in legs, no lightheaded or dizziness      History of Present Illness: [name] is a 51 y.o. male with hypertension, prior CVA, chronic systolic and diastolic heart failure, non-compliance and polysubstance abuse who presents for follow up.  [name] had a stroke 02/24/15 in the setting of cocaine abuse and hypertension.  He has residual L sided weakness.  He followed up with his PCP, Dr. Dessa Wells, who referred him to cardiology to establish care.  He was first seen on 04/17/15.  At that appointment lisinopril was increased to 40 mg and carvedilol was increased to 6.25mg . He continues to struggle with poorly-controlled hypertension.  He doesn't check his BP at home.  He saw Dr. Armen Wells on 03/20/16 and hydralazine was switched to bidil.  He has not yet taken his blood pressure medication today.   He denies lower extremity edema but endorses orthopnea and PND.  He doesn't weigh himself regularly and struggles with salt.  He has not used any drugs since his stroke but admits to drinking 6-12 beers per day.  He doesn't eat much food.  He endorses shortness of breath and chest pressure when laying down at night.  He describes it as a cramping sensation in his left chest that lasts approximately 10 minutes.  He notes that he gets tired easily with exertion.    Past Medical History:  Diagnosis Date  . CHF (congestive heart failure) (HCC)   . Headache   . Hypertension   . Stroke [facility]) 02/24/2015    Past Surgical History:  Procedure Laterality Date  . NO PAST SURGERIES       Current Outpatient Prescriptions  Medication Sig Dispense Refill  . aspirin 325 MG EC  tablet Take 1 tablet (325 mg total) by mouth daily. 30 tablet 11  . carvedilol (COREG) 6.25 MG tablet Take 1 tablet (6.25 mg total) by mouth 2 (two) times daily with a meal. 60 tablet 5  . furosemide (LASIX) 40 MG tablet Take 1 tablet (40 mg total) by mouth daily. 30 tablet 11  . isosorbide-hydrALAZINE (BIDIL) 20-37.5 MG tablet Take 1 tablet by mouth 3 (three) times daily. 90 tablet 3  . Multiple Vitamin (MULTIVITAMIN WITH MINERALS) TABS tablet Take 1 tablet by mouth daily.    Marland Kitchen spironolactone (ALDACTONE) 25 MG tablet Take 1 tablet (25 mg total) by mouth daily. 30 tablet 11  . sacubitril-valsartan (ENTRESTO) 49-51 MG Take 1 tablet by mouth 2 (two) times daily. 60 tablet 5   No current facility-administered medications for this visit.     Allergies:   Penicillins    Social History:  The patient  reports that he has been smoking Cigarettes.  He has a 2.00 pack-year smoking history. He has never used smokeless tobacco. He reports that he drinks about 0.6 oz of alcohol per week . He reports that he uses drugs, including Cocaine.   Family History:  The patient's family history includes Hyperlipidemia in his mother; Hypertension in his brother, father, mother, and sister; Stroke in his maternal aunt.    ROS:  Please see the history of present illness.  Otherwise, review of systems are positive for none.   All other systems are reviewed and negative.    PHYSICAL EXAM: VS:  BP (!) 160/100   Pulse 68   Ht 5\' 8"  (1.727 m)   Wt 66.7 kg (147 lb)   BMI 22.35 kg/m  , BMI Body mass index is 22.35 kg/m. GENERAL:  Well-appearing.  Thin. HEENT:  Pupils equal round and reactive, fundi not visualized, oral mucosa unremarkable NECK:  No jugular venous distention, waveform within normal limits, carotid upstroke brisk and symmetric, no bruits LYMPHATICS:  No cervical adenopathy LUNGS:  Clear to auscultation bilaterally HEART:  RRR.  PMI not displaced or sustained,S1 and S2 within normal limits, no S3,  no S4, no clicks, no rubs, no murmurs ABD:  Flat, positive bowel sounds normal in frequency in pitch, no bruits, no rebound, no guarding, no midline pulsatile mass, no hepatomegaly, no splenomegaly EXT:  2 plus pulses throughout, no edema, no cyanosis no clubbing SKIN:  No rashes no nodules NEURO:  Cranial nerves II through XII grossly intact, motor grossly intact throughout.  Uncontrollable shaking of the R leg. PSYCH:  Cognitively intact, oriented to person place and time  EKG:  EKG is ordered today. The ekg ordered 05/09/16 demonstrates sinus rhythm rate 68 bpm.  LVH with repolarization abnormality.  LAFB. Unchanged from prior.   Echo 04/09/16:   Study Conclusions  - Left ventricle: Global LV longitudinal strain is abnormal at   -10.9% The cavity size was severely dilated. There was mild focal   basal hypertrophy of the septum. Systolic function was severely   reduced. The estimated ejection fraction was 15-20%. Severe   diffuse hypokinesis with regional variations. There was an   increased relative contribution of atrial contraction to   ventricular filling. Doppler parameters are consistent with   abnormal left ventricular relaxation (grade 1 diastolic   dysfunction). - Mitral valve: There was trivial regurgitation. - Left atrium: The atrium was mildly dilated. - Tricuspid valve: There was trivial regurgitation. - Pulmonary arteries: Systolic pressure could not be accurately   estimated.   Echo 02/25/15: Study Conclusions  - Left ventricle: The cavity size was mildly dilated. There was  moderate concentric hypertrophy. Systolic function was normal.  The estimated ejection fraction was in the range of 15% to 20%.  Severe diffuse hypokinesis. Although no diagnostic regional wall  motion abnormality was identified, this possibility cannot be  completely excluded on the basis of this study. Doppler  parameters are consistent with abnormal left ventricular  relaxation  (grade 1 diastolic dysfunction). - Mitral valve: There was mild regurgitation. - Left atrium: The atrium was moderately dilated.   Recent Labs: 03/20/2016: ALT 43; BUN 10; Creat 0.85; Potassium 4.6; Sodium 139    Lipid Panel    Component Value Date/Time   CHOL 131 02/25/2015 0328   TRIG 134 02/25/2015 0328   HDL 83 02/25/2015 0328   CHOLHDL 1.6 02/25/2015 0328   VLDL 27 02/25/2015 0328   LDLCALC 21 02/25/2015 0328      Wt Readings from Last 3 Encounters:  05/09/16 66.7 kg (147 lb)  03/20/16 67 kg (147 lb 9.6 oz)  10/09/15 67.8 kg (149 lb 6.4 oz)      ASSESSMENT AND PLAN:  # Acute on chronic systolic and diastolic heart failure:  Mr. Dancel appears to be euvolemic at this time.  However he reports heart failure symptoms and chest pressure.  It doesn't appear that he has undergone an ischemia evaluation.  We  will get a Lexiscan Myoview to evaluate for ischemic cardiomyopathy.  We will also stop lisinopril and start him on Entresto 49/51mg  bid.  Continue carvedilol, lasix, Imdur and spironolactone. He reports continued abstinence from cocaine.  We will need to consider ICD.  Will await the results of his ischemia evaluation.   # Hypertensive heart disease: Blood pressure is poorly-controlled as above. He hasn't taken any blood pressure medication.  Continue carvedilol, Imdur and spironolactone.  Stopping lisinopril and starting Entresto in 2 days.   # OSA: Encouraged CPAP use.  # Polysubstance abuse: Mr. Visconti was congratulated on his abstinence from cocaine.  However, we discussed the importance of limiting alcohol intake.  He is currently drinking 6-12 beers daily.  Recommended abstinence but definitely no more than 1-2 per day.   Current medicines are reviewed at length with the patient today.  The patient does not have concerns regarding medicines.  The following changes have been made:  Stop lisinopril.  Start Entresto in 48 hours.   Labs/ tests ordered today include:    Orders Placed This Encounter  Procedures  . Myocardial Perfusion Imaging  . EKG 12-Lead     Disposition:   FU with Redell Nazir C. [name], MD, [facility] in 2-3 weeks.      This note was written with the assistance of speech recognition software.  Please excuse any transcriptional errors.  Signed, Vi Whitesel C. [name], MD, [facility]  05/11/2016 5:24 PM    [facility]

## 2016-05-12 ENCOUNTER — Ambulatory Visit: Payer: Medicaid Other | Admitting: Nurse Practitioner

## 2016-05-14 ENCOUNTER — Ambulatory Visit (INDEPENDENT_AMBULATORY_CARE_PROVIDER_SITE_OTHER): Payer: Medicaid Other | Admitting: Nurse Practitioner

## 2016-05-14 ENCOUNTER — Encounter: Payer: Self-pay | Admitting: Nurse Practitioner

## 2016-05-14 ENCOUNTER — Telehealth: Payer: Self-pay | Admitting: Cardiovascular Disease

## 2016-05-14 VITALS — BP 149/99 | HR 84 | Wt 150.8 lb

## 2016-05-14 DIAGNOSIS — F1010 Alcohol abuse, uncomplicated: Secondary | ICD-10-CM | POA: Diagnosis not present

## 2016-05-14 DIAGNOSIS — R269 Unspecified abnormalities of gait and mobility: Secondary | ICD-10-CM

## 2016-05-14 DIAGNOSIS — I63511 Cerebral infarction due to unspecified occlusion or stenosis of right middle cerebral artery: Secondary | ICD-10-CM | POA: Diagnosis not present

## 2016-05-14 DIAGNOSIS — I10 Essential (primary) hypertension: Secondary | ICD-10-CM | POA: Diagnosis not present

## 2016-05-14 NOTE — Patient Instructions (Addendum)
Continue aspirin for secondary stroke prevention Strict control of hypertension with blood pressure goal below 130/90 today's reading 149/99 LDL cholesterol goal below 70 Continue using walker for safe ambulation perform home exercise program Discharge from neurologic services REMEMBER Pneumonic FAST which stands for  F stands  for face drooping and weakness etc. A stands for arms, weakness S stands for speech slurred  T stands for time to call 911  Stroke Prevention Some health problems and behaviors may make it more likely for you to have a stroke. Below are ways to lessen your risk of having a stroke.  Be active for at least 30 minutes on most or all days.  Do not smoke. Try not to be around others who smoke.  Do not drink too much alcohol.  Do not have more than 2 drinks a day if you are a man.  Do not have more than 1 drink a day if you are a woman and are not pregnant.  Eat healthy foods, such as fruits and vegetables. If you were put on a specific diet, follow the diet as told.  Keep your cholesterol levels under control through diet and medicines. Look for foods that are low in saturated fat, trans fat, cholesterol, and are high in fiber.  If you have diabetes, follow all diet plans and take your medicine as told.  Ask your doctor if you need treatment to lower your blood pressure. If you have high blood pressure (hypertension), follow all diet plans and take your medicine as told by your doctor.  If you are 79-26 years old, have your blood pressure checked every 3-5 years. If you are age 72 or older, have your blood pressure checked every year.  Keep a healthy weight. Eat foods that are low in calories, salt, saturated fat, trans fat, and cholesterol.  Do not take drugs.  Avoid birth control pills, if this applies. Talk to your doctor about the risks of taking birth control pills.  Talk to your doctor if you have sleep problems (sleep apnea).  Take all medicine as  told by your doctor.  You may be told to take aspirin or blood thinner medicine. Take this medicine as told by your doctor.  Understand your medicine instructions.  Make sure any other conditions you have are being taken care of. Get help right away if:  You suddenly lose feeling (you feel numb) or have weakness in your face, arm, or leg.  Your face or eyelid hangs down to one side.  You suddenly feel confused.  You have trouble talking (aphasia) or understanding what people are saying.  You suddenly have trouble seeing in one or both eyes.  You suddenly have trouble walking.  You are dizzy.  You lose your balance or your movements are clumsy (uncoordinated).  You suddenly have a very bad headache and you do not know the cause.  You have new chest pain.  Your heart feels like it is fluttering or skipping a beat (irregular heartbeat). Do not wait to see if the symptoms above go away. Get help right away. Call your local emergency services (911 in [place] Do not drive yourself to the hospital. This information is not intended to replace advice given to you by your health care provider. Make sure you discuss any questions you have with your health care provider. Document Released: 07/08/2011 Document Revised: 06/14/2015 Document Reviewed: 07/09/2012 Elsevier Interactive Patient Education  2017 [employer].
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
## 2016-05-14 NOTE — Telephone Encounter (Signed)
New Message   Hunter with Cover my meds Calling regarding prior authorization for sent over for [facility]. Just wanted to make sure it was received.

## 2016-05-16 ENCOUNTER — Telehealth (HOSPITAL_COMMUNITY): Payer: Self-pay

## 2016-05-16 ENCOUNTER — Telehealth: Payer: Self-pay | Admitting: Cardiovascular Disease

## 2016-05-16 NOTE — Telephone Encounter (Signed)
New Message   Ref : [id number]  Did you receive the prior authorization for Entresto ?

## 2016-05-16 NOTE — Telephone Encounter (Signed)
We only precert for testing and procedures.  I am not sure which pool this goes to

## 2016-05-16 NOTE — Telephone Encounter (Signed)
Encounter complete. 

## 2016-05-17 NOTE — Progress Notes (Signed)
I agree with the above plan 

## 2016-05-21 ENCOUNTER — Ambulatory Visit (HOSPITAL_COMMUNITY)
Admission: RE | Admit: 2016-05-21 | Discharge: 2016-05-21 | Disposition: A | Discharge status: Home / Self Care | Payer: Medicaid Other | Source: Ambulatory Visit | Attending: Cardiology | Admitting: Cardiology

## 2016-05-21 DIAGNOSIS — I110 Hypertensive heart disease with heart failure: Secondary | ICD-10-CM | POA: Insufficient documentation

## 2016-05-21 DIAGNOSIS — R0602 Shortness of breath: Secondary | ICD-10-CM | POA: Diagnosis not present

## 2016-05-21 DIAGNOSIS — I428 Other cardiomyopathies: Secondary | ICD-10-CM | POA: Diagnosis not present

## 2016-05-21 DIAGNOSIS — R079 Chest pain, unspecified: Secondary | ICD-10-CM | POA: Diagnosis not present

## 2016-05-21 DIAGNOSIS — I5020 Unspecified systolic (congestive) heart failure: Secondary | ICD-10-CM | POA: Insufficient documentation

## 2016-05-21 DIAGNOSIS — F17210 Nicotine dependence, cigarettes, uncomplicated: Secondary | ICD-10-CM | POA: Diagnosis not present

## 2016-05-21 DIAGNOSIS — G4733 Obstructive sleep apnea (adult) (pediatric): Secondary | ICD-10-CM | POA: Diagnosis not present

## 2016-05-21 DIAGNOSIS — R9439 Abnormal result of other cardiovascular function study: Secondary | ICD-10-CM | POA: Diagnosis not present

## 2016-05-21 DIAGNOSIS — Z8673 Personal history of transient ischemic attack (TIA), and cerebral infarction without residual deficits: Secondary | ICD-10-CM | POA: Diagnosis not present

## 2016-05-21 LAB — MYOCARDIAL PERFUSION IMAGING
CHL CUP NUCLEAR SSS: 10
CHL CUP RESTING HR STRESS: 73 {beats}/min
LVDIAVOL: 294 mL (ref 62–150)
LVSYSVOL: 220 mL
NUC STRESS TID: 1.03
Peak HR: 121 {beats}/min
SDS: 6
SRS: 5

## 2016-05-21 MED ORDER — REGADENOSON 0.4 MG/5ML IV SOLN
0.4000 mg | Freq: Once | INTRAVENOUS | Status: AC
  Administered 2016-05-21: 0.4 mg via INTRAVENOUS

## 2016-05-21 MED ORDER — TECHNETIUM TC 99M TETROFOSMIN IV KIT
10.4000 | PACK | Freq: Once | INTRAVENOUS | Status: AC | PRN
  Administered 2016-05-21: 10.4 via INTRAVENOUS
  Filled 2016-05-21: qty 11

## 2016-05-21 MED ORDER — TECHNETIUM TC 99M TETROFOSMIN IV KIT
32.0000 | PACK | Freq: Once | INTRAVENOUS | Status: AC | PRN
  Administered 2016-05-21: 32 via INTRAVENOUS
  Filled 2016-05-21: qty 32

## 2016-05-23 NOTE — Telephone Encounter (Signed)
Called [place] [employer], PA# [id number], PA started and sent for review.

## 2016-05-29 NOTE — Telephone Encounter (Signed)
PA completed through cover my meds and waiting for review and approval

## 2016-06-04 ENCOUNTER — Encounter: Payer: Self-pay | Admitting: Family Medicine

## 2016-06-11 NOTE — Telephone Encounter (Signed)
Tried to call [phone] to follow up, currently closed 

## 2016-06-13 ENCOUNTER — Ambulatory Visit: Payer: Medicaid Other | Admitting: Cardiovascular Disease
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
## 2016-06-17 NOTE — Telephone Encounter (Signed)
Spoke with Gabriel Rung [id number] with [facility] approved through 05/18/17 Advised patient

## 2016-06-30 ENCOUNTER — Ambulatory Visit: Payer: Medicaid Other | Admitting: Family Medicine

## 2016-09-08 ENCOUNTER — Encounter: Payer: Self-pay | Admitting: Family Medicine

## 2016-09-08 ENCOUNTER — Ambulatory Visit: Payer: Medicaid Other | Attending: Family Medicine | Admitting: Family Medicine

## 2016-09-08 VITALS — BP 160/102 | HR 86 | Temp 98.2°F | Ht 68.0 in | Wt 148.8 lb

## 2016-09-08 DIAGNOSIS — I110 Hypertensive heart disease with heart failure: Secondary | ICD-10-CM | POA: Diagnosis not present

## 2016-09-08 DIAGNOSIS — R531 Weakness: Secondary | ICD-10-CM | POA: Diagnosis not present

## 2016-09-08 DIAGNOSIS — Z880 Allergy status to penicillin: Secondary | ICD-10-CM | POA: Diagnosis not present

## 2016-09-08 DIAGNOSIS — R29898 Other symptoms and signs involving the musculoskeletal system: Secondary | ICD-10-CM | POA: Diagnosis not present

## 2016-09-08 DIAGNOSIS — I69349 Monoplegia of lower limb following cerebral infarction affecting unspecified side: Secondary | ICD-10-CM | POA: Insufficient documentation

## 2016-09-08 DIAGNOSIS — Z79899 Other long term (current) drug therapy: Secondary | ICD-10-CM | POA: Diagnosis not present

## 2016-09-08 DIAGNOSIS — I10 Essential (primary) hypertension: Secondary | ICD-10-CM

## 2016-09-08 DIAGNOSIS — G4733 Obstructive sleep apnea (adult) (pediatric): Secondary | ICD-10-CM | POA: Insufficient documentation

## 2016-09-08 DIAGNOSIS — G4730 Sleep apnea, unspecified: Secondary | ICD-10-CM | POA: Diagnosis not present

## 2016-09-08 DIAGNOSIS — Z7982 Long term (current) use of aspirin: Secondary | ICD-10-CM | POA: Diagnosis not present

## 2016-09-08 DIAGNOSIS — I5042 Chronic combined systolic (congestive) and diastolic (congestive) heart failure: Secondary | ICD-10-CM | POA: Insufficient documentation

## 2016-09-08 MED ORDER — SACUBITRIL-VALSARTAN 49-51 MG PO TABS: 1.0000 | ORAL_TABLET | Freq: Two times a day (BID) | ORAL | 5 refills | Status: DC

## 2016-09-08 MED ORDER — CLONIDINE HCL 0.1 MG PO TABS
0.1000 mg | ORAL_TABLET | Freq: Once | ORAL | Status: AC
  Administered 2016-09-08: 0.1 mg via ORAL

## 2016-09-08 MED ORDER — CARVEDILOL 6.25 MG PO TABS: 6.2500 mg | ORAL_TABLET | Freq: Two times a day (BID) | ORAL | 5 refills | Status: DC

## 2016-09-08 MED ORDER — SPIRONOLACTONE 25 MG PO TABS: 25.0000 mg | ORAL_TABLET | Freq: Every day | ORAL | 5 refills | Status: DC

## 2016-09-08 MED ORDER — ISOSORB DINITRATE-HYDRALAZINE 20-37.5 MG PO TABS: 1.0000 | ORAL_TABLET | Freq: Three times a day (TID) | ORAL | 5 refills | Status: DC

## 2016-09-08 MED FILL — BIDIL TABLET: 20-37.5 | 30 days supply | Qty: 90 | Fill #0

## 2016-09-08 MED FILL — CARVEDILOL 6.25 MG TABLET: 6.25 | 30 days supply | Qty: 60 | Fill #0

## 2016-09-08 MED FILL — SPIRONOLACTONE 25 MG TABLET: 25 | 30 days supply | Qty: 30 | Fill #0

## 2016-09-08 MED FILL — ENTRESTO 49 MG-51 MG TABLET: 49-51 | 30 days supply | Qty: 60 | Fill #0
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
## 2016-09-08 NOTE — Progress Notes (Signed)
Pt has been out of BP meds for 1+ months.

## 2016-09-08 NOTE — Patient Instructions (Signed)

## 2016-09-09 ENCOUNTER — Telehealth: Payer: Self-pay | Admitting: Family Medicine

## 2016-09-09 NOTE — Telephone Encounter (Signed)
Cpap referral for patient was faxed to [facility] [phone]) which included rx, demographics, last office visit notes and sleep study notes.

## 2016-09-10 ENCOUNTER — Ambulatory Visit: Payer: Medicaid Other | Attending: Family Medicine

## 2016-09-10 ENCOUNTER — Telehealth: Payer: Self-pay | Admitting: Family Medicine

## 2016-09-10 DIAGNOSIS — G4733 Obstructive sleep apnea (adult) (pediatric): Secondary | ICD-10-CM

## 2016-09-10 DIAGNOSIS — I10 Essential (primary) hypertension: Secondary | ICD-10-CM

## 2016-09-10 NOTE — Telephone Encounter (Signed)
Patient needs a order placed for a new sleep study as per [facility] in order for patient to receive a CPAP machine. Medicaid will not consider a sleep study from a year ago.  Thank you.

## 2016-09-10 NOTE — Telephone Encounter (Signed)
Call placed to patient #[phone], regarding his CPAP. Spoke with patient and informed him that he will need to get a new sleep study done because [facility] informed us that  Medicaid will not accept his last sleep study, it's been over a year. Provider will place a new order. Informed patient that we will follow up with him once the order has been placed.

## 2016-09-10 NOTE — Telephone Encounter (Signed)
Received fax from [facility] this morning regarding patient's information that was sent yesterday. The document stated that "Medicaid will not accept a diagnostic sleep study that is over 51 year old, so the patient will have to have a new diagnostic sleep study done to re qualify the DX".  Sent a request for new order for patient's sleep study to PCP.

## 2016-09-11 ENCOUNTER — Telehealth: Payer: Self-pay

## 2016-09-11 LAB — COMPREHENSIVE METABOLIC PANEL
A/G RATIO: 2.3 — AB (ref 1.2–2.2)
ALK PHOS: 78 IU/L (ref 39–117)
ALT: 30 IU/L (ref 0–44)
AST: 41 IU/L — AB (ref 0–40)
Albumin: 5 g/dL (ref 3.5–5.5)
BUN/Creatinine Ratio: 7 — ABNORMAL LOW (ref 9–20)
BUN: 6 mg/dL (ref 6–24)
Bilirubin Total: 0.7 mg/dL (ref 0.0–1.2)
CALCIUM: 10.2 mg/dL (ref 8.7–10.2)
CHLORIDE: 100 mmol/L (ref 96–106)
CO2: 25 mmol/L (ref 20–29)
Creatinine, Ser: 0.86 mg/dL (ref 0.76–1.27)
GFR calc Af Amer: 116 mL/min/{1.73_m2} (ref >59)
GFR, EST NON AFRICAN AMERICAN: 100 mL/min/{1.73_m2} (ref >59)
Globulin, Total: 2.2 g/dL (ref 1.5–4.5)
Glucose: 94 mg/dL (ref 65–99)
POTASSIUM: 4.2 mmol/L (ref 3.5–5.2)
Sodium: 140 mmol/L (ref 134–144)
Total Protein: 7.2 g/dL (ref 6.0–8.5)

## 2016-09-11 LAB — LIPID PANEL
CHOL/HDL RATIO: 1.6 ratio (ref 0.0–5.0)
Cholesterol, Total: 158 mg/dL (ref 100–199)
HDL: 100 mg/dL (ref >39)
LDL CALC: 44 mg/dL (ref 0–99)
TRIGLYCERIDES: 69 mg/dL (ref 0–149)
VLDL CHOLESTEROL CAL: 14 mg/dL (ref 5–40)

## 2016-09-11 NOTE — Telephone Encounter (Signed)
Could you please run this by Erskine Squibb - I don't think patients need to have yearly sleep studies.

## 2016-09-11 NOTE — Telephone Encounter (Signed)
Pt was called and informed of lab results. 

## 2016-09-12 NOTE — Telephone Encounter (Signed)
Sleep study ordered
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
## 2016-09-24 ENCOUNTER — Encounter: Payer: Self-pay | Admitting: Cardiovascular Disease

## 2016-09-24 ENCOUNTER — Ambulatory Visit (INDEPENDENT_AMBULATORY_CARE_PROVIDER_SITE_OTHER): Payer: Medicaid Other | Admitting: Cardiovascular Disease

## 2016-09-24 VITALS — BP 208/116 | HR 88 | Ht 68.0 in | Wt 149.0 lb

## 2016-09-24 DIAGNOSIS — I160 Hypertensive urgency: Secondary | ICD-10-CM

## 2016-09-24 DIAGNOSIS — G4733 Obstructive sleep apnea (adult) (pediatric): Secondary | ICD-10-CM | POA: Diagnosis not present

## 2016-09-24 DIAGNOSIS — F1910 Other psychoactive substance abuse, uncomplicated: Secondary | ICD-10-CM

## 2016-09-24 DIAGNOSIS — I5042 Chronic combined systolic (congestive) and diastolic (congestive) heart failure: Secondary | ICD-10-CM | POA: Diagnosis not present

## 2016-09-24 DIAGNOSIS — I10 Essential (primary) hypertension: Secondary | ICD-10-CM

## 2016-09-24 MED ORDER — CARVEDILOL 25 MG PO TABS: 25.0000 mg | ORAL_TABLET | Freq: Two times a day (BID) | ORAL | 5 refills | Status: DC

## 2016-09-24 MED ORDER — SACUBITRIL-VALSARTAN 97-103 MG PO TABS: 1.0000 | ORAL_TABLET | Freq: Two times a day (BID) | ORAL | 5 refills | Status: DC

## 2016-09-24 MED ORDER — VARENICLINE TARTRATE 0.5 MG X 11 & 1 MG X 42 PO MISC: ORAL | 0 refills | Status: DC

## 2016-09-24 MED ORDER — CLONIDINE HCL 0.1 MG PO TABS
0.2000 mg | ORAL_TABLET | Freq: Once | ORAL | Status: AC
  Administered 2016-09-24: 0.2 mg via ORAL

## 2016-09-24 MED ORDER — VARENICLINE TARTRATE 1 MG PO TABS: 1.0000 mg | ORAL_TABLET | Freq: Two times a day (BID) | ORAL | 1 refills | Status: DC

## 2016-09-24 NOTE — Patient Instructions (Addendum)
Medication Instructions:  YOU HAVE BEEN GIVEN CLONIDINE 0.2 MG TODAY    INCREASE YOUR CARVEDILOL TO 25 MG TWICE A DAY   DOUBLE YOUR CURRENT ENTRESTO TO 2 TABLETS TWICE A DAY  WHEN YOU FINISH YOUR CURRENT BOTTLE YOU WILL START ENTRESTO 97/103 MG TWICE A DAY   START CHANTIX Take one 0.5 mg tablet by mouth once daily for 3 days, then increase to one 0.5 mg tablet twice daily for 4 days, then increase to one 1 mg tablet twice daily. AFTER YOU FINISH CHANTIX STARTER PACK YOU WILL CONTINUE 1 MG TWICE A DAY FOR 2 MONTHS   Labwork: NONE  Testing/Procedures: NONE  Follow-Up: Your physician recommends that you schedule a follow-up appointment in: 1 WEEK WITH PHARM D IN BLOOD PRESSURE CLINIC   Your physician recommends that you schedule a follow-up appointment in: 2 MONTH OV WITH DR    GET A BLOOD PRESSURE MACHINE AND MONITOR YOUR BLOOD PRESSURE AT HOME BRING MACHINE AND READINGS NEXT WEEK WHEN YOU RETURN   If you need a refill on your cardiac medications before your next appointment, please call your pharmacy.

## 2016-10-01 ENCOUNTER — Ambulatory Visit: Payer: Medicaid Other

## 2016-10-01 NOTE — Progress Notes (Deleted)
     10/01/2016 [name] [birth year] [id number]   HPI:  [name] is a 51 y.o. male patient of Dr [name], with a PMH below who presents today for hypertension clinic evaluation.  His medical history is significant for poorly controlled hypertension, prior CVA, chronic diastolic and systolic heart failure, non-compliance and poly-substance abuse.  His stroke was in February 2017 in the setting of cocaine abuse and hypertension.   He continues to have issues with compliance.    Blood Pressure Goal:  140/90     150/90   Current Medications:  Carvedilol 25 mg bid  Isosorbide/hydralazine 20/37.5 mg tid  Spironolactone 25 mg qd  Sacubitril/valsartan 97/103 bid  Furosemide 40 mg qd  Cardiac Hx:  Family Hx:  Social Hx:  Diet:  Exercise:  Home BP readings:  Intolerances:   CrCl cannot be calculated (Patient's most recent lab result is older than the maximum 21 days allowed.).  Wt Readings from Last 3 Encounters:  09/24/16 149 lb (67.6 kg)  09/08/16 148 lb 12.8 oz (67.5 kg)  05/21/16 147 lb (66.7 kg)   BP Readings from Last 3 Encounters:  09/24/16 (!) 208/116  09/08/16 (!) 160/102  05/14/16 (!) 149/99   Pulse Readings from Last 3 Encounters:  09/24/16 88  09/08/16 86  05/14/16 84    Current Outpatient Prescriptions  Medication Sig Dispense Refill  . aspirin 325 MG EC tablet Take 1 tablet (325 mg total) by mouth daily. 30 tablet 11  . carvedilol (COREG) 25 MG tablet Take 1 tablet (25 mg total) by mouth 2 (two) times daily with a meal. 60 tablet 5  . furosemide (LASIX) 40 MG tablet Take 1 tablet (40 mg total) by mouth daily. 30 tablet 11  . isosorbide-hydrALAZINE (BIDIL) 20-37.5 MG tablet Take 1 tablet by mouth 3 (three) times daily. 90 tablet 5  . Multiple Vitamin (MULTIVITAMIN WITH MINERALS) TABS tablet Take 1 tablet by mouth daily.    . sacubitril-valsartan (ENTRESTO) 97-103 MG Take 1 tablet by mouth 2 (two) times daily. 60 tablet 5  . spironolactone  (ALDACTONE) 25 MG tablet Take 1 tablet (25 mg total) by mouth daily. 30 tablet 5  . varenicline (CHANTIX CONTINUING MONTH PAK) 1 MG tablet Take 1 tablet (1 mg total) by mouth 2 (two) times daily. 60 tablet 1  . varenicline (CHANTIX STARTING MONTH PAK) 0.5 MG X 11 & 1 MG X 42 tablet Take one 0.5 mg tablet by mouth once daily for 3 days, then increase to one 0.5 mg tablet twice daily for 4 days, then increase to one 1 mg tablet twice daily. 53 tablet 0   No current facility-administered medications for this visit.     Allergies  Allergen Reactions  . Penicillins Other (See Comments)    Childhood     Past Medical History:  Diagnosis Date  . CHF (congestive heart failure) (HCC)   . Headache   . Hypertension   . Stroke (HCC) 02/24/2015    There were no vitals taken for this visit.  No problem-specific Assessment & Plan notes found for this encounter.   Phillips Hay PharmD CPP [facility]

## 2016-11-03 ENCOUNTER — Ambulatory Visit (HOSPITAL_BASED_OUTPATIENT_CLINIC_OR_DEPARTMENT_OTHER): Payer: Medicaid Other | Attending: Family Medicine | Admitting: Internal Medicine

## 2016-11-03 VITALS — Ht 68.0 in | Wt 149.0 lb

## 2016-11-03 DIAGNOSIS — R0683 Snoring: Secondary | ICD-10-CM | POA: Insufficient documentation

## 2016-11-03 DIAGNOSIS — I493 Ventricular premature depolarization: Secondary | ICD-10-CM | POA: Insufficient documentation

## 2016-11-03 DIAGNOSIS — G4733 Obstructive sleep apnea (adult) (pediatric): Secondary | ICD-10-CM | POA: Diagnosis not present

## 2016-11-09 DIAGNOSIS — G4733 Obstructive sleep apnea (adult) (pediatric): Secondary | ICD-10-CM

## 2016-11-09 NOTE — Procedures (Signed)
  Patient Name: [name], [name] Date: 11/03/2016 Gender: Male D.O.B: 1965/03/09 Age (years): 11 Referring Provider: [name] Height (inches): 68 Interpreting Physician: Jetty Duhamel MD, ABSM Weight (lbs): 149 RPSGT: Wylie Hail BMI: 23 MRN: [id number] Neck Size: 14.00 CLINICAL INFORMATION Sleep Study Type: NPSG  Indication for sleep study: OSA  Epworth Sleepiness Score: 7  Most recent polysomnogram dated 07/29/2015 revealed an AHI of 6.3/h and RDI of 6.6/h. Most recent titration study dated 10/07/2015 was optimal at 9cm H2O with an AHI of 1.8/h. SLEEP STUDY TECHNIQUE As per the AASM Manual for the Scoring of Sleep and Associated Events v2.3 (April 2016) with a hypopnea requiring 4% desaturations.  The channels recorded and monitored were frontal, central and occipital EEG, electrooculogram (EOG), submentalis EMG (chin), nasal and oral airflow, thoracic and abdominal wall motion, anterior tibialis EMG, snore microphone, electrocardiogram, and pulse oximetry.  MEDICATIONS Medications self-administered by patient taken the night of the study : N/A  SLEEP ARCHITECTURE The study was initiated at 10:54:41 PM and ended at 5:02:37 AM.  Sleep onset time was 36.1 minutes and the sleep efficiency was 87.9%. The total sleep time was 323.3 minutes.  Stage REM latency was 26.0 minutes.  The patient spent 13.25% of the night in stage N1 sleep, 63.71% in stage N2 sleep, 0.00% in stage N3 and 23.04% in REM.  Alpha intrusion was absent.  Supine sleep was 24.42%.  RESPIRATORY PARAMETERS The overall apnea/hypopnea index (AHI) was 3.0 per hour. There were 4 total apneas, including 1 obstructive, 3 central and 0 mixed apneas. There were 12 hypopneas and 16 RERAs.  The AHI during Stage REM sleep was 1.6 per hour.  AHI while supine was 4.6 per hour.  The mean oxygen saturation was 96.33%. The minimum SpO2 during sleep was 91.00%.  loud snoring was noted during this  study.  CARDIAC DATA The 2 lead EKG demonstrated sinus rhythm. The mean heart rate was 68.68 beats per minute. Other EKG findings include: PVCs.  LEG MOVEMENT DATA The total PLMS were 0 with a resulting PLMS index of 0.00. Associated arousal with leg movement index was 0.0 .  IMPRESSIONS - No significant obstructive sleep apnea occurred during this study (AHI = 3.0/h). - No significant central sleep apnea occurred during this study (CAI = 0.6/h). - The patient had minimal or no oxygen desaturation during the study (Min O2 = 91.00%) - The patient snored with loud snoring volume. - EKG findings include PVCs. - Clinically significant periodic limb movements did not occur during sleep. No significant associated arousals.  DIAGNOSIS - Primary Snoring (786.09 [R06.83 ICD-10])  RECOMMENDATIONS - Be careful with alcohol, sedatives and other CNS depressants that may worsen sleep apnea and disrupt normal sleep architecture. - Sleep hygiene should be reviewed to assess factors that may improve sleep quality. - Weight management and regular exercise should be initiated or continued if appropriate.  [Electronically signed] 11/09/2016 09:59 AM  Jetty Duhamel MD, ABSM Diplomate, American Board of Sleep Medicine   NPI: [id number]  Waymon Budge Diplomate, American Board of Sleep Medicine  ELECTRONICALLY SIGNED ON:  11/09/2016, 9:56 AM [facility] PH: [phone]   FX: [phone] ACCREDITED BY THE AMERICAN ACADEMY OF SLEEP MEDICINE

## 2016-11-10 ENCOUNTER — Telehealth: Payer: Self-pay

## 2016-11-10 NOTE — Telephone Encounter (Signed)
Pt was called and informed of sleep study results. 
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
## 2016-11-25 ENCOUNTER — Ambulatory Visit: Payer: Medicaid Other | Admitting: Cardiovascular Disease

## 2016-11-26 ENCOUNTER — Encounter: Payer: Self-pay | Admitting: *Deleted

## 2016-12-09 ENCOUNTER — Ambulatory Visit: Payer: Medicaid Other | Admitting: Family Medicine

## 2016-12-16 ENCOUNTER — Emergency Department (HOSPITAL_COMMUNITY)
Admission: EM | Admit: 2016-12-16 | Discharge: 2016-12-16 | Disposition: A | Discharge status: Home / Self Care | Payer: Medicaid Other | Attending: Emergency Medicine | Admitting: Emergency Medicine

## 2016-12-16 ENCOUNTER — Emergency Department (HOSPITAL_COMMUNITY): Payer: Medicaid Other

## 2016-12-16 ENCOUNTER — Encounter (HOSPITAL_COMMUNITY): Payer: Self-pay | Admitting: Emergency Medicine

## 2016-12-16 DIAGNOSIS — R072 Precordial pain: Secondary | ICD-10-CM | POA: Diagnosis not present

## 2016-12-16 DIAGNOSIS — I160 Hypertensive urgency: Secondary | ICD-10-CM | POA: Insufficient documentation

## 2016-12-16 DIAGNOSIS — I10 Essential (primary) hypertension: Secondary | ICD-10-CM | POA: Diagnosis not present

## 2016-12-16 DIAGNOSIS — J810 Acute pulmonary edema: Secondary | ICD-10-CM | POA: Diagnosis not present

## 2016-12-16 DIAGNOSIS — I509 Heart failure, unspecified: Secondary | ICD-10-CM | POA: Insufficient documentation

## 2016-12-16 DIAGNOSIS — F17210 Nicotine dependence, cigarettes, uncomplicated: Secondary | ICD-10-CM | POA: Insufficient documentation

## 2016-12-16 DIAGNOSIS — R0602 Shortness of breath: Secondary | ICD-10-CM

## 2016-12-16 DIAGNOSIS — Z79899 Other long term (current) drug therapy: Secondary | ICD-10-CM | POA: Diagnosis not present

## 2016-12-16 LAB — CBC WITH DIFFERENTIAL/PLATELET
Basophils Absolute: 0 10*3/uL (ref 0.0–0.1)
Basophils Relative: 0 %
EOS ABS: 0.1 10*3/uL (ref 0.0–0.7)
Eosinophils Relative: 1 %
HCT: 32.7 % — ABNORMAL LOW (ref 39.0–52.0)
HEMOGLOBIN: 11.4 g/dL — AB (ref 13.0–17.0)
LYMPHS ABS: 0.8 10*3/uL (ref 0.7–4.0)
LYMPHS PCT: 13 %
MCH: 34.9 pg — AB (ref 26.0–34.0)
MCHC: 34.9 g/dL (ref 30.0–36.0)
MCV: 100 fL (ref 78.0–100.0)
Monocytes Absolute: 0.4 10*3/uL (ref 0.1–1.0)
Monocytes Relative: 6 %
NEUTROS PCT: 80 %
Neutro Abs: 4.8 10*3/uL (ref 1.7–7.7)
Platelets: 149 10*3/uL — ABNORMAL LOW (ref 150–400)
RBC: 3.27 MIL/uL — AB (ref 4.22–5.81)
RDW: 12.9 % (ref 11.5–15.5)
WBC: 6.1 10*3/uL (ref 4.0–10.5)

## 2016-12-16 LAB — URINALYSIS, ROUTINE W REFLEX MICROSCOPIC
Bacteria, UA: NONE SEEN
Bilirubin Urine: NEGATIVE
GLUCOSE, UA: NEGATIVE mg/dL
HGB URINE DIPSTICK: NEGATIVE
KETONES UR: NEGATIVE mg/dL
LEUKOCYTES UA: NEGATIVE
NITRITE: NEGATIVE
PROTEIN: 100 mg/dL — AB
Specific Gravity, Urine: 1.02 (ref 1.005–1.030)
pH: 5 (ref 5.0–8.0)

## 2016-12-16 LAB — COMPREHENSIVE METABOLIC PANEL
ALK PHOS: 61 U/L (ref 38–126)
ALT: 48 U/L (ref 17–63)
AST: 58 U/L — ABNORMAL HIGH (ref 15–41)
Albumin: 3.4 g/dL — ABNORMAL LOW (ref 3.5–5.0)
Anion gap: 5 (ref 5–15)
BUN: 12 mg/dL (ref 6–20)
CALCIUM: 8.3 mg/dL — AB (ref 8.9–10.3)
CO2: 21 mmol/L — AB (ref 22–32)
CREATININE: 0.74 mg/dL (ref 0.61–1.24)
Chloride: 114 mmol/L — ABNORMAL HIGH (ref 101–111)
GFR calc non Af Amer: >60 mL/min (ref >60)
GLUCOSE: 115 mg/dL — AB (ref 65–99)
Potassium: 3.2 mmol/L — ABNORMAL LOW (ref 3.5–5.1)
Sodium: 140 mmol/L (ref 135–145)
Total Bilirubin: 0.4 mg/dL (ref 0.3–1.2)
Total Protein: 5.9 g/dL — ABNORMAL LOW (ref 6.5–8.1)

## 2016-12-16 LAB — I-STAT TROPONIN, ED
Troponin i, poc: 0.02 ng/mL (ref 0.00–0.08)
Troponin i, poc: 0.03 ng/mL (ref 0.00–0.08)

## 2016-12-16 LAB — D-DIMER, QUANTITATIVE: D-Dimer, Quant: 0.3 ug/mL-FEU (ref 0.00–0.50)

## 2016-12-16 LAB — BRAIN NATRIURETIC PEPTIDE: B Natriuretic Peptide: 1002.7 pg/mL — ABNORMAL HIGH (ref 0.0–100.0)

## 2016-12-16 LAB — PROTIME-INR
INR: 1.02
Prothrombin Time: 13.3 seconds (ref 11.4–15.2)

## 2016-12-16 MED ORDER — ASPIRIN 81 MG PO CHEW
324.0000 mg | CHEWABLE_TABLET | Freq: Once | ORAL | Status: AC
  Administered 2016-12-16: 324 mg via ORAL
  Filled 2016-12-16: qty 4

## 2016-12-16 MED ORDER — FUROSEMIDE 10 MG/ML IJ SOLN
20.0000 mg | Freq: Once | INTRAMUSCULAR | Status: AC
  Administered 2016-12-16: 10 mg via INTRAVENOUS
  Filled 2016-12-16: qty 4

## 2016-12-16 MED ORDER — LABETALOL HCL 5 MG/ML IV SOLN
10.0000 mg | Freq: Once | INTRAVENOUS | Status: AC
Start: 2016-12-16 — End: 2016-12-16
  Administered 2016-12-16: 10 mg via INTRAVENOUS
  Filled 2016-12-16: qty 4

## 2016-12-16 MED ORDER — NITROGLYCERIN 0.4 MG SL SUBL: 0.4000 mg | SUBLINGUAL_TABLET | SUBLINGUAL | Status: DC | PRN

## 2016-12-16 MED ORDER — CARVEDILOL 6.25 MG PO TABS: 6.2500 mg | ORAL_TABLET | Freq: Two times a day (BID) | ORAL | 0 refills | Status: DC

## 2016-12-16 MED ORDER — FUROSEMIDE 40 MG PO TABS: 40.0000 mg | ORAL_TABLET | Freq: Every day | ORAL | 0 refills | Status: DC

## 2016-12-16 NOTE — ED Notes (Signed)
Bed: SV77 Expected date:  Expected time:  Means of arrival:  Comments: EMS 51 yo male from home SOB/cough x 1 week BP 246/140 MP ST 120

## 2016-12-16 NOTE — Discharge Instructions (Signed)
Your workup today showed evidence of fluid overload.  I suspect this was due to your elevated blood pressure being off of your blood pressure medicines.  Please take the blood pressure medicine and diuretic to help with her fluid overload and blood pressure.  Please go to your appointment tomorrow with your primary care physician.  If any symptoms change or worsen, please return immediately to the nearest emergency department as she may need admission.  We discussed the possibility of admission today and you did not want to pursue this.  If you develop any new chest pain, please return.
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
## 2016-12-16 NOTE — ED Notes (Signed)
Pt ambulated over 50 feet  RR 35 and O2 was at 88%.  Pt's vitals were O2's  95 and RR at 20 when back in bed. Pt did not complain of distress.

## 2016-12-16 NOTE — ED Triage Notes (Signed)
Per EMS , pt. From home with complaint of SOB with productive cough x 1 week. Pt. Is hypertensive with BP 226/157mmhg taken manually by EMS, pt. Stated that he ran out of BP med x 2 days. Denied chest pain , no N/V , denied fever.

## 2016-12-17 ENCOUNTER — Ambulatory Visit: Payer: Medicaid Other | Admitting: Family Medicine

## 2016-12-17 LAB — URINE CULTURE: Culture: NO GROWTH

## 2016-12-22 ENCOUNTER — Emergency Department (HOSPITAL_COMMUNITY): Payer: Medicaid Other

## 2016-12-22 ENCOUNTER — Observation Stay (HOSPITAL_COMMUNITY)
Admission: EM | Admit: 2016-12-22 | Discharge: 2016-12-23 | Disposition: A | Discharge status: Home / Self Care | Payer: Medicaid Other | Attending: Internal Medicine | Admitting: Internal Medicine

## 2016-12-22 ENCOUNTER — Other Ambulatory Visit: Payer: Self-pay

## 2016-12-22 ENCOUNTER — Encounter (HOSPITAL_COMMUNITY): Payer: Self-pay | Admitting: Emergency Medicine

## 2016-12-22 DIAGNOSIS — I426 Alcoholic cardiomyopathy: Secondary | ICD-10-CM | POA: Diagnosis not present

## 2016-12-22 DIAGNOSIS — I161 Hypertensive emergency: Secondary | ICD-10-CM

## 2016-12-22 DIAGNOSIS — J9601 Acute respiratory failure with hypoxia: Secondary | ICD-10-CM | POA: Insufficient documentation

## 2016-12-22 DIAGNOSIS — R0603 Acute respiratory distress: Secondary | ICD-10-CM

## 2016-12-22 DIAGNOSIS — I509 Heart failure, unspecified: Principal | ICD-10-CM

## 2016-12-22 DIAGNOSIS — F329 Major depressive disorder, single episode, unspecified: Secondary | ICD-10-CM | POA: Diagnosis not present

## 2016-12-22 DIAGNOSIS — F1910 Other psychoactive substance abuse, uncomplicated: Secondary | ICD-10-CM | POA: Insufficient documentation

## 2016-12-22 DIAGNOSIS — R748 Abnormal levels of other serum enzymes: Secondary | ICD-10-CM | POA: Diagnosis present

## 2016-12-22 DIAGNOSIS — Z79899 Other long term (current) drug therapy: Secondary | ICD-10-CM | POA: Insufficient documentation

## 2016-12-22 DIAGNOSIS — R778 Other specified abnormalities of plasma proteins: Secondary | ICD-10-CM

## 2016-12-22 DIAGNOSIS — F1410 Cocaine abuse, uncomplicated: Secondary | ICD-10-CM | POA: Diagnosis not present

## 2016-12-22 DIAGNOSIS — R7989 Other specified abnormal findings of blood chemistry: Secondary | ICD-10-CM

## 2016-12-22 DIAGNOSIS — I5042 Chronic combined systolic (congestive) and diastolic (congestive) heart failure: Secondary | ICD-10-CM

## 2016-12-22 DIAGNOSIS — I10 Essential (primary) hypertension: Secondary | ICD-10-CM | POA: Diagnosis not present

## 2016-12-22 HISTORY — DX: Heart failure, unspecified: I50.9

## 2016-12-22 LAB — COMPREHENSIVE METABOLIC PANEL
ALK PHOS: 73 U/L (ref 38–126)
ALT: 23 U/L (ref 17–63)
AST: 30 U/L (ref 15–41)
Albumin: 4.2 g/dL (ref 3.5–5.0)
Anion gap: 11 (ref 5–15)
BILIRUBIN TOTAL: 0.7 mg/dL (ref 0.3–1.2)
BUN: 11 mg/dL (ref 6–20)
CALCIUM: 9.9 mg/dL (ref 8.9–10.3)
CO2: 23 mmol/L (ref 22–32)
CREATININE: 0.7 mg/dL (ref 0.61–1.24)
Chloride: 105 mmol/L (ref 101–111)
GFR calc Af Amer: >60 mL/min (ref >60)
Glucose, Bld: 99 mg/dL (ref 65–99)
POTASSIUM: 3.9 mmol/L (ref 3.5–5.1)
Sodium: 139 mmol/L (ref 135–145)
TOTAL PROTEIN: 7.1 g/dL (ref 6.5–8.1)

## 2016-12-22 LAB — CBC WITH DIFFERENTIAL/PLATELET
Basophils Absolute: 0 10*3/uL (ref 0.0–0.1)
Basophils Relative: 1 %
EOS ABS: 0.1 10*3/uL (ref 0.0–0.7)
EOS PCT: 1 %
HCT: 40.9 % (ref 39.0–52.0)
HEMOGLOBIN: 14.3 g/dL (ref 13.0–17.0)
LYMPHS ABS: 0.9 10*3/uL (ref 0.7–4.0)
Lymphocytes Relative: 14 %
MCH: 34.6 pg — AB (ref 26.0–34.0)
MCHC: 35 g/dL (ref 30.0–36.0)
MCV: 99 fL (ref 78.0–100.0)
MONOS PCT: 6 %
Monocytes Absolute: 0.4 10*3/uL (ref 0.1–1.0)
NEUTROS PCT: 78 %
Neutro Abs: 5.1 10*3/uL (ref 1.7–7.7)
Platelets: 218 10*3/uL (ref 150–400)
RBC: 4.13 MIL/uL — ABNORMAL LOW (ref 4.22–5.81)
RDW: 13 % (ref 11.5–15.5)
WBC: 6.5 10*3/uL (ref 4.0–10.5)

## 2016-12-22 LAB — I-STAT CG4 LACTIC ACID, ED: LACTIC ACID, VENOUS: 1.14 mmol/L (ref 0.5–1.9)

## 2016-12-22 LAB — RAPID URINE DRUG SCREEN, HOSP PERFORMED
Amphetamines: NOT DETECTED
BARBITURATES: NOT DETECTED
Benzodiazepines: NOT DETECTED
Cocaine: POSITIVE — AB
OPIATES: NOT DETECTED
TETRAHYDROCANNABINOL: POSITIVE — AB

## 2016-12-22 LAB — I-STAT TROPONIN, ED: Troponin i, poc: 0.02 ng/mL (ref 0.00–0.08)

## 2016-12-22 LAB — BRAIN NATRIURETIC PEPTIDE: B Natriuretic Peptide: 1096.9 pg/mL — ABNORMAL HIGH (ref 0.0–100.0)

## 2016-12-22 LAB — TROPONIN I
TROPONIN I: 0.04 ng/mL — AB (ref <0.03)
TROPONIN I: 0.06 ng/mL — AB (ref <0.03)
Troponin I: 0.05 ng/mL (ref <0.03)

## 2016-12-22 LAB — ETHANOL

## 2016-12-22 MED ORDER — SODIUM CHLORIDE 0.9% FLUSH: 3.0000 mL | INTRAVENOUS | Status: DC | PRN

## 2016-12-22 MED ORDER — ISOSORB DINITRATE-HYDRALAZINE 20-37.5 MG PO TABS
1.0000 | ORAL_TABLET | Freq: Three times a day (TID) | ORAL | Status: DC
  Administered 2016-12-22 – 2016-12-23 (×4): 1 via ORAL
  Filled 2016-12-22 (×5): qty 1

## 2016-12-22 MED ORDER — ONDANSETRON HCL 4 MG/2ML IJ SOLN: 4.0000 mg | Freq: Four times a day (QID) | INTRAMUSCULAR | Status: DC | PRN

## 2016-12-22 MED ORDER — LIDOCAINE 5 % EX PTCH
1.0000 | MEDICATED_PATCH | CUTANEOUS | Status: DC
  Administered 2016-12-22: 1 via TRANSDERMAL
  Filled 2016-12-22: qty 1

## 2016-12-22 MED ORDER — SODIUM CHLORIDE 0.9 % IV SOLN: 250.0000 mL | INTRAVENOUS | Status: DC | PRN

## 2016-12-22 MED ORDER — CARVEDILOL 6.25 MG PO TABS
6.2500 mg | ORAL_TABLET | Freq: Two times a day (BID) | ORAL | Status: DC
Start: 2016-12-22 — End: 2016-12-23
  Administered 2016-12-22 – 2016-12-23 (×3): 6.25 mg via ORAL
  Filled 2016-12-22 (×3): qty 1

## 2016-12-22 MED ORDER — SACUBITRIL-VALSARTAN 49-51 MG PO TABS
1.0000 | ORAL_TABLET | Freq: Two times a day (BID) | ORAL | Status: DC
  Administered 2016-12-22 – 2016-12-23 (×2): 1 via ORAL
  Filled 2016-12-22 (×2): qty 1

## 2016-12-22 MED ORDER — FENTANYL CITRATE (PF) 100 MCG/2ML IJ SOLN
50.0000 ug | Freq: Once | INTRAMUSCULAR | Status: AC
  Administered 2016-12-22: 50 ug via INTRAVENOUS
  Filled 2016-12-22: qty 2

## 2016-12-22 MED ORDER — FUROSEMIDE 10 MG/ML IJ SOLN
40.0000 mg | Freq: Once | INTRAMUSCULAR | Status: AC
  Administered 2016-12-22: 40 mg via INTRAVENOUS
  Filled 2016-12-22: qty 4

## 2016-12-22 MED ORDER — FUROSEMIDE 10 MG/ML IJ SOLN
40.0000 mg | Freq: Two times a day (BID) | INTRAMUSCULAR | Status: DC
  Administered 2016-12-22 – 2016-12-23 (×3): 40 mg via INTRAVENOUS
  Filled 2016-12-22 (×3): qty 4

## 2016-12-22 MED ORDER — ASPIRIN EC 325 MG PO TBEC: 325.0000 mg | DELAYED_RELEASE_TABLET | Freq: Every day | ORAL | Status: DC

## 2016-12-22 MED ORDER — LABETALOL HCL 5 MG/ML IV SOLN
20.0000 mg | Freq: Once | INTRAVENOUS | Status: AC
Start: 2016-12-22 — End: 2016-12-22
  Administered 2016-12-22: 20 mg via INTRAVENOUS
  Filled 2016-12-22: qty 4

## 2016-12-22 MED ORDER — ASPIRIN EC 325 MG PO TBEC
325.0000 mg | DELAYED_RELEASE_TABLET | Freq: Every day | ORAL | Status: DC
  Administered 2016-12-22 – 2016-12-23 (×2): 325 mg via ORAL
  Filled 2016-12-22 (×2): qty 1

## 2016-12-22 MED ORDER — CYCLOBENZAPRINE HCL 5 MG PO TABS
5.0000 mg | ORAL_TABLET | Freq: Once | ORAL | Status: AC
  Administered 2016-12-22: 5 mg via ORAL
  Filled 2016-12-22: qty 1

## 2016-12-22 MED ORDER — ALBUTEROL SULFATE (2.5 MG/3ML) 0.083% IN NEBU: 5.0000 mg | INHALATION_SOLUTION | Freq: Once | RESPIRATORY_TRACT | Status: DC

## 2016-12-22 MED ORDER — ACETAMINOPHEN 325 MG PO TABS
650.0000 mg | ORAL_TABLET | ORAL | Status: DC | PRN
  Administered 2016-12-22 – 2016-12-23 (×2): 650 mg via ORAL
  Filled 2016-12-22 (×2): qty 2

## 2016-12-22 MED ORDER — SODIUM CHLORIDE 0.9% FLUSH
3.0000 mL | Freq: Two times a day (BID) | INTRAVENOUS | Status: DC
  Administered 2016-12-22 – 2016-12-23 (×3): 3 mL via INTRAVENOUS

## 2016-12-22 MED ORDER — SPIRONOLACTONE 25 MG PO TABS
25.0000 mg | ORAL_TABLET | Freq: Every day | ORAL | Status: DC
  Administered 2016-12-22 – 2016-12-23 (×2): 25 mg via ORAL
  Filled 2016-12-22 (×2): qty 1

## 2016-12-22 MED ORDER — HYDROCODONE-ACETAMINOPHEN 5-325 MG PO TABS
1.0000 | ORAL_TABLET | Freq: Four times a day (QID) | ORAL | Status: DC | PRN
Start: 2016-12-22 — End: 2016-12-23
  Administered 2016-12-22: 1 via ORAL
  Filled 2016-12-22: qty 1

## 2016-12-22 MED ORDER — ADULT MULTIVITAMIN W/MINERALS CH
1.0000 | ORAL_TABLET | Freq: Every day | ORAL | Status: DC
  Administered 2016-12-22 – 2016-12-23 (×2): 1 via ORAL
  Filled 2016-12-22 (×2): qty 1

## 2016-12-22 NOTE — ED Notes (Signed)
ADMISSION Provider at bedside. 
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
## 2016-12-22 NOTE — ED Triage Notes (Signed)
Pt coming from home with complaints of resp distress. Pt was seen recently for fluid in lower lobes and hypertentsion. Pt arriving for same today. Pt states he has not had his BP meds since before 12/16/16. Pt A&O x4.  Per EMS pt had O2 sats of 80% on room air. Pt placed on 3L via nasal canula.

## 2016-12-22 NOTE — ED Notes (Signed)
Bed: OH72 Expected date:  Expected time:  Means of arrival:  Comments: Hold for 25
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
## 2016-12-22 NOTE — ED Notes (Addendum)
Date and time results received: 12/22/16   (use smartphrase ".now" to insert current time)  Test: TROPONIN 0.04 Critical Value: 0.04  Name of Provider Notified: ADMISSION MD R DAVID  Orders Received? Or Actions Taken?: Actions Taken: NONE VERBAL. MD STATES SHE WILL WRITE ORDERS

## 2016-12-23 ENCOUNTER — Telehealth: Payer: Self-pay

## 2016-12-23 DIAGNOSIS — I10 Essential (primary) hypertension: Secondary | ICD-10-CM

## 2016-12-23 DIAGNOSIS — F341 Dysthymic disorder: Secondary | ICD-10-CM | POA: Diagnosis not present

## 2016-12-23 DIAGNOSIS — J9601 Acute respiratory failure with hypoxia: Secondary | ICD-10-CM | POA: Diagnosis not present

## 2016-12-23 DIAGNOSIS — F1910 Other psychoactive substance abuse, uncomplicated: Secondary | ICD-10-CM

## 2016-12-23 DIAGNOSIS — I5043 Acute on chronic combined systolic (congestive) and diastolic (congestive) heart failure: Secondary | ICD-10-CM | POA: Diagnosis not present

## 2016-12-23 LAB — BASIC METABOLIC PANEL
Anion gap: 8 (ref 5–15)
BUN: 15 mg/dL (ref 6–20)
CALCIUM: 9.6 mg/dL (ref 8.9–10.3)
CHLORIDE: 96 mmol/L — AB (ref 101–111)
CO2: 34 mmol/L — AB (ref 22–32)
CREATININE: 1.05 mg/dL (ref 0.61–1.24)
GFR calc non Af Amer: >60 mL/min (ref >60)
Glucose, Bld: 120 mg/dL — ABNORMAL HIGH (ref 65–99)
POTASSIUM: 3.5 mmol/L (ref 3.5–5.1)
Sodium: 138 mmol/L (ref 135–145)

## 2016-12-23 LAB — HIV ANTIBODY (ROUTINE TESTING W REFLEX): HIV Screen 4th Generation wRfx: NONREACTIVE

## 2016-12-23 LAB — TROPONIN I: Troponin I: 0.04 ng/mL (ref <0.03)

## 2016-12-23 MED ORDER — ACETAMINOPHEN 500 MG PO TABS: 1000.0000 mg | ORAL_TABLET | Freq: Three times a day (TID) | ORAL | 0 refills | Status: AC

## 2016-12-23 MED ORDER — FUROSEMIDE 40 MG PO TABS: 40.0000 mg | ORAL_TABLET | Freq: Every day | ORAL | 0 refills | Status: DC

## 2016-12-23 MED ORDER — ISOSORB DINITRATE-HYDRALAZINE 20-37.5 MG PO TABS: 1.0000 | ORAL_TABLET | Freq: Three times a day (TID) | ORAL | 0 refills | Status: DC

## 2016-12-23 MED ORDER — SPIRONOLACTONE 25 MG PO TABS: 25.0000 mg | ORAL_TABLET | Freq: Every day | ORAL | 0 refills | Status: DC

## 2016-12-23 MED ORDER — HYDROCODONE-ACETAMINOPHEN 5-325 MG PO TABS: 1.0000 | ORAL_TABLET | Freq: Four times a day (QID) | ORAL | 0 refills | Status: DC | PRN

## 2016-12-23 MED ORDER — CYCLOBENZAPRINE HCL 5 MG PO TABS
5.0000 mg | ORAL_TABLET | Freq: Three times a day (TID) | ORAL | Status: DC
  Administered 2016-12-23 (×2): 5 mg via ORAL
  Filled 2016-12-23 (×2): qty 1

## 2016-12-23 MED ORDER — ACETAMINOPHEN 500 MG PO TABS
1000.0000 mg | ORAL_TABLET | Freq: Three times a day (TID) | ORAL | Status: DC
  Administered 2016-12-23: 1000 mg via ORAL
  Filled 2016-12-23: qty 2

## 2016-12-23 MED ORDER — ENTRESTO 49-51 MG PO TABS: 1.0000 | ORAL_TABLET | Freq: Two times a day (BID) | ORAL | 0 refills | Status: DC

## 2016-12-23 MED ORDER — CARVEDILOL 6.25 MG PO TABS: 6.2500 mg | ORAL_TABLET | Freq: Two times a day (BID) | ORAL | 0 refills | Status: DC

## 2016-12-23 MED ORDER — CYCLOBENZAPRINE HCL 5 MG PO TABS: 5.0000 mg | ORAL_TABLET | Freq: Three times a day (TID) | ORAL | 0 refills | Status: DC

## 2016-12-23 NOTE — Telephone Encounter (Signed)
Message received from Lanier Clam, RN CM requesting transportation for the patient to pick up medications at [facility], Informed her that the patient can apply for SCAT as [facility] does not provide transportation to [facility] pharmacy.

## 2016-12-23 NOTE — Discharge Summary (Addendum)
Physician Discharge Summary  EDOUARD GIKAS Wells:[id number] DOB: Jun 17, 1965 DOA: 12/22/2016  PCP: [name], MD  Admit date: 12/22/2016 Discharge date: 12/23/2016  Time spent: 55 minutes  Recommendations for Outpatient Follow-up:  1. Follow-up with [name], cardiology as scheduled 12/24/2016.  On follow-up patient will need a basic metabolic profile done to follow-up on electrolytes and renal function. 2. Follow-up with [name], MD in 1-2 weeks.  Patient chronic back pain will need to be addressed on follow-up as well as his chronic medical issues.   Discharge Diagnoses:  Principal Problem:   CHF exacerbation (HCC) Active Problems:   Polysubstance abuse (HCC)   Acute respiratory failure with hypoxia (HCC)   Alcoholic cardiomyopathy (HCC)   Accelerated hypertension   Cocaine abuse (HCC)   Major depression, chronic   Discharge Condition: stble and improved  Diet recommendation: Heart healthy  Filed Weights   12/22/16 0912 12/22/16 1945 12/23/16 0606  Weight: 66.7 kg (147 lb) 66.7 kg (147 lb) 66.3 kg (146 lb 2.6 oz)    History of present illness:  Per Dr [name] is a 51 y.o. male with medical history significant of HTN, CHF, cocaine abuse comes in for the second time this week for sob, chf exacerbation secondary to noncompliance with meds.  Pt was here on 11/27 for same and was suppose to follow up with his PCP the next day to get his refills, he has not done that yet.  He did not like to take his lasix as it makes him urinate all night.  He denied any chest pain or le edema or swelling.  His sbp was around 200 today.  He has also ran out of his other bp meds.  Denied fevers or cough.  Pt hypoxic with oxygen sats in the mid 80 range on RA.  He does not require supplemental oxygen at home.  Pt referred for admission for chf exacerbation.    Hospital Course:  #1 acute respiratory failure with hypoxia secondary to acute on chronic systolic and diastolic  CHF exacerbation/cardiomyopathy Patient presented with worsening shortness of breath also noted to have systolic blood pressures in the 200s.  It was felt patient's acute respiratory failure with hypoxia was likely secondary to acute systolic heart failure secondary to noncompliance with medications.  Check enzymes which were cycled with minimally elevated and slowly trended down/plateaued and felt likely secondary to acute CHF exacerbation and hypertension.  Patient had a 2D echo done in April 08, 2016 which showed an EF of 15-20%, severe diffuse hypokinesis with regional variations, grade 1 diastolic dysfunction and as such 2D echo was not repeated.  Patient stated had no way to go to the pharmacy to get refills on his medications and had run out.  Patient was admitted placed on Lasix 40 mg IV every 12 hours and continued on his home regimen of Coreg, aspirin, BiDil, Entresto, spironolactone.  Patient diuresed well and was -2.2 L by day of discharge.  She did improve clinically 99% on room air and on ambulation without any significant shortness of breath.  Patient's blood pressure also improved.  Case manager was consulted who followed the patient to help with his needs.  Patient will be discharged home in stable and improved condition on his home regimen of diuretics and cardiac medications.  Patient was given new prescriptions.  Patient is to follow-up with his cardiologist as scheduled on 12/24/2016.  Arrangements were made per case manager to help patient back and forth to his  pharmacy in order to get his refills.  2.  Hypertensive urgency Secondary to medical noncompliance.  It was noted per admitting physician that on admission patient has systolic blood pressures in the 200s.  Patient stated had run out of his medications and had no way to go to the pharmacy to get his refills.  Patient was placed on his home regimen of medications of Coreg, BiDil, Entresto, spironolactone with significant improvement  with his blood pressure.  On day of discharge patient's blood pressure was down to 123/86.  Outpatient follow-up with cardiology as scheduled 12/24/2016.  3.  Polysubstance abuse Patient noted to have cocaine on UDS.  Patient stated that had resorted back to cocaine secondary to his chronic back pain.  Cessation was stressed to patient.  Lidoderm patch was placed during the hospitalization however patient stated no significant improvement.  Patient was placed on Tylenol 1 g 3 times daily.  Patient will be discharged home on Vicodin as needed.  Follow-up with PCP in the outpatient setting.  4.  History of CVA Continued on aspirin for secondary stroke prophylaxis.  Risk factor modification.   #5 major depression Stable.   #6 chronic back pain Lidoderm patch was placed on admission however patient stated no significant improvement with his chronic back pain.  Patient was placed on Tylenol 1 g 3 times daily.  Patient will be discharged on some Vicodin as needed until he can follow-up with his PCP for further management.   Procedures:  None  Consultations:  None  Discharge Exam: Vitals:   12/23/16 1024 12/23/16 1025  BP:    Pulse:    Resp:    Temp:    SpO2: 99% 99%    General: NAD Cardiovascular: RRR Respiratory: CTAB  Discharge Instructions   Discharge Instructions    Diet - low sodium heart healthy   Complete by:  As directed    Increase activity slowly   Complete by:  As directed      Allergies as of 12/23/2016      Reactions   Penicillins Other (See Comments)   Blisters  Has patient had a PCN reaction causing immediate rash, facial/tongue/throat swelling, SOB or lightheadedness with hypotension: Yes Has patient had a PCN reaction causing severe rash involving mucus membranes or skin necrosis: Yes Has patient had a PCN reaction that required hospitalization: No Has patient had a PCN reaction occurring within the last 10 years: No If all of the above answers are  "NO", then may proceed with Cephalosporin use.      Medication List    STOP taking these medications   varenicline 0.5 MG X 11 & 1 MG X 42 tablet Commonly known as:  CHANTIX STARTING MONTH PAK   varenicline 1 MG tablet Commonly known as:  CHANTIX CONTINUING MONTH PAK     TAKE these medications   acetaminophen 500 MG tablet Commonly known as:  TYLENOL Take 2 tablets (1,000 mg total) by mouth every 8 (eight) hours for 5 days.   aspirin 325 MG EC tablet Take 1 tablet (325 mg total) by mouth daily.   carvedilol 6.25 MG tablet Commonly known as:  COREG Take 1 tablet (6.25 mg total) by mouth 2 (two) times daily with a meal.   cyclobenzaprine 5 MG tablet Commonly known as:  FLEXERIL Take 1 tablet (5 mg total) by mouth 3 (three) times daily.   ENTRESTO 49-51 MG Generic drug:  sacubitril-valsartan Take 1 tablet by mouth 2 (two) times daily.   furosemide  40 MG tablet Commonly known as:  LASIX Take 1 tablet (40 mg total) by mouth daily. What changed:  Another medication with the same name was removed. Continue taking this medication, and follow the directions you see here.   HYDROcodone-acetaminophen 5-325 MG tablet Commonly known as:  NORCO/VICODIN Take 1 tablet by mouth every 6 (six) hours as needed for severe pain.   isosorbide-hydrALAZINE 20-37.5 MG tablet Commonly known as:  BIDIL Take 1 tablet by mouth 3 (three) times daily.   multivitamin with minerals Tabs tablet Take 1 tablet by mouth daily.   spironolactone 25 MG tablet Commonly known as:  ALDACTONE Take 1 tablet (25 mg total) by mouth daily.      Allergies  Allergen Reactions  . Penicillins Other (See Comments)    Blisters  Has patient had a PCN reaction causing immediate rash, facial/tongue/throat swelling, SOB or lightheadedness with hypotension: Yes Has patient had a PCN reaction causing severe rash involving mucus membranes or skin necrosis: Yes Has patient had a PCN reaction that required  hospitalization: No Has patient had a PCN reaction occurring within the last 10 years: No If all of the above answers are "NO", then may proceed with Cephalosporin use.    Follow-up Information    [name], MD. Schedule an appointment as soon as possible for a visit in 1 week(s).   Specialty:  Family Medicine Why:  f/u in 1-2 weeks. Contact information: [address] [place] [address] [phone]        Chilton Si, MD Follow up on 12/24/2016.   Specialty:  Cardiology Why:  f/u as scheduled Contact information: [address] [place] [address] [place] [address] [phone]            The results of significant diagnostics from this hospitalization (including imaging, microbiology, ancillary and laboratory) are listed below for reference.    Significant Diagnostic Studies: Dg Chest 2 View  Result Date: 12/22/2016 CLINICAL DATA:  51 year old male with shortness of breath. EXAM: CHEST  2 VIEW COMPARISON:  Chest radiograph dated 12/16/2016 FINDINGS: There is mild bronchiectatic changes. No focal consolidation, pleural effusion, or pneumothorax. Stable mild cardiomegaly. No acute osseous pathology. IMPRESSION: No active cardiopulmonary disease.  Mild bronchiectasis. Stable mild cardiomegaly. Electronically Signed   By: Elgie Collard M.D.   On: 12/22/2016 06:15   Dg Chest 2 View  Result Date: 12/16/2016 CLINICAL DATA:  51 year old male with productive cough for 1 week and shortness of breath. Chest pain and shortness of breath. Hypertensive on presentation (systolic 226). EXAM: CHEST  2 VIEW COMPARISON:  Chest radiographs 02/25/2015 and earlier. FINDINGS: Small right side versus bilateral pleural effusions. Mild streaky bilateral perihilar opacity appears fairly symmetric. Trace fluid in the right major fissure. No pneumothorax. No consolidation or other confluent opacity. Cardiomegaly appears increased since 2017. Other mediastinal contours are within normal  limits. Visualized tracheal air column is within normal limits. No acute osseous abnormality identified. Negative visible bowel gas pattern. IMPRESSION: Small pleural effusion(s) with nonspecific streaky perihilar opacity and increased cardiac silhouette since 2017. Favor mild/developing pulmonary edema. Bilateral respiratory infection less likely. Electronically Signed   By: Odessa Fleming M.D.   On: 12/16/2016 08:04    Microbiology: Recent Results (from the past 240 hour(s))  Urine culture     Status: None   Collection Time: 12/16/16  7:40 AM  Result Value Ref Range Status   Specimen Description URINE, RANDOM  Final   Special Requests NONE  Final   Culture  Final    NO GROWTH Performed at [facility], [address], [place], [place] [address]    Report Status 12/17/2016 FINAL  Final     Labs: Basic Metabolic Panel: Recent Labs  Lab 12/22/16 0508 12/23/16 0001  NA 139 138  K 3.9 3.5  CL 105 96*  CO2 23 34*  GLUCOSE 99 120*  BUN 11 15  CREATININE 0.70 1.05  CALCIUM 9.9 9.6   Liver Function Tests: Recent Labs  Lab 12/22/16 0508  AST 30  ALT 23  ALKPHOS 73  BILITOT 0.7  PROT 7.1  ALBUMIN 4.2   No results for input(s): LIPASE, AMYLASE in the last 168 hours. No results for input(s): AMMONIA in the last 168 hours. CBC: Recent Labs  Lab 12/22/16 0508  WBC 6.5  NEUTROABS 5.1  HGB 14.3  HCT 40.9  MCV 99.0  PLT 218   Cardiac Enzymes: Recent Labs  Lab 12/22/16 0508 12/22/16 1158 12/22/16 1736 12/23/16 0001  TROPONINI 0.04* 0.06* 0.05* 0.04*   BNP: BNP (last 3 results) Recent Labs    12/16/16 0733 12/22/16 0508  BNP 1,002.7* 1,096.9*    ProBNP (last 3 results) No results for input(s): PROBNP in the last 8760 hours.  CBG: No results for input(s): GLUCAP in the last 168 hours.     Signed:  Ramiro Harvest MD.  [facility] 12/23/2016, 2:29 PM

## 2016-12-23 NOTE — Care Management Note (Signed)
Case Management Note  Patient Details  Name: JUSTICE SAWAYA MRN: [id number] Date of Birth: Jan 19, 1966  Subjective/Objective: Provided patient w/[facility] pharmacy address-we can take his scripts there to fill-patient has medicaid & understands his obligation to his co pay $3.  No further CM needs.                 Action/Plan:d/c home.   Expected Discharge Date:  (unknown)               Expected Discharge Plan:  Home/Self Care  In-House Referral:  Clinical Social Work  Discharge planning Services  CM Consult, Medication Assistance  Post Acute Care Choice:    Choice offered to:     DME Arranged:    DME Agency:     HH Arranged:    HH Agency:     Status of Service:  Completed, signed off  If discussed at [employer], dates discussed:    Additional Comments:  Lanier Clam, RN 12/23/2016, 1:21 PM

## 2016-12-23 NOTE — Care Management Note (Signed)
Case Management Note  Patient Details  Name: [name] MRN: [id number] Date of Birth: 04-23-1965  Subjective/Objective: 51 y/o m admitted w/CHF. Noted [id number] abuse.From home. Has pcp,pharmacy. CM referral for CHF screen-1 adm/56months;1 ed/51months-Not appropriate for CHF protocal.Noted on 02-will monitor if needed @ home.                 Action/Plan:d/c home.   Expected Discharge Date:  (unknown)               Expected Discharge Plan:  Home/Self Care  In-House Referral:     Discharge planning Services  CM Consult  Post Acute Care Choice:    Choice offered to:     DME Arranged:    DME Agency:     HH Arranged:    HH Agency:     Status of Service:  In process, will continue to follow  If discussed at Long Length of Stay Meetings, dates discussed:    Additional Comments:  Lanier Clam, RN 12/23/2016, 9:36 AM

## 2016-12-23 NOTE — Care Management Note (Signed)
Case Management Note  Patient Details  Name: [name] MRN: [id number] Date of Birth: Oct 15, 1965  Subjective/Objective: Once scripts are written will insure [facility] will have meds available for patient to go there to pick up. CSW will provide bus pass for home, & also provide resource for ongoing transportation w/[employer].                   Action/Plan:d/c plan home.   Expected Discharge Date:  (unknown)               Expected Discharge Plan:  Home/Self Care  In-House Referral:  Clinical Social Work  Discharge planning Services  CM Consult, Medication Assistance  Post Acute Care Choice:    Choice offered to:     DME Arranged:    DME Agency:     HH Arranged:    HH Agency:     Status of Service:  Completed, signed off  If discussed at [employer], dates discussed:    Additional Comments:  Lanier Clam, RN 12/23/2016, 11:55 AM

## 2016-12-24 ENCOUNTER — Ambulatory Visit: Payer: Medicaid Other | Admitting: Cardiovascular Disease

## 2016-12-24 MED FILL — HYDROCODON-APAP 5-325: 5-325 | 3 days supply | Qty: 15 | Fill #0

## 2016-12-24 MED FILL — BIDIL TABLET: 20-37.5 | 30 days supply | Qty: 90 | Fill #0

## 2016-12-24 MED FILL — CYCLOBENZAPRINE 5 MG TABLET: 5 | 10 days supply | Qty: 30 | Fill #0

## 2017-01-07 ENCOUNTER — Ambulatory Visit: Payer: Medicaid Other | Admitting: Cardiovascular Disease
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
## 2017-02-04 ENCOUNTER — Ambulatory Visit: Payer: Medicaid Other | Admitting: Family Medicine

## 2017-02-26 ENCOUNTER — Encounter: Payer: Self-pay | Admitting: Gastroenterology

## 2017-02-26 ENCOUNTER — Ambulatory Visit: Payer: Medicaid Other | Attending: Family Medicine | Admitting: Family Medicine

## 2017-02-26 ENCOUNTER — Encounter: Payer: Self-pay | Admitting: Family Medicine

## 2017-02-26 VITALS — BP 157/104 | HR 99 | Temp 98.9°F | Resp 16 | Ht 68.0 in | Wt 156.4 lb

## 2017-02-26 DIAGNOSIS — I5042 Chronic combined systolic (congestive) and diastolic (congestive) heart failure: Secondary | ICD-10-CM | POA: Diagnosis not present

## 2017-02-26 DIAGNOSIS — Z7982 Long term (current) use of aspirin: Secondary | ICD-10-CM | POA: Diagnosis not present

## 2017-02-26 DIAGNOSIS — M47816 Spondylosis without myelopathy or radiculopathy, lumbar region: Secondary | ICD-10-CM | POA: Diagnosis not present

## 2017-02-26 DIAGNOSIS — Z880 Allergy status to penicillin: Secondary | ICD-10-CM | POA: Insufficient documentation

## 2017-02-26 DIAGNOSIS — Z1211 Encounter for screening for malignant neoplasm of colon: Secondary | ICD-10-CM

## 2017-02-26 DIAGNOSIS — G8929 Other chronic pain: Secondary | ICD-10-CM | POA: Diagnosis not present

## 2017-02-26 DIAGNOSIS — R05 Cough: Secondary | ICD-10-CM | POA: Insufficient documentation

## 2017-02-26 DIAGNOSIS — Z79899 Other long term (current) drug therapy: Secondary | ICD-10-CM | POA: Diagnosis not present

## 2017-02-26 DIAGNOSIS — I110 Hypertensive heart disease with heart failure: Secondary | ICD-10-CM | POA: Diagnosis not present

## 2017-02-26 DIAGNOSIS — M545 Low back pain: Secondary | ICD-10-CM | POA: Insufficient documentation

## 2017-02-26 DIAGNOSIS — I69354 Hemiplegia and hemiparesis following cerebral infarction affecting left non-dominant side: Secondary | ICD-10-CM | POA: Insufficient documentation

## 2017-02-26 DIAGNOSIS — R059 Cough, unspecified: Secondary | ICD-10-CM

## 2017-02-26 DIAGNOSIS — I10 Essential (primary) hypertension: Secondary | ICD-10-CM

## 2017-02-26 DIAGNOSIS — M549 Dorsalgia, unspecified: Secondary | ICD-10-CM | POA: Diagnosis present

## 2017-02-26 MED ORDER — ISOSORB DINITRATE-HYDRALAZINE 20-37.5 MG PO TABS: 1.0000 | ORAL_TABLET | Freq: Three times a day (TID) | ORAL | 3 refills | Status: DC

## 2017-02-26 MED ORDER — FUROSEMIDE 40 MG PO TABS: 40.0000 mg | ORAL_TABLET | Freq: Every day | ORAL | 3 refills | Status: DC

## 2017-02-26 MED ORDER — CYCLOBENZAPRINE HCL 5 MG PO TABS: 5.0000 mg | ORAL_TABLET | Freq: Two times a day (BID) | ORAL | 1 refills | Status: DC | PRN

## 2017-02-26 MED ORDER — ENTRESTO 49-51 MG PO TABS: 1.0000 | ORAL_TABLET | Freq: Two times a day (BID) | ORAL | 3 refills | Status: DC

## 2017-02-26 MED ORDER — CARVEDILOL 12.5 MG PO TABS
12.5000 mg | ORAL_TABLET | Freq: Two times a day (BID) | ORAL | 3 refills | Status: DC
Start: 2017-02-26 — End: 2017-07-27

## 2017-02-26 MED ORDER — GUAIFENESIN ER 600 MG PO TB12: 600.0000 mg | ORAL_TABLET | Freq: Two times a day (BID) | ORAL | 0 refills | Status: DC

## 2017-02-26 MED ORDER — SPIRONOLACTONE 25 MG PO TABS: 25.0000 mg | ORAL_TABLET | Freq: Every day | ORAL | 3 refills | Status: DC

## 2017-02-26 NOTE — Progress Notes (Signed)
Patient stated he have back pain for two months now.   Patient request medication refills and would like to talk about referrals for colonoscopy.    Patient have a cough for four days and stated he would like to have Rx for the mucus.
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
## 2017-02-26 NOTE — Patient Instructions (Signed)

## 2017-04-02 ENCOUNTER — Telehealth: Payer: Self-pay

## 2017-04-02 DIAGNOSIS — Z1211 Encounter for screening for malignant neoplasm of colon: Secondary | ICD-10-CM

## 2017-04-02 NOTE — Telephone Encounter (Signed)
[name],  Thanks for your diligence in chart review.  I agree he cannot be directly booked for screening colonoscopy.  He is high risk for elective procedure.  Cancel [facility] visit and [facility] colonoscopy date.  I will copy this to his PCP and suggest alternate screening method.   Dr. Alvis Lemmings,    Thank you for referring your patient to [name].  His cardiac condition puts him at high risk for complications of procedures.  Current screening guidelines recommend consideration of colon cancer screening for patients with a life expectancy of at least 10 years.  If you believe this to be the case for [name], and if you think he could reasonably undergo a colonoscopy if necessary, please consider a Cologuard test.  It is non-invasive and a good option for such a patient.  He would have to be willing and able to undergo a colonoscopy if the Cologuard test was positive.   Amada Jupiter, MD    [facility]

## 2017-04-02 NOTE — Telephone Encounter (Signed)
Dr Myrtie Neither, This pt is scheduled for his colon on 04/29/17.  After reviewing his past medical history, pt's echo from 04/08/16 shows his EF of 15%!  Does pt need an office visit first or is an direct hospital appointment OK? Please advise. Thanks, Cherylann Ratel ( PV)

## 2017-04-03 NOTE — Telephone Encounter (Signed)
Spoke with pt and informed him per Dr Myrtie Neither, his PV on 04/15/17 and colon on 04/29/17 will be cx'd due to patient is at high risk for a colonoscopy.  Informed pt, his PCP will contact him and advise other options for him. I answered all the patient's questions and will cancel his PV and colon appt at this time.  He will call back if he has further problems. Cherylann Ratel

## 2017-04-06 NOTE — Addendum Note (Signed)
Addended by: Jonah Blue B on: 04/06/2017 10:47 PM   Modules accepted: Orders

## 2017-04-08 ENCOUNTER — Telehealth: Payer: Self-pay

## 2017-04-08 NOTE — Telephone Encounter (Signed)
Error

## 2017-04-08 NOTE — Telephone Encounter (Signed)
Yes he can.  It looks like Dr. Wynetta Emery already placed the order and he can come pick up the kit.

## 2017-04-08 NOTE — Telephone Encounter (Signed)
Can this patient use the cologuard kit.

## 2017-04-08 NOTE — Telephone Encounter (Signed)
Patient was called and informed that he could come pick up the kit at anytime.

## 2017-04-29 ENCOUNTER — Encounter: Payer: Medicaid Other | Admitting: Gastroenterology

## 2017-05-27 ENCOUNTER — Ambulatory Visit: Payer: Medicaid Other | Admitting: Family Medicine

## 2017-07-07 ENCOUNTER — Ambulatory Visit: Payer: Medicaid Other | Admitting: Family Medicine

## 2017-07-20 DIAGNOSIS — I63511 Cerebral infarction due to unspecified occlusion or stenosis of right middle cerebral artery: Secondary | ICD-10-CM | POA: Diagnosis not present

## 2017-07-21 DIAGNOSIS — I63511 Cerebral infarction due to unspecified occlusion or stenosis of right middle cerebral artery: Secondary | ICD-10-CM | POA: Diagnosis not present

## 2017-07-22 DIAGNOSIS — I63511 Cerebral infarction due to unspecified occlusion or stenosis of right middle cerebral artery: Secondary | ICD-10-CM | POA: Diagnosis not present

## 2017-07-23 DIAGNOSIS — I63511 Cerebral infarction due to unspecified occlusion or stenosis of right middle cerebral artery: Secondary | ICD-10-CM | POA: Diagnosis not present

## 2017-07-24 DIAGNOSIS — I63511 Cerebral infarction due to unspecified occlusion or stenosis of right middle cerebral artery: Secondary | ICD-10-CM | POA: Diagnosis not present

## 2017-07-25 DIAGNOSIS — I63511 Cerebral infarction due to unspecified occlusion or stenosis of right middle cerebral artery: Secondary | ICD-10-CM | POA: Diagnosis not present

## 2017-07-26 ENCOUNTER — Other Ambulatory Visit: Payer: Self-pay

## 2017-07-26 ENCOUNTER — Encounter (HOSPITAL_COMMUNITY): Payer: Self-pay | Admitting: Emergency Medicine

## 2017-07-26 ENCOUNTER — Emergency Department (HOSPITAL_COMMUNITY): Payer: Medicaid Other

## 2017-07-26 ENCOUNTER — Inpatient Hospital Stay (HOSPITAL_COMMUNITY)
Admission: EM | Admit: 2017-07-26 | Discharge: 2017-07-27 | DRG: 291 | Disposition: A | Discharge status: Home / Self Care | Payer: Medicaid Other | Attending: Family Medicine | Admitting: Family Medicine

## 2017-07-26 DIAGNOSIS — R0602 Shortness of breath: Secondary | ICD-10-CM | POA: Diagnosis not present

## 2017-07-26 DIAGNOSIS — I63511 Cerebral infarction due to unspecified occlusion or stenosis of right middle cerebral artery: Secondary | ICD-10-CM | POA: Diagnosis not present

## 2017-07-26 DIAGNOSIS — I5042 Chronic combined systolic (congestive) and diastolic (congestive) heart failure: Secondary | ICD-10-CM | POA: Diagnosis present

## 2017-07-26 DIAGNOSIS — Z9114 Patient's other noncompliance with medication regimen: Secondary | ICD-10-CM | POA: Diagnosis not present

## 2017-07-26 DIAGNOSIS — R7989 Other specified abnormal findings of blood chemistry: Secondary | ICD-10-CM | POA: Diagnosis present

## 2017-07-26 DIAGNOSIS — M549 Dorsalgia, unspecified: Secondary | ICD-10-CM | POA: Diagnosis present

## 2017-07-26 DIAGNOSIS — Z8349 Family history of other endocrine, nutritional and metabolic diseases: Secondary | ICD-10-CM

## 2017-07-26 DIAGNOSIS — F1410 Cocaine abuse, uncomplicated: Secondary | ICD-10-CM | POA: Diagnosis present

## 2017-07-26 DIAGNOSIS — R0600 Dyspnea, unspecified: Secondary | ICD-10-CM | POA: Diagnosis not present

## 2017-07-26 DIAGNOSIS — I5043 Acute on chronic combined systolic (congestive) and diastolic (congestive) heart failure: Secondary | ICD-10-CM | POA: Diagnosis present

## 2017-07-26 DIAGNOSIS — Z880 Allergy status to penicillin: Secondary | ICD-10-CM | POA: Diagnosis not present

## 2017-07-26 DIAGNOSIS — Z823 Family history of stroke: Secondary | ICD-10-CM | POA: Diagnosis not present

## 2017-07-26 DIAGNOSIS — F1290 Cannabis use, unspecified, uncomplicated: Secondary | ICD-10-CM | POA: Diagnosis present

## 2017-07-26 DIAGNOSIS — Z7982 Long term (current) use of aspirin: Secondary | ICD-10-CM | POA: Diagnosis not present

## 2017-07-26 DIAGNOSIS — G8929 Other chronic pain: Secondary | ICD-10-CM | POA: Diagnosis present

## 2017-07-26 DIAGNOSIS — I10 Essential (primary) hypertension: Secondary | ICD-10-CM | POA: Diagnosis not present

## 2017-07-26 DIAGNOSIS — I161 Hypertensive emergency: Secondary | ICD-10-CM

## 2017-07-26 DIAGNOSIS — R61 Generalized hyperhidrosis: Secondary | ICD-10-CM | POA: Diagnosis not present

## 2017-07-26 DIAGNOSIS — Z8673 Personal history of transient ischemic attack (TIA), and cerebral infarction without residual deficits: Secondary | ICD-10-CM

## 2017-07-26 DIAGNOSIS — I426 Alcoholic cardiomyopathy: Secondary | ICD-10-CM | POA: Diagnosis present

## 2017-07-26 DIAGNOSIS — Z8249 Family history of ischemic heart disease and other diseases of the circulatory system: Secondary | ICD-10-CM

## 2017-07-26 DIAGNOSIS — F17210 Nicotine dependence, cigarettes, uncomplicated: Secondary | ICD-10-CM | POA: Diagnosis present

## 2017-07-26 DIAGNOSIS — R069 Unspecified abnormalities of breathing: Secondary | ICD-10-CM | POA: Diagnosis not present

## 2017-07-26 DIAGNOSIS — R0789 Other chest pain: Secondary | ICD-10-CM | POA: Diagnosis not present

## 2017-07-26 DIAGNOSIS — F1910 Other psychoactive substance abuse, uncomplicated: Secondary | ICD-10-CM | POA: Diagnosis present

## 2017-07-26 DIAGNOSIS — F1010 Alcohol abuse, uncomplicated: Secondary | ICD-10-CM | POA: Diagnosis present

## 2017-07-26 DIAGNOSIS — J9601 Acute respiratory failure with hypoxia: Secondary | ICD-10-CM | POA: Diagnosis not present

## 2017-07-26 DIAGNOSIS — I428 Other cardiomyopathies: Secondary | ICD-10-CM

## 2017-07-26 DIAGNOSIS — Z79899 Other long term (current) drug therapy: Secondary | ICD-10-CM | POA: Diagnosis not present

## 2017-07-26 DIAGNOSIS — I110 Hypertensive heart disease with heart failure: Secondary | ICD-10-CM | POA: Diagnosis present

## 2017-07-26 DIAGNOSIS — G4733 Obstructive sleep apnea (adult) (pediatric): Secondary | ICD-10-CM | POA: Diagnosis present

## 2017-07-26 DIAGNOSIS — R609 Edema, unspecified: Secondary | ICD-10-CM | POA: Diagnosis not present

## 2017-07-26 DIAGNOSIS — F1020 Alcohol dependence, uncomplicated: Secondary | ICD-10-CM | POA: Diagnosis present

## 2017-07-26 DIAGNOSIS — R945 Abnormal results of liver function studies: Secondary | ICD-10-CM

## 2017-07-26 DIAGNOSIS — R000 Tachycardia, unspecified: Secondary | ICD-10-CM | POA: Diagnosis not present

## 2017-07-26 DIAGNOSIS — J9600 Acute respiratory failure, unspecified whether with hypoxia or hypercapnia: Secondary | ICD-10-CM | POA: Diagnosis present

## 2017-07-26 LAB — COMPREHENSIVE METABOLIC PANEL
ALT: 91 U/L — AB (ref 0–44)
AST: 171 U/L — ABNORMAL HIGH (ref 15–41)
Albumin: 3.9 g/dL (ref 3.5–5.0)
Alkaline Phosphatase: 128 U/L — ABNORMAL HIGH (ref 38–126)
Anion gap: 6 (ref 5–15)
BILIRUBIN TOTAL: 0.7 mg/dL (ref 0.3–1.2)
BUN: 10 mg/dL (ref 6–20)
CALCIUM: 9.2 mg/dL (ref 8.9–10.3)
CHLORIDE: 113 mmol/L — AB (ref 98–111)
CO2: 23 mmol/L (ref 22–32)
CREATININE: 1.04 mg/dL (ref 0.61–1.24)
Glucose, Bld: 107 mg/dL — ABNORMAL HIGH (ref 70–99)
Potassium: 4.4 mmol/L (ref 3.5–5.1)
Sodium: 142 mmol/L (ref 135–145)
Total Protein: 6.6 g/dL (ref 6.5–8.1)

## 2017-07-26 LAB — BRAIN NATRIURETIC PEPTIDE: B NATRIURETIC PEPTIDE 5: 1872.8 pg/mL — AB (ref 0.0–100.0)

## 2017-07-26 LAB — CBC WITH DIFFERENTIAL/PLATELET
ABS IMMATURE GRANULOCYTES: 0 10*3/uL (ref 0.0–0.1)
BASOS PCT: 1 %
Basophils Absolute: 0 10*3/uL (ref 0.0–0.1)
EOS ABS: 0 10*3/uL (ref 0.0–0.7)
Eosinophils Relative: 1 %
HEMATOCRIT: 40.1 % (ref 39.0–52.0)
Hemoglobin: 13.3 g/dL (ref 13.0–17.0)
Immature Granulocytes: 0 %
LYMPHS PCT: 16 %
Lymphs Abs: 1 10*3/uL (ref 0.7–4.0)
MCH: 34.2 pg — AB (ref 26.0–34.0)
MCHC: 33.2 g/dL (ref 30.0–36.0)
MCV: 103.1 fL — ABNORMAL HIGH (ref 78.0–100.0)
MONOS PCT: 10 %
Monocytes Absolute: 0.6 10*3/uL (ref 0.1–1.0)
Neutro Abs: 4.7 10*3/uL (ref 1.7–7.7)
Neutrophils Relative %: 72 %
PLATELETS: 166 10*3/uL (ref 150–400)
RBC: 3.89 MIL/uL — ABNORMAL LOW (ref 4.22–5.81)
RDW: 13.8 % (ref 11.5–15.5)
WBC: 6.5 10*3/uL (ref 4.0–10.5)

## 2017-07-26 LAB — RAPID URINE DRUG SCREEN, HOSP PERFORMED
Amphetamines: NOT DETECTED
Benzodiazepines: NOT DETECTED
Cocaine: POSITIVE — AB
Opiates: NOT DETECTED
Tetrahydrocannabinol: POSITIVE — AB

## 2017-07-26 LAB — TROPONIN I: TROPONIN I: 0.03 ng/mL — AB (ref <0.03)

## 2017-07-26 MED ORDER — LORAZEPAM 1 MG PO TABS: 0.0000 mg | ORAL_TABLET | Freq: Two times a day (BID) | ORAL | Status: DC

## 2017-07-26 MED ORDER — LORAZEPAM 1 MG PO TABS
0.0000 mg | ORAL_TABLET | Freq: Four times a day (QID) | ORAL | Status: DC
  Administered 2017-07-26: 2 mg via ORAL
  Administered 2017-07-26: 1 mg via ORAL
  Filled 2017-07-26: qty 2
  Filled 2017-07-26 (×2): qty 1

## 2017-07-26 MED ORDER — ISOSORB DINITRATE-HYDRALAZINE 20-37.5 MG PO TABS
1.0000 | ORAL_TABLET | Freq: Once | ORAL | Status: AC
  Administered 2017-07-26: 1 via ORAL
  Filled 2017-07-26: qty 1

## 2017-07-26 MED ORDER — ASPIRIN 81 MG PO CHEW
324.0000 mg | CHEWABLE_TABLET | Freq: Once | ORAL | Status: AC
  Administered 2017-07-26: 324 mg via ORAL
  Filled 2017-07-26: qty 4

## 2017-07-26 MED ORDER — NITROGLYCERIN IN D5W 200-5 MCG/ML-% IV SOLN
0.0000 ug/min | Freq: Once | INTRAVENOUS | Status: AC
Start: 2017-07-26 — End: 2017-07-26
  Administered 2017-07-26: 5 ug/min via INTRAVENOUS
  Filled 2017-07-26: qty 250

## 2017-07-26 MED ORDER — SODIUM CHLORIDE 0.9% FLUSH: 3.0000 mL | INTRAVENOUS | Status: DC | PRN

## 2017-07-26 MED ORDER — SODIUM CHLORIDE 0.9 % IV SOLN: 250.0000 mL | INTRAVENOUS | Status: DC | PRN

## 2017-07-26 MED ORDER — ONDANSETRON HCL 4 MG/2ML IJ SOLN: 4.0000 mg | Freq: Four times a day (QID) | INTRAMUSCULAR | Status: DC | PRN

## 2017-07-26 MED ORDER — SPIRONOLACTONE 25 MG PO TABS
25.0000 mg | ORAL_TABLET | Freq: Every day | ORAL | Status: DC
  Administered 2017-07-26 – 2017-07-27 (×2): 25 mg via ORAL
  Filled 2017-07-26 (×2): qty 1

## 2017-07-26 MED ORDER — FUROSEMIDE 20 MG PO TABS
40.0000 mg | ORAL_TABLET | Freq: Every day | ORAL | Status: DC
  Administered 2017-07-26 – 2017-07-27 (×2): 40 mg via ORAL
  Filled 2017-07-26 (×2): qty 2

## 2017-07-26 MED ORDER — FUROSEMIDE 10 MG/ML IJ SOLN
40.0000 mg | Freq: Once | INTRAMUSCULAR | Status: AC
Start: 2017-07-26 — End: 2017-07-26
  Administered 2017-07-26: 40 mg via INTRAVENOUS
  Filled 2017-07-26: qty 4

## 2017-07-26 MED ORDER — VITAMIN B-1 100 MG PO TABS
100.0000 mg | ORAL_TABLET | Freq: Every day | ORAL | Status: DC
  Administered 2017-07-26 – 2017-07-27 (×2): 100 mg via ORAL
  Filled 2017-07-26 (×2): qty 1

## 2017-07-26 MED ORDER — ACETAMINOPHEN 325 MG PO TABS: 650.0000 mg | ORAL_TABLET | ORAL | Status: DC | PRN

## 2017-07-26 MED ORDER — LORAZEPAM 1 MG PO TABS
1.0000 mg | ORAL_TABLET | Freq: Four times a day (QID) | ORAL | Status: DC | PRN
  Administered 2017-07-26: 1 mg via ORAL

## 2017-07-26 MED ORDER — GUAIFENESIN ER 600 MG PO TB12
600.0000 mg | ORAL_TABLET | Freq: Two times a day (BID) | ORAL | Status: DC | PRN
  Filled 2017-07-26: qty 1

## 2017-07-26 MED ORDER — FOLIC ACID 1 MG PO TABS
1.0000 mg | ORAL_TABLET | Freq: Every day | ORAL | Status: DC
  Administered 2017-07-26 – 2017-07-27 (×2): 1 mg via ORAL
  Filled 2017-07-26 (×2): qty 1

## 2017-07-26 MED ORDER — ADULT MULTIVITAMIN W/MINERALS CH
1.0000 | ORAL_TABLET | Freq: Every day | ORAL | Status: DC
  Administered 2017-07-26 – 2017-07-27 (×2): 1 via ORAL
  Filled 2017-07-26 (×2): qty 1

## 2017-07-26 MED ORDER — ENOXAPARIN SODIUM 40 MG/0.4ML ~~LOC~~ SOLN
40.0000 mg | SUBCUTANEOUS | Status: DC
  Administered 2017-07-26: 40 mg via SUBCUTANEOUS
  Filled 2017-07-26 (×2): qty 0.4

## 2017-07-26 MED ORDER — ASPIRIN EC 325 MG PO TBEC
325.0000 mg | DELAYED_RELEASE_TABLET | Freq: Every day | ORAL | Status: DC
  Administered 2017-07-26 – 2017-07-27 (×2): 325 mg via ORAL
  Filled 2017-07-26 (×2): qty 1

## 2017-07-26 MED ORDER — HYDROCODONE-ACETAMINOPHEN 5-325 MG PO TABS
1.0000 | ORAL_TABLET | Freq: Four times a day (QID) | ORAL | Status: DC | PRN
  Administered 2017-07-26 – 2017-07-27 (×3): 1 via ORAL
  Filled 2017-07-26 (×3): qty 1

## 2017-07-26 MED ORDER — ISOSORB DINITRATE-HYDRALAZINE 20-37.5 MG PO TABS
1.0000 | ORAL_TABLET | Freq: Three times a day (TID) | ORAL | Status: DC
  Administered 2017-07-26 – 2017-07-27 (×5): 1 via ORAL
  Filled 2017-07-26 (×5): qty 1

## 2017-07-26 MED ORDER — LORAZEPAM 2 MG/ML IJ SOLN
1.0000 mg | Freq: Four times a day (QID) | INTRAMUSCULAR | Status: DC | PRN
Start: 2017-07-26 — End: 2017-07-27

## 2017-07-26 MED ORDER — CYCLOBENZAPRINE HCL 10 MG PO TABS: 5.0000 mg | ORAL_TABLET | Freq: Two times a day (BID) | ORAL | Status: DC | PRN

## 2017-07-26 MED ORDER — SODIUM CHLORIDE 0.9% FLUSH
3.0000 mL | Freq: Two times a day (BID) | INTRAVENOUS | Status: DC
  Administered 2017-07-26 – 2017-07-27 (×2): 3 mL via INTRAVENOUS

## 2017-07-26 MED ORDER — THIAMINE HCL 100 MG/ML IJ SOLN
100.0000 mg | Freq: Every day | INTRAMUSCULAR | Status: DC
  Filled 2017-07-26: qty 2

## 2017-07-26 MED ORDER — SPIRONOLACTONE 25 MG PO TABS
25.0000 mg | ORAL_TABLET | Freq: Once | ORAL | Status: AC
  Administered 2017-07-26: 25 mg via ORAL
  Filled 2017-07-26: qty 1

## 2017-07-26 NOTE — ED Notes (Signed)
Report called to 4 east

## 2017-07-26 NOTE — Progress Notes (Signed)
Patient has been on the NIV for a couple of hours overnight. The patient was taken off by night shift therapist at approximately 0615. The patient is resting well at this time without any increased WOB or shortness of breath.

## 2017-07-26 NOTE — Progress Notes (Signed)
Pt has been titrate off the NIV to a 2L Rea tolerating it well. no distress or complications noted.

## 2017-07-26 NOTE — ED Triage Notes (Signed)
Pt wake up today feeling very SOB and diaphoretic, pt denies any cp at this time. Hx of HTN initial BP for EMS 120/138, SPO2 93 RA, pt placed on NR O2 sat up to 100%. Per EMS fluids accumulation on his lung R>L, one Nitro given by EMS pta by EMS BP 170/100, HR  112.
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
## 2017-07-26 NOTE — ED Notes (Signed)
Pt placed on Bipap

## 2017-07-26 NOTE — Progress Notes (Deleted)
Patient was brought in by EMS on CPAP and transitioned to BIPAP 18/5/100% upon arrival. The patient is tolerating well with normal ABG values. The patient has been coughing up pink, frothy secretions. The patient's FIO2 was decreased to 50% after ABG results obtained. Will continue to monitor patient.  

## 2017-07-27 ENCOUNTER — Inpatient Hospital Stay (HOSPITAL_COMMUNITY): Payer: Medicaid Other

## 2017-07-27 DIAGNOSIS — R0600 Dyspnea, unspecified: Secondary | ICD-10-CM

## 2017-07-27 DIAGNOSIS — J9601 Acute respiratory failure with hypoxia: Secondary | ICD-10-CM

## 2017-07-27 LAB — BASIC METABOLIC PANEL
Anion gap: 9 (ref 5–15)
BUN: 7 mg/dL (ref 6–20)
CALCIUM: 9.5 mg/dL (ref 8.9–10.3)
CO2: 29 mmol/L (ref 22–32)
Chloride: 100 mmol/L (ref 98–111)
Creatinine, Ser: 1.03 mg/dL (ref 0.61–1.24)
GFR calc Af Amer: >60 mL/min (ref >60)
GFR calc non Af Amer: >60 mL/min (ref >60)
GLUCOSE: 125 mg/dL — AB (ref 70–99)
Potassium: 3 mmol/L — ABNORMAL LOW (ref 3.5–5.1)
Sodium: 138 mmol/L (ref 135–145)

## 2017-07-27 LAB — HEPATIC FUNCTION PANEL
ALBUMIN: 3.4 g/dL — AB (ref 3.5–5.0)
ALK PHOS: 99 U/L (ref 38–126)
ALT: 62 U/L — ABNORMAL HIGH (ref 0–44)
AST: 45 U/L — ABNORMAL HIGH (ref 15–41)
BILIRUBIN DIRECT: 0.2 mg/dL (ref 0.0–0.2)
BILIRUBIN TOTAL: 1.2 mg/dL (ref 0.3–1.2)
Indirect Bilirubin: 1 mg/dL — ABNORMAL HIGH (ref 0.3–0.9)
Total Protein: 6.1 g/dL — ABNORMAL LOW (ref 6.5–8.1)

## 2017-07-27 LAB — MAGNESIUM: Magnesium: 1.5 mg/dL — ABNORMAL LOW (ref 1.7–2.4)

## 2017-07-27 LAB — ECHOCARDIOGRAM COMPLETE
HEIGHTINCHES: 68 in
WEIGHTICAEL: 2194.02 [oz_av]

## 2017-07-27 MED ORDER — MAGNESIUM SULFATE 4 GM/100ML IV SOLN
4.0000 g | Freq: Once | INTRAVENOUS | Status: AC
  Administered 2017-07-27: 4 g via INTRAVENOUS
  Filled 2017-07-27: qty 100

## 2017-07-27 MED ORDER — MAGNESIUM OXIDE 400 MG PO CAPS: 400.0000 mg | ORAL_CAPSULE | Freq: Two times a day (BID) | ORAL | 0 refills | Status: AC

## 2017-07-27 MED ORDER — ASPIRIN 325 MG PO TBEC: 325.0000 mg | DELAYED_RELEASE_TABLET | Freq: Every day | ORAL | 0 refills | Status: AC

## 2017-07-27 MED ORDER — CHLORDIAZEPOXIDE HCL 5 MG PO CAPS
25.0000 mg | ORAL_CAPSULE | Freq: Four times a day (QID) | ORAL | Status: DC
  Administered 2017-07-27 (×3): 25 mg via ORAL
  Filled 2017-07-27 (×3): qty 5

## 2017-07-27 MED ORDER — FOLIC ACID 1 MG PO TABS: 1.0000 mg | ORAL_TABLET | Freq: Every day | ORAL | 0 refills | Status: AC

## 2017-07-27 MED ORDER — ENTRESTO 49-51 MG PO TABS: 1.0000 | ORAL_TABLET | Freq: Two times a day (BID) | ORAL | 0 refills | Status: DC

## 2017-07-27 MED ORDER — NICOTINE POLACRILEX 2 MG MT GUM: 2.0000 mg | CHEWING_GUM | OROMUCOSAL | 0 refills | Status: DC | PRN

## 2017-07-27 MED ORDER — CHLORDIAZEPOXIDE HCL 5 MG PO CAPS: 25.0000 mg | ORAL_CAPSULE | Freq: Three times a day (TID) | ORAL | Status: DC

## 2017-07-27 MED ORDER — SACUBITRIL-VALSARTAN 49-51 MG PO TABS
1.0000 | ORAL_TABLET | Freq: Two times a day (BID) | ORAL | Status: DC
  Administered 2017-07-27: 1 via ORAL
  Filled 2017-07-27 (×2): qty 1

## 2017-07-27 MED ORDER — POTASSIUM CHLORIDE CRYS ER 20 MEQ PO TBCR
40.0000 meq | EXTENDED_RELEASE_TABLET | ORAL | Status: AC
  Administered 2017-07-27 (×2): 40 meq via ORAL
  Filled 2017-07-27: qty 2

## 2017-07-27 MED ORDER — PERFLUTREN LIPID MICROSPHERE
1.0000 mL | INTRAVENOUS | Status: AC | PRN
  Administered 2017-07-27: 2 mL via INTRAVENOUS
  Filled 2017-07-27 (×2): qty 10

## 2017-07-27 MED ORDER — NICOTINE POLACRILEX 2 MG MT GUM
2.0000 mg | CHEWING_GUM | OROMUCOSAL | Status: DC | PRN
  Filled 2017-07-27: qty 1

## 2017-07-27 MED ORDER — FUROSEMIDE 40 MG PO TABS: 40.0000 mg | ORAL_TABLET | Freq: Every day | ORAL | 0 refills | Status: DC

## 2017-07-27 MED ORDER — CHLORDIAZEPOXIDE HCL 5 MG PO CAPS: 25.0000 mg | ORAL_CAPSULE | Freq: Every day | ORAL | Status: DC

## 2017-07-27 MED ORDER — NICOTINE 21 MG/24HR TD PT24: 21.0000 mg | MEDICATED_PATCH | Freq: Every day | TRANSDERMAL | 0 refills | Status: DC

## 2017-07-27 MED ORDER — ISOSORB DINITRATE-HYDRALAZINE 20-37.5 MG PO TABS: 1.0000 | ORAL_TABLET | Freq: Three times a day (TID) | ORAL | 0 refills | Status: DC

## 2017-07-27 MED ORDER — SPIRONOLACTONE 25 MG PO TABS: 25.0000 mg | ORAL_TABLET | Freq: Every day | ORAL | 0 refills | Status: DC

## 2017-07-27 MED ORDER — NICOTINE 21 MG/24HR TD PT24
21.0000 mg | MEDICATED_PATCH | Freq: Every day | TRANSDERMAL | Status: DC
  Administered 2017-07-27: 21 mg via TRANSDERMAL
  Filled 2017-07-27: qty 1

## 2017-07-27 MED ORDER — CHLORDIAZEPOXIDE HCL 5 MG PO CAPS: 25.0000 mg | ORAL_CAPSULE | ORAL | Status: DC

## 2017-07-27 NOTE — Care Management Note (Signed)
Case Management Note Donn Pierini RN, BSN Unit 4E-Case Manager [phone]  Patient Details  Name: [name] MRN: [id number] Date of Birth: 1965/09/13  Subjective/Objective:  Pt admitted with SOB, HTN, and CHF                  Action/Plan: PTA pt lived at home, hx polysubstance abuse- and non compliance- CM in to see pt - discussed medication compliance issues- per pt he states he sometimes does not have the money for meds other times it is a transportation issue. Pt [facility] to [facility] for PCP and pharmacy needs. CM discussed home delivery option with pt - pt interested- list provided to pt of pharmacies that deliver in the area. Pt would need to contact pharmacy to set home delivery up.   Expected Discharge Date:                  Expected Discharge Plan:  Home/Self Care  In-House Referral:  Clinical Social Work  Discharge planning Services  CM Consult, Medication Assistance  Post Acute Care Choice:    Choice offered to:     DME Arranged:    DME Agency:     HH Arranged:    HH Agency:     Status of Service:  Completed, signed off  If discussed at [employer] of Stay Meetings, dates discussed:    Discharge Disposition:   Additional Comments:  Darrold Span, RN 07/27/2017, 4:26 PM

## 2017-07-27 NOTE — Progress Notes (Signed)
07/27/2017 6:48 PM Discharge AVS meds taken today and those due this evening reviewed.  Follow-up appointments and when to call md reviewed.  D/C IV and TELE.  Questions and concerns addressed.  Pt given given evening meds per Dr. Betsey Amen request.  D/C home per orders. Kathryne Hitch

## 2017-07-27 NOTE — Discharge Summary (Addendum)
Physician Discharge Summary  [name]:[id number] DOB: 1965-11-21 DOA: 07/26/2017  PCP: Hoy Register, MD  Admit date: 07/26/2017 Discharge date: 07/27/2017  Time spent: 40 minutes  Recommendations for Outpatient Follow-up:  1. Follow up CBC/CMP as well as magnesium as outpatient 2. Ensure follow up with PCP and cardiology 3. Ensure compliance with medications and able to obtain medications  4. Follow up beta blocker, held on discharge given cocaine use.  He notes he's planning to quit and not planning to use anymore.  Consider restarting in follow up.  5. Continue to encourage etoh cessation.  Discussed potential librium taper today, but he notes he's not ready to quit and wants to cut back instead.  Continue to discuss in follow up. 6. Encourage smoking cessation   Discharge Diagnoses:  Principal Problem:   Acute respiratory failure with hypoxia (HCC) Active Problems:   Polysubstance abuse (HCC)   Alcoholic cardiomyopathy (HCC)   Accelerated hypertension   ETOH abuse   Cocaine abuse (HCC)   NICM (nonischemic cardiomyopathy) (HCC)   Acute on chronic combined systolic and diastolic CHF (congestive heart failure) (HCC)   Elevated LFTs   Acute respiratory failure (HCC)   Discharge Condition: stable  Diet recommendation: heart healthy  Filed Weights   07/26/17 0400 07/26/17 0830  Weight: 70.8 kg (156 lb) 62.2 kg (137 lb 2 oz)    History of present illness:  Per HPI 52 year old male with history of polysubstance abuse, CHFrEF, hypertension, who came to the ED due to acute shortness of breath when he woke up this morning.  He was found to have hypertensive emergency in the setting of acute on chronic CHF.  He did feel intermittent worsening dyspnea over the last couple of days but this morning was so severe that he came to the hospital.  He reports nonadherence to his home medications.  He drinks about half [place] gallon of gin every day, and reports history of seizures when  he stopped drinking in the past.  He was admitted for [name] HF exacerbation in the setting of hypertensive emergency.  He was treated with lasix, nitro gtt, bipap and improved.  His home medicines were restarted (except coreg) and he was back to his normal on hospital day 1.  He had Meera Vasco repeat echo that showed EF 20-25% and restrictive filling.  He was discharged with refills of his medications and instructed to f/u with PCP/cardiology.  See below for additional details.  Hospital Course:  #1. Acute respiratory failure with hypoxia secondary to acute on chronic systolic and diastolic heart failure in the setting of hypertensive urgency.  Chest x-ray with cardiac enlargement no evidence of active pulmonary disease.  Initially placed on BiPAP, received IV lasix and nitro gtt.   He'd improved on hospital day 1, feeling back to baseline.  He'd run out of his meds (everything except entresto).  Refills provided.  He notes he'll be able to pick up his medications tomorrow (denies that he'll have problems with cost).  Care management provided him with information about home delivery. Follow up with cardiology as outpatient  #2. Acute on chronic combined systolic and diastolic heart failure. Chest x-ray as noted above. EKG appeared similar to priors, but now ST flattening in V3, no longer T wave inversion in V4). BNP 1872. Echo done in March 2018 reveals an EF of 15%, severe diffuse hypokinesis grade 1 diastolic dysfunction. Home medications include entresto, coreg, lasix bidil spironolactone. He is noncompliant with his home medications (occasionally due to  cost/transportation). -Hold coreg due to cocaine use -Refilled Lasix, bidil, spironolactone and entresto at discharge -Obtain Izaiah Tabb 2-D echo -> EF 20-25%, restrictive filling - follow up with cardiology   #3.HTN urgency. improved - discussed importance of compliance with meds  4.Elevated lft. Improved, likely 2/2 etoh abuse. -monitor -etoh cessation  counseling   5.polysubstance abuse.urine drug screen pending.  - utox with cocaine, THC - discussed importance of cessation and danger of etoh/cocaine with his HF.  - declined cessation resources   6. EtOH abuse. Discussed importance of cessation.  Discussed librium taper, but he states he'll probably moderate his drinking instead.  7. History of CVA. -continue home aspirin  Procedures: Study Conclusions  - Left ventricle: The cavity size was moderately dilated. Wall   thickness was increased in Anacleto Batterman pattern of moderate LVH. Systolic   function was severely reduced. The estimated ejection fraction   was in the range of 20% to 25%. Diffuse hypokinesis. Doppler   parameters are consistent with restrictive physiology, indicative   of decreased left ventricular diastolic compliance and/or   increased left atrial pressure. - Aortic root: The aortic root was mildly dilated.  Impressions:  - Severe global reduction in LV systolic function; restrictive   filling; moderate LVE; moderate LVH; mildly dilated aortic root.  Consultations:  None  Discharge Exam: Vitals:   07/27/17 0823 07/27/17 1429  BP: (!) 152/117 (!) 135/104  Pulse: 82 (!) 110  Resp: 18 18  Temp: 97.8 F (36.6 C) 98.6 F (37 C)  SpO2: 99% 96%   Feels well, wants to go home. Last used drugs on July 4th.  Sx gradually worse since then. Ran out of all meds except entresto.  General: No acute distress. Cardiovascular: Heart sounds show Irfan Veal regular rate, and rhythm. No gallops or rubs. No murmurs. No JVD. Lungs: Clear to auscultation bilaterally with good air movement. No rales, rhonchi or wheezes. Abdomen: Soft, nontender, nondistended  Neurological: Alert and oriented 3. Moves all extremities 4 Cranial nerves II through XII grossly intact. Skin: Warm and dry. No rashes or lesions. Extremities: No clubbing or cyanosis. No edema Psychiatric: Mood and affect are normal. Insight and judgment are  appropriate.   Discharge Instructions   Discharge Instructions    Call MD for:  difficulty breathing, headache or visual disturbances   Complete by:  As directed    Call MD for:  extreme fatigue   Complete by:  As directed    Call MD for:  hives   Complete by:  As directed    Call MD for:  persistant dizziness or light-headedness   Complete by:  As directed    Call MD for:  persistant nausea and vomiting   Complete by:  As directed    Call MD for:  redness, tenderness, or signs of infection (pain, swelling, redness, odor or green/yellow discharge around incision site)   Complete by:  As directed    Call MD for:  severe uncontrolled pain   Complete by:  As directed    Call MD for:  temperature >100.4   Complete by:  As directed    Diet - low sodium heart healthy   Complete by:  As directed    Discharge instructions   Complete by:  As directed    You were seen for shortness of breath.  This was due to Cora Brierley heart failure exacerbation in the setting of high blood pressures and not having your medications.  I've sent refills of your medication into the  pharmacy.  Please pick these up after you leave the hospital.  Do not use your carvedilol (coreg) until you follow up with your PCP or [name] to discuss this further (we avoid this medication when people use cocaine).  Abstinence from alcohol and drug use will be extremely important for your health.    Return for new, recurrent, or worsening symptoms.   Please ask your PCP to request records from this hospitalization so they know what was done and what the next steps will be.   Heart Failure patients record your daily weight using the same scale at the same time of day   Complete by:  As directed    Increase activity slowly   Complete by:  As directed      Allergies as of 07/27/2017      Reactions   Penicillins Other (See Comments)   Blisters  Has patient had Bates Collington PCN reaction causing immediate rash, facial/tongue/throat  swelling, SOB or lightheadedness with hypotension: Yes Has patient had Aneeka Bowden PCN reaction causing severe rash involving mucus membranes or skin necrosis: Yes Has patient had Akeema Broder PCN reaction that required hospitalization: No Has patient had Obe Ahlers PCN reaction occurring within the last 10 years: No If all of the above answers are "NO", then may proceed with Cephalosporin use.      Medication List    STOP taking these medications   carvedilol 12.5 MG tablet Commonly known as:  COREG     TAKE these medications   aspirin 325 MG EC tablet Take 1 tablet (325 mg total) by mouth daily.   cyclobenzaprine 5 MG tablet Commonly known as:  FLEXERIL Take 1 tablet (5 mg total) by mouth 2 (two) times daily as needed for muscle spasms.   ENTRESTO 49-51 MG Generic drug:  sacubitril-valsartan Take 1 tablet by mouth 2 (two) times daily.   folic acid 1 MG tablet Commonly known as:  FOLVITE Take 1 tablet (1 mg total) by mouth daily. Start taking on:  07/28/2017   furosemide 40 MG tablet Commonly known as:  LASIX Take 1 tablet (40 mg total) by mouth daily.   guaiFENesin 600 MG 12 hr tablet Commonly known as:  MUCINEX Take 1 tablet (600 mg total) by mouth 2 (two) times daily. What changed:    when to take this  reasons to take this   HYDROcodone-acetaminophen 5-325 MG tablet Commonly known as:  NORCO/VICODIN Take 1 tablet by mouth every 6 (six) hours as needed for severe pain.   isosorbide-hydrALAZINE 20-37.5 MG tablet Commonly known as:  BIDIL Take 1 tablet by mouth 3 (three) times daily.   Magnesium Oxide 400 MG Caps Take 1 capsule (400 mg total) by mouth 2 (two) times daily.   multivitamin with minerals Tabs tablet Take 1 tablet by mouth daily.   nicotine 21 mg/24hr patch Commonly known as:  NICODERM CQ - dosed in mg/24 hours Place 1 patch (21 mg total) onto the skin daily. Start taking on:  07/28/2017   nicotine polacrilex 2 MG gum Commonly known as:  NICORETTE Take 1 each (2 mg total)  by mouth as needed for smoking cessation.   spironolactone 25 MG tablet Commonly known as:  ALDACTONE Take 1 tablet (25 mg total) by mouth daily.      Allergies  Allergen Reactions  . Penicillins Other (See Comments)    Blisters  Has patient had Nathaly Dawkins PCN reaction causing immediate rash, facial/tongue/throat swelling, SOB or lightheadedness with hypotension: Yes Has patient had Taina Landry PCN  reaction causing severe rash involving mucus membranes or skin necrosis: Yes Has patient had Neah Sporrer PCN reaction that required hospitalization: No Has patient had Elyanah Farino PCN reaction occurring within the last 10 years: No If all of the above answers are "NO", then may proceed with Cephalosporin use.    Follow-up Information    Hoy Register, MD Follow up.   Specialty:  Family Medicine Contact information: [address] [place] [address] [phone]        Chilton Si, MD Follow up.   Specialty:  Cardiology Contact information: [address] [place] [address] [phone]            The results of significant diagnostics from this hospitalization (including imaging, microbiology, ancillary and laboratory) are listed below for reference.    Significant Diagnostic Studies: Dg Chest Portable 1 View  Result Date: 07/26/2017 CLINICAL DATA:  Shortness of breath and diaphoresis upon waking up. EXAM: PORTABLE CHEST 1 VIEW COMPARISON:  12/22/2016 FINDINGS: Cardiac enlargement. No pulmonary vascular congestion, edema, or consolidation. No blunting of costophrenic angles. No pneumothorax. Mediastinal contours appear intact. IMPRESSION: Cardiac enlargement.  No evidence of active pulmonary disease. Electronically Signed   By: Burman Nieves M.D.   On: 07/26/2017 04:24    Microbiology: No results found for this or any previous visit (from the past 240 hour(s)).   Labs: Basic Metabolic Panel: Recent Labs  Lab 07/26/17 0402 07/27/17 0319  NA 142 138  K 4.4 3.0*  CL  113* 100  CO2 23 29  GLUCOSE 107* 125*  BUN 10 7  CREATININE 1.04 1.03  CALCIUM 9.2 9.5  MG  --  1.5*   Liver Function Tests: Recent Labs  Lab 07/26/17 0402 07/27/17 0319  AST 171* 45*  ALT 91* 62*  ALKPHOS 128* 99  BILITOT 0.7 1.2  PROT 6.6 6.1*  ALBUMIN 3.9 3.4*   No results for input(s): LIPASE, AMYLASE in the last 168 hours. No results for input(s): AMMONIA in the last 168 hours. CBC: Recent Labs  Lab 07/26/17 0402  WBC 6.5  NEUTROABS 4.7  HGB 13.3  HCT 40.1  MCV 103.1*  PLT 166   Cardiac Enzymes: Recent Labs  Lab 07/26/17 0402  TROPONINI 0.03*   BNP: BNP (last 3 results) Recent Labs    12/16/16 0733 12/22/16 0508 07/26/17 0402  BNP 1,002.7* 1,096.9* 1,872.8*    ProBNP (last 3 results) No results for input(s): PROBNP in the last 8760 hours.  CBG: No results for input(s): GLUCAP in the last 168 hours.     Signed:  Lacretia Nicks MD.  [facility] 07/27/2017, 5:44 PM

## 2017-07-27 NOTE — Progress Notes (Signed)
  Echocardiogram 2D Echocardiogram has been performed.  [name] 07/27/2017, 11:31 AM

## 2017-07-28 DIAGNOSIS — I63511 Cerebral infarction due to unspecified occlusion or stenosis of right middle cerebral artery: Secondary | ICD-10-CM | POA: Diagnosis not present

## 2017-07-29 ENCOUNTER — Telehealth: Payer: Self-pay

## 2017-07-29 ENCOUNTER — Telehealth: Payer: Self-pay | Admitting: Cardiovascular Disease

## 2017-07-29 DIAGNOSIS — I63511 Cerebral infarction due to unspecified occlusion or stenosis of right middle cerebral artery: Secondary | ICD-10-CM | POA: Diagnosis not present

## 2017-07-29 NOTE — Telephone Encounter (Signed)
New Message    Pt c/o medication issue:  1. Name of Medication: Entresto   2. How are you currently taking this medication (dosage and times per day)? 49-51MG  1 tablet by mouth two times a day   3. Are you having a reaction (difficulty breathing--STAT)?  4. What is your medication issue? [name] with [facility] is calling to advise that prior Berkley Harvey is needed for the Meta per [facility] [place], [place] - [place] [address] [employer]

## 2017-07-29 NOTE — Telephone Encounter (Signed)
Call received from Johney Frame, RN/P4CC.  She stated that completed a discharge call to the patient. She said that he was coughing and was having difficulty speaking at times..  He does not have any of his discharge medications and he had not checked to confirm if they were ready for pick up at the pharmacy. He was concerned that he did not have a prescription for hydrocodone and he said that he was not given a paper prescription.  As per [facility] Hammer,RPH/[facility], the medications are ready for pick up except for the entresto. Prior authorization is needed from cardiology/medicaid. Nicole Cella stated that she would contact cardiology about the need for the prior auth. She also said that she would inform the patient that his medications are ready for pick up and he needs to pick them up and start taking them  as soon as possible.  He did not have a hospital follow up appointment scheduled and an appointment was scheduled for 08/05/17 @ 0950. Nicole Cella said that she would contact medicaid and register him for transportation and schedule transportation to the clinic for 08/05/17.

## 2017-07-29 NOTE — Telephone Encounter (Signed)
He will need to pick up his medications as soon as possible as lack of the Lasix could result in dyspnea.  I will see him at his upcoming appointment.

## 2017-07-30 ENCOUNTER — Encounter (HOSPITAL_COMMUNITY): Payer: Self-pay | Admitting: Emergency Medicine

## 2017-07-30 ENCOUNTER — Emergency Department (HOSPITAL_COMMUNITY): Payer: Medicaid Other

## 2017-07-30 ENCOUNTER — Inpatient Hospital Stay (HOSPITAL_COMMUNITY)
Admission: EM | Admit: 2017-07-30 | Discharge: 2017-08-01 | DRG: 208 | Disposition: A | Discharge status: Home / Self Care | Payer: Medicaid Other | Attending: Family Medicine | Admitting: Family Medicine

## 2017-07-30 ENCOUNTER — Other Ambulatory Visit: Payer: Self-pay

## 2017-07-30 DIAGNOSIS — Z8349 Family history of other endocrine, nutritional and metabolic diseases: Secondary | ICD-10-CM | POA: Diagnosis not present

## 2017-07-30 DIAGNOSIS — F1010 Alcohol abuse, uncomplicated: Secondary | ICD-10-CM

## 2017-07-30 DIAGNOSIS — J9602 Acute respiratory failure with hypercapnia: Secondary | ICD-10-CM | POA: Diagnosis not present

## 2017-07-30 DIAGNOSIS — I5042 Chronic combined systolic (congestive) and diastolic (congestive) heart failure: Secondary | ICD-10-CM | POA: Diagnosis present

## 2017-07-30 DIAGNOSIS — G4733 Obstructive sleep apnea (adult) (pediatric): Secondary | ICD-10-CM | POA: Diagnosis present

## 2017-07-30 DIAGNOSIS — I161 Hypertensive emergency: Secondary | ICD-10-CM | POA: Diagnosis present

## 2017-07-30 DIAGNOSIS — Z7982 Long term (current) use of aspirin: Secondary | ICD-10-CM

## 2017-07-30 DIAGNOSIS — I10 Essential (primary) hypertension: Secondary | ICD-10-CM

## 2017-07-30 DIAGNOSIS — R0603 Acute respiratory distress: Secondary | ICD-10-CM | POA: Diagnosis not present

## 2017-07-30 DIAGNOSIS — N179 Acute kidney failure, unspecified: Secondary | ICD-10-CM

## 2017-07-30 DIAGNOSIS — I426 Alcoholic cardiomyopathy: Secondary | ICD-10-CM | POA: Diagnosis present

## 2017-07-30 DIAGNOSIS — D696 Thrombocytopenia, unspecified: Secondary | ICD-10-CM | POA: Diagnosis present

## 2017-07-30 DIAGNOSIS — I428 Other cardiomyopathies: Secondary | ICD-10-CM

## 2017-07-30 DIAGNOSIS — F1910 Other psychoactive substance abuse, uncomplicated: Secondary | ICD-10-CM | POA: Diagnosis present

## 2017-07-30 DIAGNOSIS — S0003XA Contusion of scalp, initial encounter: Secondary | ICD-10-CM | POA: Diagnosis not present

## 2017-07-30 DIAGNOSIS — Z8249 Family history of ischemic heart disease and other diseases of the circulatory system: Secondary | ICD-10-CM

## 2017-07-30 DIAGNOSIS — F17210 Nicotine dependence, cigarettes, uncomplicated: Secondary | ICD-10-CM | POA: Diagnosis present

## 2017-07-30 DIAGNOSIS — R402441 Other coma, without documented Glasgow coma scale score, or with partial score reported, in the field [EMT or ambulance]: Secondary | ICD-10-CM | POA: Diagnosis not present

## 2017-07-30 DIAGNOSIS — R402252 Coma scale, best verbal response, oriented, at arrival to emergency department: Secondary | ICD-10-CM | POA: Diagnosis present

## 2017-07-30 DIAGNOSIS — Z823 Family history of stroke: Secondary | ICD-10-CM

## 2017-07-30 DIAGNOSIS — E872 Acidosis: Secondary | ICD-10-CM | POA: Diagnosis present

## 2017-07-30 DIAGNOSIS — R000 Tachycardia, unspecified: Secondary | ICD-10-CM | POA: Diagnosis not present

## 2017-07-30 DIAGNOSIS — J9691 Respiratory failure, unspecified with hypoxia: Secondary | ICD-10-CM | POA: Diagnosis present

## 2017-07-30 DIAGNOSIS — F1410 Cocaine abuse, uncomplicated: Secondary | ICD-10-CM | POA: Diagnosis not present

## 2017-07-30 DIAGNOSIS — R0689 Other abnormalities of breathing: Secondary | ICD-10-CM | POA: Diagnosis not present

## 2017-07-30 DIAGNOSIS — R4020 Unspecified coma: Secondary | ICD-10-CM | POA: Diagnosis not present

## 2017-07-30 DIAGNOSIS — R0602 Shortness of breath: Secondary | ICD-10-CM | POA: Diagnosis not present

## 2017-07-30 DIAGNOSIS — I110 Hypertensive heart disease with heart failure: Secondary | ICD-10-CM | POA: Diagnosis present

## 2017-07-30 DIAGNOSIS — Z880 Allergy status to penicillin: Secondary | ICD-10-CM | POA: Diagnosis not present

## 2017-07-30 DIAGNOSIS — R402142 Coma scale, eyes open, spontaneous, at arrival to emergency department: Secondary | ICD-10-CM | POA: Diagnosis present

## 2017-07-30 DIAGNOSIS — J9600 Acute respiratory failure, unspecified whether with hypoxia or hypercapnia: Secondary | ICD-10-CM

## 2017-07-30 DIAGNOSIS — R402362 Coma scale, best motor response, obeys commands, at arrival to emergency department: Secondary | ICD-10-CM | POA: Diagnosis present

## 2017-07-30 DIAGNOSIS — J9601 Acute respiratory failure with hypoxia: Principal | ICD-10-CM

## 2017-07-30 DIAGNOSIS — N319 Neuromuscular dysfunction of bladder, unspecified: Secondary | ICD-10-CM | POA: Diagnosis present

## 2017-07-30 DIAGNOSIS — Z4682 Encounter for fitting and adjustment of non-vascular catheter: Secondary | ICD-10-CM | POA: Diagnosis not present

## 2017-07-30 DIAGNOSIS — I69322 Dysarthria following cerebral infarction: Secondary | ICD-10-CM | POA: Diagnosis not present

## 2017-07-30 DIAGNOSIS — F10229 Alcohol dependence with intoxication, unspecified: Secondary | ICD-10-CM | POA: Diagnosis present

## 2017-07-30 DIAGNOSIS — R069 Unspecified abnormalities of breathing: Secondary | ICD-10-CM | POA: Diagnosis not present

## 2017-07-30 DIAGNOSIS — J9690 Respiratory failure, unspecified, unspecified whether with hypoxia or hypercapnia: Secondary | ICD-10-CM | POA: Diagnosis not present

## 2017-07-30 DIAGNOSIS — R4182 Altered mental status, unspecified: Secondary | ICD-10-CM | POA: Diagnosis present

## 2017-07-30 DIAGNOSIS — I63511 Cerebral infarction due to unspecified occlusion or stenosis of right middle cerebral artery: Secondary | ICD-10-CM | POA: Diagnosis not present

## 2017-07-30 LAB — I-STAT ARTERIAL BLOOD GAS, ED
ACID-BASE DEFICIT: 5 mmol/L — AB (ref 0.0–2.0)
BICARBONATE: 22.5 mmol/L (ref 20.0–28.0)
O2 SAT: 100 %
Patient temperature: 98.6
TCO2: 24 mmol/L (ref 22–32)
pCO2 arterial: 53.2 mmHg — ABNORMAL HIGH (ref 32.0–48.0)
pH, Arterial: 7.235 — ABNORMAL LOW (ref 7.350–7.450)
pO2, Arterial: 556 mmHg — ABNORMAL HIGH (ref 83.0–108.0)

## 2017-07-30 LAB — COMPREHENSIVE METABOLIC PANEL
ALBUMIN: 4.1 g/dL (ref 3.5–5.0)
ALK PHOS: 89 U/L (ref 38–126)
ALT: 47 U/L — ABNORMAL HIGH (ref 0–44)
AST: 48 U/L — AB (ref 15–41)
Anion gap: 15 (ref 5–15)
BILIRUBIN TOTAL: 0.6 mg/dL (ref 0.3–1.2)
BUN: 17 mg/dL (ref 6–20)
CALCIUM: 9.8 mg/dL (ref 8.9–10.3)
CO2: 20 mmol/L — ABNORMAL LOW (ref 22–32)
Chloride: 104 mmol/L (ref 98–111)
Creatinine, Ser: 1.26 mg/dL — ABNORMAL HIGH (ref 0.61–1.24)
GFR calc Af Amer: >60 mL/min (ref >60)
GFR calc non Af Amer: >60 mL/min (ref >60)
GLUCOSE: 77 mg/dL (ref 70–99)
POTASSIUM: 4 mmol/L (ref 3.5–5.1)
Sodium: 139 mmol/L (ref 135–145)
TOTAL PROTEIN: 6.8 g/dL (ref 6.5–8.1)

## 2017-07-30 LAB — MAGNESIUM: Magnesium: 1.6 mg/dL — ABNORMAL LOW (ref 1.7–2.4)

## 2017-07-30 LAB — CBC WITH DIFFERENTIAL/PLATELET
ABS IMMATURE GRANULOCYTES: 0 10*3/uL (ref 0.0–0.1)
BASOS ABS: 0.1 10*3/uL (ref 0.0–0.1)
BASOS PCT: 1 %
EOS ABS: 0.1 10*3/uL (ref 0.0–0.7)
EOS PCT: 1 %
HCT: 42 % (ref 39.0–52.0)
HEMOGLOBIN: 14.2 g/dL (ref 13.0–17.0)
Immature Granulocytes: 0 %
Lymphocytes Relative: 22 %
Lymphs Abs: 2 10*3/uL (ref 0.7–4.0)
MCH: 34.4 pg — AB (ref 26.0–34.0)
MCHC: 33.8 g/dL (ref 30.0–36.0)
MCV: 101.7 fL — AB (ref 78.0–100.0)
MONO ABS: 1.1 10*3/uL — AB (ref 0.1–1.0)
Monocytes Relative: 12 %
Neutro Abs: 5.7 10*3/uL (ref 1.7–7.7)
Neutrophils Relative %: 64 %
PLATELETS: 184 10*3/uL (ref 150–400)
RBC: 4.13 MIL/uL — ABNORMAL LOW (ref 4.22–5.81)
RDW: 13.3 % (ref 11.5–15.5)
WBC: 8.9 10*3/uL (ref 4.0–10.5)

## 2017-07-30 LAB — URINALYSIS, COMPLETE (UACMP) WITH MICROSCOPIC
Bilirubin Urine: NEGATIVE
GLUCOSE, UA: NEGATIVE mg/dL
KETONES UR: NEGATIVE mg/dL
Leukocytes, UA: NEGATIVE
Nitrite: NEGATIVE
PROTEIN: NEGATIVE mg/dL
Specific Gravity, Urine: 1.018 (ref 1.005–1.030)
pH: 5 (ref 5.0–8.0)

## 2017-07-30 LAB — POCT I-STAT 3, ART BLOOD GAS (G3+)
Acid-base deficit: 4 mmol/L — ABNORMAL HIGH (ref 0.0–2.0)
BICARBONATE: 19.9 mmol/L — AB (ref 20.0–28.0)
O2 SAT: 100 %
PCO2 ART: 31.1 mmHg — AB (ref 32.0–48.0)
PO2 ART: 164 mmHg — AB (ref 83.0–108.0)
TCO2: 21 mmol/L — AB (ref 22–32)
pH, Arterial: 7.415 (ref 7.350–7.450)

## 2017-07-30 LAB — RAPID URINE DRUG SCREEN, HOSP PERFORMED
AMPHETAMINES: NOT DETECTED
BENZODIAZEPINES: POSITIVE — AB
COCAINE: POSITIVE — AB
Opiates: NOT DETECTED
Tetrahydrocannabinol: POSITIVE — AB

## 2017-07-30 LAB — GLUCOSE, CAPILLARY
GLUCOSE-CAPILLARY: 97 mg/dL (ref 70–99)
Glucose-Capillary: 61 mg/dL — ABNORMAL LOW (ref 70–99)
Glucose-Capillary: 72 mg/dL (ref 70–99)
Glucose-Capillary: 93 mg/dL (ref 70–99)

## 2017-07-30 LAB — ETHANOL: Alcohol, Ethyl (B): 172 mg/dL — ABNORMAL HIGH (ref <10)

## 2017-07-30 LAB — TROPONIN I
TROPONIN I: 0.03 ng/mL — AB (ref <0.03)
TROPONIN I: 0.04 ng/mL — AB (ref <0.03)
Troponin I: 0.04 ng/mL (ref <0.03)

## 2017-07-30 LAB — BASIC METABOLIC PANEL
ANION GAP: 16 — AB (ref 5–15)
BUN: 16 mg/dL (ref 6–20)
CALCIUM: 8.9 mg/dL (ref 8.9–10.3)
CO2: 19 mmol/L — AB (ref 22–32)
Chloride: 107 mmol/L (ref 98–111)
Creatinine, Ser: 1.11 mg/dL (ref 0.61–1.24)
GLUCOSE: 74 mg/dL (ref 70–99)
POTASSIUM: 3.7 mmol/L (ref 3.5–5.1)
Sodium: 142 mmol/L (ref 135–145)

## 2017-07-30 LAB — PHOSPHORUS: PHOSPHORUS: 4.5 mg/dL (ref 2.5–4.6)

## 2017-07-30 LAB — CBG MONITORING, ED: GLUCOSE-CAPILLARY: 78 mg/dL (ref 70–99)

## 2017-07-30 LAB — SALICYLATE LEVEL: Salicylate Lvl: <7 mg/dL (ref 2.8–30.0)

## 2017-07-30 LAB — CBC
HCT: 40.5 % (ref 39.0–52.0)
Hemoglobin: 13.3 g/dL (ref 13.0–17.0)
MCH: 34.2 pg — ABNORMAL HIGH (ref 26.0–34.0)
MCHC: 32.8 g/dL (ref 30.0–36.0)
MCV: 104.1 fL — ABNORMAL HIGH (ref 78.0–100.0)
Platelets: 147 10*3/uL — ABNORMAL LOW (ref 150–400)
RBC: 3.89 MIL/uL — AB (ref 4.22–5.81)
RDW: 13.7 % (ref 11.5–15.5)
WBC: 6.4 10*3/uL (ref 4.0–10.5)

## 2017-07-30 LAB — AMMONIA: Ammonia: 28 umol/L (ref 9–35)

## 2017-07-30 LAB — MRSA PCR SCREENING: MRSA BY PCR: NEGATIVE

## 2017-07-30 LAB — ACETAMINOPHEN LEVEL

## 2017-07-30 LAB — BRAIN NATRIURETIC PEPTIDE: B Natriuretic Peptide: 381.3 pg/mL — ABNORMAL HIGH (ref 0.0–100.0)

## 2017-07-30 MED ORDER — FOLIC ACID 5 MG/ML IJ SOLN
1.0000 mg | Freq: Every day | INTRAMUSCULAR | Status: DC
  Administered 2017-07-30 – 2017-07-31 (×2): 1 mg via INTRAVENOUS
  Filled 2017-07-30 (×2): qty 0.2

## 2017-07-30 MED ORDER — FENTANYL CITRATE (PF) 100 MCG/2ML IJ SOLN
50.0000 ug | Freq: Once | INTRAMUSCULAR | Status: AC
  Administered 2017-07-30: 50 ug via INTRAVENOUS
  Filled 2017-07-30: qty 2

## 2017-07-30 MED ORDER — DEXTROSE 50 % IV SOLN
INTRAVENOUS | Status: AC
  Administered 2017-07-30: 50 mL
  Filled 2017-07-30: qty 50

## 2017-07-30 MED ORDER — CHLORHEXIDINE GLUCONATE 0.12% ORAL RINSE (MEDLINE KIT)
15.0000 mL | Freq: Two times a day (BID) | OROMUCOSAL | Status: DC
  Administered 2017-07-30: 15 mL via OROMUCOSAL

## 2017-07-30 MED ORDER — ETOMIDATE 2 MG/ML IV SOLN
INTRAVENOUS | Status: AC | PRN
  Administered 2017-07-30: 20 mg via INTRAVENOUS

## 2017-07-30 MED ORDER — FAMOTIDINE IN NACL 20-0.9 MG/50ML-% IV SOLN
20.0000 mg | Freq: Two times a day (BID) | INTRAVENOUS | Status: DC
  Administered 2017-07-30 – 2017-07-31 (×3): 20 mg via INTRAVENOUS
  Filled 2017-07-30 (×4): qty 50

## 2017-07-30 MED ORDER — FENTANYL 2500MCG IN NS 250ML (10MCG/ML) PREMIX INFUSION
25.0000 ug/h | INTRAVENOUS | Status: DC
  Administered 2017-07-30: 50 ug/h via INTRAVENOUS
  Filled 2017-07-30: qty 250

## 2017-07-30 MED ORDER — MAGNESIUM SULFATE 2 GM/50ML IV SOLN
2.0000 g | Freq: Once | INTRAVENOUS | Status: AC
  Administered 2017-07-30: 2 g via INTRAVENOUS
  Filled 2017-07-30: qty 50

## 2017-07-30 MED ORDER — HYDRALAZINE HCL 20 MG/ML IJ SOLN: 10.0000 mg | INTRAMUSCULAR | Status: DC | PRN

## 2017-07-30 MED ORDER — MIDAZOLAM HCL 2 MG/2ML IJ SOLN
INTRAMUSCULAR | Status: AC
  Administered 2017-07-30: 03:00:00
  Filled 2017-07-30: qty 2

## 2017-07-30 MED ORDER — ORAL CARE MOUTH RINSE
15.0000 mL | Freq: Two times a day (BID) | OROMUCOSAL | Status: DC
  Administered 2017-07-30 – 2017-08-01 (×5): 15 mL via OROMUCOSAL

## 2017-07-30 MED ORDER — INSULIN ASPART 100 UNIT/ML ~~LOC~~ SOLN: 1.0000 [IU] | SUBCUTANEOUS | Status: DC

## 2017-07-30 MED ORDER — MIDAZOLAM HCL 2 MG/2ML IJ SOLN: 1.0000 mg | INTRAMUSCULAR | Status: DC | PRN

## 2017-07-30 MED ORDER — SODIUM CHLORIDE 0.9 % IV SOLN
250.0000 mL | INTRAVENOUS | Status: DC | PRN
  Administered 2017-07-30: 10 mL via INTRAVENOUS
  Administered 2017-07-30: 250 mL via INTRAVENOUS
  Administered 2017-07-30: 10 mL via INTRAVENOUS

## 2017-07-30 MED ORDER — ORAL CARE MOUTH RINSE: 15.0000 mL | OROMUCOSAL | Status: DC

## 2017-07-30 MED ORDER — MIDAZOLAM HCL 2 MG/2ML IJ SOLN
2.0000 mg | INTRAMUSCULAR | Status: DC | PRN
  Administered 2017-07-30: 2 mg via INTRAVENOUS
  Filled 2017-07-30: qty 2

## 2017-07-30 MED ORDER — FENTANYL CITRATE (PF) 100 MCG/2ML IJ SOLN
INTRAMUSCULAR | Status: AC
  Administered 2017-07-30: 03:00:00
  Filled 2017-07-30: qty 2

## 2017-07-30 MED ORDER — PROPOFOL 1000 MG/100ML IV EMUL
INTRAVENOUS | Status: AC
  Administered 2017-07-30: 10 ug/kg/min
  Filled 2017-07-30: qty 100

## 2017-07-30 MED ORDER — ASPIRIN 325 MG PO TABS
325.0000 mg | ORAL_TABLET | Freq: Every day | ORAL | Status: DC
  Administered 2017-07-30 – 2017-08-01 (×3): 325 mg
  Filled 2017-07-30 (×3): qty 1

## 2017-07-30 MED ORDER — ADULT MULTIVITAMIN LIQUID CH
15.0000 mL | Freq: Every day | ORAL | Status: DC
  Administered 2017-07-30 – 2017-08-01 (×3): 15 mL
  Filled 2017-07-30 (×3): qty 15

## 2017-07-30 MED ORDER — MIDAZOLAM HCL 2 MG/2ML IJ SOLN
2.0000 mg | INTRAMUSCULAR | Status: DC | PRN
  Administered 2017-07-30: 2 mg via INTRAVENOUS

## 2017-07-30 MED ORDER — THIAMINE HCL 100 MG/ML IJ SOLN
100.0000 mg | Freq: Every day | INTRAMUSCULAR | Status: DC
  Administered 2017-07-30 – 2017-07-31 (×2): 100 mg via INTRAVENOUS
  Filled 2017-07-30 (×2): qty 2

## 2017-07-30 MED ORDER — SUCCINYLCHOLINE CHLORIDE 20 MG/ML IJ SOLN
INTRAMUSCULAR | Status: AC | PRN
  Administered 2017-07-30: 150 mg via INTRAVENOUS

## 2017-07-30 MED ORDER — FENTANYL BOLUS VIA INFUSION
50.0000 ug | INTRAVENOUS | Status: DC | PRN
  Administered 2017-07-30 (×3): 50 ug via INTRAVENOUS
  Filled 2017-07-30: qty 50

## 2017-07-30 MED ORDER — HEPARIN SODIUM (PORCINE) 5000 UNIT/ML IJ SOLN
5000.0000 [IU] | Freq: Three times a day (TID) | INTRAMUSCULAR | Status: DC
  Administered 2017-07-30 – 2017-08-01 (×8): 5000 [IU] via SUBCUTANEOUS
  Filled 2017-07-30 (×8): qty 1

## 2017-07-30 MED ORDER — DEXTROSE 10 % IV SOLN
INTRAVENOUS | Status: DC
  Administered 2017-07-30 – 2017-08-01 (×3): via INTRAVENOUS

## 2017-07-30 MED ORDER — MIDAZOLAM HCL 2 MG/2ML IJ SOLN
2.0000 mg | Freq: Once | INTRAMUSCULAR | Status: AC
  Administered 2017-07-30: 2 mg via INTRAVENOUS
  Filled 2017-07-30: qty 2
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
## 2017-07-30 NOTE — Progress Notes (Signed)
Pt arrived from ED, orally intubated, fentanyl drip @ 151mcg/hr. Pt agitated upon transfer from stretcher to bed, bilateral soft wrist restraints and mittens intact, 2 mg versed IVP given.

## 2017-07-30 NOTE — Progress Notes (Signed)
Patient transported on vent from ED to 2M-05 without complication. 

## 2017-07-30 NOTE — Progress Notes (Signed)
MD aware of pt.'s decreased UOP (about 19ml/hr) with orders given to take out foley catheter.

## 2017-07-30 NOTE — Procedures (Signed)
Extubation Procedure Note  Patient Details:   Name: [name] DOB: 22-May-1965 MRN: [id number]   Airway Documentation:    Vent end date: 07/30/17 Vent end time: 1140   Evaluation  O2 sats: stable throughout Complications: No apparent complications Patient did tolerate procedure well. Bilateral Breath Sounds: Clear   Yes   Pt extubated to 3L [place] per MD order. Pt stable throughout with no complications. Pt had positive cuff leak prior to extubation. VS within normal limits. Pt has strong productive cough post extubation and encouraged to use [place]auer to clear secretions. Pt able to speak and no stridor noted. RT will continue to monitor.   Carolan Shiver 07/30/2017, 12:26 PM

## 2017-07-30 NOTE — Progress Notes (Signed)
Wasted of fentanyl in the sink with Montine Circle, RN

## 2017-07-30 NOTE — Progress Notes (Signed)
51yoM with CHF, CVA, HTN, Polysubstance abuse, admit with respiratory distress and N/V, intubated in ED.  Lungs CTA on exam and CXR clear.   - UDS positive for cocaine and THC  Acute respiratory failure-  He was agitated this morning, but calm down with fentanyl and low-dose Versed.  He was following commands, spontaneous breathing trial was undertaken and after he was tolerating pressure support 5/5 he was extubated to nasal cannula  Magnesium was repleted. Family including his son and mother-are updated at the bedside.  Does not appear fluid overloaded. Unclear if distress was due to cocaine induced bronchospasm vs flash pulm edema from HTN emergency vs aspiration pneumonitis. We will repeat chest x-ray in a.m. Avoid beta-blockade, use hydralazine as needed for hypertension  He was placed on D10 drip for hypoglycemia, diet will be advanced as he improves  Additional critical care time x 30 mins  Gwendolyn Nishi V. Vassie Loll MD

## 2017-07-30 NOTE — Telephone Encounter (Signed)
Patient currently admitted. Will work on PA and have Dr [name] sign when back in office next week

## 2017-07-30 NOTE — Progress Notes (Signed)
ABG results obtained on patient on ventilator settings of tidal volume of 550, respiratory rate of 24, FIO2 of 40%, and PEEP of 5.  No further changes at this time.  Will continue to monitor.    Ref. Range 07/30/2017 07:53  Sample type Unknown ARTERIAL  pH, Arterial Latest Ref Range: 7.350 - 7.450  7.415  pCO2 arterial Latest Ref Range: 32.0 - 48.0 mmHg 31.1 (L)  pO2, Arterial Latest Ref Range: 83.0 - 108.0 mmHg 164.0 (H)  TCO2 Latest Ref Range: 22 - 32 mmol/L 21 (L)  Acid-base deficit Latest Ref Range: 0.0 - 2.0 mmol/L 4.0 (H)  Bicarbonate Latest Ref Range: 20.0 - 28.0 mmol/L 19.9 (L)  O2 Saturation Latest Units: % 100.0  Patient temperature Unknown HIDE  Collection site Unknown RADIAL, ALLEN'S T.Marland KitchenMarland Kitchen

## 2017-07-30 NOTE — Code Documentation (Signed)
Pt to CT with RT, RN, and EMT

## 2017-07-30 NOTE — H&P (Addendum)
PULMONARY / CRITICAL CARE MEDICINE   Name: [name] MRN: [id number] DOB: 1965/06/27    ADMISSION DATE:  07/30/2017 CONSULTATION DATE:  07/30/17  REFERRING MD:  Bebe Shaggy  CHIEF COMPLAINT:  SOB  HISTORY OF PRESENT ILLNESS:  Pt is encephelopathic; therefore, this HPI is obtained from chart review. [name] is a 52 y.o. male with PMH as outlined below. He was brought to [facility] ED early AM 7/11 with respiratory distress.  CPAP was attempted by EMS but pt did not tolerated.  He required BVM and on arrival to ED, was intubated due to increased WOB.  In ED, CT head was negative.  He initially had hypertension but after intubation, had borderline hypotension.  UDS from prior admission cocaine and THC.   PAST MEDICAL HISTORY :  He  has a past medical history of CHF (congestive heart failure) (HCC), Headache, Hypertension, and Stroke (HCC) (02/24/2015).  PAST SURGICAL HISTORY: He  has a past surgical history that includes No past surgeries; head surgery; and left leg surgery.  Allergies  Allergen Reactions  . Penicillins Other (See Comments)    Blisters  Has patient had a PCN reaction causing immediate rash, facial/tongue/throat swelling, SOB or lightheadedness with hypotension: Yes Has patient had a PCN reaction causing severe rash involving mucus membranes or skin necrosis: Yes Has patient had a PCN reaction that required hospitalization: No Has patient had a PCN reaction occurring within the last 10 years: No If all of the above answers are "NO", then may proceed with Cephalosporin use.     No current facility-administered medications on file prior to encounter.    Current Outpatient Medications on File Prior to Encounter  Medication Sig  . aspirin 325 MG EC tablet Take 1 tablet (325 mg total) by mouth daily.  . cyclobenzaprine (FLEXERIL) 5 MG tablet Take 1 tablet (5 mg total) by mouth 2 (two) times daily as needed for muscle spasms.  Marland Kitchen ENTRESTO 49-51 MG Take 1 tablet by  mouth 2 (two) times daily.  . folic acid (FOLVITE) 1 MG tablet Take 1 tablet (1 mg total) by mouth daily.  . furosemide (LASIX) 40 MG tablet Take 1 tablet (40 mg total) by mouth daily.  Marland Kitchen guaiFENesin (MUCINEX) 600 MG 12 hr tablet Take 1 tablet (600 mg total) by mouth 2 (two) times daily. (Patient taking differently: Take 600 mg by mouth 2 (two) times daily as needed for cough or to loosen phlegm. )  . HYDROcodone-acetaminophen (NORCO/VICODIN) 5-325 MG tablet Take 1 tablet by mouth every 6 (six) hours as needed for severe pain.  . isosorbide-hydrALAZINE (BIDIL) 20-37.5 MG tablet Take 1 tablet by mouth 3 (three) times daily.  . Magnesium Oxide 400 MG CAPS Take 1 capsule (400 mg total) by mouth 2 (two) times daily.  . Multiple Vitamin (MULTIVITAMIN WITH MINERALS) TABS tablet Take 1 tablet by mouth daily.  . nicotine (NICODERM CQ - DOSED IN MG/24 HOURS) 21 mg/24hr patch Place 1 patch (21 mg total) onto the skin daily.  . nicotine polacrilex (NICORETTE) 2 MG gum Take 1 each (2 mg total) by mouth as needed for smoking cessation.  Marland Kitchen spironolactone (ALDACTONE) 25 MG tablet Take 1 tablet (25 mg total) by mouth daily.    FAMILY HISTORY:  His indicated that his mother is alive. He indicated that his father is alive. He indicated that his sister is alive. He indicated that his brother is alive. He indicated that his maternal aunt is alive.   SOCIAL HISTORY: He  reports that he has been smoking cigarettes.  He has a 20.00 pack-year smoking history. He has never used smokeless tobacco. He reports that he drinks about 0.6 oz of alcohol per week. He reports that he has current or past drug history. Drug: Cocaine.  REVIEW OF SYSTEMS:  Unable to obtain as pt is encephalopathic.  SUBJECTIVE:  On vent, sedated.  VITAL SIGNS: BP 105/76   Pulse 80   Temp 97.7 F (36.5 C)   Resp (!) 22   SpO2 100%   HEMODYNAMICS:    VENTILATOR SETTINGS:    INTAKE / OUTPUT: No intake/output data  recorded.   PHYSICAL EXAMINATION: General: Adult male, resting in bed, in NAD. Neuro: Sedated, withdraws to noxious stimuli. HEENT: Chambers/AT. Sclerae anicteric. Cardiovascular: RRR, no M/R/G.  Lungs: Respirations even and unlabored.  CTA bilaterally, No W/R/R. Abdomen: BS x 4, soft, NT/ND.  Musculoskeletal: No gross deformities, no edema.  Skin: Intact, warm, no rashes.  LABS:  BMET Recent Labs  Lab 07/26/17 0402 07/27/17 0319 07/30/17 0246  NA 142 138 139  K 4.4 3.0* 4.0  CL 113* 100 104  CO2 23 29 20*  BUN 10 7 17   CREATININE 1.04 1.03 1.26*  GLUCOSE 107* 125* 77    Electrolytes Recent Labs  Lab 07/26/17 0402 07/27/17 0319 07/30/17 0246  CALCIUM 9.2 9.5 9.8  MG  --  1.5*  --     CBC Recent Labs  Lab 07/26/17 0402 07/30/17 0246  WBC 6.5 8.9  HGB 13.3 14.2  HCT 40.1 42.0  PLT 166 184    Coag's No results for input(s): APTT, INR in the last 168 hours.  Sepsis Markers No results for input(s): LATICACIDVEN, PROCALCITON, O2SATVEN in the last 168 hours.  ABG No results for input(s): PHART, PCO2ART, PO2ART in the last 168 hours.  Liver Enzymes Recent Labs  Lab 07/26/17 0402 07/27/17 0319 07/30/17 0246  AST 171* 45* 48*  ALT 91* 62* 47*  ALKPHOS 128* 99 89  BILITOT 0.7 1.2 0.6  ALBUMIN 3.9 3.4* 4.1    Cardiac Enzymes Recent Labs  Lab 07/26/17 0402 07/30/17 0246  TROPONINI 0.03* 0.03*    Glucose Recent Labs  Lab 07/30/17 0236  GLUCAP 78    Imaging Ct Head Wo Contrast  Result Date: 07/30/2017 CLINICAL DATA:  Altered level of consciousness. EXAM: CT HEAD WITHOUT CONTRAST TECHNIQUE: Contiguous axial images were obtained from the base of the skull through the vertex without intravenous contrast. COMPARISON:  Head CT and brain MRI 02/24/2015 FINDINGS: Brain: No intracranial hemorrhage, mass effect, or midline shift. No hydrocephalus. The basilar cisterns are patent. No evidence of territorial infarct or acute ischemia. No extra-axial or  intracranial fluid collection. Vascular: No hyperdense vessel or unexpected calcification. Skull: No fracture or focal lesion. Sinuses/Orbits: Ethmoid air cell opacification may be secondary to intubation. Metallic density in the right periorbital soft tissues represents eyebrow ring. Complete opacification of right mastoid air cells, chronic. Moderate opacification of left mastoid air cells, improved from prior. Other: Small left parietal-occipital scalp hematoma. IMPRESSION: 1.  No acute intracranial abnormality. 2. Small left parietal-occipital scalp hematoma. Electronically Signed   By: Rubye Oaks M.D.   On: 07/30/2017 03:48   Dg Chest Portable 1 View  Result Date: 07/30/2017 CLINICAL DATA:  Intubation EXAM: PORTABLE CHEST 1 VIEW COMPARISON:  07/26/2017 FINDINGS: Endotracheal tube tip at the clavicular heads. An orogastric tube tip reaches the stomach with side port at the GE junction. Cardiomegaly. The lungs are clear. IMPRESSION: 1. Endotracheal  tube in good position. 2. Orogastric tube tip reaches the stomach with side port at the GE junction. 3. Cardiomegaly. Electronically Signed   By: Marnee Spring M.D.   On: 07/30/2017 02:58     STUDIES:  CT head 7/11 > no acute process, small left parietal-occipital scalp hematoa.  CULTURES: Blood 7/11 >  Urine 7/11 >  Sputum 7/11 >   ANTIBIOTICS: None.  SIGNIFICANT EVENTS: 7/11 > admit, intubated.  LINES/TUBES: ETT 7/11.  DISCUSSION: 52 y.o. male admitted 7/11 with respiratory distress requiring intubation in ED.  ASSESSMENT / PLAN:  PULMONARY A: Respiratory insufficiency - s/p intubation in ED. P:   Full vent support. F/u ABG and wean as able. VAP prevention measures. SBT in AM if able. CXR in AM.  CARDIOVASCULAR A:  Borderline hypotension - presumed from sedation. Hx HTN, sCHF (Echo from 07/27/17 with EF 20-25%). P:  D/c propofol and switch to fentanyl. Monitor hemodynamics. Trend troponin. Continue preadmission  ASA. Hold preadmission entresto, furosemide, bidil, spironolactone.  RENAL A:   AKI. P:   NS @ KVO. BMP in AM.  GASTROINTESTINAL A:   GI prophylaxis. Nutrition. P:   SUP: famotidine. NPO.  HEMATOLOGIC A:   VTE Prophylaxis. P:  SCD's / heparin. CBC in AM.  INFECTIOUS A:   No indication of infection. P:   Monitor clinically and follow cultures.  ENDOCRINE A: No acute issues. P: No interventions required.  NEUROLOGIC A:   Sedation needs due to mechanical ventilation. Polysubstance abuse - UDS in past positive for THC and cocaine. P:   Sedation:  Fentanyl gtt / Midazolam PRN. RASS goal: 0 to -1. Daily WUA. F/u on UDS. Hold preadmission cyclobenzaprine, norco.  Family updated: None available.  Interdisciplinary Family Meeting v Palliative Care Meeting:  Due by: 08/05/17.  CC time: 30 min.   Rutherford Guys, [place] [facility] [place] [facility] Pager: [phone]  or [phone] 07/30/2017, 4:03 AM

## 2017-07-30 NOTE — Progress Notes (Signed)
eLink Physician-Brief Progress Note Patient Name: [name] DOB: January 11, 1966 MRN: [id number]   Date of Service  07/30/2017  HPI/Events of Note  Patient admitted with acute resp failure s/p intubation. + polysubstance abuse  eICU Interventions  New patient evaluation.        [name] 07/30/2017, 6:29 AM

## 2017-07-30 NOTE — ED Triage Notes (Signed)
Pt presents to ER with [facility] for resp distress; pt with hx of uncontrolled HTN and flash pulm edema; EMS states they attempted to CPAP him but unable to tolerate, began to assist his ventilations with BVM, ETCO2 remained , 100HR ST, 158/120 Pt arrived here alert but unconsolable, accessory muscle use, JVD, emesis

## 2017-07-31 ENCOUNTER — Inpatient Hospital Stay (HOSPITAL_COMMUNITY): Payer: Medicaid Other

## 2017-07-31 DIAGNOSIS — D696 Thrombocytopenia, unspecified: Secondary | ICD-10-CM

## 2017-07-31 DIAGNOSIS — I428 Other cardiomyopathies: Secondary | ICD-10-CM

## 2017-07-31 DIAGNOSIS — F1410 Cocaine abuse, uncomplicated: Secondary | ICD-10-CM

## 2017-07-31 DIAGNOSIS — F1910 Other psychoactive substance abuse, uncomplicated: Secondary | ICD-10-CM

## 2017-07-31 DIAGNOSIS — I10 Essential (primary) hypertension: Secondary | ICD-10-CM

## 2017-07-31 DIAGNOSIS — I63511 Cerebral infarction due to unspecified occlusion or stenosis of right middle cerebral artery: Secondary | ICD-10-CM | POA: Diagnosis not present

## 2017-07-31 DIAGNOSIS — I426 Alcoholic cardiomyopathy: Secondary | ICD-10-CM

## 2017-07-31 HISTORY — DX: Thrombocytopenia, unspecified: D69.6

## 2017-07-31 LAB — BASIC METABOLIC PANEL
Anion gap: 8 (ref 5–15)
BUN: 12 mg/dL (ref 6–20)
CALCIUM: 8.7 mg/dL — AB (ref 8.9–10.3)
CO2: 27 mmol/L (ref 22–32)
Chloride: 102 mmol/L (ref 98–111)
Creatinine, Ser: 1 mg/dL (ref 0.61–1.24)
GFR calc Af Amer: >60 mL/min (ref >60)
Glucose, Bld: 106 mg/dL — ABNORMAL HIGH (ref 70–99)
POTASSIUM: 3.8 mmol/L (ref 3.5–5.1)
SODIUM: 137 mmol/L (ref 135–145)

## 2017-07-31 LAB — CBC
HCT: 38 % — ABNORMAL LOW (ref 39.0–52.0)
Hemoglobin: 12.5 g/dL — ABNORMAL LOW (ref 13.0–17.0)
MCH: 34.1 pg — ABNORMAL HIGH (ref 26.0–34.0)
MCHC: 32.9 g/dL (ref 30.0–36.0)
MCV: 103.5 fL — ABNORMAL HIGH (ref 78.0–100.0)
Platelets: 126 10*3/uL — ABNORMAL LOW (ref 150–400)
RBC: 3.67 MIL/uL — ABNORMAL LOW (ref 4.22–5.81)
RDW: 13.4 % (ref 11.5–15.5)
WBC: 7.3 10*3/uL (ref 4.0–10.5)

## 2017-07-31 LAB — MAGNESIUM: MAGNESIUM: 2.1 mg/dL (ref 1.7–2.4)

## 2017-07-31 LAB — URINE CULTURE: CULTURE: NO GROWTH

## 2017-07-31 LAB — PHOSPHORUS: Phosphorus: 3.5 mg/dL (ref 2.5–4.6)

## 2017-07-31 MED ORDER — ASPIRIN EC 325 MG PO TBEC: 325.0000 mg | DELAYED_RELEASE_TABLET | Freq: Every day | ORAL | Status: DC

## 2017-07-31 MED ORDER — BENZONATATE 100 MG PO CAPS
200.0000 mg | ORAL_CAPSULE | Freq: Three times a day (TID) | ORAL | Status: DC | PRN
  Administered 2017-07-31: 200 mg via ORAL
  Filled 2017-07-31: qty 2

## 2017-07-31 MED ORDER — VITAMIN B-1 100 MG PO TABS
100.0000 mg | ORAL_TABLET | Freq: Every day | ORAL | Status: DC
  Administered 2017-08-01: 100 mg
  Filled 2017-07-31: qty 1

## 2017-07-31 MED ORDER — ISOSORB DINITRATE-HYDRALAZINE 20-37.5 MG PO TABS
1.0000 | ORAL_TABLET | Freq: Three times a day (TID) | ORAL | Status: DC
  Administered 2017-07-31 – 2017-08-01 (×3): 1 via ORAL
  Filled 2017-07-31 (×4): qty 1

## 2017-07-31 MED ORDER — SACUBITRIL-VALSARTAN 49-51 MG PO TABS
1.0000 | ORAL_TABLET | Freq: Two times a day (BID) | ORAL | Status: DC
  Administered 2017-08-01: 1 via ORAL
  Filled 2017-07-31: qty 1

## 2017-07-31 MED ORDER — FUROSEMIDE 40 MG PO TABS
40.0000 mg | ORAL_TABLET | Freq: Every day | ORAL | Status: DC
  Administered 2017-08-01 (×2): 40 mg via ORAL
  Filled 2017-07-31 (×2): qty 1

## 2017-07-31 MED ORDER — SPIRONOLACTONE 25 MG PO TABS
25.0000 mg | ORAL_TABLET | Freq: Every day | ORAL | Status: DC
  Administered 2017-07-31 – 2017-08-01 (×2): 25 mg via ORAL
  Filled 2017-07-31 (×2): qty 1

## 2017-07-31 MED ORDER — FOLIC ACID 1 MG PO TABS: 1.0000 mg | ORAL_TABLET | Freq: Every day | ORAL | Status: DC

## 2017-07-31 MED ORDER — FOLIC ACID 1 MG PO TABS
1.0000 mg | ORAL_TABLET | Freq: Every day | ORAL | Status: DC
  Administered 2017-08-01: 1 mg via ORAL
  Filled 2017-07-31: qty 1

## 2017-07-31 MED ORDER — FAMOTIDINE 20 MG PO TABS
20.0000 mg | ORAL_TABLET | Freq: Two times a day (BID) | ORAL | Status: DC
  Administered 2017-07-31 – 2017-08-01 (×2): 20 mg
  Filled 2017-07-31 (×2): qty 1

## 2017-07-31 NOTE — Care Management Note (Addendum)
Case Management Note  Patient Details  Name: [name] MRN: [id number] Date of Birth: 12-28-1965  Subjective/Objective:  From home alone, has a walker and a cane .  He states he has an aide with [employer] also.  He [facility] to [facility] for his PCP and medications.  Presented with resp failure, chf, htn, psa, hypoglycemia, n/v was intubated in the ED, now on room air.    7/13 Cole Cape RN, BSN - patient for dc today, he has Medicaid and his co pay is 3.00, he can go to local pharmacy and get medications prescribed to him from this admit.  [facility] has his regular home meds that he takes and he will pick them up on Monday.                    Action/Plan: NCM will follow for dc needs.   Expected Discharge Date:                  Expected Discharge Plan:  Home/Self Care  In-House Referral:     Discharge planning Services  CM Consult  Post Acute Care Choice:    Choice offered to:     DME Arranged:    DME Agency:     HH Arranged:    HH Agency:     Status of Service:  In process, will continue to follow  If discussed at Long Length of Stay Meetings, dates discussed:    Additional Comments:  Cole Haven, RN 07/31/2017, 9:59 AM

## 2017-07-31 NOTE — Progress Notes (Signed)
Pt refused to let RT get am ABG.

## 2017-07-31 NOTE — Progress Notes (Signed)
PROGRESS NOTE  [name] UEA:[id number] DOB: [birth year] DOA: 07/30/2017 PCP: Hoy Register, MD  Brief Narrative: 52 year old man PMH cocaine use, hypertensive emergency/urgency, severe systolic CHF (alcoholic cardiomyopathy), diastolic CHF, marijuana and alcohol use; who presented 7/11 with severe respiratory distress, diaphoresis, failed CPAP in the field, BVM without success, vomiting, intubated in the emergency department.  Admitted by critical care for acute respiratory failure.  At admission there were no signs of volume overload and chest x-ray without infiltrates.  He was subsequently extubated the following day 7/11.  Unclear if distress was secondary to cocaine induced bronchospasm versus flash pulmonary edema from hypertensive emergency versus aspiration pneumonitis.  Discharge 7/8 after being treated for acute hypoxic respiratory failure secondary to acute on chronic systolic/diastolic CHF in the setting of hypertensive emergency in the setting of alcohol, cocaine and marijuana use.  Assessment/Plan Acute hypoxic respiratory failure, status post extubation 7/11.  Probably secondary to alcohol intoxication, cocaine use. --Resolved.  SPO2 92% on room air.  Essential hypertension --SBP 120-140s.  Diastolic blood pressure less than 100.  Avoid beta-blockade given cocaine use --Patient had not yet picked up medications from pharmacy from last discharge. --Resume BiDil, spironolactone. Resume Entresto tomorrow.  Acute thrombocytopenia, present on admission --Medically.  Repeat CBC in a.m.  Chronic systolic, diastolic CHF, severe LV dysfunction.  LVEF 20-25%, diffuse hypokinesis seen by echocardiogram 7/8. --Appears euvolemic.  Resume BiDil, spironolactone; resume Entresto and Lasix tomorrow.  Carvedilol held secondary to cocaine use.  Polysubstance abuse including cocaine, marijuana, alcohol.  UDS positive for marijuana, benzodiazepines, cocaine.  Serum alcohol level 172 on  admission. --Social work consult.  Recommend cessation.   Feels to be improving.  Monitor clinically.  Likely discharge 7/13.  DVT prophylaxis: heparin Code Status: full  Family Communication: none Disposition Plan: home    Brendia Sacks, MD  [facility] Direct contact: [phone] --Via amion app OR  --[email or web address]; password [email or web address]  7PM-7AM contact night coverage as above 07/31/2017, 11:29 AM  LOS: 1 day   Consultants:  PCCM admitted  Procedures:  ETT 7/10 > 7/11  Antimicrobials:    Interval history/Subjective: SOB earlier today, some nausea earlier today, poor appetite. Some generalized abd pain.  Objective: Vitals:  Vitals:   07/31/17 0748 07/31/17 0804  BP: (!) 139/93 (!) 144/91  Pulse: 72 82  Resp: 16 17  Temp: 98.1 F (36.7 C) 98.3 F (36.8 C)  SpO2: 95% 92%    Exam:  Constitutional:  . Appears calm and comfortable Respiratory:  . CTA bilaterally, no w/r/r.  . Respiratory effort normal.  Cardiovascular:  . RRR, no m/r/g . No LE extremity edema   Abdomen:  . Soft, mild generalized tenderness. Skin:  . No rashes, lesions, ulcers Neurologic:  . CN 2-12 intact . Sensation all 4 extremities intact Psychiatric:  . Mental status o Mood, affect appropriate  I have personally reviewed the following:   Labs:  Basic metabolic panel, phosphorus, magnesium unremarkable  Troponins 0.03, 0.04, flat   Hemoglobin 12.5, platelets 126  Imaging studies:  CT head no acute intracranial findings  Medical tests:  EKG shows sinus tachycardia, LVH, repolarization abnormality, interventricular conduction delay.  Compared to previous study 7/7, no acute changes seen.  Review and summation of old records:  Echo   - Left ventricle: The cavity size was moderately dilated. Wall thickness was increased in a pattern of moderate LVH. Systolic function was severely reduced. The estimated ejection fraction was in the range of 20% to 25%.  Diffuse hypokinesis. Doppler parameters  are consistent with restrictive physiology, indicative of decreased left ventricular diastolic compliance and/or increased left atrial pressure. - Aortic root: The aortic root was mildly dilated.  Impressions:  - Severe global reduction in LV systolic function; restrictive filling; moderate LVE; moderate LVH; mildly dilated aortic root.    Scheduled Meds: . aspirin  325 mg Per Tube Daily  . famotidine  20 mg Per Tube BID  . folic acid  1 mg Oral Daily  . [START ON 08/01/2017] furosemide  40 mg Oral Daily  . heparin  5,000 Units Subcutaneous Q8H  . isosorbide-hydrALAZINE  1 tablet Oral TID  . mouth rinse  15 mL Mouth Rinse BID  . multivitamin  15 mL Per Tube Daily  . [START ON 08/01/2017] sacubitril-valsartan  1 tablet Oral BID  . spironolactone  25 mg Oral Daily  . [START ON 08/01/2017] thiamine  100 mg Per Tube Daily   Continuous Infusions: . sodium chloride 250 mL (07/30/17 2209)  . dextrose 50 mL/hr at 07/31/17 0818    Active Problems:   Polysubstance abuse (HCC)   Acute respiratory failure with hypoxia (HCC)   Nonischemic cardiomyopathy (HCC)   Alcoholic cardiomyopathy (HCC)   Benign essential HTN   ETOH abuse   Cocaine abuse (HCC)   Thrombocytopenia (HCC)   LOS: 1 day

## 2017-08-01 DIAGNOSIS — I63511 Cerebral infarction due to unspecified occlusion or stenosis of right middle cerebral artery: Secondary | ICD-10-CM | POA: Diagnosis not present

## 2017-08-01 LAB — CBC
HEMATOCRIT: 34.3 % — AB (ref 39.0–52.0)
Hemoglobin: 11.6 g/dL — ABNORMAL LOW (ref 13.0–17.0)
MCH: 34.5 pg — ABNORMAL HIGH (ref 26.0–34.0)
MCHC: 33.8 g/dL (ref 30.0–36.0)
MCV: 102.1 fL — ABNORMAL HIGH (ref 78.0–100.0)
Platelets: 136 10*3/uL — ABNORMAL LOW (ref 150–400)
RBC: 3.36 MIL/uL — ABNORMAL LOW (ref 4.22–5.81)
RDW: 13.2 % (ref 11.5–15.5)
WBC: 6.6 10*3/uL (ref 4.0–10.5)

## 2017-08-01 MED ORDER — SPIRONOLACTONE 25 MG PO TABS: 25.0000 mg | ORAL_TABLET | Freq: Every day | ORAL | 0 refills | Status: DC

## 2017-08-01 MED ORDER — FUROSEMIDE 40 MG PO TABS: 40.0000 mg | ORAL_TABLET | Freq: Every day | ORAL | 0 refills | Status: DC

## 2017-08-01 MED ORDER — ACETAMINOPHEN 325 MG PO TABS
650.0000 mg | ORAL_TABLET | Freq: Four times a day (QID) | ORAL | Status: DC | PRN
  Administered 2017-08-01 (×2): 650 mg via ORAL
  Filled 2017-08-01 (×2): qty 2

## 2017-08-01 MED ORDER — ENTRESTO 49-51 MG PO TABS
1.0000 | ORAL_TABLET | Freq: Two times a day (BID) | ORAL | 0 refills | Status: AC
Start: 2017-08-01 — End: 2017-08-31

## 2017-08-01 MED ORDER — ISOSORB DINITRATE-HYDRALAZINE 20-37.5 MG PO TABS: 1.0000 | ORAL_TABLET | Freq: Three times a day (TID) | ORAL | 0 refills | Status: DC

## 2017-08-01 NOTE — Progress Notes (Signed)
Pt requesting to be DC today. RN paged attending. Pt's O2 sats at 97% on RA. Lungs clear in all fields, and lasix given PO as ordered. Will continue to monitor.

## 2017-08-01 NOTE — Discharge Summary (Signed)
Physician Discharge Summary  OBRYAN RADU ZOX:[id number] DOB: 04-13-1965 DOA: 07/30/2017  PCP: Hoy Register, MD  Admit date: 07/30/2017 Discharge date: 08/01/2017  Recommendations for Outpatient Follow-up:    Chronic systolic, diastolic CHF, severe LV dysfunction.  LVEF 20-25%, diffuse hypokinesis seen by echocardiogram 7/8.   Polysubstance abuse including cocaine, marijuana, alcohol.     Follow-up Information    Hoy Register, MD. Schedule an appointment as soon as possible for a visit in 1 week(s).   Specialty:  Family Medicine Contact information: [address] [place] [address] [phone]            Discharge Diagnoses:  1. Acute hypoxic respiratory failure 2. Essential hypertension 3. Acute thrombocytopenia, present on admission 4. Chronic systolic, diastolic CHF, severe LV dysfunction.  LVEF 20-25%, diffuse hypokinesis seen by echocardiogram 7/8. 5. Polysubstance abuse including cocaine, marijuana, alcohol  Discharge Condition: improved Disposition: home  Diet recommendation: heart healthy  Filed Weights   07/30/17 1852 07/31/17 0500 08/01/17 0700  Weight: 65.4 kg (144 lb 2.9 oz) 65.4 kg (144 lb 2.9 oz) 62.1 kg (136 lb 14.5 oz)    History of present illness:  52 year old man PMH cocaine use, hypertensive emergency/urgency, severe systolic CHF (alcoholic cardiomyopathy), diastolic CHF, marijuana and alcohol use; who presented 7/11 with severe respiratory distress, diaphoresis, failed CPAP in the field, BVM without success, vomiting, intubated in the emergency department.  Admitted by critical care for acute respiratory failure.    Hospital Course:  At admission there were no signs of volume overload and chest x-ray without infiltrates.  He was subsequently extubated the following day 7/11.  Unclear if distress was secondary to cocaine induced bronchospasm versus flash pulmonary edema from hypertensive emergency versus aspiration pneumonitis.   Of note the patient had not yet filled his medications from his last hospitalization.  This is probably a contributing factor.  Acute hypoxic respiratory failure, status post extubation 7/11.  Probably secondary to alcohol intoxication, cocaine use. --Resolved.  SPO2 92% on room air.  Essential hypertension --SBP 120-140s. Avoid beta-blockade given cocaine use --Patient had not yet picked up medications from pharmacy from last discharge. --Resume BiDil, spironolactone, Entresto.  Prescriptions given.  Acute thrombocytopenia, present on admission --Spontaneously resolving.  Chronic systolic, diastolic CHF, severe LV dysfunction.  LVEF 20-25%, diffuse hypokinesis seen by echocardiogram 7/8. --Stable.  Continue BiDil, Entresto, spironolactone.  Carvedilol held secondary to cocaine use.  Polysubstance abuse including cocaine, marijuana, alcohol.  UDS positive for marijuana, benzodiazepines, cocaine.  Serum alcohol level 172 on admission. --I recommended the patient stop using illegal drugs and recommended discontinuing alcohol use.  Consultants:  PCCM admitted  Procedures:  ETT 7/10 > 7/11  Today's assessment: S: Got short of breath earlier today but feels quite well now and desires to go home.  No shortness of breath now.  No pain. O: Vitals:  Vitals:   08/01/17 0815 08/01/17 1047  BP: (!) 141/99   Pulse: 79   Resp:    Temp: 98.5 F (36.9 C)   SpO2: 100% 97%    Constitutional:  . Appears calm and comfortable Respiratory:  . CTA bilaterally, no w/r/r.  . Respiratory effort normal.  Cardiovascular:  . RRR, no m/r/g Psychiatric:  . Mental status o Mood, affect appropriate  Platelets trending back up, 136.  Discharge Instructions  Discharge Instructions    Diet - low sodium heart healthy   Complete by:  As directed    Discharge instructions   Complete by:  As directed  Call your physician or seek immediate medical attention for chest pain, shortness of  breath, weight gain, swelling or worsening of condition. Do not use cocaine or other drugs. Recommend stopping alcohol under supervision of physician.   Increase activity slowly   Complete by:  As directed      Allergies as of 08/01/2017      Reactions   Penicillins Other (See Comments)   Blisters  Has patient had a PCN reaction causing immediate rash, facial/tongue/throat swelling, SOB or lightheadedness with hypotension: Yes Has patient had a PCN reaction causing severe rash involving mucus membranes or skin necrosis: Yes Has patient had a PCN reaction that required hospitalization: No Has patient had a PCN reaction occurring within the last 10 years: No If all of the above answers are "NO", then may proceed with Cephalosporin use.      Medication List    STOP taking these medications   guaiFENesin 600 MG 12 hr tablet Commonly known as:  MUCINEX     TAKE these medications   aspirin 325 MG EC tablet Take 1 tablet (325 mg total) by mouth daily.   ENTRESTO 49-51 MG Generic drug:  sacubitril-valsartan Take 1 tablet by mouth 2 (two) times daily.   folic acid 1 MG tablet Commonly known as:  FOLVITE Take 1 tablet (1 mg total) by mouth daily.   furosemide 40 MG tablet Commonly known as:  LASIX Take 1 tablet (40 mg total) by mouth daily.   isosorbide-hydrALAZINE 20-37.5 MG tablet Commonly known as:  BIDIL Take 1 tablet by mouth 3 (three) times daily.   Magnesium Oxide 400 MG Caps Take 1 capsule (400 mg total) by mouth 2 (two) times daily.   multivitamin with minerals Tabs tablet Take 1 tablet by mouth daily.   spironolactone 25 MG tablet Commonly known as:  ALDACTONE Take 1 tablet (25 mg total) by mouth daily.      Allergies  Allergen Reactions  . Penicillins Other (See Comments)    Blisters  Has patient had a PCN reaction causing immediate rash, facial/tongue/throat swelling, SOB or lightheadedness with hypotension: Yes Has patient had a PCN reaction causing  severe rash involving mucus membranes or skin necrosis: Yes Has patient had a PCN reaction that required hospitalization: No Has patient had a PCN reaction occurring within the last 10 years: No If all of the above answers are "NO", then may proceed with Cephalosporin use.     The results of significant diagnostics from this hospitalization (including imaging, microbiology, ancillary and laboratory) are listed below for reference.    Significant Diagnostic Studies: Ct Head Wo Contrast  Result Date: 07/30/2017 CLINICAL DATA:  Altered level of consciousness. EXAM: CT HEAD WITHOUT CONTRAST TECHNIQUE: Contiguous axial images were obtained from the base of the skull through the vertex without intravenous contrast. COMPARISON:  Head CT and brain MRI 02/24/2015 FINDINGS: Brain: No intracranial hemorrhage, mass effect, or midline shift. No hydrocephalus. The basilar cisterns are patent. No evidence of territorial infarct or acute ischemia. No extra-axial or intracranial fluid collection. Vascular: No hyperdense vessel or unexpected calcification. Skull: No fracture or focal lesion. Sinuses/Orbits: Ethmoid air cell opacification may be secondary to intubation. Metallic density in the right periorbital soft tissues represents eyebrow ring. Complete opacification of right mastoid air cells, chronic. Moderate opacification of left mastoid air cells, improved from prior. Other: Small left parietal-occipital scalp hematoma. IMPRESSION: 1.  No acute intracranial abnormality. 2. Small left parietal-occipital scalp hematoma. Electronically Signed   By: Rubye Oaks  M.D.   On: 07/30/2017 03:48   Dg Chest Port 1 View  Result Date: 07/31/2017 CLINICAL DATA:  Respiratory failure EXAM: PORTABLE CHEST 1 VIEW COMPARISON:  Chest x-rays dated 07/30/2017 and 07/26/2017. FINDINGS: Endotracheal tube has been removed.  Enteric tube has been removed. Stable cardiomegaly. Lungs are clear, although the costophrenic angles are  excluded on today's exam. No pneumothorax seen. No significant pleural effusion. No acute or suspicious osseous finding. IMPRESSION: 1. No active disease.  No evidence of pneumonia or pulmonary edema. 2. Stable cardiomegaly. 3. Interval removal of the endotracheal tube and nasogastric tube. Electronically Signed   By: Bary Richard M.D.   On: 07/31/2017 08:41   Dg Chest Portable 1 View  Result Date: 07/30/2017 CLINICAL DATA:  Intubation EXAM: PORTABLE CHEST 1 VIEW COMPARISON:  07/26/2017 FINDINGS: Endotracheal tube tip at the clavicular heads. An orogastric tube tip reaches the stomach with side port at the GE junction. Cardiomegaly. The lungs are clear. IMPRESSION: 1. Endotracheal tube in good position. 2. Orogastric tube tip reaches the stomach with side port at the GE junction. 3. Cardiomegaly. Electronically Signed   By: Marnee Spring M.D.   On: 07/30/2017 02:58    Microbiology: Recent Results (from the past 240 hour(s))  Culture, blood (routine x 2)     Status: None (Preliminary result)   Collection Time: 07/30/17  4:40 AM  Result Value Ref Range Status   Specimen Description BLOOD RIGHT HAND  Final   Special Requests   Final    BOTTLES DRAWN AEROBIC AND ANAEROBIC Blood Culture results may not be optimal due to an inadequate volume of blood received in culture bottles   Culture   Final    NO GROWTH 2 DAYS Performed at [facility], [address] [place], [place] [address]    Report Status PENDING  Incomplete  Culture, blood (routine x 2)     Status: None (Preliminary result)   Collection Time: 07/30/17  4:59 AM  Result Value Ref Range Status   Specimen Description BLOOD LEFT HAND  Final   Special Requests   Final    BOTTLES DRAWN AEROBIC AND ANAEROBIC Blood Culture results may not be optimal due to an inadequate volume of blood received in culture bottles   Culture   Final    NO GROWTH 2 DAYS Performed at [facility], [address] [place], [place] [address]     Report Status PENDING  Incomplete  Urine culture     Status: None   Collection Time: 07/30/17  5:40 AM  Result Value Ref Range Status   Specimen Description URINE, RANDOM  Final   Special Requests NONE  Final   Culture   Final    NO GROWTH Performed at [facility], [address], [place], [place] [address]    Report Status 07/31/2017 FINAL  Final  MRSA PCR Screening     Status: None   Collection Time: 07/30/17  5:44 AM  Result Value Ref Range Status   MRSA by PCR NEGATIVE NEGATIVE Final    Comment:        The GeneXpert MRSA Assay (FDA approved for NASAL specimens only), is one component of a comprehensive MRSA colonization surveillance program. It is not intended to diagnose MRSA infection nor to guide or monitor treatment for MRSA infections. Performed at [facility], [address], [place], [place] [address]      Labs: Basic Metabolic Panel: Recent Labs  Lab 07/26/17  1610 07/27/17 0319 07/30/17 0246 07/30/17 0454 07/31/17 0321  NA 142 138 139 142 137  K 4.4 3.0* 4.0 3.7 3.8  CL 113* 100 104 107 102  CO2 23 29 20* 19* 27  GLUCOSE 107* 125* 77 74 106*  BUN 10 7 17 16 12   CREATININE 1.04 1.03 1.26* 1.11 1.00  CALCIUM 9.2 9.5 9.8 8.9 8.7*  MG  --  1.5*  --  1.6* 2.1  PHOS  --   --   --  4.5 3.5   Liver Function Tests: Recent Labs  Lab 07/26/17 0402 07/27/17 0319 07/30/17 0246  AST 171* 45* 48*  ALT 91* 62* 47*  ALKPHOS 128* 99 89  BILITOT 0.7 1.2 0.6  PROT 6.6 6.1* 6.8  ALBUMIN 3.9 3.4* 4.1    Recent Labs  Lab 07/30/17 0246  AMMONIA 28   CBC: Recent Labs  Lab 07/26/17 0402 07/30/17 0246 07/30/17 0454 07/31/17 0321 08/01/17 0334  WBC 6.5 8.9 6.4 7.3 6.6  NEUTROABS 4.7 5.7  --   --   --   HGB 13.3 14.2 13.3 12.5* 11.6*  HCT 40.1 42.0 40.5 38.0* 34.3*  MCV 103.1* 101.7* 104.1* 103.5* 102.1*  PLT 166 184 147* 126* 136*   Cardiac Enzymes: Recent Labs  Lab 07/26/17 0402 07/30/17 0246 07/30/17 0941 07/30/17 1350    TROPONINI 0.03* 0.03* 0.04* 0.04*   BNP: BNP (last 3 results) Recent Labs    12/22/16 0508 07/26/17 0402 07/30/17 0247  BNP 1,096.9* 1,872.8* 381.3*    CBG: Recent Labs  Lab 07/30/17 0236 07/30/17 0720 07/30/17 0805 07/30/17 1107 07/30/17 1531  GLUCAP 78 61* 97 72 93    Active Problems:   Polysubstance abuse (HCC)   Acute respiratory failure with hypoxia (HCC)   Nonischemic cardiomyopathy (HCC)   Alcoholic cardiomyopathy (HCC)   Benign essential HTN   ETOH abuse   Cocaine abuse (HCC)   Thrombocytopenia (HCC)   Time coordinating discharge: 35 minutes  Signed:  Brendia Sacks, MD [facility] 08/01/2017, 5:05 PM

## 2017-08-01 NOTE — Progress Notes (Signed)
Pt started complaining of new onset SHOB. Crackles auscultated bilaterally. SpO2 100% on room air. Provider [place] on call notified. Okay to give lasix early. Pt. Placed on 1 L [place] for comfort. Will continue to monitor.

## 2017-08-01 NOTE — Progress Notes (Signed)
Pt given DC instructions. No further questions for RN. Vitals stable upon DC. Belongings returned.  Blane Ohara RN

## 2017-08-01 NOTE — Care Management (Signed)
Pt called back to hospital after d/c stating he couldn't afford prescriptions.  Pharmacist states that his scripts were filled 18 days early at [facility] and they could not charge Medicaid again.  Pt states he was in the hospital when the scripts were filled and he could not get there in time to obtain medications.  Pharmacy offered for patient to fill 2 days worth of scripts, which patient states he cannot afford (Lasix & Spironolactone =<$10, Bidil & Entresto would cost >$100).  Pt states he may have old medications at home that he can use until Monday.  If not, will consider buying lasix and spironolactone.  On Monday, patient can obtain medication from [facility] or have scripts transferred to [facility].

## 2017-08-01 NOTE — Discharge Instructions (Addendum)

## 2017-08-02 DIAGNOSIS — I63511 Cerebral infarction due to unspecified occlusion or stenosis of right middle cerebral artery: Secondary | ICD-10-CM | POA: Diagnosis not present

## 2017-08-03 DIAGNOSIS — I63511 Cerebral infarction due to unspecified occlusion or stenosis of right middle cerebral artery: Secondary | ICD-10-CM | POA: Diagnosis not present

## 2017-08-04 DIAGNOSIS — I63511 Cerebral infarction due to unspecified occlusion or stenosis of right middle cerebral artery: Secondary | ICD-10-CM | POA: Diagnosis not present

## 2017-08-04 LAB — CULTURE, BLOOD (ROUTINE X 2)
CULTURE: NO GROWTH
Culture: NO GROWTH

## 2017-08-05 ENCOUNTER — Ambulatory Visit: Payer: Medicaid Other | Attending: Family Medicine | Admitting: Family Medicine

## 2017-08-05 ENCOUNTER — Encounter: Payer: Self-pay | Admitting: Family Medicine

## 2017-08-05 VITALS — BP 127/80 | HR 85 | Temp 97.6°F | Ht 68.0 in | Wt 145.6 lb

## 2017-08-05 DIAGNOSIS — F1210 Cannabis abuse, uncomplicated: Secondary | ICD-10-CM | POA: Diagnosis not present

## 2017-08-05 DIAGNOSIS — Z880 Allergy status to penicillin: Secondary | ICD-10-CM | POA: Diagnosis not present

## 2017-08-05 DIAGNOSIS — F1410 Cocaine abuse, uncomplicated: Secondary | ICD-10-CM | POA: Insufficient documentation

## 2017-08-05 DIAGNOSIS — Z7982 Long term (current) use of aspirin: Secondary | ICD-10-CM | POA: Diagnosis not present

## 2017-08-05 DIAGNOSIS — F1910 Other psychoactive substance abuse, uncomplicated: Secondary | ICD-10-CM | POA: Diagnosis not present

## 2017-08-05 DIAGNOSIS — R05 Cough: Secondary | ICD-10-CM

## 2017-08-05 DIAGNOSIS — I110 Hypertensive heart disease with heart failure: Secondary | ICD-10-CM | POA: Insufficient documentation

## 2017-08-05 DIAGNOSIS — R059 Cough, unspecified: Secondary | ICD-10-CM

## 2017-08-05 DIAGNOSIS — I10 Essential (primary) hypertension: Secondary | ICD-10-CM

## 2017-08-05 DIAGNOSIS — Z720 Tobacco use: Secondary | ICD-10-CM

## 2017-08-05 DIAGNOSIS — G4489 Other headache syndrome: Secondary | ICD-10-CM | POA: Diagnosis not present

## 2017-08-05 DIAGNOSIS — J9600 Acute respiratory failure, unspecified whether with hypoxia or hypercapnia: Secondary | ICD-10-CM | POA: Diagnosis not present

## 2017-08-05 DIAGNOSIS — J449 Chronic obstructive pulmonary disease, unspecified: Secondary | ICD-10-CM | POA: Insufficient documentation

## 2017-08-05 DIAGNOSIS — F17210 Nicotine dependence, cigarettes, uncomplicated: Secondary | ICD-10-CM | POA: Diagnosis not present

## 2017-08-05 DIAGNOSIS — I69349 Monoplegia of lower limb following cerebral infarction affecting unspecified side: Secondary | ICD-10-CM | POA: Diagnosis present

## 2017-08-05 DIAGNOSIS — I5043 Acute on chronic combined systolic (congestive) and diastolic (congestive) heart failure: Secondary | ICD-10-CM | POA: Diagnosis present

## 2017-08-05 DIAGNOSIS — I63511 Cerebral infarction due to unspecified occlusion or stenosis of right middle cerebral artery: Secondary | ICD-10-CM | POA: Diagnosis not present

## 2017-08-05 DIAGNOSIS — Z79899 Other long term (current) drug therapy: Secondary | ICD-10-CM | POA: Insufficient documentation

## 2017-08-05 MED ORDER — ALBUTEROL SULFATE HFA 108 (90 BASE) MCG/ACT IN AERS
2.0000 | INHALATION_SPRAY | Freq: Four times a day (QID) | RESPIRATORY_TRACT | 0 refills | Status: DC | PRN
Start: 2017-08-05 — End: 2017-10-20

## 2017-08-05 MED ORDER — CETIRIZINE HCL 10 MG PO TABS: 10.0000 mg | ORAL_TABLET | Freq: Every day | ORAL | 1 refills | Status: DC

## 2017-08-05 MED FILL — NICOTINE 21 MG/24HR PATCH: 21 | 28 days supply | Qty: 28 | Fill #0

## 2017-08-05 MED FILL — BIDIL TABLET: 20-37.5 | 30 days supply | Qty: 90 | Fill #0

## 2017-08-05 MED FILL — MAGNESIUM OXIDE 400 MG TAB: 400 | 30 days supply | Qty: 60 | Fill #0

## 2017-08-05 MED FILL — FUROSEMIDE 40 MG TAB: 40 | 30 days supply | Qty: 30 | Fill #0

## 2017-08-05 MED FILL — SPIRONOLACTONE 25 MG TABLET: 25 | 30 days supply | Qty: 30 | Fill #0

## 2017-08-05 MED FILL — PROAIR HFA 90 MCG INHALER: 108 (90 BAS | 25 days supply | Qty: 9 | Fill #0

## 2017-08-05 NOTE — Patient Instructions (Signed)
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
## 2017-08-06 DIAGNOSIS — I63511 Cerebral infarction due to unspecified occlusion or stenosis of right middle cerebral artery: Secondary | ICD-10-CM | POA: Diagnosis not present

## 2017-08-07 DIAGNOSIS — I63511 Cerebral infarction due to unspecified occlusion or stenosis of right middle cerebral artery: Secondary | ICD-10-CM | POA: Diagnosis not present

## 2017-08-08 DIAGNOSIS — I63511 Cerebral infarction due to unspecified occlusion or stenosis of right middle cerebral artery: Secondary | ICD-10-CM | POA: Diagnosis not present

## 2017-08-09 DIAGNOSIS — I63511 Cerebral infarction due to unspecified occlusion or stenosis of right middle cerebral artery: Secondary | ICD-10-CM | POA: Diagnosis not present

## 2017-08-10 DIAGNOSIS — I63511 Cerebral infarction due to unspecified occlusion or stenosis of right middle cerebral artery: Secondary | ICD-10-CM | POA: Diagnosis not present

## 2017-08-11 DIAGNOSIS — I63511 Cerebral infarction due to unspecified occlusion or stenosis of right middle cerebral artery: Secondary | ICD-10-CM | POA: Diagnosis not present

## 2017-08-12 DIAGNOSIS — I63511 Cerebral infarction due to unspecified occlusion or stenosis of right middle cerebral artery: Secondary | ICD-10-CM | POA: Diagnosis not present

## 2017-08-13 DIAGNOSIS — I63511 Cerebral infarction due to unspecified occlusion or stenosis of right middle cerebral artery: Secondary | ICD-10-CM | POA: Diagnosis not present

## 2017-08-14 DIAGNOSIS — I63511 Cerebral infarction due to unspecified occlusion or stenosis of right middle cerebral artery: Secondary | ICD-10-CM | POA: Diagnosis not present

## 2017-08-15 DIAGNOSIS — I63511 Cerebral infarction due to unspecified occlusion or stenosis of right middle cerebral artery: Secondary | ICD-10-CM | POA: Diagnosis not present

## 2017-08-16 DIAGNOSIS — I63511 Cerebral infarction due to unspecified occlusion or stenosis of right middle cerebral artery: Secondary | ICD-10-CM | POA: Diagnosis not present

## 2017-08-17 ENCOUNTER — Encounter (HOSPITAL_COMMUNITY): Payer: Medicaid Other

## 2017-08-17 DIAGNOSIS — I63511 Cerebral infarction due to unspecified occlusion or stenosis of right middle cerebral artery: Secondary | ICD-10-CM | POA: Diagnosis not present
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
## 2017-08-18 ENCOUNTER — Inpatient Hospital Stay (HOSPITAL_COMMUNITY): Admission: RE | Admit: 2017-08-18 | Payer: Medicaid Other | Source: Ambulatory Visit

## 2017-08-18 DIAGNOSIS — I63511 Cerebral infarction due to unspecified occlusion or stenosis of right middle cerebral artery: Secondary | ICD-10-CM | POA: Diagnosis not present

## 2017-08-19 ENCOUNTER — Encounter: Payer: Self-pay | Admitting: Adult Health

## 2017-08-19 ENCOUNTER — Ambulatory Visit: Payer: Medicaid Other | Admitting: Adult Health

## 2017-08-19 VITALS — BP 118/62 | HR 76 | Ht 68.0 in | Wt 144.8 lb

## 2017-08-19 DIAGNOSIS — Z79899 Other long term (current) drug therapy: Secondary | ICD-10-CM

## 2017-08-19 DIAGNOSIS — Z9114 Patient's other noncompliance with medication regimen: Secondary | ICD-10-CM

## 2017-08-19 DIAGNOSIS — F1910 Other psychoactive substance abuse, uncomplicated: Secondary | ICD-10-CM

## 2017-08-19 DIAGNOSIS — I10 Essential (primary) hypertension: Secondary | ICD-10-CM | POA: Diagnosis not present

## 2017-08-19 DIAGNOSIS — I428 Other cardiomyopathies: Secondary | ICD-10-CM

## 2017-08-19 DIAGNOSIS — I5042 Chronic combined systolic (congestive) and diastolic (congestive) heart failure: Secondary | ICD-10-CM | POA: Diagnosis not present

## 2017-08-19 DIAGNOSIS — I63511 Cerebral infarction due to unspecified occlusion or stenosis of right middle cerebral artery: Secondary | ICD-10-CM | POA: Diagnosis not present

## 2017-08-19 MED ORDER — FUROSEMIDE 40 MG PO TABS: 40.0000 mg | ORAL_TABLET | Freq: Every day | ORAL | 1 refills | Status: DC

## 2017-08-19 MED ORDER — HYDRALAZINE HCL 50 MG PO TABS: 50.0000 mg | ORAL_TABLET | Freq: Three times a day (TID) | ORAL | 5 refills | Status: DC

## 2017-08-19 MED FILL — hydrALAZINE HCL 50 MG TABS: 50 | 30 days supply | Qty: 90 | Fill #0

## 2017-08-19 NOTE — Patient Instructions (Signed)
Medication Instructions:  Stop bidil  Start hydralazine 50mg  three times daily  If you need a refill on your cardiac medications before your next appointment, please call your pharmacy.  Labwork: bmet 3 days to a week before follow up appointment HERE IN OUR OFFICE AT [facility]  Take the provided lab slips with you to the lab for your blood draw.   You will NOT need to fast   Follow-Up: Your physician wants you to follow-up in: 1 month with Dr [name] -OR- KATHRYN LAWRENCE (NURSE PRACTIONIER), DNP,AACC IF PRIMARY CARDIOLOGIST IS UNAVAILABLE.    Thank you for choosing [facility] at [facility]!!

## 2017-08-20 DIAGNOSIS — I63511 Cerebral infarction due to unspecified occlusion or stenosis of right middle cerebral artery: Secondary | ICD-10-CM | POA: Diagnosis not present

## 2017-08-21 DIAGNOSIS — I63511 Cerebral infarction due to unspecified occlusion or stenosis of right middle cerebral artery: Secondary | ICD-10-CM | POA: Diagnosis not present

## 2017-08-22 DIAGNOSIS — I63511 Cerebral infarction due to unspecified occlusion or stenosis of right middle cerebral artery: Secondary | ICD-10-CM | POA: Diagnosis not present

## 2017-08-23 DIAGNOSIS — I63511 Cerebral infarction due to unspecified occlusion or stenosis of right middle cerebral artery: Secondary | ICD-10-CM | POA: Diagnosis not present

## 2017-08-24 DIAGNOSIS — I63511 Cerebral infarction due to unspecified occlusion or stenosis of right middle cerebral artery: Secondary | ICD-10-CM | POA: Diagnosis not present

## 2017-08-25 ENCOUNTER — Encounter (HOSPITAL_COMMUNITY): Payer: Medicaid Other

## 2017-08-25 DIAGNOSIS — I63511 Cerebral infarction due to unspecified occlusion or stenosis of right middle cerebral artery: Secondary | ICD-10-CM | POA: Diagnosis not present

## 2017-08-26 DIAGNOSIS — I63511 Cerebral infarction due to unspecified occlusion or stenosis of right middle cerebral artery: Secondary | ICD-10-CM | POA: Diagnosis not present

## 2017-08-27 DIAGNOSIS — I63511 Cerebral infarction due to unspecified occlusion or stenosis of right middle cerebral artery: Secondary | ICD-10-CM | POA: Diagnosis not present

## 2017-08-28 DIAGNOSIS — I63511 Cerebral infarction due to unspecified occlusion or stenosis of right middle cerebral artery: Secondary | ICD-10-CM | POA: Diagnosis not present

## 2017-08-29 DIAGNOSIS — I63511 Cerebral infarction due to unspecified occlusion or stenosis of right middle cerebral artery: Secondary | ICD-10-CM | POA: Diagnosis not present

## 2017-08-30 DIAGNOSIS — I63511 Cerebral infarction due to unspecified occlusion or stenosis of right middle cerebral artery: Secondary | ICD-10-CM | POA: Diagnosis not present

## 2017-08-31 DIAGNOSIS — H5212 Myopia, left eye: Secondary | ICD-10-CM | POA: Diagnosis not present

## 2017-08-31 DIAGNOSIS — I63511 Cerebral infarction due to unspecified occlusion or stenosis of right middle cerebral artery: Secondary | ICD-10-CM | POA: Diagnosis not present

## 2017-08-31 DIAGNOSIS — H524 Presbyopia: Secondary | ICD-10-CM | POA: Diagnosis not present

## 2017-08-31 DIAGNOSIS — H52223 Regular astigmatism, bilateral: Secondary | ICD-10-CM | POA: Diagnosis not present

## 2017-08-31 MED FILL — FUROSEMIDE 40 MG TAB: 40 | 90 days supply | Qty: 90 | Fill #0

## 2017-08-31 MED FILL — SPIRONOLACTONE 25 MG TABLET: 25 | 90 days supply | Qty: 90 | Fill #0

## 2017-09-01 ENCOUNTER — Ambulatory Visit (HOSPITAL_COMMUNITY)
Admission: RE | Admit: 2017-09-01 | Discharge: 2017-09-01 | Disposition: A | Discharge status: Home / Self Care | Payer: Medicaid Other | Source: Ambulatory Visit | Attending: Family Medicine | Admitting: Family Medicine

## 2017-09-01 DIAGNOSIS — R05 Cough: Secondary | ICD-10-CM | POA: Diagnosis present

## 2017-09-01 DIAGNOSIS — Z720 Tobacco use: Secondary | ICD-10-CM | POA: Diagnosis present

## 2017-09-01 DIAGNOSIS — R059 Cough, unspecified: Secondary | ICD-10-CM

## 2017-09-01 DIAGNOSIS — I63511 Cerebral infarction due to unspecified occlusion or stenosis of right middle cerebral artery: Secondary | ICD-10-CM | POA: Diagnosis not present

## 2017-09-01 LAB — PULMONARY FUNCTION TEST
DL/VA % pred: 66 %
DL/VA: 3 ml/min/mmHg/L
DLCO unc % pred: 53 %
DLCO unc: 15.71 ml/min/mmHg
FEF 25-75 Post: 3.35 L/sec
FEF 25-75 Pre: 2.5 L/sec
FEF2575-%CHANGE-POST: 34 %
FEF2575-%Pred-Post: 109 %
FEF2575-%Pred-Pre: 81 %
FEV1-%Change-Post: 7 %
FEV1-%PRED-POST: 98 %
FEV1-%Pred-Pre: 91 %
FEV1-POST: 3.04 L
FEV1-PRE: 2.82 L
FEV1FVC-%CHANGE-POST: 4 %
FEV1FVC-%Pred-Pre: 96 %
FEV6-%Change-Post: 4 %
FEV6-%PRED-PRE: 96 %
FEV6-%Pred-Post: 99 %
FEV6-PRE: 3.6 L
FEV6-Post: 3.75 L
FEV6FVC-%Change-Post: 1 %
FEV6FVC-%PRED-PRE: 101 %
FEV6FVC-%Pred-Post: 103 %
FVC-%CHANGE-POST: 2 %
FVC-%PRED-PRE: 94 %
FVC-%Pred-Post: 96 %
FVC-POST: 3.75 L
FVC-PRE: 3.65 L
PRE FEV6/FVC RATIO: 99 %
Post FEV1/FVC ratio: 81 %
Post FEV6/FVC ratio: 100 %
Pre FEV1/FVC ratio: 77 %
RV % PRED: 94 %
RV: 1.86 L
TLC % pred: 84 %
TLC: 5.55 L

## 2017-09-01 MED ORDER — ALBUTEROL SULFATE (2.5 MG/3ML) 0.083% IN NEBU
2.5000 mg | INHALATION_SOLUTION | Freq: Once | RESPIRATORY_TRACT | Status: AC
  Administered 2017-09-01: 2.5 mg via RESPIRATORY_TRACT

## 2017-09-02 DIAGNOSIS — I63511 Cerebral infarction due to unspecified occlusion or stenosis of right middle cerebral artery: Secondary | ICD-10-CM | POA: Diagnosis not present

## 2017-09-03 DIAGNOSIS — I63511 Cerebral infarction due to unspecified occlusion or stenosis of right middle cerebral artery: Secondary | ICD-10-CM | POA: Diagnosis not present

## 2017-09-04 ENCOUNTER — Telehealth: Payer: Self-pay

## 2017-09-04 DIAGNOSIS — I63511 Cerebral infarction due to unspecified occlusion or stenosis of right middle cerebral artery: Secondary | ICD-10-CM | POA: Diagnosis not present

## 2017-09-04 NOTE — Telephone Encounter (Signed)
Patient was called and informed of lab results. 

## 2017-09-04 NOTE — Telephone Encounter (Signed)
-----   Message from Hoy Register, MD sent at 09/01/2017  6:18 PM EDT ----- Pulmonary function test reveals minimal obstructive airway disease and I will need him to work on quitting smoking.  Advised to continue with his albuterol inhaler and contact the clinic if his symptoms are not controlled.

## 2017-09-05 DIAGNOSIS — I63511 Cerebral infarction due to unspecified occlusion or stenosis of right middle cerebral artery: Secondary | ICD-10-CM | POA: Diagnosis not present

## 2017-09-06 DIAGNOSIS — I63511 Cerebral infarction due to unspecified occlusion or stenosis of right middle cerebral artery: Secondary | ICD-10-CM | POA: Diagnosis not present

## 2017-09-07 DIAGNOSIS — I63511 Cerebral infarction due to unspecified occlusion or stenosis of right middle cerebral artery: Secondary | ICD-10-CM | POA: Diagnosis not present

## 2017-09-08 DIAGNOSIS — I63511 Cerebral infarction due to unspecified occlusion or stenosis of right middle cerebral artery: Secondary | ICD-10-CM | POA: Diagnosis not present

## 2017-09-09 DIAGNOSIS — I63511 Cerebral infarction due to unspecified occlusion or stenosis of right middle cerebral artery: Secondary | ICD-10-CM | POA: Diagnosis not present

## 2017-09-10 DIAGNOSIS — I63511 Cerebral infarction due to unspecified occlusion or stenosis of right middle cerebral artery: Secondary | ICD-10-CM | POA: Diagnosis not present

## 2017-09-11 DIAGNOSIS — I63511 Cerebral infarction due to unspecified occlusion or stenosis of right middle cerebral artery: Secondary | ICD-10-CM | POA: Diagnosis not present

## 2017-09-12 DIAGNOSIS — I63511 Cerebral infarction due to unspecified occlusion or stenosis of right middle cerebral artery: Secondary | ICD-10-CM | POA: Diagnosis not present

## 2017-09-13 DIAGNOSIS — I63511 Cerebral infarction due to unspecified occlusion or stenosis of right middle cerebral artery: Secondary | ICD-10-CM | POA: Diagnosis not present

## 2017-09-14 DIAGNOSIS — I63511 Cerebral infarction due to unspecified occlusion or stenosis of right middle cerebral artery: Secondary | ICD-10-CM | POA: Diagnosis not present

## 2017-09-15 ENCOUNTER — Telehealth: Payer: Self-pay | Admitting: Family Medicine

## 2017-09-15 DIAGNOSIS — I63511 Cerebral infarction due to unspecified occlusion or stenosis of right middle cerebral artery: Secondary | ICD-10-CM | POA: Diagnosis not present

## 2017-09-15 NOTE — Telephone Encounter (Signed)
Will route to PCP for review. 

## 2017-09-15 NOTE — Telephone Encounter (Signed)
[name] called requesting a c-pap machine per patient request. Please follow up.

## 2017-09-16 DIAGNOSIS — I63511 Cerebral infarction due to unspecified occlusion or stenosis of right middle cerebral artery: Secondary | ICD-10-CM | POA: Diagnosis not present

## 2017-09-17 DIAGNOSIS — I63511 Cerebral infarction due to unspecified occlusion or stenosis of right middle cerebral artery: Secondary | ICD-10-CM | POA: Diagnosis not present

## 2017-09-17 NOTE — Telephone Encounter (Signed)
His sleep study from 10/2016 revealed no significant obstructive sleep apnea.

## 2017-09-17 NOTE — Telephone Encounter (Signed)
Patient was called and a voicemail was left informing patient to return phone call. 
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
## 2017-09-18 DIAGNOSIS — I63511 Cerebral infarction due to unspecified occlusion or stenosis of right middle cerebral artery: Secondary | ICD-10-CM | POA: Diagnosis not present

## 2017-09-19 DIAGNOSIS — I63511 Cerebral infarction due to unspecified occlusion or stenosis of right middle cerebral artery: Secondary | ICD-10-CM | POA: Diagnosis not present

## 2017-09-20 DIAGNOSIS — I63511 Cerebral infarction due to unspecified occlusion or stenosis of right middle cerebral artery: Secondary | ICD-10-CM | POA: Diagnosis not present

## 2017-09-21 DIAGNOSIS — I63511 Cerebral infarction due to unspecified occlusion or stenosis of right middle cerebral artery: Secondary | ICD-10-CM | POA: Diagnosis not present

## 2017-09-22 ENCOUNTER — Ambulatory Visit: Payer: Medicaid Other | Admitting: Family Medicine

## 2017-09-22 ENCOUNTER — Ambulatory Visit: Payer: Medicaid Other | Admitting: Adult Health

## 2017-09-22 DIAGNOSIS — I63511 Cerebral infarction due to unspecified occlusion or stenosis of right middle cerebral artery: Secondary | ICD-10-CM | POA: Diagnosis not present

## 2017-09-23 DIAGNOSIS — I63511 Cerebral infarction due to unspecified occlusion or stenosis of right middle cerebral artery: Secondary | ICD-10-CM | POA: Diagnosis not present

## 2017-09-24 DIAGNOSIS — I63511 Cerebral infarction due to unspecified occlusion or stenosis of right middle cerebral artery: Secondary | ICD-10-CM | POA: Diagnosis not present

## 2017-09-25 DIAGNOSIS — I63511 Cerebral infarction due to unspecified occlusion or stenosis of right middle cerebral artery: Secondary | ICD-10-CM | POA: Diagnosis not present

## 2017-09-26 DIAGNOSIS — I63511 Cerebral infarction due to unspecified occlusion or stenosis of right middle cerebral artery: Secondary | ICD-10-CM | POA: Diagnosis not present

## 2017-09-27 DIAGNOSIS — I63511 Cerebral infarction due to unspecified occlusion or stenosis of right middle cerebral artery: Secondary | ICD-10-CM | POA: Diagnosis not present

## 2017-09-28 DIAGNOSIS — I63511 Cerebral infarction due to unspecified occlusion or stenosis of right middle cerebral artery: Secondary | ICD-10-CM | POA: Diagnosis not present

## 2017-09-29 DIAGNOSIS — I63511 Cerebral infarction due to unspecified occlusion or stenosis of right middle cerebral artery: Secondary | ICD-10-CM | POA: Diagnosis not present

## 2017-09-30 DIAGNOSIS — I63511 Cerebral infarction due to unspecified occlusion or stenosis of right middle cerebral artery: Secondary | ICD-10-CM | POA: Diagnosis not present

## 2017-10-01 DIAGNOSIS — I63511 Cerebral infarction due to unspecified occlusion or stenosis of right middle cerebral artery: Secondary | ICD-10-CM | POA: Diagnosis not present

## 2017-10-02 DIAGNOSIS — I63511 Cerebral infarction due to unspecified occlusion or stenosis of right middle cerebral artery: Secondary | ICD-10-CM | POA: Diagnosis not present

## 2017-10-03 DIAGNOSIS — I63511 Cerebral infarction due to unspecified occlusion or stenosis of right middle cerebral artery: Secondary | ICD-10-CM | POA: Diagnosis not present

## 2017-10-04 DIAGNOSIS — I63511 Cerebral infarction due to unspecified occlusion or stenosis of right middle cerebral artery: Secondary | ICD-10-CM | POA: Diagnosis not present

## 2017-10-04 IMAGING — MR MR LUMBAR SPINE W/O CM
4 of 5 series · 17 of 48 positions shown · non-contrast
Comparison: None.

CLINICAL DATA: Initial evaluation for acute lower back pain for 2
days. Also with pain in both legs.

EXAM:
MRI LUMBAR SPINE WITHOUT CONTRAST
TECHNIQUE: Multiplanar, multisequence MR imaging of the lumbar spine was
performed. No intravenous contrast was administered.

[Series 4: T2 · sagittal · 4.0mm · 0.57mm/px · 6 of 14 slices shown (1 of 2)]
[im 1/14]
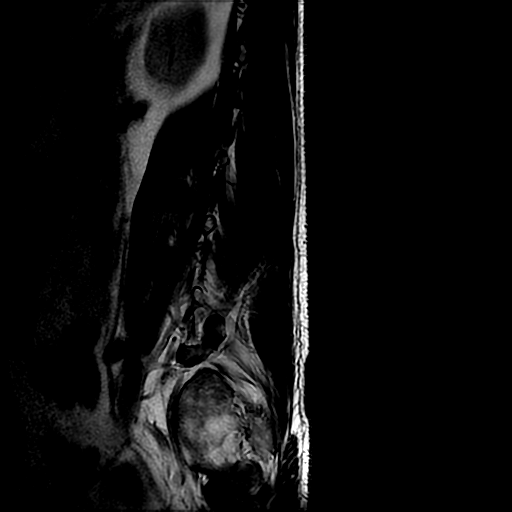
[im 3/14]
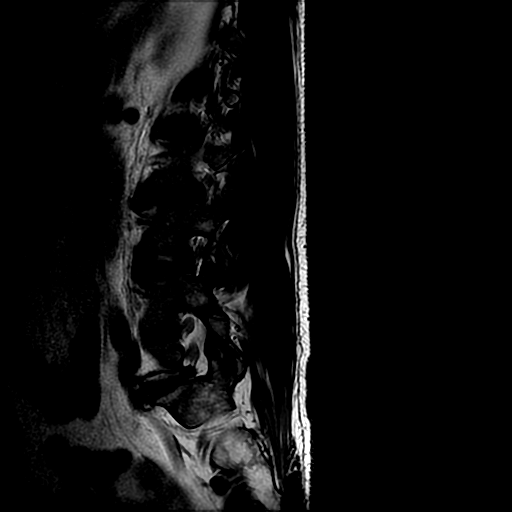
[im 6/14]
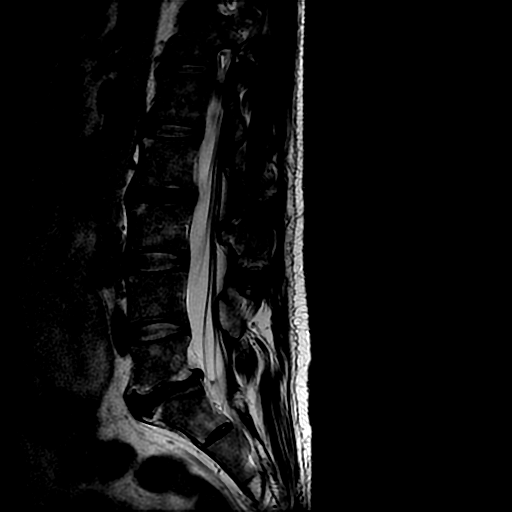
[im 8/14]
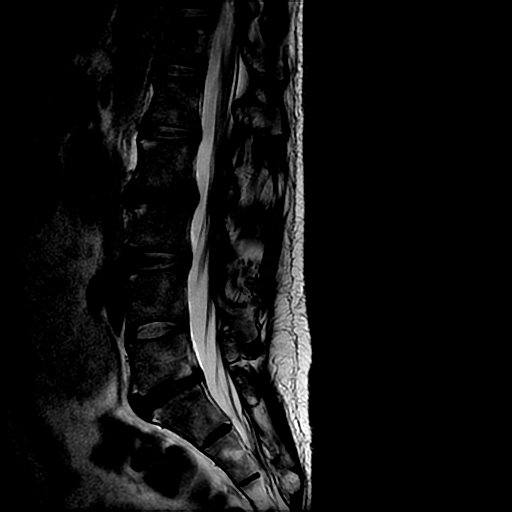
[im 11/14]
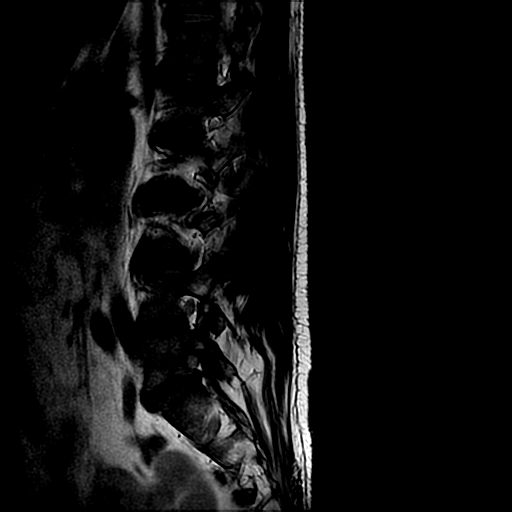
[im 14/14]
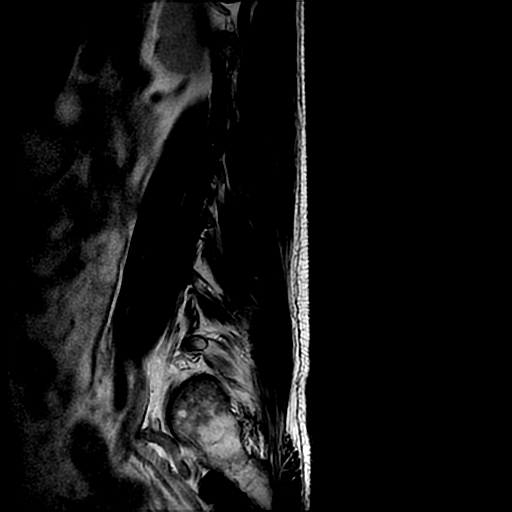

[Series 5: T1 · sagittal · 4.0mm · 0.57mm/px · 3 of 14 slices shown (1 of 2)]
[im 3/14]
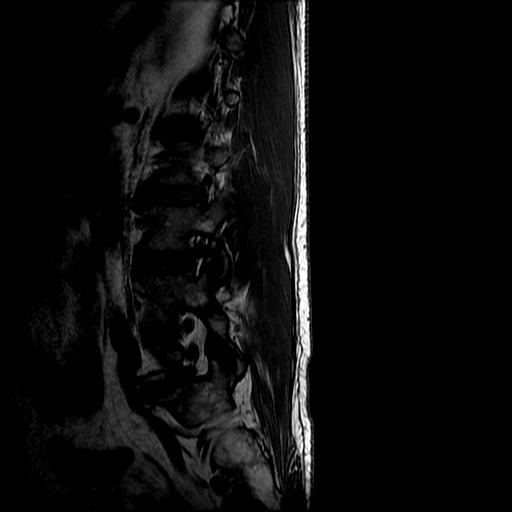
[im 8/14]
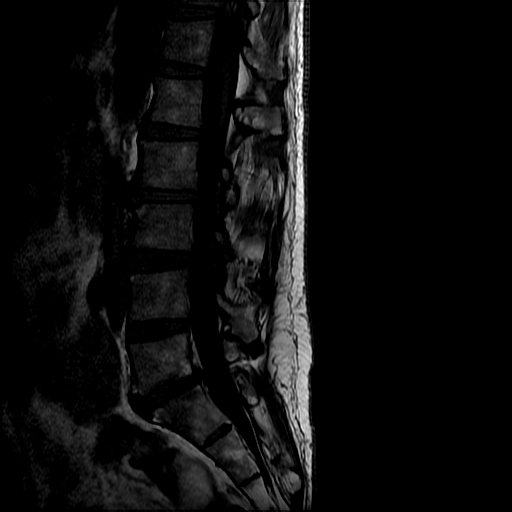
[im 14/14]
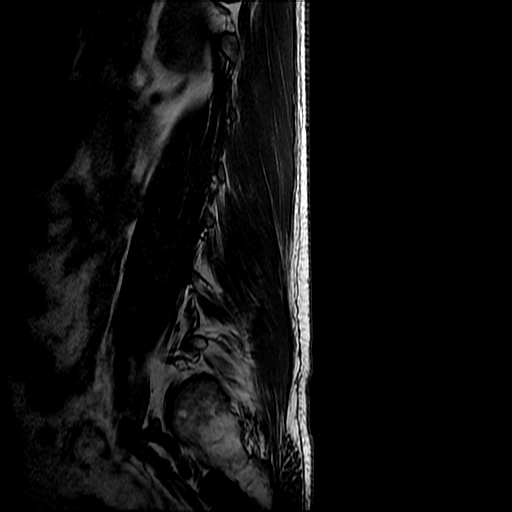

[Series 7: T2 · axial · 4.0mm · 0.39mm/px · z∈[-153,-3]mm · 5 of 35 slices shown (2 of 2)]
[im 1/35]
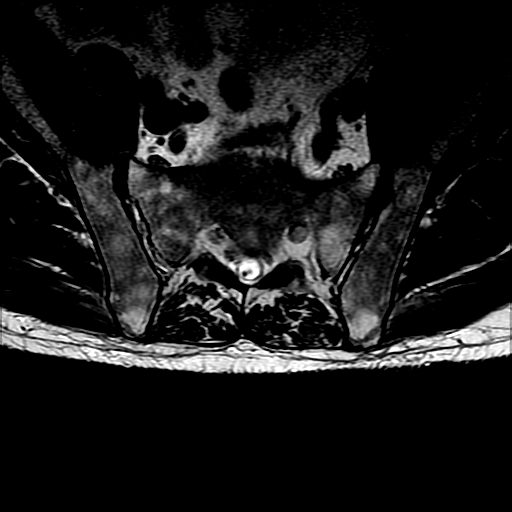
[im 5/35]
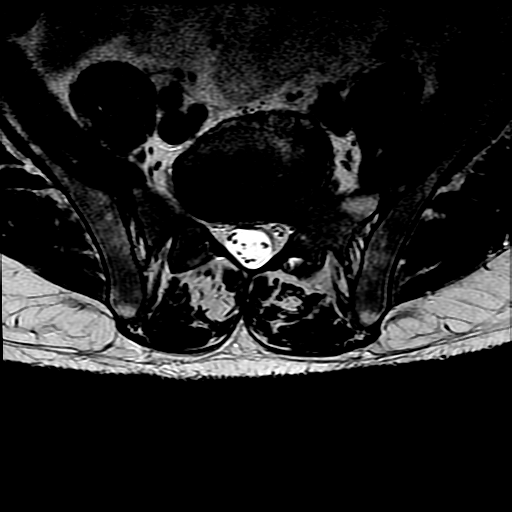
[im 10/35]
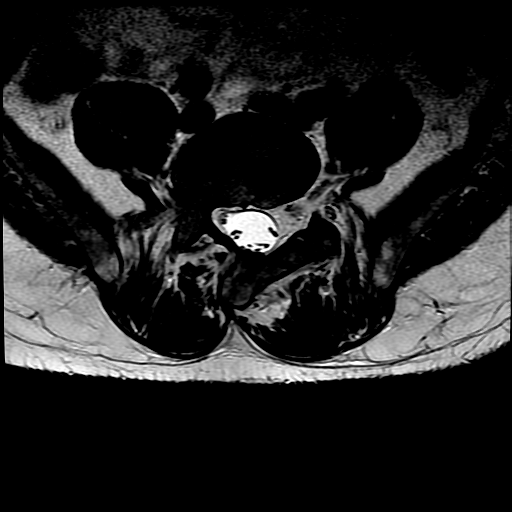
[im 18/35]
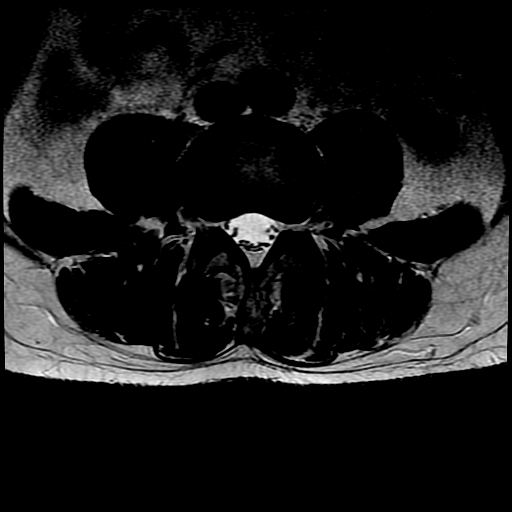
[im 30/35]
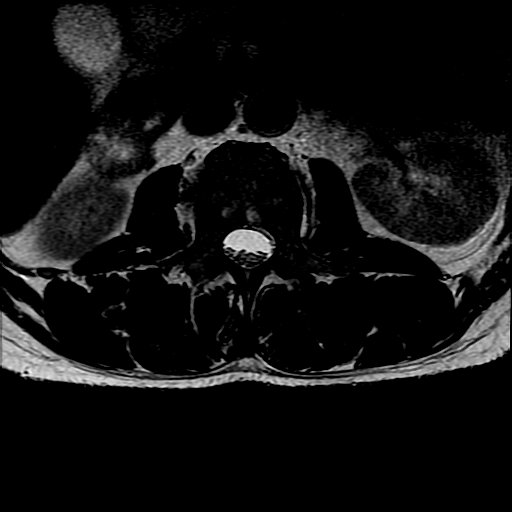

[Series 8: T1 · axial · 4.0mm · 0.39mm/px · z∈[-133,-3]mm · 3 of 35 slices shown (2 of 2)]
[im 5/35]
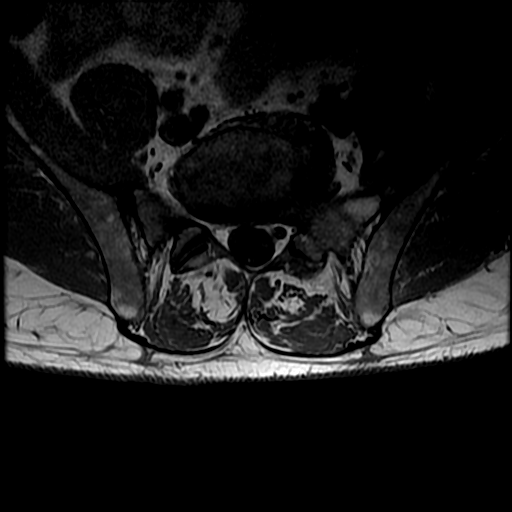
[im 18/35]
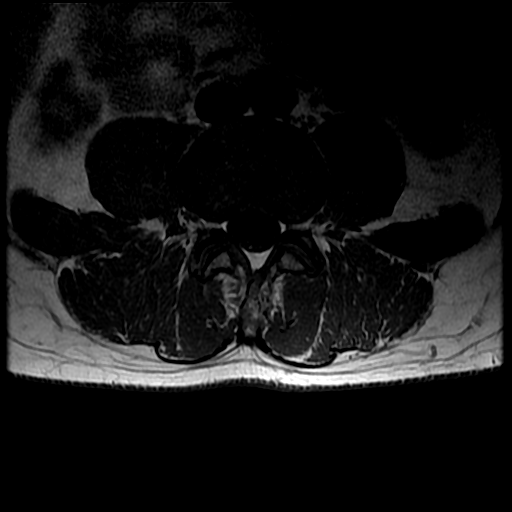
[im 30/35]
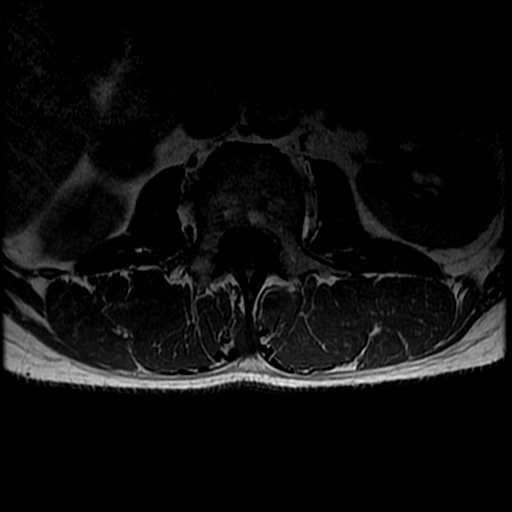

[17 of 48 positions shown; findings below may reference images not displayed]

FINDINGS: For the purposes of this dictation, the lowest well-formed
intervertebral disc spaces presumed to be the L5-S1 level, and there
presumed to be 5 lumbar type vertebral bodies.

Very mild left convex scoliosis centered at the level of L3.
Vertebral bodies otherwise normally aligned with preservation of the
normal lumbar lordosis. Vertebral body heights maintained. No
fracture. Signal intensity within the vertebral body bone marrow is
somewhat patchy and heterogeneous in appearance without focal
osseous lesion.

Conus medullaris terminates normally at the L1-2 level. Signal
intensity within the visualized cord is normal. Nerve roots of the
cauda equina within normal limits.

Paraspinous soft tissues demonstrate no acute abnormality.

L1-2:  Negative.

L2-3: Mild diffuse annular disc bulge with disc desiccation. No
focal disc protrusion. No significant canal or foraminal stenosis.

L3-4: No disc bulge or disc protrusion. Mild facet and ligamentum
flavum hypertrophy. Asymmetric fluid within the left L3-4 facet. No
significant canal stenosis. Mild bilateral foraminal narrowing due
to short pedicles and mild facet hypertrophy.

L4-5: No disc bulge or disc protrusion. The right L4 lamina is
hypoplastic. There is compensatory enlargement/hypertrophy of the
left L4 lamina with associated left-sided chronic pars defect
(series 5, image 11). The L4 spinous process is somewhat dysmorphic
in appearance, an angulated to the right. The L4 pedicles are
symmetric and normal in appearance. As a result, the left L4-5 facet
is dysplastic in appearance. No significant canal stenosis. L4
neural foramina are patent.

L5-S1: Degenerative disc bulge with disc desiccation and
intervertebral disc space narrowing. Disc bulging and is eccentric
to the left. No focal disc herniation. There is congenital absence
of the left L5 pedicle with a common left L4 and L5 neural foramen
(series 5, image 12). No significant left-sided foraminal stenosis,
although the bulging L5 disc does closely approximate the exiting L5
nerve root (series 8, image 30). The left L5 lamina appears to be
absent as well. There is associated bilateral facet arthrosis, left
greater than right. Mild to moderate right-sided foraminal narrowing
present.
IMPRESSION: 1. No acute abnormality within the lumbar spine. No evidence for
cord compression or significant stenosis.
2. Congenital hypoplasia of the right L4 lamina with associated
compensatory hypertrophy of the left L4 lamina with associated
chronic left L4 pars defect. No associated spondylolisthesis. The L4
spinous process is dysplastic.
3. Congenital absence of the left L5 pedicle with common left L4 and
L5 neural foramen. Left L5 lamina absent as well. There is
associated left greater than right facet arthrosis at this level.
4. Degenerative disc bulge at L5-S1, eccentric to the left with
associated endplate spurring, closely approximating the left far
lateral L5 nerve root. Mild to moderate right foraminal narrowing at
this level as well.
5. Heterogeneous bone marrow signal intensity. While this can be
caused by a marrow infiltrative processes, this is most commonly
resultant from anemia, smoking, obesity, or advancing age.

## 2017-10-05 DIAGNOSIS — I63511 Cerebral infarction due to unspecified occlusion or stenosis of right middle cerebral artery: Secondary | ICD-10-CM | POA: Diagnosis not present

## 2017-10-06 DIAGNOSIS — I63511 Cerebral infarction due to unspecified occlusion or stenosis of right middle cerebral artery: Secondary | ICD-10-CM | POA: Diagnosis not present

## 2017-10-07 DIAGNOSIS — I63511 Cerebral infarction due to unspecified occlusion or stenosis of right middle cerebral artery: Secondary | ICD-10-CM | POA: Diagnosis not present

## 2017-10-08 DIAGNOSIS — I63511 Cerebral infarction due to unspecified occlusion or stenosis of right middle cerebral artery: Secondary | ICD-10-CM | POA: Diagnosis not present

## 2017-10-09 DIAGNOSIS — I63511 Cerebral infarction due to unspecified occlusion or stenosis of right middle cerebral artery: Secondary | ICD-10-CM | POA: Diagnosis not present

## 2017-10-09 NOTE — Telephone Encounter (Signed)
PA was sent, called to follow up with patient to see if he had heard from insurance. Per patient he has been taking Entresto but he still had samples. Spoke with [facility] and the have not been filling Entresto secondary to PA needing to be obtained and no word from [employer] approved. Pharmacy did run Rx through and covered by insurance. Patient has appointment 10/13/17 with Harriet Pho DNP. Will forward to her so she can discuss with patient at follow up

## 2017-10-10 DIAGNOSIS — I63511 Cerebral infarction due to unspecified occlusion or stenosis of right middle cerebral artery: Secondary | ICD-10-CM | POA: Diagnosis not present
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
## 2017-10-10 NOTE — Telephone Encounter (Signed)
Thank you :)

## 2017-10-11 DIAGNOSIS — I63511 Cerebral infarction due to unspecified occlusion or stenosis of right middle cerebral artery: Secondary | ICD-10-CM | POA: Diagnosis not present

## 2017-10-12 DIAGNOSIS — I63511 Cerebral infarction due to unspecified occlusion or stenosis of right middle cerebral artery: Secondary | ICD-10-CM | POA: Diagnosis not present

## 2017-10-13 ENCOUNTER — Encounter: Payer: Self-pay | Admitting: Adult Health

## 2017-10-13 ENCOUNTER — Ambulatory Visit (INDEPENDENT_AMBULATORY_CARE_PROVIDER_SITE_OTHER): Payer: Medicaid Other | Admitting: Adult Health

## 2017-10-13 VITALS — BP 144/100 | HR 87 | Ht 68.0 in | Wt 146.4 lb

## 2017-10-13 DIAGNOSIS — Z79899 Other long term (current) drug therapy: Secondary | ICD-10-CM

## 2017-10-13 DIAGNOSIS — I43 Cardiomyopathy in diseases classified elsewhere: Secondary | ICD-10-CM

## 2017-10-13 DIAGNOSIS — I63511 Cerebral infarction due to unspecified occlusion or stenosis of right middle cerebral artery: Secondary | ICD-10-CM | POA: Diagnosis not present

## 2017-10-13 DIAGNOSIS — I6389 Other cerebral infarction: Secondary | ICD-10-CM

## 2017-10-13 DIAGNOSIS — I10 Essential (primary) hypertension: Secondary | ICD-10-CM

## 2017-10-13 DIAGNOSIS — I5042 Chronic combined systolic (congestive) and diastolic (congestive) heart failure: Secondary | ICD-10-CM | POA: Diagnosis not present

## 2017-10-13 LAB — BASIC METABOLIC PANEL
BUN / CREAT RATIO: 15 (ref 9–20)
BUN: 14 mg/dL (ref 6–24)
CHLORIDE: 100 mmol/L (ref 96–106)
CO2: 25 mmol/L (ref 20–29)
Calcium: 10.1 mg/dL (ref 8.7–10.2)
Creatinine, Ser: 0.95 mg/dL (ref 0.76–1.27)
GFR calc Af Amer: 106 mL/min/{1.73_m2} (ref >59)
GFR calc non Af Amer: 92 mL/min/{1.73_m2} (ref >59)
Glucose: 91 mg/dL (ref 65–99)
POTASSIUM: 4.1 mmol/L (ref 3.5–5.2)
SODIUM: 141 mmol/L (ref 134–144)

## 2017-10-13 MED ORDER — FUROSEMIDE 40 MG PO TABS: 40.0000 mg | ORAL_TABLET | Freq: Two times a day (BID) | ORAL | 2 refills | Status: DC

## 2017-10-13 MED ORDER — CARVEDILOL 3.125 MG PO TABS: 3.1250 mg | ORAL_TABLET | Freq: Two times a day (BID) | ORAL | 3 refills | Status: DC

## 2017-10-13 MED ORDER — SPIRONOLACTONE 25 MG PO TABS: 25.0000 mg | ORAL_TABLET | Freq: Two times a day (BID) | ORAL | 2 refills | Status: DC

## 2017-10-13 MED FILL — CARVEDILOL 3.125 MG TABLET: 3.125 | 30 days supply | Qty: 60 | Fill #0

## 2017-10-13 MED FILL — FUROSEMIDE 40 MG TAB: 40 | 30 days supply | Qty: 60 | Fill #0

## 2017-10-13 MED FILL — SPIRONOLACTONE 25 MG TABLET: 25 | 30 days supply | Qty: 60 | Fill #0

## 2017-10-13 NOTE — Patient Instructions (Signed)
Medication Instructions:  START CARVEDILOL 3.125MG  TWICE DAILY  If you need a refill on your cardiac medications before your next appointment, please call your pharmacy.  Labwork: BMET TODDAY HERE IN OUR OFFICE AT [facility]  Take the provided lab slips with you to the lab for your blood draw.   Special Instructions: FLUID RESTRICTION IS 1.5 LITERS (51OZ)  MAKE SURE TO TAKE YOUR MEDICATIONS BEFORE YOUR FOLLOW UP APPT  Follow-Up: Your physician wants you to follow-up in: 2 WEEKS IN THE AFTERNOON WITH KATHRYN LAWRENCE DNP(NURSE PRACTITIONER),AACC IF PRIMARY CARDIOLOGIST IS UNAVAILABLE.    Thank you for choosing [facility] at [facility]!!

## 2017-10-14 DIAGNOSIS — I63511 Cerebral infarction due to unspecified occlusion or stenosis of right middle cerebral artery: Secondary | ICD-10-CM | POA: Diagnosis not present

## 2017-10-15 DIAGNOSIS — I63511 Cerebral infarction due to unspecified occlusion or stenosis of right middle cerebral artery: Secondary | ICD-10-CM | POA: Diagnosis not present

## 2017-10-16 DIAGNOSIS — I63511 Cerebral infarction due to unspecified occlusion or stenosis of right middle cerebral artery: Secondary | ICD-10-CM | POA: Diagnosis not present

## 2017-10-17 DIAGNOSIS — I63511 Cerebral infarction due to unspecified occlusion or stenosis of right middle cerebral artery: Secondary | ICD-10-CM | POA: Diagnosis not present

## 2017-10-18 DIAGNOSIS — I63511 Cerebral infarction due to unspecified occlusion or stenosis of right middle cerebral artery: Secondary | ICD-10-CM | POA: Diagnosis not present

## 2017-10-19 DIAGNOSIS — I63511 Cerebral infarction due to unspecified occlusion or stenosis of right middle cerebral artery: Secondary | ICD-10-CM | POA: Diagnosis not present

## 2017-10-20 ENCOUNTER — Other Ambulatory Visit: Payer: Self-pay | Admitting: Family Medicine

## 2017-10-20 DIAGNOSIS — R059 Cough, unspecified: Secondary | ICD-10-CM

## 2017-10-20 DIAGNOSIS — I63511 Cerebral infarction due to unspecified occlusion or stenosis of right middle cerebral artery: Secondary | ICD-10-CM | POA: Diagnosis not present

## 2017-10-20 DIAGNOSIS — R05 Cough: Secondary | ICD-10-CM

## 2017-10-20 MED FILL — PROAIR HFA 90 MCG INHALER: 108 (90 BAS | 25 days supply | Qty: 9 | Fill #0

## 2017-10-21 DIAGNOSIS — I63511 Cerebral infarction due to unspecified occlusion or stenosis of right middle cerebral artery: Secondary | ICD-10-CM | POA: Diagnosis not present

## 2017-10-22 DIAGNOSIS — I63511 Cerebral infarction due to unspecified occlusion or stenosis of right middle cerebral artery: Secondary | ICD-10-CM | POA: Diagnosis not present

## 2017-10-23 DIAGNOSIS — I63511 Cerebral infarction due to unspecified occlusion or stenosis of right middle cerebral artery: Secondary | ICD-10-CM | POA: Diagnosis not present

## 2017-10-24 DIAGNOSIS — I63511 Cerebral infarction due to unspecified occlusion or stenosis of right middle cerebral artery: Secondary | ICD-10-CM | POA: Diagnosis not present

## 2017-10-25 DIAGNOSIS — I63511 Cerebral infarction due to unspecified occlusion or stenosis of right middle cerebral artery: Secondary | ICD-10-CM | POA: Diagnosis not present

## 2017-10-26 DIAGNOSIS — I63511 Cerebral infarction due to unspecified occlusion or stenosis of right middle cerebral artery: Secondary | ICD-10-CM | POA: Diagnosis not present
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
## 2017-10-27 ENCOUNTER — Ambulatory Visit: Payer: Medicaid Other | Admitting: Adult Health

## 2017-10-27 DIAGNOSIS — I63511 Cerebral infarction due to unspecified occlusion or stenosis of right middle cerebral artery: Secondary | ICD-10-CM | POA: Diagnosis not present

## 2017-10-28 DIAGNOSIS — I63511 Cerebral infarction due to unspecified occlusion or stenosis of right middle cerebral artery: Secondary | ICD-10-CM | POA: Diagnosis not present

## 2017-10-29 DIAGNOSIS — I63511 Cerebral infarction due to unspecified occlusion or stenosis of right middle cerebral artery: Secondary | ICD-10-CM | POA: Diagnosis not present

## 2017-10-30 DIAGNOSIS — I63511 Cerebral infarction due to unspecified occlusion or stenosis of right middle cerebral artery: Secondary | ICD-10-CM | POA: Diagnosis not present
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
## 2017-11-02 ENCOUNTER — Encounter: Payer: Self-pay | Admitting: Adult Health

## 2017-11-02 ENCOUNTER — Ambulatory Visit (INDEPENDENT_AMBULATORY_CARE_PROVIDER_SITE_OTHER): Payer: Medicaid Other | Admitting: Adult Health

## 2017-11-02 VITALS — BP 118/88 | HR 76 | Ht 68.0 in | Wt 147.0 lb

## 2017-11-02 DIAGNOSIS — I519 Heart disease, unspecified: Secondary | ICD-10-CM

## 2017-11-02 DIAGNOSIS — I10 Essential (primary) hypertension: Secondary | ICD-10-CM | POA: Diagnosis not present

## 2017-11-02 DIAGNOSIS — I43 Cardiomyopathy in diseases classified elsewhere: Secondary | ICD-10-CM | POA: Diagnosis not present

## 2017-11-02 DIAGNOSIS — I63511 Cerebral infarction due to unspecified occlusion or stenosis of right middle cerebral artery: Secondary | ICD-10-CM | POA: Diagnosis not present

## 2017-11-02 DIAGNOSIS — Z79899 Other long term (current) drug therapy: Secondary | ICD-10-CM | POA: Diagnosis not present

## 2017-11-02 LAB — BASIC METABOLIC PANEL
BUN / CREAT RATIO: 17 (ref 9–20)
BUN: 17 mg/dL (ref 6–24)
CALCIUM: 9.9 mg/dL (ref 8.7–10.2)
CHLORIDE: 99 mmol/L (ref 96–106)
CO2: 22 mmol/L (ref 20–29)
Creatinine, Ser: 0.98 mg/dL (ref 0.76–1.27)
GFR calc non Af Amer: 88 mL/min/{1.73_m2} (ref >59)
GFR, EST AFRICAN AMERICAN: 102 mL/min/{1.73_m2} (ref >59)
GLUCOSE: 86 mg/dL (ref 65–99)
Potassium: 4.1 mmol/L (ref 3.5–5.2)
Sodium: 138 mmol/L (ref 134–144)

## 2017-11-02 MED ORDER — SACUBITRIL-VALSARTAN 49-51 MG PO TABS: 1.0000 | ORAL_TABLET | Freq: Two times a day (BID) | ORAL | 5 refills | Status: DC

## 2017-11-02 MED ORDER — MAGNESIUM OXIDE 400 MG PO TABS: 400.0000 mg | ORAL_TABLET | Freq: Every day | ORAL | 5 refills | Status: DC

## 2017-11-02 NOTE — Patient Instructions (Addendum)
Medication Instructions:  NO CHANGES- Your physician recommends that you continue on your current medications as directed. Please refer to the Current Medication list given to you today.  If you need a refill on your cardiac medications before your next appointment, please call your pharmacy.  Labwork: BMET TODAY HERE IN OUR OFFICE AT [facility]  Take the provided lab slips with you to the lab for your blood draw.  If you have labs (blood work) drawn today and your tests are completely normal, you will receive your results only by: Marland Kitchen MyChart Message (if you have MyChart) OR . A paper copy in the mail If you have any lab test that is abnormal or we need to change your treatment, we will call you to review the results.  Testing/Procedures: Echocardiogram - Your physician has requested that you have an echocardiogram. Echocardiography is a painless test that uses sound waves to create images of your heart. It provides your doctor with information about the size and shape of your heart and how well your heart's chambers and valves are working. This procedure takes approximately one hour. There are no restrictions for this procedure. This will be performed at our [facility] location - [address], [address].  Follow-Up: You will need a follow up appointment in 6 WEEKS (AFTER ECHO).  You may see  DR Gabrielle Dare, DNP, ANP or one of the Advanced Practice Providers on your designated Care Team At [facility], you and your health needs are our priority.  As part of our continuing mission to provide you with exceptional heart care, we have created designated Provider Care Teams.  These Care Teams include your primary Cardiologist (physician) and Advanced Practice Providers (APPs -  Physician Assistants and Nurse Practitioners) who all work together to provide you with the care you need, when you need it.  Thank you for choosing [facility] at [facility]!!

## 2017-11-03 DIAGNOSIS — I63511 Cerebral infarction due to unspecified occlusion or stenosis of right middle cerebral artery: Secondary | ICD-10-CM | POA: Diagnosis not present

## 2017-11-04 DIAGNOSIS — I63511 Cerebral infarction due to unspecified occlusion or stenosis of right middle cerebral artery: Secondary | ICD-10-CM | POA: Diagnosis not present

## 2017-11-05 ENCOUNTER — Ambulatory Visit: Payer: Medicaid Other | Admitting: Family Medicine

## 2017-11-06 DIAGNOSIS — I63511 Cerebral infarction due to unspecified occlusion or stenosis of right middle cerebral artery: Secondary | ICD-10-CM | POA: Diagnosis not present

## 2017-11-09 DIAGNOSIS — I63511 Cerebral infarction due to unspecified occlusion or stenosis of right middle cerebral artery: Secondary | ICD-10-CM | POA: Diagnosis not present

## 2017-11-10 DIAGNOSIS — I63511 Cerebral infarction due to unspecified occlusion or stenosis of right middle cerebral artery: Secondary | ICD-10-CM | POA: Diagnosis not present

## 2017-11-11 ENCOUNTER — Other Ambulatory Visit: Payer: Self-pay

## 2017-11-11 ENCOUNTER — Ambulatory Visit (HOSPITAL_COMMUNITY): Payer: Medicaid Other | Attending: Cardiology

## 2017-11-11 DIAGNOSIS — I63511 Cerebral infarction due to unspecified occlusion or stenosis of right middle cerebral artery: Secondary | ICD-10-CM | POA: Diagnosis not present

## 2017-11-11 DIAGNOSIS — I519 Heart disease, unspecified: Secondary | ICD-10-CM | POA: Diagnosis not present

## 2017-11-11 MED ORDER — PERFLUTREN LIPID MICROSPHERE
1.0000 mL | INTRAVENOUS | Status: AC | PRN
  Administered 2017-11-11: 3 mL via INTRAVENOUS

## 2017-11-12 DIAGNOSIS — I63511 Cerebral infarction due to unspecified occlusion or stenosis of right middle cerebral artery: Secondary | ICD-10-CM | POA: Diagnosis not present

## 2017-11-13 DIAGNOSIS — I63511 Cerebral infarction due to unspecified occlusion or stenosis of right middle cerebral artery: Secondary | ICD-10-CM | POA: Diagnosis not present

## 2017-11-17 DIAGNOSIS — I63511 Cerebral infarction due to unspecified occlusion or stenosis of right middle cerebral artery: Secondary | ICD-10-CM | POA: Diagnosis not present

## 2017-11-18 DIAGNOSIS — I63511 Cerebral infarction due to unspecified occlusion or stenosis of right middle cerebral artery: Secondary | ICD-10-CM | POA: Diagnosis not present

## 2017-11-19 DIAGNOSIS — I63511 Cerebral infarction due to unspecified occlusion or stenosis of right middle cerebral artery: Secondary | ICD-10-CM | POA: Diagnosis not present

## 2017-11-20 DIAGNOSIS — I63511 Cerebral infarction due to unspecified occlusion or stenosis of right middle cerebral artery: Secondary | ICD-10-CM | POA: Diagnosis not present

## 2017-11-23 DIAGNOSIS — I63511 Cerebral infarction due to unspecified occlusion or stenosis of right middle cerebral artery: Secondary | ICD-10-CM | POA: Diagnosis not present

## 2017-11-24 DIAGNOSIS — I63511 Cerebral infarction due to unspecified occlusion or stenosis of right middle cerebral artery: Secondary | ICD-10-CM | POA: Diagnosis not present

## 2017-11-25 DIAGNOSIS — I63511 Cerebral infarction due to unspecified occlusion or stenosis of right middle cerebral artery: Secondary | ICD-10-CM | POA: Diagnosis not present

## 2017-11-26 DIAGNOSIS — I63511 Cerebral infarction due to unspecified occlusion or stenosis of right middle cerebral artery: Secondary | ICD-10-CM | POA: Diagnosis not present

## 2017-11-27 DIAGNOSIS — I63511 Cerebral infarction due to unspecified occlusion or stenosis of right middle cerebral artery: Secondary | ICD-10-CM | POA: Diagnosis not present

## 2017-11-30 ENCOUNTER — Ambulatory Visit: Payer: Medicaid Other | Admitting: Family Medicine

## 2017-11-30 DIAGNOSIS — I63511 Cerebral infarction due to unspecified occlusion or stenosis of right middle cerebral artery: Secondary | ICD-10-CM | POA: Diagnosis not present

## 2017-12-01 DIAGNOSIS — I63511 Cerebral infarction due to unspecified occlusion or stenosis of right middle cerebral artery: Secondary | ICD-10-CM | POA: Diagnosis not present

## 2017-12-02 DIAGNOSIS — I63511 Cerebral infarction due to unspecified occlusion or stenosis of right middle cerebral artery: Secondary | ICD-10-CM | POA: Diagnosis not present

## 2017-12-03 DIAGNOSIS — I63511 Cerebral infarction due to unspecified occlusion or stenosis of right middle cerebral artery: Secondary | ICD-10-CM | POA: Diagnosis not present

## 2017-12-04 DIAGNOSIS — I63511 Cerebral infarction due to unspecified occlusion or stenosis of right middle cerebral artery: Secondary | ICD-10-CM | POA: Diagnosis not present

## 2017-12-07 DIAGNOSIS — I63511 Cerebral infarction due to unspecified occlusion or stenosis of right middle cerebral artery: Secondary | ICD-10-CM | POA: Diagnosis not present

## 2017-12-08 DIAGNOSIS — I63511 Cerebral infarction due to unspecified occlusion or stenosis of right middle cerebral artery: Secondary | ICD-10-CM | POA: Diagnosis not present

## 2017-12-09 ENCOUNTER — Ambulatory Visit: Payer: Medicaid Other | Attending: Family Medicine | Admitting: Family Medicine

## 2017-12-09 ENCOUNTER — Encounter: Payer: Self-pay | Admitting: Family Medicine

## 2017-12-09 VITALS — BP 149/88 | HR 73 | Temp 97.9°F | Ht 68.0 in | Wt 149.2 lb

## 2017-12-09 DIAGNOSIS — I10 Essential (primary) hypertension: Secondary | ICD-10-CM

## 2017-12-09 DIAGNOSIS — I69349 Monoplegia of lower limb following cerebral infarction affecting unspecified side: Secondary | ICD-10-CM | POA: Insufficient documentation

## 2017-12-09 DIAGNOSIS — I110 Hypertensive heart disease with heart failure: Secondary | ICD-10-CM | POA: Insufficient documentation

## 2017-12-09 DIAGNOSIS — G4700 Insomnia, unspecified: Secondary | ICD-10-CM | POA: Insufficient documentation

## 2017-12-09 DIAGNOSIS — F1010 Alcohol abuse, uncomplicated: Secondary | ICD-10-CM | POA: Insufficient documentation

## 2017-12-09 DIAGNOSIS — Z7982 Long term (current) use of aspirin: Secondary | ICD-10-CM | POA: Diagnosis not present

## 2017-12-09 DIAGNOSIS — J438 Other emphysema: Secondary | ICD-10-CM | POA: Diagnosis not present

## 2017-12-09 DIAGNOSIS — I63511 Cerebral infarction due to unspecified occlusion or stenosis of right middle cerebral artery: Secondary | ICD-10-CM | POA: Diagnosis not present

## 2017-12-09 DIAGNOSIS — F17210 Nicotine dependence, cigarettes, uncomplicated: Secondary | ICD-10-CM | POA: Diagnosis not present

## 2017-12-09 DIAGNOSIS — Z720 Tobacco use: Secondary | ICD-10-CM

## 2017-12-09 DIAGNOSIS — Z880 Allergy status to penicillin: Secondary | ICD-10-CM | POA: Insufficient documentation

## 2017-12-09 DIAGNOSIS — J449 Chronic obstructive pulmonary disease, unspecified: Secondary | ICD-10-CM | POA: Diagnosis not present

## 2017-12-09 DIAGNOSIS — Z716 Tobacco abuse counseling: Secondary | ICD-10-CM | POA: Insufficient documentation

## 2017-12-09 DIAGNOSIS — I5042 Chronic combined systolic (congestive) and diastolic (congestive) heart failure: Secondary | ICD-10-CM | POA: Diagnosis not present

## 2017-12-09 MED ORDER — TIOTROPIUM BROMIDE MONOHYDRATE 18 MCG IN CAPS: 18.0000 ug | ORAL_CAPSULE | Freq: Every day | RESPIRATORY_TRACT | 6 refills | Status: DC
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
## 2017-12-09 NOTE — Patient Instructions (Signed)
Steps to Quit Smoking Smoking tobacco can be bad for your health. It can also affect almost every organ in your body. Smoking puts you and people around you at risk for many serious long-lasting (chronic) diseases. Quitting smoking is hard, but it is one of the best things that you can do for your health. It is never too late to quit. What are the benefits of quitting smoking? When you quit smoking, you lower your risk for getting serious diseases and conditions. They can include:  Lung cancer or lung disease.  Heart disease.  Stroke.  Heart attack.  Not being able to have children (infertility).  Weak bones (osteoporosis) and broken bones (fractures).  If you have coughing, wheezing, and shortness of breath, those symptoms may get better when you quit. You may also get sick less often. If you are pregnant, quitting smoking can help to lower your chances of having a baby of low birth weight. What can I do to help me quit smoking? Talk with your doctor about what can help you quit smoking. Some things you can do (strategies) include:  Quitting smoking totally, instead of slowly cutting back how much you smoke over a period of time.  Going to in-person counseling. You are more likely to quit if you go to many counseling sessions.  Using resources and support systems, such as: ? Online chats with a counselor. ? Phone quitlines. ? Printed self-help materials. ? Support groups or group counseling. ? Text messaging programs. ? Mobile phone apps or applications.  Taking medicines. Some of these medicines may have nicotine in them. If you are pregnant or breastfeeding, do not take any medicines to quit smoking unless your doctor says it is okay. Talk with your doctor about counseling or other things that can help you.  Talk with your doctor about using more than one strategy at the same time, such as taking medicines while you are also going to in-person counseling. This can help make  quitting easier. What things can I do to make it easier to quit? Quitting smoking might feel very hard at first, but there is a lot that you can do to make it easier. Take these steps:  Talk to your family and friends. Ask them to support and encourage you.  Call phone quitlines, reach out to support groups, or work with a counselor.  Ask people who smoke to not smoke around you.  Avoid places that make you want (trigger) to smoke, such as: ? Bars. ? Parties. ? Smoke-break areas at work.  Spend time with people who do not smoke.  Lower the stress in your life. Stress can make you want to smoke. Try these things to help your stress: ? Getting regular exercise. ? Deep-breathing exercises. ? Yoga. ? Meditating. ? Doing a body scan. To do this, close your eyes, focus on one area of your body at a time from head to toe, and notice which parts of your body are tense. Try to relax the muscles in those areas.  Download or buy apps on your mobile phone or tablet that can help you stick to your quit plan. There are many free apps, such as QuitGuide from the CDC (Centers for Disease Control and Prevention). You can find more support from smokefree.gov and other websites.  This information is not intended to replace advice given to you by your health care provider. Make sure you discuss any questions you have with your health care provider. Document Released: 11/02/2008 Document   Revised: 09/04/2015 Document Reviewed: 05/23/2014 Elsevier Interactive Patient Education  2018 Elsevier Inc.  

## 2017-12-10 DIAGNOSIS — I63511 Cerebral infarction due to unspecified occlusion or stenosis of right middle cerebral artery: Secondary | ICD-10-CM | POA: Diagnosis not present

## 2017-12-11 DIAGNOSIS — I63511 Cerebral infarction due to unspecified occlusion or stenosis of right middle cerebral artery: Secondary | ICD-10-CM | POA: Diagnosis not present

## 2017-12-14 ENCOUNTER — Ambulatory Visit (INDEPENDENT_AMBULATORY_CARE_PROVIDER_SITE_OTHER): Payer: Medicaid Other | Admitting: Cardiovascular Disease

## 2017-12-14 ENCOUNTER — Ambulatory Visit: Payer: Medicaid Other | Admitting: Cardiovascular Disease

## 2017-12-14 ENCOUNTER — Encounter: Payer: Self-pay | Admitting: Cardiovascular Disease

## 2017-12-14 VITALS — BP 142/80 | HR 77 | Ht 68.0 in | Wt 149.8 lb

## 2017-12-14 DIAGNOSIS — I428 Other cardiomyopathies: Secondary | ICD-10-CM

## 2017-12-14 DIAGNOSIS — I519 Heart disease, unspecified: Secondary | ICD-10-CM | POA: Diagnosis not present

## 2017-12-14 DIAGNOSIS — I63511 Cerebral infarction due to unspecified occlusion or stenosis of right middle cerebral artery: Secondary | ICD-10-CM | POA: Diagnosis not present

## 2017-12-14 DIAGNOSIS — I10 Essential (primary) hypertension: Secondary | ICD-10-CM

## 2017-12-14 DIAGNOSIS — I5042 Chronic combined systolic (congestive) and diastolic (congestive) heart failure: Secondary | ICD-10-CM | POA: Diagnosis not present

## 2017-12-14 MED ORDER — NEBIVOLOL HCL 5 MG PO TABS: 5.0000 mg | ORAL_TABLET | Freq: Every day | ORAL | 5 refills | Status: DC

## 2017-12-14 NOTE — Patient Instructions (Addendum)
Medication Instructions:  START BYSTOLIC 5 MG DAILY   STOP CARVEDILOL  If you need a refill on your cardiac medications before your next appointment, please call your pharmacy.   Lab work: NONE  Testing/Procedures: NONE  Follow-Up: At [facility], you and your health needs are our priority.  As part of our continuing mission to provide you with exceptional heart care, we have created designated Provider Care Teams.  These Care Teams include your primary Cardiologist (physician) and Advanced Practice Providers (APPs -  Physician Assistants and Nurse Practitioners) who all work together to provide you with the care you need, when you need it. You will need a follow up appointment in 3 months. You may see Chilton Si, MD or one of the following Advanced Practice Providers on your designated Care Team:   Corine Shelter, PA-C Judy Pimple, [place] . Marjie Skiff, PA-C  You have been referred to ELECTROPHYSIOLOGIST  [facility] AT [address] WILL CALL YOU WITH APPOINTMENT DATE AND TIME
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
## 2017-12-15 DIAGNOSIS — I63511 Cerebral infarction due to unspecified occlusion or stenosis of right middle cerebral artery: Secondary | ICD-10-CM | POA: Diagnosis not present

## 2017-12-16 DIAGNOSIS — I63511 Cerebral infarction due to unspecified occlusion or stenosis of right middle cerebral artery: Secondary | ICD-10-CM | POA: Diagnosis not present

## 2017-12-17 DIAGNOSIS — I63511 Cerebral infarction due to unspecified occlusion or stenosis of right middle cerebral artery: Secondary | ICD-10-CM | POA: Diagnosis not present

## 2017-12-18 DIAGNOSIS — I63511 Cerebral infarction due to unspecified occlusion or stenosis of right middle cerebral artery: Secondary | ICD-10-CM | POA: Diagnosis not present

## 2017-12-21 DIAGNOSIS — I63511 Cerebral infarction due to unspecified occlusion or stenosis of right middle cerebral artery: Secondary | ICD-10-CM | POA: Diagnosis not present

## 2017-12-22 DIAGNOSIS — I63511 Cerebral infarction due to unspecified occlusion or stenosis of right middle cerebral artery: Secondary | ICD-10-CM | POA: Diagnosis not present

## 2017-12-23 ENCOUNTER — Ambulatory Visit (INDEPENDENT_AMBULATORY_CARE_PROVIDER_SITE_OTHER): Payer: Medicaid Other | Admitting: Cardiology

## 2017-12-23 ENCOUNTER — Encounter: Payer: Self-pay | Admitting: Cardiology

## 2017-12-23 VITALS — BP 162/118 | HR 84 | Ht 68.0 in | Wt 152.2 lb

## 2017-12-23 DIAGNOSIS — I10 Essential (primary) hypertension: Secondary | ICD-10-CM

## 2017-12-23 DIAGNOSIS — I5022 Chronic systolic (congestive) heart failure: Secondary | ICD-10-CM

## 2017-12-23 DIAGNOSIS — I428 Other cardiomyopathies: Secondary | ICD-10-CM | POA: Diagnosis not present

## 2017-12-23 DIAGNOSIS — G4733 Obstructive sleep apnea (adult) (pediatric): Secondary | ICD-10-CM | POA: Diagnosis not present

## 2017-12-23 DIAGNOSIS — I63511 Cerebral infarction due to unspecified occlusion or stenosis of right middle cerebral artery: Secondary | ICD-10-CM | POA: Diagnosis not present

## 2017-12-23 DIAGNOSIS — Z01812 Encounter for preprocedural laboratory examination: Secondary | ICD-10-CM

## 2017-12-23 LAB — CBC
Hematocrit: 39.2 % (ref 37.5–51.0)
Hemoglobin: 13.6 g/dL (ref 13.0–17.7)
MCH: 35.3 pg — ABNORMAL HIGH (ref 26.6–33.0)
MCHC: 34.7 g/dL (ref 31.5–35.7)
MCV: 102 fL — ABNORMAL HIGH (ref 79–97)
Platelets: 184 10*3/uL (ref 150–450)
RBC: 3.85 x10E6/uL — ABNORMAL LOW (ref 4.14–5.80)
RDW: 13.2 % (ref 12.3–15.4)
WBC: 4 10*3/uL (ref 3.4–10.8)

## 2017-12-23 LAB — BASIC METABOLIC PANEL
BUN/Creatinine Ratio: 9 (ref 9–20)
BUN: 7 mg/dL (ref 6–24)
CO2: 23 mmol/L (ref 20–29)
CREATININE: 0.81 mg/dL (ref 0.76–1.27)
Calcium: 9.6 mg/dL (ref 8.7–10.2)
Chloride: 102 mmol/L (ref 96–106)
GFR calc Af Amer: 118 mL/min/{1.73_m2} (ref >59)
GFR calc non Af Amer: 102 mL/min/{1.73_m2} (ref >59)
Glucose: 79 mg/dL (ref 65–99)
Potassium: 4.2 mmol/L (ref 3.5–5.2)
Sodium: 139 mmol/L (ref 134–144)

## 2017-12-23 MED ORDER — SACUBITRIL-VALSARTAN 97-103 MG PO TABS: 1.0000 | ORAL_TABLET | Freq: Two times a day (BID) | ORAL | 6 refills | Status: DC

## 2017-12-23 MED FILL — BYSTOLIC 5 MG TABLET: 5 | 30 days supply | Qty: 30 | Fill #0

## 2017-12-23 MED FILL — ENTRESTO 97 MG-103 MG TAB: 97-103 | 30 days supply | Qty: 60 | Fill #0

## 2017-12-23 MED FILL — SPIRIVA 18 MCG CP-HANDIHALE: 18 | 30 days supply | Qty: 30 | Fill #0

## 2017-12-23 NOTE — Patient Instructions (Addendum)
Medication Instructions:  Your physician has recommended you make the following change in your medication:  1. INCREASE Entresto to 97/103 twice daily     * If you need a refill on your cardiac medications before your next appointment, please call your pharmacy. *  Labwork: Pre procedure lab work today: BMET & CBC * Will notify you of abnormal results, otherwise continue current treatment plan.*  Testing/Procedures: Your physician has recommended that you have a defibrillator inserted. An implantable cardioverter defibrillator (ICD) is a small device that is placed in your chest or, in rare cases, your abdomen. This device uses electrical pulses or shocks to help control life-threatening, irregular heartbeats that could lead the heart to suddenly stop beating (sudden cardiac arrest). Leads are attached to the ICD that goes into your heart. This is done in the hospital and usually requires an overnight stay. Please follow the instructions below, located under the special instructions section.   Follow-Up: Your physician recommends that you schedule a wound check appointment 10-14 days, after your procedure on 01/18/2018, with the device clinic.  Your physician recommends that you schedule a follow up appointment in 91 days, after your procedure on 01/18/2018, with Dr. Elberta Fortis.  * Please note that any paperwork needing to be filled out by the provider will need to be addressed at the front desk prior to seeing the provider.  Please note that any FMLA, disability or other documents regarding health condition is subject to a $25.00 charge that must be received prior to completion of paperwork in the form of a money order or check. *  Thank you for choosing [facility]!!   Dory Horn, RN [phone]   Any Other Special Instructions Will Be Listed Below (If Applicable).     Implantable Device Instructions  You are scheduled for:                  _____ Implantable Cardioverter  Defibrillator  on  01/18/2018  with Dr. Elberta Fortis.  1.   Please arrive at the [facility], Entrance "A"  at [facility] at  5:30 a.m. on the day of your procedure. (The address is [address])  2. Do not eat or drink after midnight the night before your procedure.  3.   Complete pre procedure  lab work on 12/23/17.  The lab at [facility] is open from 8:00 AM to 4:30 PM.  You do not have to be fasting.  4.   Hold all of your morning medications the morning of your procedure.  5.  Plan for an overnight stay.  Bring your insurance cards and a list of you medications.  6.  Wash your chest and neck with surgical scrub the evening before and the morning of  your procedure.  Rinse well. Please review the surgical scrub instruction sheet given to you.  7. Your chest will need to be shaved prior to this procedure (if needed). We ask that you do this yourself at home 1 to 2 days before or if uncomfortable/unable to do yourself, then it will be performed by the hospital staff the day of.                                                                                                                *  If you have ANY questions after you get home, please call Dory Horn, RN @ [phone].  * Every attempt is made to prevent procedures from being rescheduled.  Due to the nature of  Electrophysiology, rescheduling can happen.  The physician is always aware and directs the staff when this occurs.        Cardioverter Defibrillator Implantation An implantable cardioverter defibrillator (ICD) is a small, lightweight, battery-powered device that is placed (implanted) under the skin in the chest or abdomen. Your caregiver may prescribe an ICD if:  You have had an irregular heart rhythm (arrhythmia) that originated in the lower chambers of the heart (ventricles).  Your heart has been damaged by a disease (such as coronary artery disease) or heart condition (such as a  heart attack). An ICD consists of a battery that lasts several years, a small computer called a pulse generator, and wires called leads that go into the heart. It is used to detect and correct two dangerous arrhythmias: a rapid heart rhythm (tachycardia) and an arrhythmia in which the ventricles contract in an uncoordinated way (fibrillation). When an ICD detects tachycardia, it sends an electrical signal to the heart that restores the heartbeat to normal (cardioversion). This signal is usually painless. If cardioversion does not work or if the ICD detects fibrillation, it delivers a small electrical shock to the heart (defibrillation) to restart the heart. The shock may feel like a strong jolt in the chest. ICDs may be programmed to correct other problems. Sometimes, ICDs are programmed to act as another type of implantable device called a pacemaker. Pacemakers are used to treat a slow heartbeat (bradycardia). LET YOUR CAREGIVER KNOW ABOUT:  Any allergies you have.  All medicines you are taking, including vitamins, herbs, eyedrops, and over-the-counter medicines and creams.  Previous problems you or members of your family have had with the use of anesthetics.  Any blood disorders you have had.  Other health problems you have. RISKS AND COMPLICATIONS Generally, the procedure to implant an ICD is safe. However, as with any surgical procedure, complications can occur. Possible complications associated with implanting an ICD include:  Swelling, bleeding, or bruising at the site where the ICD was implanted.  Infection at the site where the ICD was implanted.  A reaction to medicine used during the procedure.  Nerve, heart, or blood vessel damage.  Blood clots. BEFORE THE PROCEDURE  You may need to have blood tests, heart tests, or a chest X-ray done before the day of the procedure.  Ask your caregiver about changing or stopping your regular medicines.  Make plans to have someone drive you  home. You may need to stay in the hospital overnight after the procedure.  Stop smoking at least 24 hours before the procedure.  Take a bath or shower the night before the procedure. You may need to scrub your chest or abdomen with a special type of soap.  Do not eat or drink before your procedure for as long as directed by your caregiver. Ask if it is okay to take any needed medicine with a small sip of water. PROCEDURE  The procedure to implant an ICD in your chest or abdomen is usually done at a hospital in a room that has a large X-ray machine called a fluoroscope. The machine will be above you during the procedure. It will help your caregiver see your heart during the procedure. Implanting an ICD usually takes 1-3 hours. Before the procedure:   Small monitors will be  put on your body. They will be used to check your heart, blood pressure, and oxygen level.  A needle will be put into a vein in your hand or arm. This is called an intravenous (IV) access tube. Fluids and medicine will flow directly into your body through the IV tube.  Your chest or abdomen will be cleaned with a germ-killing (antiseptic) solution. The area may be shaved.  You may be given medicine to help you relax (sedative).  You will be given a medicine called a local anesthetic. This medicine will make the surgical site numb while the ICD is implanted. You will be sleepy but awake during the procedure. After you are numb the procedure will begin. The caregiver will:  Make a small cut (incision). This will make a pocket deep under your skin that will hold the pulse generator.  Guide the leads through a large blood vessel into your heart and attach them to the heart muscles. Depending on the ICD, the leads may go into one ventricle or they may go to both ventricles and into an upper chamber of the heart (atrium).  Test the ICD.  Close the incision with stitches, glue, or staples. AFTER THE PROCEDURE  You may feel  pain. Some pain is normal. It may last a few days.  You may stay in a recovery area until the local anesthetic has worn off. Your blood pressure and pulse will be checked often. You will be taken to a room where your heart will be monitored.  A chest X-ray will be taken. This is done to check that the cardioverter defibrillator is in the right place.  You may stay in the hospital overnight.  A slight bump may be seen over the skin where the ICD was placed. Sometimes, it is possible to feel the ICD under the skin. This is normal.  In the months and years afterward, your caregiver will check the device, the leads, and the battery every few months. Eventually, when the battery is low, the ICD will be replaced.   This information is not intended to replace advice given to you by your health care provider. Make sure you discuss any questions you have with your health care provider.   Document Released: 09/28/2001 Document Revised: 10/27/2012 Document Reviewed: 01/26/2012 Elsevier Interactive Patient Education 2016 [employer]    Cardioverter Defibrillator Implantation, Care After This sheet gives you information about how to care for yourself after your procedure. Your health care provider may also give you more specific instructions. If you have problems or questions, contact your health care provider. What can I expect after the procedure? After the procedure, it is common to have:  Some pain. It may last a few days.  A slight bump over the skin where the device was placed. Sometimes, it is possible to feel the device under the skin. This is normal.  During the months and years after your procedure, your health care provider will check the device, the leads, and the battery every few months. Eventually, when the battery is low, the device will be replaced. Follow these instructions at home: Medicines  Take over-the-counter and prescription medicines only as told by your health care  provider.  If you were prescribed an antibiotic medicine, take it as told by your health care provider. Do not stop taking the antibiotic even if you start to feel better. Incision care   Follow instructions from your health care provider about how to take care of your incision  area. Make sure you: ? Wash your hands with soap and water before you change your bandage (dressing). If soap and water are not available, use hand sanitizer. ? Change your dressing as told by your health care provider. ? Leave stitches (sutures), skin glue, or adhesive strips in place. These skin closures may need to stay in place for 2 weeks or longer. If adhesive strip edges start to loosen and curl up, you may trim the loose edges. Do not remove adhesive strips completely unless your health care provider tells you to do that.  Check your incision area every day for signs of infection. Check for: ? More redness, swelling, or pain. ? More fluid or blood. ? Warmth. ? Pus or a bad smell.  Do not use lotions or ointments near the incision area unless told by your health care provider.  Keep the incision area clean and dry for 2-3 days after the procedure or for as long as told by your health care provider. It takes several weeks for the incision site to heal completely.  Do not take baths, swim, or use a hot tub until your health care provider approves. Activity  Try to walk a little every day. Exercising is important after this procedure. Also, use your shoulder on the side of the defibrillator in daily tasks that do not require a lot of motion.  For at least 6 weeks: ? Do not lift your upper arm above your shoulders. This means no tennis, golf, or swimming for this period of time. If you tend to sleep with your arm above your head, use a restraint to prevent this during sleep. ? Avoid sudden jerking, pulling, or chopping movements that pull your upper arm far away from your body.  Ask your health care provider  when you may go back to work.  Check with your health care provider before you start to drive or play sports. Electric and magnetic fields  Tell all health care providers that you have a defibrillator. This may prevent them from giving you an MRI scan because strong magnets are used for that test.  If you must pass through a metal detector, quickly walk through it. Do not stop under the detector, and do not stand near it.  Avoid places or objects that have a strong electric or magnetic field, including: ? [employer]. At the airport, let officials know that you have a defibrillator. Your defibrillator ID card will let you be checked in a way that is safe for you and will not damage your defibrillator. Also, do not let a security person wave a magnetic wand near your defibrillator. That can make it stop working. ? Power plants. ? Large electrical generators. ? Anti-theft systems or electronic article surveillance (EAS). ? Radiofrequency transmission towers, such as cell phone and radio towers.  Do not use amateur (ham) radio equipment or electric (arc) welding torches. Some devices are safe to use if held at least 12 inches (30 cm) from your defibrillator. These include power tools, lawn mowers, and speakers. If you are unsure if something is safe to use, ask your health care provider.  Do not use MP3 player headphones. They have magnets.  You may safely use electric blankets, heating pads, computers, and microwave ovens.  When using your cell phone, hold it to the ear that is on the opposite side from the defibrillator. Do not leave your cell phone in a pocket over the defibrillator. General instructions  Follow diet instructions from  your health care provider, if this applies.  Always keep your defibrillator ID card with you. The card should list the implant date, device model, and manufacturer. Consider wearing a medical alert bracelet or necklace.  Have your defibrillator  checked every 3-6 months or as often as told by your health care provider. Most defibrillators last for 4-8 years.  Keep all follow-up visits as told by your health care provider. This is important for your health care provider to make sure your chest is healing the way it should. Ask your health care provider when you should come back to have your stitches or staples taken out. Contact a health care provider if:  You feel one shock in your chest.  You gain weight suddenly.  Your legs or feet swell more than they have before.  It feels like your heart is fluttering or skipping beats (heart palpitations).  You have more redness, swelling, or pain around your incision.  You have more fluid or blood coming from your incision.  Your incision feels warm to the touch.  You have pus or a bad smell coming from your incision.  You have a fever. Get help right away if:  You have chest pain.  You feel more than one shock.  You feel more short of breath than you have felt before.  You feel more light-headed than you have felt before.  Your incision starts to open up. This information is not intended to replace advice given to you by your health care provider. Make sure you discuss any questions you have with your health care provider. Document Released: 07/26/2004 Document Revised: 07/27/2015 Document Reviewed: 06/13/2015 Elsevier Interactive Patient Education  2018 [employer].     Supplemental Discharge Instructions for  Pacemaker/Defibrillator Patients  ACTIVITY No heavy lifting or vigorous activity with your left/right arm for 6 to 8 weeks.  Do not raise your left/right arm above your head for one week.  Gradually raise your affected arm as drawn below.           __  NO DRIVING for     ; you may begin driving on     .  WOUND CARE - Keep the wound area clean and dry.  Do not get this area wet for one week. No showers for one week; you may shower on     . - The  tape/steri-strips on your wound will fall off; do not pull them off.  No bandage is needed on the site.  DO  NOT apply any creams, oils, or ointments to the wound area. - If you notice any drainage or discharge from the wound, any swelling or bruising at the site, or you develop a fever > 101? F after you are discharged home, call the office at once.  SPECIAL INSTRUCTIONS - You are still able to use cellular telephones; use the ear opposite the side where you have your pacemaker/defibrillator.  Avoid carrying your cellular phone near your device. - When traveling through airports, show security personnel your identification card to avoid being screened in the metal detectors.  Ask the security personnel to use the hand wand. - Avoid arc welding equipment, MRI testing (magnetic resonance imaging), TENS units (transcutaneous nerve stimulators).  Call the office for questions about other devices. - Avoid electrical appliances that are in poor condition or are not properly grounded. - Microwave ovens are safe to be near or to operate.  ADDITIONAL INFORMATION FOR DEFIBRILLATOR PATIENTS SHOULD YOUR DEVICE GO OFF: -  If your device goes off ONCE and you feel fine afterward, notify the device clinic nurses. - If your device goes off ONCE and you do not feel well afterward, call 911. - If your device goes off TWICE, call 911. - If your device goes off [place], call 911.  DO NOT DRIVE YOURSELF OR A FAMILY MEMBER WITH A DEFIBRILLATOR TO THE HOSPITAL-CALL 911.
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
## 2017-12-23 NOTE — Addendum Note (Signed)
Addended by: Tonita Phoenix on: 12/23/2017 09:27 AM   Modules accepted: Orders

## 2017-12-24 DIAGNOSIS — I63511 Cerebral infarction due to unspecified occlusion or stenosis of right middle cerebral artery: Secondary | ICD-10-CM | POA: Diagnosis not present

## 2017-12-25 DIAGNOSIS — I63511 Cerebral infarction due to unspecified occlusion or stenosis of right middle cerebral artery: Secondary | ICD-10-CM | POA: Diagnosis not present

## 2017-12-28 DIAGNOSIS — I63511 Cerebral infarction due to unspecified occlusion or stenosis of right middle cerebral artery: Secondary | ICD-10-CM | POA: Diagnosis not present

## 2017-12-29 DIAGNOSIS — I63511 Cerebral infarction due to unspecified occlusion or stenosis of right middle cerebral artery: Secondary | ICD-10-CM | POA: Diagnosis not present

## 2017-12-31 DIAGNOSIS — I63511 Cerebral infarction due to unspecified occlusion or stenosis of right middle cerebral artery: Secondary | ICD-10-CM | POA: Diagnosis not present

## 2018-01-01 DIAGNOSIS — I63511 Cerebral infarction due to unspecified occlusion or stenosis of right middle cerebral artery: Secondary | ICD-10-CM | POA: Diagnosis not present

## 2018-01-04 DIAGNOSIS — I63511 Cerebral infarction due to unspecified occlusion or stenosis of right middle cerebral artery: Secondary | ICD-10-CM | POA: Diagnosis not present

## 2018-01-05 DIAGNOSIS — I63511 Cerebral infarction due to unspecified occlusion or stenosis of right middle cerebral artery: Secondary | ICD-10-CM | POA: Diagnosis not present

## 2018-01-06 DIAGNOSIS — I63511 Cerebral infarction due to unspecified occlusion or stenosis of right middle cerebral artery: Secondary | ICD-10-CM | POA: Diagnosis not present

## 2018-01-07 DIAGNOSIS — I63511 Cerebral infarction due to unspecified occlusion or stenosis of right middle cerebral artery: Secondary | ICD-10-CM | POA: Diagnosis not present

## 2018-01-08 DIAGNOSIS — I63511 Cerebral infarction due to unspecified occlusion or stenosis of right middle cerebral artery: Secondary | ICD-10-CM | POA: Diagnosis not present

## 2018-01-11 DIAGNOSIS — I63511 Cerebral infarction due to unspecified occlusion or stenosis of right middle cerebral artery: Secondary | ICD-10-CM | POA: Diagnosis not present

## 2018-01-12 DIAGNOSIS — I63511 Cerebral infarction due to unspecified occlusion or stenosis of right middle cerebral artery: Secondary | ICD-10-CM | POA: Diagnosis not present

## 2018-01-13 DIAGNOSIS — I63511 Cerebral infarction due to unspecified occlusion or stenosis of right middle cerebral artery: Secondary | ICD-10-CM | POA: Diagnosis not present

## 2018-01-14 DIAGNOSIS — I63511 Cerebral infarction due to unspecified occlusion or stenosis of right middle cerebral artery: Secondary | ICD-10-CM | POA: Diagnosis not present

## 2018-01-15 DIAGNOSIS — I63511 Cerebral infarction due to unspecified occlusion or stenosis of right middle cerebral artery: Secondary | ICD-10-CM | POA: Diagnosis not present

## 2018-01-18 ENCOUNTER — Ambulatory Visit (HOSPITAL_COMMUNITY)
Admission: RE | Admit: 2018-01-18 | Discharge: 2018-01-19 | Disposition: A | Discharge status: Home / Self Care | Payer: Medicaid Other | Attending: Cardiology | Admitting: Cardiology

## 2018-01-18 ENCOUNTER — Encounter (HOSPITAL_COMMUNITY)
Admission: RE | Disposition: A | Discharge status: Home / Self Care | Payer: Self-pay | Source: Home / Self Care | Attending: Cardiology

## 2018-01-18 ENCOUNTER — Encounter (HOSPITAL_COMMUNITY): Payer: Self-pay | Admitting: Cardiology

## 2018-01-18 ENCOUNTER — Other Ambulatory Visit: Payer: Self-pay

## 2018-01-18 DIAGNOSIS — F17200 Nicotine dependence, unspecified, uncomplicated: Secondary | ICD-10-CM | POA: Diagnosis not present

## 2018-01-18 DIAGNOSIS — Z7982 Long term (current) use of aspirin: Secondary | ICD-10-CM | POA: Diagnosis not present

## 2018-01-18 DIAGNOSIS — I110 Hypertensive heart disease with heart failure: Secondary | ICD-10-CM | POA: Insufficient documentation

## 2018-01-18 DIAGNOSIS — Z8673 Personal history of transient ischemic attack (TIA), and cerebral infarction without residual deficits: Secondary | ICD-10-CM | POA: Insufficient documentation

## 2018-01-18 DIAGNOSIS — I428 Other cardiomyopathies: Secondary | ICD-10-CM | POA: Insufficient documentation

## 2018-01-18 DIAGNOSIS — G4733 Obstructive sleep apnea (adult) (pediatric): Secondary | ICD-10-CM | POA: Diagnosis not present

## 2018-01-18 DIAGNOSIS — Z006 Encounter for examination for normal comparison and control in clinical research program: Secondary | ICD-10-CM | POA: Insufficient documentation

## 2018-01-18 DIAGNOSIS — Z880 Allergy status to penicillin: Secondary | ICD-10-CM | POA: Insufficient documentation

## 2018-01-18 DIAGNOSIS — Z8249 Family history of ischemic heart disease and other diseases of the circulatory system: Secondary | ICD-10-CM | POA: Diagnosis not present

## 2018-01-18 DIAGNOSIS — I255 Ischemic cardiomyopathy: Secondary | ICD-10-CM | POA: Insufficient documentation

## 2018-01-18 DIAGNOSIS — I5022 Chronic systolic (congestive) heart failure: Secondary | ICD-10-CM | POA: Insufficient documentation

## 2018-01-18 DIAGNOSIS — Z79899 Other long term (current) drug therapy: Secondary | ICD-10-CM | POA: Diagnosis not present

## 2018-01-18 DIAGNOSIS — Z823 Family history of stroke: Secondary | ICD-10-CM | POA: Insufficient documentation

## 2018-01-18 DIAGNOSIS — Z95818 Presence of other cardiac implants and grafts: Secondary | ICD-10-CM

## 2018-01-18 HISTORY — PX: ICD IMPLANT: EP1208

## 2018-01-18 LAB — SURGICAL PCR SCREEN
MRSA, PCR: NEGATIVE
Staphylococcus aureus: NEGATIVE

## 2018-01-18 SURGERY — ICD IMPLANT

## 2018-01-18 MED ORDER — FUROSEMIDE 40 MG PO TABS
40.0000 mg | ORAL_TABLET | Freq: Two times a day (BID) | ORAL | Status: DC
  Administered 2018-01-18 – 2018-01-19 (×2): 40 mg via ORAL
  Filled 2018-01-18 (×2): qty 1

## 2018-01-18 MED ORDER — NICOTINE 21 MG/24HR TD PT24
21.0000 mg | MEDICATED_PATCH | Freq: Every day | TRANSDERMAL | Status: DC
  Administered 2018-01-18 – 2018-01-19 (×2): 21 mg via TRANSDERMAL
  Filled 2018-01-18 (×2): qty 1

## 2018-01-18 MED ORDER — SACUBITRIL-VALSARTAN 97-103 MG PO TABS
1.0000 | ORAL_TABLET | Freq: Two times a day (BID) | ORAL | Status: DC
  Administered 2018-01-18 – 2018-01-19 (×3): 1 via ORAL
  Filled 2018-01-18 (×3): qty 1

## 2018-01-18 MED ORDER — LIDOCAINE HCL (PF) 1 % IJ SOLN
INTRAMUSCULAR | Status: AC
  Filled 2018-01-18: qty 60

## 2018-01-18 MED ORDER — HYDRALAZINE HCL 50 MG PO TABS
50.0000 mg | ORAL_TABLET | Freq: Three times a day (TID) | ORAL | Status: DC
  Administered 2018-01-18 – 2018-01-19 (×3): 50 mg via ORAL
  Filled 2018-01-18 (×3): qty 1

## 2018-01-18 MED ORDER — KETOROLAC TROMETHAMINE 15 MG/ML IJ SOLN
15.0000 mg | Freq: Once | INTRAMUSCULAR | Status: AC
  Administered 2018-01-18: 15 mg via INTRAVENOUS
  Filled 2018-01-18: qty 1

## 2018-01-18 MED ORDER — SODIUM CHLORIDE 0.9 % IV SOLN
INTRAVENOUS | Status: DC
  Administered 2018-01-18: 06:00:00 via INTRAVENOUS

## 2018-01-18 MED ORDER — ASPIRIN EC 81 MG PO TBEC
81.0000 mg | DELAYED_RELEASE_TABLET | Freq: Every day | ORAL | Status: DC
  Administered 2018-01-18 – 2018-01-19 (×2): 81 mg via ORAL
  Filled 2018-01-18 (×2): qty 1

## 2018-01-18 MED ORDER — FOLIC ACID 1 MG PO TABS
1.0000 mg | ORAL_TABLET | Freq: Every day | ORAL | Status: DC
  Administered 2018-01-19: 1 mg via ORAL
  Filled 2018-01-18: qty 1

## 2018-01-18 MED ORDER — MIDAZOLAM HCL 5 MG/5ML IJ SOLN
INTRAMUSCULAR | Status: AC
  Filled 2018-01-18: qty 5

## 2018-01-18 MED ORDER — ACETAMINOPHEN 325 MG PO TABS
325.0000 mg | ORAL_TABLET | ORAL | Status: DC | PRN
  Administered 2018-01-18: 650 mg via ORAL
  Filled 2018-01-18: qty 2

## 2018-01-18 MED ORDER — ADULT MULTIVITAMIN W/MINERALS CH
1.0000 | ORAL_TABLET | Freq: Every day | ORAL | Status: DC
  Administered 2018-01-19: 1 via ORAL
  Filled 2018-01-18: qty 1

## 2018-01-18 MED ORDER — SPIRONOLACTONE 25 MG PO TABS
25.0000 mg | ORAL_TABLET | Freq: Two times a day (BID) | ORAL | Status: DC
  Administered 2018-01-18 – 2018-01-19 (×2): 25 mg via ORAL
  Filled 2018-01-18 (×2): qty 1

## 2018-01-18 MED ORDER — VANCOMYCIN HCL IN DEXTROSE 1-5 GM/200ML-% IV SOLN
INTRAVENOUS | Status: AC
  Filled 2018-01-18: qty 200

## 2018-01-18 MED ORDER — MIDAZOLAM HCL 5 MG/5ML IJ SOLN
INTRAMUSCULAR | Status: DC | PRN
  Administered 2018-01-18 (×3): 1 mg via INTRAVENOUS

## 2018-01-18 MED ORDER — HEPARIN (PORCINE) IN NACL 1000-0.9 UT/500ML-% IV SOLN
INTRAVENOUS | Status: AC
  Filled 2018-01-18: qty 500

## 2018-01-18 MED ORDER — NEBIVOLOL HCL 5 MG PO TABS
5.0000 mg | ORAL_TABLET | Freq: Every day | ORAL | Status: DC
  Administered 2018-01-18 – 2018-01-19 (×2): 5 mg via ORAL
  Filled 2018-01-18 (×2): qty 1

## 2018-01-18 MED ORDER — MAGNESIUM OXIDE 400 (241.3 MG) MG PO TABS
400.0000 mg | ORAL_TABLET | Freq: Every day | ORAL | Status: DC
  Administered 2018-01-18 – 2018-01-19 (×2): 400 mg via ORAL
  Filled 2018-01-18 (×2): qty 1

## 2018-01-18 MED ORDER — HEPARIN (PORCINE) IN NACL 1000-0.9 UT/500ML-% IV SOLN
INTRAVENOUS | Status: DC | PRN
  Administered 2018-01-18: 500 mL

## 2018-01-18 MED ORDER — SODIUM CHLORIDE 0.9 % IV SOLN
INTRAVENOUS | Status: AC
  Filled 2018-01-18: qty 2

## 2018-01-18 MED ORDER — VANCOMYCIN HCL IN DEXTROSE 1-5 GM/200ML-% IV SOLN
1000.0000 mg | Freq: Two times a day (BID) | INTRAVENOUS | Status: AC
  Administered 2018-01-18: 1000 mg via INTRAVENOUS
  Filled 2018-01-18: qty 200

## 2018-01-18 MED ORDER — TIOTROPIUM BROMIDE MONOHYDRATE 18 MCG IN CAPS: 18.0000 ug | ORAL_CAPSULE | Freq: Every day | RESPIRATORY_TRACT | Status: DC

## 2018-01-18 MED ORDER — ALBUTEROL SULFATE (2.5 MG/3ML) 0.083% IN NEBU: 2.5000 mg | INHALATION_SOLUTION | Freq: Four times a day (QID) | RESPIRATORY_TRACT | Status: DC | PRN

## 2018-01-18 MED ORDER — VANCOMYCIN HCL IN DEXTROSE 1-5 GM/200ML-% IV SOLN
1000.0000 mg | INTRAVENOUS | Status: AC
  Administered 2018-01-18: 1000 mg via INTRAVENOUS
  Filled 2018-01-18: qty 200

## 2018-01-18 MED ORDER — MUPIROCIN 2 % EX OINT
TOPICAL_OINTMENT | CUTANEOUS | Status: AC
  Administered 2018-01-18: 06:00:00
  Filled 2018-01-18: qty 22

## 2018-01-18 MED ORDER — ONDANSETRON HCL 4 MG/2ML IJ SOLN: 4.0000 mg | Freq: Four times a day (QID) | INTRAMUSCULAR | Status: DC | PRN

## 2018-01-18 MED ORDER — UMECLIDINIUM BROMIDE 62.5 MCG/INH IN AEPB
1.0000 | INHALATION_SPRAY | Freq: Every day | RESPIRATORY_TRACT | Status: DC
  Administered 2018-01-18 – 2018-01-19 (×2): 1 via RESPIRATORY_TRACT
  Filled 2018-01-18: qty 7

## 2018-01-18 MED ORDER — LIDOCAINE HCL (PF) 1 % IJ SOLN
INTRAMUSCULAR | Status: DC | PRN
  Administered 2018-01-18: 50 mL

## 2018-01-18 MED ORDER — LIDOCAINE HCL 1 % IJ SOLN
INTRAMUSCULAR | Status: AC
  Filled 2018-01-18: qty 20

## 2018-01-18 MED ORDER — FENTANYL CITRATE (PF) 100 MCG/2ML IJ SOLN
INTRAMUSCULAR | Status: DC | PRN
  Administered 2018-01-18: 12.5 ug via INTRAVENOUS
  Administered 2018-01-18 (×2): 25 ug via INTRAVENOUS

## 2018-01-18 MED ORDER — FENTANYL CITRATE (PF) 100 MCG/2ML IJ SOLN
INTRAMUSCULAR | Status: AC
  Filled 2018-01-18: qty 2

## 2018-01-18 MED ORDER — SODIUM CHLORIDE 0.9 % IV SOLN
80.0000 mg | INTRAVENOUS | Status: AC
  Administered 2018-01-18: 80 mg
  Filled 2018-01-18: qty 2

## 2018-01-18 MED ORDER — LORATADINE 10 MG PO TABS
10.0000 mg | ORAL_TABLET | Freq: Every day | ORAL | Status: DC
  Administered 2018-01-19: 10 mg via ORAL
  Filled 2018-01-18: qty 1

## 2018-01-18 SURGICAL SUPPLY — 6 items
CABLE SURGICAL S-101-97-12 (CABLE) ×3 IMPLANT
ICD ELLIPSE VR CD1411-36Q (ICD Generator) ×3 IMPLANT
LEAD DURATA 7122Q-65CM (Lead) ×3 IMPLANT
PAD PRO RADIOLUCENT 2001M-C (PAD) ×3 IMPLANT
SHEATH CLASSIC 7F (SHEATH) ×3 IMPLANT
TRAY PACEMAKER INSERTION (PACKS) ×3 IMPLANT

## 2018-01-18 NOTE — Discharge Instructions (Signed)
° ° °  Supplemental Discharge Instructions for  °Pacemaker/Defibrillator Patients ° °Activity °No heavy lifting or vigorous activity with your left/right arm for 6 to 8 weeks.  Do not raise your left/right arm above your head for one week.  Gradually raise your affected arm as drawn below. ° °        °    01/22/18                      01/23/18                      01/24/18                     01/25/18 °__ ° °NO DRIVING for  1 week   ; you may begin driving on  01/25/18  . ° °WOUND CARE °- Keep the wound area clean and dry.  Do not get this area wet, no showers until cleared to at your wound check visit . °- The tape/steri-strips on your wound will fall off; do not pull them off.  No bandage is needed on the site.  DO  NOT apply any creams, oils, or ointments to the wound area. °- If you notice any drainage or discharge from the wound, any swelling or bruising at the site, or you develop a fever > 101? F after you are discharged home, call the office at once. ° °Special Instructions °- You are still able to use cellular telephones; use the ear opposite the side where you have your pacemaker/defibrillator.  Avoid carrying your cellular phone near your device. °- When traveling through airports, show security personnel your identification card to avoid being screened in the metal detectors.  Ask the security personnel to use the hand wand. °- Avoid arc welding equipment, MRI testing (magnetic resonance imaging), TENS units (transcutaneous nerve stimulators).  Call the office for questions about other devices. °- Avoid electrical appliances that are in poor condition or are not properly grounded. °- Microwave ovens are safe to be near or to operate. ° °Additional information for defibrillator patients should your device go off: °- If your device goes off ONCE and you feel fine afterward, notify the device clinic nurses. °- If your device goes off ONCE and you do not feel well afterward, call 911. °- If your device goes off TWICE,  call 911. °- If your device goes off THREE times in one day, call 911. ° °DO NOT DRIVE YOURSELF OR A FAMILY MEMBER °WITH A DEFIBRILLATOR TO THE HOSPITAL--CALL 911.\ ° °

## 2018-01-18 NOTE — Discharge Summary (Addendum)
ELECTROPHYSIOLOGY PROCEDURE DISCHARGE SUMMARY    Patient ID: [name],  MRN: [id number], DOB/AGE: September 08, 1965 52 y.o.  Admit date: 01/18/2018 Discharge date: 01/19/18  Primary Care Physician: Hoy Register, MD  Primary Cardiologist: [name] Electrophysiologist: Dr. Elberta Fortis  Primary Discharge Diagnosis:  1. NICM  Secondary Discharge Diagnosis:  1. Chronic CHF (systolic) 2. HTN 3. OSA 4. Tobacco abuse 5. Stroke (old)  Allergies  Allergen Reactions  . Penicillins Other (See Comments)    Blisters  Has patient had a PCN reaction causing immediate rash, facial/tongue/throat swelling, SOB or lightheadedness with hypotension: Yes Has patient had a PCN reaction causing severe rash involving mucus membranes or skin necrosis: Yes Has patient had a PCN reaction that required hospitalization: No Has patient had a PCN reaction occurring within the last 10 years: No If all of the above answers are "NO", then may proceed with Cephalosporin use.      Procedures This Admission:  1.  Implantation of a SJM single chamber ICD on 01/18/18 by Dr Elberta Fortis.  The patient received [facility], model 7741O-87 (serial number [id number]) right ventricular defibrillator lead, [employer] [place] [id number] (serial  Number [id number]) ICD DFT's were deferred at time of implant There were no immediate post procedure complications. 2.  CXR on 01/19/18 demonstrated no pneumothorax status post device implantation.   Brief HPI: [name] is a 52 y.o. male was referred to electrophysiology in the outpatient setting for consideration of ICD implantation.  Past medical history includes above.  The patient has persistent LV dysfunction despite guideline directed therapy.  Risks, benefits, and alternatives to ICD implantation were reviewed with the patient who wished to proceed.   Hospital Course:  The patient was admitted and underwent implantation of an ICD with  details as outlined above. He was monitored on telemetry overnight which demonstrated SR.  Left chest was without hematoma or ecchymosis.  The device was interrogated and found to be functioning normally.  CXR was obtained and demonstrated no pneumothorax status post device implantation.  Wound care, arm mobility, and restrictions were reviewed with the patient.  The patient feels well this morning, denies any CP or SOB, he was examined by Dr. Elberta Fortis and considered stable for discharge to home.   The patient's discharge medications include an ARB (entresto) and beta blocker (nebivolol).   Physical Exam: Vitals:   01/18/18 2000 01/19/18 0015 01/19/18 0353 01/19/18 0742  BP: (!) 132/101 128/80 127/89 (!) 144/86  Pulse: 67 65 64 65  Resp: 16 20 18 16   Temp: 98.4 F (36.9 C) 98.1 F (36.7 C) 97.6 F (36.4 C) 98.6 F (37 C)  TempSrc: Oral Oral Oral Oral  SpO2: 100% 98% 98% 100%  Weight:   64.5 kg   Height:        GEN- The patient is well appearing, alert and oriented x 3 today.   HEENT: normocephalic, atraumatic; sclera clear, conjunctiva pink; hearing intact; oropharynx clear Lungs- CTA b/l, normal work of breathing.  No wheezes, rales, rhonchi Heart- RRR, no murmurs, rubs or gallops GI- soft, non-tender, non-distended Extremities- no clubbing, cyanosis, or edema MS- no significant deformity or atrophy Skin- warm and dry, no rash or lesion, left chest without hematoma/ecchymosis Psych- euthymic mood, full affect Neuro- no gross defecits  Labs:   Lab Results  Component Value Date   WBC 4.0 12/23/2017   HGB 13.6 12/23/2017   HCT 39.2 12/23/2017   MCV 102 (H) 12/23/2017   PLT  184 12/23/2017   No results for input(s): NA, K, CL, CO2, BUN, CREATININE, CALCIUM, PROT, BILITOT, ALKPHOS, ALT, AST, GLUCOSE in the last 168 hours.  Invalid input(s): LABALBU  Discharge Medications:  Allergies as of 01/19/2018      Reactions   Penicillins Other (See Comments)   Blisters  Has patient  had a PCN reaction causing immediate rash, facial/tongue/throat swelling, SOB or lightheadedness with hypotension: Yes Has patient had a PCN reaction causing severe rash involving mucus membranes or skin necrosis: Yes Has patient had a PCN reaction that required hospitalization: No Has patient had a PCN reaction occurring within the last 10 years: No If all of the above answers are "NO", then may proceed with Cephalosporin use.      Medication List    TAKE these medications   aspirin EC 81 MG tablet Take 81 mg by mouth daily.   cetirizine 10 MG tablet Commonly known as:  ZYRTEC Take 1 tablet (10 mg total) by mouth daily.   folic acid 1 MG tablet Commonly known as:  FOLVITE Take 1 mg by mouth daily.   furosemide 40 MG tablet Commonly known as:  LASIX Take 1 tablet (40 mg total) by mouth 2 (two) times daily.   hydrALAZINE 50 MG tablet Commonly known as:  APRESOLINE Take 1 tablet (50 mg total) by mouth 3 (three) times daily.   magnesium oxide 400 MG tablet Commonly known as:  MAG-OX Take 1 tablet (400 mg total) by mouth daily.   multivitamin with minerals Tabs tablet Take 1 tablet by mouth daily.   nebivolol 5 MG tablet Commonly known as:  BYSTOLIC Take 1 tablet (5 mg total) by mouth daily.   nicotine 21 mg/24hr patch Commonly known as:  NICODERM CQ - dosed in mg/24 hours Place 21 mg onto the skin daily.   PROAIR HFA 108 (90 Base) MCG/ACT inhaler Generic drug:  albuterol INHALE 2 PUFFS INTO THE LUNGS EVERY 6 (SIX) HOURS AS NEEDED FOR WHEEZING OR SHORTNESS OF BREATH. What changed:  See the new instructions.   sacubitril-valsartan 97-103 MG Commonly known as:  ENTRESTO Take 1 tablet by mouth 2 (two) times daily.   spironolactone 25 MG tablet Commonly known as:  ALDACTONE Take 1 tablet (25 mg total) by mouth 2 (two) times daily.   tiotropium 18 MCG inhalation capsule Commonly known as:  SPIRIVA HANDIHALER Place 1 capsule (18 mcg total) into inhaler and inhale  daily.       Disposition:  Home  Discharge Instructions    Diet - low sodium heart healthy   Complete by:  As directed    Increase activity slowly   Complete by:  As directed      Follow-up Information    [facility] Office Follow up.   Specialty:  Cardiology Why:  02/01/18 @ 11;30AM, wound check visit Contact information: [address], [address] [place] [address] [phone]       Regan Lemming, MD Follow up.   Specialty:  Cardiology Why:  you Erle Guster be called by Dr. Gershon Crane scheduler to make a routine 3 month office visit Contact information: [address] [place] [address] [phone]           Duration of Discharge Encounter: Greater than 30 minutes including physician time.  Norma Fredrickson, PA-C 01/19/2018 7:47 AM    I have seen and examined this patient with Francis Dowse.  Agree with above, note added to reflect my  findings.  On exam, RRR, no murmurs, lungs clear.  Single-chamber ICD implanted for primary prevention due to nonischemic cardiomyopathy.  Chest x-ray and interrogation without issues.  Plan for discharge today with follow-up in clinic.  Lonie Rummell M. Elasha Tess MD 01/19/2018 8:18 AM

## 2018-01-18 NOTE — H&P (Signed)
ICD Criteria  Current LVEF:20-25%. Within 12 months prior to implant: Yes   Heart failure history: Yes, Class II  Cardiomyopathy history: Yes, Non-Ischemic Cardiomyopathy.  Atrial Fibrillation/Atrial Flutter: No.  Ventricular tachycardia history: No.  Cardiac arrest history: No.  History of syndromes with risk of sudden death: No.  Previous ICD: No.  Current ICD indication: Primary  PPM indication: No.  Class I or II Bradycardia indication present: No  Beta Blocker therapy for 3 or more months: Yes, prescribed.   Ace Inhibitor/ARB therapy for 3 or more months: Yes, prescribed.    I have seen [name] is a 52 y.o. malepre-procedural and has been referred by tiffany Choudrant for consideration of ICD implant for primary prevention of sudden death.  The patient's chart has been reviewed and they meet criteria for ICD implant.  I have had a thorough discussion with the patient reviewing options.  The patient and their family (if available) have had opportunities to ask questions and have them answered. The patient and I have decided together through the [facility] Share Decision Support Tool to implant ICD at this time.  Risks, benefits, alternatives to ICD implantation were discussed in detail with the patient today. The patient  understands that the risks include but are not limited to bleeding, infection, pneumothorax, perforation, tamponade, vascular damage, renal failure, MI, stroke, death, inappropriate shocks, and lead dislodgement and  wishes to proceed.

## 2018-01-19 ENCOUNTER — Ambulatory Visit (HOSPITAL_COMMUNITY): Payer: Medicaid Other

## 2018-01-19 DIAGNOSIS — I110 Hypertensive heart disease with heart failure: Secondary | ICD-10-CM | POA: Diagnosis not present

## 2018-01-19 DIAGNOSIS — I428 Other cardiomyopathies: Secondary | ICD-10-CM | POA: Diagnosis not present

## 2018-01-19 DIAGNOSIS — I255 Ischemic cardiomyopathy: Secondary | ICD-10-CM | POA: Diagnosis not present

## 2018-01-19 DIAGNOSIS — Z95810 Presence of automatic (implantable) cardiac defibrillator: Secondary | ICD-10-CM | POA: Diagnosis not present

## 2018-01-19 DIAGNOSIS — Z006 Encounter for examination for normal comparison and control in clinical research program: Secondary | ICD-10-CM | POA: Diagnosis not present
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
## 2018-01-19 NOTE — Plan of Care (Signed)
  Problem: Education: Goal: Knowledge of General Education information will improve Description: Including pain rating scale, medication(s)/side effects and non-pharmacologic comfort measures Outcome: Adequate for Discharge   Problem: Health Behavior/Discharge Planning: Goal: Ability to manage health-related needs will improve Outcome: Adequate for Discharge   Problem: Clinical Measurements: Goal: Ability to maintain clinical measurements within normal limits will improve Outcome: Adequate for Discharge Goal: Will remain free from infection Outcome: Adequate for Discharge Goal: Diagnostic test results will improve Outcome: Adequate for Discharge Goal: Respiratory complications will improve Outcome: Adequate for Discharge Goal: Cardiovascular complication will be avoided Outcome: Adequate for Discharge   Problem: Activity: Goal: Risk for activity intolerance will decrease Outcome: Adequate for Discharge   Problem: Nutrition: Goal: Adequate nutrition will be maintained Outcome: Adequate for Discharge   Problem: Coping: Goal: Level of anxiety will decrease Outcome: Adequate for Discharge   Problem: Elimination: Goal: Will not experience complications related to bowel motility Outcome: Adequate for Discharge Goal: Will not experience complications related to urinary retention Outcome: Adequate for Discharge   Problem: Pain Managment: Goal: General experience of comfort will improve Outcome: Adequate for Discharge   Problem: Safety: Goal: Ability to remain free from injury will improve Outcome: Adequate for Discharge   Problem: Skin Integrity: Goal: Risk for impaired skin integrity will decrease Outcome: Adequate for Discharge   Problem: Education: Goal: Ability to demonstrate management of disease process will improve Outcome: Adequate for Discharge Goal: Ability to verbalize understanding of medication therapies will improve Outcome: Adequate for Discharge    Problem: Activity: Goal: Capacity to carry out activities will improve Outcome: Adequate for Discharge   Problem: Cardiac: Goal: Ability to achieve and maintain adequate cardiopulmonary perfusion will improve Outcome: Adequate for Discharge   

## 2018-01-20 DIAGNOSIS — I63511 Cerebral infarction due to unspecified occlusion or stenosis of right middle cerebral artery: Secondary | ICD-10-CM | POA: Diagnosis not present

## 2018-01-21 DIAGNOSIS — I63511 Cerebral infarction due to unspecified occlusion or stenosis of right middle cerebral artery: Secondary | ICD-10-CM | POA: Diagnosis not present

## 2018-01-22 DIAGNOSIS — I63511 Cerebral infarction due to unspecified occlusion or stenosis of right middle cerebral artery: Secondary | ICD-10-CM | POA: Diagnosis not present

## 2018-01-25 DIAGNOSIS — I63511 Cerebral infarction due to unspecified occlusion or stenosis of right middle cerebral artery: Secondary | ICD-10-CM | POA: Diagnosis not present

## 2018-01-26 DIAGNOSIS — I63511 Cerebral infarction due to unspecified occlusion or stenosis of right middle cerebral artery: Secondary | ICD-10-CM | POA: Diagnosis not present

## 2018-01-27 DIAGNOSIS — I63511 Cerebral infarction due to unspecified occlusion or stenosis of right middle cerebral artery: Secondary | ICD-10-CM | POA: Diagnosis not present

## 2018-01-28 DIAGNOSIS — I63511 Cerebral infarction due to unspecified occlusion or stenosis of right middle cerebral artery: Secondary | ICD-10-CM | POA: Diagnosis not present

## 2018-01-29 DIAGNOSIS — I63511 Cerebral infarction due to unspecified occlusion or stenosis of right middle cerebral artery: Secondary | ICD-10-CM | POA: Diagnosis not present

## 2018-02-01 ENCOUNTER — Ambulatory Visit (INDEPENDENT_AMBULATORY_CARE_PROVIDER_SITE_OTHER): Payer: Medicaid Other | Admitting: Nurse Practitioner

## 2018-02-01 DIAGNOSIS — I5022 Chronic systolic (congestive) heart failure: Secondary | ICD-10-CM

## 2018-02-01 DIAGNOSIS — I63511 Cerebral infarction due to unspecified occlusion or stenosis of right middle cerebral artery: Secondary | ICD-10-CM | POA: Diagnosis not present

## 2018-02-01 LAB — CUP PACEART INCLINIC DEVICE CHECK
Date Time Interrogation Session: 20200113115013
Implantable Lead Implant Date: 20191230
Implantable Lead Implant Date: 20191230
Implantable Lead Location: 753860
Implantable Lead Location: 753860
Implantable Lead Model: 7122
MDC IDC PG IMPLANT DT: 20191230
Pulse Gen Serial Number: 9850980

## 2018-02-01 NOTE — Progress Notes (Signed)

## 2018-02-02 DIAGNOSIS — I63511 Cerebral infarction due to unspecified occlusion or stenosis of right middle cerebral artery: Secondary | ICD-10-CM | POA: Diagnosis not present

## 2018-02-03 DIAGNOSIS — I63511 Cerebral infarction due to unspecified occlusion or stenosis of right middle cerebral artery: Secondary | ICD-10-CM | POA: Diagnosis not present

## 2018-02-04 DIAGNOSIS — I63511 Cerebral infarction due to unspecified occlusion or stenosis of right middle cerebral artery: Secondary | ICD-10-CM | POA: Diagnosis not present

## 2018-02-05 DIAGNOSIS — I63511 Cerebral infarction due to unspecified occlusion or stenosis of right middle cerebral artery: Secondary | ICD-10-CM | POA: Diagnosis not present

## 2018-02-08 DIAGNOSIS — I63511 Cerebral infarction due to unspecified occlusion or stenosis of right middle cerebral artery: Secondary | ICD-10-CM | POA: Diagnosis not present

## 2018-02-09 DIAGNOSIS — I63511 Cerebral infarction due to unspecified occlusion or stenosis of right middle cerebral artery: Secondary | ICD-10-CM | POA: Diagnosis not present

## 2018-02-10 DIAGNOSIS — I63511 Cerebral infarction due to unspecified occlusion or stenosis of right middle cerebral artery: Secondary | ICD-10-CM | POA: Diagnosis not present

## 2018-02-11 DIAGNOSIS — I63511 Cerebral infarction due to unspecified occlusion or stenosis of right middle cerebral artery: Secondary | ICD-10-CM | POA: Diagnosis not present

## 2018-02-12 DIAGNOSIS — I63511 Cerebral infarction due to unspecified occlusion or stenosis of right middle cerebral artery: Secondary | ICD-10-CM | POA: Diagnosis not present

## 2018-02-15 DIAGNOSIS — I63511 Cerebral infarction due to unspecified occlusion or stenosis of right middle cerebral artery: Secondary | ICD-10-CM | POA: Diagnosis not present

## 2018-02-16 DIAGNOSIS — I63511 Cerebral infarction due to unspecified occlusion or stenosis of right middle cerebral artery: Secondary | ICD-10-CM | POA: Diagnosis not present

## 2018-02-17 DIAGNOSIS — I63511 Cerebral infarction due to unspecified occlusion or stenosis of right middle cerebral artery: Secondary | ICD-10-CM | POA: Diagnosis not present

## 2018-02-18 DIAGNOSIS — I63511 Cerebral infarction due to unspecified occlusion or stenosis of right middle cerebral artery: Secondary | ICD-10-CM | POA: Diagnosis not present

## 2018-02-19 DIAGNOSIS — I63511 Cerebral infarction due to unspecified occlusion or stenosis of right middle cerebral artery: Secondary | ICD-10-CM | POA: Diagnosis not present

## 2018-02-22 DIAGNOSIS — I63511 Cerebral infarction due to unspecified occlusion or stenosis of right middle cerebral artery: Secondary | ICD-10-CM | POA: Diagnosis not present

## 2018-02-23 DIAGNOSIS — I63511 Cerebral infarction due to unspecified occlusion or stenosis of right middle cerebral artery: Secondary | ICD-10-CM | POA: Diagnosis not present

## 2018-02-24 DIAGNOSIS — I63511 Cerebral infarction due to unspecified occlusion or stenosis of right middle cerebral artery: Secondary | ICD-10-CM | POA: Diagnosis not present

## 2018-02-25 DIAGNOSIS — I63511 Cerebral infarction due to unspecified occlusion or stenosis of right middle cerebral artery: Secondary | ICD-10-CM | POA: Diagnosis not present

## 2018-02-26 DIAGNOSIS — I63511 Cerebral infarction due to unspecified occlusion or stenosis of right middle cerebral artery: Secondary | ICD-10-CM | POA: Diagnosis not present

## 2018-03-01 DIAGNOSIS — I63511 Cerebral infarction due to unspecified occlusion or stenosis of right middle cerebral artery: Secondary | ICD-10-CM | POA: Diagnosis not present

## 2018-03-02 DIAGNOSIS — I63511 Cerebral infarction due to unspecified occlusion or stenosis of right middle cerebral artery: Secondary | ICD-10-CM | POA: Diagnosis not present

## 2018-03-03 DIAGNOSIS — I63511 Cerebral infarction due to unspecified occlusion or stenosis of right middle cerebral artery: Secondary | ICD-10-CM | POA: Diagnosis not present

## 2018-03-04 DIAGNOSIS — I63511 Cerebral infarction due to unspecified occlusion or stenosis of right middle cerebral artery: Secondary | ICD-10-CM | POA: Diagnosis not present

## 2018-03-05 DIAGNOSIS — I63511 Cerebral infarction due to unspecified occlusion or stenosis of right middle cerebral artery: Secondary | ICD-10-CM | POA: Diagnosis not present

## 2018-03-08 DIAGNOSIS — I63511 Cerebral infarction due to unspecified occlusion or stenosis of right middle cerebral artery: Secondary | ICD-10-CM | POA: Diagnosis not present

## 2018-03-09 DIAGNOSIS — I63511 Cerebral infarction due to unspecified occlusion or stenosis of right middle cerebral artery: Secondary | ICD-10-CM | POA: Diagnosis not present

## 2018-03-10 DIAGNOSIS — I63511 Cerebral infarction due to unspecified occlusion or stenosis of right middle cerebral artery: Secondary | ICD-10-CM | POA: Diagnosis not present

## 2018-03-11 ENCOUNTER — Ambulatory Visit: Payer: Medicaid Other | Admitting: Family Medicine

## 2018-03-11 DIAGNOSIS — I63511 Cerebral infarction due to unspecified occlusion or stenosis of right middle cerebral artery: Secondary | ICD-10-CM | POA: Diagnosis not present

## 2018-03-12 DIAGNOSIS — I63511 Cerebral infarction due to unspecified occlusion or stenosis of right middle cerebral artery: Secondary | ICD-10-CM | POA: Diagnosis not present

## 2018-03-15 DIAGNOSIS — I63511 Cerebral infarction due to unspecified occlusion or stenosis of right middle cerebral artery: Secondary | ICD-10-CM | POA: Diagnosis not present

## 2018-03-16 DIAGNOSIS — I63511 Cerebral infarction due to unspecified occlusion or stenosis of right middle cerebral artery: Secondary | ICD-10-CM | POA: Diagnosis not present

## 2018-03-17 ENCOUNTER — Ambulatory Visit: Payer: Medicaid Other | Admitting: Cardiovascular Disease

## 2018-03-17 DIAGNOSIS — I63511 Cerebral infarction due to unspecified occlusion or stenosis of right middle cerebral artery: Secondary | ICD-10-CM | POA: Diagnosis not present

## 2018-03-18 DIAGNOSIS — I63511 Cerebral infarction due to unspecified occlusion or stenosis of right middle cerebral artery: Secondary | ICD-10-CM | POA: Diagnosis not present

## 2018-03-19 DIAGNOSIS — I63511 Cerebral infarction due to unspecified occlusion or stenosis of right middle cerebral artery: Secondary | ICD-10-CM | POA: Diagnosis not present

## 2018-03-22 DIAGNOSIS — I63511 Cerebral infarction due to unspecified occlusion or stenosis of right middle cerebral artery: Secondary | ICD-10-CM | POA: Diagnosis not present

## 2018-03-23 DIAGNOSIS — I63511 Cerebral infarction due to unspecified occlusion or stenosis of right middle cerebral artery: Secondary | ICD-10-CM | POA: Diagnosis not present

## 2018-03-24 DIAGNOSIS — I63511 Cerebral infarction due to unspecified occlusion or stenosis of right middle cerebral artery: Secondary | ICD-10-CM | POA: Diagnosis not present

## 2018-03-25 DIAGNOSIS — I63511 Cerebral infarction due to unspecified occlusion or stenosis of right middle cerebral artery: Secondary | ICD-10-CM | POA: Diagnosis not present

## 2018-03-26 DIAGNOSIS — I63511 Cerebral infarction due to unspecified occlusion or stenosis of right middle cerebral artery: Secondary | ICD-10-CM | POA: Diagnosis not present

## 2018-03-29 DIAGNOSIS — I63511 Cerebral infarction due to unspecified occlusion or stenosis of right middle cerebral artery: Secondary | ICD-10-CM | POA: Diagnosis not present

## 2018-03-30 DIAGNOSIS — I63511 Cerebral infarction due to unspecified occlusion or stenosis of right middle cerebral artery: Secondary | ICD-10-CM | POA: Diagnosis not present

## 2018-03-31 DIAGNOSIS — I63511 Cerebral infarction due to unspecified occlusion or stenosis of right middle cerebral artery: Secondary | ICD-10-CM | POA: Diagnosis not present

## 2018-04-01 DIAGNOSIS — I63511 Cerebral infarction due to unspecified occlusion or stenosis of right middle cerebral artery: Secondary | ICD-10-CM | POA: Diagnosis not present

## 2018-04-02 DIAGNOSIS — I63511 Cerebral infarction due to unspecified occlusion or stenosis of right middle cerebral artery: Secondary | ICD-10-CM | POA: Diagnosis not present

## 2018-04-05 ENCOUNTER — Ambulatory Visit: Payer: Medicaid Other | Admitting: Family Medicine

## 2018-04-05 DIAGNOSIS — I63511 Cerebral infarction due to unspecified occlusion or stenosis of right middle cerebral artery: Secondary | ICD-10-CM | POA: Diagnosis not present

## 2018-04-06 DIAGNOSIS — I63511 Cerebral infarction due to unspecified occlusion or stenosis of right middle cerebral artery: Secondary | ICD-10-CM | POA: Diagnosis not present

## 2018-04-07 DIAGNOSIS — I63511 Cerebral infarction due to unspecified occlusion or stenosis of right middle cerebral artery: Secondary | ICD-10-CM | POA: Diagnosis not present

## 2018-04-08 DIAGNOSIS — I63511 Cerebral infarction due to unspecified occlusion or stenosis of right middle cerebral artery: Secondary | ICD-10-CM | POA: Diagnosis not present

## 2018-04-09 DIAGNOSIS — I63511 Cerebral infarction due to unspecified occlusion or stenosis of right middle cerebral artery: Secondary | ICD-10-CM | POA: Diagnosis not present

## 2018-04-10 DIAGNOSIS — R079 Chest pain, unspecified: Secondary | ICD-10-CM | POA: Diagnosis not present

## 2018-04-10 DIAGNOSIS — R0602 Shortness of breath: Secondary | ICD-10-CM | POA: Diagnosis not present

## 2018-04-10 DIAGNOSIS — I10 Essential (primary) hypertension: Secondary | ICD-10-CM | POA: Diagnosis not present

## 2018-04-10 DIAGNOSIS — I213 ST elevation (STEMI) myocardial infarction of unspecified site: Secondary | ICD-10-CM | POA: Diagnosis not present

## 2018-04-11 ENCOUNTER — Emergency Department (HOSPITAL_COMMUNITY): Payer: Medicaid Other

## 2018-04-11 ENCOUNTER — Emergency Department (HOSPITAL_COMMUNITY)
Admission: EM | Admit: 2018-04-11 | Discharge: 2018-04-11 | Disposition: A | Discharge status: Home / Self Care | Payer: Medicaid Other | Attending: Emergency Medicine | Admitting: Emergency Medicine

## 2018-04-11 ENCOUNTER — Other Ambulatory Visit: Payer: Self-pay

## 2018-04-11 ENCOUNTER — Encounter (HOSPITAL_COMMUNITY): Payer: Self-pay

## 2018-04-11 DIAGNOSIS — Y939 Activity, unspecified: Secondary | ICD-10-CM | POA: Diagnosis not present

## 2018-04-11 DIAGNOSIS — Y929 Unspecified place or not applicable: Secondary | ICD-10-CM | POA: Diagnosis not present

## 2018-04-11 DIAGNOSIS — I5042 Chronic combined systolic (congestive) and diastolic (congestive) heart failure: Secondary | ICD-10-CM | POA: Diagnosis not present

## 2018-04-11 DIAGNOSIS — J449 Chronic obstructive pulmonary disease, unspecified: Secondary | ICD-10-CM | POA: Insufficient documentation

## 2018-04-11 DIAGNOSIS — Y999 Unspecified external cause status: Secondary | ICD-10-CM | POA: Insufficient documentation

## 2018-04-11 DIAGNOSIS — I10 Essential (primary) hypertension: Secondary | ICD-10-CM

## 2018-04-11 DIAGNOSIS — R079 Chest pain, unspecified: Secondary | ICD-10-CM | POA: Diagnosis not present

## 2018-04-11 DIAGNOSIS — F17210 Nicotine dependence, cigarettes, uncomplicated: Secondary | ICD-10-CM | POA: Diagnosis not present

## 2018-04-11 DIAGNOSIS — Z8673 Personal history of transient ischemic attack (TIA), and cerebral infarction without residual deficits: Secondary | ICD-10-CM | POA: Diagnosis not present

## 2018-04-11 DIAGNOSIS — R0602 Shortness of breath: Secondary | ICD-10-CM | POA: Diagnosis not present

## 2018-04-11 DIAGNOSIS — I110 Hypertensive heart disease with heart failure: Secondary | ICD-10-CM | POA: Diagnosis not present

## 2018-04-11 LAB — CBC WITH DIFFERENTIAL/PLATELET
Abs Immature Granulocytes: 0.02 10*3/uL (ref 0.00–0.07)
Basophils Absolute: 0.1 10*3/uL (ref 0.0–0.1)
Basophils Relative: 1 %
Eosinophils Absolute: 0.1 10*3/uL (ref 0.0–0.5)
Eosinophils Relative: 1 %
HCT: 34.6 % — ABNORMAL LOW (ref 39.0–52.0)
Hemoglobin: 12 g/dL — ABNORMAL LOW (ref 13.0–17.0)
Immature Granulocytes: 0 %
Lymphocytes Relative: 23 %
Lymphs Abs: 1.3 10*3/uL (ref 0.7–4.0)
MCH: 34.6 pg — AB (ref 26.0–34.0)
MCHC: 34.7 g/dL (ref 30.0–36.0)
MCV: 99.7 fL (ref 80.0–100.0)
MONO ABS: 0.6 10*3/uL (ref 0.1–1.0)
Monocytes Relative: 10 %
Neutro Abs: 3.6 10*3/uL (ref 1.7–7.7)
Neutrophils Relative %: 65 %
Platelets: 178 10*3/uL (ref 150–400)
RBC: 3.47 MIL/uL — ABNORMAL LOW (ref 4.22–5.81)
RDW: 13.7 % (ref 11.5–15.5)
WBC: 5.5 10*3/uL (ref 4.0–10.5)
nRBC: 0 % (ref 0.0–0.2)

## 2018-04-11 LAB — BASIC METABOLIC PANEL
Anion gap: 12 (ref 5–15)
BUN: 11 mg/dL (ref 6–20)
CALCIUM: 9.5 mg/dL (ref 8.9–10.3)
CO2: 20 mmol/L — ABNORMAL LOW (ref 22–32)
Chloride: 108 mmol/L (ref 98–111)
Creatinine, Ser: 0.94 mg/dL (ref 0.61–1.24)
GFR calc Af Amer: >60 mL/min (ref >60)
GFR calc non Af Amer: >60 mL/min (ref >60)
Glucose, Bld: 72 mg/dL (ref 70–99)
Potassium: 3.6 mmol/L (ref 3.5–5.1)
Sodium: 140 mmol/L (ref 135–145)

## 2018-04-11 LAB — BRAIN NATRIURETIC PEPTIDE: B Natriuretic Peptide: 274.2 pg/mL — ABNORMAL HIGH (ref 0.0–100.0)

## 2018-04-11 MED ORDER — NEBIVOLOL HCL 5 MG PO TABS: 5.0000 mg | ORAL_TABLET | Freq: Every day | ORAL | 0 refills | Status: DC

## 2018-04-11 MED ORDER — FUROSEMIDE 40 MG PO TABS: 40.0000 mg | ORAL_TABLET | Freq: Two times a day (BID) | ORAL | 0 refills | Status: DC

## 2018-04-11 MED ORDER — SPIRONOLACTONE 25 MG PO TABS: 25.0000 mg | ORAL_TABLET | Freq: Two times a day (BID) | ORAL | 0 refills | Status: DC

## 2018-04-11 MED ORDER — HYDRALAZINE HCL 50 MG PO TABS: 50.0000 mg | ORAL_TABLET | Freq: Three times a day (TID) | ORAL | 0 refills | Status: DC

## 2018-04-11 MED ORDER — SACUBITRIL-VALSARTAN 97-103 MG PO TABS: 1.0000 | ORAL_TABLET | Freq: Two times a day (BID) | ORAL | 0 refills | Status: DC

## 2018-04-11 NOTE — Discharge Instructions (Signed)
We signed the ER with concerns for defibrillator issues. The interrogation over here reveals that the defibrillator is still working fine.  X-ray does not reveal any signs of congestive heart failure.  We have prescribed you medications for your heart that you have been out of for the last 2 weeks.  Follow-up with cardiology soon as possible.
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
## 2018-04-11 NOTE — ED Notes (Signed)
[employer] called and stated that the pt's pacemaker/defibrillator report was good. No abnormal rhythms or defibrillations.

## 2018-04-11 NOTE — ED Notes (Signed)
Patient verbalizes understanding of discharge instructions. Opportunity for questioning and answers were provided. Armband removed by staff, pt discharged from ED.  
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
## 2018-04-12 DIAGNOSIS — I63511 Cerebral infarction due to unspecified occlusion or stenosis of right middle cerebral artery: Secondary | ICD-10-CM | POA: Diagnosis not present

## 2018-04-13 DIAGNOSIS — I63511 Cerebral infarction due to unspecified occlusion or stenosis of right middle cerebral artery: Secondary | ICD-10-CM | POA: Diagnosis not present

## 2018-04-13 MED FILL — BYSTOLIC 5 MG TABLET: 5 | 15 days supply | Qty: 15 | Fill #0

## 2018-04-13 MED FILL — FUROSEMIDE 40 MG TAB: 40 | 15 days supply | Qty: 30 | Fill #0

## 2018-04-13 MED FILL — SPIRONOLACTONE 25 MG TABLET: 25 | 15 days supply | Qty: 30 | Fill #0

## 2018-04-13 MED FILL — hydrALAZINE HCL 50 MG TABS: 50 | 15 days supply | Qty: 45 | Fill #0

## 2018-04-13 MED FILL — ENTRESTO 97 MG-103 MG TAB: 97-103 | 15 days supply | Qty: 30 | Fill #0

## 2018-04-14 DIAGNOSIS — I63511 Cerebral infarction due to unspecified occlusion or stenosis of right middle cerebral artery: Secondary | ICD-10-CM | POA: Diagnosis not present

## 2018-04-15 DIAGNOSIS — I63511 Cerebral infarction due to unspecified occlusion or stenosis of right middle cerebral artery: Secondary | ICD-10-CM | POA: Diagnosis not present

## 2018-04-16 DIAGNOSIS — I63511 Cerebral infarction due to unspecified occlusion or stenosis of right middle cerebral artery: Secondary | ICD-10-CM | POA: Diagnosis not present

## 2018-04-19 ENCOUNTER — Telehealth: Payer: Self-pay | Admitting: *Deleted

## 2018-04-19 DIAGNOSIS — I63511 Cerebral infarction due to unspecified occlusion or stenosis of right middle cerebral artery: Secondary | ICD-10-CM | POA: Diagnosis not present

## 2018-04-19 NOTE — Telephone Encounter (Signed)
Spoke with patient regarding appointment 4/1. Per patient he has not been checking his blood pressure at home. He will start checking. He stated his breathing is better since doctor filled his inhaler. Patient denies any chest pain or shortness of breath. He does not have the ability to do video visit, will forward to Dr [name] to see if telephone visit appropriated given the COVID 19

## 2018-04-20 DIAGNOSIS — I63511 Cerebral infarction due to unspecified occlusion or stenosis of right middle cerebral artery: Secondary | ICD-10-CM | POA: Diagnosis not present

## 2018-04-20 NOTE — Telephone Encounter (Signed)
Phone visit is fine.

## 2018-04-20 NOTE — Telephone Encounter (Signed)
Patient aware visit tomorrow will be phone visit

## 2018-04-21 ENCOUNTER — Encounter: Payer: Self-pay | Admitting: *Deleted

## 2018-04-21 ENCOUNTER — Telehealth (INDEPENDENT_AMBULATORY_CARE_PROVIDER_SITE_OTHER): Payer: Medicaid Other | Admitting: Cardiovascular Disease

## 2018-04-21 DIAGNOSIS — I5042 Chronic combined systolic (congestive) and diastolic (congestive) heart failure: Secondary | ICD-10-CM | POA: Diagnosis not present

## 2018-04-21 DIAGNOSIS — I110 Hypertensive heart disease with heart failure: Secondary | ICD-10-CM

## 2018-04-21 DIAGNOSIS — I428 Other cardiomyopathies: Secondary | ICD-10-CM

## 2018-04-21 DIAGNOSIS — I63511 Cerebral infarction due to unspecified occlusion or stenosis of right middle cerebral artery: Secondary | ICD-10-CM | POA: Diagnosis not present

## 2018-04-21 DIAGNOSIS — I426 Alcoholic cardiomyopathy: Secondary | ICD-10-CM

## 2018-04-21 DIAGNOSIS — I10 Essential (primary) hypertension: Secondary | ICD-10-CM
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
## 2018-04-21 NOTE — Patient Instructions (Addendum)
Medication Instructions:  Your physician recommends that you continue on your current medications as directed. Please refer to the Current Medication list given to you today.  If you need a refill on your cardiac medications before your next appointment, please call your pharmacy.   Lab work: NONE  Testing/Procedures: NONE   Follow-Up: KEEP APPOINTMENT WITH AMBER 05/04/18  Your physician wants you to follow-up in: 3 MONTHS WITH PA/NP You will receive a reminder letter in the mail two months in advance. If you don't receive a letter, please call our office to schedule the follow-up appointment.  Any Other Special Instructions Will Be Listed Below (If Applicable). CHECK YOUR BLOOD PRESSURE TWICE A DAY   FOLLOW UP WITH YOUR PRIMARY CARE DOCTOR IN 3 WEEKS

## 2018-04-22 DIAGNOSIS — I63511 Cerebral infarction due to unspecified occlusion or stenosis of right middle cerebral artery: Secondary | ICD-10-CM | POA: Diagnosis not present

## 2018-04-23 DIAGNOSIS — I63511 Cerebral infarction due to unspecified occlusion or stenosis of right middle cerebral artery: Secondary | ICD-10-CM | POA: Diagnosis not present

## 2018-04-26 DIAGNOSIS — I63511 Cerebral infarction due to unspecified occlusion or stenosis of right middle cerebral artery: Secondary | ICD-10-CM | POA: Diagnosis not present

## 2018-04-27 ENCOUNTER — Telehealth: Payer: Self-pay | Admitting: *Deleted

## 2018-04-27 DIAGNOSIS — I63511 Cerebral infarction due to unspecified occlusion or stenosis of right middle cerebral artery: Secondary | ICD-10-CM | POA: Diagnosis not present

## 2018-04-27 NOTE — Telephone Encounter (Signed)
Called patient to let them know due to recent COVID19 CDC and Health Department Protocols, we are not seeing patients in the office. We are instead seeing if they would like to schedule this appointment as a Pension scheme manager or Laptop. Patient is aware if they decide to reschedule this appointment, they may not be seen or scheduled for the next 4-6 months. Patient is agreeable to WebEx. He will get a family member to help him upload the app.  He does not know how to work his phone currently.  Patient will call us back if they are having issues uploading the app. Patient was advised to have pencil/pen and paper ready to take notes before, during and after the Virtual visit, to have BP, HR, and recent Wt, and any other concerns and questions ready prior to start of appointment.  Patient advised appointment times are set like a regular schedule and will begin possibly 10 minutes before set time. Appointment is not a floating schedule for that day.

## 2018-04-28 DIAGNOSIS — I63511 Cerebral infarction due to unspecified occlusion or stenosis of right middle cerebral artery: Secondary | ICD-10-CM | POA: Diagnosis not present

## 2018-04-29 ENCOUNTER — Telehealth: Payer: Self-pay | Admitting: *Deleted

## 2018-04-29 DIAGNOSIS — I63511 Cerebral infarction due to unspecified occlusion or stenosis of right middle cerebral artery: Secondary | ICD-10-CM | POA: Diagnosis not present

## 2018-04-29 NOTE — Telephone Encounter (Signed)
                                1. Confirm consent - Calling patient to let them know due to recent COVID19 CDC and Health Department Protocols, we are not seeing patients in the office. We are instead seeing if they would like to schedule this appointment as a  Virtual Appointment VIA Smartphone or Laptop. Patient is aware if they decide to reschedule this appointment, they may not be seen or scheduled for the next 4-6 months. As with many in-office visits we must obtain consent. If you'd like, we can send this to patient's mychart (if signed up).  Otherwise, we can obtain verbal consent now.  All virtual visits are billed to patient's insurance company just like a normal visit.  By patient agreeing to a virtual visit, patient is aware that the technology does not allow provider to perform a hands on physical examination, and thus may limit your provider's ability to fully assess your condition.  Patient is aware we cannot assure that it will always work on either patient or our end, and in the setting of a video visit, we may have to convert it to a phone-only visit.  In either situation, we cannot ensure that we have a secure connection. Is patient willing to proceed? YES   2. Give patient instructions for WebEx/Doximity download to smartphone as below if video visit   3. Patient advised to be prepared with any vital sign or heart rhythm information, their current medicines, and paper and pen handy for any instructions they may receive before, during, or after their visit.   4. Patient informed they will receive a phone call 10-15 minutes prior to their appointment time (may be from unknown caller ID) so they should be prepared to answer       DOWNLOADING THE WEBEX SOFTWARE TO SMARTPHONE    - If Android, ask patient to go to Google Play Store and type in WebEx in the search bar. Download [employer] Webex Meetings, the blue/green circle. The app is free but as with any other app downloads, their phone  may require them to verify saved payment information or Android password. The patient does NOT have to create an account.   ACCEPTING  DOXIMITY VIDEO VISITS  - Patient will receive a text message from a Random Number indicating Provider's Name and a Blue Hyperlink.  Text Message will say Provider sent you a secure message with a Blue Hyperlink Attached  -Patient is to click on Blue Hyperlink Text message will say "Provider will see you now. Click to join Video call." with another Blue Hyperlink attached.  -Patient is to click Blue hyperlink to Join Video call with said Provider.             

## 2018-04-30 DIAGNOSIS — I63511 Cerebral infarction due to unspecified occlusion or stenosis of right middle cerebral artery: Secondary | ICD-10-CM | POA: Diagnosis not present

## 2018-05-03 ENCOUNTER — Telehealth: Payer: Self-pay | Admitting: *Deleted

## 2018-05-03 DIAGNOSIS — I63511 Cerebral infarction due to unspecified occlusion or stenosis of right middle cerebral artery: Secondary | ICD-10-CM | POA: Diagnosis not present

## 2018-05-03 NOTE — Telephone Encounter (Signed)
Virtual Visit Pre-Appointment Phone Call  Steps For Call:  1. Confirm consent - "In the setting of the current Covid19 crisis, you are scheduled for a (phone or video) visit with your provider on (date) at (time).  Just as we do with many in-office visits, in order for you to participate in this visit, we must obtain consent.  If you'd like, I can send this to your mychart (if signed up) or email for you to review.  Otherwise, I can obtain your verbal consent now.  All virtual visits are billed to your insurance company just like a normal visit would be.  By agreeing to a virtual visit, we'd like you to understand that the technology does not allow for your provider to perform an examination, and thus may limit your provider's ability to fully assess your condition.  Finally, though the technology is pretty good, we cannot assure that it will always work on either your or our end, and in the setting of a video visit, we may have to convert it to a phone-only visit.  In either situation, we cannot ensure that we have a secure connection.  Are you willing to proceed?"  2. Give patient instructions for WebEx download to smartphone as below if video visit  3. Advise patient to be prepared with any vital sign or heart rhythm information, their current medicines, and a piece of paper and pen handy for any instructions they may receive the day of their visit  4. Inform patient they will receive a phone call 15 minutes prior to their appointment time (may be from unknown caller ID) so they should be prepared to answer  5. Confirm that appointment type is correct in Epic appointment notes (video vs telephone)    TELEPHONE CALL NOTE  [name] has been deemed a candidate for a follow-up tele-health visit to limit community exposure during the Covid-19 pandemic. I spoke with the patient via phone to ensure availability of phone/video source, confirm preferred email & phone number, and discuss  instructions and expectations.  I reminded [name] to be prepared with any vital sign and/or heart rhythm information that could potentially be obtained via home monitoring, at the time of his visit. I reminded [name] to expect a phone call at the time of his visit if his visit.  Did the patient verbally acknowledge consent to treatment? YES  Baird Lyons, RN 05/03/2018 2:29 PM   DOWNLOADING THE WEBEX SOFTWARE TO SMARTPHONE  - If Apple, go to [employer] and type in WebEx in the search bar. Download [employer], the blue/green circle. The app is free but as with any other app downloads, their phone may require them to verify saved payment information or Apple password. The patient does NOT have to create an account.  - If Android, ask patient to go to [employer] and type in WebEx in the search bar. Download [employer], the blue/green circle. The app is free but as with any other app downloads, their phone may require them to verify saved payment information or Android password. The patient does NOT have to create an account.   CONSENT FOR TELE-HEALTH VISIT - PLEASE REVIEW  I hereby voluntarily request, consent and authorize [facility] and its employed or contracted physicians, physician assistants, nurse practitioners or other licensed health care professionals (the Practitioner), to provide me with telemedicine health care services (the "Services") as deemed necessary by the treating Practitioner.  I acknowledge and consent to receive the Services by the Practitioner via telemedicine. I understand that the telemedicine visit will involve communicating with the Practitioner through live audiovisual communication technology and the disclosure of certain medical information by electronic transmission. I acknowledge that I have been given the opportunity to request an in-person assessment or other available alternative prior to the telemedicine visit and  am voluntarily participating in the telemedicine visit.  I understand that I have the right to withhold or withdraw my consent to the use of telemedicine in the course of my care at any time, without affecting my right to future care or treatment, and that the Practitioner or I may terminate the telemedicine visit at any time. I understand that I have the right to inspect all information obtained and/or recorded in the course of the telemedicine visit and may receive copies of available information for a reasonable fee.  I understand that some of the potential risks of receiving the Services via telemedicine include:  Marland Kitchen Delay or interruption in medical evaluation due to technological equipment failure or disruption; . Information transmitted may not be sufficient (e.g. poor resolution of images) to allow for appropriate medical decision making by the Practitioner; and/or  . In rare instances, security protocols could fail, causing a breach of personal health information.  Furthermore, I acknowledge that it is my responsibility to provide information about my medical history, conditions and care that is complete and accurate to the best of my ability. I acknowledge that Practitioner's advice, recommendations, and/or decision may be based on factors not within their control, such as incomplete or inaccurate data provided by me or distortions of diagnostic images or specimens that may result from electronic transmissions. I understand that the practice of medicine is not an exact science and that Practitioner makes no warranties or guarantees regarding treatment outcomes. I acknowledge that I will receive a copy of this consent concurrently upon execution via email to the email address I last provided but may also request a printed copy by calling the office of [place].    I understand that my insurance will be billed for this visit.   I have read or had this consent read to me. . I understand the  contents of this consent, which adequately explains the benefits and risks of the Services being provided via telemedicine.  . I have been provided ample opportunity to ask questions regarding this consent and the Services and have had my questions answered to my satisfaction. . I give my informed consent for the services to be provided through the use of telemedicine in my medical care  By participating in this telemedicine visit I agree to the above.

## 2018-05-04 ENCOUNTER — Telehealth: Payer: Self-pay | Admitting: Cardiology

## 2018-05-04 ENCOUNTER — Encounter: Payer: Self-pay | Admitting: Cardiology

## 2018-05-04 ENCOUNTER — Telehealth (INDEPENDENT_AMBULATORY_CARE_PROVIDER_SITE_OTHER): Payer: Medicaid Other | Admitting: Cardiology

## 2018-05-04 ENCOUNTER — Other Ambulatory Visit: Payer: Self-pay

## 2018-05-04 DIAGNOSIS — I5043 Acute on chronic combined systolic (congestive) and diastolic (congestive) heart failure: Secondary | ICD-10-CM | POA: Diagnosis not present

## 2018-05-04 DIAGNOSIS — I63511 Cerebral infarction due to unspecified occlusion or stenosis of right middle cerebral artery: Secondary | ICD-10-CM | POA: Diagnosis not present

## 2018-05-04 MED ORDER — NEBIVOLOL HCL 5 MG PO TABS: 10.0000 mg | ORAL_TABLET | Freq: Every day | ORAL | 3 refills | Status: DC

## 2018-05-04 MED ORDER — NEBIVOLOL HCL 10 MG PO TABS: 10.0000 mg | ORAL_TABLET | Freq: Every day | ORAL | 3 refills | Status: DC

## 2018-05-04 NOTE — Patient Instructions (Addendum)
Medication Instructions:  Your physician has recommended you make the following change in your medication: 1. INCREASE Bystolic to 10 mg daily  * If you need a refill on your cardiac medications before your next appointment, please call your pharmacy.   Labwork: None ordered  Testing/Procedures: None ordered  Follow-Up: Your physician recommends that you schedule a follow-up appointment in: July with Dr. Elberta Fortis  Thank you for choosing [facility]!!   Dory Horn, RN [phone]

## 2018-05-04 NOTE — Telephone Encounter (Signed)
Patient has appt today with Dr. Elberta Fortis, he states he missed a call, he was in the shower and was unable to get out in time.

## 2018-05-04 NOTE — Addendum Note (Signed)
Addended by: Baird Lyons on: 05/04/2018 04:28 PM   Modules accepted: Orders
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
## 2018-05-05 ENCOUNTER — Telehealth: Payer: Self-pay

## 2018-05-05 DIAGNOSIS — I63511 Cerebral infarction due to unspecified occlusion or stenosis of right middle cerebral artery: Secondary | ICD-10-CM | POA: Diagnosis not present

## 2018-05-05 NOTE — Telephone Encounter (Addendum)
I attempted to do a Bystolic PA through covermymeds but was unable as I was directed to call [employer] to do this PA.  I called [employer] at [phone] and did the Bystolic PA over the phone with Alexia.  Per Alexia this PA has been approved until 01/29/2019.  I have attempted to notified [facility] of this approval but have been on hold for more than 10 mins X 3. No answer and no way to leave a VM.

## 2018-05-06 DIAGNOSIS — I63511 Cerebral infarction due to unspecified occlusion or stenosis of right middle cerebral artery: Secondary | ICD-10-CM | POA: Diagnosis not present

## 2018-05-07 DIAGNOSIS — I63511 Cerebral infarction due to unspecified occlusion or stenosis of right middle cerebral artery: Secondary | ICD-10-CM | POA: Diagnosis not present

## 2018-05-08 MED FILL — PROAIR HFA 90 MCG INHALER: 108 (90 BAS | 25 days supply | Qty: 9 | Fill #1

## 2018-05-08 MED FILL — SPIRIVA 18 MCG CP-HANDIHALE: 18 | 30 days supply | Qty: 30 | Fill #1

## 2018-05-08 MED FILL — BYSTOLIC 10 MG TABLET: 10 | 30 days supply | Qty: 30 | Fill #0

## 2018-05-10 ENCOUNTER — Other Ambulatory Visit: Payer: Self-pay

## 2018-05-10 ENCOUNTER — Encounter: Payer: Self-pay | Admitting: Family Medicine

## 2018-05-10 ENCOUNTER — Ambulatory Visit: Payer: Medicaid Other | Attending: Family Medicine | Admitting: Family Medicine

## 2018-05-10 DIAGNOSIS — L308 Other specified dermatitis: Secondary | ICD-10-CM | POA: Diagnosis not present

## 2018-05-10 DIAGNOSIS — I10 Essential (primary) hypertension: Secondary | ICD-10-CM | POA: Diagnosis not present

## 2018-05-10 DIAGNOSIS — R252 Cramp and spasm: Secondary | ICD-10-CM | POA: Diagnosis not present

## 2018-05-10 DIAGNOSIS — I5042 Chronic combined systolic (congestive) and diastolic (congestive) heart failure: Secondary | ICD-10-CM | POA: Diagnosis not present

## 2018-05-10 DIAGNOSIS — I63511 Cerebral infarction due to unspecified occlusion or stenosis of right middle cerebral artery: Secondary | ICD-10-CM | POA: Diagnosis not present

## 2018-05-10 MED ORDER — TRIAMCINOLONE ACETONIDE 0.1 % EX CREA: 1.0000 "application " | TOPICAL_CREAM | Freq: Two times a day (BID) | CUTANEOUS | 1 refills | Status: DC

## 2018-05-10 MED ORDER — CYCLOBENZAPRINE HCL 10 MG PO TABS: 10.0000 mg | ORAL_TABLET | Freq: Every day | ORAL | 1 refills | Status: DC

## 2018-05-10 MED FILL — TRIAMCINOLONE ACETONIDE 0.1: 0.1 | 30 days supply | Qty: 80 | Fill #0

## 2018-05-10 MED FILL — CYCLOBENZAPRINE 10 MG TAB: 10 | 60 days supply | Qty: 60 | Fill #0

## 2018-05-10 NOTE — Progress Notes (Signed)
Patient has been called and DOB has been verified. Patient has been screened and transferred to PCP to start phone visit.  C/C: Hypertension, leg cramps.

## 2018-05-10 NOTE — Progress Notes (Signed)
Virtual Visit via Telephone Note  I connected with [name], on 05/10/2018 at 3:52 PM by telephone and verified that I am speaking with the correct person using two identifiers.   Consent: I discussed the limitations, risks, security and privacy concerns of performing an evaluation and management service by telephone and the availability of in person appointments. I also discussed with the patient that there may be a patient responsible charge related to this service. The patient expressed understanding and agreed to proceed.   Location of Patient: Patient's car  Location of Provider: Clinic   Persons participating in Telemedicine visit: Nishal Strenger Farrington-CMA Dr. 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 He complains of eczematous rash on his anterior and posterior trunk which is pruritic. He also complains of cramps in his feet and legs which occur at night when he tries to stretch and the cramps spread through his body to his chest wall muscles.  He describes this as his arms locking up and he has tried mustard with no relief.  Symptoms are absent during the day. His last potassium was 3.6.   He denies chest pain, dyspnea, palpitations, pedal edema. His last blood pressure was 179/90.  At the telemedicine visit with his EP cardiologist his Bystolic was increased but he is yet to receive this new prescription.  His last device check was in 02/01/2018 which revealed one episode of SVT, otherwise normal device function  Past Medical History:  Diagnosis Date  . CHF (congestive heart  failure) (HCC)   . Headache   . Hypertension   . Stroke (HCC) 02/24/2015   Allergies  Allergen Reactions  . Penicillins Other (See Comments)    Blisters  Has patient had a PCN reaction causing immediate rash, facial/tongue/throat swelling, SOB or lightheadedness with hypotension: Yes Has patient had a PCN reaction causing severe rash involving mucus membranes or skin necrosis: Yes Has patient had a PCN reaction that required hospitalization: No Has patient had a PCN reaction occurring within the last 10 years: No If all of the above answers are "NO", then may proceed with Cephalosporin use.     Current Outpatient Medications on File Prior to Visit  Medication Sig Dispense Refill  . cetirizine (ZYRTEC) 10 MG tablet Take 1 tablet (10 mg total) by mouth daily. 30 tablet 1  . magnesium oxide (MAG-OX) 400 MG tablet Take 1 tablet (400 mg total) by mouth daily. 30 tablet 5  . Multiple Vitamin (MULTIVITAMIN WITH MINERALS) TABS tablet Take 1 tablet by mouth daily.    . nebivolol (BYSTOLIC) 10 MG tablet Take 1 tablet (10 mg total) by mouth daily. 30 tablet 3  . PROAIR HFA 108 (90 Base) MCG/ACT inhaler INHALE 2 PUFFS INTO THE LUNGS EVERY 6 (SIX) HOURS AS NEEDED FOR WHEEZING OR SHORTNESS OF BREATH. 8.5 g 2  . sacubitril-valsartan (ENTRESTO) 97-103 MG Take 1 tablet by mouth 2 (two) times daily. 30 tablet 0  . tiotropium (SPIRIVA HANDIHALER) 18 MCG inhalation capsule Place 1 capsule (18 mcg total) into inhaler and inhale daily. 30 capsule 6  . furosemide (LASIX) 40 MG tablet Take 1 tablet (40 mg total)  by mouth 2 (two) times daily for 15 days. 30 tablet 0  . hydrALAZINE (APRESOLINE) 50 MG tablet Take 1 tablet (50 mg total) by mouth 3 (three) times daily for 15 days. 45 tablet 0  . spironolactone (ALDACTONE) 25 MG tablet Take 1 tablet (25 mg total) by mouth 2 (two) times daily for 15 days. 30 tablet 0   No current facility-administered medications on file prior to visit.      Observations/Objective: Awake, alert, oriented x3  Not in acute distress   CMP Latest Ref Rng & Units 04/11/2018 12/23/2017 11/02/2017  Glucose 70 - 99 mg/dL 72 79 86  BUN 6 - 20 mg/dL 11 7 17   Creatinine 0.61 - 1.24 mg/dL 7.09 6.28 3.66  Sodium 135 - 145 mmol/L 140 139 138  Potassium 3.5 - 5.1 mmol/L 3.6 4.2 4.1  Chloride 98 - 111 mmol/L 108 102 99  CO2 22 - 32 mmol/L 20(L) 23 22  Calcium 8.9 - 10.3 mg/dL 9.5 9.6 9.9  Total Protein 6.5 - 8.1 g/dL - - -  Total Bilirubin 0.3 - 1.2 mg/dL - - -  Alkaline Phos 38 - 126 U/L - - -  AST 15 - 41 U/L - - -  ALT 0 - 44 U/L - - -    Lipid Panel     Component Value Date/Time   CHOL 158 09/10/2016 1001   TRIG 69 09/10/2016 1001   HDL 100 09/10/2016 1001   CHOLHDL 1.6 09/10/2016 1001   CHOLHDL 1.6 02/25/2015 0328   VLDL 27 02/25/2015 0328   LDLCALC 44 09/10/2016 1001     Assessment and Plan: 1. Muscle cramp Last potassium was normal at 3.6 Advised to commence muscle relaxant which is sedating any to use at nighttime Keep feet warm at night - cyclobenzaprine (FLEXERIL) 10 MG tablet; Take 1 tablet (10 mg total) by mouth at bedtime.  Dispense: 30 tablet; Refill: 1  2. Other eczema - triamcinolone cream (KENALOG) 0.1 %; Apply 1 application topically 2 (two) times daily.  Dispense: 80 g; Refill: 1  3. Benign essential HTN Uncontrolled Bystolic dose was recently increased by EP cardiology and he is yet to obtain the new dose Counseled on blood pressure goal of less than 130/80, low-sodium, DASH diet, medication compliance, 150 minutes of moderate intensity exercise per week. Discussed medication compliance, adverse effects.  4. Chronic combined systolic and diastolic CHF (congestive heart failure) (HCC) Status post ICD EF 20 to 25% from 10/2017 Continue Entresto, Lasix Heart healthy diet.   Follow Up Instructions: Return in about 3 months (around 08/09/2018).    I discussed the assessment and treatment plan with the  patient. The patient was provided an opportunity to ask questions and all were answered. The patient agreed with the plan and demonstrated an understanding of the instructions.   The patient was advised to call back or seek an in-person evaluation if the symptoms worsen or if the condition fails to improve as anticipated.     I provided 17 minutes total of non-face-to-face time during this encounter including median intraservice time, reviewing previous notes, labs, imaging, medications and explaining diagnosis and management.     Hoy Register, MD, FAAFP. [facility] [place], [place] [phone]   05/10/2018, 3:52 PM

## 2018-05-11 DIAGNOSIS — I63511 Cerebral infarction due to unspecified occlusion or stenosis of right middle cerebral artery: Secondary | ICD-10-CM | POA: Diagnosis not present

## 2018-05-12 DIAGNOSIS — I63511 Cerebral infarction due to unspecified occlusion or stenosis of right middle cerebral artery: Secondary | ICD-10-CM | POA: Diagnosis not present

## 2018-05-13 DIAGNOSIS — I63511 Cerebral infarction due to unspecified occlusion or stenosis of right middle cerebral artery: Secondary | ICD-10-CM | POA: Diagnosis not present

## 2018-05-14 DIAGNOSIS — I63511 Cerebral infarction due to unspecified occlusion or stenosis of right middle cerebral artery: Secondary | ICD-10-CM | POA: Diagnosis not present

## 2018-05-17 DIAGNOSIS — I63511 Cerebral infarction due to unspecified occlusion or stenosis of right middle cerebral artery: Secondary | ICD-10-CM | POA: Diagnosis not present

## 2018-05-18 DIAGNOSIS — I63511 Cerebral infarction due to unspecified occlusion or stenosis of right middle cerebral artery: Secondary | ICD-10-CM | POA: Diagnosis not present

## 2018-05-19 DIAGNOSIS — I63511 Cerebral infarction due to unspecified occlusion or stenosis of right middle cerebral artery: Secondary | ICD-10-CM | POA: Diagnosis not present

## 2018-05-20 DIAGNOSIS — I63511 Cerebral infarction due to unspecified occlusion or stenosis of right middle cerebral artery: Secondary | ICD-10-CM | POA: Diagnosis not present

## 2018-05-21 DIAGNOSIS — I63511 Cerebral infarction due to unspecified occlusion or stenosis of right middle cerebral artery: Secondary | ICD-10-CM | POA: Diagnosis not present

## 2018-05-24 DIAGNOSIS — I63511 Cerebral infarction due to unspecified occlusion or stenosis of right middle cerebral artery: Secondary | ICD-10-CM | POA: Diagnosis not present

## 2018-05-25 DIAGNOSIS — I63511 Cerebral infarction due to unspecified occlusion or stenosis of right middle cerebral artery: Secondary | ICD-10-CM | POA: Diagnosis not present

## 2018-05-26 DIAGNOSIS — I63511 Cerebral infarction due to unspecified occlusion or stenosis of right middle cerebral artery: Secondary | ICD-10-CM | POA: Diagnosis not present

## 2018-05-27 DIAGNOSIS — I63511 Cerebral infarction due to unspecified occlusion or stenosis of right middle cerebral artery: Secondary | ICD-10-CM | POA: Diagnosis not present

## 2018-05-28 DIAGNOSIS — I63511 Cerebral infarction due to unspecified occlusion or stenosis of right middle cerebral artery: Secondary | ICD-10-CM | POA: Diagnosis not present

## 2018-05-31 DIAGNOSIS — I63511 Cerebral infarction due to unspecified occlusion or stenosis of right middle cerebral artery: Secondary | ICD-10-CM | POA: Diagnosis not present

## 2018-06-01 DIAGNOSIS — I63511 Cerebral infarction due to unspecified occlusion or stenosis of right middle cerebral artery: Secondary | ICD-10-CM | POA: Diagnosis not present

## 2018-06-02 DIAGNOSIS — I63511 Cerebral infarction due to unspecified occlusion or stenosis of right middle cerebral artery: Secondary | ICD-10-CM | POA: Diagnosis not present

## 2018-06-03 DIAGNOSIS — I63511 Cerebral infarction due to unspecified occlusion or stenosis of right middle cerebral artery: Secondary | ICD-10-CM | POA: Diagnosis not present

## 2018-06-04 DIAGNOSIS — I63511 Cerebral infarction due to unspecified occlusion or stenosis of right middle cerebral artery: Secondary | ICD-10-CM | POA: Diagnosis not present

## 2018-06-07 DIAGNOSIS — I63511 Cerebral infarction due to unspecified occlusion or stenosis of right middle cerebral artery: Secondary | ICD-10-CM | POA: Diagnosis not present

## 2018-06-08 DIAGNOSIS — I63511 Cerebral infarction due to unspecified occlusion or stenosis of right middle cerebral artery: Secondary | ICD-10-CM | POA: Diagnosis not present

## 2018-06-09 DIAGNOSIS — I63511 Cerebral infarction due to unspecified occlusion or stenosis of right middle cerebral artery: Secondary | ICD-10-CM | POA: Diagnosis not present

## 2018-06-10 DIAGNOSIS — I63511 Cerebral infarction due to unspecified occlusion or stenosis of right middle cerebral artery: Secondary | ICD-10-CM | POA: Diagnosis not present

## 2018-06-11 DIAGNOSIS — I63511 Cerebral infarction due to unspecified occlusion or stenosis of right middle cerebral artery: Secondary | ICD-10-CM | POA: Diagnosis not present

## 2018-06-14 DIAGNOSIS — I63511 Cerebral infarction due to unspecified occlusion or stenosis of right middle cerebral artery: Secondary | ICD-10-CM | POA: Diagnosis not present

## 2018-06-15 DIAGNOSIS — I63511 Cerebral infarction due to unspecified occlusion or stenosis of right middle cerebral artery: Secondary | ICD-10-CM | POA: Diagnosis not present

## 2018-06-16 DIAGNOSIS — I63511 Cerebral infarction due to unspecified occlusion or stenosis of right middle cerebral artery: Secondary | ICD-10-CM | POA: Diagnosis not present

## 2018-06-17 DIAGNOSIS — I63511 Cerebral infarction due to unspecified occlusion or stenosis of right middle cerebral artery: Secondary | ICD-10-CM | POA: Diagnosis not present

## 2018-06-18 DIAGNOSIS — I63511 Cerebral infarction due to unspecified occlusion or stenosis of right middle cerebral artery: Secondary | ICD-10-CM | POA: Diagnosis not present

## 2018-06-21 DIAGNOSIS — I63511 Cerebral infarction due to unspecified occlusion or stenosis of right middle cerebral artery: Secondary | ICD-10-CM | POA: Diagnosis not present

## 2018-06-22 DIAGNOSIS — I63511 Cerebral infarction due to unspecified occlusion or stenosis of right middle cerebral artery: Secondary | ICD-10-CM | POA: Diagnosis not present

## 2018-06-23 DIAGNOSIS — I63511 Cerebral infarction due to unspecified occlusion or stenosis of right middle cerebral artery: Secondary | ICD-10-CM | POA: Diagnosis not present

## 2018-06-24 DIAGNOSIS — I63511 Cerebral infarction due to unspecified occlusion or stenosis of right middle cerebral artery: Secondary | ICD-10-CM | POA: Diagnosis not present

## 2018-06-25 DIAGNOSIS — I63511 Cerebral infarction due to unspecified occlusion or stenosis of right middle cerebral artery: Secondary | ICD-10-CM | POA: Diagnosis not present

## 2018-06-28 DIAGNOSIS — I63511 Cerebral infarction due to unspecified occlusion or stenosis of right middle cerebral artery: Secondary | ICD-10-CM | POA: Diagnosis not present

## 2018-06-29 DIAGNOSIS — I63511 Cerebral infarction due to unspecified occlusion or stenosis of right middle cerebral artery: Secondary | ICD-10-CM | POA: Diagnosis not present

## 2018-06-30 DIAGNOSIS — I63511 Cerebral infarction due to unspecified occlusion or stenosis of right middle cerebral artery: Secondary | ICD-10-CM | POA: Diagnosis not present

## 2018-07-01 DIAGNOSIS — I63511 Cerebral infarction due to unspecified occlusion or stenosis of right middle cerebral artery: Secondary | ICD-10-CM | POA: Diagnosis not present

## 2018-07-02 DIAGNOSIS — I63511 Cerebral infarction due to unspecified occlusion or stenosis of right middle cerebral artery: Secondary | ICD-10-CM | POA: Diagnosis not present

## 2018-07-05 DIAGNOSIS — I63511 Cerebral infarction due to unspecified occlusion or stenosis of right middle cerebral artery: Secondary | ICD-10-CM | POA: Diagnosis not present

## 2018-07-06 DIAGNOSIS — I63511 Cerebral infarction due to unspecified occlusion or stenosis of right middle cerebral artery: Secondary | ICD-10-CM | POA: Diagnosis not present

## 2018-07-07 DIAGNOSIS — I63511 Cerebral infarction due to unspecified occlusion or stenosis of right middle cerebral artery: Secondary | ICD-10-CM | POA: Diagnosis not present

## 2018-07-08 DIAGNOSIS — I63511 Cerebral infarction due to unspecified occlusion or stenosis of right middle cerebral artery: Secondary | ICD-10-CM | POA: Diagnosis not present

## 2018-07-09 DIAGNOSIS — I63511 Cerebral infarction due to unspecified occlusion or stenosis of right middle cerebral artery: Secondary | ICD-10-CM | POA: Diagnosis not present

## 2018-07-12 DIAGNOSIS — I63511 Cerebral infarction due to unspecified occlusion or stenosis of right middle cerebral artery: Secondary | ICD-10-CM | POA: Diagnosis not present

## 2018-07-13 DIAGNOSIS — I63511 Cerebral infarction due to unspecified occlusion or stenosis of right middle cerebral artery: Secondary | ICD-10-CM | POA: Diagnosis not present

## 2018-07-14 DIAGNOSIS — I63511 Cerebral infarction due to unspecified occlusion or stenosis of right middle cerebral artery: Secondary | ICD-10-CM | POA: Diagnosis not present

## 2018-07-15 ENCOUNTER — Encounter

## 2018-07-15 DIAGNOSIS — I63511 Cerebral infarction due to unspecified occlusion or stenosis of right middle cerebral artery: Secondary | ICD-10-CM | POA: Diagnosis not present

## 2018-07-16 ENCOUNTER — Telehealth: Payer: Self-pay | Admitting: Cardiology

## 2018-07-16 ENCOUNTER — Telehealth: Payer: Self-pay

## 2018-07-16 DIAGNOSIS — I5042 Chronic combined systolic (congestive) and diastolic (congestive) heart failure: Secondary | ICD-10-CM

## 2018-07-16 DIAGNOSIS — I10 Essential (primary) hypertension: Secondary | ICD-10-CM

## 2018-07-16 DIAGNOSIS — I63511 Cerebral infarction due to unspecified occlusion or stenosis of right middle cerebral artery: Secondary | ICD-10-CM | POA: Diagnosis not present

## 2018-07-16 MED ORDER — FUROSEMIDE 40 MG PO TABS: 40.0000 mg | ORAL_TABLET | Freq: Every day | ORAL | 0 refills | Status: DC

## 2018-07-16 NOTE — Telephone Encounter (Signed)
Patient states he is having issues with Lasix. He states he notices that after he urinates he has cramping and feels dehydrated.   Spoke with [name] and he stated patient should cut his dose in half and we will see him Tuesday to evaluate him further.   Gave patient [name]'s instructions and he voiced understanding.

## 2018-07-16 NOTE — Telephone Encounter (Signed)
smartphone/ consent/ my chart declined/ pre reg completed  °

## 2018-07-19 DIAGNOSIS — I63511 Cerebral infarction due to unspecified occlusion or stenosis of right middle cerebral artery: Secondary | ICD-10-CM | POA: Diagnosis not present

## 2018-07-20 ENCOUNTER — Telehealth: Payer: Self-pay

## 2018-07-20 ENCOUNTER — Telehealth (INDEPENDENT_AMBULATORY_CARE_PROVIDER_SITE_OTHER): Payer: Medicaid Other | Admitting: Cardiology

## 2018-07-20 ENCOUNTER — Encounter: Payer: Self-pay | Admitting: Cardiology

## 2018-07-20 VITALS — BP 182/102 | HR 85 | Ht 68.0 in | Wt 148.0 lb

## 2018-07-20 DIAGNOSIS — F1010 Alcohol abuse, uncomplicated: Secondary | ICD-10-CM

## 2018-07-20 DIAGNOSIS — Z8673 Personal history of transient ischemic attack (TIA), and cerebral infarction without residual deficits: Secondary | ICD-10-CM

## 2018-07-20 DIAGNOSIS — G4733 Obstructive sleep apnea (adult) (pediatric): Secondary | ICD-10-CM | POA: Diagnosis not present

## 2018-07-20 DIAGNOSIS — I10 Essential (primary) hypertension: Secondary | ICD-10-CM | POA: Diagnosis not present

## 2018-07-20 DIAGNOSIS — I63511 Cerebral infarction due to unspecified occlusion or stenosis of right middle cerebral artery: Secondary | ICD-10-CM | POA: Diagnosis not present

## 2018-07-20 DIAGNOSIS — Z95810 Presence of automatic (implantable) cardiac defibrillator: Secondary | ICD-10-CM

## 2018-07-20 DIAGNOSIS — I428 Other cardiomyopathies: Secondary | ICD-10-CM

## 2018-07-20 NOTE — Patient Instructions (Addendum)
Medication Instructions:  INCREASE Hydralazine to 100mg  Take 1 tablet three times a day (if patient is not taking the 50mg  one tablet three times a day start this dose first and dont increase to the 100mg  tablet)  If you need a refill on your cardiac medications before your next appointment, please call your pharmacy.   Lab work: None  If you have labs (blood work) drawn today and your tests are completely normal, you will receive your results only by: Marland Kitchen MyChart Message (if you have MyChart) OR . A paper copy in the mail If you have any lab test that is abnormal or we need to change your treatment, we will call you to review the results.  Testing/Procedures: None  Follow-Up: At [facility], you and your health needs are our priority.  As part of our continuing mission to provide you with exceptional heart care, we have created designated Provider Care Teams.  These Care Teams include your primary Cardiologist (physician) and Advanced Practice Providers (APPs -  Physician Assistants and Nurse Practitioners) who all work together to provide you with the care you need, when you need it. . Your physician recommends that you schedule a follow-up appointment in: 1 week with Kerin Ransom, PA-C  Any Other Special Instructions Will Be Listed Below (If Applicable). BRING ALL YOUR MEDICATIONS WITH YOU TO YOUR NEXT APPT AND MAKE SURE YOUR SISTER ACCOMPANIES YOU.

## 2018-07-20 NOTE — Telephone Encounter (Signed)
Called patients sister to discuss medication changes. Patient is unaware of what medications he is taking and stated his sister handles his meds. Left message for patients sister to contact office to discuss medications and in office follow up appointment.   Contacted patient to discuss AVS Instructions. Gave patient [name]'s recommendations from today's virtual office visit. Informed patient that we need to speak with his sister to discuss his meds. He stated his sister is a Glass blower/designer and she is working from home so it will be hard for her to come with him to his appt. I told him our other option is for [name] to call her on the phone during his appointment. Patient voiced understanding and AVS mailed.

## 2018-07-20 NOTE — Telephone Encounter (Signed)
Called patients sister to discuss medication changes. Unable to reach her and left a detailed message of the instructions typed on the AVS. Advised her to contact the office with any questions or concerns.
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
## 2018-07-21 ENCOUNTER — Other Ambulatory Visit: Payer: Self-pay

## 2018-07-21 DIAGNOSIS — I63511 Cerebral infarction due to unspecified occlusion or stenosis of right middle cerebral artery: Secondary | ICD-10-CM | POA: Diagnosis not present

## 2018-07-21 MED ORDER — HYDRALAZINE HCL 100 MG PO TABS: 100.0000 mg | ORAL_TABLET | Freq: Three times a day (TID) | ORAL | 0 refills | Status: DC

## 2018-07-21 MED FILL — hydrALAZINE HCL 100 MG TABS: 100 | 30 days supply | Qty: 90 | Fill #0

## 2018-07-21 NOTE — Telephone Encounter (Signed)
VERBAL CONSENT GIVEN TO NICOLE CURTIS ON 07/16/2018 AT 10:35AM.      Virtual Visit Pre-Appointment Phone Call  "(MR Covell, I am calling you today to discuss your upcoming appointment. We are currently trying to limit exposure to the virus that causes COVID-19 by seeing patients at home rather than in the office."  1. "What is the BEST phone number to call the day of the visit?" - include this in appointment notes  2. "Do you have or have access to (through a family member/friend) a smartphone with video capability that we can use for your visit?" a. If yes - list this number in appt notes as "cell" (if different from BEST phone #) and list the appointment type as a VIDEO visit in appointment notes b. If no - list the appointment type as a PHONE visit in appointment notes  3. Confirm consent - "In the setting of the current Covid19 crisis, you are scheduled for a (phone or video) visit with your provider on (date) at (time).  Just as we do with many in-office visits, in order for you to participate in this visit, we must obtain consent.  If you'd like, I can send this to your mychart (if signed up) or email for you to review.  Otherwise, I can obtain your verbal consent now.  All virtual visits are billed to your insurance company just like a normal visit would be.  By agreeing to a virtual visit, we'd like you to understand that the technology does not allow for your provider to perform an examination, and thus may limit your provider's ability to fully assess your condition. If your provider identifies any concerns that need to be evaluated in person, we will make arrangements to do so.  Finally, though the technology is pretty good, we cannot assure that it will always work on either your or our end, and in the setting of a video visit, we may have to convert it to a phone-only visit.  In either situation, we cannot ensure that we have a secure connection.  Are you willing to proceed?" STAFF: Did  the patient verbally acknowledge consent to telehealth visit? Document YES/NO here: YES  4. Advise patient to be prepared - "Two hours prior to your appointment, go ahead and check your blood pressure, pulse, oxygen saturation, and your weight (if you have the equipment to check those) and write them all down. When your visit starts, your provider will ask you for this information. If you have an Apple Watch or Kardia device, please plan to have heart rate information ready on the day of your appointment. Please have a pen and paper handy nearby the day of the visit as well."  5. Give patient instructions for MyChart download to smartphone OR [email or web address] as below if video visit (depending on what platform provider is using)  6. Inform patient they will receive a phone call 15 minutes prior to their appointment time (may be from unknown caller ID) so they should be prepared to answer    TELEPHONE CALL NOTE  MCNEIL VALLO has been deemed a candidate for a follow-up tele-health visit to limit community exposure during the Covid-19 pandemic. I spoke with the patient via phone to ensure availability of phone/video source, confirm preferred email & phone number, and discuss instructions and expectations.  I reminded CLETIS STEVEN to be prepared with any vital sign and/or heart rhythm information that could potentially be obtained via home monitoring, at the time of  his visit. I reminded BRANT PEETS to expect a phone call prior to his visit.  Harold Hedge, CMA 07/21/2018 4:08 PM   INSTRUCTIONS FOR DOWNLOADING THE MYCHART APP TO SMARTPHONE  - The patient must first make sure to have activated MyChart and know their login information - If Apple, go to [employer] and type in MyChart in the search bar and download the app. If Android, ask patient to go to [employer] and type in [place] in the search bar and download the app. The app is free but as with any other app downloads,  their phone may require them to verify saved payment information or Apple/Android password.  - The patient will need to then log into the app with their MyChart username and password, and select [place] as their healthcare provider to link the account. When it is time for your visit, go to the MyChart app, find appointments, and click Begin Video Visit. Be sure to Select Allow for your device to access the Microphone and Camera for your visit. You will then be connected, and your provider will be with you shortly.  **If they have any issues connecting, or need assistance please contact MyChart service desk [phone])**  **If using a computer, in order to ensure the best quality for their visit they will need to use either of the following Internet Browsers: [employer], or Google Chrome**  IF USING DOXIMITY or DOXY.ME - The patient will receive a link just prior to their visit by text.     FULL LENGTH CONSENT FOR TELE-HEALTH VISIT   I hereby voluntarily request, consent and authorize [place] and its employed or contracted physicians, physician assistants, nurse practitioners or other licensed health care professionals (the Practitioner), to provide me with telemedicine health care services (the "Services") as deemed necessary by the treating Practitioner. I acknowledge and consent to receive the Services by the Practitioner via telemedicine. I understand that the telemedicine visit will involve communicating with the Practitioner through live audiovisual communication technology and the disclosure of certain medical information by electronic transmission. I acknowledge that I have been given the opportunity to request an in-person assessment or other available alternative prior to the telemedicine visit and am voluntarily participating in the telemedicine visit.  I understand that I have the right to withhold or withdraw my consent to the use of telemedicine in the  course of my care at any time, without affecting my right to future care or treatment, and that the Practitioner or I may terminate the telemedicine visit at any time. I understand that I have the right to inspect all information obtained and/or recorded in the course of the telemedicine visit and may receive copies of available information for a reasonable fee.  I understand that some of the potential risks of receiving the Services via telemedicine include:  Marland Kitchen Delay or interruption in medical evaluation due to technological equipment failure or disruption; . Information transmitted may not be sufficient (e.g. poor resolution of images) to allow for appropriate medical decision making by the Practitioner; and/or  . In rare instances, security protocols could fail, causing a breach of personal health information.  Furthermore, I acknowledge that it is my responsibility to provide information about my medical history, conditions and care that is complete and accurate to the best of my ability. I acknowledge that Practitioner's advice, recommendations, and/or decision may be based on factors not within their control, such as incomplete or inaccurate data provided by  me or distortions of diagnostic images or specimens that may result from electronic transmissions. I understand that the practice of medicine is not an exact science and that Practitioner makes no warranties or guarantees regarding treatment outcomes. I acknowledge that I will receive a copy of this consent concurrently upon execution via email to the email address I last provided but may also request a printed copy by calling the office of [place].    I understand that my insurance will be billed for this visit.   I have read or had this consent read to me. . I understand the contents of this consent, which adequately explains the benefits and risks of the Services being provided via telemedicine.  . I have been provided ample  opportunity to ask questions regarding this consent and the Services and have had my questions answered to my satisfaction. . I give my informed consent for the services to be provided through the use of telemedicine in my medical care  By participating in this telemedicine visit I agree to the above.

## 2018-07-22 ENCOUNTER — Telehealth: Payer: Self-pay | Admitting: Cardiology

## 2018-07-22 DIAGNOSIS — I63511 Cerebral infarction due to unspecified occlusion or stenosis of right middle cerebral artery: Secondary | ICD-10-CM | POA: Diagnosis not present

## 2018-07-22 NOTE — Telephone Encounter (Signed)
LVM for pt, reminding him of his appt on 07-26-18 with Kerin Ransom.

## 2018-07-23 DIAGNOSIS — I63511 Cerebral infarction due to unspecified occlusion or stenosis of right middle cerebral artery: Secondary | ICD-10-CM | POA: Diagnosis not present

## 2018-07-26 ENCOUNTER — Other Ambulatory Visit: Payer: Self-pay

## 2018-07-26 ENCOUNTER — Ambulatory Visit (INDEPENDENT_AMBULATORY_CARE_PROVIDER_SITE_OTHER): Payer: Medicaid Other | Admitting: Cardiology

## 2018-07-26 ENCOUNTER — Encounter: Payer: Self-pay | Admitting: Cardiology

## 2018-07-26 VITALS — BP 160/93 | HR 85 | Temp 97.9°F | Ht 68.0 in | Wt 146.2 lb

## 2018-07-26 DIAGNOSIS — I63511 Cerebral infarction due to unspecified occlusion or stenosis of right middle cerebral artery: Secondary | ICD-10-CM | POA: Diagnosis not present

## 2018-07-26 DIAGNOSIS — Z8673 Personal history of transient ischemic attack (TIA), and cerebral infarction without residual deficits: Secondary | ICD-10-CM

## 2018-07-26 DIAGNOSIS — Z95810 Presence of automatic (implantable) cardiac defibrillator: Secondary | ICD-10-CM

## 2018-07-26 DIAGNOSIS — I10 Essential (primary) hypertension: Secondary | ICD-10-CM | POA: Diagnosis not present

## 2018-07-26 DIAGNOSIS — I428 Other cardiomyopathies: Secondary | ICD-10-CM | POA: Diagnosis not present

## 2018-07-26 DIAGNOSIS — I5042 Chronic combined systolic (congestive) and diastolic (congestive) heart failure: Secondary | ICD-10-CM

## 2018-07-26 MED ORDER — FUROSEMIDE 40 MG PO TABS: 40.0000 mg | ORAL_TABLET | Freq: Every day | ORAL | 6 refills | Status: DC

## 2018-07-26 MED ORDER — HYDRALAZINE HCL 50 MG PO TABS: 50.0000 mg | ORAL_TABLET | Freq: Two times a day (BID) | ORAL | 6 refills | Status: DC

## 2018-07-26 MED ORDER — NEBIVOLOL HCL 5 MG PO TABS: 5.0000 mg | ORAL_TABLET | Freq: Every day | ORAL | 6 refills | Status: DC

## 2018-07-26 MED ORDER — SPIRONOLACTONE 25 MG PO TABS: 25.0000 mg | ORAL_TABLET | Freq: Two times a day (BID) | ORAL | 6 refills | Status: DC

## 2018-07-26 MED ORDER — ENTRESTO 97-103 MG PO TABS: 1.0000 | ORAL_TABLET | Freq: Two times a day (BID) | ORAL | 6 refills | Status: DC

## 2018-07-26 NOTE — Patient Instructions (Signed)
Medication Instructions:  Your physician recommends that you continue on your current medications as directed. Please refer to the Current Medication list given to you today. If you need a refill on your cardiac medications before your next appointment, please call your pharmacy.   Lab work: Your physician recommends that you return for lab work in: TODAY-BMET If you have labs (blood work) drawn today and your tests are completely normal, you will receive your results only by: Marland Kitchen MyChart Message (if you have MyChart) OR . A paper copy in the mail If you have any lab test that is abnormal or we need to change your treatment, we will call you to review the results.  Testing/Procedures: NONE   Follow-Up: At [facility], you and your health needs are our priority.  As part of our continuing mission to provide you with exceptional heart care, we have created designated Provider Care Teams.  These Care Teams include your primary Cardiologist (physician) and Advanced Practice Providers (APPs -  Physician Assistants and Nurse Practitioners) who all work together to provide you with the care you need, when you need it.  Marland Kitchen [name] RECOMMENDS YOU FOLLOW UP AS SCHEDULED WITH DR [facility]  Any Other Special Instructions Will Be Listed Below (If Applicable).
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
## 2018-07-27 DIAGNOSIS — I63511 Cerebral infarction due to unspecified occlusion or stenosis of right middle cerebral artery: Secondary | ICD-10-CM | POA: Diagnosis not present

## 2018-07-27 LAB — BASIC METABOLIC PANEL
BUN/Creatinine Ratio: 12 (ref 9–20)
BUN: 10 mg/dL (ref 6–24)
CO2: 19 mmol/L — ABNORMAL LOW (ref 20–29)
Calcium: 9.4 mg/dL (ref 8.7–10.2)
Chloride: 107 mmol/L — ABNORMAL HIGH (ref 96–106)
Creatinine, Ser: 0.86 mg/dL (ref 0.76–1.27)
GFR calc Af Amer: 115 mL/min/{1.73_m2} (ref >59)
GFR calc non Af Amer: 100 mL/min/{1.73_m2} (ref >59)
Glucose: 69 mg/dL (ref 65–99)
Potassium: 3.8 mmol/L (ref 3.5–5.2)
Sodium: 140 mmol/L (ref 134–144)

## 2018-07-27 MED FILL — ENTRESTO 97 MG-103 MG TAB: 97-103 | 30 days supply | Qty: 60 | Fill #0

## 2018-07-27 MED FILL — BYSTOLIC 5 MG TABLET: 5 | 90 days supply | Qty: 90 | Fill #0

## 2018-07-27 MED FILL — FUROSEMIDE 40 MG TAB: 40 | 90 days supply | Qty: 90 | Fill #0

## 2018-07-27 MED FILL — SPIRONOLACTONE 25 MG TABLET: 25 | 90 days supply | Qty: 180 | Fill #0

## 2018-07-27 MED FILL — hydrALAZINE HCL 50 MG TABS: 50 | 90 days supply | Qty: 180 | Fill #0

## 2018-07-28 DIAGNOSIS — I63511 Cerebral infarction due to unspecified occlusion or stenosis of right middle cerebral artery: Secondary | ICD-10-CM | POA: Diagnosis not present

## 2018-07-29 DIAGNOSIS — I63511 Cerebral infarction due to unspecified occlusion or stenosis of right middle cerebral artery: Secondary | ICD-10-CM | POA: Diagnosis not present

## 2018-07-30 DIAGNOSIS — I63511 Cerebral infarction due to unspecified occlusion or stenosis of right middle cerebral artery: Secondary | ICD-10-CM | POA: Diagnosis not present

## 2018-08-02 DIAGNOSIS — I63511 Cerebral infarction due to unspecified occlusion or stenosis of right middle cerebral artery: Secondary | ICD-10-CM | POA: Diagnosis not present

## 2018-08-03 ENCOUNTER — Ambulatory Visit (INDEPENDENT_AMBULATORY_CARE_PROVIDER_SITE_OTHER): Payer: Medicaid Other | Admitting: *Deleted

## 2018-08-03 DIAGNOSIS — I428 Other cardiomyopathies: Secondary | ICD-10-CM | POA: Diagnosis not present

## 2018-08-03 DIAGNOSIS — I63511 Cerebral infarction due to unspecified occlusion or stenosis of right middle cerebral artery: Secondary | ICD-10-CM | POA: Diagnosis not present

## 2018-08-03 DIAGNOSIS — I5042 Chronic combined systolic (congestive) and diastolic (congestive) heart failure: Secondary | ICD-10-CM

## 2018-08-03 LAB — CUP PACEART REMOTE DEVICE CHECK
Date Time Interrogation Session: 20200714114914
Implantable Lead Implant Date: 20191230
Implantable Lead Implant Date: 20191230
Implantable Lead Location: 753860
Implantable Lead Location: 753860
Implantable Lead Model: 7122
Implantable Lead Model: 7122
Implantable Pulse Generator Implant Date: 20191230
Pulse Gen Serial Number: 9850980

## 2018-08-04 ENCOUNTER — Telehealth: Payer: Self-pay | Admitting: Cardiology

## 2018-08-04 DIAGNOSIS — I63511 Cerebral infarction due to unspecified occlusion or stenosis of right middle cerebral artery: Secondary | ICD-10-CM | POA: Diagnosis not present

## 2018-08-04 NOTE — Telephone Encounter (Signed)

## 2018-08-05 DIAGNOSIS — I63511 Cerebral infarction due to unspecified occlusion or stenosis of right middle cerebral artery: Secondary | ICD-10-CM | POA: Diagnosis not present

## 2018-08-06 ENCOUNTER — Ambulatory Visit (INDEPENDENT_AMBULATORY_CARE_PROVIDER_SITE_OTHER): Payer: Medicaid Other | Admitting: Cardiology

## 2018-08-06 ENCOUNTER — Encounter: Payer: Self-pay | Admitting: Cardiology

## 2018-08-06 ENCOUNTER — Other Ambulatory Visit: Payer: Self-pay

## 2018-08-06 VITALS — BP 160/100 | HR 71 | Ht 68.0 in | Wt 144.0 lb

## 2018-08-06 DIAGNOSIS — I428 Other cardiomyopathies: Secondary | ICD-10-CM

## 2018-08-06 DIAGNOSIS — Z95810 Presence of automatic (implantable) cardiac defibrillator: Secondary | ICD-10-CM | POA: Diagnosis not present

## 2018-08-06 DIAGNOSIS — I63511 Cerebral infarction due to unspecified occlusion or stenosis of right middle cerebral artery: Secondary | ICD-10-CM | POA: Diagnosis not present

## 2018-08-06 LAB — CUP PACEART INCLINIC DEVICE CHECK
Battery Remaining Longevity: 93 mo
Brady Statistic RV Percent Paced: 0 %
Date Time Interrogation Session: 20200717113018
HighPow Impedance: 66.375
Implantable Lead Implant Date: 20191230
Implantable Lead Implant Date: 20191230
Implantable Lead Location: 753860
Implantable Lead Location: 753860
Implantable Lead Model: 7122
Implantable Lead Model: 7122
Implantable Pulse Generator Implant Date: 20191230
Lead Channel Impedance Value: 587.5 Ohm
Lead Channel Pacing Threshold Amplitude: 1 V
Lead Channel Pacing Threshold Amplitude: 1 V
Lead Channel Pacing Threshold Pulse Width: 0.5 ms
Lead Channel Pacing Threshold Pulse Width: 0.5 ms
Lead Channel Sensing Intrinsic Amplitude: 11.6 mV
Lead Channel Setting Pacing Amplitude: 2.5 V
Lead Channel Setting Pacing Pulse Width: 0.5 ms
Lead Channel Setting Sensing Sensitivity: 0.5 mV
Pulse Gen Serial Number: 9850980

## 2018-08-06 MED ORDER — NEBIVOLOL HCL 10 MG PO TABS: 10.0000 mg | ORAL_TABLET | Freq: Every day | ORAL | 11 refills | Status: DC

## 2018-08-06 MED FILL — BYSTOLIC 10 MG TABLET: 10 | 30 days supply | Qty: 30 | Fill #0

## 2018-08-06 NOTE — Patient Instructions (Addendum)
Medication Instructions:  Your physician has recommended you make the following change in your medication:  INCREASE Bystolic to 10 mg daily  * If you need a refill on your cardiac medications before your next appointment, please call your pharmacy.   Labwork: None ordered  Testing/Procedures: None ordered  Follow-Up: Your physician wants you to follow-up in: 1 year with Dr. Curt Bears.  You will receive a reminder letter in the mail two months in advance. If you don't receive a letter, please call our office to schedule the follow-up appointment.  Thank you for choosing [facility]!!   Trinidad Curet, RN [phone]
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
## 2018-08-09 DIAGNOSIS — I63511 Cerebral infarction due to unspecified occlusion or stenosis of right middle cerebral artery: Secondary | ICD-10-CM | POA: Diagnosis not present

## 2018-08-10 DIAGNOSIS — I63511 Cerebral infarction due to unspecified occlusion or stenosis of right middle cerebral artery: Secondary | ICD-10-CM | POA: Diagnosis not present

## 2018-08-11 DIAGNOSIS — I63511 Cerebral infarction due to unspecified occlusion or stenosis of right middle cerebral artery: Secondary | ICD-10-CM | POA: Diagnosis not present

## 2018-08-12 DIAGNOSIS — I63511 Cerebral infarction due to unspecified occlusion or stenosis of right middle cerebral artery: Secondary | ICD-10-CM | POA: Diagnosis not present

## 2018-08-13 DIAGNOSIS — I63511 Cerebral infarction due to unspecified occlusion or stenosis of right middle cerebral artery: Secondary | ICD-10-CM | POA: Diagnosis not present

## 2018-08-16 DIAGNOSIS — I63511 Cerebral infarction due to unspecified occlusion or stenosis of right middle cerebral artery: Secondary | ICD-10-CM | POA: Diagnosis not present

## 2018-08-17 DIAGNOSIS — I63511 Cerebral infarction due to unspecified occlusion or stenosis of right middle cerebral artery: Secondary | ICD-10-CM | POA: Diagnosis not present

## 2018-08-17 NOTE — Progress Notes (Signed)
Remote ICD transmission.   

## 2018-08-18 DIAGNOSIS — I63511 Cerebral infarction due to unspecified occlusion or stenosis of right middle cerebral artery: Secondary | ICD-10-CM | POA: Diagnosis not present

## 2018-08-19 DIAGNOSIS — I63511 Cerebral infarction due to unspecified occlusion or stenosis of right middle cerebral artery: Secondary | ICD-10-CM | POA: Diagnosis not present

## 2018-08-20 DIAGNOSIS — I63511 Cerebral infarction due to unspecified occlusion or stenosis of right middle cerebral artery: Secondary | ICD-10-CM | POA: Diagnosis not present

## 2018-08-23 DIAGNOSIS — I63511 Cerebral infarction due to unspecified occlusion or stenosis of right middle cerebral artery: Secondary | ICD-10-CM | POA: Diagnosis not present

## 2018-08-24 DIAGNOSIS — I63511 Cerebral infarction due to unspecified occlusion or stenosis of right middle cerebral artery: Secondary | ICD-10-CM | POA: Diagnosis not present

## 2018-08-25 DIAGNOSIS — I63511 Cerebral infarction due to unspecified occlusion or stenosis of right middle cerebral artery: Secondary | ICD-10-CM | POA: Diagnosis not present

## 2018-08-26 DIAGNOSIS — I63511 Cerebral infarction due to unspecified occlusion or stenosis of right middle cerebral artery: Secondary | ICD-10-CM | POA: Diagnosis not present

## 2018-08-27 DIAGNOSIS — I63511 Cerebral infarction due to unspecified occlusion or stenosis of right middle cerebral artery: Secondary | ICD-10-CM | POA: Diagnosis not present

## 2018-08-30 DIAGNOSIS — I63511 Cerebral infarction due to unspecified occlusion or stenosis of right middle cerebral artery: Secondary | ICD-10-CM | POA: Diagnosis not present

## 2018-08-31 DIAGNOSIS — I63511 Cerebral infarction due to unspecified occlusion or stenosis of right middle cerebral artery: Secondary | ICD-10-CM | POA: Diagnosis not present

## 2018-09-01 DIAGNOSIS — I63511 Cerebral infarction due to unspecified occlusion or stenosis of right middle cerebral artery: Secondary | ICD-10-CM | POA: Diagnosis not present

## 2018-09-02 DIAGNOSIS — I63511 Cerebral infarction due to unspecified occlusion or stenosis of right middle cerebral artery: Secondary | ICD-10-CM | POA: Diagnosis not present

## 2018-09-03 DIAGNOSIS — I63511 Cerebral infarction due to unspecified occlusion or stenosis of right middle cerebral artery: Secondary | ICD-10-CM | POA: Diagnosis not present

## 2018-09-06 DIAGNOSIS — I63511 Cerebral infarction due to unspecified occlusion or stenosis of right middle cerebral artery: Secondary | ICD-10-CM | POA: Diagnosis not present

## 2018-09-07 DIAGNOSIS — I63511 Cerebral infarction due to unspecified occlusion or stenosis of right middle cerebral artery: Secondary | ICD-10-CM | POA: Diagnosis not present

## 2018-09-08 DIAGNOSIS — I63511 Cerebral infarction due to unspecified occlusion or stenosis of right middle cerebral artery: Secondary | ICD-10-CM | POA: Diagnosis not present

## 2018-09-09 DIAGNOSIS — I63511 Cerebral infarction due to unspecified occlusion or stenosis of right middle cerebral artery: Secondary | ICD-10-CM | POA: Diagnosis not present

## 2018-09-10 DIAGNOSIS — I63511 Cerebral infarction due to unspecified occlusion or stenosis of right middle cerebral artery: Secondary | ICD-10-CM | POA: Diagnosis not present

## 2018-09-13 DIAGNOSIS — I63511 Cerebral infarction due to unspecified occlusion or stenosis of right middle cerebral artery: Secondary | ICD-10-CM | POA: Diagnosis not present

## 2018-09-14 DIAGNOSIS — I63511 Cerebral infarction due to unspecified occlusion or stenosis of right middle cerebral artery: Secondary | ICD-10-CM | POA: Diagnosis not present

## 2018-09-15 DIAGNOSIS — I63511 Cerebral infarction due to unspecified occlusion or stenosis of right middle cerebral artery: Secondary | ICD-10-CM | POA: Diagnosis not present

## 2018-09-16 DIAGNOSIS — I63511 Cerebral infarction due to unspecified occlusion or stenosis of right middle cerebral artery: Secondary | ICD-10-CM | POA: Diagnosis not present

## 2018-09-17 DIAGNOSIS — I63511 Cerebral infarction due to unspecified occlusion or stenosis of right middle cerebral artery: Secondary | ICD-10-CM | POA: Diagnosis not present

## 2018-09-20 DIAGNOSIS — I63511 Cerebral infarction due to unspecified occlusion or stenosis of right middle cerebral artery: Secondary | ICD-10-CM | POA: Diagnosis not present

## 2018-09-21 DIAGNOSIS — I63511 Cerebral infarction due to unspecified occlusion or stenosis of right middle cerebral artery: Secondary | ICD-10-CM | POA: Diagnosis not present

## 2018-09-22 DIAGNOSIS — I63511 Cerebral infarction due to unspecified occlusion or stenosis of right middle cerebral artery: Secondary | ICD-10-CM | POA: Diagnosis not present

## 2018-09-23 DIAGNOSIS — I63511 Cerebral infarction due to unspecified occlusion or stenosis of right middle cerebral artery: Secondary | ICD-10-CM | POA: Diagnosis not present

## 2018-09-24 DIAGNOSIS — I63511 Cerebral infarction due to unspecified occlusion or stenosis of right middle cerebral artery: Secondary | ICD-10-CM | POA: Diagnosis not present

## 2018-09-27 DIAGNOSIS — I63511 Cerebral infarction due to unspecified occlusion or stenosis of right middle cerebral artery: Secondary | ICD-10-CM | POA: Diagnosis not present

## 2018-09-28 DIAGNOSIS — I63511 Cerebral infarction due to unspecified occlusion or stenosis of right middle cerebral artery: Secondary | ICD-10-CM | POA: Diagnosis not present

## 2018-09-29 DIAGNOSIS — I63511 Cerebral infarction due to unspecified occlusion or stenosis of right middle cerebral artery: Secondary | ICD-10-CM | POA: Diagnosis not present

## 2018-09-30 DIAGNOSIS — I63511 Cerebral infarction due to unspecified occlusion or stenosis of right middle cerebral artery: Secondary | ICD-10-CM | POA: Diagnosis not present

## 2018-10-01 DIAGNOSIS — I63511 Cerebral infarction due to unspecified occlusion or stenosis of right middle cerebral artery: Secondary | ICD-10-CM | POA: Diagnosis not present

## 2018-10-04 ENCOUNTER — Ambulatory Visit (INDEPENDENT_AMBULATORY_CARE_PROVIDER_SITE_OTHER): Payer: Medicaid Other | Admitting: Cardiovascular Disease

## 2018-10-04 ENCOUNTER — Other Ambulatory Visit: Payer: Self-pay

## 2018-10-04 ENCOUNTER — Encounter: Payer: Self-pay | Admitting: Cardiovascular Disease

## 2018-10-04 VITALS — BP 160/120 | HR 76 | Ht 68.0 in | Wt 145.6 lb

## 2018-10-04 DIAGNOSIS — Z5181 Encounter for therapeutic drug level monitoring: Secondary | ICD-10-CM

## 2018-10-04 DIAGNOSIS — I10 Essential (primary) hypertension: Secondary | ICD-10-CM

## 2018-10-04 DIAGNOSIS — I5042 Chronic combined systolic (congestive) and diastolic (congestive) heart failure: Secondary | ICD-10-CM | POA: Diagnosis not present

## 2018-10-04 DIAGNOSIS — I63511 Cerebral infarction due to unspecified occlusion or stenosis of right middle cerebral artery: Secondary | ICD-10-CM | POA: Diagnosis not present
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
## 2018-10-04 NOTE — Patient Instructions (Signed)
Medication Instructions:  TAKE YOUR MEDICATIONS NOW  If you need a refill on your cardiac medications before your next appointment, please call your pharmacy.   Lab work: FASTING LP/CMET SOON  If you have labs (blood work) drawn today and your tests are completely normal, you will receive your results only by: Marland Kitchen MyChart Message (if you have MyChart) OR . A paper copy in the mail If you have any lab test that is abnormal or we need to change your treatment, we will call you to review the results.  Testing/Procedures: NONE  Follow-Up: At [facility], you and your health needs are our priority.  As part of our continuing mission to provide you with exceptional heart care, we have created designated Provider Care Teams.  These Care Teams include your primary Cardiologist (physician) and Advanced Practice Providers (APPs -  Physician Assistants and Nurse Practitioners) who all work together to provide you with the care you need, when you need it. You will need a follow up appointment in 4 months.  Please call our office 2 months in advance to schedule this appointment.  You may see Skeet Latch, MD or one of the following Advanced Practice Providers on your designated Care Team:   Kerin Ransom, PA-C Roby Lofts, [place] . Sande Rives, PA-C  Your physician recommends that you schedule a follow-up appointment in: 1 MONTH WITH PHARM D FOR BLOOD PRESSURE  Any Other Special Instructions Will Be Listed Below (If Applicable). MONITOR AND LOG YOUR BLOOD PRESSURE DAILY. BRING LOG AND MACHINE TO YOUR FOLLOW UP IN 1 MONTH

## 2018-10-05 DIAGNOSIS — I63511 Cerebral infarction due to unspecified occlusion or stenosis of right middle cerebral artery: Secondary | ICD-10-CM | POA: Diagnosis not present

## 2018-10-05 LAB — LIPID PANEL
Chol/HDL Ratio: 2.1 ratio (ref 0.0–5.0)
Cholesterol, Total: 172 mg/dL (ref 100–199)
HDL: 81 mg/dL (ref >39)
LDL Chol Calc (NIH): 62 mg/dL (ref 0–99)
Triglycerides: 177 mg/dL — ABNORMAL HIGH (ref 0–149)
VLDL Cholesterol Cal: 29 mg/dL (ref 5–40)

## 2018-10-05 LAB — COMPREHENSIVE METABOLIC PANEL
ALT: 22 IU/L (ref 0–44)
AST: 28 IU/L (ref 0–40)
Albumin/Globulin Ratio: 2.4 — ABNORMAL HIGH (ref 1.2–2.2)
Albumin: 4.8 g/dL (ref 3.8–4.9)
Alkaline Phosphatase: 59 IU/L (ref 39–117)
BUN/Creatinine Ratio: 18 (ref 9–20)
BUN: 15 mg/dL (ref 6–24)
Bilirubin Total: 0.4 mg/dL (ref 0.0–1.2)
CO2: 22 mmol/L (ref 20–29)
Calcium: 9.7 mg/dL (ref 8.7–10.2)
Chloride: 104 mmol/L (ref 96–106)
Creatinine, Ser: 0.85 mg/dL (ref 0.76–1.27)
GFR calc Af Amer: 115 mL/min/{1.73_m2} (ref >59)
GFR calc non Af Amer: 99 mL/min/{1.73_m2} (ref >59)
Globulin, Total: 2 g/dL (ref 1.5–4.5)
Glucose: 63 mg/dL — ABNORMAL LOW (ref 65–99)
Potassium: 4.3 mmol/L (ref 3.5–5.2)
Sodium: 141 mmol/L (ref 134–144)
Total Protein: 6.8 g/dL (ref 6.0–8.5)

## 2018-10-06 DIAGNOSIS — I63511 Cerebral infarction due to unspecified occlusion or stenosis of right middle cerebral artery: Secondary | ICD-10-CM | POA: Diagnosis not present

## 2018-10-07 DIAGNOSIS — I63511 Cerebral infarction due to unspecified occlusion or stenosis of right middle cerebral artery: Secondary | ICD-10-CM | POA: Diagnosis not present

## 2018-10-08 DIAGNOSIS — I63511 Cerebral infarction due to unspecified occlusion or stenosis of right middle cerebral artery: Secondary | ICD-10-CM | POA: Diagnosis not present

## 2018-10-11 DIAGNOSIS — I63511 Cerebral infarction due to unspecified occlusion or stenosis of right middle cerebral artery: Secondary | ICD-10-CM | POA: Diagnosis not present

## 2018-10-12 DIAGNOSIS — I63511 Cerebral infarction due to unspecified occlusion or stenosis of right middle cerebral artery: Secondary | ICD-10-CM | POA: Diagnosis not present

## 2018-10-13 DIAGNOSIS — I63511 Cerebral infarction due to unspecified occlusion or stenosis of right middle cerebral artery: Secondary | ICD-10-CM | POA: Diagnosis not present

## 2018-10-14 DIAGNOSIS — I63511 Cerebral infarction due to unspecified occlusion or stenosis of right middle cerebral artery: Secondary | ICD-10-CM | POA: Diagnosis not present

## 2018-10-15 DIAGNOSIS — I63511 Cerebral infarction due to unspecified occlusion or stenosis of right middle cerebral artery: Secondary | ICD-10-CM | POA: Diagnosis not present

## 2018-10-17 ENCOUNTER — Encounter: Payer: Self-pay | Admitting: Cardiovascular Disease

## 2018-10-18 DIAGNOSIS — I63511 Cerebral infarction due to unspecified occlusion or stenosis of right middle cerebral artery: Secondary | ICD-10-CM | POA: Diagnosis not present

## 2018-10-19 DIAGNOSIS — I63511 Cerebral infarction due to unspecified occlusion or stenosis of right middle cerebral artery: Secondary | ICD-10-CM | POA: Diagnosis not present

## 2018-10-20 DIAGNOSIS — I63511 Cerebral infarction due to unspecified occlusion or stenosis of right middle cerebral artery: Secondary | ICD-10-CM | POA: Diagnosis not present

## 2018-10-21 DIAGNOSIS — I63511 Cerebral infarction due to unspecified occlusion or stenosis of right middle cerebral artery: Secondary | ICD-10-CM | POA: Diagnosis not present

## 2018-10-22 DIAGNOSIS — I63511 Cerebral infarction due to unspecified occlusion or stenosis of right middle cerebral artery: Secondary | ICD-10-CM | POA: Diagnosis not present

## 2018-10-25 DIAGNOSIS — I63511 Cerebral infarction due to unspecified occlusion or stenosis of right middle cerebral artery: Secondary | ICD-10-CM | POA: Diagnosis not present

## 2018-10-26 DIAGNOSIS — I63511 Cerebral infarction due to unspecified occlusion or stenosis of right middle cerebral artery: Secondary | ICD-10-CM | POA: Diagnosis not present

## 2018-10-27 DIAGNOSIS — I63511 Cerebral infarction due to unspecified occlusion or stenosis of right middle cerebral artery: Secondary | ICD-10-CM | POA: Diagnosis not present

## 2018-10-28 DIAGNOSIS — I63511 Cerebral infarction due to unspecified occlusion or stenosis of right middle cerebral artery: Secondary | ICD-10-CM | POA: Diagnosis not present

## 2018-10-29 DIAGNOSIS — I63511 Cerebral infarction due to unspecified occlusion or stenosis of right middle cerebral artery: Secondary | ICD-10-CM | POA: Diagnosis not present

## 2018-11-01 DIAGNOSIS — I63511 Cerebral infarction due to unspecified occlusion or stenosis of right middle cerebral artery: Secondary | ICD-10-CM | POA: Diagnosis not present

## 2018-11-02 DIAGNOSIS — I63511 Cerebral infarction due to unspecified occlusion or stenosis of right middle cerebral artery: Secondary | ICD-10-CM | POA: Diagnosis not present

## 2018-11-03 ENCOUNTER — Ambulatory Visit (INDEPENDENT_AMBULATORY_CARE_PROVIDER_SITE_OTHER): Payer: Medicaid Other | Admitting: *Deleted

## 2018-11-03 DIAGNOSIS — I63511 Cerebral infarction due to unspecified occlusion or stenosis of right middle cerebral artery: Secondary | ICD-10-CM | POA: Diagnosis not present

## 2018-11-03 DIAGNOSIS — I428 Other cardiomyopathies: Secondary | ICD-10-CM

## 2018-11-03 DIAGNOSIS — I5042 Chronic combined systolic (congestive) and diastolic (congestive) heart failure: Secondary | ICD-10-CM

## 2018-11-03 LAB — CUP PACEART REMOTE DEVICE CHECK
Battery Remaining Longevity: 93 mo
Battery Remaining Percentage: 91 %
Battery Voltage: 3.11 V
Brady Statistic RV Percent Paced: 0 %
Date Time Interrogation Session: 20201013060020
HighPow Impedance: 73 Ohm
HighPow Impedance: 73 Ohm
Implantable Lead Implant Date: 20191230
Implantable Lead Implant Date: 20191230
Implantable Lead Location: 753860
Implantable Lead Location: 753860
Implantable Lead Model: 7122
Implantable Lead Model: 7122
Implantable Pulse Generator Implant Date: 20191230
Lead Channel Impedance Value: 550 Ohm
Lead Channel Pacing Threshold Amplitude: 1 V
Lead Channel Pacing Threshold Pulse Width: 0.5 ms
Lead Channel Sensing Intrinsic Amplitude: 11.6 mV
Lead Channel Setting Pacing Amplitude: 2.5 V
Lead Channel Setting Pacing Pulse Width: 0.5 ms
Lead Channel Setting Sensing Sensitivity: 0.5 mV
Pulse Gen Serial Number: 9850980

## 2018-11-04 ENCOUNTER — Ambulatory Visit: Payer: Medicaid Other

## 2018-11-04 DIAGNOSIS — I63511 Cerebral infarction due to unspecified occlusion or stenosis of right middle cerebral artery: Secondary | ICD-10-CM | POA: Diagnosis not present

## 2018-11-05 DIAGNOSIS — I63511 Cerebral infarction due to unspecified occlusion or stenosis of right middle cerebral artery: Secondary | ICD-10-CM | POA: Diagnosis not present

## 2018-11-08 DIAGNOSIS — I63511 Cerebral infarction due to unspecified occlusion or stenosis of right middle cerebral artery: Secondary | ICD-10-CM | POA: Diagnosis not present

## 2018-11-09 DIAGNOSIS — I63511 Cerebral infarction due to unspecified occlusion or stenosis of right middle cerebral artery: Secondary | ICD-10-CM | POA: Diagnosis not present

## 2018-11-10 DIAGNOSIS — I63511 Cerebral infarction due to unspecified occlusion or stenosis of right middle cerebral artery: Secondary | ICD-10-CM | POA: Diagnosis not present

## 2018-11-11 DIAGNOSIS — I63511 Cerebral infarction due to unspecified occlusion or stenosis of right middle cerebral artery: Secondary | ICD-10-CM | POA: Diagnosis not present

## 2018-11-12 ENCOUNTER — Telehealth: Payer: Self-pay | Admitting: *Deleted

## 2018-11-12 DIAGNOSIS — I63511 Cerebral infarction due to unspecified occlusion or stenosis of right middle cerebral artery: Secondary | ICD-10-CM | POA: Diagnosis not present

## 2018-11-12 MED ORDER — CARVEDILOL 25 MG PO TABS: 25.0000 mg | ORAL_TABLET | Freq: Two times a day (BID) | ORAL | 3 refills | Status: DC

## 2018-11-12 MED FILL — CARVEDILOL 25 MG TABLET: 25 | 90 days supply | Qty: 180 | Fill #0

## 2018-11-12 NOTE — Telephone Encounter (Signed)
Pt has been notified of device check recommendations per Dr. Curt Bears to stop Bystolic and start Coreg 25 mg BID. Pt is agreeable to plan of care. New Rx has been sent into the [facility]. Sent Coreg 25 mg BID # 180 x 3. Pt thanked me for the call. Patient notified of result.  Please refer to phone note from today for complete details.   Julaine Hua, CMA 11/12/2018 1:24 PM

## 2018-11-12 NOTE — Telephone Encounter (Signed)
-----   Message from Will Meredith Leeds, MD sent at 11/04/2018 10:01 AM EDT ----- Abnormal device interrogation reviewed.  Lead parameters and battery status stable.  High burden of arrhythmia. Stop bystoilc and start coreg 25 mg bid.

## 2018-11-15 DIAGNOSIS — I63511 Cerebral infarction due to unspecified occlusion or stenosis of right middle cerebral artery: Secondary | ICD-10-CM | POA: Diagnosis not present

## 2018-11-15 NOTE — Progress Notes (Signed)
Remote ICD transmission.   

## 2018-11-16 DIAGNOSIS — I63511 Cerebral infarction due to unspecified occlusion or stenosis of right middle cerebral artery: Secondary | ICD-10-CM | POA: Diagnosis not present

## 2018-11-17 DIAGNOSIS — I63511 Cerebral infarction due to unspecified occlusion or stenosis of right middle cerebral artery: Secondary | ICD-10-CM | POA: Diagnosis not present

## 2018-11-18 DIAGNOSIS — I63511 Cerebral infarction due to unspecified occlusion or stenosis of right middle cerebral artery: Secondary | ICD-10-CM | POA: Diagnosis not present

## 2018-11-19 ENCOUNTER — Other Ambulatory Visit: Payer: Self-pay | Admitting: Family Medicine

## 2018-11-19 DIAGNOSIS — I63511 Cerebral infarction due to unspecified occlusion or stenosis of right middle cerebral artery: Secondary | ICD-10-CM | POA: Diagnosis not present

## 2018-11-19 DIAGNOSIS — R059 Cough, unspecified: Secondary | ICD-10-CM

## 2018-11-19 DIAGNOSIS — R05 Cough: Secondary | ICD-10-CM

## 2018-11-19 MED FILL — PROAIR HFA 90 MCG INHALER: 108 (90 BAS | 25 days supply | Qty: 9 | Fill #0

## 2018-11-22 DIAGNOSIS — I63511 Cerebral infarction due to unspecified occlusion or stenosis of right middle cerebral artery: Secondary | ICD-10-CM | POA: Diagnosis not present

## 2018-11-23 DIAGNOSIS — I63511 Cerebral infarction due to unspecified occlusion or stenosis of right middle cerebral artery: Secondary | ICD-10-CM | POA: Diagnosis not present

## 2018-11-24 DIAGNOSIS — I63511 Cerebral infarction due to unspecified occlusion or stenosis of right middle cerebral artery: Secondary | ICD-10-CM | POA: Diagnosis not present

## 2018-11-25 DIAGNOSIS — I63511 Cerebral infarction due to unspecified occlusion or stenosis of right middle cerebral artery: Secondary | ICD-10-CM | POA: Diagnosis not present

## 2018-11-26 DIAGNOSIS — I63511 Cerebral infarction due to unspecified occlusion or stenosis of right middle cerebral artery: Secondary | ICD-10-CM | POA: Diagnosis not present

## 2018-11-29 DIAGNOSIS — I63511 Cerebral infarction due to unspecified occlusion or stenosis of right middle cerebral artery: Secondary | ICD-10-CM | POA: Diagnosis not present

## 2018-11-30 DIAGNOSIS — I63511 Cerebral infarction due to unspecified occlusion or stenosis of right middle cerebral artery: Secondary | ICD-10-CM | POA: Diagnosis not present

## 2018-12-01 DIAGNOSIS — I63511 Cerebral infarction due to unspecified occlusion or stenosis of right middle cerebral artery: Secondary | ICD-10-CM | POA: Diagnosis not present

## 2018-12-02 DIAGNOSIS — I63511 Cerebral infarction due to unspecified occlusion or stenosis of right middle cerebral artery: Secondary | ICD-10-CM | POA: Diagnosis not present

## 2018-12-03 DIAGNOSIS — I63511 Cerebral infarction due to unspecified occlusion or stenosis of right middle cerebral artery: Secondary | ICD-10-CM | POA: Diagnosis not present

## 2018-12-06 DIAGNOSIS — I63511 Cerebral infarction due to unspecified occlusion or stenosis of right middle cerebral artery: Secondary | ICD-10-CM | POA: Diagnosis not present

## 2018-12-07 DIAGNOSIS — I63511 Cerebral infarction due to unspecified occlusion or stenosis of right middle cerebral artery: Secondary | ICD-10-CM | POA: Diagnosis not present

## 2018-12-08 DIAGNOSIS — I63511 Cerebral infarction due to unspecified occlusion or stenosis of right middle cerebral artery: Secondary | ICD-10-CM | POA: Diagnosis not present

## 2018-12-09 ENCOUNTER — Ambulatory Visit: Payer: Medicaid Other

## 2018-12-09 DIAGNOSIS — I63511 Cerebral infarction due to unspecified occlusion or stenosis of right middle cerebral artery: Secondary | ICD-10-CM | POA: Diagnosis not present

## 2018-12-09 NOTE — Progress Notes (Deleted)
12/09/2018 [name] 23-Mar-1965 [id number]   HPI:  [name] is a 53 y.o. male patient of Dr ***, with a PMH below who presents today for hypertension clinic evaluation.  Past Medical History:                   Blood Pressure Goal:  130/80  Current Medications: carvedilol 25 mg bid, hydralazine 50 mg bid, Entresto 97/103 mg bid, sprionolactone 25 mg qd   Family Hx:  Social Hx:  Diet:  Exercise:  Home BP readings:  Intolerances:   Labs:  Wt Readings from Last 3 Encounters:  10/04/18 145 lb 9.6 oz (66 kg)  08/06/18 144 lb (65.3 kg)  07/26/18 146 lb 3.2 oz (66.3 kg)   BP Readings from Last 3 Encounters:  10/04/18 (!) 160/120  08/06/18 (!) 160/100  07/26/18 (!) 160/93   Pulse Readings from Last 3 Encounters:  10/04/18 76  08/06/18 71  07/26/18 85    Current Outpatient Medications  Medication Sig Dispense Refill  . carvedilol (COREG) 25 MG tablet Take 1 tablet (25 mg total) by mouth 2 (two) times daily. 180 tablet 3  . cetirizine (ZYRTEC) 10 MG tablet Take 1 tablet (10 mg total) by mouth daily. 30 tablet 1  . cyclobenzaprine (FLEXERIL) 10 MG tablet Take 1 tablet (10 mg total) by mouth at bedtime. 30 tablet 1  . furosemide (LASIX) 40 MG tablet Take 1 tablet (40 mg total) by mouth daily. (Patient taking differently: Take 20 mg by mouth daily. ) 30 tablet 6  . hydrALAZINE (APRESOLINE) 50 MG tablet Take 1 tablet (50 mg total) by mouth 2 (two) times a day. 60 tablet 6  . magnesium oxide (MAG-OX) 400 MG tablet Take 1 tablet (400 mg total) by mouth daily. 30 tablet 5  . Multiple Vitamin (MULTIVITAMIN WITH MINERALS) TABS tablet Take 1 tablet by mouth daily.    Marland Kitchen PROAIR HFA 108 (90 Base) MCG/ACT inhaler INHALE 2 PUFFS INTO THE LUNGS EVERY 6 (SIX) HOURS AS NEEDED FOR WHEEZING OR SHORTNESS OF BREATH. 8.5 g 0  . sacubitril-valsartan (ENTRESTO) 97-103 MG Take 1 tablet by mouth 2 (two) times daily. 60 tablet 6  . spironolactone (ALDACTONE) 25 MG tablet  Take 1 tablet (25 mg total) by mouth 2 (two) times daily. (Patient taking differently: Take 25 mg by mouth daily. ) 60 tablet 6  . tiotropium (SPIRIVA HANDIHALER) 18 MCG inhalation capsule Place 1 capsule (18 mcg total) into inhaler and inhale daily. 30 capsule 6  . triamcinolone cream (KENALOG) 0.1 % Apply 1 application topically 2 (two) times daily. 80 g 1   No current facility-administered medications for this visit.     Allergies  Allergen Reactions  . Penicillins Other (See Comments)    Blisters  Has patient had a PCN reaction causing immediate rash, facial/tongue/throat swelling, SOB or lightheadedness with hypotension: Yes Has patient had a PCN reaction causing severe rash involving mucus membranes or skin necrosis: Yes Has patient had a PCN reaction that required hospitalization: No Has patient had a PCN reaction occurring within the last 10 years: No If all of the above answers are "NO", then may proceed with Cephalosporin use.     Past Medical History:  Diagnosis Date  . CHF (congestive heart failure) ([place])   . Headache   . Hypertension   . Stroke ([place]) 02/24/2015    There were no vitals taken for this visit.  No problem-specific Assessment & Plan notes found for  this encounter.   Phillips Hay PharmD CPP [facility] [address] [place], [place] [address] [phone]

## 2018-12-10 DIAGNOSIS — I63511 Cerebral infarction due to unspecified occlusion or stenosis of right middle cerebral artery: Secondary | ICD-10-CM | POA: Diagnosis not present

## 2018-12-13 DIAGNOSIS — I63511 Cerebral infarction due to unspecified occlusion or stenosis of right middle cerebral artery: Secondary | ICD-10-CM | POA: Diagnosis not present

## 2018-12-14 DIAGNOSIS — I63511 Cerebral infarction due to unspecified occlusion or stenosis of right middle cerebral artery: Secondary | ICD-10-CM | POA: Diagnosis not present

## 2018-12-15 DIAGNOSIS — I63511 Cerebral infarction due to unspecified occlusion or stenosis of right middle cerebral artery: Secondary | ICD-10-CM | POA: Diagnosis not present

## 2018-12-16 DIAGNOSIS — I63511 Cerebral infarction due to unspecified occlusion or stenosis of right middle cerebral artery: Secondary | ICD-10-CM | POA: Diagnosis not present

## 2018-12-17 DIAGNOSIS — I63511 Cerebral infarction due to unspecified occlusion or stenosis of right middle cerebral artery: Secondary | ICD-10-CM | POA: Diagnosis not present

## 2018-12-20 DIAGNOSIS — I63511 Cerebral infarction due to unspecified occlusion or stenosis of right middle cerebral artery: Secondary | ICD-10-CM | POA: Diagnosis not present

## 2018-12-21 DIAGNOSIS — I63511 Cerebral infarction due to unspecified occlusion or stenosis of right middle cerebral artery: Secondary | ICD-10-CM | POA: Diagnosis not present

## 2018-12-22 DIAGNOSIS — I63511 Cerebral infarction due to unspecified occlusion or stenosis of right middle cerebral artery: Secondary | ICD-10-CM | POA: Diagnosis not present

## 2018-12-23 DIAGNOSIS — I63511 Cerebral infarction due to unspecified occlusion or stenosis of right middle cerebral artery: Secondary | ICD-10-CM | POA: Diagnosis not present

## 2018-12-24 DIAGNOSIS — I63511 Cerebral infarction due to unspecified occlusion or stenosis of right middle cerebral artery: Secondary | ICD-10-CM | POA: Diagnosis not present

## 2018-12-27 DIAGNOSIS — I63511 Cerebral infarction due to unspecified occlusion or stenosis of right middle cerebral artery: Secondary | ICD-10-CM | POA: Diagnosis not present

## 2018-12-28 DIAGNOSIS — I63511 Cerebral infarction due to unspecified occlusion or stenosis of right middle cerebral artery: Secondary | ICD-10-CM | POA: Diagnosis not present

## 2018-12-29 DIAGNOSIS — I63511 Cerebral infarction due to unspecified occlusion or stenosis of right middle cerebral artery: Secondary | ICD-10-CM | POA: Diagnosis not present

## 2018-12-30 DIAGNOSIS — I63511 Cerebral infarction due to unspecified occlusion or stenosis of right middle cerebral artery: Secondary | ICD-10-CM | POA: Diagnosis not present

## 2018-12-31 DIAGNOSIS — I63511 Cerebral infarction due to unspecified occlusion or stenosis of right middle cerebral artery: Secondary | ICD-10-CM | POA: Diagnosis not present

## 2019-01-03 DIAGNOSIS — I63511 Cerebral infarction due to unspecified occlusion or stenosis of right middle cerebral artery: Secondary | ICD-10-CM | POA: Diagnosis not present

## 2019-01-04 DIAGNOSIS — I63511 Cerebral infarction due to unspecified occlusion or stenosis of right middle cerebral artery: Secondary | ICD-10-CM | POA: Diagnosis not present

## 2019-01-05 DIAGNOSIS — I63511 Cerebral infarction due to unspecified occlusion or stenosis of right middle cerebral artery: Secondary | ICD-10-CM | POA: Diagnosis not present

## 2019-01-06 DIAGNOSIS — I63511 Cerebral infarction due to unspecified occlusion or stenosis of right middle cerebral artery: Secondary | ICD-10-CM | POA: Diagnosis not present

## 2019-01-07 DIAGNOSIS — I63511 Cerebral infarction due to unspecified occlusion or stenosis of right middle cerebral artery: Secondary | ICD-10-CM | POA: Diagnosis not present

## 2019-01-10 DIAGNOSIS — I63511 Cerebral infarction due to unspecified occlusion or stenosis of right middle cerebral artery: Secondary | ICD-10-CM | POA: Diagnosis not present

## 2019-01-11 DIAGNOSIS — I63511 Cerebral infarction due to unspecified occlusion or stenosis of right middle cerebral artery: Secondary | ICD-10-CM | POA: Diagnosis not present

## 2019-01-12 DIAGNOSIS — I63511 Cerebral infarction due to unspecified occlusion or stenosis of right middle cerebral artery: Secondary | ICD-10-CM | POA: Diagnosis not present

## 2019-01-13 DIAGNOSIS — I63511 Cerebral infarction due to unspecified occlusion or stenosis of right middle cerebral artery: Secondary | ICD-10-CM | POA: Diagnosis not present

## 2019-01-14 DIAGNOSIS — I63511 Cerebral infarction due to unspecified occlusion or stenosis of right middle cerebral artery: Secondary | ICD-10-CM | POA: Diagnosis not present

## 2019-01-17 DIAGNOSIS — I63511 Cerebral infarction due to unspecified occlusion or stenosis of right middle cerebral artery: Secondary | ICD-10-CM | POA: Diagnosis not present

## 2019-01-18 DIAGNOSIS — I63511 Cerebral infarction due to unspecified occlusion or stenosis of right middle cerebral artery: Secondary | ICD-10-CM | POA: Diagnosis not present

## 2019-01-19 DIAGNOSIS — I63511 Cerebral infarction due to unspecified occlusion or stenosis of right middle cerebral artery: Secondary | ICD-10-CM | POA: Diagnosis not present

## 2019-01-20 DIAGNOSIS — I63511 Cerebral infarction due to unspecified occlusion or stenosis of right middle cerebral artery: Secondary | ICD-10-CM | POA: Diagnosis not present

## 2019-01-21 DIAGNOSIS — I63511 Cerebral infarction due to unspecified occlusion or stenosis of right middle cerebral artery: Secondary | ICD-10-CM | POA: Diagnosis not present

## 2019-01-24 DIAGNOSIS — I63511 Cerebral infarction due to unspecified occlusion or stenosis of right middle cerebral artery: Secondary | ICD-10-CM | POA: Diagnosis not present

## 2019-01-25 DIAGNOSIS — I63511 Cerebral infarction due to unspecified occlusion or stenosis of right middle cerebral artery: Secondary | ICD-10-CM | POA: Diagnosis not present

## 2019-01-26 DIAGNOSIS — I63511 Cerebral infarction due to unspecified occlusion or stenosis of right middle cerebral artery: Secondary | ICD-10-CM | POA: Diagnosis not present

## 2019-01-27 DIAGNOSIS — I63511 Cerebral infarction due to unspecified occlusion or stenosis of right middle cerebral artery: Secondary | ICD-10-CM | POA: Diagnosis not present

## 2019-01-28 DIAGNOSIS — I63511 Cerebral infarction due to unspecified occlusion or stenosis of right middle cerebral artery: Secondary | ICD-10-CM | POA: Diagnosis not present

## 2019-01-31 DIAGNOSIS — I63511 Cerebral infarction due to unspecified occlusion or stenosis of right middle cerebral artery: Secondary | ICD-10-CM | POA: Diagnosis not present

## 2019-02-01 DIAGNOSIS — I63511 Cerebral infarction due to unspecified occlusion or stenosis of right middle cerebral artery: Secondary | ICD-10-CM | POA: Diagnosis not present

## 2019-02-02 ENCOUNTER — Ambulatory Visit (INDEPENDENT_AMBULATORY_CARE_PROVIDER_SITE_OTHER): Payer: Medicaid Other | Admitting: *Deleted

## 2019-02-02 DIAGNOSIS — I428 Other cardiomyopathies: Secondary | ICD-10-CM

## 2019-02-02 DIAGNOSIS — I63511 Cerebral infarction due to unspecified occlusion or stenosis of right middle cerebral artery: Secondary | ICD-10-CM | POA: Diagnosis not present

## 2019-02-02 LAB — CUP PACEART REMOTE DEVICE CHECK
Battery Remaining Longevity: 89 mo
Battery Remaining Percentage: 88 %
Battery Voltage: 3.08 V
Brady Statistic RV Percent Paced: 0 %
Date Time Interrogation Session: 20210112020017
HighPow Impedance: 52 Ohm
HighPow Impedance: 52 Ohm
Implantable Lead Implant Date: 20191230
Implantable Lead Implant Date: 20191230
Implantable Lead Location: 753860
Implantable Lead Location: 753860
Implantable Lead Model: 7122
Implantable Lead Model: 7122
Implantable Pulse Generator Implant Date: 20191230
Lead Channel Impedance Value: 440 Ohm
Lead Channel Pacing Threshold Amplitude: 1 V
Lead Channel Pacing Threshold Pulse Width: 0.5 ms
Lead Channel Sensing Intrinsic Amplitude: 11.6 mV
Lead Channel Setting Pacing Amplitude: 2.5 V
Lead Channel Setting Pacing Pulse Width: 0.5 ms
Lead Channel Setting Sensing Sensitivity: 0.5 mV
Pulse Gen Serial Number: 9850980

## 2019-02-03 DIAGNOSIS — I63511 Cerebral infarction due to unspecified occlusion or stenosis of right middle cerebral artery: Secondary | ICD-10-CM | POA: Diagnosis not present

## 2019-02-04 DIAGNOSIS — I63511 Cerebral infarction due to unspecified occlusion or stenosis of right middle cerebral artery: Secondary | ICD-10-CM | POA: Diagnosis not present

## 2019-02-07 DIAGNOSIS — I63511 Cerebral infarction due to unspecified occlusion or stenosis of right middle cerebral artery: Secondary | ICD-10-CM | POA: Diagnosis not present

## 2019-02-08 DIAGNOSIS — I63511 Cerebral infarction due to unspecified occlusion or stenosis of right middle cerebral artery: Secondary | ICD-10-CM | POA: Diagnosis not present

## 2019-02-09 DIAGNOSIS — I63511 Cerebral infarction due to unspecified occlusion or stenosis of right middle cerebral artery: Secondary | ICD-10-CM | POA: Diagnosis not present

## 2019-02-10 DIAGNOSIS — I63511 Cerebral infarction due to unspecified occlusion or stenosis of right middle cerebral artery: Secondary | ICD-10-CM | POA: Diagnosis not present

## 2019-02-11 DIAGNOSIS — I63511 Cerebral infarction due to unspecified occlusion or stenosis of right middle cerebral artery: Secondary | ICD-10-CM | POA: Diagnosis not present

## 2019-02-14 DIAGNOSIS — I63511 Cerebral infarction due to unspecified occlusion or stenosis of right middle cerebral artery: Secondary | ICD-10-CM | POA: Diagnosis not present

## 2019-02-15 DIAGNOSIS — I63511 Cerebral infarction due to unspecified occlusion or stenosis of right middle cerebral artery: Secondary | ICD-10-CM | POA: Diagnosis not present

## 2019-02-16 DIAGNOSIS — I63511 Cerebral infarction due to unspecified occlusion or stenosis of right middle cerebral artery: Secondary | ICD-10-CM | POA: Diagnosis not present

## 2019-02-17 ENCOUNTER — Encounter: Payer: Self-pay | Admitting: Cardiovascular Disease

## 2019-02-17 ENCOUNTER — Telehealth (INDEPENDENT_AMBULATORY_CARE_PROVIDER_SITE_OTHER): Payer: Medicaid Other | Admitting: Cardiovascular Disease

## 2019-02-17 VITALS — Ht 68.0 in

## 2019-02-17 DIAGNOSIS — I5043 Acute on chronic combined systolic (congestive) and diastolic (congestive) heart failure: Secondary | ICD-10-CM

## 2019-02-17 DIAGNOSIS — Z720 Tobacco use: Secondary | ICD-10-CM | POA: Diagnosis not present

## 2019-02-17 DIAGNOSIS — G4733 Obstructive sleep apnea (adult) (pediatric): Secondary | ICD-10-CM

## 2019-02-17 DIAGNOSIS — I10 Essential (primary) hypertension: Secondary | ICD-10-CM

## 2019-02-17 MED ORDER — FUROSEMIDE 40 MG PO TABS: 40.0000 mg | ORAL_TABLET | Freq: Every day | ORAL | 5 refills | Status: DC

## 2019-02-17 MED ORDER — CARVEDILOL 25 MG PO TABS: ORAL_TABLET | ORAL | 5 refills | Status: DC

## 2019-02-17 MED FILL — CARVEDILOL 25 MG TABLET: 25 | 30 days supply | Qty: 90 | Fill #0
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
## 2019-02-17 NOTE — Patient Instructions (Addendum)
Medication Instructions:  TAKE FUROSEMIDE 40 MG DAILY THROUGH Saturday AND THEN Sunday START FUROSEMIDE 20 MG DAILY   INCREASE YOUR CARVEDILOL TO 25 MG 1 AND 1/2 TABLETS TWICE A DAY   IMPORTANT TO TAKE ALL MEDICATIONS AS PRESCRIBED   *If you need a refill on your cardiac medications before your next appointment, please call your pharmacy*  Lab Work: NONE  Testing/Procedures: NONE  Follow-Up: At [facility], you and your health needs are our priority.  As part of our continuing mission to provide you with exceptional heart care, we have created designated Provider Care Teams.  These Care Teams include your primary Cardiologist (physician) and Advanced Practice Providers (APPs -  Physician Assistants and Nurse Practitioners) who all work together to provide you with the care you need, when you need it.  Your next appointment:   06/24/2019 AT 1:20 IN OFFICE WITH DR [place]  WILL CALL YOU WITH BLOOD PRESSURE CLINIC VISIT   BRING YOUR BLOOD PRESSURE CUFF TO BLOOD PRESSURE CLINIC APPOINTMENT

## 2019-02-18 ENCOUNTER — Telehealth: Payer: Self-pay | Admitting: *Deleted

## 2019-02-18 NOTE — Telephone Encounter (Signed)
See telemedicine visit w/ [name] yesterday.   Will await next transmission readings.

## 2019-02-18 NOTE — Telephone Encounter (Signed)
Regan Lemming, MD  02/04/2019 10:29 AM EST    Abnormal device interrogation reviewed. Lead parameters and battery status stable. Multiple tachy episodes noted. Increase coreg to 37.5 mg BID.

## 2019-02-21 DIAGNOSIS — I63511 Cerebral infarction due to unspecified occlusion or stenosis of right middle cerebral artery: Secondary | ICD-10-CM | POA: Diagnosis not present

## 2019-02-22 DIAGNOSIS — I63511 Cerebral infarction due to unspecified occlusion or stenosis of right middle cerebral artery: Secondary | ICD-10-CM | POA: Diagnosis not present

## 2019-02-23 DIAGNOSIS — I63511 Cerebral infarction due to unspecified occlusion or stenosis of right middle cerebral artery: Secondary | ICD-10-CM | POA: Diagnosis not present

## 2019-02-24 DIAGNOSIS — I63511 Cerebral infarction due to unspecified occlusion or stenosis of right middle cerebral artery: Secondary | ICD-10-CM | POA: Diagnosis not present

## 2019-02-25 DIAGNOSIS — I63511 Cerebral infarction due to unspecified occlusion or stenosis of right middle cerebral artery: Secondary | ICD-10-CM | POA: Diagnosis not present

## 2019-02-28 DIAGNOSIS — I63511 Cerebral infarction due to unspecified occlusion or stenosis of right middle cerebral artery: Secondary | ICD-10-CM | POA: Diagnosis not present

## 2019-03-01 DIAGNOSIS — I63511 Cerebral infarction due to unspecified occlusion or stenosis of right middle cerebral artery: Secondary | ICD-10-CM | POA: Diagnosis not present

## 2019-03-02 DIAGNOSIS — I63511 Cerebral infarction due to unspecified occlusion or stenosis of right middle cerebral artery: Secondary | ICD-10-CM | POA: Diagnosis not present

## 2019-03-03 DIAGNOSIS — I63511 Cerebral infarction due to unspecified occlusion or stenosis of right middle cerebral artery: Secondary | ICD-10-CM | POA: Diagnosis not present

## 2019-03-04 DIAGNOSIS — I63511 Cerebral infarction due to unspecified occlusion or stenosis of right middle cerebral artery: Secondary | ICD-10-CM | POA: Diagnosis not present

## 2019-03-07 DIAGNOSIS — I63511 Cerebral infarction due to unspecified occlusion or stenosis of right middle cerebral artery: Secondary | ICD-10-CM | POA: Diagnosis not present

## 2019-03-08 DIAGNOSIS — I63511 Cerebral infarction due to unspecified occlusion or stenosis of right middle cerebral artery: Secondary | ICD-10-CM | POA: Diagnosis not present

## 2019-03-09 DIAGNOSIS — I63511 Cerebral infarction due to unspecified occlusion or stenosis of right middle cerebral artery: Secondary | ICD-10-CM | POA: Diagnosis not present

## 2019-03-11 DIAGNOSIS — I63511 Cerebral infarction due to unspecified occlusion or stenosis of right middle cerebral artery: Secondary | ICD-10-CM | POA: Diagnosis not present

## 2019-03-14 DIAGNOSIS — I63511 Cerebral infarction due to unspecified occlusion or stenosis of right middle cerebral artery: Secondary | ICD-10-CM | POA: Diagnosis not present

## 2019-03-15 DIAGNOSIS — I63511 Cerebral infarction due to unspecified occlusion or stenosis of right middle cerebral artery: Secondary | ICD-10-CM | POA: Diagnosis not present

## 2019-03-16 DIAGNOSIS — I63511 Cerebral infarction due to unspecified occlusion or stenosis of right middle cerebral artery: Secondary | ICD-10-CM | POA: Diagnosis not present

## 2019-03-18 DIAGNOSIS — I63511 Cerebral infarction due to unspecified occlusion or stenosis of right middle cerebral artery: Secondary | ICD-10-CM | POA: Diagnosis not present

## 2019-03-21 DIAGNOSIS — I63511 Cerebral infarction due to unspecified occlusion or stenosis of right middle cerebral artery: Secondary | ICD-10-CM | POA: Diagnosis not present

## 2019-03-22 DIAGNOSIS — I63511 Cerebral infarction due to unspecified occlusion or stenosis of right middle cerebral artery: Secondary | ICD-10-CM | POA: Diagnosis not present

## 2019-03-23 DIAGNOSIS — I63511 Cerebral infarction due to unspecified occlusion or stenosis of right middle cerebral artery: Secondary | ICD-10-CM | POA: Diagnosis not present

## 2019-03-24 DIAGNOSIS — I63511 Cerebral infarction due to unspecified occlusion or stenosis of right middle cerebral artery: Secondary | ICD-10-CM | POA: Diagnosis not present

## 2019-03-25 DIAGNOSIS — I63511 Cerebral infarction due to unspecified occlusion or stenosis of right middle cerebral artery: Secondary | ICD-10-CM | POA: Diagnosis not present

## 2019-03-28 DIAGNOSIS — I63511 Cerebral infarction due to unspecified occlusion or stenosis of right middle cerebral artery: Secondary | ICD-10-CM | POA: Diagnosis not present

## 2019-03-29 DIAGNOSIS — I63511 Cerebral infarction due to unspecified occlusion or stenosis of right middle cerebral artery: Secondary | ICD-10-CM | POA: Diagnosis not present

## 2019-03-30 DIAGNOSIS — I63511 Cerebral infarction due to unspecified occlusion or stenosis of right middle cerebral artery: Secondary | ICD-10-CM | POA: Diagnosis not present

## 2019-03-31 ENCOUNTER — Encounter: Payer: Self-pay | Admitting: Family Medicine

## 2019-03-31 ENCOUNTER — Ambulatory Visit: Payer: Medicaid Other | Attending: Family Medicine | Admitting: Family Medicine

## 2019-03-31 ENCOUNTER — Other Ambulatory Visit: Payer: Self-pay

## 2019-03-31 DIAGNOSIS — I5022 Chronic systolic (congestive) heart failure: Secondary | ICD-10-CM

## 2019-03-31 DIAGNOSIS — R252 Cramp and spasm: Secondary | ICD-10-CM | POA: Diagnosis not present

## 2019-03-31 DIAGNOSIS — I110 Hypertensive heart disease with heart failure: Secondary | ICD-10-CM | POA: Diagnosis not present

## 2019-03-31 DIAGNOSIS — I63511 Cerebral infarction due to unspecified occlusion or stenosis of right middle cerebral artery: Secondary | ICD-10-CM | POA: Diagnosis not present

## 2019-03-31 DIAGNOSIS — K2901 Acute gastritis with bleeding: Secondary | ICD-10-CM

## 2019-03-31 MED ORDER — CYCLOBENZAPRINE HCL 10 MG PO TABS: 10.0000 mg | ORAL_TABLET | Freq: Every day | ORAL | 1 refills | Status: DC

## 2019-03-31 MED FILL — CYCLOBENZAPRINE 10 MG TAB: 10 | 30 days supply | Qty: 30 | Fill #0

## 2019-03-31 NOTE — Progress Notes (Signed)
Virtual Visit via Telephone Note  I connected with [name], on 03/31/2019 at 8:56 AM by telephone due to the COVID-19 pandemic and verified that I am speaking with the correct person using two identifiers.   Consent: I discussed the limitations, risks, security and privacy concerns of performing an evaluation and management service by telephone and the availability of in person appointments. I also discussed with the patient that there may be a patient responsible charge related to this service. The patient expressed understanding and agreed to proceed.   Location of Patient: Out of town.  Location of Provider: Clinic   Persons participating in Telemedicine visit: Marvelous Bouwens Farrington-CMA Dr. 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 He has dyspnea at night and has to sit up or has to go out to get fresh air. Lasix was increased by Cardiology to 40mg  for some days then reduced back to 20mg . Denies pedal edema. He sleeps on 2 pillows, denies chest pain He does not have a scale to check his weight Leg cramps occur at night and he has to wake up to stretch his muscles; v8 juice provides some relief. Out of town and comes back into town next week hence he is unable to come in to be evaluated to have blood work done.  C/o of throwing up yellow vomit with a lot of blood in it; also has lower abdominal pain x1 week. He throws up anything he tries to eat. He drinks 2-3 beers daily. In the last 2 days he has had improvement with no vomiting or abdominal pain. He has not had a fever and denies upper  respiratory symptoms.     Past Medical History:  Diagnosis Date  . CHF (congestive heart failure) ([place])   . Headache   . Hypertension   . Stroke ([place]) 02/24/2015   Allergies  Allergen Reactions  . Penicillins Other (See Comments)    Blisters  Has patient had a PCN reaction causing immediate rash, facial/tongue/throat swelling, SOB or lightheadedness with hypotension: Yes Has patient had a PCN reaction causing severe rash involving mucus membranes or skin necrosis: Yes Has patient had a PCN reaction that required hospitalization: No Has patient had a PCN reaction occurring within the last 10 years: No If all of the above answers are "NO", then may proceed with Cephalosporin use.     Current Outpatient Medications on File Prior to Visit  Medication Sig Dispense Refill  . carvedilol (COREG) 25 MG tablet Take 1 and 1/2 tablets twice a day 90 tablet 5  . cetirizine (ZYRTEC) 10 MG tablet Take 1 tablet (10 mg total) by mouth daily. 30 tablet 1  . cyclobenzaprine (FLEXERIL) 10 MG tablet Take 1 tablet (10 mg total) by mouth at bedtime. 30 tablet 1  . furosemide (LASIX) 40 MG tablet Take 40 mg by mouth as directed. 1/2 tablet daily    . magnesium oxide (MAG-OX) 400 MG tablet Take 1 tablet (400 mg total) by mouth daily. 30 tablet 5  . Multiple Vitamin (MULTIVITAMIN WITH MINERALS) TABS tablet Take 1 tablet by mouth daily.    Marland Kitchen PROAIR HFA 108 (90 Base)  MCG/ACT inhaler INHALE 2 PUFFS INTO THE LUNGS EVERY 6 (SIX) HOURS AS NEEDED FOR WHEEZING OR SHORTNESS OF BREATH. 8.5 g 0  . sacubitril-valsartan (ENTRESTO) 97-103 MG Take 1 tablet by mouth 2 (two) times daily. 60 tablet 6  . spironolactone (ALDACTONE) 25 MG tablet Take 25 mg by mouth daily.    Marland Kitchen tiotropium (SPIRIVA HANDIHALER) 18 MCG inhalation capsule Place 1 capsule (18 mcg total) into inhaler and inhale daily. 30 capsule 6  . triamcinolone cream (KENALOG) 0.1 % Apply 1 application topically 2 (two) times daily. 80 g 1  . hydrALAZINE  (APRESOLINE) 50 MG tablet Take 1 tablet (50 mg total) by mouth 2 (two) times a day. 60 tablet 6   No current facility-administered medications on file prior to visit.    Observations/Objective: Awake, alert, oriented x3 Not in acute distress  Assessment and Plan: 1. Hypertensive heart disease with chronic systolic congestive heart failure (HCC) Uncontrolled with EF of 20 to 25% Cardiology notes titration of Lasix dose limited by soft blood pressure He sounds to be in some form of fluid overload and unfortunately is out of town hence he cannot be evaluated in person He has been advised to check his weights, blood pressure, get in touch with his cardiologist for an appointment and if symptoms persist he would need to go into the emergency room. Continue fluid restriction, low-sodium diet - Brain natriuretic peptide; Future  2. Muscle cramp We will need to check his potassium level in the light of chronic diuretic use Placed on Flexeril - Basic Metabolic Panel; Future - cyclobenzaprine (FLEXERIL) 10 MG tablet; Take 1 tablet (10 mg total) by mouth at bedtime.  Dispense: 30 tablet; Refill: 1  3. Other acute gastritis with hemorrhage Could be acute gastritis versus alcoholic gastritis Symptoms have improved Cannot exclude viral gastroenteritis Advised to stay hydrated We will check CBC when he is in town - CBC with Differential/Platelet; Future   Follow Up Instructions: Return in about 3 months (around 07/01/2019) for medical conditions.    I discussed the assessment and treatment plan with the patient. The patient was provided an opportunity to ask questions and all were answered. The patient agreed with the plan and demonstrated an understanding of the instructions.   The patient was advised to call back or seek an in-person evaluation if the symptoms worsen or if the condition fails to improve as anticipated.     I provided 17 minutes total of non-face-to-face time during this  encounter including median intraservice time, reviewing previous notes, investigations, ordering medications, medical decision making, coordinating care and patient verbalized understanding at the end of the visit.     Hoy Register, MD, FAAFP. [facility] [place], [place] [phone]   03/31/2019, 8:56 AM

## 2019-03-31 NOTE — Progress Notes (Signed)
Patient has been called and DOB has been verified. Patient has been screened and transferred to PCP to start phone visit.  States that he is still having trouble breathing, states that he is having cramps in his legs.  States that he has been throwing up blood.

## 2019-04-01 ENCOUNTER — Other Ambulatory Visit: Payer: Medicaid Other

## 2019-04-01 DIAGNOSIS — I63511 Cerebral infarction due to unspecified occlusion or stenosis of right middle cerebral artery: Secondary | ICD-10-CM | POA: Diagnosis not present

## 2019-04-04 DIAGNOSIS — I63511 Cerebral infarction due to unspecified occlusion or stenosis of right middle cerebral artery: Secondary | ICD-10-CM | POA: Diagnosis not present

## 2019-04-25 ENCOUNTER — Telehealth: Payer: Self-pay

## 2019-04-25 NOTE — Telephone Encounter (Signed)
Patient states that he has been throwing up blood. Patient requested an appointment to day but was informed that PCP is out of office.   Patient was informed to go to the ED.

## 2019-05-16 ENCOUNTER — Ambulatory Visit (INDEPENDENT_AMBULATORY_CARE_PROVIDER_SITE_OTHER): Payer: Medicaid Other | Admitting: *Deleted

## 2019-05-16 DIAGNOSIS — I428 Other cardiomyopathies: Secondary | ICD-10-CM

## 2019-05-17 LAB — CUP PACEART REMOTE DEVICE CHECK
Battery Remaining Longevity: 88 mo
Battery Remaining Percentage: 87 %
Battery Voltage: 3.04 V
Brady Statistic RV Percent Paced: 1 %
Date Time Interrogation Session: 20210427093701
HighPow Impedance: 50 Ohm
HighPow Impedance: 50 Ohm
Implantable Lead Implant Date: 20191230
Implantable Lead Implant Date: 20191230
Implantable Lead Location: 753860
Implantable Lead Location: 753860
Implantable Lead Model: 7122
Implantable Lead Model: 7122
Implantable Pulse Generator Implant Date: 20191230
Lead Channel Impedance Value: 480 Ohm
Lead Channel Pacing Threshold Amplitude: 1 V
Lead Channel Pacing Threshold Pulse Width: 0.5 ms
Lead Channel Sensing Intrinsic Amplitude: 11.6 mV
Lead Channel Setting Pacing Amplitude: 2.5 V
Lead Channel Setting Pacing Pulse Width: 0.5 ms
Lead Channel Setting Sensing Sensitivity: 0.5 mV
Pulse Gen Serial Number: 9850980

## 2019-05-17 NOTE — Progress Notes (Signed)
ICD Remote  

## 2019-05-30 ENCOUNTER — Telehealth: Payer: Self-pay | Admitting: *Deleted

## 2019-05-30 MED ORDER — CARVEDILOL 25 MG PO TABS: 50.0000 mg | ORAL_TABLET | Freq: Two times a day (BID) | ORAL | 1 refills | Status: DC

## 2019-05-30 NOTE — Telephone Encounter (Signed)
Baird Lyons, RN  05/24/2019 5:56 PM EDT    lmtcb

## 2019-05-30 NOTE — Telephone Encounter (Signed)
-----   Message from Will Jorja Loa, MD sent at 05/18/2019 12:34 PM EDT ----- Abnormal device interrogation reviewed.  Lead parameters and battery status stable.  High burden of SVT. Increase coreg to 50 mg BID.

## 2019-05-30 NOTE — Telephone Encounter (Signed)
Informed patient of results and verbal understanding expressed. Discussed increase instructions w/ pt. He asked that I call his mom to have her write down, as he is not able to at the moment.  Attempted mom, per pt request, left message. Advised to call if issues began after med increase. Rx sent to pharmacy.  Will attempt mom/pt at later time/date to ensure instructions understood and pt compliant

## 2019-05-31 NOTE — Telephone Encounter (Signed)
Spoke to pt's mother, informed of recommendation.  She will make sure to forward information along to pt per his request. Advised to call office with any questions and/or issues/SE after increase.

## 2019-06-01 ENCOUNTER — Emergency Department (HOSPITAL_COMMUNITY): Payer: Medicaid Other

## 2019-06-01 ENCOUNTER — Encounter (HOSPITAL_COMMUNITY): Payer: Self-pay | Admitting: Emergency Medicine

## 2019-06-01 ENCOUNTER — Inpatient Hospital Stay (HOSPITAL_COMMUNITY)
Admission: EM | Admit: 2019-06-01 | Discharge: 2019-06-03 | DRG: 287 | Disposition: A | Discharge status: Home / Self Care | Payer: Medicaid Other | Attending: Internal Medicine | Admitting: Internal Medicine

## 2019-06-01 ENCOUNTER — Other Ambulatory Visit: Payer: Self-pay

## 2019-06-01 DIAGNOSIS — I10 Essential (primary) hypertension: Secondary | ICD-10-CM | POA: Diagnosis not present

## 2019-06-01 DIAGNOSIS — Z9119 Patient's noncompliance with other medical treatment and regimen: Secondary | ICD-10-CM | POA: Diagnosis not present

## 2019-06-01 DIAGNOSIS — I5020 Unspecified systolic (congestive) heart failure: Secondary | ICD-10-CM | POA: Diagnosis not present

## 2019-06-01 DIAGNOSIS — I69349 Monoplegia of lower limb following cerebral infarction affecting unspecified side: Secondary | ICD-10-CM

## 2019-06-01 DIAGNOSIS — I952 Hypotension due to drugs: Secondary | ICD-10-CM | POA: Diagnosis not present

## 2019-06-01 DIAGNOSIS — R000 Tachycardia, unspecified: Secondary | ICD-10-CM | POA: Diagnosis not present

## 2019-06-01 DIAGNOSIS — N319 Neuromuscular dysfunction of bladder, unspecified: Secondary | ICD-10-CM | POA: Diagnosis present

## 2019-06-01 DIAGNOSIS — F1910 Other psychoactive substance abuse, uncomplicated: Secondary | ICD-10-CM | POA: Diagnosis not present

## 2019-06-01 DIAGNOSIS — I499 Cardiac arrhythmia, unspecified: Secondary | ICD-10-CM | POA: Diagnosis not present

## 2019-06-01 DIAGNOSIS — Z880 Allergy status to penicillin: Secondary | ICD-10-CM

## 2019-06-01 DIAGNOSIS — N289 Disorder of kidney and ureter, unspecified: Secondary | ICD-10-CM | POA: Diagnosis not present

## 2019-06-01 DIAGNOSIS — K920 Hematemesis: Secondary | ICD-10-CM

## 2019-06-01 DIAGNOSIS — I69322 Dysarthria following cerebral infarction: Secondary | ICD-10-CM

## 2019-06-01 DIAGNOSIS — Z95810 Presence of automatic (implantable) cardiac defibrillator: Secondary | ICD-10-CM

## 2019-06-01 DIAGNOSIS — I110 Hypertensive heart disease with heart failure: Principal | ICD-10-CM | POA: Diagnosis present

## 2019-06-01 DIAGNOSIS — Z20822 Contact with and (suspected) exposure to covid-19: Secondary | ICD-10-CM | POA: Diagnosis present

## 2019-06-01 DIAGNOSIS — R0602 Shortness of breath: Secondary | ICD-10-CM | POA: Diagnosis not present

## 2019-06-01 DIAGNOSIS — J449 Chronic obstructive pulmonary disease, unspecified: Secondary | ICD-10-CM | POA: Diagnosis present

## 2019-06-01 DIAGNOSIS — F17210 Nicotine dependence, cigarettes, uncomplicated: Secondary | ICD-10-CM | POA: Diagnosis present

## 2019-06-01 DIAGNOSIS — G4733 Obstructive sleep apnea (adult) (pediatric): Secondary | ICD-10-CM | POA: Diagnosis present

## 2019-06-01 DIAGNOSIS — I428 Other cardiomyopathies: Secondary | ICD-10-CM

## 2019-06-01 DIAGNOSIS — I5043 Acute on chronic combined systolic (congestive) and diastolic (congestive) heart failure: Secondary | ICD-10-CM | POA: Diagnosis not present

## 2019-06-01 DIAGNOSIS — Z83438 Family history of other disorder of lipoprotein metabolism and other lipidemia: Secondary | ICD-10-CM

## 2019-06-01 DIAGNOSIS — Z8249 Family history of ischemic heart disease and other diseases of the circulatory system: Secondary | ICD-10-CM

## 2019-06-01 DIAGNOSIS — R1010 Upper abdominal pain, unspecified: Secondary | ICD-10-CM | POA: Diagnosis not present

## 2019-06-01 DIAGNOSIS — Z823 Family history of stroke: Secondary | ICD-10-CM

## 2019-06-01 DIAGNOSIS — F1010 Alcohol abuse, uncomplicated: Secondary | ICD-10-CM | POA: Diagnosis present

## 2019-06-01 DIAGNOSIS — I509 Heart failure, unspecified: Secondary | ICD-10-CM

## 2019-06-01 DIAGNOSIS — M47816 Spondylosis without myelopathy or radiculopathy, lumbar region: Secondary | ICD-10-CM | POA: Diagnosis present

## 2019-06-01 DIAGNOSIS — R079 Chest pain, unspecified: Secondary | ICD-10-CM | POA: Diagnosis not present

## 2019-06-01 DIAGNOSIS — R0902 Hypoxemia: Secondary | ICD-10-CM | POA: Diagnosis not present

## 2019-06-01 DIAGNOSIS — N179 Acute kidney failure, unspecified: Secondary | ICD-10-CM | POA: Diagnosis present

## 2019-06-01 LAB — COMPREHENSIVE METABOLIC PANEL
ALT: 24 U/L (ref 0–44)
AST: 44 U/L — ABNORMAL HIGH (ref 15–41)
Albumin: 4.1 g/dL (ref 3.5–5.0)
Alkaline Phosphatase: 61 U/L (ref 38–126)
Anion gap: 17 — ABNORMAL HIGH (ref 5–15)
BUN: 18 mg/dL (ref 6–20)
CO2: 25 mmol/L (ref 22–32)
Calcium: 10.1 mg/dL (ref 8.9–10.3)
Chloride: 95 mmol/L — ABNORMAL LOW (ref 98–111)
Creatinine, Ser: 1.61 mg/dL — ABNORMAL HIGH (ref 0.61–1.24)
GFR calc Af Amer: 56 mL/min — ABNORMAL LOW (ref >60)
GFR calc non Af Amer: 48 mL/min — ABNORMAL LOW (ref >60)
Glucose, Bld: 91 mg/dL (ref 70–99)
Potassium: 3.7 mmol/L (ref 3.5–5.1)
Sodium: 137 mmol/L (ref 135–145)
Total Bilirubin: 1.8 mg/dL — ABNORMAL HIGH (ref 0.3–1.2)
Total Protein: 7.3 g/dL (ref 6.5–8.1)

## 2019-06-01 LAB — CBC WITH DIFFERENTIAL/PLATELET
Abs Immature Granulocytes: 0.03 10*3/uL (ref 0.00–0.07)
Basophils Absolute: 0 10*3/uL (ref 0.0–0.1)
Basophils Relative: 1 %
Eosinophils Absolute: 0 10*3/uL (ref 0.0–0.5)
Eosinophils Relative: 0 %
HCT: 45.4 % (ref 39.0–52.0)
Hemoglobin: 15.7 g/dL (ref 13.0–17.0)
Immature Granulocytes: 0 %
Lymphocytes Relative: 11 %
Lymphs Abs: 0.9 10*3/uL (ref 0.7–4.0)
MCH: 35 pg — ABNORMAL HIGH (ref 26.0–34.0)
MCHC: 34.6 g/dL (ref 30.0–36.0)
MCV: 101.3 fL — ABNORMAL HIGH (ref 80.0–100.0)
Monocytes Absolute: 0.7 10*3/uL (ref 0.1–1.0)
Monocytes Relative: 9 %
Neutro Abs: 6.4 10*3/uL (ref 1.7–7.7)
Neutrophils Relative %: 79 %
Platelets: 196 10*3/uL (ref 150–400)
RBC: 4.48 MIL/uL (ref 4.22–5.81)
RDW: 12.7 % (ref 11.5–15.5)
WBC: 8.2 10*3/uL (ref 4.0–10.5)
nRBC: 0 % (ref 0.0–0.2)

## 2019-06-01 LAB — TROPONIN I (HIGH SENSITIVITY)
Troponin I (High Sensitivity): 30 ng/L — ABNORMAL HIGH (ref <18)
Troponin I (High Sensitivity): 30 ng/L — ABNORMAL HIGH (ref <18)

## 2019-06-01 LAB — D-DIMER, QUANTITATIVE: D-Dimer, Quant: 0.42 ug/mL-FEU (ref 0.00–0.50)

## 2019-06-01 LAB — RAPID URINE DRUG SCREEN, HOSP PERFORMED
Amphetamines: NOT DETECTED
Barbiturates: NOT DETECTED
Benzodiazepines: NOT DETECTED
Cocaine: NOT DETECTED
Opiates: NOT DETECTED
Tetrahydrocannabinol: POSITIVE — AB

## 2019-06-01 LAB — BRAIN NATRIURETIC PEPTIDE: B Natriuretic Peptide: 1013 pg/mL — ABNORMAL HIGH (ref 0.0–100.0)

## 2019-06-01 LAB — LACTIC ACID, PLASMA: Lactic Acid, Venous: 1.9 mmol/L (ref 0.5–1.9)

## 2019-06-01 LAB — HIV ANTIBODY (ROUTINE TESTING W REFLEX): HIV Screen 4th Generation wRfx: NONREACTIVE

## 2019-06-01 LAB — SARS CORONAVIRUS 2 BY RT PCR (HOSPITAL ORDER, PERFORMED IN ~~LOC~~ HOSPITAL LAB): SARS Coronavirus 2: NEGATIVE

## 2019-06-01 MED ORDER — THIAMINE HCL 100 MG PO TABS
100.0000 mg | ORAL_TABLET | Freq: Every day | ORAL | Status: DC
  Administered 2019-06-01 – 2019-06-03 (×3): 100 mg via ORAL
  Filled 2019-06-01 (×3): qty 1

## 2019-06-01 MED ORDER — MAGNESIUM OXIDE 400 (241.3 MG) MG PO TABS
400.0000 mg | ORAL_TABLET | Freq: Every day | ORAL | Status: DC
  Administered 2019-06-02 – 2019-06-03 (×2): 400 mg via ORAL
  Filled 2019-06-01 (×3): qty 1

## 2019-06-01 MED ORDER — CYCLOBENZAPRINE HCL 10 MG PO TABS
10.0000 mg | ORAL_TABLET | Freq: Every day | ORAL | Status: DC
  Administered 2019-06-01 – 2019-06-02 (×2): 10 mg via ORAL
  Filled 2019-06-01 (×2): qty 1

## 2019-06-01 MED ORDER — NICOTINE 14 MG/24HR TD PT24
14.0000 mg | MEDICATED_PATCH | Freq: Every day | TRANSDERMAL | Status: DC
  Administered 2019-06-01 – 2019-06-03 (×3): 14 mg via TRANSDERMAL
  Filled 2019-06-01 (×3): qty 1

## 2019-06-01 MED ORDER — FOLIC ACID 1 MG PO TABS
1.0000 mg | ORAL_TABLET | Freq: Every day | ORAL | Status: DC
  Administered 2019-06-01 – 2019-06-03 (×3): 1 mg via ORAL
  Filled 2019-06-01 (×3): qty 1

## 2019-06-01 MED ORDER — ONDANSETRON HCL 4 MG/2ML IJ SOLN: 4.0000 mg | Freq: Four times a day (QID) | INTRAMUSCULAR | Status: DC | PRN

## 2019-06-01 MED ORDER — ADULT MULTIVITAMIN W/MINERALS CH: 1.0000 | ORAL_TABLET | Freq: Every day | ORAL | Status: DC

## 2019-06-01 MED ORDER — LORATADINE 10 MG PO TABS
10.0000 mg | ORAL_TABLET | Freq: Every day | ORAL | Status: DC
  Administered 2019-06-02 – 2019-06-03 (×2): 10 mg via ORAL
  Filled 2019-06-01 (×2): qty 1

## 2019-06-01 MED ORDER — ONDANSETRON HCL 4 MG PO TABS: 4.0000 mg | ORAL_TABLET | Freq: Four times a day (QID) | ORAL | Status: DC | PRN

## 2019-06-01 MED ORDER — FUROSEMIDE 10 MG/ML IJ SOLN
40.0000 mg | Freq: Once | INTRAMUSCULAR | Status: AC
  Administered 2019-06-01: 40 mg via INTRAVENOUS
  Filled 2019-06-01: qty 4

## 2019-06-01 MED ORDER — SPIRONOLACTONE 25 MG PO TABS: 25.0000 mg | ORAL_TABLET | Freq: Every day | ORAL | Status: DC

## 2019-06-01 MED ORDER — PANTOPRAZOLE SODIUM 40 MG IV SOLR
40.0000 mg | Freq: Two times a day (BID) | INTRAVENOUS | Status: DC
  Administered 2019-06-01 – 2019-06-03 (×4): 40 mg via INTRAVENOUS
  Filled 2019-06-01 (×4): qty 40

## 2019-06-01 MED ORDER — FUROSEMIDE 10 MG/ML IJ SOLN: 40.0000 mg | Freq: Once | INTRAMUSCULAR | Status: DC

## 2019-06-01 MED ORDER — ADULT MULTIVITAMIN W/MINERALS CH
1.0000 | ORAL_TABLET | Freq: Every day | ORAL | Status: DC
  Administered 2019-06-01 – 2019-06-03 (×3): 1 via ORAL
  Filled 2019-06-01 (×3): qty 1

## 2019-06-01 MED ORDER — UMECLIDINIUM BROMIDE 62.5 MCG/INH IN AEPB
1.0000 | INHALATION_SPRAY | Freq: Every day | RESPIRATORY_TRACT | Status: DC
  Administered 2019-06-02: 1 via RESPIRATORY_TRACT
  Filled 2019-06-01: qty 7

## 2019-06-01 MED ORDER — ALBUTEROL SULFATE (2.5 MG/3ML) 0.083% IN NEBU: 2.5000 mg | INHALATION_SOLUTION | Freq: Four times a day (QID) | RESPIRATORY_TRACT | Status: DC | PRN

## 2019-06-01 MED ORDER — CARVEDILOL 12.5 MG PO TABS
50.0000 mg | ORAL_TABLET | Freq: Two times a day (BID) | ORAL | Status: DC
  Administered 2019-06-01: 50 mg via ORAL
  Filled 2019-06-01: qty 4

## 2019-06-01 NOTE — ED Triage Notes (Signed)
Pt arrives via EMS from home with reports of SOB for a few days. States he has doubled up on some medications including coreg. Endorses weakness when walking and leg cramping at night.

## 2019-06-01 NOTE — H&P (Signed)
NAME:  [name], MRN:  [id number], DOB:  29-Jul-1965, LOS: 0 ADMISSION DATE:  06/01/2019, Primary: Hoy Register, MD  CHIEF COMPLAINT: Orthopnea  Medical Service: Internal Medicine Teaching Service         Attending Physician: Dr. Inez Catalina, MD    First Contact: Dr. Ephriam Knuckles Pager: [phone]  Second Contact: Dr. 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 Patient presented to the [facility] emergency department on 06/01/2019 for 2 to 3-day history of progressive orthopnea.  Patient notes that his carvedilol dose was recently increased by cardiology.  Since that time he has been experiencing worsening symptoms. He denies recent leg swelling, dizziness, or lightheadedness.  He does endorse left sternal border chest pain and palpitations. He has been using his heart failure medications intermittently.  He explains at this is because of his intermittent stays in jail where they do not allow him to take his medications. He denies recent cocaine use.  He does endorse smoking about 6 cigarettes a day.  Drinks about 3 beers a day.  Occasional marijuana use. Cardiac history: EF in 2017 15 to 20%.  Lexiscan in 2018 - for ischemia.  2019 echo revealed an EF of 20 to 25%.  Saint Jude ICD placed in 2019.  Most recent ICD interrogation revealed SVT at which time his Coreg was increased leading up to his presentation at this time.  Pt also notes both hematemesis and hematochezia.  He notes that the hematemesis has been over the past couple of days with a total of 4 episodes.  He endorses a thick dark red emesis that is appearing clotted.  He also  notes 2 episodes of hematochezia of which the first occurred last week.  He notes that his bowel movements are soft but unchanged.  Family history includes mother that died from pancreatic cancer.  He also notes a bulge that occurs in the epigastric region when he coughs.  This is associated with pain and he has to push it back in to get relief from that.  ED course  Vital signs stable on arrival to the ED.  Troponins stable but elevated at 30.  BNP elevated at 1000 from his baseline of around 200.  BMP shows a AKI.  No acute findings on chest x-ray.  EKG without significant changes since prior.  UDS positive for marijuana.  Past Medical History  He,  has a past medical history of CHF (congestive heart failure) (HCC), Headache, Hypertension, and Stroke (HCC) (02/24/2015).   Home Medications     Prior to Admission medications   Medication Sig Start Date End Date Taking? Authorizing Provider  carvedilol (COREG) 25 MG tablet Take 2 tablets (50 mg total) by mouth 2 (two) times daily. 05/30/19   Camnitz, Andree Coss, MD  cetirizine (ZYRTEC) 10 MG tablet Take 1 tablet (10 mg total) by mouth daily. 08/05/17   Hoy Register, MD  cyclobenzaprine (FLEXERIL) 10 MG tablet Take 1 tablet (10 mg total) by mouth at bedtime. 03/31/19   Hoy Register, MD  furosemide (LASIX) 40 MG tablet Take 40 mg by mouth as directed. 1/2 tablet daily  [provider]  hydrALAZINE (APRESOLINE) 50 MG tablet Take 1 tablet (50 mg total) by mouth 2 (two) times a day. 07/26/18 02/21/19  Erlene Quan, PA-C  magnesium oxide (MAG-OX) 400 MG tablet Take 1 tablet (400 mg total) by mouth daily. 11/02/17   Lendon Colonel, NP  Multiple Vitamin (MULTIVITAMIN WITH MINERALS) TABS tablet Take 1 tablet by mouth daily. 03/06/15   Angiulli, Lavon Paganini, PA-C  PROAIR HFA 108 (90 Base) MCG/ACT inhaler INHALE 2 PUFFS INTO THE LUNGS EVERY 6 (SIX) HOURS AS NEEDED FOR WHEEZING OR SHORTNESS OF BREATH. 11/19/18   [name], MD   sacubitril-valsartan (ENTRESTO) 97-103 MG Take 1 tablet by mouth 2 (two) times daily. 07/26/18   Erlene Quan, PA-C  spironolactone (ALDACTONE) 25 MG tablet Take 25 mg by mouth daily.    [provider]  tiotropium (SPIRIVA HANDIHALER) 18 MCG inhalation capsule Place 1 capsule (18 mcg total) into inhaler and inhale daily. 12/09/17   [name], MD  triamcinolone cream (KENALOG) 0.1 % Apply 1 application topically 2 (two) times daily. 05/10/18   [name], MD    Allergies    Allergies as of 06/01/2019 - Review Complete 06/01/2019  Allergen Reaction Noted  . Penicillins Other (See Comments) 12/01/2012    Social History   reports that he has been smoking cigarettes. He has a 20.00 pack-year smoking history. He has never used smokeless tobacco. He reports current alcohol use of about 1.0 standard drinks of alcohol per week. He reports current drug use. Drug: Cocaine.   Family History   His family history includes Hyperlipidemia in his mother; Hypertension in his brother, father, mother, and sister; Stroke in his maternal aunt.   Significant Hospital Events   5/12 hospital admission, cardiology consulted ROS  14 point review of system reviewed and negative unless stated in the HPI  Objective   Blood pressure (!) 117/98, pulse 77, temperature 98.2 F (36.8 C), temperature source Oral, resp. rate 20, height 5\' 8"  (1.727 m), weight 66 kg, SpO2 100 %.    Filed Weights   06/01/19 1111  Weight: 66 kg    Examination: GENERAL: Chronically ill appearing male in no acute distress.  Borderline cachectic appearing HEENT: Nasal cannula on CARDIAC: Heart regular rate and rhythm.  No JVD appreciated.  No hepatojugular reflex appreciated.  No lower extremity edema. PULMONARY: Breathing comfortably on 2 L nasal cannula.  Lung sounds clear. ABDOMEN: soft. Nontender to palpation.  Nondistended.  NEURO: Alert and oriented.  Strength 5 out of 5 in his bilateral upper and lower  extremities with slight deficit on the left due to prior stroke SKIN: No rash PSYCH: Normal affect  Consults:  Cardiology  Significant Diagnostic Tests:  EKG: Sinus rhythm, regular rate.  Consistent with LVH.  CXR: No acute findings  Labs    CBC Latest Ref Rng & Units 06/01/2019 04/11/2018 12/23/2017  WBC 4.0 - 10.5 K/uL 8.2 5.5 4.0  Hemoglobin 13.0 - 17.0 g/dL 15.7 12.0(L) 13.6  Hematocrit 39.0 - 52.0 % 45.4 34.6(L) 39.2  Platelets 150 - 400 K/uL 196 178 184   BMP Latest Ref Rng & Units 06/01/2019 10/04/2018 07/26/2018  Glucose 70 - 99 mg/dL 91 63(L) 69  BUN 6 - 20 mg/dL 18 15 10   Creatinine 0.61 - 1.24 mg/dL 1.61(H) 0.85 0.86  BUN/Creat Ratio 9 - 20 - 18 12  Sodium 135 - 145 mmol/L 137 141 140  Potassium 3.5 - 5.1 mmol/L 3.7 4.3 3.8  Chloride 98 -  111 mmol/L 95(L) 104 107(H)  CO2 22 - 32 mmol/L 25 22 19(L)  Calcium 8.9 - 10.3 mg/dL 87.5 9.7 9.4     Summary  54 year old male with nonischemic cardiomyopathy with combined heart failure (EF 20 to 25%, grade 1 diastolic dysfunction, Saint Jude ICD), hypertension and a history of substance abuse who was admitted to the internal medicine teaching service on 06/01/2019 for 2-day history of progressive orthopnea and weakness who also noted that he has been having hematemesis and hematochezia over the past week.  Assessment & Plan:  Active Problems:   HFrEF (heart failure with reduced ejection fraction) (HCC)  Nonischemic cardiomyopathy heart failure exacerbation.  Last EF 20 to 25% with grade 1 diastolic dysfunction.  Saint Jude pacemaker placed.  Possibly related to his difficulty with medication compliance. Hypertension.  Blood pressure stable on admission Home medications: Lasix 20 mg daily, hydralazine 50 mg twice daily, Entresto 97-103 mg twice daily, Coreg 50 mg twice daily, spironolactone 25 mg daily. Overall does not appear hypervolemic on exam however BNP is significantly elevated from baseline.  Unclear correlation with the  recent increase in his Coreg. Cardiology on board and we appreciate their recommendations.  Plan: Management per cardiology.  Planning to decrease his Coreg back down to 25 mg twice daily.  Diuresing with IV Lasix per cardiology.  Continue Entresto, spironolactone, and hydralazine.  Telemetry monitoring.  Strict I's and O's.  Acute kidney injury.  Baseline creatinine around 0.8.  Creatinine on admission 1.6.  Likely cardiorenal syndrome.  Possibly related to his diminished appetite with nausea and vomiting.  Suspect the nausea and vomiting might be due to his heart failure and may improve with diuresis. Plan: Continue to monitor renal function and electrolytes.  Hematemesis/hematochezia.  Unclear etiology.  Hemoglobin was stable on admission at 15.  No urgent indication for GI consult tonight however can discuss with them tomorrow.  Broad DDx in setting of prior substance abuse.  No recent abdominal imaging to evaluate for cirrhosis however patient notes that he used to drink a lot more so that would be overly surprising. Plan: Can consider getting an ultrasound tomorrow however I think endoscopy would be the most beneficial as he has not been previously scoped.  Would recommend having him medically optimized prior to this and would likely need to be done outpatient.  Obstructive sleep apnea.  Patient denies use of his CPAP at home.  Likely contributory to his heart failure.  Would encourage that he start using his CPAP. Plan: CPAP at bedtime  Alcohol use disorder.  Patient notes he drinks about 3 beers a day.  He denies a history of DTs.  He notes that he spends the weekends in jail and does just fine without the alcohol.  I do not think we need to do CIWA for now however will continue to monitor closely for signs or symptoms of withdrawal. Tobacco use.  Smokes about 6 cigarettes a day now.  This he also notes he can do without during his weekend stays in jail and does not feel like he needs a  nicotine patch. Best practice:  CODE STATUS: Full Diet: N.p.o. Pain management: Tylenol GI prophylaxis: PPI twice daily DVT for prophylaxis: Hold for active bleeding Social considerations/Family communication: Per patient Dispo: Admit patient to Inpatient with expected length of stay greater than 2 midnights.   Elige Radon, MD INTERNAL MEDICINE RESIDENT PGY-1 Pager # [phone] 06/01/19 6:58 PM
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
## 2019-06-01 NOTE — Hospital Course (Addendum)
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 male p/w SOB  *Acute respiratory failure secondary to HF failure exacerbation: Worsening shortness of breath, orthopnea, PND, right upper quadrant pain, and hematemesis.  Vital stable in the ED.  BNP 1000.  Troponins 30, 30.  Chest x-ray unremarkable.  EKG showed sinus rhythm, LVH, unchanged from prior.  Cardiology following, diuresing, holding on echo.   *AKI: Creatinine 1.6, baseline around 0.8.  Possibly cardiorenal.  Diuresing.  *Hematemesis and BRBPR: Noted dark red color few a few weeks. He reports this never happened before. Hgb stable at 15.7. VSS. Started on PPI, held anticoagulation.   Consults: Cardiology  Meds: Lasix VTE ppx: SCDs  IVF: None Diet: HH
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
## 2019-06-02 ENCOUNTER — Inpatient Hospital Stay (HOSPITAL_COMMUNITY): Payer: Medicaid Other

## 2019-06-02 DIAGNOSIS — I509 Heart failure, unspecified: Secondary | ICD-10-CM

## 2019-06-02 DIAGNOSIS — K920 Hematemesis: Secondary | ICD-10-CM

## 2019-06-02 LAB — BASIC METABOLIC PANEL
Anion gap: 12 (ref 5–15)
BUN: 24 mg/dL — ABNORMAL HIGH (ref 6–20)
CO2: 27 mmol/L (ref 22–32)
Calcium: 9.1 mg/dL (ref 8.9–10.3)
Chloride: 98 mmol/L (ref 98–111)
Creatinine, Ser: 1.29 mg/dL — ABNORMAL HIGH (ref 0.61–1.24)
GFR calc Af Amer: >60 mL/min (ref >60)
GFR calc non Af Amer: >60 mL/min (ref >60)
Glucose, Bld: 84 mg/dL (ref 70–99)
Potassium: 3.5 mmol/L (ref 3.5–5.1)
Sodium: 137 mmol/L (ref 135–145)

## 2019-06-02 LAB — COMPREHENSIVE METABOLIC PANEL
ALT: 18 U/L (ref 0–44)
AST: 25 U/L (ref 15–41)
Albumin: 3.9 g/dL (ref 3.5–5.0)
Alkaline Phosphatase: 47 U/L (ref 38–126)
Anion gap: 12 (ref 5–15)
BUN: 25 mg/dL — ABNORMAL HIGH (ref 6–20)
CO2: 29 mmol/L (ref 22–32)
Calcium: 9.6 mg/dL (ref 8.9–10.3)
Chloride: 97 mmol/L — ABNORMAL LOW (ref 98–111)
Creatinine, Ser: 1.35 mg/dL — ABNORMAL HIGH (ref 0.61–1.24)
GFR calc Af Amer: >60 mL/min (ref >60)
GFR calc non Af Amer: 60 mL/min — ABNORMAL LOW (ref >60)
Glucose, Bld: 84 mg/dL (ref 70–99)
Potassium: 3 mmol/L — ABNORMAL LOW (ref 3.5–5.1)
Sodium: 138 mmol/L (ref 135–145)
Total Bilirubin: 1.4 mg/dL — ABNORMAL HIGH (ref 0.3–1.2)
Total Protein: 6.4 g/dL — ABNORMAL LOW (ref 6.5–8.1)

## 2019-06-02 LAB — CBC
HCT: 41.3 % (ref 39.0–52.0)
Hemoglobin: 14.3 g/dL (ref 13.0–17.0)
MCH: 35.3 pg — ABNORMAL HIGH (ref 26.0–34.0)
MCHC: 34.6 g/dL (ref 30.0–36.0)
MCV: 102 fL — ABNORMAL HIGH (ref 80.0–100.0)
Platelets: 155 10*3/uL (ref 150–400)
RBC: 4.05 MIL/uL — ABNORMAL LOW (ref 4.22–5.81)
RDW: 12.8 % (ref 11.5–15.5)
WBC: 8.7 10*3/uL (ref 4.0–10.5)
nRBC: 0 % (ref 0.0–0.2)

## 2019-06-02 MED ORDER — POTASSIUM CHLORIDE CRYS ER 20 MEQ PO TBCR
20.0000 meq | EXTENDED_RELEASE_TABLET | Freq: Once | ORAL | Status: AC
  Administered 2019-06-02: 20 meq via ORAL
  Filled 2019-06-02: qty 1

## 2019-06-02 MED ORDER — FUROSEMIDE 10 MG/ML IJ SOLN
60.0000 mg | Freq: Once | INTRAMUSCULAR | Status: AC
  Administered 2019-06-02: 60 mg via INTRAVENOUS
  Filled 2019-06-02: qty 6

## 2019-06-02 MED ORDER — POTASSIUM CHLORIDE 10 MEQ/100ML IV SOLN
10.0000 meq | INTRAVENOUS | Status: AC
  Administered 2019-06-02 (×2): 10 meq via INTRAVENOUS
  Filled 2019-06-02 (×2): qty 100

## 2019-06-02 MED ORDER — POTASSIUM CHLORIDE CRYS ER 20 MEQ PO TBCR
40.0000 meq | EXTENDED_RELEASE_TABLET | Freq: Once | ORAL | Status: AC
  Administered 2019-06-02: 40 meq via ORAL
  Filled 2019-06-02: qty 2

## 2019-06-02 NOTE — Progress Notes (Signed)
NAME:  [name], MRN:  [id number], DOB:  07-Jun-1965, LOS: 1 ADMISSION DATE:  06/01/2019   Brief History  54 year old male with past medical history of nonischemic cardiomyopathy (EF 20 to 25% with grade 1 diastolic dysfunction), hypertension, polysubstance abuse, OSA, COPD and a history of 2 CVAs in the past.  Patient presents to [facility] emergency department on 5/12 for 2-day history of progressive orthopnea and generalized weakness. In the ED, BNP was found to be around 2000 which is up from his baseline of around 200.  Evidence of AKI was also present. Cardiac history: Patient followed by Dr. Oval Linsey.  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  Echocardiogram was obtained and showed an EF of 15 to 20%.  Lexiscan in 2018 was negative for ischemia.  2019 echocardiogram revealed an EF of 20 to 25%.  In 2019 [place] ICD was placed. Patient's carvedilol was recently increased by his cardiologist due to increased number of SVTs noted by his pacemaker interrogation.  Subjective  No overnight events.   Significant Hospital Events   5/12 hospital admission, cardiology consulted 5/13 obtaining evaluation for hematemesis and hematochezia.  Cardiology managing heart failure  Objective   Blood pressure 95/69, pulse 73, temperature 97.9 F (36.6 C), temperature source Oral, resp. rate 16, height 5\' 8"  (1.727 m), weight 64.4 kg, SpO2 100 %.     Intake/Output Summary (Last 24 hours) at 06/02/2019 0823 Last data filed at 06/02/2019 0700 Gross per 24 hour  Intake 474 ml  Output 525 ml  Net -51 ml   Filed Weights   06/01/19 1111 06/01/19 2053 06/02/19 0612  Weight: 66 kg 62.3 kg 64.4 kg    Examination: GENERAL: in no acute distress CARDIAC: heart RRR. No peripheral edema.  PULMONARY: acyanotic. Lung sounds clear to auscultation. ABDOMEN: soft. Nontender to palpation.  Nondistended.   Consults:  Cardiology  Significant Diagnostic Tests:    5/12 chest x-ray: No acute process  Micro Data:  n/a  Antimicrobials:  n/a  Labs    CBC Latest Ref Rng & Units 06/01/2019 04/11/2018 12/23/2017  WBC 4.0 - 10.5 K/uL 8.2 5.5 4.0  Hemoglobin 13.0 - 17.0 g/dL 15.7 12.0(L) 13.6  Hematocrit 39.0 - 52.0 % 45.4 34.6(L) 39.2  Platelets 150 - 400 K/uL 196 178 184   BMP Latest Ref Rng & Units 06/01/2019 10/04/2018 07/26/2018  Glucose 70 - 99 mg/dL 91 63(L) 69  BUN 6 - 20 mg/dL 18 15 10   Creatinine 0.61 - 1.24 mg/dL 1.61(H) 0.85 0.86  BUN/Creat Ratio 9 - 20 - 18 12  Sodium 135 - 145 mmol/L 137 141 140  Potassium 3.5 - 5.1 mmol/L 3.7 4.3 3.8  Chloride 98 - 111 mmol/L 95(L) 104 107(H)  CO2 22 - 32 mmol/L 25 22 19(L)  Calcium 8.9 - 10.3 mg/dL 10.1 9.7 9.4    Summary  54 year old male who presented to [facility] emergency department on 5/12 for 2-day history of progressive orthopnea and generalized weakness and was admitted to IMTS for decompensated heart failure managed.  Patient also noted hematochezia and hematemesis on admission.  Assessment & Plan:  Active Problems:   HFrEF (heart failure with reduced ejection fraction) (HCC)  Decompensated heart failure with reduced ejection fraction.  EF 20 to 25%.  Status post [facility] pacemaker placed 2019.   Dry weight may be around 66 kg.  Patient actually 65 kg on admission. Nonischemic cardiomyopathy.  Per Lexiscan in 2018 Not appearing  hypervolemic on exam however BNP elevated at 1000. Hypertension QT prolongation.  QTc 509 on admission Plan: Management per cardiology.  Holding Coreg and spironolactone this morning due to soft pressures.  Diuresing.  Strict I/O's, daily weights  Acute kidney injury. Creatinine around 0.8.  Creatinine 1.6 on admission.  Now improving.  Cardiorenal syndrome versus volume depletion due to vomiting Plan: We will need to watch renal function carefully as cardiology diuresis.  Hematemesis/hematochezia.  No further episodes since admission.  No prior colonoscopy  or EGD in the past.  Patient does have a significant history for alcohol use.  No recent hepatic imaging>cirrhosis>varicies.  Right upper quadrant ultrasound not showing findings consistent with cirrhosis. Hemoglobin stable but will consider obtaining a GI consult--doubtful that endoscopy will be able to be performed until decompensated heart failure improves.  Alcohol use disorder.  Patient endorses drinking 3 beers a day.  No history of DTs however will continue to monitor for signs of withdrawal and treat accordingly.   Best practice:  CODE STATUS: Full Diet: heart healthy DVT for prophylaxis: Lovenox Social considerations/Family communication: Per patient Dispo: Pending further evaluation and management.   Elige Radon, MD INTERNAL MEDICINE RESIDENT PGY-1 PAGER #: [phone] 06/02/19  8:23 AM

## 2019-06-02 NOTE — Progress Notes (Addendum)
Progress Note  Patient Name: SUEDE GREENAWALT Date of Encounter: 06/02/2019  Primary Cardiologist: Chilton Si, MD   Subjective   Breathing same. Unable to urinate much.   Inpatient Medications    Scheduled Meds: . carvedilol  50 mg Oral BID  . cyclobenzaprine  10 mg Oral QHS  . folic acid  1 mg Oral Daily  . loratadine  10 mg Oral Daily  . magnesium oxide  400 mg Oral Daily  . multivitamin with minerals  1 tablet Oral Daily  . nicotine  14 mg Transdermal Daily  . pantoprazole (PROTONIX) IV  40 mg Intravenous Q12H  . spironolactone  25 mg Oral Daily  . thiamine  100 mg Oral Daily  . umeclidinium bromide  1 puff Inhalation Daily   Continuous Infusions:  PRN Meds: albuterol, ondansetron **OR** ondansetron (ZOFRAN) IV   Vital Signs    Vitals:   06/01/19 2006 06/01/19 2053 06/01/19 2057 06/02/19 0612  BP: 122/90  (!) 131/99 95/69  Pulse: 67  69 73  Resp: 16  16   Temp: 98.3 F (36.8 C)  97.6 F (36.4 C) 97.9 F (36.6 C)  TempSrc: Oral  Oral Oral  SpO2: 100%  100% 100%  Weight:  62.3 kg  64.4 kg  Height:  5\' 8"  (1.727 m)      Intake/Output Summary (Last 24 hours) at 06/02/2019 0827 Last data filed at 06/02/2019 0700 Gross per 24 hour  Intake 474 ml  Output 525 ml  Net -51 ml   Last 3 Weights 06/02/2019 06/01/2019 06/01/2019  Weight (lbs) 141 lb 15.6 oz 137 lb 4.8 oz 145 lb 8.1 oz  Weight (kg) 64.4 kg 62.279 kg 66 kg      Telemetry    No new tracing   ECG    NSR - Personally Reviewed  Physical Exam   GEN: No acute distress.   Neck: No JVD Cardiac: RRR, no murmurs, rubs, or gallops.  Respiratory: Clear to auscultation bilaterally. GI: Soft, nontender, + distended  MS: No edema; No deformity. Neuro:  Nonfocal  Psych: Normal affect   Labs    High Sensitivity Troponin:   Recent Labs  Lab 06/01/19 1118 06/01/19 1344  TROPONINIHS 30* 30*      Chemistry Recent Labs  Lab 06/01/19 1118  NA 137  K 3.7  CL 95*  CO2 25  GLUCOSE 91    BUN 18  CREATININE 1.61*  CALCIUM 10.1  PROT 7.3  ALBUMIN 4.1  AST 44*  ALT 24  ALKPHOS 61  BILITOT 1.8*  GFRNONAA 48*  GFRAA 56*  ANIONGAP 17*     Hematology Recent Labs  Lab 06/01/19 1118  WBC 8.2  RBC 4.48  HGB 15.7  HCT 45.4  MCV 101.3*  MCH 35.0*  MCHC 34.6  RDW 12.7  PLT 196    BNP Recent Labs  Lab 06/01/19 1344  BNP 1,013.0*     DDimer  Recent Labs  Lab 06/01/19 1118  DDIMER 0.42     Radiology    DG Chest Port 1 View  Result Date: 06/01/2019 CLINICAL DATA:  Chest pain and shortness of breath. EXAM: PORTABLE CHEST 1 VIEW COMPARISON:  Chest x-ray dated April 11, 2018. FINDINGS: Unchanged left chest wall pacemaker. The heart remains at the upper limits of normal in size. Normal mediastinal contours. Normal pulmonary vascularity. No focal consolidation, pleural effusion, or pneumothorax. No acute osseous abnormality. IMPRESSION: No active disease. Electronically Signed   By: April 13, 2018.D.  On: 06/01/2019 11:51    Cardiac Studies   Pending abdominal ultrasound  Patient Profile     54 y.o. 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   Assessment & Plan    1. Acute on Chronic combined CHF in setting of non compliance - BNP 1000. CXR clear  - Given Soft blood pressure this morning will hold Coreg and spironolactone.  Entresto did not started  [employer].  - Continue Lasix and watch renal function >>patient report not significant output>pending abdominal ultrasound>> concern for gut edema in setting of alcohol abuse - strict I & O and daily weight  As his blood pressure tolerates, would start adding back medications.  In theory 1 would restart Entresto over carvedilol due to CHF exacerbation, however with likely ongoing volume overload, either being held.  - Add heart failure regiment as BP allows  2. Presumed NICM s/p  ICD - Negative myoview in 2018. - Meds as above - Last echo 10/2017 showed improved LVEF to 20-25% and grade 1 DD  3. AKI - Scr 1.61 yesterday (baseline 0.8) - Pending lab today  Otherwise per primary team  4. Hematemesis 5. Alcohol abuse 6.OSA       For questions or updates, please contact [place] Please consult [email or web address] for contact info under        Signed, Leanor Kail, PA  06/02/2019, 8:27 AM     ATTENDING ATTESTATION  I have seen, examined and evaluated the patient this PM along with Mr. Curly [place].  After reviewing all the available data and chart, we discussed the patients laboratory, study & physical findings as well as symptoms in detail. I agree with his findings, examination as well as impression recommendations as per our discussion.    Attending adjustments noted in italics.   He diuresed some with low-dose Lasix, received more this morning and had a better response.  Would plan in place him on 60 mg twice daily Lasix for the next couple days.  I truthfully think that if we were able to get a good diuresis on him, a lot of his GI symptoms will improve.  Blood pressure being low is interesting because he is on significant dose of Entresto and carvedilol at home.  I would like to get back on some of carvedilol day and like to restart Entresto as well.    If he does not have any more brisk rate response to increased dose of Lasix, may need to consider right heart cath to confirm presumed diagnosis.      Glenetta Hew, M.D., M.S. Interventional Cardiologist   Pager # [phone] Phone # [phone] [address] [place], [place] [address]

## 2019-06-02 NOTE — Progress Notes (Signed)
Heart Failure Stewardship Pharmacist Progress Note   PCP: Hoy Register, MD PCP-Cardiologist: Chilton Si, MD    HPI:  54 yo M with PMH significant for HTN, prior CVA, chronic systolic and diastolic HF s/p ICD, OSA, non-compliance, and polysubstance abuse admitted on 06/01/19 for shortness of breath and acute HF exacerbation in the setting of non-compliance.   ECHO 10/2017 with LVEF 20-25%. ICD implanted 12/2017.  Current HF Medications: Furosemide 60 mg IV x 1  Prior to admission HF Medications: Furosemide 60 mg BID Carvedilol 25 mg BID Entresto 97/103 mg BID Hydralazine 50 mg BID  *Has not tolerated Bidil in the past due to headaches  Pertinent Lab Values: . Serum creatinine 1.29, BUN 24, Potassium 3.5, Sodium 137, BNP 1013  Vital Signs: . Weight: 141 lbs (estimated dry weight: 140-145 lbs) . Blood pressure: 90/60s . Heart rate: 70s  Medication Assistance / Insurance Benefits Check: Does the patient have prescription insurance?   yes Type of insurance plan: [place] Medicaid  Does the patient qualify for medication assistance through manufacturers or grants?   Pending HF medication selection and household income requirements  Eligible grants and/or patient assistance programs: Pending  Medication assistance applications in progress: None  Medication assistance applications approved: None  Approved medication assistance renewals will be completed by: Dr. Leonides Sake office  Outpatient Pharmacy:  Prior to admission outpatient pharmacy: [facility]; [facility] Is the patient willing to use [facility] TOC pharmacy at discharge?   TBD Is the patient willing to transition their outpatient pharmacy to utilize a [facility] outpatient pharmacy?   TBD    Assessment: 1. Acute on chronic systolic and diastolic CHF (EF 25-42%), due to polysubstance abuse and non-compliance. NYHA class III symptoms. Concern for gut edema on MD exam, otherwise no LE edema  noted. CXR clear.  - Continue gentle diuresis with furosemide. May need RHC during hospitalization. - Restart carvedilol once volume status stable and BP improves - Restart Entresto as able pending BP improvement - Consider adding spironolactone prior to discharge - Consider SGLT2i at discharge   Plan: 1) Medication changes recommended at this time: - None - BP soft with current regimen  2) Patient assistance application(s): - None pending  3)  Education  - To be completed prior to discharge  Danae Orleans, PharmD, BCPS Heart Failure Stewardship Pharmacist Phone [phone] 06/02/2019       4:58 PM

## 2019-06-02 NOTE — Plan of Care (Signed)
  Problem: Safety: Goal: Ability to remain free from injury will improve Outcome: Progressing   Problem: Cardiac: Goal: Ability to achieve and maintain adequate cardiopulmonary perfusion will improve Outcome: Progressing   

## 2019-06-03 ENCOUNTER — Encounter (HOSPITAL_COMMUNITY)
Admission: EM | Disposition: A | Discharge status: Home / Self Care | Payer: Self-pay | Source: Home / Self Care | Attending: Internal Medicine

## 2019-06-03 DIAGNOSIS — R1010 Upper abdominal pain, unspecified: Secondary | ICD-10-CM

## 2019-06-03 DIAGNOSIS — I5020 Unspecified systolic (congestive) heart failure: Secondary | ICD-10-CM

## 2019-06-03 DIAGNOSIS — I952 Hypotension due to drugs: Secondary | ICD-10-CM

## 2019-06-03 HISTORY — PX: RIGHT HEART CATH: CATH118263

## 2019-06-03 LAB — BASIC METABOLIC PANEL
Anion gap: 8 (ref 5–15)
BUN: 20 mg/dL (ref 6–20)
CO2: 26 mmol/L (ref 22–32)
Calcium: 9.3 mg/dL (ref 8.9–10.3)
Chloride: 105 mmol/L (ref 98–111)
Creatinine, Ser: 1.15 mg/dL (ref 0.61–1.24)
GFR calc Af Amer: >60 mL/min (ref >60)
GFR calc non Af Amer: >60 mL/min (ref >60)
Glucose, Bld: 89 mg/dL (ref 70–99)
Potassium: 4.1 mmol/L (ref 3.5–5.1)
Sodium: 139 mmol/L (ref 135–145)

## 2019-06-03 LAB — CBC
HCT: 38.6 % — ABNORMAL LOW (ref 39.0–52.0)
Hemoglobin: 13 g/dL (ref 13.0–17.0)
MCH: 34.7 pg — ABNORMAL HIGH (ref 26.0–34.0)
MCHC: 33.7 g/dL (ref 30.0–36.0)
MCV: 102.9 fL — ABNORMAL HIGH (ref 80.0–100.0)
Platelets: 134 10*3/uL — ABNORMAL LOW (ref 150–400)
RBC: 3.75 MIL/uL — ABNORMAL LOW (ref 4.22–5.81)
RDW: 12.7 % (ref 11.5–15.5)
WBC: 5.4 10*3/uL (ref 4.0–10.5)
nRBC: 0 % (ref 0.0–0.2)

## 2019-06-03 LAB — POCT I-STAT EG7
Acid-Base Excess: 1 mmol/L (ref 0.0–2.0)
Acid-Base Excess: 1 mmol/L (ref 0.0–2.0)
Bicarbonate: 25.8 mmol/L (ref 20.0–28.0)
Bicarbonate: 26.5 mmol/L (ref 20.0–28.0)
Calcium, Ion: 1.33 mmol/L (ref 1.15–1.40)
Calcium, Ion: 1.35 mmol/L (ref 1.15–1.40)
HCT: 39 % (ref 39.0–52.0)
HCT: 39 % (ref 39.0–52.0)
Hemoglobin: 13.3 g/dL (ref 13.0–17.0)
Hemoglobin: 13.3 g/dL (ref 13.0–17.0)
O2 Saturation: 69 %
O2 Saturation: 74 %
Potassium: 4.1 mmol/L (ref 3.5–5.1)
Potassium: 4.1 mmol/L (ref 3.5–5.1)
Sodium: 139 mmol/L (ref 135–145)
Sodium: 139 mmol/L (ref 135–145)
TCO2: 27 mmol/L (ref 22–32)
TCO2: 28 mmol/L (ref 22–32)
pCO2, Ven: 41.9 mmHg — ABNORMAL LOW (ref 44.0–60.0)
pCO2, Ven: 43 mmHg — ABNORMAL LOW (ref 44.0–60.0)
pH, Ven: 7.397 (ref 7.250–7.430)
pH, Ven: 7.398 (ref 7.250–7.430)
pO2, Ven: 36 mmHg (ref 32.0–45.0)
pO2, Ven: 39 mmHg (ref 32.0–45.0)

## 2019-06-03 LAB — MAGNESIUM: Magnesium: 1.3 mg/dL — ABNORMAL LOW (ref 1.7–2.4)

## 2019-06-03 SURGERY — RIGHT HEART CATH

## 2019-06-03 MED ORDER — HYDRALAZINE HCL 20 MG/ML IJ SOLN: 10.0000 mg | INTRAMUSCULAR | Status: DC | PRN

## 2019-06-03 MED ORDER — PANTOPRAZOLE SODIUM 40 MG PO TBEC
40.0000 mg | DELAYED_RELEASE_TABLET | Freq: Every day | ORAL | 0 refills | Status: DC
Start: 2019-06-03 — End: 2019-06-07

## 2019-06-03 MED ORDER — SODIUM CHLORIDE 0.9 % IV SOLN: INTRAVENOUS | Status: DC

## 2019-06-03 MED ORDER — MIDAZOLAM HCL 2 MG/2ML IJ SOLN
INTRAMUSCULAR | Status: AC
  Filled 2019-06-03: qty 2

## 2019-06-03 MED ORDER — CARVEDILOL 6.25 MG PO TABS: 6.2500 mg | ORAL_TABLET | Freq: Two times a day (BID) | ORAL | Status: DC

## 2019-06-03 MED ORDER — HEPARIN (PORCINE) IN NACL 1000-0.9 UT/500ML-% IV SOLN
INTRAVENOUS | Status: AC
  Filled 2019-06-03: qty 500

## 2019-06-03 MED ORDER — SODIUM CHLORIDE 0.9% FLUSH: 3.0000 mL | Freq: Two times a day (BID) | INTRAVENOUS | Status: DC

## 2019-06-03 MED ORDER — HEPARIN (PORCINE) IN NACL 1000-0.9 UT/500ML-% IV SOLN
INTRAVENOUS | Status: DC | PRN
  Administered 2019-06-03: 500 mL

## 2019-06-03 MED ORDER — ACETAMINOPHEN 325 MG PO TABS: 650.0000 mg | ORAL_TABLET | ORAL | Status: DC | PRN

## 2019-06-03 MED ORDER — SODIUM CHLORIDE 0.9% FLUSH: 3.0000 mL | INTRAVENOUS | Status: DC | PRN

## 2019-06-03 MED ORDER — SODIUM CHLORIDE 0.9 % IV SOLN: 250.0000 mL | INTRAVENOUS | Status: DC | PRN

## 2019-06-03 MED ORDER — CARVEDILOL 6.25 MG PO TABS: 6.2500 mg | ORAL_TABLET | Freq: Two times a day (BID) | ORAL | 0 refills | Status: DC

## 2019-06-03 MED ORDER — ENTRESTO 97-103 MG PO TABS: 0.5000 | ORAL_TABLET | Freq: Two times a day (BID) | ORAL | 0 refills | Status: DC

## 2019-06-03 MED ORDER — MAGNESIUM OXIDE 400 (241.3 MG) MG PO TABS: 800.0000 mg | ORAL_TABLET | Freq: Two times a day (BID) | ORAL | Status: DC

## 2019-06-03 MED ORDER — LABETALOL HCL 5 MG/ML IV SOLN: 10.0000 mg | INTRAVENOUS | Status: DC | PRN

## 2019-06-03 MED ORDER — ONDANSETRON HCL 4 MG/2ML IJ SOLN: 4.0000 mg | Freq: Four times a day (QID) | INTRAMUSCULAR | Status: DC | PRN

## 2019-06-03 MED ORDER — MIDAZOLAM HCL 2 MG/2ML IJ SOLN
INTRAMUSCULAR | Status: DC | PRN
  Administered 2019-06-03: 1 mg via INTRAVENOUS

## 2019-06-03 MED ORDER — LIDOCAINE HCL (PF) 1 % IJ SOLN
INTRAMUSCULAR | Status: DC | PRN
  Administered 2019-06-03: 2 mL

## 2019-06-03 MED ORDER — LIDOCAINE HCL (PF) 1 % IJ SOLN
INTRAMUSCULAR | Status: AC
  Filled 2019-06-03: qty 30

## 2019-06-03 MED ORDER — MAGNESIUM SULFATE 4 GM/100ML IV SOLN
4.0000 g | Freq: Once | INTRAVENOUS | Status: DC
  Filled 2019-06-03: qty 100

## 2019-06-03 SURGICAL SUPPLY — 6 items
CATH BALLN WEDGE 5F 110CM (CATHETERS) ×2 IMPLANT
PACK CARDIAC CATHETERIZATION (CUSTOM PROCEDURE TRAY) ×2 IMPLANT
SHEATH GLIDE SLENDER 4/5FR (SHEATH) ×2 IMPLANT
TRANSDUCER W/STOPCOCK (MISCELLANEOUS) ×2 IMPLANT
TUBING ART PRESS 72  MALE/FEM (TUBING) ×3
TUBING ART PRESS 72 MALE/FEM (TUBING) IMPLANT

## 2019-06-03 NOTE — Care Management (Signed)
1327 06-03-19 Case Manager received consult for Farxiga. Marcelline Deist is on the Medicaid preferred medication list. Graves-Bigelow, Lamar Laundry, RN,BSN Case Manager

## 2019-06-03 NOTE — Progress Notes (Signed)
NAME:  [name], MRN:  [id number], DOB:  May 03, 1965, LOS: 2 ADMISSION DATE:  06/01/2019   Brief History  54 year old male with past medical history of nonischemic cardiomyopathy (EF 20 to 25% with grade 1 diastolic dysfunction), hypertension, polysubstance abuse, OSA, COPD and a history of 2 CVAs in the past.  Patient presents to [facility] emergency department on 5/12 for 2-day history of progressive orthopnea and generalized weakness. In the ED, BNP was found to be around 2000 which is up from his baseline of around 200.  Evidence of AKI was also present. Cardiac history: Patient followed by Dr. Oval Linsey.  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  Echocardiogram was obtained and showed an EF of 15 to 20%.  Lexiscan in 2018 was negative for ischemia.  2019 echocardiogram revealed an EF of 20 to 25%.  In 2019 [place] ICD was placed. Patient's carvedilol was recently increased by his cardiologist due to increased number of SVTs noted by his pacemaker interrogation.  Subjective  No overnight events. No complaints this morning.  Patient noted that cardiology is considering discharge tomorrow  [place]   5/12 hospital admission, cardiology consulted 5/13 RUQ [name] normal. Cardiology managing heart failure 5/14 weight down 5 pounds, patient feeling better, possible right heart cath later today  Objective   Blood pressure 95/71, pulse 82, temperature 98.1 F (36.7 C), temperature source Oral, resp. rate 18, height 5\' 8"  (1.727 m), weight 64.4 kg, SpO2 100 %.     Intake/Output Summary (Last 24 hours) at 06/03/2019 0544 Last data filed at 06/02/2019 2200 Gross per 24 hour  Intake 150 ml  Output 625 ml  Net -475 ml   Filed Weights   06/01/19 1111 06/01/19 2053 06/02/19 0612  Weight: 66 kg 62.3 kg 64.4 kg    Examination: GENERAL: in no acute distress CARDIAC: heart RRR. No peripheral edema.  PULMONARY: Lungs clear ABDOMEN:  Nondistended  Consults:  Cardiology  Significant Diagnostic Tests:  5/12 chest x-ray: No acute process 5/13 RUQ [name]: no evidence of cirrhosis  Micro Data:  n/a  Antimicrobials:  n/a  Labs    CBC Latest Ref Rng & Units 06/02/2019 06/01/2019 04/11/2018  WBC 4.0 - 10.5 K/uL 8.7 8.2 5.5  Hemoglobin 13.0 - 17.0 g/dL 14.3 15.7 12.0(L)  Hematocrit 39.0 - 52.0 % 41.3 45.4 34.6(L)  Platelets 150 - 400 K/uL 155 196 178   BMP Latest Ref Rng & Units 06/02/2019 06/02/2019 06/01/2019  Glucose 70 - 99 mg/dL 84 84 91  BUN 6 - 20 mg/dL 24(H) 25(H) 18  Creatinine 0.61 - 1.24 mg/dL 1.29(H) 1.35(H) 1.61(H)  BUN/Creat Ratio 9 - 20 - - -  Sodium 135 - 145 mmol/L 137 138 137  Potassium 3.5 - 5.1 mmol/L 3.5 3.0(L) 3.7  Chloride 98 - 111 mmol/L 98 97(L) 95(L)  CO2 22 - 32 mmol/L 27 29 25   Calcium 8.9 - 10.3 mg/dL 9.1 9.6 10.1    Summary  54 year old male who presented to [facility] emergency department on 5/12 for 2-day history of progressive orthopnea and generalized weakness and was admitted to IMTS for decompensated heart failure managed.  Patient also noted hematochezia and hematemesis on admission.  Assessment & Plan:  Active Problems:   HFrEF (heart failure with reduced ejection fraction) (HCC)  NICM Decompensated heart failure with reduced ejection fraction.  EF 20 to 25%.  Status post [employer] Jude pacemaker placed 2019.   Weight down 5 pounds  from admission Hypertension QT prolongation.  QTc 509 on admission Plan: Management per cardiology.  Continue diuresing.  Cardiology noting he may take great heart cath later today to help guide resuming his Entresto and Coreg.  Strict I/O's, daily weights  Acute kidney injury.  Resolved  Hematemesis/hematochezia.  No further episodes since admission.  No prior colonoscopy or EGD in the past.  Obtained a right upper quadrant ultrasound to evaluate for cirrhosis given his history of alcohol use however ultrasound did not reveal cirrhosis or fibrosis.  This  requires any further evaluation in the hospital however will need outpatient GI referral  Best practice:  CODE STATUS: Full Diet: heart healthy DVT for prophylaxis: Lovenox Social considerations/Family communication: Per patient Dispo: Pending further evaluation and management.  Possible discharge tomorrow pending cardiology sign off   Elige Radon, MD INTERNAL MEDICINE RESIDENT PGY-1 PAGER #: [phone] 06/03/19  5:44 AM

## 2019-06-03 NOTE — Discharge Summary (Signed)
Name: [name] MRN: [id number] DOB: [birth year] 54 y.o. PCP: [name], MD  Date of Admission: 06/01/2019 11:03 AM Date of Discharge: 06/03/2019 Attending Physician: Dr. Gilles Chiquito  Discharge Diagnosis: 1. Acute on chronic combined heart failure in setting of non-compliance 2. NICM s/p ICD 3. AKI 4. Hematemesis/hematochezia  Discharge Medications: Allergies as of 06/03/2019      Reactions   Penicillins Other (See Comments)   Blisters  Has patient had a PCN reaction causing immediate rash, facial/tongue/throat swelling, SOB or lightheadedness with hypotension: Yes Has patient had a PCN reaction causing severe rash involving mucus membranes or skin necrosis: Yes Has patient had a PCN reaction that required hospitalization: No Has patient had a PCN reaction occurring within the last 10 years: No If all of the above answers are "NO", then may proceed with Cephalosporin use.      Medication List    STOP taking these medications   magnesium oxide 400 MG tablet Commonly known as: MAG-OX   multivitamin with minerals Tabs tablet     TAKE these medications   aspirin EC 81 MG tablet Take 81 mg by mouth daily.   carvedilol 6.25 MG tablet Commonly known as: COREG Take 1 tablet (6.25 mg total) by mouth 2 (two) times daily with a meal. What changed:   medication strength  how much to take  when to take this   cetirizine 10 MG tablet Commonly known as: ZYRTEC Take 1 tablet (10 mg total) by mouth daily.   cyclobenzaprine 10 MG tablet Commonly known as: FLEXERIL Take 1 tablet (10 mg total) by mouth at bedtime. What changed:   when to take this  reasons to take this   Entresto 97-103 MG Generic drug: sacubitril-valsartan Take 0.5 tablets by mouth 2 (two) times daily. What changed: how much to take   furosemide 40 MG tablet Commonly known as: LASIX Take 60 mg by mouth 2 (two) times daily.   hydrALAZINE 50 MG tablet Commonly known as:  APRESOLINE Take 1 tablet (50 mg total) by mouth 2 (two) times a day.   pantoprazole 40 MG tablet Commonly known as: PROTONIX Take 1 tablet (40 mg total) by mouth daily.   ProAir HFA 108 (90 Base) MCG/ACT inhaler Generic drug: albuterol INHALE 2 PUFFS INTO THE LUNGS EVERY 6 (SIX) HOURS AS NEEDED FOR WHEEZING OR SHORTNESS OF BREATH. What changed: See the new instructions.   tiotropium 18 MCG inhalation capsule Commonly known as: Spiriva HandiHaler Place 1 capsule (18 mcg total) into inhaler and inhale daily.   triamcinolone cream 0.1 % Commonly known as: KENALOG Apply 1 application topically 2 (two) times daily. What changed:   when to take this  reasons to take this       Disposition and follow-up:   [name] was discharged from [facility] in Stable condition.  At the hospital follow up visit please address:  1. NICM s/p ICD. Acute on chronic combined heart failure in setting of non-compliance. Pt noted that he is unable to take his heart failure medications during his weekend stays in jail. Unclear if this is the whole picture however encouraged him to continue to take as directed by cardiology.  Underwent diuresis. Underwent RHC on day of discharge which showed normal right heart pressures. Wedge pressure <45mmHg. CO 4.25L/min. -Carvedilol continued at discharge however cardiology is wanting to hold entresto for now given his SBPs being in the low 100s. -Will need close outpatient follow up with cardiology for  further evaluation of BP/CHF regimen  2. AKI. Likely cardiorenal. Resolved with diuresis.  3. Hematemesis/hematochezia. Pt had reported this on admission. He noted 2 episodes of dark red blood in his stools that occurred last week and had 2 episodes of dark red emesis a few days ago.  Given his history of alcohol use, I obtained a RUQ [name] to evaluate for possible cirrhosis>varicies. RUQ [name] was normal. Due to being in an acute heart failure  exacerbation, we did not consult GI during admission, as he did not have any further episodes and hgb was 15 on admission indicating that it was unlikely this was a major bleed. -obtain a repeat CBC at follow up to further evaluate -GI referral for colonoscopy/EGD  2.  Labs / imaging needed at time of follow-up: CBC, BMP  3.  Pending labs/ test needing follow-up: none  Follow-up Appointments: Follow-up Information    Hoy Register, MD. Schedule an appointment as soon as possible for a visit in 1 week(s).   Specialty: Family Medicine Contact information: [address] [place] [address] [phone]        Chilton Si, MD .   Specialty: Cardiology Contact information: [address] [place] [address] [phone]           Hospital Course by problem list: 54 yo male with NICM s/p ICD and combined HF (EF 20-25%) who presented to [facility] on 06/01/19 for 2d history of progressive orthopnea and generalized weakness. Cardiology was consulted and pt underwent diuresis. Due to lower blood pressures and AKI, entresto was held. He underwent RHC on day of discharge (5/14) which showed normal right heart pressures and a wedge pressure of <10.   Pt had reported hematemesis and hematochezia on admission. He noted 2 episodes of dark red blood in his stools that occurred last week and had 2 episodes of dark red emesis a few days ago.  Given his history of alcohol use, I obtained a RUQ [name] to evaluate for possible cirrhosis>varicies. RUQ [name] was normal. Due to being in an acute heart failure exacerbation, we did not consult GI during admission, as he did not have any further episodes and hgb was 15 on admission indicating that it was unlikely this was a major bleed. Hgb remained stable throughout his hospitalization and he was instructed to follow up with GI at discharge.  Discharge Vitals:   BP 127/89   Pulse 94   Temp 98.7 F (37.1 C) (Oral)   Resp 18   Ht 5'  8" (1.727 m)   Wt 63.2 kg   SpO2 99%   BMI 21.20 kg/m   Pertinent Labs, Studies, and Procedures:  CBC Latest Ref Rng & Units 06/03/2019 06/03/2019 06/03/2019  WBC 4.0 - 10.5 K/uL - - 5.4  Hemoglobin 13.0 - 17.0 g/dL 38.1 77.1 16.5  Hematocrit 39.0 - 52.0 % 39.0 39.0 38.6(L)  Platelets 150 - 400 K/uL - - 134(L)   BMP Latest Ref Rng & Units 06/03/2019 06/03/2019 06/03/2019  Glucose 70 - 99 mg/dL - - 89  BUN 6 - 20 mg/dL - - 20  Creatinine 7.90 - 1.24 mg/dL - - 3.83  BUN/Creat Ratio 9 - 20 - - -  Sodium 135 - 145 mmol/L 139 139 139  Potassium 3.5 - 5.1 mmol/L 4.1 4.1 4.1  Chloride 98 - 111 mmol/L - - 105  CO2 22 - 32 mmol/L - - 26  Calcium 8.9 - 10.3 mg/dL - - 9.3   3/38 RHC  Normal right heart pressures.  Mean pulmonary capillary wedge pressure 3 mmHg  Right ankle output 4.25 L/min with an index of 6.43 L/min/m  Discharge Instructions: Discharge Instructions    (HEART FAILURE PATIENTS) Call MD:  Anytime you have any of the following symptoms: 1) 3 pound weight gain in 24 hours or 5 pounds in 1 week 2) shortness of breath, with or without a dry hacking cough 3) swelling in the hands, feet or stomach 4) if you have to sleep on extra pillows at night in order to breathe.   Complete by: As directed    Call MD for:  difficulty breathing, headache or visual disturbances   Complete by: As directed    Call MD for:  extreme fatigue   Complete by: As directed    Call MD for:  persistant dizziness or light-headedness   Complete by: As directed    Call MD for:  redness, tenderness, or signs of infection (pain, swelling, redness, odor or green/yellow discharge around incision site)   Complete by: As directed    Heart Failure patients record your daily weight using the same scale at the same time of day   Complete by: As directed       Signed: Elige Radon, MD 06/03/2019, 8:51 PM   Pager: [phone]

## 2019-06-03 NOTE — Progress Notes (Addendum)
Heart Failure Stewardship Pharmacist Progress Note   PCP: Hoy Register, MD PCP-Cardiologist: Chilton Si, MD    HPI:  54 yo M with PMH significant for HTN, prior CVA, chronic systolic and diastolic HF s/p ICD, OSA, non-compliance, and polysubstance abuse admitted on 06/01/19 for shortness of breath and acute HF exacerbation in the setting of non-compliance.   ECHO 10/2017 with LVEF 20-25%. ICD implanted 12/2017.  RHC scheduled for today  Current HF Medications: None  Prior to admission HF Medications: Furosemide 60 mg BID Carvedilol 25 mg BID Entresto 97/103 mg BID Hydralazine 50 mg BID  *Has not tolerated Bidil in the past due to headaches  Pertinent Lab Values: . Serum creatinine 1.15, BUN 20, Potassium 4.1, Sodium 139, BNP 1013, Magnesium 1.3  Vital Signs: . Weight: 139 lbs (estimated dry weight: 140-145 lbs) . Blood pressure: 100/70s . Heart rate: 70s  Medication Assistance / Insurance Benefits Check: Does the patient have prescription insurance?   yes Type of insurance plan: [place] Medicaid  Does the patient qualify for medication assistance through manufacturers or grants? No  Eligible grants and/or patient assistance programs: None  Medication assistance applications in progress: None  Medication assistance applications approved: None  Approved medication assistance renewals will be completed by: Dr. Leonides Sake office  Outpatient Pharmacy:  Prior to admission outpatient pharmacy: [facility]; [facility] Is the patient willing to use [facility] pharmacy at discharge? Yes - although TOC closed for the weekend when discharge orders placed Is the patient willing to transition their outpatient pharmacy to utilize a [facility] outpatient pharmacy? Yes - will contact [facility] to organize transfer of medications    Assessment: 1. Acute on chronic systolic and diastolic CHF (EF 53-66%), due to polysubstance abuse  and non-compliance. NYHA class III symptoms. Not volume overloaded on MD exam. - No further furosemide - Restart carvedilol once volume status stable and BP improves - Restart Entresto as able pending BP improvement - Consider adding spironolactone prior to discharge - Consider SGLT2i at discharge   Plan: 1) Medication changes recommended at this time: - Restart Entresto at discharge - Restart carvedilol at discharge  2) Patient assistance application(s): - None - Patient is willing to transfer medications to [facility] - Marcelline Deist is on the Medicaid preferred medication list and the copay would be $3 per month  3)  Education  - Patient has been educated on current HF medications: Entresto, carvedilol, furosemide, hydralazine -Patient verbalizes understanding that over the next few months, these medication doses may change and more medications may be added to optimize HF regimen -Patient has been educated on basic disease state pathophysiology and goals of therapy -Time spent ( )   Danae Orleans, PharmD, BCPS Heart Failure Stewardship Pharmacist Phone [phone] 06/03/2019       9:38 AM

## 2019-06-03 NOTE — Interval H&P Note (Signed)
Cath Lab Visit (complete for each Cath Lab visit)  Clinical Evaluation Leading to the Procedure:   ACS: No.  Non-ACS:    Anginal Classification: No Symptoms  Anti-ischemic medical therapy: No Therapy  Non-Invasive Test Results: No non-invasive testing performed  Prior CABG: No previous CABG      History and Physical Interval Note:  06/03/2019 3:17 PM  [name]  has presented today for surgery, with the diagnosis of heart failure.  The various methods of treatment have been discussed with the patient and family. After consideration of risks, benefits and other options for treatment, the patient has consented to  Procedure(s): RIGHT HEART CATH (N/A) as a surgical intervention.  The patient's history has been reviewed, patient examined, no change in status, stable for surgery.  I have reviewed the patient's chart and labs.  Questions were answered to the patient's satisfaction.     Lyn Records III

## 2019-06-03 NOTE — Progress Notes (Addendum)
Progress Note  Patient Name: [name] Date of Encounter: 06/03/2019  Primary Cardiologist: Chilton Si, MD   Subjective   Breathing back to baseline.  No chest pain.  Inpatient Medications    Scheduled Meds: . cyclobenzaprine  10 mg Oral QHS  . folic acid  1 mg Oral Daily  . loratadine  10 mg Oral Daily  . magnesium oxide  400 mg Oral Daily  . multivitamin with minerals  1 tablet Oral Daily  . nicotine  14 mg Transdermal Daily  . pantoprazole (PROTONIX) IV  40 mg Intravenous Q12H  . thiamine  100 mg Oral Daily  . umeclidinium bromide  1 puff Inhalation Daily   Continuous Infusions: . magnesium sulfate bolus IVPB     PRN Meds: albuterol, ondansetron **OR** ondansetron (ZOFRAN) IV   Vital Signs    Vitals:   06/02/19 1400 06/02/19 2043 06/03/19 0648 06/03/19 0650  BP:  95/71 102/75   Pulse:  82 84   Resp: 18 18 18    Temp: 98 F (36.7 C) 98.1 F (36.7 C) 98.6 F (37 C)   TempSrc: Oral Oral Oral   SpO2: 100% 100% 100%   Weight:    63.2 kg  Height:        Intake/Output Summary (Last 24 hours) at 06/03/2019 0945 Last data filed at 06/03/2019 0651 Gross per 24 hour  Intake 150 ml  Output 725 ml  Net -575 ml   Last 3 Weights 06/03/2019 06/02/2019 06/01/2019  Weight (lbs) 139 lb 6.4 oz 141 lb 15.6 oz 137 lb 4.8 oz  Weight (kg) 63.231 kg 64.4 kg 62.279 kg      Telemetry    Normal sinus rhythm- Personally Reviewed  ECG    Not applicable  Physical Exam   GEN: No acute distress.   Neck: No JVD Cardiac: RRR, no murmurs, rubs, or gallops.  Respiratory: Clear to auscultation bilaterally. GI: Soft, nontender, not as distended as  yesterday MS: No edema; No deformity. Neuro:  Nonfocal  Psych: Normal affect   Labs    High Sensitivity Troponin:   Recent Labs  Lab 06/01/19 1118 06/01/19 1344  TROPONINIHS 30* 30*      Chemistry Recent Labs  Lab 06/01/19 1118 06/01/19 1118 06/02/19 0857 06/02/19 1630 06/03/19 0542  NA 137   < > 138  137 139  K 3.7   < > 3.0* 3.5 4.1  CL 95*   < > 97* 98 105  CO2 25   < > 29 27 26   GLUCOSE 91   < > 84 84 89  BUN 18   < > 25* 24* 20  CREATININE 1.61*   < > 1.35* 1.29* 1.15  CALCIUM 10.1   < > 9.6 9.1 9.3  PROT 7.3  --  6.4*  --   --   ALBUMIN 4.1  --  3.9  --   --   AST 44*  --  25  --   --   ALT 24  --  18  --   --   ALKPHOS 61  --  47  --   --   BILITOT 1.8*  --  1.4*  --   --   GFRNONAA 48*   < > 60* >60 >60  GFRAA 56*   < > >60 >60 >60  ANIONGAP 17*   < > 12 12 8    < > = values in this interval not displayed.     Hematology Recent Labs  Lab 06/01/19 1118 06/02/19 0857 06/03/19 0542  WBC 8.2 8.7 5.4  RBC 4.48 4.05* 3.75*  HGB 15.7 14.3 13.0  HCT 45.4 41.3 38.6*  MCV 101.3* 102.0* 102.9*  MCH 35.0* 35.3* 34.7*  MCHC 34.6 34.6 33.7  RDW 12.7 12.8 12.7  PLT 196 155 134*    BNP Recent Labs  Lab 06/01/19 1344  BNP 1,013.0*     DDimer  Recent Labs  Lab 06/01/19 1118  DDIMER 0.42     Radiology    DG Chest Port 1 View  Result Date: 06/01/2019 CLINICAL DATA:  Chest pain and shortness of breath. EXAM: PORTABLE CHEST 1 VIEW COMPARISON:  Chest x-ray dated April 11, 2018. FINDINGS: Unchanged left chest wall pacemaker. The heart remains at the upper limits of normal in size. Normal mediastinal contours. Normal pulmonary vascularity. No focal consolidation, pleural effusion, or pneumothorax. No acute osseous abnormality. IMPRESSION: No active disease. Electronically Signed   By: Titus Dubin M.D.   On: 06/01/2019 11:51   US Abdomen Limited RUQ  Result Date: 06/02/2019 CLINICAL DATA:  Hematemesis. EXAM: ULTRASOUND ABDOMEN LIMITED RIGHT UPPER QUADRANT COMPARISON:  03/22/2014 FINDINGS: Gallbladder: No gallstones or wall thickening visualized. No sonographic Murphy sign noted by sonographer. Common bile duct: Diameter: 4 mm Liver: No focal lesion identified. Within normal limits in parenchymal echogenicity. Portal vein is patent on color Doppler imaging with normal  direction of blood flow towards the liver. IMPRESSION: Negative right upper quadrant ultrasound. Electronically Signed   By: Monte Fantasia M.D.   On: 06/02/2019 11:38    Cardiac Studies   None  Patient Profile     54 y.o. 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  Assessment & Plan    1. Acute on Chronic combined CHF in setting of non compliance - BNP 1000. CXR clear .  His heart failure regimen held secondary to soft blood pressure. -Not much urine output.  However symptoms improved.  Abdominal distention improved.  Weight down 5 lb.  -Continue IV diuresis>> likely right heart cath later today and diuretic adjustment based on findings. - RHC will helps to guide adding his Entresto and Coreg  2. 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 - Meds as above - Last echo 10/2017 showed improved LVEF to 20-25% and grade 1 DD - AS above  3. AKI - resolved with diuresis  4.  Hypomagnesemia -We will supplement  Otherwise per primary team  For questions or updates, please contact [place] Please consult [email or web address] for contact info under        Signed, Leanor Kail, PA  06/03/2019, 9:45 AM    ATTENDING ATTESTATION  I have seen, examined and evaluated the patient this PM along with Mr. Curly Shores.  After reviewing all the available data and chart, we discussed the patients laboratory, study & physical findings as well as symptoms in detail. I agree with his findings, examination as well as impression recommendations as per our discussion.    He actually looks great this PM - abdominal pain better - less bloated.   Interestingly, his BP remains low & is not on any of his CHF meds -- will restart carvedilol @ 6.25 mg BID, but with BP in low 100s, unable to restart entresto -- will need close f/u in OP to reasess.    Pan RHC today to  assess fluid status.  - Pending results, may  be OK to d/c in PM or early tomorrow.  - will need TOC f/u next week to reassess BP & address CHF meds.  Has appt w/ [name] in mid June, but needs sooner f/u.  More to follow post RHC.     ADDENDUM:   Right heart pressures are normal.  Wedge pressure is less than 10.  Cardiac output 4.25 L/min      Based on these findings, he has peripherally well diuresed.  I think he is ready for discharge from a cardiac standpoint.    [facility] will sign off.   Medication Recommendations:  We will simply put him back on his home dose of oral Lasix. With his borderline blood pressures would have him take one half of his Entresto tablets twice daily until seen in follow-up Would continue with the 6.25 mg twice daily carvedilol.  Other recommendations (labs, testing, etc): None Follow up as an outpatient: He has an appointment on June 24, 2019 with [name].   Bryan Lemma, M.D., M.S. Interventional Cardiologist   Pager # [phone] Phone # [phone] [address] [place], [place] [address]

## 2019-06-03 NOTE — Discharge Instructions (Signed)
Heart Failure, Diagnosis  Heart failure means that your heart is not able to pump blood in the right way. This makes it hard for your body to work well. Heart failure is usually a long-term (chronic) condition. You must take good care of yourself and follow your treatment plan from your doctor. What are the causes? This condition may be caused by:  High blood pressure.  Build up of cholesterol and fat in the arteries.  Heart attack. This injures the heart muscle.  Heart valves that do not open and close properly.  Damage of the heart muscle. This is also called cardiomyopathy.  Lung disease.  Abnormal heart rhythms. What increases the risk? The risk of heart failure goes up as a person ages. This condition is also more likely to develop in people who:  Are overweight.  Are male.  Smoke or chew tobacco.  Abuse alcohol or illegal drugs.  Have taken medicines that can damage the heart.  Have diabetes.  Have abnormal heart rhythms.  Have thyroid problems.  Have low blood counts (anemia). What are the signs or symptoms? Symptoms of this condition include:  Shortness of breath.  Coughing.  Swelling of the feet, ankles, legs, or belly.  Losing weight for no reason.  Trouble breathing.  Waking from sleep because of the need to sit up and get more air.  Rapid heartbeat.  Being very tired.  Feeling dizzy, or feeling like you may pass out (faint).  Having no desire to eat.  Feeling like you may vomit (nauseous).  Peeing (urinating) more at night.  Feeling confused. How is this treated?     This condition may be treated with:  Medicines. These can be given to treat blood pressure and to make the heart muscles stronger.  Changes in your daily life. These may include eating a healthy diet, staying at a healthy body weight, quitting tobacco and illegal drug use, or doing exercises.  Surgery. Surgery can be done to open blocked valves, or to put devices in  the heart, such as pacemakers.  A donor heart (heart transplant). You will receive a healthy heart from a donor. Follow these instructions at home:  Treat other conditions as told by your doctor. These may include high blood pressure, diabetes, thyroid disease, or abnormal heart rhythms.  Learn as much as you can about heart failure.  Get support as you need it.  Keep all follow-up visits as told by your doctor. This is important. Summary  Heart failure means that your heart is not able to pump blood in the right way.  This condition is caused by high blood pressure, heart attack, or damage of the heart muscle.  Symptoms of this condition include shortness of breath and swelling of the feet, ankles, legs, or belly. You may also feel very tired or feel like you may vomit.  You may be treated with medicines, surgery, or changes in your daily life.  Treat other health conditions as told by your doctor. This information is not intended to replace advice given to you by your health care provider. Make sure you discuss any questions you have with your health care provider. Document Revised: 03/26/2018 Document Reviewed: 03/26/2018 Elsevier Patient Education  2020 [employer] [name],   It has been a pleasure working with you and we are glad you're feeling better. You were hospitalized for volume overload from your heart failure. You were treated with diuretics which improved your symptoms. Cardiology was following and performed  a procedure on your heart and it looks like it's functioning the same as it was the last time this was checked.    Please make the following adjustments,  Decrease your Coreg to 6.25 mg twice a day Decrease your entresto to 1/2 tab twice a day Continue taking your Lasix and your other medications as prescribed.   Please follow up with your primary care. You will need to see a gastroenterologist(stomach doctor) to discuss the bleeding that you have been  having.    Follow up with your primary care provider in 1-2 weeks  If your symptoms worsen or you develop new symptoms, please seek medical help whether it is your primary care provider or emergency department.

## 2019-06-03 NOTE — H&P (View-Only) (Signed)
Progress Note  Patient Name: [name] Date of Encounter: 06/03/2019  Primary Cardiologist: Chilton Si, MD   Subjective   Breathing back to baseline.  No chest pain.  Inpatient Medications    Scheduled Meds: . cyclobenzaprine  10 mg Oral QHS  . folic acid  1 mg Oral Daily  . loratadine  10 mg Oral Daily  . magnesium oxide  400 mg Oral Daily  . multivitamin with minerals  1 tablet Oral Daily  . nicotine  14 mg Transdermal Daily  . pantoprazole (PROTONIX) IV  40 mg Intravenous Q12H  . thiamine  100 mg Oral Daily  . umeclidinium bromide  1 puff Inhalation Daily   Continuous Infusions: . magnesium sulfate bolus IVPB     PRN Meds: albuterol, ondansetron **OR** ondansetron (ZOFRAN) IV   Vital Signs    Vitals:   06/02/19 1400 06/02/19 2043 06/03/19 0648 06/03/19 0650  BP:  95/71 102/75   Pulse:  82 84   Resp: 18 18 18    Temp: 98 F (36.7 C) 98.1 F (36.7 C) 98.6 F (37 C)   TempSrc: Oral Oral Oral   SpO2: 100% 100% 100%   Weight:    63.2 kg  Height:        Intake/Output Summary (Last 24 hours) at 06/03/2019 0945 Last data filed at 06/03/2019 0651 Gross per 24 hour  Intake 150 ml  Output 725 ml  Net -575 ml   Last 3 Weights 06/03/2019 06/02/2019 06/01/2019  Weight (lbs) 139 lb 6.4 oz 141 lb 15.6 oz 137 lb 4.8 oz  Weight (kg) 63.231 kg 64.4 kg 62.279 kg      Telemetry    Normal sinus rhythm- Personally Reviewed  ECG    Not applicable  Physical Exam   GEN: No acute distress.   Neck: No JVD Cardiac: RRR, no murmurs, rubs, or gallops.  Respiratory: Clear to auscultation bilaterally. GI: Soft, nontender, not as distended as  yesterday MS: No edema; No deformity. Neuro:  Nonfocal  Psych: Normal affect   Labs    High Sensitivity Troponin:   Recent Labs  Lab 06/01/19 1118 06/01/19 1344  TROPONINIHS 30* 30*      Chemistry Recent Labs  Lab 06/01/19 1118 06/01/19 1118 06/02/19 0857 06/02/19 1630 06/03/19 0542  NA 137   < > 138  137 139  K 3.7   < > 3.0* 3.5 4.1  CL 95*   < > 97* 98 105  CO2 25   < > 29 27 26   GLUCOSE 91   < > 84 84 89  BUN 18   < > 25* 24* 20  CREATININE 1.61*   < > 1.35* 1.29* 1.15  CALCIUM 10.1   < > 9.6 9.1 9.3  PROT 7.3  --  6.4*  --   --   ALBUMIN 4.1  --  3.9  --   --   AST 44*  --  25  --   --   ALT 24  --  18  --   --   ALKPHOS 61  --  47  --   --   BILITOT 1.8*  --  1.4*  --   --   GFRNONAA 48*   < > 60* >60 >60  GFRAA 56*   < > >60 >60 >60  ANIONGAP 17*   < > 12 12 8    < > = values in this interval not displayed.     Hematology Recent Labs  Lab 06/01/19 1118 06/02/19 0857 06/03/19 0542  WBC 8.2 8.7 5.4  RBC 4.48 4.05* 3.75*  HGB 15.7 14.3 13.0  HCT 45.4 41.3 38.6*  MCV 101.3* 102.0* 102.9*  MCH 35.0* 35.3* 34.7*  MCHC 34.6 34.6 33.7  RDW 12.7 12.8 12.7  PLT 196 155 134*    BNP Recent Labs  Lab 06/01/19 1344  BNP 1,013.0*     DDimer  Recent Labs  Lab 06/01/19 1118  DDIMER 0.42     Radiology    DG Chest Port 1 View  Result Date: 06/01/2019 CLINICAL DATA:  Chest pain and shortness of breath. EXAM: PORTABLE CHEST 1 VIEW COMPARISON:  Chest x-ray dated April 11, 2018. FINDINGS: Unchanged left chest wall pacemaker. The heart remains at the upper limits of normal in size. Normal mediastinal contours. Normal pulmonary vascularity. No focal consolidation, pleural effusion, or pneumothorax. No acute osseous abnormality. IMPRESSION: No active disease. Electronically Signed   By: Titus Dubin M.D.   On: 06/01/2019 11:51   US Abdomen Limited RUQ  Result Date: 06/02/2019 CLINICAL DATA:  Hematemesis. EXAM: ULTRASOUND ABDOMEN LIMITED RIGHT UPPER QUADRANT COMPARISON:  03/22/2014 FINDINGS: Gallbladder: No gallstones or wall thickening visualized. No sonographic Murphy sign noted by sonographer. Common bile duct: Diameter: 4 mm Liver: No focal lesion identified. Within normal limits in parenchymal echogenicity. Portal vein is patent on color Doppler imaging with normal  direction of blood flow towards the liver. IMPRESSION: Negative right upper quadrant ultrasound. Electronically Signed   By: Monte Fantasia M.D.   On: 06/02/2019 11:38    Cardiac Studies   None  Patient Profile     54 y.o. 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  Assessment & Plan    1. Acute on Chronic combined CHF in setting of non compliance - BNP 1000. CXR clear .  His heart failure regimen held secondary to soft blood pressure. -Not much urine output.  However symptoms improved.  Abdominal distention improved.  Weight down 5 lb.  -Continue IV diuresis>> likely right heart cath later today and diuretic adjustment based on findings. - RHC will helps to guide adding his Entresto and Coreg  2. 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 - Meds as above - Last echo 10/2017 showed improved LVEF to 20-25% and grade 1 DD - AS above  3. AKI - resolved with diuresis  4.  Hypomagnesemia -We will supplement  Otherwise per primary team  For questions or updates, please contact [place] Please consult [email or web address] for contact info under        Signed, Leanor Kail, PA  06/03/2019, 9:45 AM    ATTENDING ATTESTATION  I have seen, examined and evaluated the patient this PM along with Mr. Curly Shores.  After reviewing all the available data and chart, we discussed the patients laboratory, study & physical findings as well as symptoms in detail. I agree with his findings, examination as well as impression recommendations as per our discussion.    He actually looks great this PM - abdominal pain better - less bloated.   Interestingly, his BP remains low & is not on any of his CHF meds -- will restart carvedilol @ 6.25 mg BID, but with BP in low 100s, unable to restart entresto -- will need close f/u in OP to reasess.    Pan RHC today to  assess fluid status.  - Pending results, may  be OK to d/c in PM or early tomorrow.  - will need TOC f/u next week to reassess BP & address CHF meds.  Has appt w/ Dr. Oval Linsey in mid June, but needs sooner f/u.  More to follow post RHC.      Glenetta Hew, M.D., M.S. Interventional Cardiologist   Pager # [phone] Phone # [phone] [address] [place], [place] [address]

## 2019-06-03 NOTE — CV Procedure (Signed)
   Right heart cath via previously established right antecubital Angiocath exchanged out for 5/6 sheath.  Right heart pressures are normal.  Wedge pressure is less than 10.  Cardiac output 4.25 L/min

## 2019-06-06 ENCOUNTER — Telehealth: Payer: Self-pay

## 2019-06-06 NOTE — Telephone Encounter (Signed)
Transition Care Management Follow-up Telephone Call  Date of discharge and from where: 06/03/2019, [facility]   How have you been since you were released from the hospital? No complaints reported   Any questions or concerns? he explained that he has to spend the weekends in jail ( Friday-Sunday) as part of his probation.  When he is in the jail they will not allow him to take his medications  He is hoping that a letter from the provider would allow him to take his medications or allow him to do community service in lieu of jail time. Instructed him to bring his probation officer's contact information to the clinic with him tomorrow and this CM will contact the probation officer to discuss possible options.   He receives disability.    Items Reviewed:  Did the pt receive and understand the discharge instructions provided?yes, he said he has them   Medications obtained and verified? does not have all medications,  Needs to pick up protonix. Did not have med list with him at the time of the call.  Instructed him to bring his medications with him to his appt tomorrow,  Any new allergies since your discharge?  none reported  Do you have support at home?  lives alone.  Has a son in the area but he does not provide much assistance.   Other (ie: DME, Home Health, etc) no DME or home health ordered.   Doesn't have a scale.   Functional Questionnaire: (I = Independent and D = Dependent) ADL's:independent   Follow up appointments reviewed:    PCP Hospital f/u appt confirmed?.Dr Delford Field 06/07/2019 @ 1500  Specialist Hospital f/u appt confirmed? .cardiology - 06/24/2019  Are transportation arrangements needed?  no  If their condition worsens, is the pt aware to call  their PCP or go to the ED? yes  Was the patient provided with contact information for the PCP's office or ED? he has the phone number on the AVS  Was the pt encouraged to call back with questions or concerns?  yes and  reminded him of his appt tomorrow at [facility].  Reviewed the clinic address

## 2019-06-07 ENCOUNTER — Other Ambulatory Visit: Payer: Self-pay

## 2019-06-07 ENCOUNTER — Ambulatory Visit: Payer: Medicaid Other | Attending: Critical Care Medicine | Admitting: Critical Care Medicine

## 2019-06-07 ENCOUNTER — Telehealth: Payer: Self-pay

## 2019-06-07 ENCOUNTER — Encounter: Payer: Self-pay | Admitting: Critical Care Medicine

## 2019-06-07 VITALS — BP 124/83 | HR 84 | Temp 97.7°F | Ht 68.0 in | Wt 150.8 lb

## 2019-06-07 DIAGNOSIS — Z8673 Personal history of transient ischemic attack (TIA), and cerebral infarction without residual deficits: Secondary | ICD-10-CM | POA: Insufficient documentation

## 2019-06-07 DIAGNOSIS — Z79899 Other long term (current) drug therapy: Secondary | ICD-10-CM | POA: Diagnosis not present

## 2019-06-07 DIAGNOSIS — Z8249 Family history of ischemic heart disease and other diseases of the circulatory system: Secondary | ICD-10-CM | POA: Diagnosis not present

## 2019-06-07 DIAGNOSIS — Z95810 Presence of automatic (implantable) cardiac defibrillator: Secondary | ICD-10-CM | POA: Diagnosis not present

## 2019-06-07 DIAGNOSIS — Z823 Family history of stroke: Secondary | ICD-10-CM | POA: Diagnosis not present

## 2019-06-07 DIAGNOSIS — I5043 Acute on chronic combined systolic (congestive) and diastolic (congestive) heart failure: Secondary | ICD-10-CM

## 2019-06-07 DIAGNOSIS — I5020 Unspecified systolic (congestive) heart failure: Secondary | ICD-10-CM | POA: Diagnosis not present

## 2019-06-07 DIAGNOSIS — Z8349 Family history of other endocrine, nutritional and metabolic diseases: Secondary | ICD-10-CM | POA: Diagnosis not present

## 2019-06-07 DIAGNOSIS — I110 Hypertensive heart disease with heart failure: Secondary | ICD-10-CM | POA: Insufficient documentation

## 2019-06-07 DIAGNOSIS — F17210 Nicotine dependence, cigarettes, uncomplicated: Secondary | ICD-10-CM | POA: Diagnosis not present

## 2019-06-07 DIAGNOSIS — F1010 Alcohol abuse, uncomplicated: Secondary | ICD-10-CM | POA: Diagnosis not present

## 2019-06-07 DIAGNOSIS — Z880 Allergy status to penicillin: Secondary | ICD-10-CM | POA: Insufficient documentation

## 2019-06-07 DIAGNOSIS — I428 Other cardiomyopathies: Secondary | ICD-10-CM | POA: Diagnosis not present

## 2019-06-07 DIAGNOSIS — Z7982 Long term (current) use of aspirin: Secondary | ICD-10-CM | POA: Insufficient documentation

## 2019-06-07 DIAGNOSIS — I10 Essential (primary) hypertension: Secondary | ICD-10-CM

## 2019-06-07 DIAGNOSIS — I429 Cardiomyopathy, unspecified: Secondary | ICD-10-CM | POA: Insufficient documentation

## 2019-06-07 DIAGNOSIS — J438 Other emphysema: Secondary | ICD-10-CM

## 2019-06-07 DIAGNOSIS — I426 Alcoholic cardiomyopathy: Secondary | ICD-10-CM | POA: Diagnosis not present

## 2019-06-07 DIAGNOSIS — Z720 Tobacco use: Secondary | ICD-10-CM

## 2019-06-07 DIAGNOSIS — I509 Heart failure, unspecified: Secondary | ICD-10-CM | POA: Diagnosis present

## 2019-06-07 MED ORDER — FUROSEMIDE 40 MG PO TABS: 40.0000 mg | ORAL_TABLET | Freq: Two times a day (BID) | ORAL | 3 refills | Status: DC

## 2019-06-07 MED ORDER — CARVEDILOL 25 MG PO TABS: 12.5000 mg | ORAL_TABLET | Freq: Two times a day (BID) | ORAL | 2 refills | Status: DC

## 2019-06-07 MED ORDER — HYDRALAZINE HCL 50 MG PO TABS
50.0000 mg | ORAL_TABLET | Freq: Two times a day (BID) | ORAL | 6 refills | Status: DC
Start: 2019-06-07 — End: 2019-06-17

## 2019-06-07 MED ORDER — PANTOPRAZOLE SODIUM 40 MG PO TBEC: 40.0000 mg | DELAYED_RELEASE_TABLET | Freq: Every day | ORAL | 6 refills | Status: DC

## 2019-06-07 NOTE — Assessment & Plan Note (Signed)
Ongoing tobacco use 

## 2019-06-07 NOTE — Assessment & Plan Note (Signed)
History of accelerated hypertension  Blood pressure today under improved control  I had undergo a 20-minute medication reconciliation with this patient.  I have determined the current medications are what he should be prescribed till he sees cardiology for further medication reconciliation  He needs to be on Coreg at all 12-1/2 mg twice daily which is a one half 25 mg tablet He needs to be on furosemide 40 mg twice daily  He needs to continue hydralazine 50 mg twice daily he was not taking this   He needs to reduce his Entresto to a 1/2 tablet of a 97-1 03 dosage twice daily He will continue his dietary restrictions

## 2019-06-07 NOTE — Patient Instructions (Addendum)
Labs today blood count and chemistry profile  Take coreg /carividol 1/2 25 mg tablet twice daily  Reduce lasix to 40mg  twice daily  Take 1/2 Entresto tablet twice daily  Take hydralazine one tablet three times daily  Refills being sent to new pharmacy for delivery to your home [facility]  Keep cardiology appointment  Return Dr 1 month

## 2019-06-07 NOTE — Assessment & Plan Note (Signed)
Nonischemic cardiomyopathy with preserved ejection fraction

## 2019-06-07 NOTE — Assessment & Plan Note (Signed)
Significant alcohol and history of cocaine use as well will need further evaluation at a later date

## 2019-06-07 NOTE — Assessment & Plan Note (Signed)
Continue inhaled medicines as prescribed 

## 2019-06-07 NOTE — Assessment & Plan Note (Signed)
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  Patient will continue treatment for his combined systolic and diastolic heart failure and follow-up with the advanced heart failure clinic note he does have an defibrillator in place

## 2019-06-07 NOTE — Assessment & Plan Note (Signed)
Recent exacerbation of heart failure due to medication nonadherence while in jail

## 2019-06-07 NOTE — Telephone Encounter (Signed)
Met with the patient when he was in the clinic today. He provided his probation officer's contact information [employer] # [phone].  This CM placed call to Officer Ashland and left message with call back requested to this CM.   This CM then spoke to [employer] and explained the importance of patient receiving all of his medications when he is in jail.  He can't miss 2 days of medications. She said that the medical area is always full.  She provided this CM with the phone number for the jail to call the medical area to discuss further.   This CM inquired about the patient performing community service in lieu of jail time.  She said that she will need to take the patient back to court and talk to the judge and she agreed to follow through with this option.  This CM again stressed the need for the patient to take his medications as prescribed and not miss any doses. Missing doses of his medications is detrimental to his health.    Call placed to [facility] jail  - spoke to [place], [place] # [phone].  Explained the patient's complaint of having his medications taken from him when he comes to the jail on Fridays and he is not allowed medications for the 2 days that he is there. She explained that they do give medications there and the medical area is not too full for a person to receive medications. She requested that a medication list  be faxed to her for their provider to review - fax # [phone].   She explained that they patient can bring his medications with him to the jail and let the officer and nurse know that he has his medications when he is entering the building. She said that some medications they will give him from their stock and the others he can bring in and they will hold them and dispense them to him.  This CM reviewed his current med list with Coralyn Mark.  She explained that he can keep his inhalers on his person . They have his strength of carvedilol, certirizine, furosemide  and hydralazine and can dispense from their stock.   He needs to bring his flexeril, entresto and pantoprazole and they will dispense to him as directed.

## 2019-06-07 NOTE — Progress Notes (Signed)
Subjective:    Patient ID: [name], male    DOB: 07-12-1965, 54 y.o.   MRN: [id number]  54 y.o.M with CHF HTNive heart dz, CVA, ETOH CM, NICM TCM viist post hosp  This patient's been in the criminal justice system and had to go to the jail on weekends and has a history of hypertension longstanding with nonischemic cardiomyopathy. The patient is a heavy drinker. Patient is also noted blood in the stool and emesis of blood. The patient was admitted between the 12th and 14 May and this is a post hospital transition of care visit. Below is the discharge summary.  Dc summary Date of Admission: 06/01/2019 11:03 AM Date of Discharge: 06/03/2019 Attending Physician: Dr. Debe Coder  Discharge Diagnosis: 1. Acute on chronic combined heart failure in setting of non-compliance 2. NICM s/p ICD 3. AKI 4. Hematemesis/hematochezia  Discharge Medications: Allergies as of 06/03/2019     Reactions  Penicillins Other (See Comments)  Blisters  Has patient had a PCN reaction causing immediate rash, facial/tongue/throat swelling, SOB or lightheadedness with hypotension: Yes Has patient had a PCN reaction causing severe rash involving mucus membranes or skin necrosis: Yes Has patient had a PCN reaction that required hospitalization: No Has patient had a PCN reaction occurring within the last 10 years: No If all of the above answers are "NO", then may proceed with Cephalosporin use.   Medication List  STOP taking these medications  magnesium oxide 400 MG tablet Commonly known as: MAG-OX  multivitamin with minerals Tabs tablet   TAKE these medications  aspirin EC 81 MG tablet Take 81 mg by mouth daily.  carvedilol 6.25 MG tablet Commonly known as: COREG Take 1 tablet (6.25 mg total) by mouth 2 (two) times daily with a meal. What changed:   medication strength  how much to take  when to take this  cetirizine 10 MG tablet Commonly known as: ZYRTEC Take 1 tablet  (10 mg total) by mouth daily.  cyclobenzaprine 10 MG tablet Commonly known as: FLEXERIL Take 1 tablet (10 mg total) by mouth at bedtime. What changed:   when to take this  reasons to take this  Entresto 97-103 MG Generic drug: sacubitril-valsartan Take 0.5 tablets by mouth 2 (two) times daily. What changed: how much to take  furosemide 40 MG tablet Commonly known as: LASIX Take 60 mg by mouth 2 (two) times daily.  hydrALAZINE 50 MG tablet Commonly known as: APRESOLINE Take 1 tablet (50 mg total) by mouth 2 (two) times a day.  pantoprazole 40 MG tablet Commonly known as: PROTONIX Take 1 tablet (40 mg total) by mouth daily.  ProAir HFA 108 (90 Base) MCG/ACT inhaler Generic drug: albuterol INHALE 2 PUFFS INTO THE LUNGS EVERY 6 (SIX) HOURS AS NEEDED FOR WHEEZING OR SHORTNESS OF BREATH. What changed: See the new instructions.  tiotropium 18 MCG inhalation capsule Commonly known as: Spiriva HandiHaler Place 1 capsule (18 mcg total) into inhaler and inhale daily.  triamcinolone cream 0.1 % Commonly known as: KENALOG Apply 1 application topically 2 (two) times daily. What changed:   when to take this  reasons to take this     Disposition and follow-up:   [name] was discharged from [facility] in Stable condition.  At the hospital follow up visit please address:  1. NICM s/p ICD. Acute on chronic combined heart failure in setting of non-compliance. Pt noted that he is unable to take his heart failure medications during his weekend  stays in jail. Unclear if this is the whole picture however encouraged him to continue to take as directed by cardiology.  Underwent diuresis. Underwent RHC on day of discharge which showed normal right heart pressures. Wedge pressure <93mmHg. CO 4.25L/min. -Carvedilol continued at discharge however cardiology is wanting to hold entresto for now given his SBPs being in the low 100s. -Will need close  outpatient follow up with cardiology for further evaluation of BP/CHF regimen  2. AKI. Likely cardiorenal. Resolved with diuresis.  3. Hematemesis/hematochezia. Pt had reported this on admission. He noted 2 episodes of dark red blood in his stools that occurred last week and had 2 episodes of dark red emesis a few days ago.  Given his history of alcohol use, I obtained a RUQ [name] to evaluate for possible cirrhosis>varicies. RUQ [name] was normal. Due to being in an acute heart failure exacerbation, we did not consult GI during admission, as he did not have any further episodes and hgb was 15 on admission indicating that it was unlikely this was a major bleed. -obtain a repeat CBC at follow up to further evaluate -GI referral for colonoscopy/EGD  2.  Labs / imaging needed at time of follow-up: CBC, BMP  3.  Pending labs/ test needing follow-up: none  Below is the nursing transition care note NURSE TCM NOTE:    Transition Care Management Follow-up Telephone Call  Date of discharge and from where: 06/03/2019, [facility]   How have you been since you were released from the hospital? No complaints reported   Any questions or concerns? he explained that he has to spend the weekends in jail ( Friday-Sunday) as part of his probation.  When he is in the jail they will not allow him to take his medications  He is hoping that a letter from the provider would allow him to take his medications or allow him to do community service in lieu of jail time. Instructed him to bring his probation officer's contact information to the clinic with him tomorrow and this CM will contact the probation officer to discuss possible options.   He receives disability.    Items Reviewed:  Did the pt receive and understand the discharge instructions provided?yes, he said he has them   Medications obtained and verified? does not have all medications,  Needs to pick up protonix. Did not have med list with him  at the time of the call.  Instructed him to bring his medications with him to his appt tomorrow,  Any new allergies since your discharge?  none reported  Do you have support at home?  lives alone.  Has a son in the area but he does not provide much assistance.   Other (ie: DME, Home Health, etc) no DME or home health ordered.   Doesn't have a scale.   Functional Questionnaire: (I = Independent and D = Dependent) ADL's:independent   Follow up appointments reviewed:    PCP Hospital f/u appt confirmed?.Dr Delford Field 06/07/2019 @ 1500  Specialist Hospital f/u appt confirmed? .cardiology - 06/24/2019  Are transportation arrangements needed?  no  If their condition worsens, is the pt aware to call  their PCP or go to the ED? yes  Was the patient provided with contact information for the PCP's office or ED? he has the phone number on the AVS  Was the pt encouraged to call back with questions or concerns?  yes and reminded him of his appt tomorrow at [facility].  Reviewed the clinic  address  Patient comes in today accompanied with his significant other and is anxious because he is required to go to the jail over the weekend but upon discussing with the probation officer they often will put him in general population he will not have access to his meds and they will not prescribe meds from the jail. His probation does end in August Wt Readings from Last 3 Encounters: 06/07/19 : 150 lb 12.8 oz (68.4 kg) 06/03/19 : 139 lb 6.4 oz (63.2 kg) 10/04/18 : 145 lb 9.6 oz (66 kg)  The patient's discharge summary indicated medication reconciliation been done with the patient's current medicines he brings in with him today did not match the medication reconciliation  Patient states his dyspnea is improved compared to the hospitalization he is not having chest pain. He denies edema. He does note abdominal bloating.   Past Medical History:  Diagnosis Date  . CHF (congestive heart failure) (HCC)   .  Headache   . Hypertension   . Stroke [facility]) 02/24/2015     Family History  Problem Relation Age of Onset  . Hypertension Mother   . Hyperlipidemia Mother   . Hypertension Father   . Stroke Maternal Aunt   . Hypertension Sister   . Hypertension Brother      Social History   Socioeconomic History  . Marital status: Single    Spouse name: Not on file  . Number of children: 1  . Years of education: 27  . Highest education level: Not on file  Occupational History  . Not on file  Tobacco Use  . Smoking status: Current Every Day Smoker    Packs/day: 1.00    Years: 20.00    Pack years: 20.00    Types: Cigarettes  . Smokeless tobacco: Never Used  . Tobacco comment: 05/14/16 smoking 1 PPD  Substance and Sexual Activity  . Alcohol use: Yes    Alcohol/week: 1.0 standard drinks    Types: 1 Cans of beer per week    Comment: socially   . Drug use: Yes    Types: Cocaine    Comment: stopped using cocaine 11/19/14  . Sexual activity: Not on file  Other Topics Concern  . Not on file  Social History Narrative   Lives alone, nurse comes 2 hrs daily   Social Determinants of Health   Financial Resource Strain:   . Difficulty of Paying Living Expenses:   Food Insecurity:   . Worried About Programme researcher, broadcasting/film/video in the Last Year:   . Barista in the Last Year:   Transportation Needs:   . Freight forwarder (Medical):   Marland Kitchen Lack of Transportation (Non-Medical):   Physical Activity:   . Days of Exercise per Week:   . Minutes of Exercise per Session:   Stress:   . Feeling of Stress :   Social Connections:   . Frequency of Communication with Friends and Family:   . Frequency of Social Gatherings with Friends and Family:   . Attends Religious Services:   . Active Member of Clubs or Organizations:   . Attends Banker Meetings:   Marland Kitchen Marital Status:   Intimate Partner Violence:   . Fear of Current or Ex-Partner:   . Emotionally Abused:   Marland Kitchen Physically Abused:   .  Sexually Abused:      Allergies  Allergen Reactions  . Penicillins Other (See Comments)    Blisters  Has patient had a PCN reaction causing  immediate rash, facial/tongue/throat swelling, SOB or lightheadedness with hypotension: Yes Has patient had a PCN reaction causing severe rash involving mucus membranes or skin necrosis: Yes Has patient had a PCN reaction that required hospitalization: No Has patient had a PCN reaction occurring within the last 10 years: No If all of the above answers are "NO", then may proceed with Cephalosporin use.      Outpatient Medications Prior to Visit  Medication Sig Dispense Refill  . cetirizine (ZYRTEC) 10 MG tablet Take 1 tablet (10 mg total) by mouth daily. 30 tablet 1  . cyclobenzaprine (FLEXERIL) 10 MG tablet Take 1 tablet (10 mg total) by mouth at bedtime. (Patient taking differently: Take 10 mg by mouth at bedtime as needed for muscle spasms. ) 30 tablet 1  . PROAIR HFA 108 (90 Base) MCG/ACT inhaler INHALE 2 PUFFS INTO THE LUNGS EVERY 6 (SIX) HOURS AS NEEDED FOR WHEEZING OR SHORTNESS OF BREATH. (Patient taking differently: Inhale 2 puffs into the lungs every 6 (six) hours as needed for shortness of breath. ) 8.5 g 0  . sacubitril-valsartan (ENTRESTO) 97-103 MG Take 0.5 tablets by mouth 2 (two) times daily. 30 tablet 0  . tiotropium (SPIRIVA HANDIHALER) 18 MCG inhalation capsule Place 1 capsule (18 mcg total) into inhaler and inhale daily. 30 capsule 6  . triamcinolone cream (KENALOG) 0.1 % Apply 1 application topically 2 (two) times daily. (Patient taking differently: Apply 1 application topically daily as needed (rash/irritation). ) 80 g 1  . carvedilol (COREG) 6.25 MG tablet Take 1 tablet (6.25 mg total) by mouth 2 (two) times daily with a meal. 60 tablet 0  . furosemide (LASIX) 40 MG tablet Take 60 mg by mouth 2 (two) times daily.     Marland Kitchen aspirin EC 81 MG tablet Take 81 mg by mouth daily.    . hydrALAZINE (APRESOLINE) 50 MG tablet Take 1 tablet (50 mg  total) by mouth 2 (two) times a day. (Patient not taking: Reported on 06/07/2019) 60 tablet 6  . pantoprazole (PROTONIX) 40 MG tablet Take 1 tablet (40 mg total) by mouth daily. (Patient not taking: Reported on 06/07/2019) 30 tablet 0   No facility-administered medications prior to visit.      Review of Systems  Constitutional: Positive for activity change, appetite change and fatigue.  Respiratory: Positive for shortness of breath.   Cardiovascular: Positive for palpitations and leg swelling. Negative for chest pain.  Gastrointestinal: Positive for abdominal distention and blood in stool.  Psychiatric/Behavioral: Positive for behavioral problems and decreased concentration. Negative for self-injury, sleep disturbance and suicidal ideas. The patient is nervous/anxious and is hyperactive.        Objective:   Physical Exam Vitals:   06/07/19 1516  BP: 124/83  Pulse: 84  Temp: 97.7 F (36.5 C)  TempSrc: Temporal  SpO2: 97%  Weight: 150 lb 12.8 oz (68.4 kg)  Height: 5\' 8"  (1.727 m)    Gen: Pleasant, well-nourished, in no distress,  normal affect  ENT: No lesions,  mouth clear,  oropharynx clear, no postnasal drip  Neck: No JVD, no TMG, no carotid bruits  Lungs: No use of accessory muscles, no dullness to percussion, clear without rales or rhonchi  Cardiovascular: RRR, heart sounds normal, no murmur or gallops, no peripheral edema  Abdomen: soft and NT, no HSM,  BS normal  Musculoskeletal: No deformities, no cyanosis or clubbing  Neuro: alert, non focal  Skin: Warm, no lesions or rashes  All labs and x-rays were seen in the [facility]  health link system from recent admission       Assessment & Plan:  I personally reviewed all images and lab data in the [facility] system as well as any outside material available during this office visit and agree with the  radiology impressions.   Accelerated hypertension History of accelerated hypertension  Blood pressure today under improved  control  I had undergo a 20-minute medication reconciliation with this patient.  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  Patient will continue treatment for his combined systolic and diastolic heart failure and follow-up with the advanced heart failure clinic note he does have an defibrillator in place  HFrEF (heart failure with reduced ejection fraction) (HCC) Nonischemic cardiomyopathy with preserved ejection fraction  CHF exacerbation (HCC) Recent exacerbation of heart failure due to medication nonadherence while in jail  COPD (chronic obstructive pulmonary disease) (HCC) Continue inhaled medicines as prescribed  ETOH abuse Significant alcohol and history of cocaine use as well will need further evaluation at a later date  Tobacco abuse Ongoing tobacco use   Diagnoses and all orders for this visit:  NICM (nonischemic cardiomyopathy) (HCC) -     Comprehensive metabolic panel -     CBC with Differential/Platelet  Accelerated hypertension -     Comprehensive metabolic panel -     CBC with Differential/Platelet  Alcoholic cardiomyopathy (HCC)  HFrEF (heart failure with reduced ejection fraction) (HCC)  Acute on chronic combined systolic and diastolic congestive heart failure (HCC)  Other emphysema (HCC)  ETOH abuse  Tobacco abuse  Other orders -     carvedilol (COREG) 25 MG tablet; Take 0.5 tablets (12.5 mg total)  by mouth 2 (two) times daily with a meal. -     furosemide (LASIX) 40 MG tablet; Take 1 tablet (40 mg total) by mouth 2 (two) times daily. -     hydrALAZINE (APRESOLINE) 50 MG tablet; Take 1 tablet (50 mg total) by mouth in the morning and at bedtime. -     pantoprazole (PROTONIX) 40 MG tablet; Take 1 tablet (40 mg total) by mouth daily.   Refills on medications sent to [facility] for delivery to the home included Coreg at all, furosemide, hydralazine, and pantoprazole  Case management will send documentation of his medication needs to the criminal justice system so he is back in the prison system he will receive medications in the system

## 2019-06-07 NOTE — Progress Notes (Signed)
tcm

## 2019-06-08 ENCOUNTER — Telehealth: Payer: Self-pay

## 2019-06-08 DIAGNOSIS — J438 Other emphysema: Secondary | ICD-10-CM

## 2019-06-08 DIAGNOSIS — R252 Cramp and spasm: Secondary | ICD-10-CM

## 2019-06-08 DIAGNOSIS — R059 Cough, unspecified: Secondary | ICD-10-CM

## 2019-06-08 LAB — COMPREHENSIVE METABOLIC PANEL
ALT: 8 IU/L (ref 0–44)
AST: 15 IU/L (ref 0–40)
Albumin/Globulin Ratio: 2.2 (ref 1.2–2.2)
Albumin: 4.9 g/dL (ref 3.8–4.9)
Alkaline Phosphatase: 70 IU/L (ref 48–121)
BUN/Creatinine Ratio: 22 — ABNORMAL HIGH (ref 9–20)
BUN: 20 mg/dL (ref 6–24)
Bilirubin Total: 0.7 mg/dL (ref 0.0–1.2)
CO2: 23 mmol/L (ref 20–29)
Calcium: 9.8 mg/dL (ref 8.7–10.2)
Chloride: 105 mmol/L (ref 96–106)
Creatinine, Ser: 0.93 mg/dL (ref 0.76–1.27)
GFR calc Af Amer: 108 mL/min/{1.73_m2} (ref >59)
GFR calc non Af Amer: 93 mL/min/{1.73_m2} (ref >59)
Globulin, Total: 2.2 g/dL (ref 1.5–4.5)
Glucose: 69 mg/dL (ref 65–99)
Potassium: 4.7 mmol/L (ref 3.5–5.2)
Sodium: 142 mmol/L (ref 134–144)
Total Protein: 7.1 g/dL (ref 6.0–8.5)

## 2019-06-08 LAB — CBC WITH DIFFERENTIAL/PLATELET
Basophils Absolute: 0.1 10*3/uL (ref 0.0–0.2)
Basos: 1 %
EOS (ABSOLUTE): 0 10*3/uL (ref 0.0–0.4)
Eos: 1 %
Hematocrit: 38.1 % (ref 37.5–51.0)
Hemoglobin: 12.8 g/dL — ABNORMAL LOW (ref 13.0–17.7)
Immature Grans (Abs): 0 10*3/uL (ref 0.0–0.1)
Immature Granulocytes: 1 %
Lymphocytes Absolute: 1.2 10*3/uL (ref 0.7–3.1)
Lymphs: 28 %
MCH: 35.2 pg — ABNORMAL HIGH (ref 26.6–33.0)
MCHC: 33.6 g/dL (ref 31.5–35.7)
MCV: 105 fL — ABNORMAL HIGH (ref 79–97)
Monocytes Absolute: 0.6 10*3/uL (ref 0.1–0.9)
Monocytes: 14 %
Neutrophils Absolute: 2.4 10*3/uL (ref 1.4–7.0)
Neutrophils: 55 %
Platelets: 200 10*3/uL (ref 150–450)
RBC: 3.64 x10E6/uL — ABNORMAL LOW (ref 4.14–5.80)
RDW: 12.8 % (ref 11.6–15.4)
WBC: 4.3 10*3/uL (ref 3.4–10.8)

## 2019-06-08 MED ORDER — ALBUTEROL SULFATE HFA 108 (90 BASE) MCG/ACT IN AERS: 2.0000 | INHALATION_SPRAY | Freq: Four times a day (QID) | RESPIRATORY_TRACT | 0 refills | Status: DC | PRN

## 2019-06-08 MED ORDER — CETIRIZINE HCL 10 MG PO TABS: 10.0000 mg | ORAL_TABLET | Freq: Every day | ORAL | 1 refills | Status: DC

## 2019-06-08 MED ORDER — SPIRIVA HANDIHALER 18 MCG IN CAPS: 18.0000 ug | ORAL_CAPSULE | Freq: Every day | RESPIRATORY_TRACT | 6 refills | Status: DC

## 2019-06-08 MED ORDER — CYCLOBENZAPRINE HCL 10 MG PO TABS: 10.0000 mg | ORAL_TABLET | Freq: Every evening | ORAL | 0 refills | Status: DC | PRN

## 2019-06-08 NOTE — Telephone Encounter (Signed)
I sent refills on meds to [facility]

## 2019-06-08 NOTE — Telephone Encounter (Signed)
Call placed to patient as a follow up from the call with Terry/ [facility] DON yesterday.  He said that he brings his medications to the jail with him and they take them from him and won't give them to him even if he says he can't breathe.   He explained that he is just thrown into the general population and is not allowed to speak to a nurse,   This CM explained that Aurther Loft said  that this CM can fax the medication orders to the jail and they will give him the medications either from their stock or from his medication bottles and he can keep his inhalers on his person.  He said that doesn't work and he did not want this CM faxing the medication orders to the jail. He said that " they don't care."  He said that he spoke to his probation officer and hopes to have community service in lieu of the jail time he has left.  This CM explained that the community service option may not be resolved by this weekend and he would need to have his meds and he still said he did not want the med list faxed to the jail.  He was agreeable to having this CM call his probation officer, Officer Lyons,  to further discuss the situation.    The medication list was reviewed with the patient.  He confirmed that the carvedilol, furosemide and hydralazine were delivered by [facility].  He has the entresto and correctly stated the strength and orders.  He does not have any inhalers, Spiriva and ProAir,  and would like the orders to be sent to [facility].  He said that he left the cyclobenzaprine in [place], so he does not have any. He also said that the pantoprazole was not delivered and he noted that he is not using the kenalog.  He said that he has an aide from [facility] that usually comes daily to set up his meds for him.    Call placed to Alecia Lemming [facility].  He said that the pantoprazole was not delivered because records show that it was just filled at another pharmacy on 06/03/2019. The  record does not show where it was filled.  He also said that he will need prescriptions for the certirizine, [employer] and Spiriva.    Call placed to [facility], message left with call back requested to this CM .

## 2019-06-14 ENCOUNTER — Telehealth (HOSPITAL_COMMUNITY): Payer: Self-pay

## 2019-06-14 NOTE — Telephone Encounter (Signed)
Heart Failure Stewardship Pharmacist  Transitions of Care Follow-up Call   PCP: Hoy Register, MD PCP-Cardiologist: Chilton Si, MD    HPI and Hospital Course:  54 yo M with PMH significant for HTN, prior CVA, chronic systolic and diastolic HF s/p ICD, OSA, non-compliance, and polysubstance abuse admitted on 06/01/19 for shortness of breath and acute HF exacerbation in the setting of non-compliance.  ECHO 10/2017 with LVEF 20-25%. ICD implanted 12/2017. Cardiology was consulted and pt underwent diuresis. Due to lower blood pressures and AKI, Entresto was held. He underwent RHC on day of discharge (06/03/19) which showed normal right heart pressures and a PCWP of <10.    Overall, he is feeling pretty good but expresses concern for dizziness shortly after taking his medications. He reports one syncopal episode in his kitchen. He has a BP cuff at home but has not checked it when he has these episodes. He denies chest pain or palpitations. He says his breathing is good and denies shortness of breath. He is able to complete all ADLs. Denies peripheral edema, PND, or orthopnea. He takes furosemide 40 mg BID but does not weigh himself at home. He says he will be getting a scale from Dr. Leonides Sake office at his next appointment on June 4th.    Pertinent Lab Values: . Serum creatinine 0.93, BUN 20, Potassium 4.7, Sodium 142, BNP 1013  HF Medications: Furosemide 40 mg BID Carvedilol 12.5 mg BID Entresto 97/103 mg BID Hydralazine 50 mg BID  *Has not tolerated Bidil in the past due to headaches   Did the pt receive and understand the discharge instructions provided? yes  Have you obtained your medications from the pharmacy after your hospital discharge? yes  Are you experiencing any side effects to your medications: yes - dizziness, syncopal event  Medication Assistance / Insurance Benefits Check:  Does the patient have prescription insurance?   yes Type of insurance plan: [place]  Medicaid  Does the patient qualify for medication assistance through manufacturers or grants? No  Eligible grants and/or patient assistance programs: None  Medication assistance applications in progress: None  Medication assistance applications approved: None  Approved medication assistance renewals will be completed by: Dr. Leonides Sake office   Assessment: 1. Chronic systolic CHF (EF 23-55%), due to polysubstance abuse and non-compliance. NYHA class III symptoms. - Continue furosemide 40 mg BID - Continue carvedilol 12.5 mg BID - Conitnue Entresto 97/103 mg BID - May need to stop hydralazine with presumable hypotensive episodes - Caution adding spironolactone in the future with elevated baseline K - Follow up appointment with [name] on 06/24/19. Patient is requesting transportation assistance.   Plan: 1) Medication changes recommended at this time: - May need to stop or reduce hydralazine with presumable hypotensive episodes -Provider contacted: [name]  2) Patient assistance application(s): - None  3) Education - Patient has been educated on current HF medications: Entresto, carvedilol, furosemide, hydralazine -Patient verbalizes understanding that over the next few months, these medication doses may change and more medications may be added to optimize HF regimen -Patient has been educated on basic disease state pathophysiology and goals of therapy -Time spent ( )   Danae Orleans, PharmD, BCPS HF Stewardship Pharmacist Phone [phone] 06/14/2019       10:16 AM

## 2019-06-15 NOTE — Telephone Encounter (Signed)
Stop hydralazine.  Track BP and f/u with me, pharmD or APP in a couple weeks.

## 2019-06-17 NOTE — Telephone Encounter (Signed)
Advised patient, verbalized understanding  Patient has appt 6/4 already, will keep as scheduled

## 2019-06-17 NOTE — Addendum Note (Signed)
Addended by: Regis Bill B on: 06/17/2019 07:12 PM   Modules accepted: Orders

## 2019-06-24 ENCOUNTER — Other Ambulatory Visit: Payer: Self-pay

## 2019-06-24 ENCOUNTER — Ambulatory Visit (INDEPENDENT_AMBULATORY_CARE_PROVIDER_SITE_OTHER): Payer: Medicaid Other | Admitting: Cardiovascular Disease

## 2019-06-24 ENCOUNTER — Encounter: Payer: Self-pay | Admitting: Cardiovascular Disease

## 2019-06-24 VITALS — BP 150/72 | HR 89 | Ht 68.0 in | Wt 140.4 lb

## 2019-06-24 DIAGNOSIS — I10 Essential (primary) hypertension: Secondary | ICD-10-CM | POA: Diagnosis not present

## 2019-06-24 DIAGNOSIS — Z5181 Encounter for therapeutic drug level monitoring: Secondary | ICD-10-CM | POA: Diagnosis not present

## 2019-06-24 DIAGNOSIS — I5042 Chronic combined systolic (congestive) and diastolic (congestive) heart failure: Secondary | ICD-10-CM

## 2019-06-24 DIAGNOSIS — I426 Alcoholic cardiomyopathy: Secondary | ICD-10-CM | POA: Diagnosis not present

## 2019-06-24 MED ORDER — CARVEDILOL 6.25 MG PO TABS: 6.2500 mg | ORAL_TABLET | Freq: Two times a day (BID) | ORAL | 3 refills | Status: DC

## 2019-06-24 MED ORDER — FUROSEMIDE 40 MG PO TABS: ORAL_TABLET | ORAL | 5 refills | Status: DC
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
## 2019-06-24 NOTE — Patient Instructions (Addendum)
Medication Instructions:  TAKE FUROSEMIDE 40 MG 1 AND 1/2 TABLETS TWICE A DAY   TAKE CARVEDILOL 6.25 MG TWICE A DAY   CONTINUE ENTRESTO AS YOU ARE TAKING  *If you need a refill on your cardiac medications before your next appointment, please call your pharmacy*  Lab Work: BMET TODAY   If you have labs (blood work) drawn today and your tests are completely normal, you will receive your results only by: Marland Kitchen MyChart Message (if you have MyChart) OR . A paper copy in the mail If you have any lab test that is abnormal or we need to change your treatment, we will call you to review the results.  Testing/Procedures: NONE   Follow-Up: At [facility], you and your health needs are our priority.  As part of our continuing mission to provide you with exceptional heart care, we have created designated Provider Care Teams.  These Care Teams include your primary Cardiologist (physician) and Advanced Practice Providers (APPs -  Physician Assistants and Nurse Practitioners) who all work together to provide you with the care you need, when you need it.  We recommend signing up for the patient portal called "MyChart".  Sign up information is provided on this After Visit Summary.  MyChart is used to connect with patients for Virtual Visits (Telemedicine).  Patients are able to view lab/test results, encounter notes, upcoming appointments, etc.  Non-urgent messages can be sent to your provider as well.   To learn more about what you can do with MyChart, go to [email or web address].    Your next appointment:   4 week(s)  The format for your next appointment:   In Person  Provider:   You may see Chilton Si, MD or one of the following Advanced Practice Providers on your designated Care Team:    Corine Shelter, PA-C  [place], [place]  Edd Fabian, [place]

## 2019-06-25 LAB — BASIC METABOLIC PANEL
BUN/Creatinine Ratio: 19 (ref 9–20)
BUN: 18 mg/dL (ref 6–24)
CO2: 21 mmol/L (ref 20–29)
Calcium: 9.7 mg/dL (ref 8.7–10.2)
Chloride: 103 mmol/L (ref 96–106)
Creatinine, Ser: 0.97 mg/dL (ref 0.76–1.27)
GFR calc Af Amer: 103 mL/min/{1.73_m2} (ref >59)
GFR calc non Af Amer: 89 mL/min/{1.73_m2} (ref >59)
Glucose: 75 mg/dL (ref 65–99)
Potassium: 3.8 mmol/L (ref 3.5–5.2)
Sodium: 142 mmol/L (ref 134–144)

## 2019-07-01 ENCOUNTER — Encounter: Payer: Self-pay | Admitting: Cardiovascular Disease

## 2019-07-04 ENCOUNTER — Ambulatory Visit: Payer: Medicaid Other | Admitting: Family Medicine

## 2019-07-04 NOTE — Progress Notes (Signed)
Subjective:    Patient ID: [name], male    DOB: Dec 22, 1965, 54 y.o.   MRN: [id number] Virtual Visit via Telephone Note  I connected with [name] on 07/05/19 at  3:00 PM EDT by telephone and verified that I am speaking with the correct person using two identifiers.   Consent:  I discussed the limitations, risks, security and privacy concerns of performing an evaluation and management service by telephone and the availability of in person appointments. I also discussed with the patient that there may be a patient responsible charge related to this service. The patient expressed understanding and agreed to proceed.  Location of patient: Patient was at home  Location of provider: I was in the office  Persons participating in the televisit with the patient.   No one else on the call    History of Present Illness:  54 y.o.M with CHF HTNive heart dz, CVA, ETOH CM, NICM TCM viist post hosp  This patient's been in the criminal justice system and had to go to the jail on weekends and has a history of hypertension longstanding with nonischemic cardiomyopathy. The patient is a heavy drinker. Patient is also noted blood in the stool and emesis of blood. The patient was admitted between the 12th and 14 May and this is a post hospital transition of care visit. Below is the discharge summary.  Dc summary Date of Admission: 06/01/2019 11:03 AM Date of Discharge: 06/03/2019 Attending Physician: Dr. Debe Wells  Discharge Diagnosis: 1. Acute on chronic combined heart failure in setting of non-compliance 2. NICM s/p ICD 3. AKI 4. Hematemesis/hematochezia  Discharge Medications: Allergies as of 06/03/2019     Reactions  Penicillins Other (See Comments)  Blisters  Has patient had a PCN reaction causing immediate rash, facial/tongue/throat swelling, SOB or lightheadedness with hypotension: Yes Has patient had a PCN reaction causing severe rash involving mucus membranes or  skin necrosis: Yes Has patient had a PCN reaction that required hospitalization: No Has patient had a PCN reaction occurring within the last 10 years: No If all of the above answers are "NO", then may proceed with Cephalosporin use.   Medication List  STOP taking these medications  magnesium oxide 400 MG tablet Commonly known as: MAG-OX  multivitamin with minerals Tabs tablet   TAKE these medications  aspirin EC 81 MG tablet Take 81 mg by mouth daily.  carvedilol 6.25 MG tablet Commonly known as: COREG Take 1 tablet (6.25 mg total) by mouth 2 (two) times daily with a meal. What changed:   medication strength  how much to take  when to take this  cetirizine 10 MG tablet Commonly known as: ZYRTEC Take 1 tablet (10 mg total) by mouth daily.  cyclobenzaprine 10 MG tablet Commonly known as: FLEXERIL Take 1 tablet (10 mg total) by mouth at bedtime. What changed:   when to take this  reasons to take this  Entresto 97-103 MG Generic drug: sacubitril-valsartan Take 0.5 tablets by mouth 2 (two) times daily. What changed: how much to take  furosemide 40 MG tablet Commonly known as: LASIX Take 60 mg by mouth 2 (two) times daily.  hydrALAZINE 50 MG tablet Commonly known as: APRESOLINE Take 1 tablet (50 mg total) by mouth 2 (two) times a day.  pantoprazole 40 MG tablet Commonly known as: PROTONIX Take 1 tablet (40 mg total) by mouth daily.  ProAir HFA 108 (90 Base) MCG/ACT inhaler Generic drug: albuterol INHALE 2 PUFFS INTO THE LUNGS EVERY  6 (SIX) HOURS AS NEEDED FOR WHEEZING OR SHORTNESS OF BREATH. What changed: See the new instructions.  tiotropium 18 MCG inhalation capsule Commonly known as: Spiriva HandiHaler Place 1 capsule (18 mcg total) into inhaler and inhale daily.  triamcinolone cream 0.1 % Commonly known as: KENALOG Apply 1 application topically 2 (two) times daily. What changed:   when to take this  reasons to take  this     Disposition and follow-up:   [name] was discharged from [facility] in Stable condition.  At the hospital follow up visit please address:  1. NICM s/p ICD. Acute on chronic combined heart failure in setting of non-compliance. Pt noted that he is unable to take his heart failure medications during his weekend stays in jail. Unclear if this is the whole picture however encouraged him to continue to take as directed by cardiology.  Underwent diuresis. Underwent RHC on day of discharge which showed normal right heart pressures. Wedge pressure <68mmHg. CO 4.25L/min. -Carvedilol continued at discharge however cardiology is wanting to hold entresto for now given his SBPs being in the low 100s. -Will need close outpatient follow up with cardiology for further evaluation of BP/CHF regimen  2. AKI. Likely cardiorenal. Resolved with diuresis.  3. Hematemesis/hematochezia. Pt had reported this on admission. He noted 2 episodes of dark red blood in his stools that occurred last week and had 2 episodes of dark red emesis a few days ago.  Given his history of alcohol use, I obtained a RUQ [name] to evaluate for possible cirrhosis>varicies. RUQ [name] was normal. Due to being in an acute heart failure exacerbation, we did not consult GI during admission, as he did not have any further episodes and hgb was 15 on admission indicating that it was unlikely this was a major bleed. -obtain a repeat CBC at follow up to further evaluate -GI referral for colonoscopy/EGD  2.  Labs / imaging needed at time of follow-up: CBC, BMP  3.  Pending labs/ test needing follow-up: none  Below is the nursing transition care note NURSE TCM NOTE:    Transition Care Management Follow-up Telephone Call  Date of discharge and from where: 06/03/2019, [facility]   How have you been since you were released from the hospital? No complaints reported   Any questions or concerns? he  explained that he has to spend the weekends in jail ( Friday-Sunday) as part of his probation.  When he is in the jail they will not allow him to take his medications  He is hoping that a letter from the provider would allow him to take his medications or allow him to do community service in lieu of jail time. Instructed him to bring his probation officer's contact information to the clinic with him tomorrow and this CM will contact the probation officer to discuss possible options.   He receives disability.    Items Reviewed:  Did the pt receive and understand the discharge instructions provided?yes, he said he has them   Medications obtained and verified? does not have all medications,  Needs to pick up protonix. Did not have med list with him at the time of the call.  Instructed him to bring his medications with him to his appt tomorrow,  Any new allergies since your discharge?  none reported  Do you have support at home?  lives alone.  Has a son in the area but he does not provide much assistance.   Other (ie: DME,  Home Health, etc) no DME or home health ordered.   Doesn't have a scale.   Functional Questionnaire: (I = Independent and D = Dependent) ADL's:independent   Follow up appointments reviewed:    PCP Hospital f/u appt confirmed?.Dr Joya Gaskins 06/07/2019 @ [facility] f/u appt confirmed? .cardiology - 06/24/2019  Are transportation arrangements needed?  no  If their condition worsens, is the pt aware to call  their PCP or go to the ED? yes  Was the patient provided with contact information for the PCP's office or ED? he has the phone number on the AVS  Was the pt encouraged to call back with questions or concerns?  yes and reminded him of his appt tomorrow at [facility].  Reviewed the clinic address  Patient comes in today accompanied with his significant other and is anxious because he is required to go to the jail over the weekend but upon discussing with  the probation officer they often will put him in general population he will not have access to his meds and they will not prescribe meds from the jail. His probation does end in August Wt Readings from Last 3 Encounters: 06/07/19 : 150 lb 12.8 oz (68.4 kg) 06/03/19 : 139 lb 6.4 oz (63.2 kg) 10/04/18 : 145 lb 9.6 oz (66 kg)  The patient's discharge summary indicated medication reconciliation been done with the patient's current medicines he brings in with him today did not match the medication reconciliation  Patient states his dyspnea is improved compared to the hospitalization he is not having chest pain. He denies edema. He does note abdominal bloating.  07/05/2019 This patient is seen in return follow-up with a telephone visit.  He was not able to connect by video.  The patient notes when his Coreg at all was changed to 12.5 mg twice daily he noted several days later increased speech impediment.  He noted stuttering and also slurring of the speech.  He states he did not have change in the strength of his extremities.  He has had no headaches or change in vision.  He does have a previous history of stroke in 2017.  He states his blood pressure has been in reasonable control in the 150/90 range.  He has been taking his medicines and has been compliant.  He is not using cocaine any longer.  He was seen by Dr. Oval Linsey recently and his Coreg dose was reduced to 6.25 mg twice daily. Patient also states that he has a court date in July with his judge and Engineer, manufacturing systems.  He is not been required to go into the criminal justice system on weekends any longer because of his illness.   Past Medical History:  Diagnosis Date  . Accelerated hypertension 12/06/2014  . CHF (congestive heart failure) ([place])   . CHF exacerbation ([place]) 12/22/2016  . Cocaine abuse ([place])   . Headache   . Hypertension   . Stroke ([place]) 02/24/2015  . Thrombocytopenia ([place]) 07/31/2017     Family History  Problem Relation Age of  Onset  . Hypertension Mother   . Hyperlipidemia Mother   . Hypertension Father   . Stroke Maternal Aunt   . Hypertension Sister   . Hypertension Brother      Social History   Socioeconomic History  . Marital status: Single    Spouse name: Not on file  . Number of children: 1  . Years of education: 80  . Highest education level: Not on file  Occupational History  .  Not on file  Tobacco Use  . Smoking status: Current Every Day Smoker    Packs/day: 1.00    Years: 20.00    Pack years: 20.00    Types: Cigarettes  . Smokeless tobacco: Never Used  . Tobacco comment: 05/14/16 smoking 1 PPD  Vaping Use  . Vaping Use: Never used  Substance and Sexual Activity  . Alcohol use: Yes    Alcohol/week: 1.0 standard drink    Types: 1 Cans of beer per week    Comment: socially   . Drug use: Yes    Types: Cocaine    Comment: stopped using cocaine 11/19/14  . Sexual activity: Not on file  Other Topics Concern  . Not on file  Social History Narrative   Lives alone, nurse comes 2 hrs daily   Social Determinants of Health   Financial Resource Strain:   . Difficulty of Paying Living Expenses:   Food Insecurity:   . Worried About Programme researcher, broadcasting/film/video in the Last Year:   . Barista in the Last Year:   Transportation Needs:   . Freight forwarder (Medical):   Marland Kitchen Lack of Transportation (Non-Medical):   Physical Activity:   . Days of Exercise per Week:   . Minutes of Exercise per Session:   Stress:   . Feeling of Stress :   Social Connections:   . Frequency of Communication with Friends and Family:   . Frequency of Social Gatherings with Friends and Family:   . Attends Religious Services:   . Active Member of Clubs or Organizations:   . Attends Banker Meetings:   Marland Kitchen Marital Status:   Intimate Partner Violence:   . Fear of Current or Ex-Partner:   . Emotionally Abused:   Marland Kitchen Physically Abused:   . Sexually Abused:      Allergies  Allergen Reactions  .  Penicillins Other (See Comments)    Blisters  Has patient had a PCN reaction causing immediate rash, facial/tongue/throat swelling, SOB or lightheadedness with hypotension: Yes Has patient had a PCN reaction causing severe rash involving mucus membranes or skin necrosis: Yes Has patient had a PCN reaction that required hospitalization: No Has patient had a PCN reaction occurring within the last 10 years: No If all of the above answers are "NO", then may proceed with Cephalosporin use.      Outpatient Medications Prior to Visit  Medication Sig Dispense Refill  . albuterol (PROAIR HFA) 108 (90 Base) MCG/ACT inhaler Inhale 2 puffs into the lungs every 6 (six) hours as needed for shortness of breath. 18 g 0  . carvedilol (COREG) 6.25 MG tablet Take 1 tablet (6.25 mg total) by mouth 2 (two) times daily with a meal. 60 tablet 3  . cetirizine (ZYRTEC) 10 MG tablet Take 1 tablet (10 mg total) by mouth daily. 30 tablet 1  . cyclobenzaprine (FLEXERIL) 10 MG tablet Take 1 tablet (10 mg total) by mouth at bedtime as needed for muscle spasms. 30 tablet 0  . furosemide (LASIX) 40 MG tablet TAKE 1 AND 1/2 TABLETS TWICE A DAY 90 tablet 5  . pantoprazole (PROTONIX) 40 MG tablet Take 1 tablet (40 mg total) by mouth daily. 30 tablet 6  . sacubitril-valsartan (ENTRESTO) 97-103 MG Take 0.5 tablets by mouth 2 (two) times daily. (Patient taking differently: Take 0.5 tablets by mouth 2 (two) times daily. One tablet 2x a day) 30 tablet 0  . tiotropium (SPIRIVA HANDIHALER) 18 MCG inhalation capsule Place  1 capsule (18 mcg total) into inhaler and inhale daily. 30 capsule 6  . triamcinolone cream (KENALOG) 0.1 % Apply 1 application topically 2 (two) times daily. (Patient taking differently: Apply 1 application topically daily as needed (rash/irritation). ) 80 g 1   No facility-administered medications prior to visit.      Review of Systems  Constitutional: Positive for activity change and fatigue. Negative for  appetite change.  Respiratory: Negative for shortness of breath.   Cardiovascular: Negative for chest pain, palpitations and leg swelling.  Gastrointestinal: Negative for abdominal distention.  Neurological: Positive for speech difficulty. Negative for dizziness, tremors, seizures, syncope, facial asymmetry, weakness, light-headedness, numbness and headaches.  Psychiatric/Behavioral: Positive for behavioral problems and decreased concentration. Negative for self-injury, sleep disturbance and suicidal ideas. The patient is nervous/anxious. The patient is not hyperactive.        Objective:   Physical Exam There were no vitals filed for this visit. No exam as this is a phone visit On the phone the patient definitely has stuttering as well as difficulty with word retrieval and slowness in speech    Assessment & Plan:  I personally reviewed all images and lab data in the [facility] system as well as any outside material available during this office visit and agree with the  radiology impressions.   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  Diagnoses and all orders for this visit:  Slurred speech -     CT Head Wo Contrast; Future -     Ambulatory referral to Neurology  History of CVA (cerebrovascular accident) -     CT Head Wo Contrast; Future -     Ambulatory referral to Neurology  Alcoholic cardiomyopathy (HCC)  Benign  essential HTN  History of stroke   Follow Up Instructions: Patient knows a CT scan of the head will be obtained and the neurology appointment obtained he will also have follow-up visit with his primary care provider Dr. Alvis Lemmings   I discussed the assessment and treatment plan with the patient. The patient was provided an opportunity to ask questions and all were answered. The patient agreed with the plan and demonstrated an understanding of the instructions.   The patient was advised to call back or seek an in-person evaluation if the symptoms worsen or if the condition fails to improve as anticipated.  I provided 30 minutes of non-face-to-face time during this encounter  including  median intraservice time , review of notes, labs, imaging, medications  and explaining diagnosis and management to the patient .    Shan Levans, MD

## 2019-07-05 ENCOUNTER — Encounter: Payer: Self-pay | Admitting: Critical Care Medicine

## 2019-07-05 ENCOUNTER — Ambulatory Visit: Payer: Medicaid Other | Attending: Critical Care Medicine | Admitting: Critical Care Medicine

## 2019-07-05 DIAGNOSIS — R4781 Slurred speech: Secondary | ICD-10-CM | POA: Diagnosis not present

## 2019-07-05 DIAGNOSIS — Z8673 Personal history of transient ischemic attack (TIA), and cerebral infarction without residual deficits: Secondary | ICD-10-CM

## 2019-07-05 DIAGNOSIS — I426 Alcoholic cardiomyopathy: Secondary | ICD-10-CM | POA: Diagnosis not present

## 2019-07-05 DIAGNOSIS — I10 Essential (primary) hypertension: Secondary | ICD-10-CM | POA: Diagnosis not present

## 2019-07-05 NOTE — Assessment & Plan Note (Signed)
Alcohol induced cardiomyopathy continue heart failure medications as prescribed

## 2019-07-05 NOTE — Assessment & Plan Note (Signed)
Slurred speech and some degree of difficulty with word retrieval which appears to be new  Plan is obtain a CT scan of the head and refer back to neurology

## 2019-07-05 NOTE — Assessment & Plan Note (Signed)
History of hypertension we will continue current medications as prescribed

## 2019-07-05 NOTE — Assessment & Plan Note (Signed)
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

## 2019-07-06 ENCOUNTER — Other Ambulatory Visit: Payer: Self-pay | Admitting: Critical Care Medicine

## 2019-07-06 ENCOUNTER — Telehealth: Payer: Self-pay | Admitting: Family Medicine

## 2019-07-06 ENCOUNTER — Telehealth: Payer: Self-pay

## 2019-07-06 DIAGNOSIS — R4781 Slurred speech: Secondary | ICD-10-CM

## 2019-07-06 DIAGNOSIS — Z8673 Personal history of transient ischemic attack (TIA), and cerebral infarction without residual deficits: Secondary | ICD-10-CM

## 2019-07-06 NOTE — Telephone Encounter (Signed)
Enid Derry from [place] called saying that they denied the CT for the patient. If the provider decides to do a peer to peer they have 5 days. Please f/u

## 2019-07-06 NOTE — Telephone Encounter (Signed)
Called [employer] to update clinical information. CT was approved due to the fact that patient has a pacemaker. Will call scheduling to get patient set up with an appointment.

## 2019-07-06 NOTE — Telephone Encounter (Signed)
Patient's CT is scheduled at [facility] on 07/07/2019 @ 9:45 AM.

## 2019-07-06 NOTE — Telephone Encounter (Signed)
Approval # [id number]

## 2019-07-06 NOTE — Telephone Encounter (Signed)
Peer to peer auth MRI of brain

## 2019-07-06 NOTE — Addendum Note (Signed)
Addended by: Heidi Dach on: 07/06/2019 01:35 PM   Modules accepted: Orders

## 2019-07-06 NOTE — Telephone Encounter (Signed)
Please call [employer] and see it they will approve the CT of the chest due to patient having a pacemaker placed in 2021.  If they will not approve call patient and let him know to go to the Ed for evaluation.

## 2019-07-06 NOTE — Progress Notes (Signed)
Change CT head to MRI of brain

## 2019-07-06 NOTE — Telephone Encounter (Signed)
Called pt.

## 2019-07-06 NOTE — Telephone Encounter (Signed)
Will route to PCP to inform the Prior auth for CT scan was denied.

## 2019-07-07 ENCOUNTER — Ambulatory Visit (HOSPITAL_COMMUNITY): Payer: Medicaid Other

## 2019-07-08 ENCOUNTER — Other Ambulatory Visit (HOSPITAL_COMMUNITY): Payer: Medicaid Other

## 2019-07-08 ENCOUNTER — Ambulatory Visit (HOSPITAL_COMMUNITY): Payer: Medicaid Other

## 2019-07-08 ENCOUNTER — Telehealth: Payer: Self-pay | Admitting: Family Medicine

## 2019-07-08 NOTE — Telephone Encounter (Signed)
error 

## 2019-07-08 NOTE — Telephone Encounter (Signed)
Patient called saying that he missed his CT appointment today at [facility]. Looked at patient chart and it looks like he was approved to have an MRI. Patient has a pacemaker and was told by the MRI department that because he has the pacemaker he needed to contact his PCP. Please f/u

## 2019-07-11 NOTE — Telephone Encounter (Signed)
Patient has been set up with an appointment for Neurology, patient has been seeing Dr.Wright for these recent issues.

## 2019-07-19 ENCOUNTER — Other Ambulatory Visit: Payer: Self-pay | Admitting: Critical Care Medicine

## 2019-07-19 DIAGNOSIS — R059 Cough, unspecified: Secondary | ICD-10-CM
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
## 2019-07-29 ENCOUNTER — Ambulatory Visit: Payer: Medicaid Other | Admitting: General Practice

## 2019-08-02 ENCOUNTER — Encounter: Payer: Self-pay | Admitting: Cardiology

## 2019-08-02 ENCOUNTER — Telehealth (INDEPENDENT_AMBULATORY_CARE_PROVIDER_SITE_OTHER): Payer: Medicaid Other | Admitting: Cardiology

## 2019-08-02 VITALS — Ht 68.0 in | Wt 140.0 lb

## 2019-08-02 DIAGNOSIS — Z8673 Personal history of transient ischemic attack (TIA), and cerebral infarction without residual deficits: Secondary | ICD-10-CM | POA: Diagnosis not present

## 2019-08-02 DIAGNOSIS — Z95810 Presence of automatic (implantable) cardiac defibrillator: Secondary | ICD-10-CM | POA: Diagnosis not present

## 2019-08-02 DIAGNOSIS — I426 Alcoholic cardiomyopathy: Secondary | ICD-10-CM

## 2019-08-02 DIAGNOSIS — I5042 Chronic combined systolic (congestive) and diastolic (congestive) heart failure: Secondary | ICD-10-CM | POA: Diagnosis not present
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
## 2019-08-02 NOTE — Patient Instructions (Addendum)
Medication Instructions:  Your physician recommends that you continue on your current medications as directed. Please refer to the Current Medication list given to you today.  *If you need a refill on your cardiac medications before your next appointment, please call your pharmacy*  Lab Work: None ordered today  If you have labs (blood work) drawn today and your tests are completely normal, you will receive your results only by: Marland Kitchen MyChart Message (if you have MyChart) OR . A paper copy in the mail If you have any lab test that is abnormal or we need to change your treatment, we will call you to review the results.  Testing/Procedures: None ordered today  Follow-Up:  On 10/07/19 at 3:00PM with [name]

## 2019-08-15 ENCOUNTER — Ambulatory Visit (INDEPENDENT_AMBULATORY_CARE_PROVIDER_SITE_OTHER): Payer: Medicaid Other | Admitting: *Deleted

## 2019-08-15 DIAGNOSIS — I5042 Chronic combined systolic (congestive) and diastolic (congestive) heart failure: Secondary | ICD-10-CM

## 2019-08-15 DIAGNOSIS — I428 Other cardiomyopathies: Secondary | ICD-10-CM

## 2019-08-15 LAB — CUP PACEART REMOTE DEVICE CHECK
Battery Remaining Longevity: 85 mo
Battery Remaining Percentage: 84 %
Battery Voltage: 3.02 V
Brady Statistic RV Percent Paced: 1 %
Date Time Interrogation Session: 20210726020017
HighPow Impedance: 55 Ohm
HighPow Impedance: 55 Ohm
Implantable Lead Implant Date: 20191230
Implantable Lead Implant Date: 20191230
Implantable Lead Location: 753860
Implantable Lead Location: 753860
Implantable Lead Model: 7122
Implantable Lead Model: 7122
Implantable Pulse Generator Implant Date: 20191230
Lead Channel Impedance Value: 490 Ohm
Lead Channel Pacing Threshold Amplitude: 1 V
Lead Channel Pacing Threshold Pulse Width: 0.5 ms
Lead Channel Sensing Intrinsic Amplitude: 11.6 mV
Lead Channel Setting Pacing Amplitude: 2.5 V
Lead Channel Setting Pacing Pulse Width: 0.5 ms
Lead Channel Setting Sensing Sensitivity: 0.5 mV
Pulse Gen Serial Number: 9850980

## 2019-08-16 NOTE — Progress Notes (Signed)
Remote pacemaker transmission.   

## 2019-08-17 ENCOUNTER — Telehealth: Payer: Self-pay

## 2019-08-17 ENCOUNTER — Institutional Professional Consult (permissible substitution): Payer: Medicaid Other | Admitting: Neurology

## 2019-08-17 NOTE — Telephone Encounter (Signed)
Patient no-showed today's appointment.

## 2019-08-19 ENCOUNTER — Telehealth: Payer: Self-pay | Admitting: *Deleted

## 2019-08-19 MED ORDER — METOPROLOL SUCCINATE ER 25 MG PO TB24
25.0000 mg | ORAL_TABLET | Freq: Every day | ORAL | 3 refills | Status: DC
Start: 2019-08-19 — End: 2020-02-17

## 2019-08-19 NOTE — Telephone Encounter (Signed)
-----   Message from Will Jorja Loa, MD sent at 08/16/2019  5:07 PM EDT ----- Joen Laura try toprol xl 25 mg at bedtime and can increase as tolerated. ----- Message ----- From: Baird Lyons, RN Sent: 08/15/2019   3:04 PM EDT To: Regan Lemming, MD, Baird Lyons, RN  Per [facility] 7/13 telemedicine note:    He has had problems with carvedilol.  He says he finally stopped it.  He says when when he took it it would make him dizzy and he would "pass out".  He says since he stopped it altogether he is felt fine.  He did try a lower dose of 6.25 mg twice daily but he had symptoms on that as well.  Dr. Elberta Fortis - please advise

## 2019-08-19 NOTE — Telephone Encounter (Signed)
Pt advised Dr. Elberta Fortis recommends trying Toprol 25 mg at bedtime. Pt agreeable. Advised to call office with any issues after medication start. Rx sent to [facility] per request. Patient verbalized understanding and agreeable to plan.     Original message for phone call:  Abnormal device interrogation reviewed. Lead parameters and battery status stable. Multiple SVT episodes noted. Increase carvedilol to 12.5 mg twice daily.

## 2019-10-07 ENCOUNTER — Ambulatory Visit: Payer: Medicaid Other | Admitting: Cardiovascular Disease

## 2019-11-14 ENCOUNTER — Ambulatory Visit (INDEPENDENT_AMBULATORY_CARE_PROVIDER_SITE_OTHER): Payer: Medicaid Other

## 2019-11-14 DIAGNOSIS — I428 Other cardiomyopathies: Secondary | ICD-10-CM | POA: Diagnosis not present

## 2019-11-15 LAB — CUP PACEART REMOTE DEVICE CHECK
Battery Remaining Longevity: 85 mo
Battery Remaining Percentage: 83 %
Battery Voltage: 3.01 V
Brady Statistic RV Percent Paced: 1 %
Date Time Interrogation Session: 20211025020020
HighPow Impedance: 56 Ohm
HighPow Impedance: 56 Ohm
Implantable Lead Implant Date: 20191230
Implantable Lead Implant Date: 20191230
Implantable Lead Location: 753860
Implantable Lead Location: 753860
Implantable Lead Model: 7122
Implantable Lead Model: 7122
Implantable Pulse Generator Implant Date: 20191230
Lead Channel Impedance Value: 540 Ohm
Lead Channel Pacing Threshold Amplitude: 1 V
Lead Channel Pacing Threshold Pulse Width: 0.5 ms
Lead Channel Sensing Intrinsic Amplitude: 11.6 mV
Lead Channel Setting Pacing Amplitude: 2.5 V
Lead Channel Setting Pacing Pulse Width: 0.5 ms
Lead Channel Setting Sensing Sensitivity: 0.5 mV
Pulse Gen Serial Number: 9850980

## 2019-11-16 ENCOUNTER — Other Ambulatory Visit: Payer: Self-pay

## 2019-11-16 NOTE — Progress Notes (Signed)
Remote ICD transmission.   

## 2020-02-13 ENCOUNTER — Ambulatory Visit (INDEPENDENT_AMBULATORY_CARE_PROVIDER_SITE_OTHER): Payer: Medicaid Other

## 2020-02-13 DIAGNOSIS — I428 Other cardiomyopathies: Secondary | ICD-10-CM | POA: Diagnosis not present

## 2020-02-13 LAB — CUP PACEART REMOTE DEVICE CHECK
Battery Remaining Longevity: 82 mo
Battery Remaining Percentage: 81 %
Battery Voltage: 2.99 V
Brady Statistic RV Percent Paced: 1 %
Date Time Interrogation Session: 20220124020016
HighPow Impedance: 70 Ohm
HighPow Impedance: 70 Ohm
Implantable Lead Implant Date: 20191230
Implantable Lead Implant Date: 20191230
Implantable Lead Location: 753860
Implantable Lead Location: 753860
Implantable Lead Model: 7122
Implantable Lead Model: 7122
Implantable Pulse Generator Implant Date: 20191230
Lead Channel Impedance Value: 540 Ohm
Lead Channel Pacing Threshold Amplitude: 1 V
Lead Channel Pacing Threshold Pulse Width: 0.5 ms
Lead Channel Sensing Intrinsic Amplitude: 11.6 mV
Lead Channel Setting Pacing Amplitude: 2.5 V
Lead Channel Setting Pacing Pulse Width: 0.5 ms
Lead Channel Setting Sensing Sensitivity: 0.5 mV
Pulse Gen Serial Number: 9850980

## 2020-02-17 ENCOUNTER — Telehealth: Payer: Self-pay | Admitting: *Deleted

## 2020-02-17 MED ORDER — METOPROLOL SUCCINATE ER 50 MG PO TB24
50.0000 mg | ORAL_TABLET | Freq: Every day | ORAL | 1 refills | Status: DC
Start: 2020-02-17 — End: 2020-05-22

## 2020-02-17 NOTE — Telephone Encounter (Signed)
Per below MD advisement: Pt made aware of recommendation and is agreeable to plan. Advised to call office is SE arise after medication increase. Patient verbalized understanding and agreeable to plan.     Will Jorja Loa, MD  02/13/2020 1:47 PM EST      Normal remote reviewed. Battery and lead parameters stable. Multiple SVT episodes. Increase toprol xl to 50 mg.

## 2020-02-23 NOTE — Progress Notes (Signed)
Remote ICD transmission.   

## 2020-05-14 ENCOUNTER — Ambulatory Visit (INDEPENDENT_AMBULATORY_CARE_PROVIDER_SITE_OTHER): Payer: Medicaid Other

## 2020-05-14 DIAGNOSIS — I428 Other cardiomyopathies: Secondary | ICD-10-CM | POA: Diagnosis not present

## 2020-05-15 LAB — CUP PACEART REMOTE DEVICE CHECK
Battery Remaining Longevity: 81 mo
Battery Remaining Percentage: 79 %
Battery Voltage: 2.99 V
Brady Statistic RV Percent Paced: 1 %
Date Time Interrogation Session: 20220425020016
HighPow Impedance: 62 Ohm
HighPow Impedance: 62 Ohm
Implantable Lead Implant Date: 20191230
Implantable Lead Implant Date: 20191230
Implantable Lead Location: 753860
Implantable Lead Location: 753860
Implantable Lead Model: 7122
Implantable Lead Model: 7122
Implantable Pulse Generator Implant Date: 20191230
Lead Channel Impedance Value: 530 Ohm
Lead Channel Pacing Threshold Amplitude: 1 V
Lead Channel Pacing Threshold Pulse Width: 0.5 ms
Lead Channel Sensing Intrinsic Amplitude: 11.6 mV
Lead Channel Setting Pacing Amplitude: 2.5 V
Lead Channel Setting Pacing Pulse Width: 0.5 ms
Lead Channel Setting Sensing Sensitivity: 0.5 mV
Pulse Gen Serial Number: 9850980

## 2020-05-18 ENCOUNTER — Telehealth: Payer: Self-pay | Admitting: *Deleted

## 2020-05-18 NOTE — Telephone Encounter (Signed)
-----   Message from Cole Jorja Loa, MD sent at 05/18/2020  9:53 AM EDT ----- Abnormal device interrogation reviewed.  Lead parameters and battery status stable.  Multiple SVT appearing events. Increase toprol xl to 100 mg

## 2020-05-18 NOTE — Telephone Encounter (Signed)
Left message to call back  

## 2020-05-22 ENCOUNTER — Emergency Department (HOSPITAL_COMMUNITY)
Admission: EM | Admit: 2020-05-22 | Discharge: 2020-05-23 | Disposition: A | Discharge status: Home / Self Care | Payer: Medicaid Other | Attending: Emergency Medicine | Admitting: Emergency Medicine

## 2020-05-22 ENCOUNTER — Other Ambulatory Visit: Payer: Self-pay

## 2020-05-22 ENCOUNTER — Encounter (HOSPITAL_COMMUNITY): Payer: Self-pay | Admitting: Emergency Medicine

## 2020-05-22 DIAGNOSIS — I5042 Chronic combined systolic (congestive) and diastolic (congestive) heart failure: Secondary | ICD-10-CM | POA: Diagnosis not present

## 2020-05-22 DIAGNOSIS — F1010 Alcohol abuse, uncomplicated: Secondary | ICD-10-CM | POA: Insufficient documentation

## 2020-05-22 DIAGNOSIS — R569 Unspecified convulsions: Secondary | ICD-10-CM | POA: Diagnosis not present

## 2020-05-22 DIAGNOSIS — Z79899 Other long term (current) drug therapy: Secondary | ICD-10-CM | POA: Insufficient documentation

## 2020-05-22 DIAGNOSIS — R000 Tachycardia, unspecified: Secondary | ICD-10-CM | POA: Diagnosis not present

## 2020-05-22 DIAGNOSIS — R531 Weakness: Secondary | ICD-10-CM | POA: Diagnosis not present

## 2020-05-22 DIAGNOSIS — Z7951 Long term (current) use of inhaled steroids: Secondary | ICD-10-CM | POA: Insufficient documentation

## 2020-05-22 DIAGNOSIS — Z20822 Contact with and (suspected) exposure to covid-19: Secondary | ICD-10-CM | POA: Diagnosis not present

## 2020-05-22 DIAGNOSIS — I10 Essential (primary) hypertension: Secondary | ICD-10-CM | POA: Diagnosis not present

## 2020-05-22 DIAGNOSIS — J449 Chronic obstructive pulmonary disease, unspecified: Secondary | ICD-10-CM | POA: Diagnosis not present

## 2020-05-22 DIAGNOSIS — I110 Hypertensive heart disease with heart failure: Secondary | ICD-10-CM | POA: Diagnosis not present

## 2020-05-22 DIAGNOSIS — Y908 Blood alcohol level of 240 mg/100 ml or more: Secondary | ICD-10-CM | POA: Insufficient documentation

## 2020-05-22 DIAGNOSIS — J9811 Atelectasis: Secondary | ICD-10-CM | POA: Diagnosis not present

## 2020-05-22 DIAGNOSIS — F17210 Nicotine dependence, cigarettes, uncomplicated: Secondary | ICD-10-CM | POA: Insufficient documentation

## 2020-05-22 DIAGNOSIS — J80 Acute respiratory distress syndrome: Secondary | ICD-10-CM | POA: Diagnosis not present

## 2020-05-22 DIAGNOSIS — R404 Transient alteration of awareness: Secondary | ICD-10-CM | POA: Diagnosis not present

## 2020-05-22 DIAGNOSIS — Z7982 Long term (current) use of aspirin: Secondary | ICD-10-CM | POA: Insufficient documentation

## 2020-05-22 MED ORDER — FUROSEMIDE 40 MG PO TABS: ORAL_TABLET | ORAL | 0 refills | Status: DC

## 2020-05-22 MED ORDER — METOPROLOL SUCCINATE ER 100 MG PO TB24
100.0000 mg | ORAL_TABLET | Freq: Every day | ORAL | 1 refills | Status: DC
Start: 2020-05-22 — End: 2020-06-20

## 2020-05-22 NOTE — ED Triage Notes (Signed)
Patient from home.  Patient has had 2 previous strokes, started having seizures after his second stroke.  He has had two seizures tonight.  Patient has left sided facial droop, left arm numbness and speech is slurring more than his baseline per family.  He had a third seizure with EMS.  Patient was given 5mg  IM of Midazolam for the third seizure.

## 2020-05-22 NOTE — Telephone Encounter (Signed)
Pt advised of findings/recommnedation. Pt agreeable to plan. Advised to call office if SE begin after increase. Aware Rx sent in will be for 100 mg tablets.   Pt asked for Lasix refill. Aware I will forward to Dr Leonides Sake nurse to address refill request. Pt asks that Rx be sent to [facility].

## 2020-05-22 NOTE — Telephone Encounter (Signed)
Spoke with patient, scheduled follow up and sent Furosemide to pharmacy  PATIENT ACTUALLY TAKING FUROSEMIDE 40 MG 1/2 IN AM AND 1 MIDDAY  Rx sent with updated directions

## 2020-05-23 ENCOUNTER — Emergency Department (HOSPITAL_COMMUNITY): Payer: Medicaid Other

## 2020-05-23 DIAGNOSIS — J80 Acute respiratory distress syndrome: Secondary | ICD-10-CM | POA: Diagnosis not present

## 2020-05-23 DIAGNOSIS — J9811 Atelectasis: Secondary | ICD-10-CM | POA: Diagnosis not present

## 2020-05-23 DIAGNOSIS — R569 Unspecified convulsions: Secondary | ICD-10-CM | POA: Diagnosis not present

## 2020-05-23 LAB — COMPREHENSIVE METABOLIC PANEL
ALT: 52 U/L — ABNORMAL HIGH (ref 0–44)
AST: 118 U/L — ABNORMAL HIGH (ref 15–41)
Albumin: 3.9 g/dL (ref 3.5–5.0)
Alkaline Phosphatase: 55 U/L (ref 38–126)
Anion gap: 15 (ref 5–15)
BUN: 7 mg/dL (ref 6–20)
CO2: 19 mmol/L — ABNORMAL LOW (ref 22–32)
Calcium: 8.9 mg/dL (ref 8.9–10.3)
Chloride: 104 mmol/L (ref 98–111)
Creatinine, Ser: 0.79 mg/dL (ref 0.61–1.24)
GFR, Estimated: >60 mL/min (ref >60)
Glucose, Bld: 68 mg/dL — ABNORMAL LOW (ref 70–99)
Potassium: 3.7 mmol/L (ref 3.5–5.1)
Sodium: 138 mmol/L (ref 135–145)
Total Bilirubin: 0.8 mg/dL (ref 0.3–1.2)
Total Protein: 6.4 g/dL — ABNORMAL LOW (ref 6.5–8.1)

## 2020-05-23 LAB — CBC WITH DIFFERENTIAL/PLATELET
Abs Immature Granulocytes: 0.01 10*3/uL (ref 0.00–0.07)
Basophils Absolute: 0 10*3/uL (ref 0.0–0.1)
Basophils Relative: 1 %
Eosinophils Absolute: 0 10*3/uL (ref 0.0–0.5)
Eosinophils Relative: 1 %
HCT: 37.3 % — ABNORMAL LOW (ref 39.0–52.0)
Hemoglobin: 13.1 g/dL (ref 13.0–17.0)
Immature Granulocytes: 0 %
Lymphocytes Relative: 24 %
Lymphs Abs: 0.9 10*3/uL (ref 0.7–4.0)
MCH: 35.6 pg — ABNORMAL HIGH (ref 26.0–34.0)
MCHC: 35.1 g/dL (ref 30.0–36.0)
MCV: 101.4 fL — ABNORMAL HIGH (ref 80.0–100.0)
Monocytes Absolute: 0.4 10*3/uL (ref 0.1–1.0)
Monocytes Relative: 11 %
Neutro Abs: 2.3 10*3/uL (ref 1.7–7.7)
Neutrophils Relative %: 63 %
Platelets: 120 10*3/uL — ABNORMAL LOW (ref 150–400)
RBC: 3.68 MIL/uL — ABNORMAL LOW (ref 4.22–5.81)
RDW: 12.7 % (ref 11.5–15.5)
WBC: 3.7 10*3/uL — ABNORMAL LOW (ref 4.0–10.5)
nRBC: 0 % (ref 0.0–0.2)

## 2020-05-23 LAB — RESP PANEL BY RT-PCR (FLU A&B, COVID) ARPGX2
Influenza A by PCR: NEGATIVE
Influenza B by PCR: NEGATIVE
SARS Coronavirus 2 by RT PCR: NEGATIVE

## 2020-05-23 LAB — CBG MONITORING, ED: Glucose-Capillary: 81 mg/dL (ref 70–99)

## 2020-05-23 LAB — PROTIME-INR
INR: 1 (ref 0.8–1.2)
Prothrombin Time: 12.6 seconds (ref 11.4–15.2)

## 2020-05-23 LAB — CK: Total CK: 129 U/L (ref 49–397)

## 2020-05-23 LAB — ETHANOL: Alcohol, Ethyl (B): 299 mg/dL — ABNORMAL HIGH (ref <10)

## 2020-05-23 NOTE — Discharge Instructions (Addendum)
Please be aware you may have another seizure ° °Do not drive until seen by your physician for your condition ° °Do not climb ladders/roofs/trees as a seizure can occur at that height and cause serious harm ° °Do not bathe/swim alone as a seizure can occur and cause serious harm ° °Please followup with your physician or neurologist for further testing and possible treatment ° ° °
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
## 2020-05-23 NOTE — ED Notes (Signed)
Spoke to pts sister pam and provided update

## 2020-05-23 NOTE — ED Notes (Signed)
Pt reports wanting to go the f home. Pt reports wanting to leave this "motherf" place. Pt is next to have ct scan and is refusing. Pt reports not giving a f.. Pt is getting loud and irate stating he doesn't deal with hospitals and he is "pissed off"

## 2020-05-23 NOTE — ED Notes (Addendum)
Pt refusing to stay in hospital. Pt refusing ct, rn explained the importance of getting this scan, pt still refused. Pt demanding his ivs come out and he leaves. Pt stumbling reporting he is going to walk off. Pt cursing out staff saying " dont f ing touch him, I will swing if you guys touch me" . Pt presented discharge instructions, pt refused to listen. Pt kept stumbling off with security. Pt reports his ride is outside. Pt refused wheel chair. Md Bebe Shaggy was informed and present. Discharge vitals unable to be obtained due to patients refusal.

## 2020-05-24 ENCOUNTER — Telehealth: Payer: Self-pay

## 2020-05-24 NOTE — Telephone Encounter (Signed)
Transition Care Management Follow-up Telephone Call  Date of discharge and from where: 05/23/2020 from [facility]  How have you been since you were released from the hospital? Pt stated that he is feeling better today.   Any questions or concerns? No  Items Reviewed:  Did the pt receive and understand the discharge instructions provided? Yes   Medications obtained and verified? Yes   Other? No   Any new allergies since your discharge? No   Dietary orders reviewed? n/a  Do you have support at home? Yes   Functional Questionnaire: (I = Independent and D = Dependent) ADLs: I  Bathing/Dressing- I  Meal Prep- I  Eating- I  Maintaining continence- I  Transferring/Ambulation- I  Managing Meds- I   Follow up appointments reviewed:   PCP Hospital f/u appt confirmed? No    Specialist Hospital f/u appt confirmed? Yes  Scheduled to see Chilton Si, MD on 06/15/2020 @ 10:40am.  Are transportation arrangements needed? No   If their condition worsens, is the pt aware to call PCP or go to the Emergency Dept.? Yes  Was the patient provided with contact information for the PCP's office or ED? Yes  Was to pt encouraged to call back with questions or concerns? Yes

## 2020-05-30 NOTE — Progress Notes (Signed)
Remote ICD transmission.   

## 2020-06-15 ENCOUNTER — Telehealth: Payer: Self-pay | Admitting: Cardiovascular Disease

## 2020-06-15 ENCOUNTER — Encounter: Payer: Self-pay | Admitting: Cardiovascular Disease

## 2020-06-15 ENCOUNTER — Other Ambulatory Visit: Payer: Self-pay

## 2020-06-15 ENCOUNTER — Ambulatory Visit (INDEPENDENT_AMBULATORY_CARE_PROVIDER_SITE_OTHER): Payer: Medicaid Other | Admitting: Cardiovascular Disease

## 2020-06-15 VITALS — BP 132/82 | HR 75 | Wt 145.4 lb

## 2020-06-15 DIAGNOSIS — I471 Supraventricular tachycardia, unspecified: Secondary | ICD-10-CM | POA: Insufficient documentation

## 2020-06-15 DIAGNOSIS — I5042 Chronic combined systolic (congestive) and diastolic (congestive) heart failure: Secondary | ICD-10-CM

## 2020-06-15 DIAGNOSIS — R252 Cramp and spasm: Secondary | ICD-10-CM

## 2020-06-15 DIAGNOSIS — R569 Unspecified convulsions: Secondary | ICD-10-CM | POA: Diagnosis not present

## 2020-06-15 DIAGNOSIS — Z5181 Encounter for therapeutic drug level monitoring: Secondary | ICD-10-CM

## 2020-06-15 DIAGNOSIS — Z95810 Presence of automatic (implantable) cardiac defibrillator: Secondary | ICD-10-CM

## 2020-06-15 DIAGNOSIS — J438 Other emphysema: Secondary | ICD-10-CM

## 2020-06-15 DIAGNOSIS — F1010 Alcohol abuse, uncomplicated: Secondary | ICD-10-CM

## 2020-06-15 HISTORY — DX: Supraventricular tachycardia: I47.1

## 2020-06-15 HISTORY — DX: Supraventricular tachycardia, unspecified: I47.10

## 2020-06-15 LAB — BASIC METABOLIC PANEL
BUN/Creatinine Ratio: 11 (ref 9–20)
BUN: 10 mg/dL (ref 6–24)
CO2: 21 mmol/L (ref 20–29)
Calcium: 9.8 mg/dL (ref 8.7–10.2)
Chloride: 99 mmol/L (ref 96–106)
Creatinine, Ser: 0.87 mg/dL (ref 0.76–1.27)
Glucose: 64 mg/dL — ABNORMAL LOW (ref 65–99)
Potassium: 4.3 mmol/L (ref 3.5–5.2)
Sodium: 137 mmol/L (ref 134–144)
eGFR: 103 mL/min/{1.73_m2} (ref >59)

## 2020-06-15 LAB — MAGNESIUM: Magnesium: 1.8 mg/dL (ref 1.6–2.3)

## 2020-06-15 MED ORDER — ENTRESTO 97-103 MG PO TABS: 0.5000 | ORAL_TABLET | Freq: Two times a day (BID) | ORAL | 0 refills | Status: DC

## 2020-06-15 NOTE — Assessment & Plan Note (Signed)
He is having frequent episodes of SVT.  He was instructed to increase metoprolol but has only been taking 25 mg.  He notes that it makes him sleepy.  He reports a buzzing sensation in his chest that lasts for 10 to 15 minutes at a time.  I suspect that these may be his episodes of SVT.  He has not seen Dr. Elberta Fortis in a couple years.  I will have him follow-up with him to discuss other options if he is unable to titrate the metoprolol.

## 2020-06-15 NOTE — Assessment & Plan Note (Signed)
Managed by Dr. Elberta Fortis.  No VT but he has a high burden of SVT.

## 2020-06-15 NOTE — Telephone Encounter (Signed)
Almedia Balls from [facility] called in regards to the pt being prescribed Entresto 97-103 0.5 tablets perday, she wants to know if she can prescribe Entresto 49-51 0.5 tablets per day  [employer] [phone]

## 2020-06-15 NOTE — Assessment & Plan Note (Signed)
Encouraged smoking cessation.  He is unready to quit.
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
## 2020-06-15 NOTE — Assessment & Plan Note (Addendum)
He is euvolemic on exam.  His cardiomyopathy is thought to be due to alcohol abuse.  He had normal coronary arteries on cath.  LVEF was 20% on his most recent echo.  ICD is in place.  Cardiac output was normal and on his right heart cath 05/2019.  Continue metoprolol, Entresto, and furosemide.  Check a basic metabolic panel and magnesium today due to his report of cramping.

## 2020-06-15 NOTE — Patient Instructions (Addendum)
Medication Instructions:  Your physician recommends that you continue on your current medications as directed. Please refer to the Current Medication list given to you today.  *If you need a refill on your cardiac medications before your next appointment, please call your pharmacy*  Lab Work: BMET/MAGNESIUM TODAY   If you have labs (blood work) drawn today and your tests are completely normal, you will receive your results only by: Marland Kitchen MyChart Message (if you have MyChart) OR . A paper copy in the mail If you have any lab test that is abnormal or we need to change your treatment, we will call you to review the results.   Testing/Procedures: NONE   Follow-Up: At [facility], you and your health needs are our priority.  As part of our continuing mission to provide you with exceptional heart care, we have created designated Provider Care Teams.  These Care Teams include your primary Cardiologist (physician) and Advanced Practice Providers (APPs -  Physician Assistants and Nurse Practitioners) who all work together to provide you with the care you need, when you need it.  We recommend signing up for the patient portal called "MyChart".  Sign up information is provided on this After Visit Summary.  MyChart is used to connect with patients for Virtual Visits (Telemedicine).  Patients are able to view lab/test results, encounter notes, upcoming appointments, etc.  Non-urgent messages can be sent to your provider as well.   To learn more about what you can do with MyChart, go to [email or web address].    Your next appointment:   6 month(s)  The format for your next appointment:   In Person  Provider:   DR [facility] AT [facility] [place] [facility]   You have been referred to    Where: [facility] Address: [address] [place], [address] [place] [address] Phone: [phone]  IF YOU DO NOT HEAR FROM THE OFFICE IN 2 WEEKS CALL THEM DIRECTLY AT THE NUMBER ABOVE  Your  physician recommends that you schedule a follow-up appointment in: DR CAMNITZ

## 2020-06-15 NOTE — Telephone Encounter (Signed)
Spoke with pharmacy - they would like to know if MD will change entresto prescription to 49/51mg  BID instead of half-tablet of 97/103mg  BID. Patient was jsut seen in office today  Will route to [facility] MD and [facility]   Pharmacy said just send updated refill prescription - no call back needed

## 2020-06-15 NOTE — Telephone Encounter (Signed)
Spoke with patient and he takes a full tablet twice a day but he is unsure of strength Will call him Tuesday and follow up on mg, he is not currently home

## 2020-06-15 NOTE — Assessment & Plan Note (Signed)
Ongoing issue.  He was in the ED last month with seizures and intoxicated.

## 2020-06-19 NOTE — Telephone Encounter (Signed)
Spoke with patient regarding [name] He did look at the bottle and he has been taking Entresto 97-103 mg twice daily for at least 3 months, does not remember ever taking 1/2 Advised patient to continue full tablet twice a day  Will forward to Dr [name] to confirm dosage

## 2020-06-20 ENCOUNTER — Other Ambulatory Visit: Payer: Self-pay | Admitting: *Deleted

## 2020-06-20 DIAGNOSIS — R569 Unspecified convulsions: Secondary | ICD-10-CM

## 2020-06-20 MED ORDER — ENTRESTO 97-103 MG PO TABS: 1.0000 | ORAL_TABLET | Freq: Two times a day (BID) | ORAL | 3 refills | Status: DC

## 2020-06-20 MED ORDER — METOPROLOL SUCCINATE ER 100 MG PO TB24: 100.0000 mg | ORAL_TABLET | Freq: Every day | ORAL | 3 refills | Status: DC

## 2020-06-20 NOTE — Telephone Encounter (Signed)
Confirmed with patient he was indeed taking full tablet of Entresto 97-103 mg Rx sent to pharmacy   If he has been taking the full tablet he can continue to do so.  ----- Message -----  From: Burnell Blanks, LPN  Sent: 09/17/9369  5:32 PM EDT  To: Chilton Si, MD, Burnell Blanks, LPN    Patient has been taking Lasix 40 mg full tablet twice a day  He stated he has cramping at night and feels from urinating all day secondary to Lasix Was concerned potassium low, lab showed was 4.3   Advised would forward to Dr [name] for review

## 2020-06-20 NOTE — Progress Notes (Signed)
a 

## 2020-06-29 ENCOUNTER — Telehealth: Payer: Self-pay | Admitting: *Deleted

## 2020-06-29 NOTE — Telephone Encounter (Signed)
Received below message from covermyeds  Brodi Scioli Key: [id number]  help_outline NEED HELP? [phone] error Pharmacy claim for Entresto 97-103MG  tablets, prescriber Chilton Si, has been rejected and requires prior authorization for coverage.  Will forward to [facility] Via LPN to assist with PA

## 2020-07-02 NOTE — Telephone Encounter (Addendum)
**Note De-Identified  Obfuscation** I started a Entresto PA through covermymeds and received the following message: Knoxx Lezama Key: [id number] - PA Case ID: [id number] - Rx #: [id number] Outcome: Approved today Coverage Starts on: 07/02/2020 12:00:00 AM, Coverage Ends on: 07/02/2021 12:00:00 AM. Drug: Sherryll Burger 97-103 MG tablets Form: [employer] [facility] Electronic [place] Form   I have notified [facility] of this approval.

## 2020-07-02 NOTE — Telephone Encounter (Signed)
Left message to call back  

## 2020-07-04 ENCOUNTER — Other Ambulatory Visit: Payer: Self-pay | Admitting: Family Medicine

## 2020-07-04 DIAGNOSIS — R059 Cough, unspecified: Secondary | ICD-10-CM

## 2020-07-04 NOTE — Telephone Encounter (Signed)
Future visit scheduled in 2 months .

## 2020-08-07 ENCOUNTER — Ambulatory Visit (INDEPENDENT_AMBULATORY_CARE_PROVIDER_SITE_OTHER): Payer: Medicaid Other | Admitting: Cardiology

## 2020-08-07 ENCOUNTER — Encounter: Payer: Self-pay | Admitting: Cardiology

## 2020-08-07 ENCOUNTER — Other Ambulatory Visit: Payer: Self-pay

## 2020-08-07 VITALS — BP 144/102 | HR 81 | Ht 68.0 in | Wt 144.0 lb

## 2020-08-07 DIAGNOSIS — I5022 Chronic systolic (congestive) heart failure: Secondary | ICD-10-CM | POA: Diagnosis not present
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
## 2020-08-07 NOTE — Patient Instructions (Signed)
Medication Instructions:  Your physician recommends that you continue on your current medications as directed. Please refer to the Current Medication list given to you today.  *If you need a refill on your cardiac medications before your next appointment, please call your pharmacy*   Lab Work: None ordered   Testing/Procedures: None ordered   Follow-Up: At [facility], you and your health needs are our priority.  As part of our continuing mission to provide you with exceptional heart care, we have created designated Provider Care Teams.  These Care Teams include your primary Cardiologist (physician) and Advanced Practice Providers (APPs -  Physician Assistants and Nurse Practitioners) who all work together to provide you with the care you need, when you need it.  We recommend signing up for the patient portal called "MyChart".  Sign up information is provided on this After Visit Summary.  MyChart is used to connect with patients for Virtual Visits (Telemedicine).  Patients are able to view lab/test results, encounter notes, upcoming appointments, etc.  Non-urgent messages can be sent to your provider as well.   To learn more about what you can do with MyChart, go to [email or web address].    Remote monitoring is used to monitor your Pacemaker or ICD from home. This monitoring reduces the number of office visits required to check your device to one time per year. It allows [name] to keep an eye on the functioning of your device to ensure it is working properly. You are scheduled for a device check from home on 08/13/2020. You may send your transmission at any time that day. If you have a wireless device, the transmission will be sent automatically. After your physician reviews your transmission, you will receive a postcard with your next transmission date.  Your next appointment:   1 year(s)  The format for your next appointment:   In Person  Provider:   Loman Brooklyn, MD   Thank you  for choosing [facility]!!   Dory Horn, RN [phone]

## 2020-08-13 ENCOUNTER — Ambulatory Visit (INDEPENDENT_AMBULATORY_CARE_PROVIDER_SITE_OTHER): Payer: Medicaid Other

## 2020-08-13 DIAGNOSIS — I428 Other cardiomyopathies: Secondary | ICD-10-CM | POA: Diagnosis not present

## 2020-08-14 LAB — CUP PACEART REMOTE DEVICE CHECK
Battery Remaining Longevity: 77 mo
Battery Remaining Percentage: 77 %
Battery Voltage: 2.99 V
Brady Statistic RV Percent Paced: 0 %
Date Time Interrogation Session: 20220725054040
HighPow Impedance: 64 Ohm
HighPow Impedance: 64 Ohm
Implantable Lead Implant Date: 20191230
Implantable Lead Implant Date: 20191230
Implantable Lead Location: 753860
Implantable Lead Location: 753860
Implantable Lead Model: 7122
Implantable Lead Model: 7122
Implantable Pulse Generator Implant Date: 20191230
Lead Channel Impedance Value: 490 Ohm
Lead Channel Pacing Threshold Amplitude: 0.5 V
Lead Channel Pacing Threshold Pulse Width: 0.5 ms
Lead Channel Sensing Intrinsic Amplitude: 11.6 mV
Lead Channel Setting Pacing Amplitude: 2.5 V
Lead Channel Setting Pacing Pulse Width: 0.5 ms
Lead Channel Setting Sensing Sensitivity: 0.5 mV
Pulse Gen Serial Number: 9850980

## 2020-08-28 ENCOUNTER — Telehealth: Payer: Self-pay

## 2020-08-28 MED ORDER — METOPROLOL SUCCINATE ER 100 MG PO TB24: 150.0000 mg | ORAL_TABLET | Freq: Every day | ORAL | 2 refills | Status: DC

## 2020-08-28 NOTE — Telephone Encounter (Signed)
Pt aware of remote check results and recommendations. He will increase his Metoprolol Succinate to 150mg  (1.5 tabs) qhs. He will report any new side effects to the office.

## 2020-08-28 NOTE — Telephone Encounter (Signed)
-----   Message from Will Jorja Loa, MD sent at 08/17/2020  2:25 PM EDT ----- Normal remote reviewed. Battery and lead parameters stable. SVT episodes. Increase toprol xl to 150 mg.

## 2020-09-04 NOTE — Progress Notes (Signed)
Remote ICD transmission.   

## 2020-09-10 ENCOUNTER — Ambulatory Visit: Payer: Medicaid Other | Attending: Family Medicine | Admitting: Family Medicine

## 2020-09-10 ENCOUNTER — Encounter: Payer: Self-pay | Admitting: Family Medicine

## 2020-09-10 ENCOUNTER — Other Ambulatory Visit: Payer: Self-pay

## 2020-09-10 VITALS — BP 139/91 | HR 79 | Ht 68.0 in | Wt 142.2 lb

## 2020-09-10 DIAGNOSIS — J438 Other emphysema: Secondary | ICD-10-CM | POA: Insufficient documentation

## 2020-09-10 DIAGNOSIS — I5042 Chronic combined systolic (congestive) and diastolic (congestive) heart failure: Secondary | ICD-10-CM | POA: Insufficient documentation

## 2020-09-10 DIAGNOSIS — Z8249 Family history of ischemic heart disease and other diseases of the circulatory system: Secondary | ICD-10-CM | POA: Diagnosis not present

## 2020-09-10 DIAGNOSIS — Z8349 Family history of other endocrine, nutritional and metabolic diseases: Secondary | ICD-10-CM | POA: Insufficient documentation

## 2020-09-10 DIAGNOSIS — I471 Supraventricular tachycardia: Secondary | ICD-10-CM | POA: Insufficient documentation

## 2020-09-10 DIAGNOSIS — Z95810 Presence of automatic (implantable) cardiac defibrillator: Secondary | ICD-10-CM | POA: Insufficient documentation

## 2020-09-10 DIAGNOSIS — Z720 Tobacco use: Secondary | ICD-10-CM | POA: Diagnosis not present

## 2020-09-10 DIAGNOSIS — Z7982 Long term (current) use of aspirin: Secondary | ICD-10-CM | POA: Insufficient documentation

## 2020-09-10 DIAGNOSIS — Z880 Allergy status to penicillin: Secondary | ICD-10-CM | POA: Diagnosis not present

## 2020-09-10 DIAGNOSIS — Z79899 Other long term (current) drug therapy: Secondary | ICD-10-CM | POA: Insufficient documentation

## 2020-09-10 DIAGNOSIS — F17210 Nicotine dependence, cigarettes, uncomplicated: Secondary | ICD-10-CM | POA: Diagnosis not present

## 2020-09-10 DIAGNOSIS — I110 Hypertensive heart disease with heart failure: Secondary | ICD-10-CM | POA: Diagnosis not present

## 2020-09-10 DIAGNOSIS — I10 Essential (primary) hypertension: Secondary | ICD-10-CM | POA: Diagnosis not present

## 2020-09-10 DIAGNOSIS — I426 Alcoholic cardiomyopathy: Secondary | ICD-10-CM | POA: Insufficient documentation

## 2020-09-10 DIAGNOSIS — F1010 Alcohol abuse, uncomplicated: Secondary | ICD-10-CM | POA: Insufficient documentation

## 2020-09-10 MED ORDER — SPIRIVA HANDIHALER 18 MCG IN CAPS: 18.0000 ug | ORAL_CAPSULE | Freq: Every day | RESPIRATORY_TRACT | 6 refills | Status: DC

## 2020-09-10 MED ORDER — ALBUTEROL SULFATE HFA 108 (90 BASE) MCG/ACT IN AERS: 2.0000 | INHALATION_SPRAY | Freq: Four times a day (QID) | RESPIRATORY_TRACT | 6 refills | Status: DC | PRN

## 2020-09-10 NOTE — Patient Instructions (Signed)
Managing the Challenge of Quitting Smoking Quitting smoking is a physical and mental challenge. You will face cravings, withdrawal symptoms, and temptation. Before quitting, work with your health care provider to make a plan that can help you manage quitting. Preparation canhelp you quit and keep you from giving in. How to manage lifestyle changes Managing stress Stress can make you want to smoke, and wanting to smoke may cause stress. It is important to find ways to manage your stress. You might try some of the following: Practice relaxation techniques. Breathe slowly and deeply, in through your nose and out through your mouth. Listen to music. Soak in a bath or take a shower. Imagine a peaceful place or vacation. Get some support. Talk with family or friends about your stress. Join a support group. Talk with a counselor or therapist. Get some physical activity. Go for a walk, run, or bike ride. Play a favorite sport. Practice yoga.  Medicines Talk with your health care provider about medicines that might help you dealwith cravings and make quitting easier for you. Relationships Social situations can be difficult when you are quitting smoking. To manage this, you can: Avoid parties and other social situations where people might be smoking. Avoid alcohol. Leave right away if you have the urge to smoke. Explain to your family and friends that you are quitting smoking. Ask for support and let them know you might be a bit grumpy. Plan activities where smoking is not an option. General instructions Be aware that many people gain weight after they quit smoking. However, not everyone does. To keep from gaining weight, have a plan in place before you quit and stick to the plan after you quit. Your plan should include: Having healthy snacks. When you have a craving, it may help to: Eat popcorn, carrots, celery, or other cut vegetables. Chew sugar-free gum. Changing how you eat. Eat small  portion sizes at meals. Eat 4-6 small meals throughout the day instead of 1-2 large meals a day. Be mindful when you eat. Do not watch television or do other things that might distract you as you eat. Exercising regularly. Make time to exercise each day. If you do not have time for a long workout, do short bouts of exercise for 5-10 minutes several times a day. Do some form of strengthening exercise, such as weight lifting. Do some exercise that gets your heart beating and causes you to breathe deeply, such as walking fast, running, swimming, or biking. This is very important. Drinking plenty of water or other low-calorie or no-calorie drinks. Drink 6-8 glasses of water daily.  How to recognize withdrawal symptoms Your body and mind may experience discomfort as you try to get used to not having nicotine in your system. These effects are called withdrawal symptoms. They may include: Feeling hungrier than normal. Having trouble concentrating. Feeling irritable or restless. Having trouble sleeping. Feeling depressed. Craving a cigarette. To manage withdrawal symptoms: Avoid places, people, and activities that trigger your cravings. Remember why you want to quit. Get plenty of sleep. Avoid coffee and other caffeinated drinks. These may worsen some of your symptoms. These symptoms may surprise you. But be assured that they are normal to havewhen quitting smoking. How to manage cravings Come up with a plan for how to deal with your cravings. The plan should include the following: A definition of the specific situation you want to deal with. An alternative action you will take. A clear idea for how this action will help. The   name of someone who might help you with this. Cravings usually last for 5-10 minutes. Consider taking the following actions to help you with your plan to deal with cravings: Keep your mouth busy. Chew sugar-free gum. Suck on hard candies or a straw. Brush your  teeth. Keep your hands and body busy. Change to a different activity right away. Squeeze or play with a ball. Do an activity or a hobby, such as making bead jewelry, practicing needlepoint, or working with wood. Mix up your normal routine. Take a short exercise break. Go for a quick walk or run up and down stairs. Focus on doing something kind or helpful for someone else. Call a friend or family member to talk during a craving. Join a support group. Contact a quitline. Where to find support To get help or find a support group: Call the National Cancer Institute's Smoking Quitline: 1-800-QUIT NOW (784-8669) Visit the website of the Substance Abuse and Mental Health Services Administration: www.samhsa.gov Text QUIT to SmokefreeTXT: 478848 Where to find more information Visit these websites to find more information on quitting smoking: National Cancer Institute: www.smokefree.gov American Lung Association: www.lung.org American Cancer Society: www.cancer.org Centers for Disease Control and Prevention: www.cdc.gov American Heart Association: www.heart.org Contact a health care provider if: You want to change your plan for quitting. The medicines you are taking are not helping. Your eating feels out of control or you cannot sleep. Get help right away if: You feel depressed or become very anxious. Summary Quitting smoking is a physical and mental challenge. You will face cravings, withdrawal symptoms, and temptation to smoke again. Preparation can help you as you go through these challenges. Try different techniques to manage stress, handle social situations, and prevent weight gain. You can deal with cravings by keeping your mouth busy (such as by chewing gum), keeping your hands and body busy, calling family or friends, or contacting a quitline for people who want to quit smoking. You can deal with withdrawal symptoms by avoiding places where people smoke, getting plenty of rest, and  avoiding drinks with caffeine. This information is not intended to replace advice given to you by your health care provider. Make sure you discuss any questions you have with your healthcare provider. Document Revised: 10/26/2018 Document Reviewed: 10/26/2018 Elsevier Patient Education  2022 [employer]vier Inc.  

## 2020-09-10 NOTE — Progress Notes (Signed)
Needs medication refills.
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
## 2020-10-08 ENCOUNTER — Encounter: Payer: Self-pay | Admitting: Neurology

## 2020-10-08 ENCOUNTER — Ambulatory Visit: Payer: Medicaid Other | Admitting: Neurology

## 2020-11-12 ENCOUNTER — Ambulatory Visit (INDEPENDENT_AMBULATORY_CARE_PROVIDER_SITE_OTHER): Payer: Medicaid Other

## 2020-11-12 DIAGNOSIS — I428 Other cardiomyopathies: Secondary | ICD-10-CM | POA: Diagnosis not present

## 2020-11-12 LAB — CUP PACEART REMOTE DEVICE CHECK
Battery Remaining Longevity: 76 mo
Battery Remaining Percentage: 75 %
Battery Voltage: 2.98 V
Brady Statistic RV Percent Paced: 0 %
Date Time Interrogation Session: 20221024020017
HighPow Impedance: 63 Ohm
HighPow Impedance: 63 Ohm
Implantable Lead Implant Date: 20191230
Implantable Lead Implant Date: 20191230
Implantable Lead Location: 753860
Implantable Lead Location: 753860
Implantable Lead Model: 7122
Implantable Lead Model: 7122
Implantable Pulse Generator Implant Date: 20191230
Lead Channel Impedance Value: 490 Ohm
Lead Channel Pacing Threshold Amplitude: 0.5 V
Lead Channel Pacing Threshold Pulse Width: 0.5 ms
Lead Channel Sensing Intrinsic Amplitude: 11.6 mV
Lead Channel Setting Pacing Amplitude: 2.5 V
Lead Channel Setting Pacing Pulse Width: 0.5 ms
Lead Channel Setting Sensing Sensitivity: 0.5 mV
Pulse Gen Serial Number: 9850980

## 2020-11-20 ENCOUNTER — Other Ambulatory Visit: Payer: Self-pay | Admitting: Family Medicine

## 2020-11-20 NOTE — Telephone Encounter (Signed)
Requested medication (s) are due for refill today: Yes  Requested medication (s) are on the active medication list: Yes  Last refill:  05/22/20  Future visit scheduled: No  Notes to clinic:  Unable to refill per protocol, last refill by another provider.      Requested Prescriptions  Pending Prescriptions Disp Refills   furosemide (LASIX) 40 MG tablet 45 tablet 0    Sig: TAKE 1/2 TABLET IN AM AND 1 MIDDAY KEEP APPOINTMENT 5/27     Cardiovascular:  Diuretics - Loop Failed - 11/20/2020  1:01 PM      Failed - Last BP in normal range    BP Readings from Last 1 Encounters:  09/10/20 (!) 139/91          Passed - K in normal range and within 360 days    Potassium  Date Value Ref Range Status  06/15/2020 4.3 3.5 - 5.2 mmol/L Final          Passed - Ca in normal range and within 360 days    Calcium  Date Value Ref Range Status  06/15/2020 9.8 8.7 - 10.2 mg/dL Final   Calcium, Ion  Date Value Ref Range Status  06/03/2019 1.35 1.15 - 1.40 mmol/L Final          Passed - Na in normal range and within 360 days    Sodium  Date Value Ref Range Status  06/15/2020 137 134 - 144 mmol/L Final          Passed - Cr in normal range and within 360 days    Creat  Date Value Ref Range Status  03/20/2016 0.85 0.70 - 1.33 mg/dL Final    Comment:      For patients > or = 55 years of age: The upper reference limit for Creatinine is approximately 13% higher for people identified as African-American.      Creatinine, Ser  Date Value Ref Range Status  06/15/2020 0.87 0.76 - 1.27 mg/dL Final          Passed - Valid encounter within last 6 months    Recent Outpatient Visits           2 months ago Alcoholic cardiomyopathy [facility])   [facility] Hoy Register, MD   1 year ago Slurred speech   [facility] Storm Frisk, MD   1 year ago NICM (nonischemic cardiomyopathy) [facility])   [facility]  Storm Frisk, MD   1 year ago Hypertensive heart disease with chronic systolic congestive heart failure [facility])   [facility] Hoy Register, MD   2 years ago Muscle cramp   [facility] Hoy Register, MD

## 2020-11-20 NOTE — Telephone Encounter (Signed)
Copied from CRM [id number]. Topic: Quick Communication - Rx Refill/Question >> Nov 20, 2020 12:24 PM Marylen Ponto wrote: Medication: furosemide (LASIX) 40 MG tablet  Has the patient contacted their pharmacy? Yes.  Pt also told to contact provider as it may be faster (Agent: If no, request that the patient contact the pharmacy for the refill. If patient does not wish to contact the pharmacy document the reason why and proceed with request.) (Agent: If yes, when and what did the pharmacy advise?)  Preferred Pharmacy (with phone number or street name): [facility] - [place], [place] - [address] Phone: [phone]   Fax: [phone]  Has the patient been seen for an appointment in the last year OR does the patient have an upcoming appointment? Yes.    Agent: Please be advised that RX refills may take up to 3 business days. We ask that you follow-up with your pharmacy.

## 2020-11-20 NOTE — Telephone Encounter (Signed)
Prescriber not at this practice- routed to Dr Chilton Si

## 2020-11-20 NOTE — Progress Notes (Signed)
Remote ICD transmission.   

## 2020-12-28 ENCOUNTER — Telehealth: Payer: Self-pay

## 2020-12-28 NOTE — Telephone Encounter (Signed)
Successful telephone encounter to patient to follow up on multiple daily manual transmissions. Patient states he contacted [employer] two weeks ago because his remote monitor "alarms and flashes lights" and was told his monitor may be malfunctioning. He was told he would be sent a new monitor. Has not arrived to date. He will call [employer] to follow up.

## 2021-02-05 ENCOUNTER — Telehealth: Payer: Self-pay | Admitting: Cardiology

## 2021-02-05 ENCOUNTER — Other Ambulatory Visit: Payer: Self-pay | Admitting: Cardiovascular Disease

## 2021-02-05 MED ORDER — FUROSEMIDE 40 MG PO TABS: ORAL_TABLET | ORAL | 1 refills | Status: DC

## 2021-02-05 MED ORDER — METOPROLOL SUCCINATE ER 100 MG PO TB24: 150.0000 mg | ORAL_TABLET | Freq: Every day | ORAL | 1 refills | Status: DC

## 2021-02-05 NOTE — Telephone Encounter (Signed)
°*  STAT* If patient is at the pharmacy, call can be transferred to refill team.   1. Which medications need to be refilled? (please list name of each medication and dose if known)  furosemide (LASIX) 40 MG tablet metoprolol succinate (TOPROL-XL) 100 MG 24 hr tablet  2. Which pharmacy/location (including street and city if local pharmacy) is medication to be sent to? [facility] - [place], [place] - [address]  3. Do they need a 30 day or 90 day supply? 90 day   Patient has 5 more days left

## 2021-02-05 NOTE — Telephone Encounter (Signed)
Pt's medications were sent to pt's pharmacy as requested. Confirmation received.  

## 2021-02-06 ENCOUNTER — Telehealth: Payer: Self-pay | Admitting: Cardiovascular Disease

## 2021-02-06 NOTE — Telephone Encounter (Signed)
Can you please help this patient with PA For entresto

## 2021-02-06 NOTE — Telephone Encounter (Signed)
PA attempted in CoverMyMeds. Please see below:  Deirdre Peer Key: [facility]  Outcome Additional Information Required Available without authorization.  Drug Entresto 97-103MG  tablets  Form [employer] Electronic [place] Form [phone] NCPDP)

## 2021-02-06 NOTE — Telephone Encounter (Signed)
Phone encounter for [employer], routed to [employer]

## 2021-02-06 NOTE — Telephone Encounter (Signed)
Pt c/o medication issue:  1. Name of Medication:  sacubitril-valsartan (ENTRESTO) 97-103 MG  2. How are you currently taking this medication (dosage and times per day)? As prescribed   3. Are you having a reaction (difficulty breathing--STAT)? No   4. What is your medication issue? Patient called in regards to his metoprolol and lasix stating the pharmacy advised him today they had not received any refills from the office. I called the pharmacy while the patient was on the phone and confirmed the prescriptions were received and were out for delivery to arrive to the patient today. While confirming this with the pharmacy they advised PA is needed for this medication. Please advise.

## 2021-02-07 NOTE — Telephone Encounter (Signed)
Called patient to inform him that he does not need prior auth for his Sherryll Burger     PA attempted in CoverMyMeds. Please see below:   Deirdre Peer Key: [facility]   Outcome Additional Information Required Available without authorization.   Drug Entresto 97-103MG  tablets   Form [employer] Electronic [place] Form [phone] NCPDP)

## 2021-02-11 ENCOUNTER — Ambulatory Visit (INDEPENDENT_AMBULATORY_CARE_PROVIDER_SITE_OTHER): Payer: Medicaid Other

## 2021-02-11 DIAGNOSIS — I428 Other cardiomyopathies: Secondary | ICD-10-CM | POA: Diagnosis not present

## 2021-02-11 LAB — CUP PACEART REMOTE DEVICE CHECK
Battery Remaining Longevity: 73 mo
Battery Remaining Percentage: 73 %
Battery Voltage: 2.98 V
Brady Statistic RV Percent Paced: 1 %
Date Time Interrogation Session: 20230123020017
HighPow Impedance: 53 Ohm
HighPow Impedance: 53 Ohm
Implantable Lead Implant Date: 20191230
Implantable Lead Implant Date: 20191230
Implantable Lead Location: 753860
Implantable Lead Location: 753860
Implantable Lead Model: 7122
Implantable Lead Model: 7122
Implantable Pulse Generator Implant Date: 20191230
Lead Channel Impedance Value: 450 Ohm
Lead Channel Pacing Threshold Amplitude: 0.5 V
Lead Channel Pacing Threshold Pulse Width: 0.5 ms
Lead Channel Sensing Intrinsic Amplitude: 11.6 mV
Lead Channel Setting Pacing Amplitude: 2.5 V
Lead Channel Setting Pacing Pulse Width: 0.5 ms
Lead Channel Setting Sensing Sensitivity: 0.5 mV
Pulse Gen Serial Number: 9850980
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
## 2021-02-12 ENCOUNTER — Ambulatory Visit (HOSPITAL_BASED_OUTPATIENT_CLINIC_OR_DEPARTMENT_OTHER): Payer: Medicaid Other | Admitting: Nurse Practitioner

## 2021-02-21 NOTE — Progress Notes (Signed)
Remote ICD transmission.   

## 2021-03-05 ENCOUNTER — Encounter (HOSPITAL_COMMUNITY): Payer: Self-pay

## 2021-03-05 ENCOUNTER — Emergency Department (HOSPITAL_COMMUNITY): Payer: Medicaid Other

## 2021-03-05 ENCOUNTER — Other Ambulatory Visit: Payer: Self-pay

## 2021-03-05 ENCOUNTER — Inpatient Hospital Stay (HOSPITAL_COMMUNITY)
Admission: EM | Admit: 2021-03-05 | Discharge: 2021-03-10 | DRG: 291 | Disposition: A | Discharge status: Home / Self Care | Payer: Medicaid Other | Attending: Internal Medicine | Admitting: Internal Medicine

## 2021-03-05 ENCOUNTER — Observation Stay (HOSPITAL_COMMUNITY): Payer: Medicaid Other

## 2021-03-05 DIAGNOSIS — N179 Acute kidney failure, unspecified: Secondary | ICD-10-CM | POA: Diagnosis not present

## 2021-03-05 DIAGNOSIS — F1010 Alcohol abuse, uncomplicated: Secondary | ICD-10-CM | POA: Diagnosis present

## 2021-03-05 DIAGNOSIS — I272 Pulmonary hypertension, unspecified: Secondary | ICD-10-CM | POA: Diagnosis present

## 2021-03-05 DIAGNOSIS — R0602 Shortness of breath: Secondary | ICD-10-CM | POA: Diagnosis not present

## 2021-03-05 DIAGNOSIS — I5043 Acute on chronic combined systolic (congestive) and diastolic (congestive) heart failure: Secondary | ICD-10-CM | POA: Diagnosis not present

## 2021-03-05 DIAGNOSIS — I110 Hypertensive heart disease with heart failure: Principal | ICD-10-CM | POA: Diagnosis present

## 2021-03-05 DIAGNOSIS — I447 Left bundle-branch block, unspecified: Secondary | ICD-10-CM | POA: Diagnosis not present

## 2021-03-05 DIAGNOSIS — Z8249 Family history of ischemic heart disease and other diseases of the circulatory system: Secondary | ICD-10-CM

## 2021-03-05 DIAGNOSIS — Z95810 Presence of automatic (implantable) cardiac defibrillator: Secondary | ICD-10-CM

## 2021-03-05 DIAGNOSIS — Z20822 Contact with and (suspected) exposure to covid-19: Secondary | ICD-10-CM | POA: Diagnosis present

## 2021-03-05 DIAGNOSIS — I5041 Acute combined systolic (congestive) and diastolic (congestive) heart failure: Secondary | ICD-10-CM

## 2021-03-05 DIAGNOSIS — I428 Other cardiomyopathies: Secondary | ICD-10-CM | POA: Diagnosis present

## 2021-03-05 DIAGNOSIS — I69349 Monoplegia of lower limb following cerebral infarction affecting unspecified side: Secondary | ICD-10-CM

## 2021-03-05 DIAGNOSIS — I509 Heart failure, unspecified: Secondary | ICD-10-CM

## 2021-03-05 DIAGNOSIS — J449 Chronic obstructive pulmonary disease, unspecified: Secondary | ICD-10-CM | POA: Diagnosis not present

## 2021-03-05 DIAGNOSIS — I517 Cardiomegaly: Secondary | ICD-10-CM | POA: Diagnosis not present

## 2021-03-05 DIAGNOSIS — Z9114 Patient's other noncompliance with medication regimen: Secondary | ICD-10-CM

## 2021-03-05 DIAGNOSIS — F17210 Nicotine dependence, cigarettes, uncomplicated: Secondary | ICD-10-CM | POA: Diagnosis present

## 2021-03-05 DIAGNOSIS — Z79899 Other long term (current) drug therapy: Secondary | ICD-10-CM

## 2021-03-05 DIAGNOSIS — I5082 Biventricular heart failure: Secondary | ICD-10-CM | POA: Diagnosis present

## 2021-03-05 DIAGNOSIS — J9811 Atelectasis: Secondary | ICD-10-CM | POA: Diagnosis not present

## 2021-03-05 DIAGNOSIS — Z7982 Long term (current) use of aspirin: Secondary | ICD-10-CM

## 2021-03-05 DIAGNOSIS — I10 Essential (primary) hypertension: Secondary | ICD-10-CM | POA: Diagnosis not present

## 2021-03-05 LAB — BASIC METABOLIC PANEL
Anion gap: 16 — ABNORMAL HIGH (ref 5–15)
BUN: 16 mg/dL (ref 6–20)
CO2: 25 mmol/L (ref 22–32)
Calcium: 9.6 mg/dL (ref 8.9–10.3)
Chloride: 98 mmol/L (ref 98–111)
Creatinine, Ser: 1.34 mg/dL — ABNORMAL HIGH (ref 0.61–1.24)
GFR, Estimated: >60 mL/min (ref >60)
Glucose, Bld: 125 mg/dL — ABNORMAL HIGH (ref 70–99)
Potassium: 4.1 mmol/L (ref 3.5–5.1)
Sodium: 139 mmol/L (ref 135–145)

## 2021-03-05 LAB — CBC WITH DIFFERENTIAL/PLATELET
Abs Immature Granulocytes: 0.03 10*3/uL (ref 0.00–0.07)
Basophils Absolute: 0.1 10*3/uL (ref 0.0–0.1)
Basophils Relative: 1 %
Eosinophils Absolute: 0 10*3/uL (ref 0.0–0.5)
Eosinophils Relative: 0 %
HCT: 40.1 % (ref 39.0–52.0)
Hemoglobin: 13.6 g/dL (ref 13.0–17.0)
Immature Granulocytes: 0 %
Lymphocytes Relative: 8 %
Lymphs Abs: 0.7 10*3/uL (ref 0.7–4.0)
MCH: 34.5 pg — ABNORMAL HIGH (ref 26.0–34.0)
MCHC: 33.9 g/dL (ref 30.0–36.0)
MCV: 101.8 fL — ABNORMAL HIGH (ref 80.0–100.0)
Monocytes Absolute: 0.7 10*3/uL (ref 0.1–1.0)
Monocytes Relative: 8 %
Neutro Abs: 7.5 10*3/uL (ref 1.7–7.7)
Neutrophils Relative %: 83 %
Platelets: 170 10*3/uL (ref 150–400)
RBC: 3.94 MIL/uL — ABNORMAL LOW (ref 4.22–5.81)
RDW: 12.4 % (ref 11.5–15.5)
WBC: 9 10*3/uL (ref 4.0–10.5)
nRBC: 0 % (ref 0.0–0.2)

## 2021-03-05 LAB — HIV ANTIBODY (ROUTINE TESTING W REFLEX): HIV Screen 4th Generation wRfx: NONREACTIVE

## 2021-03-05 LAB — TROPONIN I (HIGH SENSITIVITY)
Troponin I (High Sensitivity): 47 ng/L — ABNORMAL HIGH (ref <18)
Troponin I (High Sensitivity): 47 ng/L — ABNORMAL HIGH (ref <18)

## 2021-03-05 LAB — MAGNESIUM: Magnesium: 1.6 mg/dL — ABNORMAL LOW (ref 1.7–2.4)

## 2021-03-05 LAB — HEPATIC FUNCTION PANEL
ALT: 18 U/L (ref 0–44)
AST: 29 U/L (ref 15–41)
Albumin: 4.3 g/dL (ref 3.5–5.0)
Alkaline Phosphatase: 77 U/L (ref 38–126)
Bilirubin, Direct: 0.4 mg/dL — ABNORMAL HIGH (ref 0.0–0.2)
Indirect Bilirubin: 2 mg/dL — ABNORMAL HIGH (ref 0.3–0.9)
Total Bilirubin: 2.4 mg/dL — ABNORMAL HIGH (ref 0.3–1.2)
Total Protein: 7.4 g/dL (ref 6.5–8.1)

## 2021-03-05 LAB — HEMOGLOBIN A1C
Hgb A1c MFr Bld: 5 % (ref 4.8–5.6)
Mean Plasma Glucose: 96.8 mg/dL

## 2021-03-05 LAB — RESP PANEL BY RT-PCR (FLU A&B, COVID) ARPGX2
Influenza A by PCR: NEGATIVE
Influenza B by PCR: NEGATIVE
SARS Coronavirus 2 by RT PCR: NEGATIVE

## 2021-03-05 LAB — VITAMIN B12: Vitamin B-12: 446 pg/mL (ref 180–914)

## 2021-03-05 LAB — BRAIN NATRIURETIC PEPTIDE: B Natriuretic Peptide: >4500 pg/mL — ABNORMAL HIGH (ref 0.0–100.0)

## 2021-03-05 LAB — D-DIMER, QUANTITATIVE: D-Dimer, Quant: 1.1 ug/mL-FEU — ABNORMAL HIGH (ref 0.00–0.50)

## 2021-03-05 LAB — FOLATE: Folate: 20.3 ng/mL (ref >5.9)

## 2021-03-05 MED ORDER — METOPROLOL TARTRATE 25 MG PO TABS
37.5000 mg | ORAL_TABLET | Freq: Four times a day (QID) | ORAL | Status: AC
  Administered 2021-03-05 – 2021-03-06 (×6): 37.5 mg via ORAL
  Filled 2021-03-05 (×3): qty 1
  Filled 2021-03-05 (×2): qty 2
  Filled 2021-03-05: qty 1

## 2021-03-05 MED ORDER — ACETAMINOPHEN 325 MG PO TABS: 650.0000 mg | ORAL_TABLET | Freq: Four times a day (QID) | ORAL | Status: DC | PRN

## 2021-03-05 MED ORDER — THIAMINE HCL 100 MG PO TABS
100.0000 mg | ORAL_TABLET | Freq: Every day | ORAL | Status: DC
  Administered 2021-03-05 – 2021-03-10 (×6): 100 mg via ORAL
  Filled 2021-03-05 (×6): qty 1

## 2021-03-05 MED ORDER — IOHEXOL 350 MG/ML SOLN
100.0000 mL | Freq: Once | INTRAVENOUS | Status: AC | PRN
  Administered 2021-03-05: 100 mL via INTRAVENOUS

## 2021-03-05 MED ORDER — FOLIC ACID 1 MG PO TABS
1.0000 mg | ORAL_TABLET | Freq: Every day | ORAL | Status: DC
  Administered 2021-03-05 – 2021-03-10 (×6): 1 mg via ORAL
  Filled 2021-03-05 (×6): qty 1

## 2021-03-05 MED ORDER — ADULT MULTIVITAMIN W/MINERALS CH
1.0000 | ORAL_TABLET | Freq: Every day | ORAL | Status: DC
  Administered 2021-03-05 – 2021-03-10 (×6): 1 via ORAL
  Filled 2021-03-05 (×6): qty 1

## 2021-03-05 MED ORDER — SENNOSIDES-DOCUSATE SODIUM 8.6-50 MG PO TABS: 1.0000 | ORAL_TABLET | Freq: Every evening | ORAL | Status: DC | PRN

## 2021-03-05 MED ORDER — ACETAMINOPHEN 650 MG RE SUPP: 650.0000 mg | Freq: Four times a day (QID) | RECTAL | Status: DC | PRN

## 2021-03-05 MED ORDER — FUROSEMIDE 10 MG/ML IJ SOLN
60.0000 mg | Freq: Once | INTRAMUSCULAR | Status: AC
  Administered 2021-03-05: 60 mg via INTRAVENOUS
  Filled 2021-03-05: qty 6

## 2021-03-05 MED ORDER — HEPARIN SODIUM (PORCINE) 5000 UNIT/ML IJ SOLN: 5000.0000 [IU] | Freq: Three times a day (TID) | INTRAMUSCULAR | Status: DC

## 2021-03-05 MED ORDER — THIAMINE HCL 100 MG/ML IJ SOLN
100.0000 mg | Freq: Every day | INTRAMUSCULAR | Status: DC
  Filled 2021-03-05: qty 2

## 2021-03-05 MED ORDER — ALBUTEROL SULFATE (2.5 MG/3ML) 0.083% IN NEBU: 2.5000 mg | INHALATION_SOLUTION | RESPIRATORY_TRACT | Status: DC | PRN

## 2021-03-05 MED ORDER — SACUBITRIL-VALSARTAN 97-103 MG PO TABS: 1.0000 | ORAL_TABLET | Freq: Two times a day (BID) | ORAL | Status: DC

## 2021-03-05 MED ORDER — ENOXAPARIN SODIUM 40 MG/0.4ML IJ SOSY
40.0000 mg | PREFILLED_SYRINGE | INTRAMUSCULAR | Status: DC
  Administered 2021-03-06 – 2021-03-09 (×4): 40 mg via SUBCUTANEOUS
  Filled 2021-03-05 (×5): qty 0.4

## 2021-03-05 NOTE — ED Notes (Signed)
Patient transported to Ultrasound 

## 2021-03-05 NOTE — ED Notes (Signed)
Pt provided w/ a urinal and aware we need a sample.

## 2021-03-05 NOTE — ED Provider Triage Note (Signed)
Emergency Medicine Provider Triage Evaluation Note  [name] , a 56 y.o. male  was evaluated in triage.  Pt with history of alcohol abuse, hypertension, tobacco abuse, COPD, heart failure who presents with shortness of breath for the last 3 days.  Patient reports that he takes Lasix 40 mg reports that it has been working like usual, he has not been urinating as much, feels some pressure on his chest.  Patient reports that he has been trying to make himself throw up to decrease the pressure.  Patient also endorses some fever and chills.  Patient denies any chest pain, headache, leg swelling  Review of Systems  Positive: Shob, fever chills Negative: Chest pain, headache, nausea, abdominal pai  Physical Exam  BP (!) 163/123 (BP Location: Right Arm)    Pulse (!) 104    Temp 98.7 F (37.1 C) (Oral)    Resp 16    SpO2 98%  Gen:   Awake, no distress   Resp:  Normal effort no accessory breath sounds MSK:   Moves extremities without difficulty  Other:  No LE edema  Medical Decision Making  Medically screening exam initiated at 10:19 AM.  Appropriate orders placed.  [name] was informed that the remainder of the evaluation will be completed by another provider, this initial triage assessment does not replace that evaluation, and the importance of remaining in the ED until their evaluation is complete.  Workup initiated   Olene Floss, [place] 03/05/21 1021

## 2021-03-05 NOTE — ED Provider Notes (Signed)
[facility] EMERGENCY DEPARTMENT Provider Note   CSN: [id number] Arrival date & time: 03/05/21  4239     History  Chief Complaint  Patient presents with   Shortness of Breath    [name] is a 56 y.o. 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  Presents to the emergency department with a chief complaint of shortness of breath.  Shortness of breath has been present over the last 3 days.  Patient states that shortness of breath is worse when laying flat and when exerting himself.  Patient states that "I feel like I have fluid on me."  Patient describes this feeling of fluid as a "constant pressure," to his chest.  No aggravating or alleviating factors.  Patient also reports that he has been having intermittent palpitations of the last 3 days.  Patient states that palpitations come on when he is feeling anxious.  Patient reports that he has been taking all his medications as prescribed including his Lasix medication daily.  Patient endorses chills and a dry cough.  Denies any fever, leg swelling or tenderness, abdominal pain, nausea, vomiting, lightheadedness, syncope, hemoptysis.  Denies any illicit drug use.  Endorses drinking 12 pack of beer weekly.    Shortness of Breath Associated symptoms: cough   Associated symptoms: no abdominal pain, no chest pain, no fever, no headaches, no neck pain, no rash, no sore throat and no vomiting       Home Medications Prior to Admission medications   Medication Sig Start Date End Date Taking? Authorizing Provider  albuterol (PROAIR HFA) 108 (90 Base) MCG/ACT inhaler Inhale 2 puffs into the lungs every 6 (six) hours as needed for shortness of breath. 09/10/20   Hoy Register, MD  aspirin 81 MG EC tablet Take 81 mg by mouth daily.  Swallow whole.    [provider]  cetirizine (ZYRTEC) 10 MG tablet TAKE 1 TABLET (10 MG TOTAL) BY MOUTH DAILY. 07/04/20   Hoy Register, MD  furosemide (LASIX) 40 MG tablet TAKE 1/2 TABLET IN THE MORNING AND 1 TABLET MID-DAY 02/05/21   Chilton Si, MD  metoprolol succinate (TOPROL-XL) 100 MG 24 hr tablet Take 1.5 tablets (150 mg total) by mouth daily. Take with or immediately following a meal. 02/05/21   Chilton Si, MD  sacubitril-valsartan (ENTRESTO) 97-103 MG Take 1 tablet by mouth 2 (two) times daily. 06/20/20   Chilton Si, MD  tiotropium (SPIRIVA HANDIHALER) 18 MCG inhalation capsule Place 1 capsule (18 mcg total) into inhaler and inhale daily. 09/10/20   Hoy Register, MD      Allergies    Penicillins    Review of Systems   Review of Systems  Constitutional:  Positive for chills. Negative for fever.  HENT:  Negative for congestion, rhinorrhea and sore throat.   Eyes:  Negative for visual disturbance.  Respiratory:  Positive for cough and shortness of breath.   Cardiovascular:  Positive for palpitations. Negative for chest pain and leg swelling.  Gastrointestinal:  Negative for abdominal pain, nausea and vomiting.  Musculoskeletal:  Negative for back pain and neck pain.  Skin:  Negative for color change and rash.  Neurological:  Negative for dizziness, syncope, light-headedness and headaches.  Psychiatric/Behavioral:  Negative for confusion.    Physical Exam Updated Vital Signs BP (!) 150/107    Pulse (!) 103  Temp 98.7 F (37.1 C) (Oral)    Resp (!) 30    SpO2 99%  Physical Exam Vitals and nursing note reviewed.  Constitutional:      General: He is not in acute distress.    Appearance: He is not ill-appearing, toxic-appearing or diaphoretic.  HENT:     Head: Normocephalic.  Eyes:     General: No scleral icterus.       Right eye: No discharge.        Left eye: No discharge.  Cardiovascular:     Rate and Rhythm: Tachycardia present.      Pulses:          Radial pulses are 2+ on the right side and 2+ on the left side.     Heart sounds: Normal heart sounds. No murmur heard. Pulmonary:     Effort: Pulmonary effort is normal. No tachypnea or respiratory distress.     Breath sounds: Normal breath sounds. No stridor. No wheezing, rhonchi or rales.     Comments: Speaks in full sentences without difficulty Abdominal:     General: Abdomen is flat. There is no distension. There are no signs of injury.     Palpations: Abdomen is soft. There is no mass or pulsatile mass.     Tenderness: There is no abdominal tenderness. There is no guarding or rebound.  Musculoskeletal:     Right lower leg: No edema.     Left lower leg: No edema.  Skin:    General: Skin is warm and dry.  Neurological:     General: No focal deficit present.     Mental Status: He is alert.     GCS: GCS eye subscore is 4. GCS verbal subscore is 5. GCS motor subscore is 6.  Psychiatric:        Behavior: Behavior is cooperative.    ED Results / Procedures / Treatments   Labs (all labs ordered are listed, but only abnormal results are displayed) Labs Reviewed  CBC WITH DIFFERENTIAL/PLATELET - Abnormal; Notable for the following components:      Result Value   RBC 3.94 (*)    MCV 101.8 (*)    MCH 34.5 (*)    All other components within normal limits  BASIC METABOLIC PANEL - Abnormal; Notable for the following components:   Glucose, Bld 125 (*)    Creatinine, Ser 1.34 (*)    Anion gap 16 (*)    All other components within normal limits  BRAIN NATRIURETIC PEPTIDE - Abnormal; Notable for the following components:   B Natriuretic Peptide >4,500.0 (*)    All other components within normal limits  HEPATIC FUNCTION PANEL - Abnormal; Notable for the following components:   Total Bilirubin 2.4 (*)    Bilirubin, Direct 0.4 (*)    Indirect Bilirubin 2.0 (*)    All other components within normal limits  D-DIMER, QUANTITATIVE - Abnormal; Notable for the following  components:   D-Dimer, Quant 1.10 (*)    All other components within normal limits  TROPONIN I (HIGH SENSITIVITY) - Abnormal; Notable for the following components:   Troponin I (High Sensitivity) 47 (*)    All other components within normal limits  RESP PANEL BY RT-PCR (FLU A&B, COVID) [id number]  TROPONIN I (HIGH SENSITIVITY)    EKG EKG Interpretation  Date/Time:  Tuesday March 05 2021 10:01:15 EST Ventricular Rate:  108 PR Interval:  152 QRS Duration: 128 QT Interval:  386 QTC Calculation: 517 R Axis:   -52  Text Interpretation: Sinus tachycardia Left axis deviation Left ventricular hypertrophy with QRS widening and repolarization abnormality ( Sokolow-Lyon , Cornell product ) Abnormal ECG When compared to prior, faster rate but similar t wave inversions in leads V5 and V6, now upright in leads 1 and AVL. No STEMI Confirmed by Theda Belfast ([id number]) on 03/05/2021 11:01:40 AM  Radiology DG Chest 2 View  Result Date: 03/05/2021 CLINICAL DATA:  Shortness of breath, COPD, heart failure EXAM: CHEST - 2 VIEW COMPARISON:  05/23/2020 FINDINGS: Cardiomegaly. Left chest single lead defibrillator. Both lungs are clear. The visualized skeletal structures are unremarkable. IMPRESSION: Cardiomegaly. No acute abnormality of the lungs. Electronically Signed   By: Jearld Lesch M.D.   On: 03/05/2021 10:39   CT Angio Chest PE W and/or Wo Contrast  Result Date: 03/05/2021 CLINICAL DATA:  Shortness of breath, PE suspected EXAM: CT ANGIOGRAPHY CHEST WITH CONTRAST TECHNIQUE: Multidetector CT imaging of the chest was performed using the standard protocol during bolus administration of intravenous contrast. Multiplanar CT image reconstructions and MIPs were obtained to evaluate the vascular anatomy. RADIATION DOSE REDUCTION: This exam was performed according to the departmental dose-optimization program which includes automated exposure control, adjustment of the mA and/or kV according to patient size and/or  use of iterative reconstruction technique. CONTRAST:  OMNIPAQUE IOHEXOL 350 MG/ML SOLN COMPARISON:  None. FINDINGS: Cardiovascular: Satisfactory opacification of the pulmonary arteries to the segmental level. No evidence of pulmonary embolism. Enlargement of the main pulmonary artery, measuring up to 3.5 cm, as can be seen in the setting of pulmonary hypertension. Cardiomegaly. Reflux of contrast into the hepatic veins, suggestive of right heart dysfunction. Left chest cardiac device with lead in the right ventricle. Mediastinum/Nodes: No enlarged mediastinal, hilar, or axillary lymph nodes. Thyroid gland, trachea, and esophagus demonstrate no significant findings. Lungs/Pleura: Bibasilar atelectasis with some interlobular septal thickening and mild ground-glass opacities, suggesting mild pulmonary edema. No pleural effusion or pneumothorax. Upper Abdomen: Aortic atherosclerosis. Enlargement of the left-greater-than-right adrenal gland, which are poorly evaluated. Musculoskeletal: Left chest cardiac device. No acute chest wall abnormality. No acute or significant osseous findings. Review of the MIP images confirms the above findings. IMPRESSION: 1. No evidence of pulmonary embolism. 2. Cardiomegaly, with reflux of contrast into the hepatic veins suggesting right heart dysfunction in enlargement of the main pulmonary artery suggesting pulmonary hypertension and likely left heart dysfunction. Correlate with BNP. 3. Mild interlobular septal thickening and scattered ground-glass opacities in the lung bases, suggesting mild pulmonary edema. No pleural effusion. 4. Enlargement of the left-greater-than-right adrenal gland, which is nonspecific and poorly evaluated. Consider nonemergent CT abdomen pelvis for further evaluation. Electronically Signed   By: Wiliam Ke M.D.   On: 03/05/2021 13:35    Procedures Procedures    Medications Ordered in ED Medications  furosemide (LASIX) injection 60 mg (60 mg  Intravenous Given 03/05/21 1205)  iohexol (OMNIPAQUE) 350 MG/ML injection 100 mL (100 mLs Intravenous Contrast Given 03/05/21 1320)    ED Course/ Medical Decision Making/ A&P Clinical Course as of 03/05/21 1543  Tue Mar 05, 2021  1443 Spoke to resident Dr.Carter with hospitalist team who will see the patient for admission. [PB]    Clinical Course User Index [PB] Haskel Schroeder, PA-C                            Medical Decision Making Amount and/or Complexity of Data Reviewed Labs: ordered. Decision-making details documented in ED Course. Radiology: ordered.  Risk Prescription drug management. Decision regarding hospitalization.   Alert 56 year old male in no acute distress, nontoxic-appearing.  Presents emergency department for complaint of shortness of breath.  Information was obtained from patient.  Previous medical or records were reviewed including provider notes, labs, and imaging.  See HPI for past medical history that complicates patient's care.  Due to patient's report of chest pressure and shortness of breath will obtain troponin, D-dimer, BNP EKG, chest x-ray, CMP, and CBC, COVID-19 and influenza testing  EKG was independently reviewed myself and shows sinus tachycardia with LVH.  Previous EKGs of similar T wave changes.  No signs of STEMI.  Chest x-ray was independently reviewed myself and shows cardiomegaly.  No acute pulmonary process.  Lab work was independently reviewed myself.  Pertinent findings include: -Troponin 47; patient previously elevated at 30.  Will obtain delta troponin -BNP greater than 4500 -Creatinine 1.34 -D-dimer 1.10 -Respiratory panel negative for COVID-19 and influenza -Total bili 2.4  Due to elevated BNP will give patient IV dose of Lasix.  Patient takes 60 mg oral Lasix daily.  Due to markedly elevated BNP with AKI believe patient will benefit from admission.  Due to elevated D-dimer will obtain CTA chest to evaluate for possible  PE.  CT imaging shows no evidence of PE.  Cardiomegaly.  Mild interlobular septal thickening and scattered groundglass opacities in lung bases, suggesting mild pulmonary edema.  We will consult hospitalist for admission.  I spoke to resident Dr. Montez Morita with hospitalist team who will see the patient for admission.  Patient care discussed with attending physician Dr. Rush Landmark         Final Clinical Impression(s) / ED Diagnoses Final diagnoses:  Acute combined systolic and diastolic congestive heart failure [facility])    Rx / DC Orders ED Discharge Orders     None         Haskel Schroeder, PA-C 03/05/21 1544    Tegeler, Canary Brim, MD 03/06/21 [phone]

## 2021-03-05 NOTE — ED Triage Notes (Signed)
EMS stated, he has SOB  for 2 days. 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

## 2021-03-05 NOTE — Hospital Course (Addendum)
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  Acute chf exacerbation Hx systolic and disastolic Last echo 2019 ef 20-25% 3 days sob worsened when laying flat and exefrt Lungs are clear today, bnp 4500 Got lasix 60mg  IV in ed and admit, he has an aki as well Takes 20mg  lasix am and 40pm  IV lasix, ECHO, BMP Meds to hold  Entresto - 97-103 BID Metoprolol succinate 150 daily Lasix 20 Am and 60pm Aspirin 81  Albuterol inhaler q6hr prn Tiotropium inhalation treatment daily   Hx medication non-adheraance. Unable to tolerate bidil due to headache  Prior cva with sz follows with neurology.   Feel like he was drowning at night. Takes half a pill in the morning and a full pill at night. Dose cut because urinating so frequently during the day. Tried to cough to breathe better but this didn't work.   Got new script a week and a half ago. The last three days haven't been peeing much.  Takes all of his meds but sometimes misses doses of his pills. States he sometimes gets dizzy, unsure with which meds however. He says about 2 times a week he misses a dose. If he is trying to do something outside.   Dose reduced about 6 months ago.   Follows with Dr. . Has appointment with her April 24th. Last saw her 6 months ago. Dr [name].    He drinks two beers and brisk sodas and water every day. Sometimes drinks a lot of fluids but can't pee it out.    Had cold sweat this AM, but no objective fevers. No chest pain.  Does not sleep well. Sleep study negative in the past.  Had four lasix before came called hospital.  Drink more than usual over the last few days, trying to make himself throw up.   97-98 heart is usual. Just metroprolol.   Every time he eats. Throw ups.   PMH:  CVA Seizures last one 2-3 months, said because of higher dose of medicine he was on. Dr April 26 and Dr. Alvis Lemmings stopped these medications.  [place] [facility] last seen about 5 years ago. Made appointment but transportation difficlut to get.   

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
## 2021-03-05 NOTE — ED Triage Notes (Signed)
Pt. Stated, My SOB is probably causing Im holding fluid my Lasix is not working like it usually does.

## 2021-03-05 NOTE — ED Notes (Signed)
Noticed transport tech waiting outside pts room. When inquiring if she needed assistance, she states she was bringing the pt back and didn't want to leave him as he was having CP. Pt alert and oriented. Skin w/d. Sitting on side of the stretcher complaining of left mid chest 'cramping' and mild sob. Speaks in full clear sentences. Appears to be breathing without difficulty. Pt hooked back up to monitor and ECG ran. Given to EDP for comparison. Primary RN aware. Approx 4 min later patient states pain has resolved and feeling better.

## 2021-03-06 ENCOUNTER — Observation Stay (HOSPITAL_COMMUNITY): Payer: Medicaid Other

## 2021-03-06 DIAGNOSIS — I69349 Monoplegia of lower limb following cerebral infarction affecting unspecified side: Secondary | ICD-10-CM | POA: Diagnosis not present

## 2021-03-06 DIAGNOSIS — Z20822 Contact with and (suspected) exposure to covid-19: Secondary | ICD-10-CM | POA: Diagnosis not present

## 2021-03-06 DIAGNOSIS — I5041 Acute combined systolic (congestive) and diastolic (congestive) heart failure: Secondary | ICD-10-CM

## 2021-03-06 DIAGNOSIS — Z8673 Personal history of transient ischemic attack (TIA), and cerebral infarction without residual deficits: Secondary | ICD-10-CM | POA: Diagnosis not present

## 2021-03-06 DIAGNOSIS — Z8249 Family history of ischemic heart disease and other diseases of the circulatory system: Secondary | ICD-10-CM | POA: Diagnosis not present

## 2021-03-06 DIAGNOSIS — I5082 Biventricular heart failure: Secondary | ICD-10-CM | POA: Diagnosis not present

## 2021-03-06 DIAGNOSIS — N179 Acute kidney failure, unspecified: Secondary | ICD-10-CM | POA: Diagnosis not present

## 2021-03-06 DIAGNOSIS — I509 Heart failure, unspecified: Secondary | ICD-10-CM | POA: Diagnosis not present

## 2021-03-06 DIAGNOSIS — I110 Hypertensive heart disease with heart failure: Secondary | ICD-10-CM | POA: Diagnosis not present

## 2021-03-06 DIAGNOSIS — F1010 Alcohol abuse, uncomplicated: Secondary | ICD-10-CM

## 2021-03-06 DIAGNOSIS — Z9114 Patient's other noncompliance with medication regimen: Secondary | ICD-10-CM | POA: Diagnosis not present

## 2021-03-06 DIAGNOSIS — R0602 Shortness of breath: Secondary | ICD-10-CM | POA: Diagnosis not present

## 2021-03-06 DIAGNOSIS — Z7982 Long term (current) use of aspirin: Secondary | ICD-10-CM | POA: Diagnosis not present

## 2021-03-06 DIAGNOSIS — I5043 Acute on chronic combined systolic (congestive) and diastolic (congestive) heart failure: Secondary | ICD-10-CM

## 2021-03-06 DIAGNOSIS — F17210 Nicotine dependence, cigarettes, uncomplicated: Secondary | ICD-10-CM | POA: Diagnosis not present

## 2021-03-06 DIAGNOSIS — I517 Cardiomegaly: Secondary | ICD-10-CM | POA: Diagnosis not present

## 2021-03-06 DIAGNOSIS — I5021 Acute systolic (congestive) heart failure: Secondary | ICD-10-CM | POA: Diagnosis not present

## 2021-03-06 DIAGNOSIS — I272 Pulmonary hypertension, unspecified: Secondary | ICD-10-CM | POA: Diagnosis not present

## 2021-03-06 DIAGNOSIS — J9811 Atelectasis: Secondary | ICD-10-CM | POA: Diagnosis not present

## 2021-03-06 DIAGNOSIS — Z95810 Presence of automatic (implantable) cardiac defibrillator: Secondary | ICD-10-CM | POA: Diagnosis not present

## 2021-03-06 DIAGNOSIS — N2889 Other specified disorders of kidney and ureter: Secondary | ICD-10-CM | POA: Diagnosis not present

## 2021-03-06 DIAGNOSIS — I428 Other cardiomyopathies: Secondary | ICD-10-CM | POA: Diagnosis not present

## 2021-03-06 DIAGNOSIS — J449 Chronic obstructive pulmonary disease, unspecified: Secondary | ICD-10-CM | POA: Diagnosis not present

## 2021-03-06 DIAGNOSIS — Z79899 Other long term (current) drug therapy: Secondary | ICD-10-CM | POA: Diagnosis not present

## 2021-03-06 LAB — ECHOCARDIOGRAM COMPLETE
AR max vel: 3.79 cm2
AV Area VTI: 3.6 cm2
AV Area mean vel: 3.48 cm2
AV Mean grad: 2 mmHg
AV Peak grad: 3.2 mmHg
Ao pk vel: 0.9 m/s
Area-P 1/2: 7.37 cm2
Calc EF: 22.2 %
Height: 68 in
MV M vel: 3.32 m/s
MV Peak grad: 44.1 mmHg
S' Lateral: 7.2 cm
Single Plane A2C EF: 23.6 %
Single Plane A4C EF: 22 %
Weight: 2239.87 oz

## 2021-03-06 LAB — URINALYSIS, COMPLETE (UACMP) WITH MICROSCOPIC
Bacteria, UA: NONE SEEN
Bilirubin Urine: NEGATIVE
Glucose, UA: NEGATIVE mg/dL
Hgb urine dipstick: NEGATIVE
Ketones, ur: NEGATIVE mg/dL
Leukocytes,Ua: NEGATIVE
Nitrite: NEGATIVE
Protein, ur: NEGATIVE mg/dL
Specific Gravity, Urine: 1.024 (ref 1.005–1.030)
pH: 5 (ref 5.0–8.0)

## 2021-03-06 LAB — SODIUM, URINE, RANDOM: Sodium, Ur: 42 mmol/L

## 2021-03-06 LAB — BASIC METABOLIC PANEL
Anion gap: 12 (ref 5–15)
BUN: 23 mg/dL — ABNORMAL HIGH (ref 6–20)
CO2: 26 mmol/L (ref 22–32)
Calcium: 9.5 mg/dL (ref 8.9–10.3)
Chloride: 99 mmol/L (ref 98–111)
Creatinine, Ser: 1.47 mg/dL — ABNORMAL HIGH (ref 0.61–1.24)
GFR, Estimated: 56 mL/min — ABNORMAL LOW (ref >60)
Glucose, Bld: 147 mg/dL — ABNORMAL HIGH (ref 70–99)
Potassium: 3.4 mmol/L — ABNORMAL LOW (ref 3.5–5.1)
Sodium: 137 mmol/L (ref 135–145)

## 2021-03-06 LAB — MRSA NEXT GEN BY PCR, NASAL: MRSA by PCR Next Gen: NOT DETECTED

## 2021-03-06 LAB — CREATININE, URINE, RANDOM: Creatinine, Urine: 150.88 mg/dL

## 2021-03-06 LAB — CBC
HCT: 36.8 % — ABNORMAL LOW (ref 39.0–52.0)
Hemoglobin: 12.8 g/dL — ABNORMAL LOW (ref 13.0–17.0)
MCH: 34.7 pg — ABNORMAL HIGH (ref 26.0–34.0)
MCHC: 34.8 g/dL (ref 30.0–36.0)
MCV: 99.7 fL (ref 80.0–100.0)
Platelets: 160 10*3/uL (ref 150–400)
RBC: 3.69 MIL/uL — ABNORMAL LOW (ref 4.22–5.81)
RDW: 12.1 % (ref 11.5–15.5)
WBC: 5.2 10*3/uL (ref 4.0–10.5)
nRBC: 0 % (ref 0.0–0.2)

## 2021-03-06 LAB — CARBOXYHEMOGLOBIN - COOX: Carboxyhemoglobin: 2.2 % — ABNORMAL HIGH (ref 0.5–1.5)

## 2021-03-06 LAB — MAGNESIUM: Magnesium: 1.7 mg/dL (ref 1.7–2.4)

## 2021-03-06 MED ORDER — ENOXAPARIN SODIUM 40 MG/0.4ML IJ SOSY: 40.0000 mg | PREFILLED_SYRINGE | INTRAMUSCULAR | Status: DC

## 2021-03-06 MED ORDER — MAGNESIUM SULFATE 2 GM/50ML IV SOLN
2.0000 g | Freq: Once | INTRAVENOUS | Status: AC
  Administered 2021-03-06: 2 g via INTRAVENOUS
  Filled 2021-03-06: qty 50

## 2021-03-06 MED ORDER — PERFLUTREN LIPID MICROSPHERE
1.0000 mL | INTRAVENOUS | Status: AC | PRN
  Administered 2021-03-06: 2 mL via INTRAVENOUS
  Filled 2021-03-06: qty 10

## 2021-03-06 MED ORDER — POTASSIUM CHLORIDE 20 MEQ PO PACK
40.0000 meq | PACK | Freq: Two times a day (BID) | ORAL | Status: AC
  Administered 2021-03-06 (×2): 40 meq via ORAL
  Filled 2021-03-06 (×2): qty 2

## 2021-03-06 NOTE — Progress Notes (Signed)
RN paged resident on call to advise of pt Mag 1.6. Awaiting orders.

## 2021-03-06 NOTE — Plan of Care (Signed)
  Problem: Education: Goal: Knowledge of General Education information will improve Description: Including pain rating scale, medication(s)/side effects and non-pharmacologic comfort measures Outcome: Progressing   Problem: Clinical Measurements: Goal: Will remain free from infection Outcome: Progressing   Problem: Nutrition: Goal: Adequate nutrition will be maintained Outcome: Progressing   Problem: Pain Managment: Goal: General experience of comfort will improve Outcome: Progressing   
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
## 2021-03-06 NOTE — TOC Initial Note (Signed)
Transition of Care [facility]) - Initial/Assessment Note    Patient Details  Name: [name] MRN: [id number] Date of Birth: 1965/09/23  Transition of Care [facility]) CM/SW Contact:    Beckie Busing, RN Phone Number:[phone]  03/06/2021, 9:47 AM  Clinical Narrative:                  Transition of Care [facility]) Screening Note   Patient Details  Name: [name] Date of Birth: 30-Oct-1965   Transition of Care [facility]) CM/SW Contact:    Beckie Busing, RN Phone Number: 03/06/2021, 9:47 AM    Transition of Care Department [facility]) has reviewed patient and no TOC needs have been identified at this time. We will continue to monitor patient advancement through interdisciplinary progression rounds. If new patient transition needs arise, please place a TOC consult.          Patient Goals and CMS Choice        Expected Discharge Plan and Services                                                Prior Living Arrangements/Services                       Activities of Daily Living Home Assistive Devices/Equipment: None ADL Screening (condition at time of admission) Patient's cognitive ability adequate to safely complete daily activities?: Yes Is the patient deaf or have difficulty hearing?: No Does the patient have difficulty seeing, even when wearing glasses/contacts?: No Does the patient have difficulty concentrating, remembering, or making decisions?: No Patient able to express need for assistance with ADLs?: Yes Does the patient have difficulty dressing or bathing?: No Independently performs ADLs?: Yes (appropriate for developmental age) Does the patient have difficulty walking or climbing stairs?: No Weakness of Legs: Both Weakness of Arms/Hands: None  Permission Sought/Granted                  Emotional Assessment              Admission diagnosis:  Bilirubinemia [E80.6] Acute exacerbation of CHF (congestive heart failure) (HCC)  [I50.9] Acute combined systolic and diastolic congestive heart failure (HCC) [I50.41] Patient Active Problem List   Diagnosis Date Noted   Acute exacerbation of CHF (congestive heart failure) (HCC) 03/05/2021   SVT (supraventricular tachycardia) (HCC) 06/15/2020   Slurred speech 07/05/2019   ICD (implantable cardioverter-defibrillator) in place 07/20/2018   COPD (chronic obstructive pulmonary disease) (HCC) 12/09/2017   Obstructive sleep apnea 09/08/2016   Autonomic dysfunction 07/15/2015   DJD (degenerative joint disease), lumbar 06/19/2015   Tremor 05/14/2015   Abnormality of gait 05/14/2015   Neurogenic bladder 04/17/2015   Lumbar strain 03/23/2015   Leg weakness 03/23/2015   Major depression, chronic    History of stroke 02/26/2015   Benign essential HTN    ETOH abuse    Tobacco abuse    Chronic combined systolic and diastolic heart failure (HCC)    Alcoholic cardiomyopathy (HCC) 35/36/1443   Polysubstance abuse (HCC) 03/22/2014   PCP:  Hoy Register, MD Pharmacy:   [facility] - [place], [place] - [address] [place] [address] Phone: [phone] Fax: [phone]     Social Determinants of Health (SDOH) Interventions    Readmission Risk Interventions No flowsheet data found.

## 2021-03-07 DIAGNOSIS — F1010 Alcohol abuse, uncomplicated: Secondary | ICD-10-CM | POA: Diagnosis not present

## 2021-03-07 DIAGNOSIS — N179 Acute kidney failure, unspecified: Secondary | ICD-10-CM

## 2021-03-07 DIAGNOSIS — Z8673 Personal history of transient ischemic attack (TIA), and cerebral infarction without residual deficits: Secondary | ICD-10-CM | POA: Diagnosis not present

## 2021-03-07 DIAGNOSIS — I110 Hypertensive heart disease with heart failure: Secondary | ICD-10-CM | POA: Diagnosis not present

## 2021-03-07 DIAGNOSIS — I5043 Acute on chronic combined systolic (congestive) and diastolic (congestive) heart failure: Secondary | ICD-10-CM | POA: Diagnosis not present

## 2021-03-07 LAB — COMPREHENSIVE METABOLIC PANEL
ALT: 17 U/L (ref 0–44)
AST: 33 U/L (ref 15–41)
Albumin: 3.8 g/dL (ref 3.5–5.0)
Alkaline Phosphatase: 95 U/L (ref 38–126)
Anion gap: 9 (ref 5–15)
BUN: 20 mg/dL (ref 6–20)
CO2: 24 mmol/L (ref 22–32)
Calcium: 9.7 mg/dL (ref 8.9–10.3)
Chloride: 102 mmol/L (ref 98–111)
Creatinine, Ser: 1.31 mg/dL — ABNORMAL HIGH (ref 0.61–1.24)
GFR, Estimated: >60 mL/min (ref >60)
Glucose, Bld: 115 mg/dL — ABNORMAL HIGH (ref 70–99)
Potassium: 4.8 mmol/L (ref 3.5–5.1)
Sodium: 135 mmol/L (ref 135–145)
Total Bilirubin: 0.9 mg/dL (ref 0.3–1.2)
Total Protein: 6.6 g/dL (ref 6.5–8.1)

## 2021-03-07 LAB — CBC
HCT: 39.1 % (ref 39.0–52.0)
Hemoglobin: 13.5 g/dL (ref 13.0–17.0)
MCH: 35.1 pg — ABNORMAL HIGH (ref 26.0–34.0)
MCHC: 34.5 g/dL (ref 30.0–36.0)
MCV: 101.6 fL — ABNORMAL HIGH (ref 80.0–100.0)
Platelets: 157 10*3/uL (ref 150–400)
RBC: 3.85 MIL/uL — ABNORMAL LOW (ref 4.22–5.81)
RDW: 12.4 % (ref 11.5–15.5)
WBC: 5.1 10*3/uL (ref 4.0–10.5)
nRBC: 0 % (ref 0.0–0.2)

## 2021-03-07 LAB — MAGNESIUM: Magnesium: 1.9 mg/dL (ref 1.7–2.4)

## 2021-03-07 LAB — PHOSPHORUS: Phosphorus: 3.8 mg/dL (ref 2.5–4.6)

## 2021-03-07 LAB — UREA NITROGEN, URINE: Urea Nitrogen, Ur: 768 mg/dL

## 2021-03-07 MED ORDER — METOPROLOL SUCCINATE ER 50 MG PO TB24
150.0000 mg | ORAL_TABLET | Freq: Every day | ORAL | Status: DC
  Filled 2021-03-07: qty 1

## 2021-03-07 MED ORDER — MAGNESIUM SULFATE 2 GM/50ML IV SOLN
2.0000 g | Freq: Once | INTRAVENOUS | Status: AC
  Administered 2021-03-07: 2 g via INTRAVENOUS
  Filled 2021-03-07: qty 50

## 2021-03-07 MED ORDER — FUROSEMIDE 10 MG/ML IJ SOLN
40.0000 mg | Freq: Once | INTRAMUSCULAR | Status: AC
  Administered 2021-03-07: 40 mg via INTRAVENOUS
  Filled 2021-03-07: qty 4

## 2021-03-07 MED ORDER — METOPROLOL SUCCINATE ER 100 MG PO TB24
100.0000 mg | ORAL_TABLET | Freq: Every day | ORAL | Status: DC
  Administered 2021-03-07: 100 mg via ORAL

## 2021-03-07 MED ORDER — METOPROLOL SUCCINATE ER 50 MG PO TB24
50.0000 mg | ORAL_TABLET | Freq: Every day | ORAL | Status: DC
  Administered 2021-03-08 – 2021-03-10 (×3): 50 mg via ORAL
  Filled 2021-03-07 (×3): qty 1

## 2021-03-07 MED ORDER — METOPROLOL SUCCINATE ER 100 MG PO TB24: 100.0000 mg | ORAL_TABLET | Freq: Every day | ORAL | Status: DC

## 2021-03-07 NOTE — Progress Notes (Signed)
Patient is asking for MD to call his mother Benita Gutter for update on status, especially cardiac diagnosis/prognosis. [phone]

## 2021-03-07 NOTE — Plan of Care (Signed)
  Problem: Education: Goal: Knowledge of General Education information will improve Description: Including pain rating scale, medication(s)/side effects and non-pharmacologic comfort measures Outcome: Progressing   Problem: Clinical Measurements: Goal: Ability to maintain clinical measurements within normal limits will improve Outcome: Progressing   
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
## 2021-03-07 NOTE — Plan of Care (Signed)
°  Problem: Clinical Measurements: Goal: Ability to maintain clinical measurements within normal limits will improve Outcome: Progressing Goal: Will remain free from infection Outcome: Progressing Goal: Diagnostic test results will improve Outcome: Progressing Goal: Respiratory complications will improve Outcome: Progressing   Problem: Activity: Goal: Risk for activity intolerance will decrease Outcome: Progressing   Problem: Nutrition: Goal: Adequate nutrition will be maintained Outcome: Progressing   

## 2021-03-07 NOTE — Consult Note (Addendum)
Progress Note  Patient Name: [name] Date of Encounter: 03/07/2021  Primary Cardiologist: Skeet Latch, MD   Subjective   Patient is seen at bedside.  He appears comfortable and in no acute respiratory distress.  He reports symptoms of orthopnea and dyspnea on exertion started 6 days ago.  His orthopnea actually started 1 month ago. States that he has had to sleep upward.  Denies lower extremity edema.  Denies chest pain.  Endorses palpitation.  Denies ICD firing.  He is normally takes 20 mg of Lasix during the day and 40 mg at night which usually works after 30-45 minutes.  He sometimes takes an extra tablets if he feels short of breath.  He reports adherence to Lasix, metoprolol and Entresto.  States that metoprolol makes him dizzy.  States that he continues to have orthopnea the last few nights.  Still endorses chest congestion.  No shortness of breath at rest.  States that he took an extra tablet of home Lasix 40 mg on the second day because of minimal urine output with IV Lasix.  He endorses smoking a few cigarettes a day.  Or drink alcohol on special occasions.  He has not used cocaine or any other substances for 5 years.  He understands standpoint of not using cocaine with beta-blocker at the same time.  Inpatient Medications    Scheduled Meds:  enoxaparin (LOVENOX) injection  40 mg Subcutaneous [id number]   folic acid  1 mg Oral Daily   [START ON 03/08/2021] metoprolol succinate  100 mg Oral Daily   multivitamin with minerals  1 tablet Oral Daily   thiamine  100 mg Oral Daily   Or   thiamine  100 mg Intravenous Daily   Continuous Infusions:  PRN Meds: acetaminophen **OR** acetaminophen, albuterol, senna-docusate   Vital Signs    Vitals:   03/07/21 0600 03/07/21 0611 03/07/21 0700 03/07/21 1245  BP:   110/74 118/79  Pulse: 75  80 88  Resp:   20 20  Temp:   97.8 F (36.6 C) 97.8 F (36.6 C)  TempSrc:   Oral Oral  SpO2:   98% 93%  Weight:  69.1 kg     Height:        Intake/Output Summary (Last 24 hours) at 03/07/2021 1330 Last data filed at 03/07/2021 0900 Gross per 24 hour  Intake 300 ml  Output 630 ml  Net -330 ml   Filed Weights   03/05/21 2346 03/06/21 0626 03/07/21 0611  Weight: 62.9 kg 63.5 kg 69.1 kg    Telemetry    Variable rate.  Mostly controlled - Personally Reviewed  ECG    2/15: sinus tach, LAD, LBBB, left atrial enlargement, T wave inversion V5-6- Personally Reviewed  Physical Exam   GEN: No acute distress.   Neck: Positive JVD to mid neck Cardiac: RRR, fixed split S2, no murmurs, rubs, or gallops.  Respiratory: Crackles at bilateral lung bases, right worse than left.  No wheezing or coughing during examination. GI: Soft, nontender, non-distended  MS: No edema; No deformity.  Extremities are warm to touch. Neuro:  Nonfocal  Psych: Normal affect   Labs    Chemistry Recent Labs  Lab 03/05/21 1024 03/05/21 1130 03/06/21 0057 03/07/21 0105  NA 139  --  137 135  K 4.1  --  3.4* 4.8  CL 98  --  99 102  CO2 25  --  26 24  GLUCOSE 125*  --  147* 115*  BUN 16  --  23* 20  CREATININE 1.34*  --  1.47* 1.31*  CALCIUM 9.6  --  9.5 9.7  PROT  --  7.4  --  6.6  ALBUMIN  --  4.3  --  3.8  AST  --  29  --  33  ALT  --  18  --  17  ALKPHOS  --  77  --  95  BILITOT  --  2.4*  --  0.9  GFRNONAA >60  --  56* >60  ANIONGAP 16*  --  12 9     Hematology Recent Labs  Lab 03/05/21 1024 03/06/21 0057 03/07/21 0105  WBC 9.0 5.2 5.1  RBC 3.94* 3.69* 3.85*  HGB 13.6 12.8* 13.5  HCT 40.1 36.8* 39.1  MCV 101.8* 99.7 101.6*  MCH 34.5* 34.7* 35.1*  MCHC 33.9 34.8 34.5  RDW 12.4 12.1 12.4  PLT 170 160 157    Cardiac EnzymesNo results for input(s): TROPONINI in the last 168 hours. No results for input(s): TROPIPOC in the last 168 hours.   BNP Recent Labs  Lab 03/05/21 1024  BNP >4,500.0*     DDimer  Recent Labs  Lab 03/05/21 1130  DDIMER 1.10*     Radiology    US RENAL  Result Date:  03/06/2021 CLINICAL DATA:  Acute renal insufficiency EXAM: RENAL / URINARY TRACT ULTRASOUND COMPLETE COMPARISON:  Right upper quadrant ultrasound of 1 day prior. FINDINGS: Right Kidney: Renal measurements: 10.8 x 4.3 x 4.4 cm = volume: 106 mL. Echogenicity within normal limits. No mass or hydronephrosis visualized. Left Kidney: Renal measurements: 11.0 x 5.6 x 4.8 cm = volume: 156 mL. Echogenicity within normal limits. No mass or hydronephrosis visualized. Bladder: Decompressed. Apparent wall thickening is at least partially felt to be secondary. Other: None. IMPRESSION: 1. Normal appearance of the kidneys. 2. Decompressed urinary bladder. Apparent wall thickening is at least partially felt to be secondary. Cystitis or bladder outlet obstruction could look similar. Electronically Signed   By: Abigail Miyamoto M.D.   On: 03/06/2021 10:02   ECHOCARDIOGRAM COMPLETE  Result Date: 03/06/2021    ECHOCARDIOGRAM REPORT   Patient Name:   [name] Date of Exam: 03/06/2021 Medical Rec #:  [id number]        Height:       68.0 in Accession #:    [id number]       Weight:       140.0 lb Date of Birth:  [birth year]        BSA:          1.756 m Patient Age:    56 years         BP:           98/73 mmHg Patient Gender: M                HR:           64 bpm. Exam Location:  Inpatient Procedure: 2D Echo, Cardiac Doppler, Color Doppler, Strain Analysis and            Intracardiac Opacification Agent Indications:    CHF  History:        Patient has prior history of Echocardiogram examinations, most                 recent 11/11/2017. CHF, Stroke; Risk Factors:Hypertension.                 01/18/2018 ICD  06/03/2019 right heart cath.  Sonographer:    Luisa Hart RDCS Referring Phys: [place]  1. Left ventricular ejection fraction, by estimation, is <20%. The left ventricle has severely decreased function. The left ventricle demonstrates global hypokinesis. No LV thrombus. The left ventricular  internal cavity size was severely dilated. Left ventricular diastolic parameters are consistent with Grade II diastolic dysfunction (pseudonormalization). The average left ventricular global longitudinal strain is -4.7 %. The global longitudinal strain is abnormal.  2. Right ventricular systolic function is moderately reduced. The right ventricular size is mildly enlarged. There is normal pulmonary artery systolic pressure. The estimated right ventricular systolic pressure is Q000111Q mmHg.  3. Left atrial size was severely dilated.  4. Right atrial size was mildly dilated.  5. The mitral valve is normal in structure. Trivial mitral valve regurgitation. No evidence of mitral stenosis.  6. The aortic valve is tricuspid. Aortic valve regurgitation is not visualized. No aortic stenosis is present.  7. The inferior vena cava is dilated in size with >50% respiratory variability, suggesting right atrial pressure of 8 mmHg. FINDINGS  Left Ventricle: Left ventricular ejection fraction, by estimation, is <20%. The left ventricle has severely decreased function. The left ventricle demonstrates global hypokinesis. Definity contrast agent was given IV to delineate the left ventricular endocardial borders. The average left ventricular global longitudinal strain is -4.7 %. The global longitudinal strain is abnormal. The left ventricular internal cavity size was severely dilated. There is no left ventricular hypertrophy. Left ventricular  diastolic parameters are consistent with Grade II diastolic dysfunction (pseudonormalization). Right Ventricle: The right ventricular size is mildly enlarged. No increase in right ventricular wall thickness. Right ventricular systolic function is moderately reduced. There is normal pulmonary artery systolic pressure. The tricuspid regurgitant velocity is 2.62 m/s, and with an assumed right atrial pressure of 8 mmHg, the estimated right ventricular systolic pressure is Q000111Q mmHg. Left Atrium: Left  atrial size was severely dilated. Right Atrium: Right atrial size was mildly dilated. Pericardium: Trivial pericardial effusion is present. Mitral Valve: The mitral valve is normal in structure. Trivial mitral valve regurgitation. No evidence of mitral valve stenosis. Tricuspid Valve: The tricuspid valve is normal in structure. Tricuspid valve regurgitation is trivial. Aortic Valve: The aortic valve is tricuspid. Aortic valve regurgitation is not visualized. No aortic stenosis is present. Aortic valve mean gradient measures 2.0 mmHg. Aortic valve peak gradient measures 3.2 mmHg. Aortic valve area, by VTI measures 3.60 cm. Pulmonic Valve: The pulmonic valve was normal in structure. Pulmonic valve regurgitation is not visualized. Aorta: The aortic root is normal in size and structure. Venous: The inferior vena cava is dilated in size with greater than 50% respiratory variability, suggesting right atrial pressure of 8 mmHg. IAS/Shunts: No atrial level shunt detected by color flow Doppler. Additional Comments: A device lead is visualized in the right ventricle.  LEFT VENTRICLE PLAX 2D LVIDd:         7.10 cm      Diastology LVIDs:         7.20 cm      LV e' medial:    3.45 cm/s LV PW:         0.90 cm      LV E/e' medial:  19.4 LV IVS:        0.90 cm      LV e' lateral:   4.20 cm/s LVOT diam:     2.60 cm      LV E/e' lateral: 16.0 LV SV:  51 LV SV Index:   29           2D Longitudinal Strain LVOT Area:     5.31 cm     2D Strain GLS Avg:     -4.7 %  LV Volumes (MOD) LV vol d, MOD A2C: 216.0 ml LV vol d, MOD A4C: 227.0 ml LV vol s, MOD A2C: 165.0 ml LV vol s, MOD A4C: 177.0 ml LV SV MOD A2C:     51.0 ml LV SV MOD A4C:     227.0 ml LV SV MOD BP:      49.3 ml RIGHT VENTRICLE RV Basal diam:  4.20 cm RV Mid diam:    2.70 cm RV S prime:     4.65 cm/s TAPSE (M-mode): 1.3 cm LEFT ATRIUM           Index        RIGHT ATRIUM           Index LA diam:      4.30 cm 2.45 cm/m   RA Area:     14.70 cm LA Vol (A2C): 91.4 ml 52.04  ml/m  RA Volume:   33.50 ml  19.07 ml/m  AORTIC VALVE                    PULMONIC VALVE AV Area (Vmax):    3.79 cm     PV Vmax:       0.53 m/s AV Area (Vmean):   3.48 cm     PV Vmean:      35.250 cm/s AV Area (VTI):     3.60 cm     PV VTI:        0.106 m AV Vmax:           89.65 cm/s   PV Peak grad:  1.1 mmHg AV Vmean:          59.750 cm/s  PV Mean grad:  1.0 mmHg AV VTI:            0.140 m AV Peak Grad:      3.2 mmHg AV Mean Grad:      2.0 mmHg LVOT Vmax:         63.95 cm/s LVOT Vmean:        39.150 cm/s LVOT VTI:          0.095 m LVOT/AV VTI ratio: 0.68  AORTA Ao Root diam: 3.60 cm Ao Asc diam:  3.10 cm MITRAL VALVE               TRICUSPID VALVE MV Area (PHT): 7.37 cm    TR Peak grad:   27.5 mmHg MV Decel Time: 103 msec    TR Vmax:        262.00 cm/s MR Peak grad: 44.1 mmHg MR Mean grad: 31.0 mmHg    SHUNTS MR Vmax:      332.00 cm/s  Systemic VTI:  0.10 m MR Vmean:     271.0 cm/s   Systemic Diam: 2.60 cm MV E velocity: 67.10 cm/s MV A velocity: 35.30 cm/s MV E/A ratio:  1.90 Dalton McleanMD Electronically signed by Franki Monte Signature Date/Time: 03/06/2021/3:15:22 PM    Final    US Abdomen Limited RUQ (LIVER/GB)  Result Date: 03/05/2021 CLINICAL DATA:  Hyperbilirubinemia EXAM: ULTRASOUND ABDOMEN LIMITED RIGHT UPPER QUADRANT COMPARISON:  Ultrasound 06/02/2019 FINDINGS: Gallbladder: No gallstones or wall thickening visualized. No sonographic Murphy sign noted by sonographer. Common bile duct: Diameter: 4.5 mm Liver:  Liver slightly echogenic. No focal hepatic abnormality. Portal vein is patent on color Doppler imaging with normal direction of blood flow towards the liver. Other: None. IMPRESSION: 1. Negative for biliary dilatation 2. Liver slightly echogenic suggesting steatosis Electronically Signed   By: Jasmine Pang M.D.   On: 03/05/2021 18:37    Cardiac Studies   Echo 2/15   1. Left ventricular ejection fraction, by estimation, is <20%. The left  ventricle has severely decreased function.  The left ventricle demonstrates  global hypokinesis. No LV thrombus. The left ventricular internal cavity  size was severely dilated. Left  ventricular diastolic parameters are consistent with Grade II diastolic  dysfunction (pseudonormalization). The average left ventricular global  longitudinal strain is -4.7 %. The global longitudinal strain is abnormal.   2. Right ventricular systolic function is moderately reduced. The right  ventricular size is mildly enlarged. There is normal pulmonary artery  systolic pressure. The estimated right ventricular systolic pressure is  35.5 mmHg.   3. Left atrial size was severely dilated.   4. Right atrial size was mildly dilated.   5. The mitral valve is normal in structure. Trivial mitral valve  regurgitation. No evidence of mitral stenosis.   6. The aortic valve is tricuspid. Aortic valve regurgitation is not  visualized. No aortic stenosis is present.   7. The inferior vena cava is dilated in size with >50% respiratory  variability, suggesting right atrial pressure of 8 mmHg.   Patient Profile     56 y.o. male with past medical history of HFsrEF s/p ICD 2019, CVA, hypertension, cocaine use disorder, who present with worsening dyspnea on exertion.  Currently managing acute on chronic HFrEF.  Assessment & Plan     Acute on chronic HF with severe reduced EF Nonischemic cardiomyopathy He presented with mainly symptoms of left-sided heart failure.  Repeat echo showed EF < 20% and severe dilated LA.  RV is only mildly enlarged.  No reported heart cath.  He had a Myoview in 2018 which consistent with prior MI.  Suspect related to cocaine use.  He has minimal urine output overnight with worsening AKI and hypotension.  At risk for low output heart failure.  No evidence of cardiogenic shock at this time. -With his persistent orthopnea and pulmonary edema, will try to diurese him with 1 dose of IV Lasix 40 mg. If no good urine output by this PM, will start  Milrinone and involve advanced HF team. -Holding Entresto -Cut back on Metoprolol to 50 mg for BP and acute decompensated HF.  -Monitor I/O, daily weight  -Most recent weight 142 pounds in 8/22.  Now 152 lbs.   AKI Cardiorenal versus prerenal -Holding Entresto -BMP daily  Hypertension Blood pressures on the lower side, consistent with HFsrEF. -Continue metoprolol 50 mg -Holding Entresto  SVT High burden of SVT on device interrogation.  He is mostly asymptomatic. -Continue metoprolol  COPD PFT in 2019 suggest mild obstructive disease  -No obvious wheezing heard on lung auscultation.  Low suspicion for COPD exacerbation. -Continue inhalers  For questions or updates, please contact [facility] Please consult [email or web address] for contact info under Cardiology/STEMI.    Signed, Doran Stabler, DO  03/07/2021, 1:30 PM     I have seen and examined the patient along with Doran Stabler, DO.  I have reviewed the chart, notes and new data.  I agree with PA/NP's note.  Key new complaints: He has been orthopneic for over a month.  Oral diuretics no longer  have any effect to increase urine output.  Spite very poor appetite, he has been gaining weight and is at least 10 pounds higher than his previously estimated dry weight. Key examination changes: JVP to angle of jaw, S3 present, clear lungs, regular rate and rhythm.  Appears to have Cheyne-Stokes breathing.  However, extremities are warm.  Developed profound diaphoresis and dizziness after receiving oral metoprolol. Key new findings / data: Creatinine 1.3 improved from yesterday 1.47, but well above baseline of 0.8-0.9.  Normal electrolytes.  Normal albumin and transaminases.  PLAN: Acutely decompensated heart failure.  Appears to be "warm and wet" but his renal function is worse than baseline.  We will give another dose of intravenous diuretics, but have low threshold for initiation of inotropic support.  Reduce beta-blocker dose, but avoid  abrupt discontinuation. If he does not have a good response to intravenous diuretics today, will place a PICC line and start intravenous milrinone and consult the advanced heart failure service.  Sanda Klein, MD, [place] [phone] 03/07/2021, 3:05 PM

## 2021-03-08 LAB — BASIC METABOLIC PANEL
Anion gap: 9 (ref 5–15)
BUN: 21 mg/dL — ABNORMAL HIGH (ref 6–20)
CO2: 24 mmol/L (ref 22–32)
Calcium: 9.6 mg/dL (ref 8.9–10.3)
Chloride: 103 mmol/L (ref 98–111)
Creatinine, Ser: 1.2 mg/dL (ref 0.61–1.24)
GFR, Estimated: >60 mL/min (ref >60)
Glucose, Bld: 102 mg/dL — ABNORMAL HIGH (ref 70–99)
Potassium: 3.7 mmol/L (ref 3.5–5.1)
Sodium: 136 mmol/L (ref 135–145)

## 2021-03-08 LAB — MAGNESIUM: Magnesium: 1.8 mg/dL (ref 1.7–2.4)

## 2021-03-08 MED ORDER — FUROSEMIDE 40 MG PO TABS: 80.0000 mg | ORAL_TABLET | Freq: Two times a day (BID) | ORAL | Status: DC

## 2021-03-08 MED ORDER — FUROSEMIDE 10 MG/ML IJ SOLN
80.0000 mg | Freq: Two times a day (BID) | INTRAMUSCULAR | Status: DC
  Administered 2021-03-08 (×2): 80 mg via INTRAVENOUS
  Filled 2021-03-08 (×2): qty 8

## 2021-03-08 NOTE — Progress Notes (Signed)
Progress Note  Patient Name: [name] Date of Encounter: 03/08/2021  [facility] Cardiologist: Skeet Latch, MD   Subjective   Feels better.  Was able to sleep almost horizontally last night.  Good diuresis and improving renal function with  negative fluid balance  Inpatient Medications    Scheduled Meds:  enoxaparin (LOVENOX) injection  40 mg Subcutaneous A999333   folic acid  1 mg Oral Daily   furosemide  80 mg Intravenous Q12H   metoprolol succinate  50 mg Oral Daily   multivitamin with minerals  1 tablet Oral Daily   thiamine  100 mg Oral Daily   Or   thiamine  100 mg Intravenous Daily   Continuous Infusions:  PRN Meds: acetaminophen **OR** acetaminophen, albuterol, senna-docusate   Vital Signs    Vitals:   03/07/21 1930 03/07/21 2342 03/08/21 0339 03/08/21 0752  BP: 112/84 109/87 114/86 113/87  Pulse: 80 80 67 61  Resp: 20 20 18 19   Temp: 98.3 F (36.8 C) 98 F (36.7 C) 97.8 F (36.6 C) 97.8 F (36.6 C)  TempSrc: Oral Oral Oral Oral  SpO2: 96% 96% 99% 98%  Weight:   65.5 kg   Height:        Intake/Output Summary (Last 24 hours) at 03/08/2021 1117 Last data filed at 03/08/2021 0900 Gross per 24 hour  Intake 1380 ml  Output 2350 ml  Net -970 ml   Last 3 Weights 03/08/2021 03/07/2021 03/06/2021  Weight (lbs) 144 lb 6.4 oz 152 lb 5.4 oz 139 lb 15.9 oz  Weight (kg) 65.5 kg 69.1 kg 63.5 kg      Telemetry    Normal sinus rhythm occasional PVCs- Personally Reviewed  ECG    No new tracing- Personally Reviewed  Physical Exam  Very lean, appears more comfortable than yesterday, but slightly diaphoretic GEN: No acute distress.   Neck: 8 cm JVD Cardiac: RRR, no murmurs, rubs, S3 present Respiratory: Clear to auscultation bilaterally. GI: Soft, nontender, non-distended  MS: No edema; No deformity. Neuro:  Nonfocal  Psych: Normal affect   Labs    High Sensitivity Troponin:   Recent Labs  Lab 03/05/21 1130 03/05/21 1350  TROPONINIHS  47* 47*     Chemistry Recent Labs  Lab 03/05/21 1130 03/05/21 1737 03/06/21 0057 03/07/21 0105 03/08/21 0549  NA  --   --  137 135 136  K  --   --  3.4* 4.8 3.7  CL  --   --  99 102 103  CO2  --   --  26 24 24   GLUCOSE  --   --  147* 115* 102*  BUN  --   --  23* 20 21*  CREATININE  --   --  1.47* 1.31* 1.20  CALCIUM  --   --  9.5 9.7 9.6  MG  --    < > 1.7 1.9 1.8  PROT 7.4  --   --  6.6  --   ALBUMIN 4.3  --   --  3.8  --   AST 29  --   --  33  --   ALT 18  --   --  17  --   ALKPHOS 77  --   --  95  --   BILITOT 2.4*  --   --  0.9  --   GFRNONAA  --   --  56* >60 >60  ANIONGAP  --   --  12 9 9    < > =  values in this interval not displayed.    Lipids No results for input(s): CHOL, TRIG, HDL, LABVLDL, LDLCALC, CHOLHDL in the last 168 hours.  Hematology Recent Labs  Lab 03/05/21 1024 03/06/21 0057 03/07/21 0105  WBC 9.0 5.2 5.1  RBC 3.94* 3.69* 3.85*  HGB 13.6 12.8* 13.5  HCT 40.1 36.8* 39.1  MCV 101.8* 99.7 101.6*  MCH 34.5* 34.7* 35.1*  MCHC 33.9 34.8 34.5  RDW 12.4 12.1 12.4  PLT 170 160 157   Thyroid No results for input(s): TSH, FREET4 in the last 168 hours.  BNP Recent Labs  Lab 03/05/21 1024  BNP >4,500.0*    DDimer  Recent Labs  Lab 03/05/21 1130  DDIMER 1.10*     Radiology    No results found.  Cardiac Studies   Echo 2/15    1. Left ventricular ejection fraction, by estimation, is <20%. The left ventricle has severely decreased function. The left ventricle demonstrates global hypokinesis. No LV thrombus. The left ventricular internal cavity size was severely dilated. Left  ventricular diastolic parameters are consistent with Grade II diastolic dysfunction (pseudonormalization). The average left ventricular global longitudinal strain is -4.7 %. The global longitudinal strain is abnormal.   2. Right ventricular systolic function is moderately reduced. The right ventricular size is mildly enlarged. There is normal pulmonary artery systolic  pressure. The estimated right ventricular systolic pressure is  Q000111Q mmHg.   3. Left atrial size was severely dilated.   4. Right atrial size was mildly dilated.   5. The mitral valve is normal in structure. Trivial mitral valve  regurgitation. No evidence of mitral stenosis.   6. The aortic valve is tricuspid. Aortic valve regurgitation is not  visualized. No aortic stenosis is present.   7. The inferior vena cava is dilated in size with >50% respiratory  variability, suggesting right atrial pressure of 8 mmHg.   Patient Profile     56 y.o. male with nonischemic cardiomyopathy and heart failure with severely reduced ejection fraction, ICD (Reform 2019), history of stroke, hypertension, remote history of cocaine use, presenting with acute heart failure exacerbation  Assessment & Plan    Improving heart failure symptoms and clinical findings, JVD not as prominent but he still has a third heart sound.  No edema. Creatinine continues to improve with diuresis, but not back to baseline (0.8 - 0.9). Continues to present as "warm and wet".  We will continue intravenous diuretics and it seems we may be able to avoid use of inotropic support. Had to hold Entresto and reduce beta-blocker dose due to symptomatic hypotension.  These need to be restarted once diuresis is complete.  Note that although the prescription for metoprolol succinate was 150 mg once daily he was only taking 100 mg once daily since the higher dose made him feel dizzy and diaphoretic.     For questions or updates, please contact [place] Please consult [email or web address] for contact info under        Signed, Sanda Klein, MD  03/08/2021, 11:17 AM
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
## 2021-03-08 NOTE — Plan of Care (Signed)

## 2021-03-08 NOTE — Progress Notes (Signed)
Paged by RN regarding ST elevation noted on telemetry.  Evaluated patient at bedside. Patient was sitting up at the side of his bed, drinking soda. Stated that he was feeling well. Had no complaints. He denied any chest pain or tightness. Reports he had recently ambulated about the room without any chest discomfort either. No shortness of breath. No dizziness or lightheadedness.   General: alert and oriented, no acute distress Cardiac: regular rate and rhythm, no appreciable murmur, pulses intact distally, extremities warm and well perfused. Chest: clear to ascultation in all lung fields, normal work of breathing, no wheezes, rales, or rhonchi.   EKG obtained at bedside. Normal sinus rhythm with regular rate. T wave inversions noted in lead 1. No evidence of congruent ST elevation or depression.   A/P: Patient is not in acute distress, awake, conversant, and having no chest pain. EKG is reassuring for absence of acute cardiac event. Will not obtain troponins at this time. Current to monitor on telemetry.

## 2021-03-09 DIAGNOSIS — I5021 Acute systolic (congestive) heart failure: Secondary | ICD-10-CM

## 2021-03-09 LAB — CBC
HCT: 37.5 % — ABNORMAL LOW (ref 39.0–52.0)
Hemoglobin: 13.4 g/dL (ref 13.0–17.0)
MCH: 35.7 pg — ABNORMAL HIGH (ref 26.0–34.0)
MCHC: 35.7 g/dL (ref 30.0–36.0)
MCV: 100 fL (ref 80.0–100.0)
Platelets: 157 10*3/uL (ref 150–400)
RBC: 3.75 MIL/uL — ABNORMAL LOW (ref 4.22–5.81)
RDW: 12.1 % (ref 11.5–15.5)
WBC: 6.5 10*3/uL (ref 4.0–10.5)
nRBC: 0 % (ref 0.0–0.2)

## 2021-03-09 LAB — BASIC METABOLIC PANEL WITH GFR
Anion gap: 11 (ref 5–15)
BUN: 23 mg/dL — ABNORMAL HIGH (ref 6–20)
CO2: 27 mmol/L (ref 22–32)
Calcium: 9.9 mg/dL (ref 8.9–10.3)
Chloride: 97 mmol/L — ABNORMAL LOW (ref 98–111)
Creatinine, Ser: 1.35 mg/dL — ABNORMAL HIGH (ref 0.61–1.24)
GFR, Estimated: >60 mL/min
Glucose, Bld: 102 mg/dL — ABNORMAL HIGH (ref 70–99)
Potassium: 3.6 mmol/L (ref 3.5–5.1)
Sodium: 135 mmol/L (ref 135–145)

## 2021-03-09 MED ORDER — LOSARTAN POTASSIUM 25 MG PO TABS: 12.5000 mg | ORAL_TABLET | Freq: Every day | ORAL | Status: DC

## 2021-03-09 MED ORDER — DIGOXIN 125 MCG PO TABS
0.1250 mg | ORAL_TABLET | Freq: Every day | ORAL | Status: DC
  Administered 2021-03-09 – 2021-03-10 (×2): 0.125 mg via ORAL
  Filled 2021-03-09 (×2): qty 1

## 2021-03-09 MED ORDER — SACUBITRIL-VALSARTAN 24-26 MG PO TABS
1.0000 | ORAL_TABLET | Freq: Two times a day (BID) | ORAL | Status: DC
  Administered 2021-03-09 – 2021-03-10 (×3): 1 via ORAL
  Filled 2021-03-09 (×4): qty 1

## 2021-03-09 MED ORDER — DAPAGLIFLOZIN PROPANEDIOL 10 MG PO TABS
10.0000 mg | ORAL_TABLET | Freq: Every day | ORAL | Status: DC
  Administered 2021-03-09 – 2021-03-10 (×2): 10 mg via ORAL
  Filled 2021-03-09 (×2): qty 1

## 2021-03-09 MED ORDER — POTASSIUM CHLORIDE 20 MEQ PO PACK
40.0000 meq | PACK | Freq: Two times a day (BID) | ORAL | Status: AC
  Administered 2021-03-09: 40 meq via ORAL
  Filled 2021-03-09: qty 2

## 2021-03-09 NOTE — Plan of Care (Signed)

## 2021-03-09 NOTE — Progress Notes (Signed)
Cardiology Progress Note  Patient ID: [name] MRN: [id number] DOB: April 18, 1965 Date of Encounter: 03/09/2021  Primary Cardiologist: Skeet Latch, MD  Subjective   Chief Complaint: None.   HPI: Appears euvolemic. Denies SOB.   ROS:  All other ROS reviewed and negative. Pertinent positives noted in the HPI.     Inpatient Medications  Scheduled Meds:  dapagliflozin propanediol  10 mg Oral Daily   digoxin  0.125 mg Oral Daily   enoxaparin (LOVENOX) injection  40 mg Subcutaneous [id number]   folic acid  1 mg Oral Daily   losartan  12.5 mg Oral Daily   metoprolol succinate  50 mg Oral Daily   multivitamin with minerals  1 tablet Oral Daily   potassium chloride  40 mEq Oral BID   thiamine  100 mg Oral Daily   Or   thiamine  100 mg Intravenous Daily   Continuous Infusions:  PRN Meds: acetaminophen **OR** acetaminophen, albuterol, senna-docusate   Vital Signs   Vitals:   03/08/21 1923 03/08/21 2316 03/09/21 0349 03/09/21 0741  BP: 112/75 114/87 113/81 110/79  Pulse: 66 82 64 72  Resp: 18 18 18 20   Temp: 98.1 F (36.7 C) 98.1 F (36.7 C) 98 F (36.7 C) 97.6 F (36.4 C)  TempSrc: Oral Oral Oral Oral  SpO2: 97% 95% 94% 98%  Weight:   63.8 kg   Height:        Intake/Output Summary (Last 24 hours) at 03/09/2021 [id number] Last data filed at 03/09/2021 0450 Gross per 24 hour  Intake 1200 ml  Output 3750 ml  Net -2550 ml   Last 3 Weights 03/09/2021 03/08/2021 03/07/2021  Weight (lbs) 140 lb 10.5 oz 144 lb 6.4 oz 152 lb 5.4 oz  Weight (kg) 63.8 kg 65.5 kg 69.1 kg      Telemetry  Overnight telemetry shows sinus rhythm in the 60s, which I personally reviewed.   Physical Exam   Vitals:   03/08/21 1923 03/08/21 2316 03/09/21 0349 03/09/21 0741  BP: 112/75 114/87 113/81 110/79  Pulse: 66 82 64 72  Resp: 18 18 18 20   Temp: 98.1 F (36.7 C) 98.1 F (36.7 C) 98 F (36.7 C) 97.6 F (36.4 C)  TempSrc: Oral Oral Oral Oral  SpO2: 97% 95% 94% 98%  Weight:   63.8 kg    Height:        Intake/Output Summary (Last 24 hours) at 03/09/2021 [id number] Last data filed at 03/09/2021 0450 Gross per 24 hour  Intake 1200 ml  Output 3750 ml  Net -2550 ml    Last 3 Weights 03/09/2021 03/08/2021 03/07/2021  Weight (lbs) 140 lb 10.5 oz 144 lb 6.4 oz 152 lb 5.4 oz  Weight (kg) 63.8 kg 65.5 kg 69.1 kg    Body mass index is 21.39 kg/m.  General: Well nourished, well developed, in no acute distress Head: Atraumatic, normal size  Eyes: PEERLA, EOMI  Neck: Supple, no JVD Endocrine: No thryomegaly Cardiac: Normal S1, S2; RRR; no murmurs, rubs, or gallops Lungs: Clear to auscultation bilaterally, no wheezing, rhonchi or rales  Abd: Soft, nontender, no hepatomegaly  Ext: No edema, pulses 2+ Musculoskeletal: No deformities, BUE and BLE strength normal and equal Skin: Warm and dry, no rashes   Neuro: Alert and oriented to person, place, time, and situation, CNII-XII grossly intact, no focal deficits  Psych: Normal mood and affect   Labs  High Sensitivity Troponin:   Recent Labs  Lab 03/05/21 1130 03/05/21 1350  TROPONINIHS 47* 47*  Cardiac EnzymesNo results for input(s): TROPONINI in the last 168 hours. No results for input(s): TROPIPOC in the last 168 hours.  Chemistry Recent Labs  Lab 03/05/21 1130 03/06/21 0057 03/07/21 0105 03/08/21 0549 03/09/21 0051  NA  --    < > 135 136 135  K  --    < > 4.8 3.7 3.6  CL  --    < > 102 103 97*  CO2  --    < > 24 24 27   GLUCOSE  --    < > 115* 102* 102*  BUN  --    < > 20 21* 23*  CREATININE  --    < > 1.31* 1.20 1.35*  CALCIUM  --    < > 9.7 9.6 9.9  PROT 7.4  --  6.6  --   --   ALBUMIN 4.3  --  3.8  --   --   AST 29  --  33  --   --   ALT 18  --  17  --   --   ALKPHOS 77  --  95  --   --   BILITOT 2.4*  --  0.9  --   --   GFRNONAA  --    < > >60 >60 >60  ANIONGAP  --    < > 9 9 11    < > = values in this interval not displayed.    Hematology Recent Labs  Lab 03/06/21 0057 03/07/21 0105 03/09/21 0051  WBC  5.2 5.1 6.5  RBC 3.69* 3.85* 3.75*  HGB 12.8* 13.5 13.4  HCT 36.8* 39.1 37.5*  MCV 99.7 101.6* 100.0  MCH 34.7* 35.1* 35.7*  MCHC 34.8 34.5 35.7  RDW 12.1 12.4 12.1  PLT 160 157 157   BNP Recent Labs  Lab 03/05/21 1024  BNP >4,500.0*    DDimer  Recent Labs  Lab 03/05/21 1130  DDIMER 1.10*     Radiology  No results found.  Cardiac Studies  TTE 03/06/2021  1. Left ventricular ejection fraction, by estimation, is <20%. The left  ventricle has severely decreased function. The left ventricle demonstrates  global hypokinesis. No LV thrombus. The left ventricular internal cavity  size was severely dilated. Left  ventricular diastolic parameters are consistent with Grade II diastolic  dysfunction (pseudonormalization). The average left ventricular global  longitudinal strain is -4.7 %. The global longitudinal strain is abnormal.   2. Right ventricular systolic function is moderately reduced. The right  ventricular size is mildly enlarged. There is normal pulmonary artery  systolic pressure. The estimated right ventricular systolic pressure is  [id number] mmHg.   3. Left atrial size was severely dilated.   4. Right atrial size was mildly dilated.   5. The mitral valve is normal in structure. Trivial mitral valve  regurgitation. No evidence of mitral stenosis.   6. The aortic valve is tricuspid. Aortic valve regurgitation is not  visualized. No aortic stenosis is present.   7. The inferior vena cava is dilated in size with >50% respiratory  variability, suggesting right atrial pressure of 8 mmHg.     Patient Profile  [name] is a 56 y.o. male with polysubstance abuse (cocaine), nonischemic cardiomyopathy status post ICD, stroke, hypertension who was admitted on 03/05/2021 for acute on chronic systolic heart failure.  Assessment & Plan   #Acute on chronic systolic heart failure, EF less than 20% #Nonischemic #Substance abuse #ICD -Admitted for IV diuresis in the  setting of  volume overload.  He is effectively euvolemic.  Net -2.1 L overnight.  Net -4.6 L since admission. -BP is stable.  On metoprolol succinate 50 mg daily. -EF is very low.  We will add digoxin 0.125 mg daily.  This will help him get on GDMT. -Add back Entresto 24-26 mg twice daily.  Suspect kidney function will continue to improve. -Add Farxiga 10 mg daily. -Plan to add p.o. Lasix and likely Aldactone tomorrow pending kidney function. -Known history of cocaine abuse.  NM Stress negative 2018. Not a candidate for advanced therapies.  Digoxin is really all we can offer him.  #AKI -suspect cardiorenal. Add afterload reduction (entresto) as this should help.   For questions or updates, please contact [place] Please consult [email or web address] for contact info under     Signed, [place] T. Audie Box, MD, [place]  03/09/2021 9:22 AM
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
## 2021-03-10 DIAGNOSIS — I5041 Acute combined systolic (congestive) and diastolic (congestive) heart failure: Secondary | ICD-10-CM

## 2021-03-10 LAB — BASIC METABOLIC PANEL
Anion gap: 9 (ref 5–15)
BUN: 24 mg/dL — ABNORMAL HIGH (ref 6–20)
CO2: 25 mmol/L (ref 22–32)
Calcium: 9.5 mg/dL (ref 8.9–10.3)
Chloride: 100 mmol/L (ref 98–111)
Creatinine, Ser: 1.46 mg/dL — ABNORMAL HIGH (ref 0.61–1.24)
GFR, Estimated: 56 mL/min — ABNORMAL LOW (ref >60)
Glucose, Bld: 105 mg/dL — ABNORMAL HIGH (ref 70–99)
Potassium: 4.1 mmol/L (ref 3.5–5.1)
Sodium: 134 mmol/L — ABNORMAL LOW (ref 135–145)

## 2021-03-10 MED ORDER — SPIRONOLACTONE 12.5 MG HALF TABLET
12.5000 mg | ORAL_TABLET | Freq: Every day | ORAL | Status: DC
Start: 2021-03-10 — End: 2021-03-10
  Administered 2021-03-10: 12.5 mg via ORAL
  Filled 2021-03-10: qty 1

## 2021-03-10 MED ORDER — DIGOXIN 125 MCG PO TABS: 0.1250 mg | ORAL_TABLET | Freq: Every day | ORAL | 2 refills | Status: DC

## 2021-03-10 MED ORDER — FUROSEMIDE 40 MG PO TABS
40.0000 mg | ORAL_TABLET | Freq: Every day | ORAL | Status: DC
  Administered 2021-03-10: 40 mg via ORAL
  Filled 2021-03-10: qty 1

## 2021-03-10 MED ORDER — SACUBITRIL-VALSARTAN 24-26 MG PO TABS: 1.0000 | ORAL_TABLET | Freq: Two times a day (BID) | ORAL | 2 refills | Status: DC

## 2021-03-10 MED ORDER — SPIRONOLACTONE 25 MG PO TABS: 12.5000 mg | ORAL_TABLET | Freq: Every day | ORAL | 2 refills | Status: DC

## 2021-03-10 MED ORDER — FUROSEMIDE 40 MG PO TABS: ORAL_TABLET | ORAL | 1 refills | Status: DC

## 2021-03-10 MED ORDER — METOPROLOL SUCCINATE ER 50 MG PO TB24: 50.0000 mg | ORAL_TABLET | Freq: Every day | ORAL | 2 refills | Status: DC

## 2021-03-10 MED ORDER — DAPAGLIFLOZIN PROPANEDIOL 10 MG PO TABS: 10.0000 mg | ORAL_TABLET | Freq: Every day | ORAL | 3 refills | Status: DC

## 2021-03-10 NOTE — Discharge Summary (Signed)
Name: [name] MRN: [id number] DOB: August 20, 1965 56 y.o. PCP: [name], MD  Date of Admission: 03/05/2021 10:05 AM Date of Discharge: 03/10/21 Attending Physician: Dr. Angelia Mould  Discharge Diagnosis: Principal Problem:   Acute exacerbation of CHF (congestive heart failure) ([place]) Active Problems:   AKI (acute kidney injury) ([place])   Acute combined systolic and diastolic congestive heart failure [facility])    Discharge Medications: Allergies as of 03/10/2021       Reactions   Penicillins Other (See Comments)   Blisters  Has patient had a PCN reaction causing immediate rash, facial/tongue/throat swelling, SOB or lightheadedness with hypotension: Yes Has patient had a PCN reaction causing severe rash involving mucus membranes or skin necrosis: Yes Has patient had a PCN reaction that required hospitalization: No Has patient had a PCN reaction occurring within the last 10 years: No If all of the above answers are "NO", then may proceed with Cephalosporin use.        Medication List     STOP taking these medications    Entresto 97-103 MG Generic drug: sacubitril-valsartan Replaced by: sacubitril-valsartan 24-26 MG       TAKE these medications    albuterol 108 (90 Base) MCG/ACT inhaler Commonly known as: ProAir HFA Inhale 2 puffs into the lungs every 6 (six) hours as needed for shortness of breath. What changed:  how much to take when to take this additional instructions   aspirin 81 MG EC tablet Take 81 mg by mouth daily. Swallow whole.   cetirizine 10 MG tablet Commonly known as: ZYRTEC TAKE 1 TABLET (10 MG TOTAL) BY MOUTH DAILY. What changed:  when to take this reasons to take this   dapagliflozin propanediol 10 MG Tabs tablet Commonly known as: FARXIGA Take 1 tablet (10 mg total) by mouth daily. Start taking on: March 11, 2021   digoxin 0.125 MG tablet Commonly known as: LANOXIN Take 1 tablet (0.125 mg total) by mouth daily. Start taking  on: March 11, 2021   furosemide 40 MG tablet Commonly known as: LASIX TAKE 1 TABLET daily What changed: additional instructions   metoprolol succinate 50 MG 24 hr tablet Commonly known as: TOPROL-XL Take 1 tablet (50 mg total) by mouth daily. Take with or immediately following a meal. Start taking on: March 11, 2021 What changed:  medication strength how much to take   sacubitril-valsartan 24-26 MG Commonly known as: ENTRESTO Take 1 tablet by mouth 2 (two) times daily. Replaces: Entresto 97-103 MG   Spiriva HandiHaler 18 MCG inhalation capsule Generic drug: tiotropium Place 1 capsule (18 mcg total) into inhaler and inhale daily.   spironolactone 25 MG tablet Commonly known as: ALDACTONE Take 0.5 tablets (12.5 mg total) by mouth daily. Start taking on: March 11, 2021        Disposition and follow-up:   [name] was discharged from [facility] in Stable condition.  At the hospital follow up visit please address:  1.  Follow-up:  a. Heart failure with reduced ejection fraction - worsened EF noted on ECHO this admission. Discharged on digoxin, entresto 24-26 BID, metoprolol 50, aldactone 12.5, and farxiga 10. Also has lasix 40mg .     b. AKI - follow up with BMP   2.  Labs / imaging needed at time of follow-up: BMP, CBC  3.  Pending labs/ test needing follow-up: none  4.  Medication Changes  Started: digoxin, aldactone, farxiga  Stopped: none  Changed: entresto, metoprolol, lasix  Follow-up Appointments: PCP, cardiology  Hospital Course by problem list:   HFrEF - Patient [facility] ED with complaints of orthopnea and dyspnea. ECHO showed EF less than 20%. He was given IV lasix with initial rise in creatinine. [name] was held for this reason. Cardiology was consulted and he was felt to be "warm and wet". He was given another dose of lasix with improvement in symptoms and renal function. He was diuresed back to euvolemia.  Metoprolol and entresto were able to be restarted at lower doses. Patient was started on digoxin, farxiga, and aldactone and discharged home with instruction for close follow up with either his PCP or cardiologist.    AKI - patient noted to have elevation in his Cr and BUN. Intially unsure as to whether prerenal or cardiorenal. Elevation in his cr and bun were noted after initial dose of lasix, but he did show improvement after further diuresis. [name] was initially held, but was able to be restarted at a lower dose. He was discharged home with instruction for cclose follow up with BMP to check renal function.     Discharge Subjective: Patient states he feels well. Slept well, able to lie flat. No CP, no SOB or dizziness. No complaints..   Discharge Exam:   BP 110/76 (BP Location: Right Arm)    Pulse 70    Temp 97.6 F (36.4 C) (Oral)    Resp 20    Ht 5\' 8"  (1.727 m)    Wt 67 kg    SpO2 98%    BMI 22.46 kg/m  Constitutional: well-appearing male sitting in bed, in no acute distress HENT: normocephalic atraumatic, mucous membranes moist Eyes: conjunctiva non-erythematous Neck: supple Cardiovascular: regular rate and rhythm, no m/r/g Pulmonary/Chest: normal work of breathing on room air, lungs clear to auscultation bilaterally Abdominal: soft, non-tender, non-distended MSK: normal bulk and tone Neurological: alert & oriented x 3, 5/5 strength in bilateral upper and lower extremities, normal gait Skin: warm and dry Psych: mood and affect appropriate   Pertinent Labs, Studies, and Procedures:  CBC Latest Ref Rng & Units 03/09/2021 03/07/2021 03/06/2021  WBC 4.0 - 10.5 K/uL 6.5 5.1 5.2  Hemoglobin 13.0 - 17.0 g/dL 13.4 13.5 12.8(L)  Hematocrit 39.0 - 52.0 % 37.5(L) 39.1 36.8(L)  Platelets 150 - 400 K/uL 157 157 160    CMP Latest Ref Rng & Units 03/10/2021 03/09/2021 03/08/2021  Glucose 70 - 99 mg/dL 105(H) 102(H) 102(H)  BUN 6 - 20 mg/dL 24(H) 23(H) 21(H)  Creatinine 0.61 - 1.24 mg/dL  1.46(H) 1.35(H) 1.20  Sodium 135 - 145 mmol/L 134(L) 135 136  Potassium 3.5 - 5.1 mmol/L 4.1 3.6 3.7  Chloride 98 - 111 mmol/L 100 97(L) 103  CO2 22 - 32 mmol/L 25 27 24   Calcium 8.9 - 10.3 mg/dL 9.5 9.9 9.6  Total Protein 6.5 - 8.1 g/dL - - -  Total Bilirubin 0.3 - 1.2 mg/dL - - -  Alkaline Phos 38 - 126 U/L - - -  AST 15 - 41 U/L - - -  ALT 0 - 44 U/L - - -    DG Chest 2 View  Result Date: 03/05/2021 CLINICAL DATA:  Shortness of breath, COPD, heart failure EXAM: CHEST - 2 VIEW COMPARISON:  05/23/2020 FINDINGS: Cardiomegaly. Left chest single lead defibrillator. Both lungs are clear. The visualized skeletal structures are unremarkable. IMPRESSION: Cardiomegaly. No acute abnormality of the lungs. Electronically Signed   By: Delanna Ahmadi M.D.   On: 03/05/2021 10:39  CT Angio Chest PE W and/or Wo Contrast  Result Date: 03/05/2021 CLINICAL DATA:  Shortness of breath, PE suspected EXAM: CT ANGIOGRAPHY CHEST WITH CONTRAST TECHNIQUE: Multidetector CT imaging of the chest was performed using the standard protocol during bolus administration of intravenous contrast. Multiplanar CT image reconstructions and MIPs were obtained to evaluate the vascular anatomy. RADIATION DOSE REDUCTION: This exam was performed according to the departmental dose-optimization program which includes automated exposure control, adjustment of the mA and/or kV according to patient size and/or use of iterative reconstruction technique. CONTRAST:  OMNIPAQUE IOHEXOL 350 MG/ML SOLN COMPARISON:  None. FINDINGS: Cardiovascular: Satisfactory opacification of the pulmonary arteries to the segmental level. No evidence of pulmonary embolism. Enlargement of the main pulmonary artery, measuring up to 3.5 cm, as can be seen in the setting of pulmonary hypertension. Cardiomegaly. Reflux of contrast into the hepatic veins, suggestive of right heart dysfunction. Left chest cardiac device with lead in the right ventricle.  Mediastinum/Nodes: No enlarged mediastinal, hilar, or axillary lymph nodes. Thyroid gland, trachea, and esophagus demonstrate no significant findings. Lungs/Pleura: Bibasilar atelectasis with some interlobular septal thickening and mild ground-glass opacities, suggesting mild pulmonary edema. No pleural effusion or pneumothorax. Upper Abdomen: Aortic atherosclerosis. Enlargement of the left-greater-than-right adrenal gland, which are poorly evaluated. Musculoskeletal: Left chest cardiac device. No acute chest wall abnormality. No acute or significant osseous findings. Review of the MIP images confirms the above findings. IMPRESSION: 1. No evidence of pulmonary embolism. 2. Cardiomegaly, with reflux of contrast into the hepatic veins suggesting right heart dysfunction in enlargement of the main pulmonary artery suggesting pulmonary hypertension and likely left heart dysfunction. Correlate with BNP. 3. Mild interlobular septal thickening and scattered ground-glass opacities in the lung bases, suggesting mild pulmonary edema. No pleural effusion. 4. Enlargement of the left-greater-than-right adrenal gland, which is nonspecific and poorly evaluated. Consider nonemergent CT abdomen pelvis for further evaluation. Electronically Signed   By: Wiliam Ke M.D.   On: 03/05/2021 13:35   US RENAL  Result Date: 03/06/2021 CLINICAL DATA:  Acute renal insufficiency EXAM: RENAL / URINARY TRACT ULTRASOUND COMPLETE COMPARISON:  Right upper quadrant ultrasound of 1 day prior. FINDINGS: Right Kidney: Renal measurements: 10.8 x 4.3 x 4.4 cm = volume: 106 mL. Echogenicity within normal limits. No mass or hydronephrosis visualized. Left Kidney: Renal measurements: 11.0 x 5.6 x 4.8 cm = volume: 156 mL. Echogenicity within normal limits. No mass or hydronephrosis visualized. Bladder: Decompressed. Apparent wall thickening is at least partially felt to be secondary. Other: None. IMPRESSION: 1. Normal appearance of the kidneys. 2.  Decompressed urinary bladder. Apparent wall thickening is at least partially felt to be secondary. Cystitis or bladder outlet obstruction could look similar. Electronically Signed   By: Jeronimo Greaves M.D.   On: 03/06/2021 10:02   ECHOCARDIOGRAM COMPLETE  Result Date: 03/06/2021    ECHOCARDIOGRAM REPORT   Patient Name:   [name] Date of Exam: 03/06/2021 Medical Rec #:  [id number]        Height:       68.0 in Accession #:    [id number]       Weight:       140.0 lb Date of Birth:  [birth year]        BSA:          1.756 m Patient Age:    55 years         BP:           98/73 mmHg Patient Gender: M  HR:           64 bpm. Exam Location:  Inpatient Procedure: 2D Echo, Cardiac Doppler, Color Doppler, Strain Analysis and            Intracardiac Opacification Agent Indications:    CHF  History:        Patient has prior history of Echocardiogram examinations, most                 recent 11/11/2017. CHF, Stroke; Risk Factors:Hypertension.                 01/18/2018 ICD                 06/03/2019 right heart cath.  Sonographer:    Luisa Hart RDCS Referring Phys: [place]  1. Left ventricular ejection fraction, by estimation, is <20%. The left ventricle has severely decreased function. The left ventricle demonstrates global hypokinesis. No LV thrombus. The left ventricular internal cavity size was severely dilated. Left ventricular diastolic parameters are consistent with Grade II diastolic dysfunction (pseudonormalization). The average left ventricular global longitudinal strain is -4.7 %. The global longitudinal strain is abnormal.  2. Right ventricular systolic function is moderately reduced. The right ventricular size is mildly enlarged. There is normal pulmonary artery systolic pressure. The estimated right ventricular systolic pressure is Q000111Q mmHg.  3. Left atrial size was severely dilated.  4. Right atrial size was mildly dilated.  5. The mitral valve is normal in  structure. Trivial mitral valve regurgitation. No evidence of mitral stenosis.  6. The aortic valve is tricuspid. Aortic valve regurgitation is not visualized. No aortic stenosis is present.  7. The inferior vena cava is dilated in size with >50% respiratory variability, suggesting right atrial pressure of 8 mmHg. FINDINGS  Left Ventricle: Left ventricular ejection fraction, by estimation, is <20%. The left ventricle has severely decreased function. The left ventricle demonstrates global hypokinesis. Definity contrast agent was given IV to delineate the left ventricular endocardial borders. The average left ventricular global longitudinal strain is -4.7 %. The global longitudinal strain is abnormal. The left ventricular internal cavity size was severely dilated. There is no left ventricular hypertrophy. Left ventricular  diastolic parameters are consistent with Grade II diastolic dysfunction (pseudonormalization). Right Ventricle: The right ventricular size is mildly enlarged. No increase in right ventricular wall thickness. Right ventricular systolic function is moderately reduced. There is normal pulmonary artery systolic pressure. The tricuspid regurgitant velocity is 2.62 m/s, and with an assumed right atrial pressure of 8 mmHg, the estimated right ventricular systolic pressure is Q000111Q mmHg. Left Atrium: Left atrial size was severely dilated. Right Atrium: Right atrial size was mildly dilated. Pericardium: Trivial pericardial effusion is present. Mitral Valve: The mitral valve is normal in structure. Trivial mitral valve regurgitation. No evidence of mitral valve stenosis. Tricuspid Valve: The tricuspid valve is normal in structure. Tricuspid valve regurgitation is trivial. Aortic Valve: The aortic valve is tricuspid. Aortic valve regurgitation is not visualized. No aortic stenosis is present. Aortic valve mean gradient measures 2.0 mmHg. Aortic valve peak gradient measures 3.2 mmHg. Aortic valve area, by VTI  measures 3.60 cm. Pulmonic Valve: The pulmonic valve was normal in structure. Pulmonic valve regurgitation is not visualized. Aorta: The aortic root is normal in size and structure. Venous: The inferior vena cava is dilated in size with greater than 50% respiratory variability, suggesting right atrial pressure of 8 mmHg. IAS/Shunts: No atrial level shunt detected by color flow Doppler. Additional Comments:  A device lead is visualized in the right ventricle.  LEFT VENTRICLE PLAX 2D LVIDd:         7.10 cm      Diastology LVIDs:         7.20 cm      LV e' medial:    3.45 cm/s LV PW:         0.90 cm      LV E/e' medial:  19.4 LV IVS:        0.90 cm      LV e' lateral:   4.20 cm/s LVOT diam:     2.60 cm      LV E/e' lateral: 16.0 LV SV:         51 LV SV Index:   29           2D Longitudinal Strain LVOT Area:     5.31 cm     2D Strain GLS Avg:     -4.7 %  LV Volumes (MOD) LV vol d, MOD A2C: 216.0 ml LV vol d, MOD A4C: 227.0 ml LV vol s, MOD A2C: 165.0 ml LV vol s, MOD A4C: 177.0 ml LV SV MOD A2C:     51.0 ml LV SV MOD A4C:     227.0 ml LV SV MOD BP:      49.3 ml RIGHT VENTRICLE RV Basal diam:  4.20 cm RV Mid diam:    2.70 cm RV S prime:     4.65 cm/s TAPSE (M-mode): 1.3 cm LEFT ATRIUM           Index        RIGHT ATRIUM           Index LA diam:      4.30 cm 2.45 cm/m   RA Area:     14.70 cm LA Vol (A2C): 91.4 ml 52.04 ml/m  RA Volume:   33.50 ml  19.07 ml/m  AORTIC VALVE                    PULMONIC VALVE AV Area (Vmax):    3.79 cm     PV Vmax:       0.53 m/s AV Area (Vmean):   3.48 cm     PV Vmean:      35.250 cm/s AV Area (VTI):     3.60 cm     PV VTI:        0.106 m AV Vmax:           89.65 cm/s   PV Peak grad:  1.1 mmHg AV Vmean:          59.750 cm/s  PV Mean grad:  1.0 mmHg AV VTI:            0.140 m AV Peak Grad:      3.2 mmHg AV Mean Grad:      2.0 mmHg LVOT Vmax:         63.95 cm/s LVOT Vmean:        39.150 cm/s LVOT VTI:          0.095 m LVOT/AV VTI ratio: 0.68  AORTA Ao Root diam: 3.60 cm Ao Asc diam:   3.10 cm MITRAL VALVE               TRICUSPID VALVE MV Area (PHT): 7.37 cm    TR Peak grad:   27.5 mmHg MV Decel Time: 103 msec    TR Vmax:  262.00 cm/s MR Peak grad: 44.1 mmHg MR Mean grad: 31.0 mmHg    SHUNTS MR Vmax:      332.00 cm/s  Systemic VTI:  0.10 m MR Vmean:     271.0 cm/s   Systemic Diam: 2.60 cm MV E velocity: 67.10 cm/s MV A velocity: 35.30 cm/s MV E/A ratio:  1.90 Dalton McleanMD Electronically signed by Franki Monte Signature Date/Time: 03/06/2021/3:15:22 PM    Final    US Abdomen Limited RUQ (LIVER/GB)  Result Date: 03/05/2021 CLINICAL DATA:  Hyperbilirubinemia EXAM: ULTRASOUND ABDOMEN LIMITED RIGHT UPPER QUADRANT COMPARISON:  Ultrasound 06/02/2019 FINDINGS: Gallbladder: No gallstones or wall thickening visualized. No sonographic Murphy sign noted by sonographer. Common bile duct: Diameter: 4.5 mm Liver: Liver slightly echogenic. No focal hepatic abnormality. Portal vein is patent on color Doppler imaging with normal direction of blood flow towards the liver. Other: None. IMPRESSION: 1. Negative for biliary dilatation 2. Liver slightly echogenic suggesting steatosis Electronically Signed   By: Donavan Foil M.D.   On: 03/05/2021 18:37     Discharge Instructions: Discharge Instructions     (HEART FAILURE PATIENTS) Call MD:  Anytime you have any of the following symptoms: 1) 3 pound weight gain in 24 hours or 5 pounds in 1 week 2) shortness of breath, with or without a dry hacking cough 3) swelling in the hands, feet or stomach 4) if you have to sleep on extra pillows at night in order to breathe.   Complete by: As directed    Call MD for:  difficulty breathing, headache or visual disturbances   Complete by: As directed    Call MD for:  temperature >100.4   Complete by: As directed    Diet - low sodium heart healthy   Complete by: As directed    Increase activity slowly   Complete by: As directed        Signed: Delene Ruffini, MD 03/10/2021, 9:22 PM   Pager:  [phone]

## 2021-03-10 NOTE — Progress Notes (Signed)
Cardiology Progress Note  Patient ID: [name] MRN: [id number] DOB: 11/16/1965 Date of Encounter: 03/10/2021  Primary Cardiologist: Skeet Latch, MD  Subjective   Chief Complaint: None.   HPI: Denies symptoms.  On appropriate heart failure medications.  Euvolemic.  Ready for discharge.  ROS:  All other ROS reviewed and negative. Pertinent positives noted in the HPI.     Inpatient Medications  Scheduled Meds:  dapagliflozin propanediol  10 mg Oral Daily   digoxin  0.125 mg Oral Daily   enoxaparin (LOVENOX) injection  40 mg Subcutaneous [id number]   folic acid  1 mg Oral Daily   metoprolol succinate  50 mg Oral Daily   multivitamin with minerals  1 tablet Oral Daily   sacubitril-valsartan  1 tablet Oral BID   spironolactone  12.5 mg Oral Daily   thiamine  100 mg Oral Daily   Or   thiamine  100 mg Intravenous Daily   Continuous Infusions:  PRN Meds: acetaminophen **OR** acetaminophen, albuterol, senna-docusate   Vital Signs   Vitals:   03/09/21 2019 03/10/21 0017 03/10/21 0500 03/10/21 0828  BP: 111/78 120/77 117/89 107/79  Pulse: 61 77 61 65  Resp: 18 20 20 20   Temp: 98.6 F (37 C) 97.9 F (36.6 C) 98 F (36.7 C) 97.9 F (36.6 C)  TempSrc: Oral Oral Oral Oral  SpO2: 95% 93% 99% 96%  Weight:   67 kg   Height:        Intake/Output Summary (Last 24 hours) at 03/10/2021 0901 Last data filed at 03/09/2021 2019 Gross per 24 hour  Intake 960 ml  Output 450 ml  Net 510 ml   Last 3 Weights 03/10/2021 03/09/2021 03/08/2021  Weight (lbs) 147 lb 11.3 oz 140 lb 10.5 oz 144 lb 6.4 oz  Weight (kg) 67 kg 63.8 kg 65.5 kg      Telemetry  Overnight telemetry shows sinus rhythm in the 60s, which I personally reviewed.   Physical Exam   Vitals:   03/09/21 2019 03/10/21 0017 03/10/21 0500 03/10/21 0828  BP: 111/78 120/77 117/89 107/79  Pulse: 61 77 61 65  Resp: 18 20 20 20   Temp: 98.6 F (37 C) 97.9 F (36.6 C) 98 F (36.7 C) 97.9 F (36.6 C)  TempSrc: Oral  Oral Oral Oral  SpO2: 95% 93% 99% 96%  Weight:   67 kg   Height:        Intake/Output Summary (Last 24 hours) at 03/10/2021 0901 Last data filed at 03/09/2021 2019 Gross per 24 hour  Intake 960 ml  Output 450 ml  Net 510 ml    Last 3 Weights 03/10/2021 03/09/2021 03/08/2021  Weight (lbs) 147 lb 11.3 oz 140 lb 10.5 oz 144 lb 6.4 oz  Weight (kg) 67 kg 63.8 kg 65.5 kg    Body mass index is 22.46 kg/m.   General: Well nourished, well developed, in no acute distress Head: Atraumatic, normal size  Eyes: PEERLA, EOMI  Neck: Supple, no JVD Endocrine: No thryomegaly Cardiac: Normal S1, S2; RRR; no murmurs, rubs, or gallops Lungs: Clear to auscultation bilaterally, no wheezing, rhonchi or rales  Abd: Soft, nontender, no hepatomegaly  Ext: No edema, pulses 2+ Musculoskeletal: No deformities, BUE and BLE strength normal and equal Skin: Warm and dry, no rashes   Neuro: Alert and oriented to person, place, time, and situation, CNII-XII grossly intact, no focal deficits  Psych: Normal mood and affect   Labs  High Sensitivity Troponin:   Recent Labs  Lab  03/05/21 1130 03/05/21 1350  TROPONINIHS 47* 47*     Cardiac EnzymesNo results for input(s): TROPONINI in the last 168 hours. No results for input(s): TROPIPOC in the last 168 hours.  Chemistry Recent Labs  Lab 03/05/21 1130 03/06/21 0057 03/07/21 0105 03/08/21 0549 03/09/21 0051 03/10/21 0033  NA  --    < > 135 136 135 134*  K  --    < > 4.8 3.7 3.6 4.1  CL  --    < > 102 103 97* 100  CO2  --    < > 24 24 27 25   GLUCOSE  --    < > 115* 102* 102* 105*  BUN  --    < > 20 21* 23* 24*  CREATININE  --    < > 1.31* 1.20 1.35* 1.46*  CALCIUM  --    < > 9.7 9.6 9.9 9.5  PROT 7.4  --  6.6  --   --   --   ALBUMIN 4.3  --  3.8  --   --   --   AST 29  --  33  --   --   --   ALT 18  --  17  --   --   --   ALKPHOS 77  --  95  --   --   --   BILITOT 2.4*  --  0.9  --   --   --   GFRNONAA  --    < > >60 >60 >60 56*  ANIONGAP  --    < > 9  9 11 9    < > = values in this interval not displayed.    Hematology Recent Labs  Lab 03/06/21 0057 03/07/21 0105 03/09/21 0051  WBC 5.2 5.1 6.5  RBC 3.69* 3.85* 3.75*  HGB 12.8* 13.5 13.4  HCT 36.8* 39.1 37.5*  MCV 99.7 101.6* 100.0  MCH 34.7* 35.1* 35.7*  MCHC 34.8 34.5 35.7  RDW 12.1 12.4 12.1  PLT 160 157 157   BNP Recent Labs  Lab 03/05/21 1024  BNP >4,500.0*    DDimer  Recent Labs  Lab 03/05/21 1130  DDIMER 1.10*     Radiology  No results found.  Cardiac Studies  TTE 03/06/2021  1. Left ventricular ejection fraction, by estimation, is <20%. The left  ventricle has severely decreased function. The left ventricle demonstrates  global hypokinesis. No LV thrombus. The left ventricular internal cavity  size was severely dilated. Left  ventricular diastolic parameters are consistent with Grade II diastolic  dysfunction (pseudonormalization). The average left ventricular global  longitudinal strain is -4.7 %. The global longitudinal strain is abnormal.   2. Right ventricular systolic function is moderately reduced. The right  ventricular size is mildly enlarged. There is normal pulmonary artery  systolic pressure. The estimated right ventricular systolic pressure is  [id number] mmHg.   3. Left atrial size was severely dilated.   4. Right atrial size was mildly dilated.   5. The mitral valve is normal in structure. Trivial mitral valve  regurgitation. No evidence of mitral stenosis.   6. The aortic valve is tricuspid. Aortic valve regurgitation is not  visualized. No aortic stenosis is present.   7. The inferior vena cava is dilated in size with >50% respiratory  variability, suggesting right atrial pressure of 8 mmHg.   Patient Profile  [name] is a 56 y.o. male with polysubstance abuse (cocaine), nonischemic cardiomyopathy status post ICD, stroke, hypertension who  was admitted on 03/05/2021 for acute on chronic systolic heart failure.  Assessment & Plan    #Acute on chronic systolic heart failure, EF less than 20% #Nonischemic cardiomyopathy #Substance abuse #ICD in place -Admitted for volume overload.  Euvolemic on exam.  Net -3.8 L since admission.  No need for further diuresis. -Blood pressures were soft.  Digoxin 0.125 mg daily was added to assist with GDMT.  Would continue this. -Continue metoprolol succinate 50 mg daily.  Entresto 24-26 mg twice daily was added back.  We will continue this dose.  On Aldactone 12.5 mg daily.  On Farxiga 10 mg daily. -Not a candidate for advanced therapies due to substance abuse.  Digoxin is the best we can offer him. -Would recommend he refrain from any drugs such as cocaine moving forward. -Can be discharged with Lasix 40 mg daily.  #AKI/CKD -Kidney function is stable.  This can be rechecked as an outpatient.  [facility] will sign off.   Medication Recommendations: As above. Other recommendations (labs, testing, etc): None. Follow up as an outpatient: He has a follow-up appointment on 03/14/2021.  For questions or updates, please contact [place] Please consult [email or web address] for contact info under   Signed, [place] T. Audie Box, MD, [place]  03/10/2021 9:01 AM

## 2021-03-10 NOTE — Discharge Instructions (Signed)
[name] it was a pleasure being involved in your care.   The cardiologist started you on multiple medications while you were here. Farxiga, Aldactone, and Digoxin. The instructions for these were sent to the pharmacy. We also reduced the dose of entresto, please do not take your home dose of this medication. We sent the new prescription of this medicine to your pharmacy. We also reduced your dose of metoprolol to 50mg  daily and your lasix was reduced to 40mg  only daily.   Please call your primary care doctor or Dr. office if you have any questions regarding this.   Keep your scheduled follow up with cardiology and schedule a follow up visit with your primary care doctor as well.

## 2021-03-11 ENCOUNTER — Telehealth: Payer: Self-pay

## 2021-03-11 NOTE — Telephone Encounter (Signed)
[facility] with [facility] requesting another doctor to send in Rx. States Dr. Montez Morita is not medicaid enrolled. Pt were seen in the hospital under our services. Please call the pharmacy back.

## 2021-03-11 NOTE — Telephone Encounter (Signed)
Transition Care Management Unsuccessful Follow-up Telephone Call  Date of discharge and from where:  [facility] on 03/10/2021  Attempts:  1st Attempt  Reason for unsuccessful TCM follow-up call:  Left voice message unable to reach pt at this time. Call back requested.

## 2021-03-11 NOTE — Telephone Encounter (Signed)
Call to [facility].  Dr. Montez Morita not under Medicaid .  Will need new prescriptions sent to [facility] that were sent by Dr. Montez Morita.

## 2021-03-12 ENCOUNTER — Telehealth: Payer: Self-pay

## 2021-03-12 ENCOUNTER — Other Ambulatory Visit (HOSPITAL_BASED_OUTPATIENT_CLINIC_OR_DEPARTMENT_OTHER): Payer: Self-pay | Admitting: *Deleted

## 2021-03-12 ENCOUNTER — Other Ambulatory Visit: Payer: Self-pay | Admitting: Internal Medicine

## 2021-03-12 ENCOUNTER — Telehealth: Payer: Self-pay | Admitting: Licensed Clinical Social Worker

## 2021-03-12 MED ORDER — METOPROLOL SUCCINATE ER 50 MG PO TB24: 50.0000 mg | ORAL_TABLET | Freq: Every day | ORAL | 1 refills | Status: DC

## 2021-03-12 MED ORDER — SPIRONOLACTONE 25 MG PO TABS: 12.5000 mg | ORAL_TABLET | Freq: Every day | ORAL | 0 refills | Status: DC

## 2021-03-12 MED ORDER — DIGOXIN 125 MCG PO TABS
0.1250 mg | ORAL_TABLET | Freq: Every day | ORAL | 0 refills | Status: DC
Start: 2021-03-12 — End: 2021-03-15

## 2021-03-12 MED ORDER — SACUBITRIL-VALSARTAN 24-26 MG PO TABS: 1.0000 | ORAL_TABLET | Freq: Two times a day (BID) | ORAL | 0 refills | Status: DC

## 2021-03-12 MED ORDER — DAPAGLIFLOZIN PROPANEDIOL 10 MG PO TABS
10.0000 mg | ORAL_TABLET | Freq: Every day | ORAL | 0 refills | Status: DC
Start: 2021-03-12 — End: 2021-03-15

## 2021-03-12 MED ORDER — FUROSEMIDE 40 MG PO TABS: ORAL_TABLET | ORAL | 0 refills | Status: DC

## 2021-03-12 NOTE — Telephone Encounter (Signed)
Can you do me a favor? looks like this pt was given refills when he left the hospital but there are two that were prescribed by a medical resident not in the medicaid system so they werent able to fill some. can we have his furosemide and metoprolol refills sent to [facility]?  Above message received from [facility], refilled as requested  Patient has appointment this week with Verdon Cummins NP

## 2021-03-12 NOTE — Telephone Encounter (Signed)
30-day prescription was sent for following medications were resent to [facility]: Sherryll Burger 24-25 twice daily, Farxiga 10mg  daily, Digoxin 0.125mg  daily, and Spironolactone 12.5mg  daily as listed on discharge summary.  He has follow up appointment with PCP on 3/8 for any further refills.

## 2021-03-12 NOTE — Telephone Encounter (Addendum)
Transition Care Management Follow-up Telephone Call Date of discharge and from where: 03/10/2021. [facility] How have you been since you were released from the hospital? He stated that he feels " a whole lot better." Any questions or concerns? Yes - he said that he does not have any of his medications.  He explained that he contacted [facility] and was told that they are not able to fills the prescriptions because the provider who ordered them is not registered with Medicaid.  The patient also stated that he has tried to contact Dr Blenda Mounts office for assistance but has not heard back from anyone in that office. Informed him that this CM will contact cardiology. I spoke to Christy Sartorius, [place] who said that they just received new prescriptions for farxiga, digoxin, entresto and spironolactone but they still needs new prescriptions for furosemide and metoprolol succinate. I contacted Westley Hummer, LCSW /cardiology and she was able to assist with contacting Dr Blenda Mounts office to have the prescriptions for furosemide and metoprolol succinate sent to [place]. I spoke to [place]. [place]  and he said they now have all 6 prescriptions and they will be ready this afternoon. He said that the patient has been contacted and is aware. I spoke to patient and he said he was contacted by [facility] and the medications will be delivered this afternoon;  Items Reviewed: Did the pt receive and understand the discharge instructions provided? Yes  Medications obtained and verified?  Issues with medications noted above.  Other? No  Any new allergies since your discharge? No  Dietary orders reviewed? Yes Do you have support at home?  Lives alone.   Home Care and Equipment/Supplies: Were home health services ordered? no If so, what is the name of the agency? N/a  Has the agency set up a time to come to the patient's home? not applicable Were any new equipment or medical supplies  ordered?  No What is the name of the medical supply agency? N/a Were you able to get the supplies/equipment? not applicable Do you have any questions related to the use of the equipment or supplies? No  He said he needs to obtain a scale.   Functional Questionnaire: (I = Independent and D = Dependent) ADLs: independent   Follow up appointments reviewed:  PCP Hospital f/u appt confirmed? Yes  Scheduled to see Dr Margarita Rana  - 03/27/2021 @ 1030.  [facility] f/u appt confirmed? Yes  Scheduled to see cardiology - 03/14/2021.  Are transportation arrangements needed? Yes  - provided him with the phone number for [place] # [phone] and urged him to call to schedule his transportation today.  If their condition worsens, is the pt aware to call PCP or go to the Emergency Dept.? Yes Was the patient provided with contact information for the PCP's office or ED? Yes Was to pt encouraged to call back with questions or concerns? Yes

## 2021-03-12 NOTE — Telephone Encounter (Signed)
Received message from Opal Sidles, [facility] which is pt PCP. During her hospital f/u call pt noted challenges in obtaining medications from pharmacy. Pt in need of two additional medications that were sent by inpatient MD that is not in Medicaid system. Appreciate assistance of Melinda, LPN w/ Dr. Oval Linsey who sent refill in for metoprolol and furosemide. Pt should be able to now pick up all medications. RNCM made aware. Remain available for any additional heart/vascular care coordination needs.    Westley Hummer, MSW, [place]  [phone]- work cell phone (preferred) [phone]- desk phone

## 2021-03-12 NOTE — Telephone Encounter (Signed)
From the discharge call:   He stated that he feels " a whole lot better."  He said that he does not have any of his medications.  He explained that he contacted [facility] and was told that they are not able to fills the prescriptions because the provider who ordered them is not registered with Medicaid.  The patient also stated that he has tried to contact Dr Leonides Sake office for assistance but has not heard back from anyone in that office. Informed him that this CM will contact cardiology.  I spoke to Alecia Lemming, RPH/[facility] who said that they just received new prescriptions for farxiga, digoxin, entresto and spironolactone but they still needs new prescriptions for furosemide and metoprolol succinate. I contacted Octavio Graves, LCSW /cardiology and she was able to assist with contacting Dr Leonides Sake office to have the prescriptions for furosemide and metoprolol succinate sent to [facility].  I spoke to [facility]. RPH  and he said they now have all 6 prescriptions and they will be ready this afternoon. He said that the patient has been contacted and is aware.  I spoke to patient and he said he was contacted by [facility] and the medications will be delivered this afternoon;  He said he needs to obtain a scale.   Scheduled to see Dr Alvis Lemmings  - 03/27/2021 @ 1030.
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
## 2021-03-14 ENCOUNTER — Ambulatory Visit (INDEPENDENT_AMBULATORY_CARE_PROVIDER_SITE_OTHER): Payer: Medicaid Other | Admitting: Nurse Practitioner

## 2021-03-14 ENCOUNTER — Other Ambulatory Visit: Payer: Self-pay

## 2021-03-14 ENCOUNTER — Encounter (HOSPITAL_BASED_OUTPATIENT_CLINIC_OR_DEPARTMENT_OTHER): Payer: Self-pay | Admitting: Nurse Practitioner

## 2021-03-14 VITALS — BP 122/78 | HR 70 | Ht 68.0 in | Wt 148.9 lb

## 2021-03-14 DIAGNOSIS — Z95810 Presence of automatic (implantable) cardiac defibrillator: Secondary | ICD-10-CM | POA: Diagnosis not present

## 2021-03-14 DIAGNOSIS — I5022 Chronic systolic (congestive) heart failure: Secondary | ICD-10-CM | POA: Diagnosis not present

## 2021-03-14 DIAGNOSIS — I5043 Acute on chronic combined systolic (congestive) and diastolic (congestive) heart failure: Secondary | ICD-10-CM | POA: Diagnosis not present

## 2021-03-14 DIAGNOSIS — I519 Heart disease, unspecified: Secondary | ICD-10-CM | POA: Diagnosis not present

## 2021-03-14 DIAGNOSIS — Z79899 Other long term (current) drug therapy: Secondary | ICD-10-CM

## 2021-03-14 DIAGNOSIS — F1010 Alcohol abuse, uncomplicated: Secondary | ICD-10-CM

## 2021-03-14 DIAGNOSIS — I428 Other cardiomyopathies: Secondary | ICD-10-CM

## 2021-03-14 MED ORDER — ENTRESTO 49-51 MG PO TABS: 1.0000 | ORAL_TABLET | Freq: Two times a day (BID) | ORAL | 3 refills | Status: DC

## 2021-03-14 NOTE — Patient Instructions (Signed)
Medication Instructions:  Your physician has recommended you make the following change in your medication:   Change: Entresto 49-51mg  twice daily. You may take two of your existing pills each time to use them up and then pick up your new prescription!   *If you need a refill on your cardiac medications before your next appointment, please call your pharmacy*   Lab Work: Your physician recommends that you return for lab work Today- BMET and Digoxin Level  Please return for Lab work in 1 week for a repeat BMET. You may come to the...   [facility] (3rd floor) [address] [place], [place] [address]  Open: 8am-Noon and 1pm-4:30pm   [place] at [place]- Any location  **no appointments needed**  If you have labs (blood work) drawn today and your tests are completely normal, you will receive your results only by: [employer] (if you have MyChart) OR A paper copy in the mail If you have any lab test that is abnormal or we need to change your treatment, we will call you to review the results.  Follow-Up: At [facility], you and your health needs are our priority.  As part of our continuing mission to provide you with exceptional heart care, we have created designated Provider Care Teams.  These Care Teams include your primary Cardiologist (physician) and Advanced Practice Providers (APPs -  Physician Assistants and Nurse Practitioners) who all work together to provide you with the care you need, when you need it.  We recommend signing up for the patient portal called "MyChart".  Sign up information is provided on this After Visit Summary.  MyChart is used to connect with patients for Virtual Visits (Telemedicine).  Patients are able to view lab/test results, encounter notes, upcoming appointments, etc.  Non-urgent messages can be sent to your provider as well.   To learn more about what you can do with  MyChart, go to [email or web address].    Your next appointment:   1 month(s)  The format for your next appointment:   In Person  Provider:   Skeet Latch, MD or Laurann Montana, NP{  Other Instructions We have placed a referral for you to the heart failure clinic. They will reach out to you to get this scheduled!

## 2021-03-15 ENCOUNTER — Other Ambulatory Visit: Payer: Self-pay | Admitting: Nurse Practitioner

## 2021-03-15 ENCOUNTER — Telehealth (HOSPITAL_BASED_OUTPATIENT_CLINIC_OR_DEPARTMENT_OTHER): Payer: Self-pay

## 2021-03-15 DIAGNOSIS — I5043 Acute on chronic combined systolic (congestive) and diastolic (congestive) heart failure: Secondary | ICD-10-CM

## 2021-03-15 LAB — BASIC METABOLIC PANEL
BUN/Creatinine Ratio: 18 (ref 9–20)
BUN: 20 mg/dL (ref 6–24)
CO2: 21 mmol/L (ref 20–29)
Calcium: 9.7 mg/dL (ref 8.7–10.2)
Chloride: 103 mmol/L (ref 96–106)
Creatinine, Ser: 1.14 mg/dL (ref 0.76–1.27)
Glucose: 74 mg/dL (ref 70–99)
Potassium: 4.4 mmol/L (ref 3.5–5.2)
Sodium: 138 mmol/L (ref 134–144)
eGFR: 76 mL/min/{1.73_m2} (ref >59)

## 2021-03-15 LAB — DIGOXIN LEVEL: Digoxin, Serum: 0.4 ng/mL — ABNORMAL LOW (ref 0.5–0.9)

## 2021-03-15 MED ORDER — DAPAGLIFLOZIN PROPANEDIOL 10 MG PO TABS: 10.0000 mg | ORAL_TABLET | Freq: Every day | ORAL | 11 refills | Status: AC

## 2021-03-15 MED ORDER — DIGOXIN 125 MCG PO TABS: 0.1250 mg | ORAL_TABLET | Freq: Every day | ORAL | 11 refills | Status: DC

## 2021-03-15 MED ORDER — SPIRONOLACTONE 25 MG PO TABS: 12.5000 mg | ORAL_TABLET | Freq: Every day | ORAL | 11 refills | Status: DC

## 2021-03-15 MED ORDER — FUROSEMIDE 40 MG PO TABS: ORAL_TABLET | ORAL | 11 refills | Status: DC

## 2021-03-15 MED ORDER — SACUBITRIL-VALSARTAN 24-26 MG PO TABS: 1.0000 | ORAL_TABLET | Freq: Two times a day (BID) | ORAL | 11 refills | Status: DC

## 2021-03-15 MED ORDER — METOPROLOL SUCCINATE ER 50 MG PO TB24: 50.0000 mg | ORAL_TABLET | Freq: Every day | ORAL | 11 refills | Status: DC

## 2021-03-15 NOTE — Addendum Note (Signed)
Addended by: Levi Aland on: 03/15/2021 07:24 AM   Modules accepted: Orders

## 2021-03-15 NOTE — Telephone Encounter (Deleted)
-----   Message from Levi Aland, NP sent at 03/15/2021  8:24 AM EST ----- Will continue digoxin at current dose and recheck at next office visit.  Creatinine is elevated. I advised him to increase Entresto at office visit yesterday. Please ask him to take the starting dose Entresto 24-26 mg twice daily (I wrote "double") on his bottle for him but we will not double at this time. I have updated the prescription. Will still need repeat bmet in one week.

## 2021-03-15 NOTE — Telephone Encounter (Addendum)
Results called to patient who verbalizes understanding!     Labs ordered and mailed to patient!   ----- Message from Emmaline Life, NP sent at 03/15/2021  8:24 AM EST ----- Will continue digoxin at current dose and recheck at next office visit.    Creatinine is elevated. I advised him to increase Entresto at office visit yesterday. Please ask him to take the starting dose Entresto 24-26 mg twice daily (I wrote "double") on his bottle for him but we will not double at this time. I have updated the prescription. Will still need repeat bmet in one week.

## 2021-03-20 ENCOUNTER — Telehealth (HOSPITAL_BASED_OUTPATIENT_CLINIC_OR_DEPARTMENT_OTHER): Payer: Self-pay | Admitting: Cardiovascular Disease

## 2021-03-20 ENCOUNTER — Telehealth: Payer: Self-pay

## 2021-03-20 NOTE — Telephone Encounter (Signed)
**Note De-Identified  Obfuscation** Nevin Bloodgood Key: [id number] - PA Case ID: [id number] - Rx #: [id number] ?Outcome: Approved today ?Coverage Starts on: 03/20/2021 12:00:00 AM, Coverage Ends on: 03/20/2022 12:00:00 AM. ?Drug: Wilder Glade 10MG  tablets ?Form: [employer] [place] Form [phone] NCPDP) ? ?I have notifiedCARLOS Teel Key: [id number] - PA Case ID: [id number] - Rx #: [id number] Need help? Call us at [phone] ?Outcome ?Approvedtoday ?PA Case: [id number], Status: Approved, Coverage Starts on: 03/20/2021 12:00:00 AM, Coverage Ends on: 03/20/2022 12:00:00 AM. ?Drug ?Farxiga 10MG  tablets ?Form ?[employer] [place] Form [phone] NCPDP) ? ? ?

## 2021-03-20 NOTE — Telephone Encounter (Signed)
**Note De-Identified  Obfuscation** When I called [facility] - [place], [place] - [address] (Ph: [phone]) to advise them of this approval the pharmacist stated that he has called the office to get clarification on the correct Entresto dose as we sent them a Entresto 49-51 mg RX on 2/23 and then Entresro 24-26 mg on 2/24. ? ?Per office visit with Eligha Bridegroom, NP on 2/23: ?Will get bmet and digoxin level today. Will increase Entresto to 49-51 mg twice daily and plan for repeat bmet in 1 week.  ? ?On 2/24 due to Lab results: ?Please ask him to take the starting dose Entresto 24-26 mg twice daily (I wrote "double") on his bottle for him but we will not double at this time. ? ?Forwarding this message to Eligha Bridegroom, NP and her nurse for clarification to [facility] at [phone]. ?

## 2021-03-20 NOTE — Telephone Encounter (Signed)
RN called [facility] to clarify prescription so that it can be filled for the patient!  ?

## 2021-03-20 NOTE — Telephone Encounter (Signed)
**Note De-Identified  Obfuscation** Started a [place] PA through [employer]. ?Key: [id number] ?

## 2021-03-20 NOTE — Telephone Encounter (Signed)
Received fax from [facility] stating pt has been approved for Farxiga 10 mg (quantity of 30 tablets) from 03/20/21-03/20/22. ?

## 2021-03-27 ENCOUNTER — Ambulatory Visit: Payer: Medicaid Other | Attending: Family Medicine | Admitting: Family Medicine

## 2021-03-27 ENCOUNTER — Encounter: Payer: Self-pay | Admitting: Family Medicine

## 2021-03-27 ENCOUNTER — Other Ambulatory Visit: Payer: Self-pay

## 2021-03-27 VITALS — BP 116/77 | HR 77 | Ht 68.0 in | Wt 145.6 lb

## 2021-03-27 DIAGNOSIS — Z1211 Encounter for screening for malignant neoplasm of colon: Secondary | ICD-10-CM

## 2021-03-27 DIAGNOSIS — F17210 Nicotine dependence, cigarettes, uncomplicated: Secondary | ICD-10-CM

## 2021-03-27 DIAGNOSIS — I5022 Chronic systolic (congestive) heart failure: Secondary | ICD-10-CM

## 2021-03-27 DIAGNOSIS — G4709 Other insomnia: Secondary | ICD-10-CM

## 2021-03-27 DIAGNOSIS — J438 Other emphysema: Secondary | ICD-10-CM | POA: Diagnosis not present

## 2021-03-27 DIAGNOSIS — R053 Chronic cough: Secondary | ICD-10-CM

## 2021-03-27 DIAGNOSIS — I110 Hypertensive heart disease with heart failure: Secondary | ICD-10-CM | POA: Diagnosis not present

## 2021-03-27 MED ORDER — FLUTICASONE-SALMETEROL 250-50 MCG/ACT IN AEPB: 1.0000 | INHALATION_SPRAY | Freq: Two times a day (BID) | RESPIRATORY_TRACT | 3 refills | Status: DC

## 2021-03-27 MED ORDER — TRAZODONE HCL 50 MG PO TABS: 50.0000 mg | ORAL_TABLET | Freq: Every evening | ORAL | 3 refills | Status: DC | PRN

## 2021-03-27 MED ORDER — SPIRIVA HANDIHALER 18 MCG IN CAPS: 18.0000 ug | ORAL_CAPSULE | Freq: Every day | RESPIRATORY_TRACT | 6 refills | Status: DC
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
## 2021-03-27 NOTE — Patient Instructions (Signed)

## 2021-04-12 ENCOUNTER — Ambulatory Visit (HOSPITAL_BASED_OUTPATIENT_CLINIC_OR_DEPARTMENT_OTHER): Payer: Medicaid Other | Admitting: Family

## 2021-05-13 ENCOUNTER — Ambulatory Visit (INDEPENDENT_AMBULATORY_CARE_PROVIDER_SITE_OTHER): Payer: Medicaid Other

## 2021-05-13 DIAGNOSIS — I428 Other cardiomyopathies: Secondary | ICD-10-CM

## 2021-05-14 LAB — CUP PACEART REMOTE DEVICE CHECK
Battery Remaining Longevity: 71 mo
Battery Remaining Percentage: 71 %
Battery Voltage: 2.98 V
Brady Statistic RV Percent Paced: 1 %
Date Time Interrogation Session: 20230424020016
HighPow Impedance: 64 Ohm
HighPow Impedance: 64 Ohm
Implantable Lead Implant Date: 20191230
Implantable Lead Implant Date: 20191230
Implantable Lead Location: 753860
Implantable Lead Location: 753860
Implantable Lead Model: 7122
Implantable Lead Model: 7122
Implantable Pulse Generator Implant Date: 20191230
Lead Channel Impedance Value: 450 Ohm
Lead Channel Pacing Threshold Amplitude: 0.5 V
Lead Channel Pacing Threshold Pulse Width: 0.5 ms
Lead Channel Sensing Intrinsic Amplitude: 11.6 mV
Lead Channel Setting Pacing Amplitude: 2.5 V
Lead Channel Setting Pacing Pulse Width: 0.5 ms
Lead Channel Setting Sensing Sensitivity: 0.5 mV
Pulse Gen Serial Number: 9850980

## 2021-05-16 ENCOUNTER — Other Ambulatory Visit: Payer: Self-pay | Admitting: *Deleted

## 2021-05-16 ENCOUNTER — Encounter: Payer: Self-pay | Admitting: Gastroenterology

## 2021-05-16 NOTE — Patient Outreach (Signed)
?Medicaid Managed Care   ?Nurse Care Manager Note ? ?05/16/2021 ?Name:  [name] MRN:  [id number] DOB:  January 13, 1966 ? ?[name] is an 56 y.o. year old male who is a primary patient of Cole Rakes, MD.  The [facility] Managed Care Coordination team was consulted for assistance with:    ?CHF ? ?[name] was given information about Medicaid Managed Care Coordination team services today. [name] Patient agreed to services and verbal consent obtained. ? ?Engaged with patient by telephone for initial visit in response to provider referral for case management and/or care coordination services.  ? ?Assessments/Interventions:  Review of past medical history, allergies, medications, health status, including review of consultants reports, laboratory and other test data, was performed as part of comprehensive evaluation and provision of chronic care management services. ? ?SDOH (Social Determinants of Health) assessments and interventions performed: ?SDOH Interventions   ? ?Flowsheet Row Most Recent Value  ?SDOH Interventions   ?Food Insecurity Interventions Other (Comment)  [Care Guide referral for list of local food pantries]  ?Housing Interventions Intervention Not Indicated  ?Transportation Interventions Other (Comment)  [Provided with HB transportation]  ? ?  ? ? ?Care Plan ? ?Allergies  ?Allergen Reactions  ? Penicillins Other (See Comments)  ?  Blisters  ?Has patient had a PCN reaction causing immediate rash, facial/tongue/throat swelling, SOB or lightheadedness with hypotension: Yes ?Has patient had a PCN reaction causing severe rash involving mucus membranes or skin necrosis: Yes ?Has patient had a PCN reaction that required hospitalization: No ?Has patient had a PCN reaction occurring within the last 10 years: No ?If all of the above answers are "NO", then may proceed with Cephalosporin use. ?  ? ? ?Medications Reviewed Today   ? ? Reviewed by [name], RN (Registered Nurse) on  05/16/21 at [place] List Status: <None>  ? ?Medication Order Taking? Sig Documenting Provider Last Dose Status Informant  ?albuterol (PROAIR HFA) 108 (90 Base) MCG/ACT inhaler [id number] Yes Inhale 2 puffs into the lungs every 6 (six) hours as needed for shortness of breath.  ?Patient taking differently: Inhale 2-3 puffs into the lungs See admin instructions. Inhale 2-3 puffs into the lungs five to six times a day as needed for shortness of breath or wheezing  ? Cole Rakes, MD Taking Active Self  ?aspirin 81 MG EC tablet [id number] Yes Take 81 mg by mouth daily. Swallow whole. [provider] Taking Active Self  ?cetirizine (ZYRTEC) 10 MG tablet [id number] Yes TAKE 1 TABLET (10 MG TOTAL) BY MOUTH DAILY.  ?Patient taking differently: Take 10 mg by mouth daily as needed for allergies or rhinitis.  ? Cole Rakes, MD Taking Active Self  ?digoxin (LANOXIN) 0.125 MG tablet [id number] Yes Take 1 tablet (0.125 mg total) by mouth daily. [name], [name], NP Taking Active   ?fluticasone-salmeterol (ADVAIR DISKUS) 250-50 MCG/ACT AEPB [id number] Yes Inhale 1 puff into the lungs in the morning and at bedtime. Cole Rakes, MD Taking Active   ?furosemide (LASIX) 40 MG tablet [id number] Yes TAKE 1 TABLET daily [name], [name], NP Taking Active   ?metoprolol succinate (TOPROL-XL) 50 MG 24 hr tablet [id number] Yes Take 1 tablet (50 mg total) by mouth daily. Take with or immediately following a meal. [name], [name], NP Taking Active   ?sacubitril-valsartan (ENTRESTO) 24-26 MG [id number] Yes Take 1 tablet by mouth 2 (two) times daily. [name], [name], NP Taking Active   ?spironolactone (ALDACTONE) 25 MG tablet [id number] Yes Take 0.5  tablets (12.5 mg total) by mouth daily. [name], [name], NP Taking Active   ?tiotropium (SPIRIVA HANDIHALER) 18 MCG inhalation capsule [id number] Yes Place 1 capsule (18 mcg total) into inhaler and inhale daily. Cole Rakes, MD Taking Active   ?traZODone (DESYREL) 50  MG tablet [id number] Yes Take 1 tablet (50 mg total) by mouth at bedtime as needed for sleep. Cole Rakes, MD Taking Active   ? ?  ?  ? ?  ? ? ?Patient Active Problem List  ? Diagnosis Date Noted  ? Acute combined systolic and diastolic congestive heart failure ([place])   ? AKI (acute kidney injury) ([place])   ? Acute exacerbation of CHF (congestive heart failure) ([place]) 03/05/2021  ? SVT (supraventricular tachycardia) ([place]) 06/15/2020  ? Slurred speech 07/05/2019  ? ICD (implantable cardioverter-defibrillator) in place 07/20/2018  ? COPD (chronic obstructive pulmonary disease) ([place]) 12/09/2017  ? Obstructive sleep apnea 09/08/2016  ? Autonomic dysfunction 07/15/2015  ? DJD (degenerative joint disease), lumbar 06/19/2015  ? Tremor 05/14/2015  ? Abnormality of gait 05/14/2015  ? Neurogenic bladder 04/17/2015  ? Lumbar strain 03/23/2015  ? Leg weakness 03/23/2015  ? Major depression, chronic   ? History of stroke 02/26/2015  ? Benign essential HTN   ? ETOH abuse   ? Tobacco abuse   ? Chronic combined systolic and diastolic heart failure ([place])   ? 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 Smoking greater than 20 pack years 04/19/2014  ? Polysubstance abuse ([place]) 03/22/2014  ? ? ?Conditions to be addressed/monitored per PCP order:  CHF ? ?Care Plan : RN Care Manager Plan of Care  ?Updates made by [name], RN since 05/16/2021 12:00 AM  ?  ? ?Problem: Health Management needs related to CHF   ?  ? ?Long-Range Goal: Development of Plan of Care to address Health Management needs related to CHF   ?Start Date: 05/16/2021  ?Expected End Date: 08/14/2021  ?Priority: High  ?Note:   ?Current Barriers:  ?Chronic Disease Management support and education needs related to CHF Mr. Marsili reports difficulty with transportation getting to appointments and to the pharmacy to pick up medications. He missed appointment with Cardiology in 04/13/22 due to a death in the family. ? ?RNCM Clinical Goal(s):  ?Patient will verbalize  understanding of plan for management of CHF as evidenced by patient reports ?take all medications exactly as prescribed and will call provider for medication related questions as evidenced by patient reports and documentation in EMR    ?attend all scheduled medical appointments: PCP on 07/02/21, reschedule missed Cardiology appointment and schedule colon cancer screen as evidenced by provider documentation in EMR        ?work with [employer] care guide to address needs related to Limited access to food and Level of care concerns as evidenced by patient and/or community resource care guide support    through collaboration with Consulting civil engineer, provider, and care team.  ? ?Interventions: ?Inter-disciplinary care team collaboration (see longitudinal plan of care) ?Evaluation of current treatment plan related to  self management and patient's adherence to plan as established by provider ?Provided the information to [facility] [phone] and  ?Provided patient with [facility] [phone], call for colon cancer screen/follow up on referral ?Collaborated with [facility] for needed refills, medications to be delivered today between 1-5p ? ? ?Heart Failure Interventions:  (Status: New goal.)  Long Term Goal  ?Wt Readings from Last 3 Encounters:  ?03/27/21 145 lb 9.6 oz (66 kg)  ?03/14/21  148 lb 14.4 oz (67.5 kg)  ?03/10/21 147 lb 11.3 oz (67 kg)  ? ?Basic overview and discussion of pathophysiology of Heart Failure reviewed ?Provided education on low sodium diet ?Reviewed role of diuretics in prevention of fluid overload and management of heart failure ?Discussed the importance of keeping all appointments with provider ?Referral made to community resources care guide team for assistance with food pantries and dental providers ?Advised patient to discuss any concerns or questions with provider ?Assessed social determinant of health barriers ?Message sent to Cardiology office for rescheduling of missed  appointment ? ?Patient Goals/Self-Care Activities: ?Take medications as prescribed   ?Attend all scheduled provider appointments ?Call pharmacy for medication refills 3-7 days in advance of running out of medications ?Call pr

## 2021-05-16 NOTE — Patient Instructions (Signed)
Visit Information ? ?[name] was given information about Medicaid Managed Care team care coordination services as a part of their Healthy [facility] Medicaid benefit. [name] verbally consented to engagement with the [facility] Managed Care team.  ? ?If you are experiencing a medical emergency, please call 911 or report to your local emergency department or urgent care.  ? ?If you have a non-emergency medical problem during routine business hours, please contact your provider's office and ask to speak with a nurse.  ? ?For questions related to your Healthy [facility] health plan, please call: [phone] or visit the homepage here: [email or web address] ? ?If you would like to schedule transportation through your Healthy [facility] plan, please call the following number at least 2 days in advance of your appointment: [phone] ? For information about your ride after you set it up, call Ride Assist at [phone]. Use this number to activate a Will Call pickup, or if your transportation is late for a scheduled pickup. Use this number, too, if you need to make a change or cancel a previously scheduled reservation. ? If you need transportation services right away, call [phone]. The after-hours call center is staffed 24 hours to handle ride assistance and urgent reservation requests (including discharges) 365 days a year. Urgent trips include sick visits, hospital discharge requests and life-sustaining treatment. ? ?Call the [facility] Line at [phone], at any time, 24 hours a day, 7 days a week. If you are in danger or need immediate medical attention call 911. ? ?If you would like help to quit smoking, call 1-800-QUIT-NOW ((251) 534-1018) OR Espa?ol: 1-855-D?jelo-Ya (928) 867-6509) o para m?s informaci?n haga clic aqu? or Text READY to 200-400 to register via text ? ?[name], ? ? ?Please see education materials related to CHF provided as  print materials.  ? ?The patient verbalized understanding of instructions, educational materials, and care plan provided today and agreed to receive a mailed copy of patient instructions, educational materials, and care plan.  ? ?Telephone follow up appointment with Managed Medicaid care management team member scheduled for:05/30/21 @ 9am ? ?[name] RN, BSN ?[place]  [facility] ?RN Care Coordinator ? ? ?Following is a copy of your plan of care:  ?Care Plan : RN Care Manager Plan of Care  ?Updates made by Heidi Dach, RN since 05/16/2021 12:00 AM  ?  ? ?Problem: Health Management needs related to CHF   ?  ? ?Long-Range Goal: Development of Plan of Care to address Health Management needs related to CHF   ?Start Date: 05/16/2021  ?Expected End Date: 08/14/2021  ?Priority: High  ?Note:   ?Current Barriers:  ?Chronic Disease Management support and education needs related to CHF [name] reports difficulty with transportation getting to appointments and to the pharmacy to pick up medications. He missed appointment with Cardiology in 04/14/2022 due to a death in the family. ? ?RNCM Clinical Goal(s):  ?Patient will verbalize understanding of plan for management of CHF as evidenced by patient reports ?take all medications exactly as prescribed and will call provider for medication related questions as evidenced by patient reports and documentation in EMR    ?attend all scheduled medical appointments: PCP on 07/02/21, reschedule missed Cardiology appointment and schedule colon cancer screen as evidenced by provider documentation in EMR        ?work with [employer] care guide to address needs related to Limited access to food and Level of care concerns as evidenced by patient and/or community  resource care guide support    through collaboration with Medical illustrator, provider, and care team.  ? ?Interventions: ?Inter-disciplinary care team collaboration (see longitudinal plan of care) ?Evaluation of  current treatment plan related to  self management and patient's adherence to plan as established by provider ?Provided the information to [facility] [phone] and  ?Provided patient with [facility] [phone], call for colon cancer screen/follow up on referral ?Collaborated with [facility] for needed refills, medications to be delivered today between 1-5p ? ? ?Heart Failure Interventions:  (Status: New goal.)  Long Term Goal  ?Wt Readings from Last 3 Encounters:  ?03/27/21 145 lb 9.6 oz (66 kg)  ?03/14/21 148 lb 14.4 oz (67.5 kg)  ?03/10/21 147 lb 11.3 oz (67 kg)  ? ?Basic overview and discussion of pathophysiology of Heart Failure reviewed ?Provided education on low sodium diet ?Reviewed role of diuretics in prevention of fluid overload and management of heart failure ?Discussed the importance of keeping all appointments with provider ?Referral made to community resources care guide team for assistance with food pantries and dental providers ?Advised patient to discuss any concerns or questions with provider ?Assessed social determinant of health barriers ?Message sent to Cardiology office for rescheduling of missed appointment ? ?Patient Goals/Self-Care Activities: ?Take medications as prescribed   ?Attend all scheduled provider appointments ?Call pharmacy for medication refills 3-7 days in advance of running out of medications ?Call provider office for new concerns or questions  ?use salt in moderation ?watch for swelling in feet, ankles and legs every day ?eat more whole grains, fruits and vegetables, lean meats and healthy fats ? ? ?  ? ?  ?

## 2021-05-28 NOTE — Progress Notes (Signed)
Remote ICD transmission.   

## 2021-05-30 ENCOUNTER — Other Ambulatory Visit: Payer: Self-pay | Admitting: *Deleted

## 2021-05-30 ENCOUNTER — Telehealth: Payer: Self-pay | Admitting: *Deleted

## 2021-05-30 DIAGNOSIS — Z5941 Food insecurity: Secondary | ICD-10-CM

## 2021-05-30 NOTE — Patient Instructions (Addendum)
Visit Information ? ?[name] was given information about Medicaid Managed Care team care coordination services as a part of their Healthy [facility] Medicaid benefit. [name] verbally consented to engagement with the [facility] Managed Care team.  ? ?If you are experiencing a medical emergency, please call 911 or report to your local emergency department or urgent care.  ? ?If you have a non-emergency medical problem during routine business hours, please contact your provider's office and ask to speak with a nurse.  ? ?For questions related to your Healthy [facility] health plan, please call: [phone] or visit the homepage here: [email or web address] ? ?If you would like to schedule transportation through your Healthy [facility] plan, please call the following number at least 2 days in advance of your appointment: [phone] ? For information about your ride after you set it up, call Ride Assist at [phone]. Use this number to activate a Will Call pickup, or if your transportation is late for a scheduled pickup. Use this number, too, if you need to make a change or cancel a previously scheduled reservation. ? If you need transportation services right away, call [phone]. The after-hours call center is staffed 24 hours to handle ride assistance and urgent reservation requests (including discharges) 365 days a year. Urgent trips include sick visits, hospital discharge requests and life-sustaining treatment. ? ?Call the [facility] Line at [phone], at any time, 24 hours a day, 7 days a week. If you are in danger or need immediate medical attention call 911. ? ?If you would like help to quit smoking, call 1-800-QUIT-NOW (763-822-1599) OR Espa?ol: 1-855-D?jelo-Ya 207-340-0113) o para m?s informaci?n haga clic aqu? or Text READY to 200-400 to register via text ? ?[name], ? ? ?Please see education materials related to CHF provided as  print materials.  ? ?The patient verbalized understanding of instructions, educational materials, and care plan provided today and agreed to receive a mailed copy of patient instructions, educational materials, and care plan.  ? ?Telephone follow up appointment with Managed Medicaid care management team member scheduled for:07/04/21 @ 10:30am ? ?Cole Emms RN, BSN ?[place]  [facility] ?RN Care Coordinator ? ? ?Following is a copy of your plan of care:  ?Care Plan : RN Care Manager Plan of Care  ?Updates made by Cole Dach, RN since 05/30/2021 12:00 AM  ?  ? ?Problem: Health Management needs related to CHF   ?  ? ?Long-Range Goal: Development of Plan of Care to address Health Management needs related to CHF   ?Start Date: 05/16/2021  ?Expected End Date: 08/14/2021  ?Priority: High  ?Note:   ?Current Barriers:  ?Chronic Disease Management support and education needs related to CHF [name] scheduled appointments with GI on 06/14/21 and Cardiology on 06/18/21. He will need to reschedule the GI appointment due to being out of town. He will call to arrange transportation once he reschedules the appointment. He has all of his medications and takes them as prescribed. He has not received a scale. ? ?RNCM Clinical Goal(s):  ?Patient will verbalize understanding of plan for management of CHF as evidenced by patient reports ?take all medications exactly as prescribed and will call provider for medication related questions as evidenced by patient reports and documentation in EMR    ?attend all scheduled medical appointments: PCP on 07/02/21, Cardiology 06/18/21 and GI on 06/14/21-needs to reschedule as evidenced by provider documentation in EMR        ?work with [employer]  care guide to address needs related to Limited access to food and Level of care concerns as evidenced by patient and/or community resource care guide support    through collaboration with Medical illustrator, provider, and care team.   ? ?Interventions: ?Inter-disciplinary care team collaboration (see longitudinal plan of care) ?Evaluation of current treatment plan related to  self management and patient's adherence to plan as established by provider ?Provided the information to [facility] [phone] and  ?Provided patient with [facility] [phone], call to reschedule ?Advised patient to call [employer] [phone] member services to request a new card and inquire about member benefits(fresh fruits and vegetables and scale) ? ? ? ?Heart Failure Interventions:  (Status: Goal on Track (progressing): YES.)  Long Term Goal  ?Wt Readings from Last 3 Encounters:  ?03/27/21 145 lb 9.6 oz (66 kg)  ?03/14/21 148 lb 14.4 oz (67.5 kg)  ?03/10/21 147 lb 11.3 oz (67 kg)  ? ?Reviewed Heart Failure Action Plan in depth and provided written copy ?Discussed importance of daily weight and advised patient to weigh and record daily ?Reviewed role of diuretics in prevention of fluid overload and management of heart failure ?Discussed the importance of keeping all appointments with provider ?Referral made to community resources care guide team for assistance with food pantries and dental providers ?Advised patient to discuss any concerns or questions with provider ?Assessed social determinant of health barriers ?Advised patient to take all medications to provider appointments ? ?Patient Goals/Self-Care Activities: ?Take medications as prescribed   ?Attend all scheduled provider appointments ?Call pharmacy for medication refills 3-7 days in advance of running out of medications ?Call provider office for new concerns or questions  ?use salt in moderation ?watch for swelling in feet, ankles and legs every day ?eat more whole grains, fruits and vegetables, lean meats and healthy fats ? ? ?  ? ?  ?

## 2021-05-30 NOTE — Telephone Encounter (Signed)
? ?  Telephone encounter was:  Successful.  ?05/30/2021 ?Name: [name] MRN: [id number] DOB: [birth year] ? ?[name] is a 56 y.o. year old male who is a primary care patient of Hoy Register, MD . The community resource team was consulted for assistance with Food Insecurity ? ?Care guide performed the following interventions: Patient provided with information about care guide support team and interviewed to confirm resource needs. ? ?Follow Up Plan:  Care guide will follow up with patient by phone over the next days ?[name] ?Care Guide , Embedded Care Coordination ?Rafael Gonzalez, Care Management  ?[phone] ?[address] [place] , [place] [address] ?Email : [email or web address] ?  ?

## 2021-05-30 NOTE — Patient Outreach (Addendum)
Medicaid Managed Care   Nurse Care Manager Note  05/30/2021 Name:  [name] MRN:  [id number] DOB:  [birth year]  [name] is an 56 y.o. year old male who is a primary patient of Cole Register, MD.  The [facility] Managed Care Coordination team was consulted for assistance with:    CHF  [name] was given information about Medicaid Managed Care Coordination team services today. [name] Patient agreed to services and verbal consent obtained.  Engaged with patient by telephone for follow up visit in response to provider referral for case management and/or care coordination services.   Assessments/Interventions:  Review of past medical history, allergies, medications, health status, including review of consultants reports, laboratory and other test data, was performed as part of comprehensive evaluation and provision of chronic care management services.  SDOH (Social Determinants of Health) assessments and interventions performed:   Care Plan  Allergies  Allergen Reactions   Penicillins Other (See Comments)    Blisters  Has patient had a PCN reaction causing immediate rash, facial/tongue/throat swelling, SOB or lightheadedness with hypotension: Yes Has patient had a PCN reaction causing severe rash involving mucus membranes or skin necrosis: Yes Has patient had a PCN reaction that required hospitalization: No Has patient had a PCN reaction occurring within the last 10 years: No If all of the above answers are "NO", then may proceed with Cephalosporin use.     Medications Reviewed Today     Reviewed by Cole Dach, RN (Registered Nurse) on 05/30/21 at 1016  Med List Status: <None>   Medication Order Taking? Sig Documenting Provider Last Dose Status Informant  albuterol (PROAIR HFA) 108 (90 Base) MCG/ACT inhaler [id number] Yes Inhale 2 puffs into the lungs every 6 (six) hours as needed for shortness of breath.  Patient taking differently: Inhale 2-3 puffs  into the lungs See admin instructions. Inhale 2-3 puffs into the lungs five to six times a day as needed for shortness of breath or wheezing   Cole Register, MD Taking Active Self  aspirin 81 MG EC tablet [id number] Yes Take 81 mg by mouth daily. Swallow whole. [provider] Taking Active Self  cetirizine (ZYRTEC) 10 MG tablet [id number] Yes TAKE 1 TABLET (10 MG TOTAL) BY MOUTH DAILY.  Patient taking differently: Take 10 mg by mouth daily as needed for allergies or rhinitis.   Cole Register, MD Taking Active Self  digoxin (LANOXIN) 0.125 MG tablet [id number] Yes Take 1 tablet (0.125 mg total) by mouth daily. Wells, Cole George, NP Taking Active   fluticasone-salmeterol (ADVAIR DISKUS) 250-50 MCG/ACT AEPB [id number] Yes Inhale 1 puff into the lungs in the morning and at bedtime. Cole Register, MD Taking Active   furosemide (LASIX) 40 MG tablet [id number] Yes TAKE 1 TABLET daily Wells, Cole George, NP Taking Active   metoprolol succinate (TOPROL-XL) 50 MG 24 hr tablet [id number] Yes Take 1 tablet (50 mg total) by mouth daily. Take with or immediately following a meal. Wells, Cole George, NP Taking Active   sacubitril-valsartan (ENTRESTO) 24-26 MG [id number] Yes Take 1 tablet by mouth 2 (two) times daily. Wells, Cole George, NP Taking Active   spironolactone (ALDACTONE) 25 MG tablet [id number] Yes Take 0.5 tablets (12.5 mg total) by mouth daily. Wells, Cole George, NP Taking Active   tiotropium (SPIRIVA HANDIHALER) 18 MCG inhalation capsule [id number] Yes Place 1 capsule (18 mcg total) into inhaler and inhale daily. Cole Register, MD Taking Active   traZODone (DESYREL) 50 MG tablet [id number] Yes  Take 1 tablet (50 mg total) by mouth at bedtime as needed for sleep. 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   COPD (chronic obstructive pulmonary disease) (HCC) 12/09/2017   Obstructive sleep apnea 09/08/2016   Autonomic dysfunction 07/15/2015   DJD (degenerative joint disease), lumbar 06/19/2015   Tremor 05/14/2015   Abnormality of gait 05/14/2015   Neurogenic bladder 04/17/2015   Lumbar strain 03/23/2015   Leg weakness 03/23/2015   Major depression, chronic    History of stroke 02/26/2015   Benign essential HTN    ETOH abuse    Tobacco abuse    Chronic combined systolic and diastolic heart failure (HCC)    Alcoholic cardiomyopathy (HCC) 29/52/8413   Smoking greater than 20 pack years 04/19/2014   Polysubstance abuse (HCC) 03/22/2014    Conditions to be addressed/monitored per PCP order:  CHF  Care Plan : RN Care Manager Plan of Care  Updates made by Cole Dach, RN since 05/30/2021 12:00 AM     Problem: Health Management needs related to CHF      Long-Range Goal: Development of Plan of Care to address Health Management needs related to CHF   Start Date: 05/16/2021  Expected End Date: 08/14/2021  Priority: High  Note:   Current Barriers:  Chronic Disease Management support and education needs related to CHF [name] scheduled appointments with GI on 06/14/21 and Cardiology on 06/18/21. He will need to reschedule the GI appointment due to being out of town. He will call to arrange transportation once he reschedules the appointment. He has all of his medications and takes them as prescribed. He has not received a scale.  RNCM Clinical Goal(s):  Patient will verbalize understanding of plan for management of CHF as evidenced by patient reports take all medications exactly as prescribed and will call provider for medication related questions as evidenced by patient reports  and documentation in EMR    attend all scheduled medical appointments: PCP on 07/02/21, Cardiology 06/18/21 and GI on 06/14/21-needs to reschedule as evidenced by provider documentation in EMR        work with community resource care guide to address needs related to Limited access to food and Level of care concerns as evidenced by patient and/or community resource care guide support    through collaboration with Medical illustrator, provider, and care team.   Interventions: Inter-disciplinary care team collaboration (see longitudinal plan of care) Evaluation of current treatment plan related to  self management and patient's adherence to plan as established by provider Provided the information to [facility] [phone] and  Provided patient with [facility] [phone], call to reschedule Advised patient to call [employer] [phone] member services to request a new card and inquire about member benefits(fresh fruits and vegetables and scale)    Heart Failure Interventions:  (Status: Goal on Track (progressing): YES.)  Long Term Goal  Wt Readings from Last 3 Encounters:  03/27/21 145 lb 9.6 oz (66 kg)  03/14/21 148 lb 14.4 oz (67.5 kg)  03/10/21 147 lb 11.3 oz (67 kg)   Reviewed Heart  Failure Action Plan in depth and provided written copy Discussed importance of daily weight and advised patient to weigh and record daily Reviewed role of diuretics in prevention of fluid overload and management of heart failure Discussed the importance of keeping all appointments with provider Referral made to community resources care guide team for assistance with food pantries and dental providers Advised patient to discuss any concerns or questions with provider Assessed social determinant of health barriers Advised patient to take all medications to provider appointments  Patient Goals/Self-Care Activities: Take medications as prescribed   Attend all scheduled provider appointments Call pharmacy  for medication refills 3-7 days in advance of running out of medications Call provider office for new concerns or questions  use salt in moderation watch for swelling in feet, ankles and legs every day eat more whole grains, fruits and vegetables, lean meats and healthy fats       Follow Up:  Patient agrees to Care Plan and Follow-up.  Plan: The Managed Medicaid care management team will reach out to the patient again over the next 30 days.  Date/time of next scheduled RN care management/care coordination outreach:  07/04/24 @ 10:30am  [name] RN, BSN [place]  [facility] RN Care Coordinator

## 2021-05-31 ENCOUNTER — Telehealth: Payer: Self-pay | Admitting: *Deleted

## 2021-06-06 ENCOUNTER — Telehealth: Payer: Self-pay

## 2021-06-06 NOTE — Telephone Encounter (Signed)
**Note De-Identified  Obfuscation** Entresto PA started through covermymeds.  [name] (Key: [id number]) [name] 97-103MG  tablets   Form: [employer] Electronic [place] Form [phone] NCPDP) Determination: Favorable Message from Plan PA Case: [id number] Status: Approved, Coverage Starts on: 06/06/2021 12:00:00 AM, Coverage Ends on: 06/06/2022 12:00:00 AM.  I have notified [facility] - [place], [place] - [address] (Ph: [phone]) of this approval.

## 2021-06-14 ENCOUNTER — Ambulatory Visit: Payer: Medicaid Other | Admitting: Gastroenterology
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
## 2021-06-18 ENCOUNTER — Ambulatory Visit (HOSPITAL_BASED_OUTPATIENT_CLINIC_OR_DEPARTMENT_OTHER): Payer: Medicaid Other | Admitting: Family

## 2021-06-21 ENCOUNTER — Encounter (HOSPITAL_BASED_OUTPATIENT_CLINIC_OR_DEPARTMENT_OTHER): Payer: Self-pay | Admitting: Family

## 2021-06-21 ENCOUNTER — Ambulatory Visit (INDEPENDENT_AMBULATORY_CARE_PROVIDER_SITE_OTHER): Payer: Medicaid Other | Admitting: Family

## 2021-06-21 VITALS — BP 144/90 | HR 86 | Ht 68.0 in | Wt 147.4 lb

## 2021-06-21 DIAGNOSIS — Z95810 Presence of automatic (implantable) cardiac defibrillator: Secondary | ICD-10-CM | POA: Diagnosis not present

## 2021-06-21 DIAGNOSIS — Z720 Tobacco use: Secondary | ICD-10-CM

## 2021-06-21 DIAGNOSIS — I5022 Chronic systolic (congestive) heart failure: Secondary | ICD-10-CM

## 2021-06-21 DIAGNOSIS — I10 Essential (primary) hypertension: Secondary | ICD-10-CM | POA: Diagnosis not present

## 2021-06-21 DIAGNOSIS — I428 Other cardiomyopathies: Secondary | ICD-10-CM | POA: Diagnosis not present

## 2021-06-21 DIAGNOSIS — F1010 Alcohol abuse, uncomplicated: Secondary | ICD-10-CM | POA: Diagnosis not present

## 2021-06-21 MED ORDER — FUROSEMIDE 40 MG PO TABS
ORAL_TABLET | ORAL | 11 refills | Status: DC
Start: 2021-06-21 — End: 2021-07-04

## 2021-06-21 NOTE — Patient Instructions (Signed)
Medication Instructions:  Your Physician recommend you continue on your current medication as directed.    *If you need a refill on your cardiac medications before your next appointment, please call your pharmacy*   Lab Work: Your physician recommends that you return for lab work in today- CMP, CBC, BNP   If you have labs (blood work) drawn today and your tests are completely normal, you will receive your results only by: MyChart Message (if you have MyChart) OR A paper copy in the mail If you have any lab test that is abnormal or we need to change your treatment, we will call you to review the results.   Testing/Procedures: None ordered today    Follow-Up: At [facility], you and your health needs are our priority.  As part of our continuing mission to provide you with exceptional heart care, we have created designated Provider Care Teams.  These Care Teams include your primary Cardiologist (physician) and Advanced Practice Providers (APPs -  Physician Assistants and Nurse Practitioners) who all work together to provide you with the care you need, when you need it.  We recommend signing up for the patient portal called "MyChart".  Sign up information is provided on this After Visit Summary.  MyChart is used to connect with patients for Virtual Visits (Telemedicine).  Patients are able to view lab/test results, encounter notes, upcoming appointments, etc.  Non-urgent messages can be sent to your provider as well.   To learn more about what you can do with MyChart, go to [email or web address].    Your next appointment:   2 month(s)  The format for your next appointment:   In Person  Provider:   Chilton Si, MD or Gillian Shields, NP{  Other Instructions If your kidney function is normal - we will plan to increase your Entresto and maybe add another medicine to help strengthen your heart. If it is not normal - we will use a different medicine. We will call you Monday to let  you know the plan!  Your blood pressure cuff reads 10 points higher than what a manual reading. At home - simply subtract 10 from the top number. Our goal is for your blood pressure to be less than 130/80.  Heart Healthy Diet Recommendations: A low-salt diet is recommended. Meats should be grilled, baked, or boiled. Avoid fried foods. Focus on lean protein sources like fish or chicken with vegetables and fruits. The American Heart Association is a Chief Technology Officer!  American Heart Association Diet and Lifeystyle Recommendations   Exercise recommendations: The American Heart Association recommends 150 minutes of moderate intensity exercise weekly. Try 30 minutes of moderate intensity exercise 4-5 times per week. This could include walking, jogging, or swimming.   Important Information About Sugar
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
## 2021-06-22 LAB — COMPREHENSIVE METABOLIC PANEL
ALT: 61 IU/L — ABNORMAL HIGH (ref 0–44)
AST: 80 IU/L — ABNORMAL HIGH (ref 0–40)
Albumin/Globulin Ratio: 2 (ref 1.2–2.2)
Albumin: 4.7 g/dL (ref 3.8–4.9)
Alkaline Phosphatase: 106 IU/L (ref 44–121)
BUN/Creatinine Ratio: 22 — ABNORMAL HIGH (ref 9–20)
BUN: 20 mg/dL (ref 6–24)
Bilirubin Total: 0.3 mg/dL (ref 0.0–1.2)
CO2: 18 mmol/L — ABNORMAL LOW (ref 20–29)
Calcium: 10 mg/dL (ref 8.7–10.2)
Chloride: 100 mmol/L (ref 96–106)
Creatinine, Ser: 0.93 mg/dL (ref 0.76–1.27)
Globulin, Total: 2.3 g/dL (ref 1.5–4.5)
Glucose: 64 mg/dL — ABNORMAL LOW (ref 70–99)
Potassium: 4 mmol/L (ref 3.5–5.2)
Sodium: 139 mmol/L (ref 134–144)
Total Protein: 7 g/dL (ref 6.0–8.5)
eGFR: 97 mL/min/{1.73_m2} (ref >59)

## 2021-06-22 LAB — CBC
Hematocrit: 38.1 % (ref 37.5–51.0)
Hemoglobin: 13.1 g/dL (ref 13.0–17.7)
MCH: 35.8 pg — ABNORMAL HIGH (ref 26.6–33.0)
MCHC: 34.4 g/dL (ref 31.5–35.7)
MCV: 104 fL — ABNORMAL HIGH (ref 79–97)
Platelets: 145 10*3/uL — ABNORMAL LOW (ref 150–450)
RBC: 3.66 x10E6/uL — ABNORMAL LOW (ref 4.14–5.80)
RDW: 13.1 % (ref 11.6–15.4)
WBC: 5.3 10*3/uL (ref 3.4–10.8)

## 2021-06-22 LAB — BRAIN NATRIURETIC PEPTIDE: BNP: 501.9 pg/mL — ABNORMAL HIGH (ref 0.0–100.0)

## 2021-06-24 ENCOUNTER — Telehealth (HOSPITAL_BASED_OUTPATIENT_CLINIC_OR_DEPARTMENT_OTHER): Payer: Self-pay

## 2021-06-24 DIAGNOSIS — I5043 Acute on chronic combined systolic (congestive) and diastolic (congestive) heart failure: Secondary | ICD-10-CM

## 2021-06-24 MED ORDER — DAPAGLIFLOZIN PROPANEDIOL 10 MG PO TABS: 10.0000 mg | ORAL_TABLET | Freq: Every day | ORAL | 3 refills | Status: DC

## 2021-06-24 MED ORDER — ENTRESTO 49-51 MG PO TABS: 1.0000 | ORAL_TABLET | Freq: Two times a day (BID) | ORAL | 3 refills | Status: DC

## 2021-06-24 NOTE — Progress Notes (Signed)
I spoke with the patient and advised him to call [employer] tech support for assistance.

## 2021-06-24 NOTE — Telephone Encounter (Addendum)
Results called to patient who verbalizes understanding! Labs ordered and mailed, prescriptions updated and sent to pharmacy on file.   [name] formerly approved on march 1 23 via [employer].   ----- Message from Alver Sorrow, NP sent at 06/24/2021  8:17 AM EDT ----- Normal kidney function, electrolytes. CBC stable compared to previous. Platelets mildly low - CBC in 1 month for monitoring.  BNP shows volume overload. Elevated liver enzymes. Ensure avoiding alcohol, acetaminophen (tylenol), and reducing intake of fried foods.   For optimization of heart failure therapy and to treat elevated BNP: Increase Entresto to 49-51mg  BID.  Start Farxiga 10mg  daily. Continue Lasix 40mg  daily. BMP/BNP in 1 week CBC 1 Month

## 2021-07-01 ENCOUNTER — Telehealth: Payer: Self-pay

## 2021-07-01 NOTE — Telephone Encounter (Signed)
Spoke with patient, patient agreeable to appointment with Tommye Standard on Thursday 6/15 at 1:30pm

## 2021-07-01 NOTE — Telephone Encounter (Signed)
Alert from [facility]:   [facility] alert for sustained VT with successful ATP therapy Event occurred 6/10 @ 09:48, EGM shows sustained VT, rate 260 falling in to the VF zone, ATP delivered x1 converting to regular AS/VS.  There is an additional NSVT without therapy  Spoke with patient he reported that he had not taken his medications that morning, mediations include Digoxin 0.125mg , Entresto 49-51, Toprol-XL 50mg . Patient had labs resulted on 06/22/2021. Reiterated importance of taking medications as prescribed. Informed patient of driving restrictions x 6 months patient stated he did not drive.

## 2021-07-02 ENCOUNTER — Ambulatory Visit: Payer: Medicaid Other | Admitting: Family Medicine
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
## 2021-07-04 ENCOUNTER — Ambulatory Visit (INDEPENDENT_AMBULATORY_CARE_PROVIDER_SITE_OTHER): Payer: Medicaid Other | Admitting: Physician Assistant

## 2021-07-04 ENCOUNTER — Encounter: Payer: Self-pay | Admitting: Physician Assistant

## 2021-07-04 ENCOUNTER — Other Ambulatory Visit: Payer: Self-pay | Admitting: *Deleted

## 2021-07-04 ENCOUNTER — Telehealth: Payer: Self-pay | Admitting: *Deleted

## 2021-07-04 VITALS — BP 120/92 | HR 86 | Ht 68.0 in | Wt 142.2 lb

## 2021-07-04 DIAGNOSIS — Z95810 Presence of automatic (implantable) cardiac defibrillator: Secondary | ICD-10-CM | POA: Diagnosis not present

## 2021-07-04 DIAGNOSIS — I472 Ventricular tachycardia, unspecified: Secondary | ICD-10-CM

## 2021-07-04 DIAGNOSIS — Z79899 Other long term (current) drug therapy: Secondary | ICD-10-CM | POA: Diagnosis not present

## 2021-07-04 DIAGNOSIS — I428 Other cardiomyopathies: Secondary | ICD-10-CM | POA: Diagnosis not present

## 2021-07-04 DIAGNOSIS — I10 Essential (primary) hypertension: Secondary | ICD-10-CM | POA: Diagnosis not present

## 2021-07-04 DIAGNOSIS — R079 Chest pain, unspecified: Secondary | ICD-10-CM

## 2021-07-04 LAB — CUP PACEART INCLINIC DEVICE CHECK
Battery Remaining Longevity: 69 mo
Brady Statistic RV Percent Paced: 0 %
Date Time Interrogation Session: 20230615170346
HighPow Impedance: 67.5 Ohm
Implantable Lead Implant Date: 20191230
Implantable Lead Implant Date: 20191230
Implantable Lead Location: 753860
Implantable Lead Location: 753860
Implantable Lead Model: 7122
Implantable Lead Model: 7122
Implantable Pulse Generator Implant Date: 20191230
Lead Channel Impedance Value: 537.5 Ohm
Lead Channel Pacing Threshold Amplitude: 0.5 V
Lead Channel Pacing Threshold Amplitude: 0.5 V
Lead Channel Pacing Threshold Pulse Width: 0.5 ms
Lead Channel Pacing Threshold Pulse Width: 0.5 ms
Lead Channel Sensing Intrinsic Amplitude: 11.6 mV
Lead Channel Setting Pacing Amplitude: 2.5 V
Lead Channel Setting Pacing Pulse Width: 0.5 ms
Lead Channel Setting Sensing Sensitivity: 0.5 mV
Pulse Gen Serial Number: 9850980

## 2021-07-04 MED ORDER — FUROSEMIDE 40 MG PO TABS: 60.0000 mg | ORAL_TABLET | Freq: Two times a day (BID) | ORAL | 1 refills | Status: DC

## 2021-07-04 NOTE — Patient Outreach (Signed)
Medicaid Managed Care   Nurse Care Manager Note  07/04/2021 Name:  [name] MRN:  [id number] DOB:  [birth year]  [name] is an 56 y.o. year old male who is a primary patient of Hoy Register, MD.  The [facility] Managed Care Coordination team was consulted for assistance with:    CHF  [name] was given information about Medicaid Managed Care Coordination team services today. [name] Patient agreed to services and verbal consent obtained.  Engaged with patient by telephone for follow up visit in response to provider referral for case management and/or care coordination services.   Assessments/Interventions:  Review of past medical history, allergies, medications, health status, including review of consultants reports, laboratory and other test data, was performed as part of comprehensive evaluation and provision of chronic care management services.  SDOH (Social Determinants of Health) assessments and interventions performed:   Care Plan  Allergies  Allergen Reactions   Penicillins Other (See Comments)    Blisters  Has patient had a PCN reaction causing immediate rash, facial/tongue/throat swelling, SOB or lightheadedness with hypotension: Yes Has patient had a PCN reaction causing severe rash involving mucus membranes or skin necrosis: Yes Has patient had a PCN reaction that required hospitalization: No Has patient had a PCN reaction occurring within the last 10 years: No If all of the above answers are "NO", then may proceed with Cephalosporin use.     Medications Reviewed Today     Reviewed by Heidi Dach, RN (Registered Nurse) on 07/04/21 at 1052  Med List Status: <None>   Medication Order Taking? Sig Documenting Provider Last Dose Status Informant  albuterol (PROAIR HFA) 108 (90 Base) MCG/ACT inhaler [id number] No Inhale 2 puffs into the lungs every 6 (six) hours as needed for shortness of breath.  Patient taking differently: Inhale 2-3 puffs  into the lungs See admin instructions. Inhale 2-3 puffs into the lungs five to six times a day as needed for shortness of breath or wheezing   Hoy Register, MD Taking Active Self  aspirin 81 MG EC tablet [id number] No Take 81 mg by mouth daily. Swallow whole. [provider] Taking Active Self  cetirizine (ZYRTEC) 10 MG tablet [id number] No TAKE 1 TABLET (10 MG TOTAL) BY MOUTH DAILY.  Patient taking differently: Take 10 mg by mouth daily as needed for allergies or rhinitis.   Hoy Register, MD Taking Active Self  dapagliflozin propanediol (FARXIGA) 10 MG TABS tablet [id number]  Take 1 tablet (10 mg total) by mouth daily before breakfast. Alver Sorrow, NP  Active   digoxin (LANOXIN) 0.125 MG tablet [id number] No Take 1 tablet (0.125 mg total) by mouth daily. [name], Zachary George, NP Taking Expired 06/21/21 2359   fluticasone-salmeterol (ADVAIR DISKUS) 250-50 MCG/ACT AEPB [id number] No Inhale 1 puff into the lungs in the morning and at bedtime. Hoy Register, MD Taking Active   furosemide (LASIX) 40 MG tablet [id number]  TAKE 1 TABLET daily Alver Sorrow, NP  Active   metoprolol succinate (TOPROL-XL) 50 MG 24 hr tablet [id number] No Take 1 tablet (50 mg total) by mouth daily. Take with or immediately following a meal. [name], Zachary George, NP Taking Active   sacubitril-valsartan (ENTRESTO) 49-51 MG [id number]  Take 1 tablet by mouth 2 (two) times daily. Alver Sorrow, NP  Active   spironolactone (ALDACTONE) 25 MG tablet [id number] No Take 0.5 tablets (12.5 mg total) by mouth daily. [name], Zachary George, NP Taking Active   tiotropium (SPIRIVA HANDIHALER) 18  MCG inhalation capsule [id number] No Place 1 capsule (18 mcg total) into inhaler and inhale daily. Hoy Register, MD Taking Active   traZODone (DESYREL) 50 MG tablet [id number] No Take 1 tablet (50 mg total) by mouth at bedtime as needed for sleep. Hoy Register, MD Taking Active             Patient Active Problem List    Diagnosis Date Noted   Acute combined systolic and diastolic congestive heart failure (HCC)    AKI (acute kidney injury) (HCC)    Acute exacerbation of CHF (congestive heart failure) (HCC) 03/05/2021   SVT (supraventricular tachycardia) (HCC) 06/15/2020   Slurred speech 07/05/2019   ICD (implantable cardioverter-defibrillator) in place 07/20/2018   COPD (chronic obstructive pulmonary disease) (HCC) 12/09/2017   Obstructive sleep apnea 09/08/2016   Autonomic dysfunction 07/15/2015   DJD (degenerative joint disease), lumbar 06/19/2015   Tremor 05/14/2015   Abnormality of gait 05/14/2015   Neurogenic bladder 04/17/2015   Lumbar strain 03/23/2015   Leg weakness 03/23/2015   Major depression, chronic    History of stroke 02/26/2015   Benign essential HTN    ETOH abuse    Tobacco abuse    Chronic combined systolic and diastolic heart failure (HCC)    Alcoholic cardiomyopathy (HCC) 26/71/2458   Smoking greater than 20 pack years 04/19/2014   Polysubstance abuse (HCC) 03/22/2014    Conditions to be addressed/monitored per PCP order:  CHF  Care Plan : RN Care Manager Plan of Care  Updates made by Heidi Dach, RN since 07/04/2021 12:00 AM     Problem: Health Management needs related to CHF      Long-Range Goal: Development of Plan of Care to address Health Management needs related to CHF   Start Date: 05/16/2021  Expected End Date: 08/14/2021  Priority: High  Note:   Current Barriers:  Chronic Disease Management support and education needs related to CHF [name] missed scheduled GI visit and will need to reschedule. He has not been scheduled for HF Clinic. He reports having all medications delivered by [facility], his sister prepares his medications for 2 weeks. Working on cutting back on smoking and drinking alcohol.   RNCM Clinical Goal(s):  Patient will verbalize understanding of plan for management of CHF as evidenced by patient reports take all medications exactly  as prescribed and will call provider for medication related questions as evidenced by patient reports and documentation in EMR    attend all scheduled medical appointments: PCP on 8/16, Cardiology 08/22/21 and GI on 06/14/21-needs to reschedule as evidenced by provider documentation in EMR        work with community resource care guide to address needs related to Limited access to food and Level of care concerns as evidenced by patient and/or community resource care guide support    through collaboration with Medical illustrator, provider, and care team.   Interventions: Inter-disciplinary care team collaboration (see longitudinal plan of care) Evaluation of current treatment plan related to  self management and patient's adherence to plan as established by provider Provided the information to [facility] [phone] not had a chance to schedule eye exam  Provided patient with [facility] [phone], call to reschedule missed appointment   Heart Failure Interventions:  (Status: Goal on Track (progressing): YES.)  Long Term Goal  Wt Readings from Last 3 Encounters:  06/21/21 147 lb 6.4 oz (66.9 kg)  03/27/21 145 lb 9.6 oz (66 kg)  03/14/21 148 lb 14.4 oz (  67.5 kg)   Reviewed role of diuretics in prevention of fluid overload and management of heart failure Discussed the importance of keeping all appointments with provider Provided patient with education about the role of exercise in the management of heart failure Advised patient to discuss any concerns or questions with provider Assessed social determinant of health barriers Discussed the importance of smoking and alcohol cessation, education provided/mailed Advised patient to take all medications to provider appointments-Patient did not have medications with him today, unable to review medications today Collaborated with HF Clinic on referral status/scheduling-waiting for response Discussed fluid restrictions  Patient Goals/Self-Care  Activities: Take medications as prescribed   Attend all scheduled provider appointments Call pharmacy for medication refills 3-7 days in advance of running out of medications Call provider office for new concerns or questions  use salt in moderation watch for swelling in feet, ankles and legs every day eat more whole grains, fruits and vegetables, lean meats and healthy fats       Follow Up:  Patient agrees to Care Plan and Follow-up.  Plan: The Managed Medicaid care management team will reach out to the patient again over the next 30 days.  Date/time of next scheduled RN care management/care coordination outreach:  08/05/21 @ 10:30am  [name] RN, BSN [place]  [facility] RN Care Coordinator

## 2021-07-04 NOTE — Patient Instructions (Addendum)
Medication Instructions:   START TAKING:  FUROSEMIDE ( LASIX)  60 MG TWICE A DAY   *If you need a refill on your cardiac medications before your next appointment, please call your pharmacy*   Lab Work: BNP CMET AND DIG LEVEL  TODAY    If you have labs (blood work) drawn today and your tests are completely normal, you will receive your results only by: MyChart Message (if you have MyChart) OR A paper copy in the mail If you have any lab test that is abnormal or we need to change your treatment, we will call you to review the results.   Testing/Procedures: NONE ORDERED  TODAY       Follow-Up: At [facility], you and your health needs are our priority.  As part of our continuing mission to provide you with exceptional heart care, we have created designated Provider Care Teams.  These Care Teams include your primary Cardiologist (physician) and Advanced Practice Providers (APPs -  Physician Assistants and Nurse Practitioners) who all work together to provide you with the care you need, when you need it.  We recommend signing up for the patient portal called "MyChart".  Sign up information is provided on this After Visit Summary.  MyChart is used to connect with patients for Virtual Visits (Telemedicine).  Patients are able to view lab/test results, encounter notes, upcoming appointments, etc.  Non-urgent messages can be sent to your provider as well.   To learn more about what you can do with MyChart, go to [email or web address].    Your next appointment:  DR [facility]   ASAP  SOONER THAN  SCHEDULED APPOINTMENT WITH CA[facility]   2 month(s)  The format for your next appointment:   In Person  Provider:   You may see Will Jorja Loa, MD or one of the following Advanced Practice Providers on your designated Care Team:   Francis Dowse, [place]  Other Instructions  Important Information About Sugar  -

## 2021-07-04 NOTE — Patient Instructions (Signed)
Visit Information  [name] was given information about Medicaid Managed Care team care coordination services as a part of their Healthy [facility] Medicaid benefit. [name] verbally consented to engagement with the [facility] Managed Care team.   If you are experiencing a medical emergency, please call 911 or report to your local emergency department or urgent care.   If you have a non-emergency medical problem during routine business hours, please contact your provider's office and ask to speak with a nurse.   For questions related to your Healthy [facility] health plan, please call: [phone] or visit the homepage here: [email or web address]  If you would like to schedule transportation through your Healthy [facility] plan, please call the following number at least 2 days in advance of your appointment: [phone]  For information about your ride after you set it up, call Ride Assist at [phone]. Use this number to activate a Will Call pickup, or if your transportation is late for a scheduled pickup. Use this number, too, if you need to make a change or cancel a previously scheduled reservation.  If you need transportation services right away, call [phone]. The after-hours call center is staffed 24 hours to handle ride assistance and urgent reservation requests (including discharges) 365 days a year. Urgent trips include sick visits, hospital discharge requests and life-sustaining treatment.  Call the [facility] Line at [phone], at any time, 24 hours a day, 7 days a week. If you are in danger or need immediate medical attention call 911.  If you would like help to quit smoking, call 1-800-QUIT-NOW ((425)470-1961) OR Espaol: 1-855-Djelo-Ya (8-546-270-3500) o para ms informacin haga clic aqu or Text READY to 938-182 to register via text  [name],   Please see education materials related to smoking cessation  and HF provided as print materials.   The patient verbalized understanding of instructions, educational materials, and care plan provided today and agreed to receive a mailed copy of patient instructions, educational materials, and care plan.   Telephone follow up appointment with Managed Medicaid care management team member scheduled for:08/05/21 @ 10:30am  Cole Emms RN, BSN [place]  [facility] RN Care Coordinator   Following is a copy of your plan of care:  Care Plan : RN Care Manager Plan of Care  Updates made by Heidi Dach, RN since 07/04/2021 12:00 AM     Problem: Health Management needs related to CHF      Long-Range Goal: Development of Plan of Care to address Health Management needs related to CHF   Start Date: 05/16/2021  Expected End Date: 08/14/2021  Priority: High  Note:   Current Barriers:  Chronic Disease Management support and education needs related to CHF [name] missed scheduled GI visit and will need to reschedule. He has not been scheduled for HF Clinic. He reports having all medications delivered by [facility], his sister prepares his medications for 2 weeks. Working on cutting back on smoking and drinking alcohol.   RNCM Clinical Goal(s):  Patient will verbalize understanding of plan for management of CHF as evidenced by patient reports take all medications exactly as prescribed and will call provider for medication related questions as evidenced by patient reports and documentation in EMR    attend all scheduled medical appointments: PCP on 8/16, Cardiology 08/22/21 and GI on 06/14/21-needs to reschedule as evidenced by provider documentation in EMR        work with community resource care guide to address needs  related to Limited access to food and Level of care concerns as evidenced by patient and/or community resource care guide support    through collaboration with Medical illustrator, provider, and care team.    Interventions: Inter-disciplinary care team collaboration (see longitudinal plan of care) Evaluation of current treatment plan related to  self management and patient's adherence to plan as established by provider Provided the information to [facility] [phone] not had a chance to schedule eye exam  Provided patient with [facility] [phone], call to reschedule missed appointment   Heart Failure Interventions:  (Status: Goal on Track (progressing): YES.)  Long Term Goal  Wt Readings from Last 3 Encounters:  06/21/21 147 lb 6.4 oz (66.9 kg)  03/27/21 145 lb 9.6 oz (66 kg)  03/14/21 148 lb 14.4 oz (67.5 kg)   Reviewed role of diuretics in prevention of fluid overload and management of heart failure Discussed the importance of keeping all appointments with provider Provided patient with education about the role of exercise in the management of heart failure Advised patient to discuss any concerns or questions with provider Assessed social determinant of health barriers Discussed the importance of smoking and alcohol cessation, education provided/mailed Advised patient to take all medications to provider appointments-Patient did not have medications with him today, unable to review medications today Collaborated with HF Clinic on referral status/scheduling-waiting for response Discussed fluid restrictions  Patient Goals/Self-Care Activities: Take medications as prescribed   Attend all scheduled provider appointments Call pharmacy for medication refills 3-7 days in advance of running out of medications Call provider office for new concerns or questions  use salt in moderation watch for swelling in feet, ankles and legs every day eat more whole grains, fruits and vegetables, lean meats and healthy fats

## 2021-07-04 NOTE — Telephone Encounter (Cosign Needed)
Spoke with patient about recommendations and was agreeable to it .Patient aware of  labs were tooken today and take Metoprolol 25 mg  no later that 2 hours prior to scheduled time. Patient aware of prior authrization with coverages someone will call back to schedule procedure date and time and instructions.

## 2021-07-04 NOTE — Telephone Encounter (Signed)
-----   Message from [facility], [place] sent at 07/04/2021  4:14 PM EDT ----- [name] would like [name] to do a coronary CT angiogram, please let him know she got back to [name] and would like him to get the CT done.

## 2021-07-05 LAB — COMPREHENSIVE METABOLIC PANEL
ALT: 53 IU/L — ABNORMAL HIGH (ref 0–44)
AST: 51 IU/L — ABNORMAL HIGH (ref 0–40)
Albumin/Globulin Ratio: 2 (ref 1.2–2.2)
Albumin: 5.1 g/dL — ABNORMAL HIGH (ref 3.8–4.9)
Alkaline Phosphatase: 99 IU/L (ref 44–121)
BUN/Creatinine Ratio: 14 (ref 9–20)
BUN: 15 mg/dL (ref 6–24)
Bilirubin Total: 0.7 mg/dL (ref 0.0–1.2)
CO2: 22 mmol/L (ref 20–29)
Calcium: 10.4 mg/dL — ABNORMAL HIGH (ref 8.7–10.2)
Chloride: 99 mmol/L (ref 96–106)
Creatinine, Ser: 1.04 mg/dL (ref 0.76–1.27)
Globulin, Total: 2.5 g/dL (ref 1.5–4.5)
Glucose: 71 mg/dL (ref 70–99)
Potassium: 4.2 mmol/L (ref 3.5–5.2)
Sodium: 139 mmol/L (ref 134–144)
Total Protein: 7.6 g/dL (ref 6.0–8.5)
eGFR: 85 mL/min/{1.73_m2} (ref >59)

## 2021-07-05 LAB — MAGNESIUM: Magnesium: 1.5 mg/dL — ABNORMAL LOW (ref 1.6–2.3)

## 2021-07-05 LAB — PRO B NATRIURETIC PEPTIDE: NT-Pro BNP: 2598 pg/mL — ABNORMAL HIGH (ref 0–210)

## 2021-07-05 LAB — DIGOXIN LEVEL: Digoxin, Serum: <0.4 ng/mL — ABNORMAL LOW (ref 0.5–0.9)

## 2021-07-08 ENCOUNTER — Telehealth: Payer: Self-pay | Admitting: *Deleted

## 2021-07-08 DIAGNOSIS — Z79899 Other long term (current) drug therapy: Secondary | ICD-10-CM

## 2021-07-08 MED ORDER — MAGNESIUM OXIDE 400 MG PO TABS
400.0000 mg | ORAL_TABLET | Freq: Every day | ORAL | 1 refills | Status: DC
Start: 2021-07-08 — End: 2021-09-26

## 2021-07-08 NOTE — Telephone Encounter (Signed)
-----   Message from Sheilah Pigeon, [place] sent at 07/05/2021 11:01 AM EDT ----- Lab confirms our suspicion yesterday that he has fluid retention.  Continue as planned with the increased lasix dosing as we planned and repeat lab in 1 week Mag was just a touch low, given he had the arrhythmia, please start mag ox 400mg  daily and repeat mag level as well.  Keep appt as scheduled, continue as planned for coronary CT

## 2021-07-08 NOTE — Telephone Encounter (Signed)
Lvm on sister phone per patient request to contact back for recommendations and labs redraw

## 2021-07-12 ENCOUNTER — Telehealth (HOSPITAL_COMMUNITY): Payer: Self-pay | Admitting: Emergency Medicine

## 2021-07-12 NOTE — Telephone Encounter (Signed)
Reaching out to patient to offer assistance regarding upcoming cardiac imaging study; pt verbalizes understanding of appt date/time, parking situation and where to check in, pre-test NPO status and medications ordered, and verified current allergies; name and call back number provided for further questions should they arise Rockwell Alexandria RN Navigator Cardiac Imaging [facility] [phone] office [phone] cell  Denies iv issues  Arrival 1230 Taking metoprolol 2 hr prior to scan

## 2021-07-15 ENCOUNTER — Ambulatory Visit (HOSPITAL_COMMUNITY)
Admission: RE | Admit: 2021-07-15 | Discharge: 2021-07-15 | Disposition: A | Discharge status: Home / Self Care | Payer: Medicaid Other | Source: Ambulatory Visit | Attending: Physician Assistant | Admitting: Physician Assistant

## 2021-07-15 DIAGNOSIS — R079 Chest pain, unspecified: Secondary | ICD-10-CM | POA: Diagnosis not present

## 2021-07-15 MED ORDER — NITROGLYCERIN 0.4 MG SL SUBL
0.8000 mg | SUBLINGUAL_TABLET | Freq: Once | SUBLINGUAL | Status: AC
  Administered 2021-07-15: 0.8 mg via SUBLINGUAL

## 2021-07-15 MED ORDER — NITROGLYCERIN 0.4 MG SL SUBL
SUBLINGUAL_TABLET | SUBLINGUAL | Status: AC
  Filled 2021-07-15: qty 2

## 2021-07-15 MED ORDER — IOHEXOL 350 MG/ML SOLN
100.0000 mL | Freq: Once | INTRAVENOUS | Status: AC | PRN
  Administered 2021-07-15: 100 mL via INTRAVENOUS

## 2021-07-15 MED ORDER — METOPROLOL TARTRATE 5 MG/5ML IV SOLN: 10.0000 mg | INTRAVENOUS | Status: DC | PRN

## 2021-07-17 ENCOUNTER — Other Ambulatory Visit: Payer: Medicaid Other

## 2021-07-17 DIAGNOSIS — I5043 Acute on chronic combined systolic (congestive) and diastolic (congestive) heart failure: Secondary | ICD-10-CM | POA: Diagnosis not present

## 2021-07-17 LAB — CBC
Hematocrit: 45.2 % (ref 37.5–51.0)
Hemoglobin: 15.6 g/dL (ref 13.0–17.7)
MCH: 35.5 pg — ABNORMAL HIGH (ref 26.6–33.0)
MCHC: 34.5 g/dL (ref 31.5–35.7)
MCV: 103 fL — ABNORMAL HIGH (ref 79–97)
Platelets: 152 10*3/uL (ref 150–450)
RBC: 4.39 x10E6/uL (ref 4.14–5.80)
RDW: 12.4 % (ref 11.6–15.4)
WBC: 5.3 10*3/uL (ref 3.4–10.8)

## 2021-07-18 ENCOUNTER — Telehealth (HOSPITAL_BASED_OUTPATIENT_CLINIC_OR_DEPARTMENT_OTHER): Payer: Self-pay

## 2021-07-18 LAB — BASIC METABOLIC PANEL
BUN/Creatinine Ratio: 11 (ref 9–20)
BUN: 13 mg/dL (ref 6–24)
CO2: 22 mmol/L (ref 20–29)
Calcium: 10.1 mg/dL (ref 8.7–10.2)
Chloride: 98 mmol/L (ref 96–106)
Creatinine, Ser: 1.23 mg/dL (ref 0.76–1.27)
Glucose: 102 mg/dL — ABNORMAL HIGH (ref 70–99)
Potassium: 4.2 mmol/L (ref 3.5–5.2)
Sodium: 139 mmol/L (ref 134–144)
eGFR: 69 mL/min/{1.73_m2} (ref >59)

## 2021-07-18 LAB — BRAIN NATRIURETIC PEPTIDE: BNP: 673.3 pg/mL — ABNORMAL HIGH (ref 0.0–100.0)

## 2021-07-18 NOTE — Telephone Encounter (Addendum)
Results called to patient who verbalizes understanding!    ----- Message from Alver Sorrow, NP sent at 07/18/2021 10:37 AM EDT ----- Platelet count improved. Good result!

## 2021-07-19 ENCOUNTER — Telehealth (HOSPITAL_BASED_OUTPATIENT_CLINIC_OR_DEPARTMENT_OTHER): Payer: Self-pay

## 2021-07-19 NOTE — Telephone Encounter (Addendum)
Left message for patient to call back.     ----- Message from Alver Sorrow, NP sent at 07/19/2021 10:48 AM EDT ----- Stable kidney function. Normal electrolytes. BNP with volume overload.   Increase spironolactone to 25 mg daily. Increase Lasix to 80 mg twice daily. BMP, magnesium labs in 1 week Ensure he is taking Sherryll Burger, [place]. F/u as scheduled.

## 2021-07-22 ENCOUNTER — Telehealth (HOSPITAL_BASED_OUTPATIENT_CLINIC_OR_DEPARTMENT_OTHER): Payer: Self-pay

## 2021-07-22 ENCOUNTER — Ambulatory Visit (HOSPITAL_BASED_OUTPATIENT_CLINIC_OR_DEPARTMENT_OTHER): Payer: Medicaid Other | Admitting: Family

## 2021-07-22 DIAGNOSIS — Z79899 Other long term (current) drug therapy: Secondary | ICD-10-CM

## 2021-07-22 MED ORDER — SPIRONOLACTONE 25 MG PO TABS: 25.0000 mg | ORAL_TABLET | Freq: Every day | ORAL | 3 refills | Status: DC

## 2021-07-22 MED ORDER — FUROSEMIDE 80 MG PO TABS
80.0000 mg | ORAL_TABLET | Freq: Two times a day (BID) | ORAL | 3 refills | Status: DC
Start: 2021-07-22 — End: 2021-09-18

## 2021-07-22 NOTE — Telephone Encounter (Addendum)
Results called to patient who verbalizes understanding, Labs ordered and mailed, prescriptions updated and sent to pharmacy on file       ----- Message from Alver Sorrow, NP sent at 07/19/2021 10:48 AM EDT ----- Stable kidney function. Normal electrolytes. BNP with volume overload.   Increase spironolactone to 25 mg daily. Increase Lasix to 80 mg twice daily. BMP, magnesium labs in 1 week  Ensure he is taking Sherryll Burger, [place]. F/u as scheduled.

## 2021-08-01 ENCOUNTER — Ambulatory Visit (HOSPITAL_BASED_OUTPATIENT_CLINIC_OR_DEPARTMENT_OTHER): Payer: Medicaid Other | Admitting: Family

## 2021-08-05 ENCOUNTER — Other Ambulatory Visit: Payer: Self-pay | Admitting: *Deleted

## 2021-08-05 NOTE — Patient Outreach (Signed)
  Medicaid Managed Care   Unsuccessful Attempt Note   08/05/2021 Name: [name] MRN: [id number] DOB: 09/09/1965  Referred by: Hoy Register, MD Reason for referral : High Risk Managed Medicaid (Unsuccessful RNCM follow up telephone outreach)   An unsuccessful telephone outreach was attempted today. The patient was referred to the case management team for assistance with care management and care coordination.    Follow Up Plan: A HIPAA compliant phone message was left for the patient providing contact information and requesting a return call.    [name] RN, BSN [place]  [facility]

## 2021-08-05 NOTE — Patient Instructions (Signed)
Visit Information  [name]  - as a part of your Medicaid benefit, you are eligible for care management and care coordination services at no cost or copay. I was unable to reach you by phone today but would be happy to help you with your health related needs. Please feel free to call me @ [phone].   A member of the Managed Medicaid care management team will reach out to you again over the next 14 days.   [name] RN, BSN [place]  [facility] RN Care Coordinator   

## 2021-08-06 ENCOUNTER — Other Ambulatory Visit: Payer: Self-pay | Admitting: *Deleted

## 2021-08-06 NOTE — Patient Outreach (Signed)
Care Coordination  08/06/2021  KHALEN STYER 1965/12/25 [id number]   Medicaid Managed Care   Unsuccessful Outreach Note  08/06/2021 Name: [name] MRN: [id number] DOB: 1965/07/10  Referred by: Hoy Register, MD Reason for referral : High Risk Managed Medicaid (Unsuccessful RNCM follow up telephone outreach)   A second unsuccessful telephone outreach was attempted today. The patient was referred to the case management team for assistance with care management and care coordination.   Follow Up Plan: A HIPAA compliant phone message was left for the patient providing contact information and requesting a return call.   [name] RN, BSN [place]  [facility]

## 2021-08-06 NOTE — Patient Instructions (Signed)
Visit Information  [name]  - as a part of your Medicaid benefit, you are eligible for care management and care coordination services at no cost or copay. I was unable to reach you by phone today but would be happy to help you with your health related needs. Please feel free to call me @ [phone].   A member of the Managed Medicaid care management team will reach out to you again over the next 14 days.   [name] RN, BSN [place]  [facility]

## 2021-08-12 ENCOUNTER — Ambulatory Visit (INDEPENDENT_AMBULATORY_CARE_PROVIDER_SITE_OTHER): Payer: Medicaid Other

## 2021-08-12 DIAGNOSIS — I428 Other cardiomyopathies: Secondary | ICD-10-CM | POA: Diagnosis not present

## 2021-08-15 LAB — CUP PACEART REMOTE DEVICE CHECK
Battery Remaining Longevity: 68 mo
Battery Remaining Percentage: 68 %
Battery Voltage: 2.96 V
Brady Statistic RV Percent Paced: 0 %
Date Time Interrogation Session: 20230726223433
HighPow Impedance: 59 Ohm
HighPow Impedance: 59 Ohm
Implantable Lead Implant Date: 20191230
Implantable Lead Location: 753860
Implantable Lead Model: 7122
Implantable Pulse Generator Implant Date: 20191230
Lead Channel Impedance Value: 480 Ohm
Lead Channel Pacing Threshold Amplitude: 0.5 V
Lead Channel Pacing Threshold Pulse Width: 0.5 ms
Lead Channel Sensing Intrinsic Amplitude: 11.6 mV
Lead Channel Setting Pacing Amplitude: 2.5 V
Lead Channel Setting Pacing Pulse Width: 0.5 ms
Lead Channel Setting Sensing Sensitivity: 0.5 mV
Pulse Gen Serial Number: 9850980

## 2021-08-20 ENCOUNTER — Ambulatory Visit: Payer: Self-pay

## 2021-08-22 ENCOUNTER — Ambulatory Visit (HOSPITAL_BASED_OUTPATIENT_CLINIC_OR_DEPARTMENT_OTHER): Payer: Medicaid Other | Admitting: Family

## 2021-09-03 ENCOUNTER — Telehealth: Payer: Self-pay | Admitting: *Deleted

## 2021-09-03 ENCOUNTER — Other Ambulatory Visit: Payer: Self-pay | Admitting: Family Medicine

## 2021-09-03 DIAGNOSIS — G4709 Other insomnia: Secondary | ICD-10-CM

## 2021-09-03 MED ORDER — METOPROLOL SUCCINATE ER 100 MG PO TB24: 100.0000 mg | ORAL_TABLET | Freq: Every day | ORAL | 1 refills | Status: DC

## 2021-09-03 NOTE — Telephone Encounter (Signed)
-----   Message from Will Jorja Loa, MD sent at 08/15/2021  3:36 PM EDT ----- Abnormal device interrogation reviewed.  Lead parameters and battery status stable.  SVT and NSVT noted.increase metoprolol to 100 mg

## 2021-09-03 NOTE — Telephone Encounter (Signed)
Informed patient of results and verbal understanding expressed. Rx sent to pharmacy.  

## 2021-09-03 NOTE — Telephone Encounter (Signed)
Requested Prescriptions  Pending Prescriptions Disp Refills  . traZODone (DESYREL) 50 MG tablet [Pharmacy Med Name: TRAZODONE HCL 50 MG ORAL TABLET] 30 tablet 2    Sig: TAKE 1 TABLET (50 MG TOTAL) BY MOUTH AT BEDTIME AS NEEDED FOR SLEEP.     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

## 2021-09-04 ENCOUNTER — Ambulatory Visit: Payer: Medicaid Other | Admitting: Physician Assistant

## 2021-09-06 ENCOUNTER — Ambulatory Visit (HOSPITAL_COMMUNITY)
Admission: RE | Admit: 2021-09-06 | Discharge: 2021-09-06 | Disposition: A | Discharge status: Home / Self Care | Payer: Medicaid Other | Source: Ambulatory Visit | Attending: Cardiology | Admitting: Cardiology

## 2021-09-06 ENCOUNTER — Encounter: Payer: Self-pay | Admitting: *Deleted

## 2021-09-06 VITALS — BP 100/80 | HR 104 | Wt 138.0 lb

## 2021-09-06 DIAGNOSIS — Z8673 Personal history of transient ischemic attack (TIA), and cerebral infarction without residual deficits: Secondary | ICD-10-CM | POA: Insufficient documentation

## 2021-09-06 DIAGNOSIS — Z95810 Presence of automatic (implantable) cardiac defibrillator: Secondary | ICD-10-CM | POA: Insufficient documentation

## 2021-09-06 DIAGNOSIS — I5022 Chronic systolic (congestive) heart failure: Secondary | ICD-10-CM | POA: Diagnosis not present

## 2021-09-06 DIAGNOSIS — F17210 Nicotine dependence, cigarettes, uncomplicated: Secondary | ICD-10-CM | POA: Insufficient documentation

## 2021-09-06 DIAGNOSIS — Z79899 Other long term (current) drug therapy: Secondary | ICD-10-CM | POA: Diagnosis not present

## 2021-09-06 DIAGNOSIS — Z8249 Family history of ischemic heart disease and other diseases of the circulatory system: Secondary | ICD-10-CM | POA: Diagnosis not present

## 2021-09-06 DIAGNOSIS — I428 Other cardiomyopathies: Secondary | ICD-10-CM | POA: Diagnosis not present

## 2021-09-06 DIAGNOSIS — J449 Chronic obstructive pulmonary disease, unspecified: Secondary | ICD-10-CM | POA: Insufficient documentation

## 2021-09-06 DIAGNOSIS — I5042 Chronic combined systolic (congestive) and diastolic (congestive) heart failure: Secondary | ICD-10-CM

## 2021-09-06 DIAGNOSIS — I2510 Atherosclerotic heart disease of native coronary artery without angina pectoris: Secondary | ICD-10-CM | POA: Diagnosis not present

## 2021-09-06 DIAGNOSIS — I110 Hypertensive heart disease with heart failure: Secondary | ICD-10-CM | POA: Diagnosis not present

## 2021-09-06 DIAGNOSIS — Z7984 Long term (current) use of oral hypoglycemic drugs: Secondary | ICD-10-CM | POA: Diagnosis not present

## 2021-09-06 DIAGNOSIS — Z006 Encounter for examination for normal comparison and control in clinical research program: Secondary | ICD-10-CM

## 2021-09-06 LAB — CBC
HCT: 34.1 % — ABNORMAL LOW (ref 39.0–52.0)
Hemoglobin: 12 g/dL — ABNORMAL LOW (ref 13.0–17.0)
MCH: 35.7 pg — ABNORMAL HIGH (ref 26.0–34.0)
MCHC: 35.2 g/dL (ref 30.0–36.0)
MCV: 101.5 fL — ABNORMAL HIGH (ref 80.0–100.0)
Platelets: 182 10*3/uL (ref 150–400)
RBC: 3.36 MIL/uL — ABNORMAL LOW (ref 4.22–5.81)
RDW: 12.8 % (ref 11.5–15.5)
WBC: 5.3 10*3/uL (ref 4.0–10.5)
nRBC: 0 % (ref 0.0–0.2)

## 2021-09-06 LAB — BASIC METABOLIC PANEL
Anion gap: 9 (ref 5–15)
BUN: 14 mg/dL (ref 6–20)
CO2: 17 mmol/L — ABNORMAL LOW (ref 22–32)
Calcium: 9.8 mg/dL (ref 8.9–10.3)
Chloride: 111 mmol/L (ref 98–111)
Creatinine, Ser: 1.01 mg/dL (ref 0.61–1.24)
GFR, Estimated: >60 mL/min (ref >60)
Glucose, Bld: 76 mg/dL (ref 70–99)
Potassium: 4.2 mmol/L (ref 3.5–5.1)
Sodium: 137 mmol/L (ref 135–145)

## 2021-09-06 LAB — BRAIN NATRIURETIC PEPTIDE: B Natriuretic Peptide: 1259.9 pg/mL — ABNORMAL HIGH (ref 0.0–100.0)

## 2021-09-06 MED ORDER — SPIRONOLACTONE 25 MG PO TABS
25.0000 mg | ORAL_TABLET | Freq: Every evening | ORAL | 3 refills | Status: DC
Start: 2021-09-06 — End: 2021-09-26

## 2021-09-06 MED ORDER — METOPROLOL SUCCINATE ER 100 MG PO TB24: 100.0000 mg | ORAL_TABLET | Freq: Every evening | ORAL | 3 refills | Status: DC

## 2021-09-06 NOTE — Research (Signed)
Spoke with patient about Psychiatrist study Gave all information about study  Patient will call back to schedule appt for screening   Mercer Pod :) RN BSN  Clinical Research Nurse  Be strong and take heart, all you who hope in the Lord. ~ Psalm 31:24

## 2021-09-06 NOTE — Patient Instructions (Signed)
Change Toprol XL and Spironolactone to nightly.  Labs done today, your results will be available in MyChart, we will contact you for abnormal readings.  Your physician has recommended that you have a cardiopulmonary stress test (CPX). CPX testing is a non-invasive measurement of heart and lung function. It replaces a traditional treadmill stress test. This type of test provides a tremendous amount of information that relates not only to your present condition but also for future outcomes. This test combines measurements of you ventilation, respiratory gas exchange in the lungs, electrocardiogram (EKG), blood pressure and physical response before, during, and following an exercise protocol.  Your physician has requested that you have a cardiac MRI. Cardiac MRI uses a computer to create images of your heart as its beating, producing both still and moving pictures of your heart and major blood vessels. For further information please visit [email or web address]. Please follow the instruction sheet given to you today for more information. ONCE APPROVED BY YOUR INSURANCE COMPANY YOU WILL BE CALLED TO ARRANGE THE TEST.  Your physician recommends that you schedule a follow-up appointment in: 6 weeks  If you have any questions or concerns before your next appointment please send [name] a message through [place] or call our office at [phone].    TO LEAVE A MESSAGE FOR THE NURSE SELECT OPTION 2, PLEASE LEAVE A MESSAGE INCLUDING: YOUR NAME DATE OF BIRTH CALL BACK NUMBER REASON FOR CALL**this is important as we prioritize the call backs  YOU WILL RECEIVE A CALL BACK THE SAME DAY AS LONG AS YOU CALL BEFORE 4:00 PM  At the [facility], you and your health needs are our priority. As part of our continuing mission to provide you with exceptional heart care, we have created designated Provider Care Teams. These Care Teams include your primary Cardiologist (physician) and Advanced Practice  Providers (APPs- Physician Assistants and Nurse Practitioners) who all work together to provide you with the care you need, when you need it.   You may see any of the following providers on your designated Care Team at your next follow up: Dr Arvilla Meres Dr Carron Curie, NP Robbie Lis, [place] [facility], [place] Karle Plumber, PharmD   Please be sure to bring in all your medications bottles to every appointment.

## 2021-09-08 NOTE — Progress Notes (Addendum)
PCP: Hoy Register, MD HF Cardiology: Dr. Shirlee Latch  56 y.o. with history of nonischemic cardiomyopathy was referred by Eligha Bridegroom for evaluation of CHF.  Cardiomyopathy has been diagnosed since 2016 when echo showed EF 20%.  He has a history of cocaine use and ETOH abuse. Currently, drinking about 3 beers/day.  He is smoking.  No recent cocaine.  He had a stroke in 2017 in setting of uncontrolled HTN and cocaine abuse.  He has a Secondary school teacher ICD.  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  Last echo in 2/23 showed EF < 20%, severe LV dilation, moderately decreased RV systolic function with mild RV enlargement. He has 13 siblings, none of whom have known cardiac disease.  His grandmother had CHF and there apparently was conversation about a heart transplant but she passed away.   Patient reports shortness of breath after walking about 1/4 mile around a track.  He is short of breath with stairs.  No orthopnea/PND.  He has not been using Lasix.  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  No chest pain.  Occasional mild lightheadedness with standing but not particularly bothersome.  He had some ankle edema last week but had been out of Lasix for a couple of days, now resolved.   ECG (personally reviewed): sinus tachycardia, left axis deviation, LVH with repolarization abnormality.   Labs (6/23): hgb 15.6, BNP 673, K 4.2, creatinine 1.23  PMH: 1. H/o seizure disorder: ?ETOH. 2. HTN 3. 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  4. H/o SVT 5. OSA 6. COPD: Active smoker 7. VT: St Jude ICD.  ATP 6/23.  8. Chronic systolic CHF: Nonischemic cardiomyopathy.  Documented since at least 2016. St Jude ICD.  - Echo (3/16): EF 20% - Echo (2/23): EF < 20%, severe LV dilation, moderately decreased RV systolic function with mild RV enlargement.  - Coronary CTA (6/23): 81st percentile calcium score, mild nonobstructive CAD.  9. Prior h/o cocaine  Social History   Socioeconomic History    Marital status: Single    Spouse name: Not on file   Number of children: 1   Years of education: 12   Highest education level: Not on file  Occupational History   Not on file  Tobacco Use   Smoking status: Every Day    Packs/day: 1.00    Years: 20.00    Total pack years: 20.00    Types: Cigarettes   Smokeless tobacco: Never   Tobacco comments:    05/14/16 smoking 1 PPD  Vaping Use   Vaping Use: Never used  Substance and Sexual Activity   Alcohol use: Yes    Alcohol/week: 1.0 standard drink of alcohol    Types: 1 Cans of beer per week    Comment: socially    Drug use: Not Currently    Types: Cocaine    Comment: stopped using cocaine 11/19/14   Sexual activity: Not on file  Other Topics Concern   Not on file  Social History Narrative   Lives alone, nurse comes 2 hrs daily   Social Determinants of Health   Financial Resource Strain: Not on file  Food Insecurity: Food Insecurity Present (05/31/2021)   Hunger Vital Sign    Worried About Running Out of Food in the Last Year: Often true    Ran Out of Food in the Last Year: Sometimes true  Transportation Needs: Unmet Transportation Needs (05/31/2021)   PRAPARE - Administrator, Civil Service (Medical): Yes  Lack of Transportation (Non-Medical): Yes  Physical Activity: Not on file  Stress: Not on file  Social Connections: Not on file  Intimate Partner Violence: Not on file   Family History  Problem Relation Age of Onset   Hypertension Mother    Hyperlipidemia Mother    Hypertension Father    Stroke Maternal Aunt    Hypertension Sister    Hypertension Brother    ROS: All systems reviewed and negative except as per HPI.   Current Outpatient Medications  Medication Sig Dispense Refill   albuterol (PROAIR HFA) 108 (90 Base) MCG/ACT inhaler Inhale 2 puffs into the lungs every 6 (six) hours as needed for shortness of breath. 18 g 6   aspirin 81 MG EC tablet Take 81 mg by mouth daily. Swallow whole.      cetirizine (ZYRTEC) 10 MG tablet TAKE 1 TABLET (10 MG TOTAL) BY MOUTH DAILY. 30 tablet 0   dapagliflozin propanediol (FARXIGA) 10 MG TABS tablet Take 1 tablet (10 mg total) by mouth daily before breakfast. 90 tablet 3   digoxin (LANOXIN) 0.125 MG tablet Take 1 tablet (0.125 mg total) by mouth daily. 30 tablet 11   fluticasone-salmeterol (ADVAIR DISKUS) 250-50 MCG/ACT AEPB Inhale 1 puff into the lungs in the morning and at bedtime. 1 each 3   furosemide (LASIX) 80 MG tablet Take 1 tablet (80 mg total) by mouth 2 (two) times daily. 180 tablet 3   magnesium oxide (MAG-OX) 400 MG tablet Take 1 tablet (400 mg total) by mouth daily. 90 tablet 1   sacubitril-valsartan (ENTRESTO) 49-51 MG Take 1 tablet by mouth 2 (two) times daily. 180 tablet 3   tiotropium (SPIRIVA HANDIHALER) 18 MCG inhalation capsule Place 1 capsule (18 mcg total) into inhaler and inhale daily. 30 capsule 6   traZODone (DESYREL) 50 MG tablet TAKE 1 TABLET (50 MG TOTAL) BY MOUTH AT BEDTIME AS NEEDED FOR SLEEP. 30 tablet 2   metoprolol succinate (TOPROL-XL) 100 MG 24 hr tablet Take 1 tablet (100 mg total) by mouth at bedtime. 90 tablet 3   spironolactone (ALDACTONE) 25 MG tablet Take 1 tablet (25 mg total) by mouth at bedtime. 90 tablet 3   No current facility-administered medications for this encounter.   BP 100/80   Pulse (!) 104   Wt 62.6 kg (138 lb)   SpO2 96%   BMI 20.98 kg/m  General: NAD Neck: No JVD, no thyromegaly or thyroid nodule.  Lungs: Clear to auscultation bilaterally with normal respiratory effort. CV: Nondisplaced PMI.  Heart regular S1/S2, no S3/S4, no murmur.  No peripheral edema.  No carotid bruit.  Normal pedal pulses.  Abdomen: Soft, nontender, no hepatosplenomegaly, no distention.  Skin: Intact without lesions or rashes.  Neurologic: Alert and oriented x 3.  Psych: Normal affect. Extremities: No clubbing or cyanosis.  HEENT: Normal.   Assessment/Plan: 1. Chronic systolic CHF: Nonischemic  cardiomyopathy.  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  Cause of CMP uncertain => prior cocaine abuse but not currently.  He drinks about 3 beers/day, denies heavier drinking in past but apparently there was concern that he had had ETOH withdrawal seizures in the past.  His grandmother had CHF but apparently none of his 13 siblings have known cardiac disease. EF has been low since at least 2016.  Most recent echo in 2/23 showed EF < 20%, severe LV dilation, moderately decreased RV systolic function with mild RV enlargement. He has a Secondary school teacher ICD.  On exam, he is not  volume overloaded.  NYHA class II-III symptoms.  - Continue Lasix 80 mg bid, check BMET/BNP today.  - Continue dapagliflozin 10 mg daily - Continue digoxin 0.125 daily, digoxin level today.  - Continue Toprol XL 100 mg daily (no BP room to increase).  - Continue Entresto 49/51 bid (no BP room to increase).  - Continue spironolactone 25 mg daily.  - I will arrange for cardiac MRI to look for infiltrative disease.  - I recommended that he cut back on ETOH.  - I will arrange for CPX.  - Evaluate for baroreceptor activation therapy.  2. COPD: Smokes about 1/4 ppd.  I encouraged him to quit.   Followup NP in 6 wks.   Marca Ancona. 09/08/2021

## 2021-09-08 NOTE — Addendum Note (Signed)
Encounter addended by: Laurey Morale, MD on: 09/08/2021 1:49 PM  Actions taken: Clinical Note Signed

## 2021-09-09 ENCOUNTER — Other Ambulatory Visit: Payer: Self-pay | Admitting: *Deleted

## 2021-09-09 DIAGNOSIS — Z006 Encounter for examination for normal comparison and control in clinical research program: Secondary | ICD-10-CM

## 2021-09-09 NOTE — Progress Notes (Signed)
Orders for screening entered for batwire

## 2021-09-10 ENCOUNTER — Other Ambulatory Visit: Payer: Self-pay | Admitting: *Deleted

## 2021-09-10 NOTE — Patient Instructions (Signed)
Visit Information  Mr. TOWNSEND CUDWORTH  - as a part of your Medicaid benefit, you are eligible for care management and care coordination services at no cost or copay. I was unable to reach you by phone today but would be happy to help you with your health related needs. Please feel free to call me @ [phone].   A member of the Managed Medicaid care management team will reach out to you again over the next 14 days.   [name] RN, BSN [place]  [facility]

## 2021-09-10 NOTE — Patient Outreach (Signed)
  Medicaid Managed Care   Unsuccessful Attempt Note   09/10/2021 Name: [name] MRN: [id number] DOB: Oct 27, 1965  Referred by: Hoy Register, MD Reason for referral : No chief complaint on file.   An unsuccessful telephone outreach was attempted today. The patient was referred to the case management team for assistance with care management and care coordination.    Follow Up Plan: A HIPAA compliant phone message was left for the patient providing contact information and requesting a return call.    [name] RN, BSN [place]  [facility]

## 2021-09-11 ENCOUNTER — Ambulatory Visit (HOSPITAL_BASED_OUTPATIENT_CLINIC_OR_DEPARTMENT_OTHER): Payer: Medicaid Other | Admitting: Family

## 2021-09-11 NOTE — Progress Notes (Signed)
Remote ICD transmission.   

## 2021-09-13 ENCOUNTER — Encounter: Payer: Medicaid Other | Admitting: Cardiology

## 2021-09-18 ENCOUNTER — Ambulatory Visit (HOSPITAL_COMMUNITY)
Admission: RE | Admit: 2021-09-18 | Discharge: 2021-09-18 | Disposition: A | Discharge status: Home / Self Care | Payer: Self-pay | Source: Ambulatory Visit | Attending: Cardiology | Admitting: Cardiology

## 2021-09-18 ENCOUNTER — Encounter: Payer: Medicaid Other | Admitting: *Deleted

## 2021-09-18 ENCOUNTER — Telehealth (HOSPITAL_COMMUNITY): Payer: Self-pay | Admitting: *Deleted

## 2021-09-18 ENCOUNTER — Ambulatory Visit (HOSPITAL_BASED_OUTPATIENT_CLINIC_OR_DEPARTMENT_OTHER)
Admission: RE | Admit: 2021-09-18 | Discharge: 2021-09-18 | Disposition: A | Discharge status: Home / Self Care | Payer: Self-pay | Source: Ambulatory Visit | Attending: Cardiology | Admitting: Cardiology

## 2021-09-18 ENCOUNTER — Other Ambulatory Visit (HOSPITAL_COMMUNITY): Payer: Self-pay

## 2021-09-18 VITALS — BP 84/50 | HR 74 | Ht 68.0 in | Wt 139.6 lb

## 2021-09-18 DIAGNOSIS — Z006 Encounter for examination for normal comparison and control in clinical research program: Secondary | ICD-10-CM

## 2021-09-18 MED ORDER — FUROSEMIDE 80 MG PO TABS
80.0000 mg | ORAL_TABLET | Freq: Every day | ORAL | 3 refills | Status: DC
Start: 2021-09-18 — End: 2021-09-27

## 2021-09-18 MED ORDER — ENTRESTO 49-51 MG PO TABS: 0.5000 | ORAL_TABLET | Freq: Two times a day (BID) | ORAL | 3 refills | Status: DC

## 2021-09-18 NOTE — Research (Signed)
[facility] screening  Section A:  Administrative Section  Subject ID: _[id number]_ - _032_ - 015 Subject Initials: _C_ _R_ _C_  Date subject signed informed consent  _30_/_AUG_/_2023__      (DD /  MMM      / YYYY)  Has the subject been previously screened?    '[x]'   No     '[]'    Yes                                                                                  Previous Subject ID   __ __ __ __ - __ __ __ - 015       Has the subject been randomized in the BeAT-HF Trial?    '[x]'   No     '[]'    Yes  Previous Subject ID   __ __ __ __ - __ __ __ - 013  Section B:  Enrollment Criteria  In the investigator's opinion does the subject meet the FDA Indication for Use:  '[x]'  Yes    '[]'  No (STOP - subject does not qualify for the study)          Has the subject received cardiac resynchronization therapy (CRT) within six months of enrollment, or is actively receiving CRT? '[]'  Yes (STOP - subject does not qualify for the study)   '[x]'  No  Section C:  Serum Biomarkers   NT-proBNP: ___1387__ Date:    _30_/_AUG_/_2023_                  (DD / MMM / [place])  eGFR:  _76_ mL/min/1.31m Date:    _30_/_AUG_/_2023_                  (DD / MMM / [place]  Negative Pregnancy Test? '[x]'  N/A Date:    _____/_______/_______                  (DD / MMM / [place]   '[]'  Yes     '[]'  No   Section D:  Appropriate Surgical/Study Candidate:   Is the subject able to discontinue the use of antiplatelet drugs (e.g. aspirin) in advance of the procedure, if required? '[x]'  Yes   '[]'  No  Assessed by:  _Annamarie Major MD__ implanting Physician '[x]'  Yes Date:    _07_/_Sep_/_2023__                 (DD / MMM / [place]   '[]'  No   Assessed by:   ________________________ Vascular / Other Surgeon '[]'  Yes Date:    _____/_______/_______                 (DD / MMM / [place]     '[x]'  No   Section E:  Inclusion/Exclusion Criteria  Does the subject meet all Inclusion Criteria? '[]'  Yes   '[x]'  No (STOP - subject does not qualify for the study)   Check all that apply    '[]'  Age at least 21 years and no more than 80 years at the time of enrollment.   '[x]'  Appropriate candidate for the surgery as determined by an evaluation from the implanting physician using a carotid duplex ultrasound (CDU) [or  Computed Tomography Angiography (CTA) if CDU inconclusive or incomplete] and a review of medical history (including existence of infections that may increase implant risk).  Evaluation must confirm the following within 45 days of the BAROSTIM NEO implant: Appropriate medical condition and medical history for implantation of the BAROSTIM NEO System AND Anatomy that enables this implant procedure, with no vascular structures or orientations or neck anomalies that would be obstructive to the implantation path AND The artery planned for the BAROSTIM NEO implant must have: A carotid bifurcation below the level of the mandible AND No ulcerative carotid arterial plaques AND No carotid atherosclerosis producing a 30% or greater stenosis in linear diameter in the internal carotid AND No carotid atherosclerosis producing a 30% or greater stenosis in linear diameter in the distal common carotid AND Have had no prior surgery, radiation, or endovascular stent placement in the carotid artery or the carotid sinus region AND Able to discontinue the use of antiplatelet drugs (e.g. aspirin) in advance of the procedure, if required   '[]'  Six-minute hall walk (6MHW) ? 150 m AND ? 400 m within 45 days prior to implant.   '[]'  Serum estimated glomerular filtration rate (eGFR) ? 25 mL/min/1.73 m2 using the CKD-EPI method within 45 days prior to the [facility] NEO implant.   '[]'  Body mass index ? 40 kg/m2 within 45 days prior to the [facility] NEO implant.   '[]'  If male and of childbearing potential, must use a medically accepted method of birth control (e.g., barrier method with spermicide, oral contraceptive, or abstinence) and agree to continue use of this method for the duration of the study. Women of  childbearing potential must have a negative pregnancy test within 14 days prior to the [facility] NEO implant.   '[]'  Subjects implanted with a cardiac rhythm management device that does not utilize an intracardiac lead, or implanted with a neurostimulation device, must be approved by the [place].   '[]'  At the end of screening/baseline, subject still meets the Barostim Neo Indication for Use   '[]'  Signed a [employer]-approved informed consent form for participation in this study.      Does the subject meet any Exclusion Criteria? '[]'  Yes (STOP - subject does not qualify for the study)   '[x]'  No     List criteria # met: __________________________   '[]'  Received cardiac resynchronization therapy (CRT) within six months of enrollment or is actively receiving CRT.   '[]'  Any of the following contraindications: Baroreflex failure or autonomic neuropathy  Uncontrolled, symptomatic cardiac bradyarrhythmias Known allergy to silicone or titanium   '[]'  Unstable ventricular arrhythmias.   '[]'  Presence of baseline cranial nerve dysfunction at risk from cervical interventions on the carotid bifurcation determined by the Ear, Nose and Throat (ENT) examination.   '[]'  Subjects with any surgery that has occurred, or is planned to occur, within 45 days of the [facility] NEO implant.   '[]'  Recent history (within 6 months of implant) of significant and uncontrolled bleeding.   '[]'  Known and untreated hypercoagulability state.   '[]'  An inappropriate study candidate as evidenced by: Solid organ or hematologic transplant, or currently being evaluated for an organ transplant. Has received or is receiving LVAD therapy or chronic dialysis. Current or planned treatment with intravenous positive inotrope therapy. Primary pulmonary hypertension. Severe COPD or severe restrictive lung disease (e.g. requires chronic oral steroid use or home oxygen use).  Heart failure secondary to a reversible cause, such as cardiac structural  valvular disease, acute myocarditis and pericardial  constriction. Clinically significant cardiac structural valvular disease.  Unable or unwilling to fulfill the protocol medication compliance and follow-up requirements, for reasons including but not limited to an unresolved history of alcohol or substance abuse or psychiatric disorder. Active malignancy.  Any other serious medical condition that may adversely affect the safety of the participant or validity of the study, in the opinion of the investigator. Life expectancy less than one year.   '[]'  Any of the following within 3 months prior to the [facility] NEO implant. Myocardial infarction Unstable angina Coronary intervention (e.g. CABG or PTCA)  Cerebral vascular accident or transient ischemic attack Sudden cardiac death  Surgical cardiac intervention (e.g. cardiac ablation, valve replacement)   '[]'  Enrolled and active in another (e.g. device, pharmaceutical, or biological) clinical study unless approved by the [employer] Clinical department.  Section F:  Adverse Events  Were there any Adverse Events that occurred since consent? '[]'  Yes (complete AE form)   '[x]'  No  Section H:  Medication Changes   Have there been any changes to the subject's home use medications for Arrhythmia, Antiplatelet/Anticoagulation and Heart failure medications during screening and baseline? '[]'  Yes (update Med form)   '[x]'  No  Section I:  Signature   Person completing form (Print Name): Philemon Kingdom :) ______  Signature: Philemon Kingdom :) ________ Date: _08/30/2023__updated 10/09/2023______     Section A:  Administrative Section  Subject ID: _[id number]_ - _032_ - 015 Subject Initials: _C_ _R_ _C_  Visit Interval:    '[x]'   Screening/Baseline    '[]'  Activation   '[]'  0.5 Month       '[]'  1 Month     '[]'  2 Month     '[]'  3 Month       '[]'  6 Month     '[]'  12 Month         '[]'  Unscheduled, reason for visit: _________________________________________  Section B:  Physical  Assessment   Date:    _30_/_AUG_/_2023_   (DD / MMM / YYYY)  Weight: _139.6_  '[]'  kg    '[x]'  pounds Height (Screening Visit Only): _173_  '[x]'  cm '[]'  inches  Blood Pressure: __84/50_mmHg Heart Rate: __74__ bpm  Section E:  Signature    Person completing form (Print Name): __Kimberly Adult nurse :) _______________  Signature: Philemon Kingdom :) _______ Date: _08/30/2023________________     SECTION A:  Administrative Section  Patient ID: _[id number]_ - __032_ - 015 Patient Initials: _C_ _R_ _C_  Visit Interval:    '[x]'   Screening/Baseline*    '[]'  Activation   '[]'  0.5 Month       '[]'  1 Month     '[]'  2 Month     '[]'  3 Month       '[]'  6 Month     '[]'  12 Month         '[]'  Unscheduled, reason for visit: _________________________________________  * For Screening / Baseline only the questions are based on 30 days prior to consent.  SECTION B:  COVID-19 Like Illness Symptoms   Has the subject experienced any cold, flu or COVID-19 symptoms since the last study visit? '[x]'   No  (skip to section C)     '[]'  Yes, date of onset:  _____/_____ MMM/YYYY    If yes, check all symptoms that apply:   '[]'  Fevers or chills   '[]'   New or Worsening Cough  '[]'  Productive  '[]'  Dry     If yes to cough, indicate severity  '[]'  Constant  '[]'  Occasional,  several per hour   '[]'  New or worsened shortness of breath   '[]'  Diarrhea   '[]'  Altered or reduced sense of smell or taste   '[]'  Muscle aches/Severe Fatigue   '[]'  Chest pain or tightness   '[]'  Sore throat   '[]'  Nausea or vomiting  SECTION C:  COVID-19 Like Illness Testing   Has the patient been tested for COVID-19 since the last study visit? '[x]'  No     '[]'  Yes, date of test:  _____/_____ MMM/YYYY    If yes, test results:   '[]'  Positive '[]'  Negative '[]'  Unknown  Has the patient been tested for COVID-19 Antibodies since the last study visit? '[x]'  No     '[]'  Yes, date of test:  _____/_____ MMM/YYYY    If yes, test results:   '[]'  Positive '[]'  Negative '[]'  Unknown  Has the patient been vaccinated  for COVID-19 since the last study visit? '[x]'  No     '[]'  Yes, date of test:  _____/_____ MMM/YYYY  Has the patient been tested for Influenza ("flu") since the last study visit? '[x]'  No     '[]'  Yes, date of test:  _____/_____ MMM/YYYY    If yes, test results:   '[]'  Positive '[]'  Negative '[]'  Unknown  Has the patient been vaccinated for Influenza ("flu") since the last study visit? '[x]'  No     '[]'  Yes, date of test:  _____/_____ MMM/YYYY  SECTION D:  COVID-19 Like Illness Exposure  Has the subject been told that they might have had COVID-19/have symptoms suggestive of COVID-19 since the last study visit? '[x]'    No   '[]'  Yes   '[]'   Unknown  Has the subject been exposed to anyone with known or suspected COVID-19 since the last study visit? '[x]'    No   '[]'  Yes   '[]'   Unknown  Has the subject been told that they might have had the "flu" or influenza since the last study visit? '[x]'    No   '[]'  Yes   '[]'   Unknown  SECTION E:  Effects of COVID Pandemic on [place] Interactions  Since the last study visit, did the subject feel the need to go to an emergency department or hospital for their heart failure but decided not to because of concerns about COVID-19? '[x]'   No   '[]'  Yes   '[]'  Unknown    If yes, how did they seek care (check all that apply):   '[]'  Telemedicine visit '[]'  In-person '[]'  Clinic '[]'  Urgent Care   '[]'  Subject did not change hospital/ER use due to COVID-19 '[]'  Other, specify: ________________________  Since the last study visit, did the subject have a cardiology/HF related appointment cancelled/rescheduled due to COVID-19 pandemic? '[x]'   No   '[]'  Yes, how many? ___________   '[]'  Unknown  Since the last study visit, did the subject have any cardiology/HF related telemedicine visit due to COVID-19 pandemic? '[x]'   No   '[]'  Yes, how many? ___________   '[]'  Unknown  Since the last study visit, did the subject have a cardiology/HF procedure cancelled/rescheduled due to COVID-19 pandemic? '[x]'   No   '[]'   Yes, how many? ___________   '[]'  Unknown  SECTION F:  Effects of COVID Pandemic on Subject's Medications  Since the last visit, did any of their heart failure medications change or stop, even for a short time?   If yes, enter change into medication eCRF '[x]'   No   '[]'  Yes, how many? ___________   '[]'   Unknown    If yes, why:   '[]'  Instructed by Doctor '[]'  Self-discontinued '[]'  Unknown '[]'  Other: ________________  Since the last visit, was the subject prescribed any medications for COVID-19? '[x]'   No   '[]'  Yes   '[]'  Unknown    If yes, what medications (generic name):  SECTION G:  Effects of COVID Pandemic on Subject's Lifestyle  How has the subject's activity/exercise level changed due to COVID-19 pandemic? '[x]'   No change   '[]'  More activity/exercise   '[]'  Less activity/exercise  How has the subject's smoking habits changed due to COVID-19? '[]'   Does not smoke   '[x]'  No change   '[]'  Smoke more   '[]'  Smoke less  How has the subject's alcohol drinking habits changed due to COVID-19? '[]'   Does not drink   '[x]'  No change   '[]'  Drink more   '[]'  Drink less  Section H:  Signature  Person completing form (Print Name): Philemon Kingdom :) ________________________  Signature: Philemon Kingdom :) _______ Date: __08/30/2023____________________    Section A:  Administrative Section  Subject ID: _[id number]_ - _032_ - 015 Subject Initials: _C_ _R_ _C_ Visit Interval:  '[x]'   Baseline     '[]'  6 Month  Section B:  NYHA   NYHA Classification Date:    _30_/_AUG_/_2023_  (DD / MMM / [place])  Select One Class Subject Symptoms  '[]'  I No limitations of physical activity, No undue fatigue, palpitation or dyspnea  '[]'  II Slight limitation of physical activity, Comfortable at rest, Less than ordinary activity results in fatigue, Palpitation, or dyspnea  '[x]'  III Marked limitation of physical activity, Comfortable at rest, Less than ordinary activity results in fatigue, palpitation, or dyspnea  '[]'  IV Unable to carry out any  physical activity without discomfort, Symptoms of cardiac insufficiency at rest, Physical activity causes increased discomfort  Name of person conducting NYHA: Philemon Kingdom :) _______________  Section C:  6MHW   Six Minute Hall Walk Test Date:    _30_/_AUG_/_2023_  (DD / MMM / [place])  Distance Walked __274_ meters  Were there any devices used to assist the subject in walking (e.g. cane, walker, etc.)? '[x]'  No   '[]'  Yes specify:_______________________  Was the walk terminated before 6 minutes? '[x]'  No   '[]'  Yes specify reason: (select all that apply)    '[]'  Angina    '[]'   Dyspnea    '[]'   Fatigue    '[]'   Dizziness    '[]'   Syncope    '[]'   Other, specify:  Name of person conducting 6-Minute Hall Walk: __Kimberly Alba Destine :) ________  Section D:  Signature   Person completing form (Print Name): Philemon Kingdom :) ________________  Signature: Philemon Kingdom :) _____ Date: __08/30/2023________________      Section A:  Administrative Section   Subject ID: _[id number]_ - __032_ - 015  Subject Initials: _C_ _R_ _C_  Section B:  Carotid Duplex Ultrasound (CDU) '[x]'   Done '[]'  Not Done  Date: __30_/_AUG_/_2023_ (DD / MMM / [place])  '[x]'  Images sent to [facility]  % atherosclerosis in the internal and distal common carotid and duplex measurements:  Right Side Measurements    Both internal and distal common carotids are less than 30% '[x]'  Yes  '[]'  No (cannot be used for implant)   Comments on determination of <30% stenosis:  Internal Carotid (ICA) '[]'   Normal (no plaque) PSV (peak systolic velocity) _16_ cm/sec   '[x]'   ?15% (*see CTA) EDV (end diastolic velocity) _10_ cm/sec   '[]'   16-49% (*see CTA) Linear diameter _46_ mm   '[]'   50-69% Normal spectral waveform  '[x]'  Yes   '[]'   ?70%  '[]'  No, specify pattern: __________________  Distal Common Carotid [facility]) '[]'   Normal (no plaque) PSV (peak systolic velocity) _09_ cm/sec   '[x]'   ?15% (*see CTA) EDV (end diastolic velocity) _73_ cm/sec   '[]'   16-49% (*see CTA)  Linear diameter _75_ mm   '[]'   50-69% Normal spectral waveform  '[x]'  Yes   '[]'   ?70%  '[]'  No, specify pattern: __________________  Left Side Measurements   Both internal and distal common carotids are less than 30% '[x]'  Yes  '[]'  No (cannot be used for implant)   Comments on determination of <30% stenosis:  Internal Carotid (ICA)  '[]'   Stenosis Normal (no plaque) PSV (peak systolic velocity) __53__ cm/sec   '[x]'   ?15% (*see CTA) EDV (end diastolic velocity) __29__ cm/sec   '[]'   16-49% (*see CTA) Linear diameter __46__ mm   '[]'   50-69%  Normal spectral waveform  '[x]'  Yes   '[]'   ?70%   '[]'  No, specify pattern: __________________  Distal Common Carotid [facility])  '[]'   Stenosis Normal (no plaque) PSV (peak systolic velocity) _92__ cm/sec   '[x]'   ?15% (*see CTA) EDV (end diastolic velocity) _42__ cm/sec   '[]'   16-49% (*see CTA) Linear diameter _57_ mm   '[]'   50-69%  Normal spectral waveform  '[x]'  Yes   '[]'   ?70%   '[]'  No, specify pattern: __________________  Name of person reading CDU:  Orlie Pollen MD   Section C:  Computed Tomography Angiogram (CTA) (if required) '[]'  Done '[x]'  Not Done  '[]'  CDU was inconclusive (e.g. *stenosis greater than 0%) Date: _____/_______/_______  (DD / MMM / [place])  '[]'  Images sent to [facility]  '[]'  CDU was incomplete/not done    '[]'  CTA requested by [facility]    % atherosclerosis Internal Carotid (ICA) Distal Common Carotid [facility])  Right Side _______% _______%  Left Side _______% _______%  Name of person reading CTA:   Section D:  Physician Assessment  Are there any vascular structures or orientations or neck anomalies that would be obstructive to the implantation path? '[x]'  No '[]'  Yes (Specify side):     '[]'  Left  '[]'  Right '[]'  Both  Has the subject had prior surgery, radiation, or endovascular stent placement in the carotid artery or the carotid sinus region? '[x]'  No '[]'  Yes (Specify side):     '[]'  Left  '[]'  Right '[]'  Both  Is at least one carotid bifurcation below the level of the mandible? '[x]'  No  '[]'   Yes (Specify side):     '[]'  Left  '[]'  Right '[]'  Both  Are there any ulcerative carotid arterial plaques? '[x]'  No  '[]'  Yes (Specify side):     '[]'  Left  '[]'  Right '[]'  Both  Is this subject a candidate for the BATwire procedure? '[x]'  No  '[]'  Yes (Specify side):     '[]'  Left  '[]'  Right '[]'  Both  Section E:  Comments  N/a    Section F:  Signature   Person completing form (Print Name): __Kimberly Alba Destine :) _____________  Signature: Philemon Kingdom :) _______ Date: ___10/09/2023_______________________   Section A:  Administrative Section  Subject ID: _[id number]_ - _032_ - 015 Subject Initials: _C_ _R_ _C_ Visit Interval:  '[x]'   Baseline     '[]'  6 Month     '[]'  12 Month  Section B:  Cardiac Echo   Cardiac Echo Date:  _30_/_AUG_/_2023_ DD / MMM / [place])  '[x]'  Images sent to [facility]  Heart rate ___55_______ bpm Echo type: '[x]'  2D Images Captured: (4-Chamber and Biplane required) '[x]'  PS-LAX  Blood Pressure _100__/_80__ mmHg  '[]'  3D  '[x]'  Biplane        '[x]'  Apical 4-Chamber        '[]'  Other, specify:_______   LV End Diastolic Dimension ____76_________ mm   LV End Systolic Dimension ____28_________ mm   LA Volume ____31________ mL/m2   LV End-Diastolic Volume ____315________ mL   LV End-Systolic Volume ____176________ mL   LV Ejection Fraction _____20_______ %  Section C:  Comments  N/A    Section C:  Signature   Person completing form (Print Name): __Kimberly Trinity :) __________  Signature: Philemon Kingdom :) ____ Date: __11/28/2023________________________       Current Outpatient Medications:    albuterol (PROAIR HFA) 108 (90 Base) MCG/ACT inhaler, Inhale 2 puffs into the lungs every 6 (six) hours as needed for shortness of breath., Disp: 18 g, Rfl: 6   aspirin 81 MG EC tablet, Take 81 mg by mouth daily. Swallow whole., Disp: , Rfl:    dapagliflozin propanediol (FARXIGA) 10 MG TABS tablet, Take 1 tablet (10 mg total) by mouth daily before breakfast., Disp: 90 tablet, Rfl: 3   digoxin  (LANOXIN) 0.125 MG tablet, Take 1 tablet (0.125 mg total) by mouth daily., Disp: 30 tablet, Rfl: 11   fluticasone-salmeterol (ADVAIR DISKUS) 250-50 MCG/ACT AEPB, Inhale 1 puff into the lungs in the morning and at bedtime., Disp: 1 each, Rfl: 3   furosemide (LASIX) 80 MG tablet, Take 1 tablet (80 mg total) by mouth 2 (two) times daily., Disp: 180 tablet, Rfl: 3   metoprolol succinate (TOPROL-XL) 100 MG 24 hr tablet, Take 1 tablet (100 mg total) by mouth at bedtime., Disp: 90 tablet, Rfl: 3   sacubitril-valsartan (ENTRESTO) 49-51 MG, Take 1 tablet by mouth 2 (two) times daily., Disp: 180 tablet, Rfl: 3   spironolactone (ALDACTONE) 25 MG tablet, Take 1 tablet (25 mg total) by mouth at bedtime., Disp: 90 tablet, Rfl: 3   tiotropium (SPIRIVA HANDIHALER) 18 MCG inhalation capsule, Place 1 capsule (18 mcg total) into inhaler and inhale daily., Disp: 30 capsule, Rfl: 6   traZODone (DESYREL) 50 MG tablet, TAKE 1 TABLET (50 MG TOTAL) BY MOUTH AT BEDTIME AS NEEDED FOR SLEEP., Disp: 30 tablet, Rfl: 2   cetirizine (ZYRTEC) 10 MG tablet, TAKE 1 TABLET (10 MG TOTAL) BY MOUTH DAILY. (Patient not taking: Reported on 09/18/2021), Disp: 30 tablet, Rfl: 0   magnesium oxide (MAG-OX) 400 MG tablet, Take 1 tablet (400 mg total) by mouth daily. (Patient not taking: Reported on 09/18/2021), Disp: 90 tablet, Rfl: 1

## 2021-09-18 NOTE — Telephone Encounter (Signed)
Pt in for Research visit, BP low at 84/50, c/o dizziness with bending over, has not take any morning meds yet  Per Dr Shirlee Latch hold all meds today, restart on tomorrow [name] 8/31) with following changes:   Decrease Entresto to 24/26 mg BID  Decrease Lasix to 80 mg DAILY   Pt brought meds to appt, they are in bubble pack, he has been continuing on Toprol and Spiro in AM, instead of PM as recommended at [facility] 8/18.  Involved Maralyn Sago, EMT to set up new pill box for pt with correct med doses/directions. Referral placed for paramedicine to follow him in community to assist with further med management  Pt scheduled for f/u next week (09/26/21)

## 2021-09-18 NOTE — Progress Notes (Signed)
Paramedicine Encounter    Patient ID: DAVIONNE DOWTY, male    DOB: October 15, 1965, 56 y.o.   MRN: [id number]  Met with Mr. Smallman at the request of Joelene Millin RN and Dr. Aundra Dubin for paramedicine med assistance.  Mr. Weathers brought in bubble packs from [facility] who packages his medications 4 weeks at a time. Today they reviewed his medications and they were inaccurate. Joelene Millin RN requested help with correcting meds.   I was introduced to him and he was agreeable to paramedicine.    I was able to verify meds and fill a pill box with correct meds based off Dr. Aundra Dubin orders today.  -HOLD MEDS TODAY -Lasix 54m once daily.  -Entresto 24/26 BID -Move spironolactone and metoprolol to bedtime.   I was able to fill pill box up until next Tuesday- I will plan to go out later today or tomorrow to place remainder of medications in pill box and plan to see him in one week in the clinic for follow up.   Clinic visit complete for today.   HSalena Saner EMT-Paramedic [phone]/30/2023     ACTION: Next visit planned for one week

## 2021-09-18 NOTE — Addendum Note (Signed)
Addended by: Noralee Space on: 09/18/2021 02:40 PM   Modules accepted: Orders

## 2021-09-19 ENCOUNTER — Telehealth (HOSPITAL_COMMUNITY): Payer: Self-pay

## 2021-09-19 ENCOUNTER — Other Ambulatory Visit (HOSPITAL_COMMUNITY): Payer: Self-pay

## 2021-09-19 LAB — COMPREHENSIVE METABOLIC PANEL
ALT: 10 IU/L (ref 0–44)
AST: 23 IU/L (ref 0–40)
Albumin/Globulin Ratio: 2 (ref 1.2–2.2)
Albumin: 4.5 g/dL (ref 3.8–4.9)
Alkaline Phosphatase: 67 IU/L (ref 44–121)
BUN/Creatinine Ratio: 19 (ref 9–20)
BUN: 22 mg/dL (ref 6–24)
Bilirubin Total: 0.5 mg/dL (ref 0.0–1.2)
CO2: 18 mmol/L — ABNORMAL LOW (ref 20–29)
Calcium: 9.8 mg/dL (ref 8.7–10.2)
Chloride: 100 mmol/L (ref 96–106)
Creatinine, Ser: 1.13 mg/dL (ref 0.76–1.27)
Globulin, Total: 2.2 g/dL (ref 1.5–4.5)
Glucose: 87 mg/dL (ref 70–99)
Potassium: 3.9 mmol/L (ref 3.5–5.2)
Sodium: 133 mmol/L — ABNORMAL LOW (ref 134–144)
Total Protein: 6.7 g/dL (ref 6.0–8.5)
eGFR: 76 mL/min/{1.73_m2} (ref >59)

## 2021-09-19 LAB — PRO B NATRIURETIC PEPTIDE: NT-Pro BNP: 1387 pg/mL — ABNORMAL HIGH (ref 0–210)

## 2021-09-19 NOTE — Progress Notes (Signed)
Batwire screening #032 please review

## 2021-09-19 NOTE — Telephone Encounter (Addendum)
Pt aware, agreeable, and verbalized understanding   ----- Message from Laurey Morale, MD sent at 09/19/2021 12:11 AM EDT ----- Mild nonobstructive carotid disease.

## 2021-09-19 NOTE — Progress Notes (Signed)
Paramedicine Encounter    Patient ID: JAVEL HERSH, male    DOB: 11/07/1965, 56 y.o.   MRN: [id number]  Micah Flesher out to visit Mr. Hughett in the home today and completed a med rec for him to complete filling his pill box. All meds were verified and confirmed.   Social/medical barriers or concerns:  PHARMACY USED [facility] ([facility])  PCP Dr. Alvis Lemmings    INSURANCE [place] Medicaid [facility]   MED ISSUES:  AFFORDABILITY Copays waived at [facility]   PT ASSIST APPS NEEDED None at present   SOURCE OF INCOME SSI $1200   TRANSPORTATION [facility] Medicaid   FOOD INSECURITIES/NEEDS- says he would benefit from pantry needs   FOOD STAMPS only gets $100 monthly   REVIEWED DIET/FLUID/SALT RESTRICTIONS- YES  RENT/OWN HOME ISSUES- [facility]   SOCIAL SUPPORT- Lives with Brother anda girlfriend   SAFETY/DOMESTIC ISSUES- None that I can see as of now   SUBSTANCE ABUSE- unsure   DAILY WEIGHTS- needs a  scale   EDUCATE ON DISEASE PROCESS/SYMPTOMS/PURPOSES OF MEDS- Yes   Uses [employer]- $50/ month    ACTION: Home visit completed

## 2021-09-24 ENCOUNTER — Other Ambulatory Visit: Payer: Self-pay | Admitting: *Deleted

## 2021-09-24 NOTE — Patient Instructions (Signed)
Visit Information  [name] was given information about Medicaid Managed Care team care coordination services as a part of their Healthy [facility] Medicaid benefit. [name] verbally consented to engagement with the [facility] Managed Care team.   If you are experiencing a medical emergency, please call 911 or report to your local emergency department or urgent care.   If you have a non-emergency medical problem during routine business hours, please contact your provider's office and ask to speak with a nurse.   For questions related to your Healthy [facility] health plan, please call: [phone] or visit the homepage here: [email or web address]  If you would like to schedule transportation through your Healthy [facility] plan, please call the following number at least 2 days in advance of your appointment: [phone]  For information about your ride after you set it up, call Ride Assist at [phone]. Use this number to activate a Will Call pickup, or if your transportation is late for a scheduled pickup. Use this number, too, if you need to make a change or cancel a previously scheduled reservation.  If you need transportation services right away, call [phone]. The after-hours call center is staffed 24 hours to handle ride assistance and urgent reservation requests (including discharges) 365 days a year. Urgent trips include sick visits, hospital discharge requests and life-sustaining treatment.  Call the [facility] Line at [phone], at any time, 24 hours a day, 7 days a week. If you are in danger or need immediate medical attention call 911.  If you would like help to quit smoking, call 1-800-QUIT-NOW ((360)729-0528) OR Espaol: 1-855-Djelo-Ya (9-735-329-9242) o para ms informacin haga clic aqu or Text READY to 683-419 to register via text  [name],   Please see education materials related to HF provided as  print materials.   The patient verbalized understanding of instructions, educational materials, and care plan provided today and agreed to receive a mailed copy of patient instructions, educational materials, and care plan.   Telephone follow up appointment with Managed Medicaid care management team member scheduled for:10/25/21 @ 11:15am  [name] RN, BSN [place]  [facility] RN Care Coordinator   Following is a copy of your plan of care:  Care Plan : RN Care Manager Plan of Care  Updates made by Heidi Dach, RN since 09/24/2021 12:00 AM     Problem: Health Management needs related to CHF      Long-Range Goal: Development of Plan of Care to address Health Management needs related to CHF   Start Date: 05/16/2021  Expected End Date: 08/14/2021  Priority: High  Note:   Current Barriers:  Chronic Disease Management support and education needs related to CHF [name] was seen in HF Clinic on 8/18, patient now participating in research study and recieving Paramedicine visits for managing his medications. Reports low BP 80/50 and decreased appetite, can go 3-4 days without eating.  RNCM Clinical Goal(s):  Patient will verbalize understanding of plan for management of CHF as evidenced by patient reports take all medications exactly as prescribed and will call provider for medication related questions as evidenced by patient reports and documentation in EMR    attend all scheduled medical appointments: 9/6 for Lab/Imaging, 9/7 for Stress Test, 9/11 for Barostem and 9/29 with Heart and Vascular as evidenced by provider documentation in EMR        work with community resource care guide to address needs related to Limited access to food and Level  of care concerns as evidenced by patient and/or community resource care guide support    through collaboration with Medical illustrator, provider, and care team.   Interventions: Inter-disciplinary care team collaboration (see  longitudinal plan of care) Evaluation of current treatment plan related to  self management and patient's adherence to plan as established by provider Provided the information to [facility] [phone] not had a chance to schedule eye exam  Provided patient with [facility] [phone], call to reschedule missed appointment   Heart Failure Interventions:  (Status: Goal on Track (progressing): YES.)  Long Term Goal  Wt Readings from Last 3 Encounters:  09/18/21 139 lb 9.6 oz (63.3 kg)  09/06/21 138 lb (62.6 kg)  07/04/21 142 lb 3.2 oz (64.5 kg)   Reviewed role of diuretics in prevention of fluid overload and management of heart failure Discussed the importance of keeping all appointments with provider Provided patient with education about the role of exercise in the management of heart failure Advised patient to discuss any concerns or questions with provider Assessed social determinant of health barriers Discussed the importance of smoking and alcohol cessation, education provided/mailed Reviewed upcoming appointments, patient aware and has transportation arranged Advised patient to contact provider with BP readings and discuss decreased appetite Encouraged patient to have 3 small meals a day, including fresh fruits and vegetables  Patient Goals/Self-Care Activities: Take medications as prescribed   Attend all scheduled provider appointments Call pharmacy for medication refills 3-7 days in advance of running out of medications Call provider office for new concerns or questions  use salt in moderation watch for swelling in feet, ankles and legs every day eat more whole grains, fruits and vegetables, lean meats and healthy fats

## 2021-09-24 NOTE — Patient Outreach (Signed)
Medicaid Managed Care   Nurse Care Manager Note  09/24/2021 Name:  [name] MRN:  [id number] DOB:  10-18-1965  [name] is an 56 y.o. year old male who is a primary patient of Hoy Register, MD.  The [facility] Managed Care Coordination team was consulted for assistance with:    CHF  [name] was given information about Medicaid Managed Care Coordination team services today. [name] Patient agreed to services and verbal consent obtained.  Engaged with patient by telephone for follow up visit in response to provider referral for case management and/or care coordination services.   Assessments/Interventions:  Review of past medical history, allergies, medications, health status, including review of consultants reports, laboratory and other test data, was performed as part of comprehensive evaluation and provision of chronic care management services.  SDOH (Social Determinants of Health) assessments and interventions performed: SDOH Interventions    Flowsheet Row Telephone from 05/31/2021 in [facility] Community Care Coordination Patient Outreach Telephone from 05/16/2021 in [facility] [facility] Care Coordination Office Visit from 06/07/2019 in [facility] Office Visit from 04/19/2015 in [facility]  SDOH Interventions      Food Insecurity Interventions Intervention Not Indicated  [Says food is ok with some numbers to food banks] Other (Comment)  [Care Guide referral for list of local food pantries] -- --  Housing Interventions -- Intervention Not Indicated -- --  Transportation Interventions Other (Comment)  [patient recertified medicaid so there is no transportation issue as he can use the medicaid transportation and knows how too] Other (Comment)  [Provided with HB transportation] -- --  Depression Interventions/Treatment  -- -- Counseling Medication, Currently on Treatment        Care Plan  Allergies  Allergen Reactions   Penicillins Other (See Comments)    Blisters  Has patient had a PCN reaction causing immediate rash, facial/tongue/throat swelling, SOB or lightheadedness with hypotension: Yes Has patient had a PCN reaction causing severe rash involving mucus membranes or skin necrosis: Yes Has patient had a PCN reaction that required hospitalization: No Has patient had a PCN reaction occurring within the last 10 years: No If all of the above answers are "NO", then may proceed with Cephalosporin use.     Medications Reviewed Today     Reviewed by Heidi Dach, RN (Registered Nurse) on 09/24/21 at 1239  Med List Status: <None>   Medication Order Taking? Sig Documenting Provider Last Dose Status Informant  albuterol (PROAIR HFA) 108 (90 Base) MCG/ACT inhaler [id number] No Inhale 2 puffs into the lungs every 6 (six) hours as needed for shortness of breath. Hoy Register, MD Taking Active Self  aspirin 81 MG EC tablet [id number] No Take 81 mg by mouth daily. Swallow whole. [provider] Taking Active Self  cetirizine (ZYRTEC) 10 MG tablet [id number] No TAKE 1 TABLET (10 MG TOTAL) BY MOUTH DAILY.  Patient not taking: Reported on 09/18/2021   Hoy Register, MD Not Taking Active Self  dapagliflozin propanediol (FARXIGA) 10 MG TABS tablet [id number] No Take 1 tablet (10 mg total) by mouth daily before breakfast. Alver Sorrow, NP Taking Active   digoxin (LANOXIN) 0.125 MG tablet [id number] No Take 1 tablet (0.125 mg total) by mouth daily. [name], Zachary George, NP Taking Expired 09/19/21 2359   fluticasone-salmeterol (ADVAIR DISKUS) 250-50 MCG/ACT AEPB [id number] No Inhale 1 puff into the lungs in the morning and at bedtime. Hoy Register, MD Taking Active  furosemide (LASIX) 80 MG tablet [id number] No Take 1 tablet (80 mg total) by mouth daily. Laurey Morale, MD Taking Active   magnesium oxide (MAG-OX) 400 MG tablet [id number] No Take 1 tablet  (400 mg total) by mouth daily.  Patient not taking: Reported on 09/18/2021   Sheilah Pigeon, PA-C Not Taking Active   metoprolol succinate (TOPROL-XL) 100 MG 24 hr tablet [id number] No Take 1 tablet (100 mg total) by mouth at bedtime. Laurey Morale, MD Taking Active   sacubitril-valsartan [facility]) 49-51 MG [id number] No Take 0.5 tablets by mouth 2 (two) times daily. Laurey Morale, MD Taking Active   spironolactone (ALDACTONE) 25 MG tablet [id number] No Take 1 tablet (25 mg total) by mouth at bedtime. Laurey Morale, MD Taking Active   tiotropium [facility]) 18 MCG inhalation capsule [id number] No Place 1 capsule (18 mcg total) into inhaler and inhale daily. Hoy Register, MD Taking Active   traZODone (DESYREL) 50 MG tablet [id number] No TAKE 1 TABLET (50 MG TOTAL) BY MOUTH AT BEDTIME AS NEEDED FOR SLEEP. 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   COPD (chronic obstructive pulmonary disease) (HCC) 12/09/2017   Obstructive sleep apnea 09/08/2016   Autonomic dysfunction 07/15/2015   DJD (degenerative joint disease), lumbar 06/19/2015   Tremor 05/14/2015   Abnormality of gait 05/14/2015   Neurogenic bladder 04/17/2015   Lumbar strain 03/23/2015   Leg weakness 03/23/2015   Major depression, chronic    History of stroke 02/26/2015   Benign essential HTN    ETOH abuse    Tobacco abuse    Chronic combined systolic and diastolic heart failure (HCC)    Alcoholic cardiomyopathy (HCC) 27/06/2374   Smoking greater than 20 pack years 04/19/2014   Polysubstance abuse (HCC) 03/22/2014     Conditions to be addressed/monitored per PCP order:  CHF  Care Plan : RN Care Manager Plan of Care  Updates made by Heidi Dach, RN since 09/24/2021 12:00 AM     Problem: Health Management needs related to CHF      Long-Range Goal: Development of Plan of Care to address Health Management needs related to CHF   Start Date: 05/16/2021  Expected End Date: 08/14/2021  Priority: High  Note:   Current Barriers:  Chronic Disease Management support and education needs related to CHF [name] was seen in HF Clinic on 8/18, patient now participating in research study and recieving Paramedicine visits for managing his medications. Reports low BP 80/50 and decreased appetite, can go 3-4 days without eating.  RNCM Clinical Goal(s):  Patient will verbalize understanding of plan for management of CHF as evidenced by patient reports take all medications exactly as prescribed and will call provider for medication related questions as evidenced by patient reports and documentation in EMR    attend all scheduled medical appointments: 9/6 for Lab/Imaging, 9/7 for Stress Test, 9/11 for Barostem and 9/29 with Heart and Vascular as evidenced by provider documentation in EMR        work with community resource care guide to address needs related to Limited access  to food and Level of care concerns as evidenced by patient and/or community resource care guide support    through collaboration with Consulting civil engineer, provider, and care team.   Interventions: Inter-disciplinary care team collaboration (see longitudinal plan of care) Evaluation of current treatment plan related to  self management and patient's adherence to plan as established by provider Provided the information to [place] not had a chance to schedule eye exam  Provided patient with [facility] [phone], call to reschedule missed appointment   Heart Failure Interventions:  (Status: Goal on Track (progressing):  YES.)  Long Term Goal  Wt Readings from Last 3 Encounters:  09/18/21 139 lb 9.6 oz (63.3 kg)  09/06/21 138 lb (62.6 kg)  07/04/21 142 lb 3.2 oz (64.5 kg)   Reviewed role of diuretics in prevention of fluid overload and management of heart failure Discussed the importance of keeping all appointments with provider Provided patient with education about the role of exercise in the management of heart failure Advised patient to discuss any concerns or questions with provider Assessed social determinant of health barriers Discussed the importance of smoking and alcohol cessation, education provided/mailed Reviewed upcoming appointments, patient aware and has transportation arranged Advised patient to contact provider with BP readings and discuss decreased appetite Encouraged patient to have 3 small meals a day, including fresh fruits and vegetables  Patient Goals/Self-Care Activities: Take medications as prescribed   Attend all scheduled provider appointments Call pharmacy for medication refills 3-7 days in advance of running out of medications Call provider office for new concerns or questions  use salt in moderation watch for swelling in feet, ankles and legs every day eat more whole grains, fruits and vegetables, lean meats and healthy fats       Follow Up:  Patient agrees to Care Plan and Follow-up.  Plan: The Managed Medicaid care management team will reach out to the patient again over the next 30 days.  Date/time of next scheduled RN care management/care coordination outreach:  10/25/21 @ 11:15am  [name] RN, BSN [place]  [employer] manager

## 2021-09-25 ENCOUNTER — Ambulatory Visit (HOSPITAL_COMMUNITY): Payer: Medicaid Other | Attending: Cardiology

## 2021-09-25 ENCOUNTER — Other Ambulatory Visit (HOSPITAL_COMMUNITY): Payer: Self-pay | Admitting: *Deleted

## 2021-09-25 DIAGNOSIS — J449 Chronic obstructive pulmonary disease, unspecified: Secondary | ICD-10-CM | POA: Insufficient documentation

## 2021-09-25 DIAGNOSIS — Z95810 Presence of automatic (implantable) cardiac defibrillator: Secondary | ICD-10-CM | POA: Diagnosis not present

## 2021-09-25 DIAGNOSIS — I5042 Chronic combined systolic (congestive) and diastolic (congestive) heart failure: Secondary | ICD-10-CM | POA: Insufficient documentation

## 2021-09-25 DIAGNOSIS — I471 Supraventricular tachycardia: Secondary | ICD-10-CM | POA: Diagnosis not present

## 2021-09-25 DIAGNOSIS — Z8673 Personal history of transient ischemic attack (TIA), and cerebral infarction without residual deficits: Secondary | ICD-10-CM | POA: Diagnosis not present

## 2021-09-25 DIAGNOSIS — G4733 Obstructive sleep apnea (adult) (pediatric): Secondary | ICD-10-CM | POA: Insufficient documentation

## 2021-09-25 DIAGNOSIS — Z79899 Other long term (current) drug therapy: Secondary | ICD-10-CM | POA: Diagnosis not present

## 2021-09-25 NOTE — Progress Notes (Addendum)
PCP: Default, Provider, MD HF Cardiology: Dr. Shirlee Latch  56 y.o. with history of nonischemic cardiomyopathy was referred by Eligha Bridegroom for evaluation of CHF.  Cardiomyopathy has been diagnosed since 2016 when echo showed EF 20%.  He has a history of cocaine use and ETOH abuse. Currently, drinking about 3 beers/day.  He is smoking.  No recent cocaine.  He had a stroke in 2017 in setting of uncontrolled HTN and cocaine abuse.  He has a Secondary school teacher ICD.  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  Last echo in 2/23 showed EF < 20%, severe LV dilation, moderately decreased RV systolic function with mild RV enlargement. He has 13 siblings, none of whom have known cardiac disease.  His grandmother had CHF and there apparently was conversation about a heart transplant but she passed away.   Follow up 10/08/2022 with Dr. Shirlee Latch, NYHA II-III, volume stable. CPX, cMRI and evaluation for BAT arranged.   CPX (9/23) showed severe functional limitation due to HF and deconditioning.   Today he returns for HF follow up with paramedic Heather. Overall feeling fair. He is short of breath with minimal activity and has dizziness. Has more leg cramping. He has occasional chest tightness. + bendopnea and palpitations. Denies abnormal bleeding, edema, or PND. He sleeps on 6 pillows.  Appetite poor. No fever or chills. He does not have a scale at home, paramedic working on this. Taking all medications. Smokes 5-6 cigs/day, 4 16 oz beers/day. No cocaine in 7 years. Mom, sister, daughter and son are local. 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  Device interrogation (personally reviewed): CorVue stable, 0% v pacing, no VT  ECG (personally reviewed): none ordered today.  Labs (6/23): hgb 15.6, BNP 673, K 4.2, creatinine 1.23 Labs 08-Oct-2022): K 3.9, creatinine 1.13  PMH: 1. H/o seizure disorder: ?ETOH. 2. HTN 3. 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  4. H/o SVT 5. OSA 6. COPD: Active  smoker 7. VT: St Jude ICD.  ATP 6/23.  8. Chronic systolic CHF: Nonischemic cardiomyopathy.  Documented since at least 2016. St Jude ICD.  - Echo (3/16): EF 20% - Echo (2/23): EF < 20%, severe LV dilation, moderately decreased RV systolic function with mild RV enlargement.  - Coronary CTA (6/23): 81st percentile calcium score, mild nonobstructive CAD.  - CPX (9/23): Peak VO2: 14.3 (43% predicted peak VO2), VE/VCO2 slope: 39, OUES: 1.10, Peak RER: 0.70  9. Prior h/o cocaine  Social History   Socioeconomic History   Marital status: Single    Spouse name: Not on file   Number of children: 1   Years of education: 12   Highest education level: Not on file  Occupational History   Not on file  Tobacco Use   Smoking status: Every Day    Packs/day: 1.00    Years: 20.00    Total pack years: 20.00    Types: Cigarettes   Smokeless tobacco: Never   Tobacco comments:    05/14/16 smoking 1 PPD  Vaping Use   Vaping Use: Never used  Substance and Sexual Activity   Alcohol use: Yes    Alcohol/week: 1.0 standard drink of alcohol    Types: 1 Cans of beer per week    Comment: socially    Drug use: Not Currently    Types: Cocaine    Comment: stopped using cocaine 11/19/14   Sexual activity: Not on file  Other Topics Concern   Not on file  Social History  Narrative   Lives alone, nurse comes 2 hrs daily   Social Determinants of Health   Financial Resource Strain: Not on file  Food Insecurity: Food Insecurity Present (05/31/2021)   Hunger Vital Sign    Worried About Running Out of Food in the Last Year: Often true    Ran Out of Food in the Last Year: Sometimes true  Transportation Needs: Unmet Transportation Needs (05/31/2021)   PRAPARE - Hydrologist (Medical): Yes    Lack of Transportation (Non-Medical): Yes  Physical Activity: Not on file  Stress: Not on file  Social Connections: Not on file  Intimate Partner Violence: Not on file   Family History   Problem Relation Age of Onset   Hypertension Mother    Hyperlipidemia Mother    Hypertension Father    Stroke Maternal Aunt    Hypertension Sister    Hypertension Brother    ROS: All systems reviewed and negative except as per HPI.   Current Outpatient Medications  Medication Sig Dispense Refill   albuterol (PROAIR HFA) 108 (90 Base) MCG/ACT inhaler Inhale 2 puffs into the lungs every 6 (six) hours as needed for shortness of breath. 18 g 6   aspirin 81 MG EC tablet Take 81 mg by mouth daily. Swallow whole.     cetirizine (ZYRTEC) 10 MG tablet TAKE 1 TABLET (10 MG TOTAL) BY MOUTH DAILY. 30 tablet 0   dapagliflozin propanediol (FARXIGA) 10 MG TABS tablet Take 1 tablet (10 mg total) by mouth daily before breakfast. 90 tablet 3   digoxin (LANOXIN) 0.125 MG tablet Take 1 tablet (0.125 mg total) by mouth daily. 30 tablet 11   fluticasone-salmeterol (ADVAIR DISKUS) 250-50 MCG/ACT AEPB Inhale 1 puff into the lungs in the morning and at bedtime. 1 each 3   furosemide (LASIX) 80 MG tablet Take 1 tablet (80 mg total) by mouth daily. 180 tablet 3   metoprolol succinate (TOPROL-XL) 100 MG 24 hr tablet Take 1 tablet (100 mg total) by mouth at bedtime. 90 tablet 3   sacubitril-valsartan (ENTRESTO) 49-51 MG Take 0.5 tablets by mouth 2 (two) times daily. 180 tablet 3   spironolactone (ALDACTONE) 25 MG tablet Take 1 tablet (25 mg total) by mouth at bedtime. 90 tablet 3   tiotropium (SPIRIVA HANDIHALER) 18 MCG inhalation capsule Place 1 capsule (18 mcg total) into inhaler and inhale daily. 30 capsule 6   traZODone (DESYREL) 50 MG tablet TAKE 1 TABLET (50 MG TOTAL) BY MOUTH AT BEDTIME AS NEEDED FOR SLEEP. 30 tablet 2   magnesium oxide (MAG-OX) 400 MG tablet Take 1 tablet (400 mg total) by mouth daily. (Patient not taking: Reported on 09/26/2021) 90 tablet 1   No current facility-administered medications for this encounter.   Wt Readings from Last 3 Encounters:  09/26/21 63.8 kg (140 lb 9.6 oz)  09/18/21  63.3 kg (139 lb 9.6 oz)  09/06/21 62.6 kg (138 lb)   BP 114/66   Pulse 68   Wt 63.8 kg (140 lb 9.6 oz)   SpO2 96%   BMI 21.38 kg/m  Physical Exam: General:  NAD. No resp difficulty, walked into clinic. thin HEENT: Normal Neck: Supple. No JVD. Carotids 2+ bilat; no bruits. No lymphadenopathy or thryomegaly appreciated. Cor: PMI nondisplaced. Regular rate & rhythm. No rubs, gallops or murmurs. Lungs: Clear Abdomen: Soft, nontender, nondistended. No hepatosplenomegaly. No bruits or masses. Good bowel sounds. Extremities: No cyanosis, clubbing, rash, edema Neuro: Alert & oriented x 3, cranial nerves grossly  intact. Moves all 4 extremities w/o difficulty. Affect pleasant.  Assessment/Plan: 1. Chronic systolic CHF: Nonischemic cardiomyopathy.  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  Cause of CMP uncertain => prior cocaine abuse but not currently.  He drinks about 3 beers/day, denies heavier drinking in past but apparently there was concern that he had had ETOH withdrawal seizures in the past.  His grandmother had CHF but apparently none of his 13 siblings have known cardiac disease. EF has been low since at least 2016.  Most recent echo in 2/23 showed EF < 20%, severe LV dilation, moderately decreased RV systolic function with mild RV enlargement. He has a Secondary school teacher ICD.  CPX 9/23 showed severe functional limitation from both HF and deconditioning. NYHA class III-IIIb symptoms, he is not volume overloaded on exam or by CoreVue.  - Continue Lasix 80 mg daily (recently reduced due to dizziness). BMET and Magnesium today with leg cramping. - Continue dapagliflozin 10 mg daily. - Continue digoxin 0.125 daily, digoxin level today.  - Continue Toprol XL 100 mg daily (no BP room to increase).  - Decrease Entresto to 24/26 bid. - Continue spironolactone 25 mg daily, change back to q am (due to frequent night urination) - Restart MagOx 400 mg daily. - cardiac MRI to look for infiltrative  disease has been scheduled for 10/24/21.  - Discussed CR. I think this will benefit him from a functional standpoint. Will arrange. - Recommended that he cut back on ETOH. Discussed community resources. - He is being evaluated for baroreceptor activation therapy. Dr. Myra Gianotti to see this week. - We briefly discussed advanced therapies. Not transplant candidate with on-going tobacco use. Hopefully can get him in CR. He has family nearby should VAD be an option. 2. COPD: Smokes about 1/4 ppd.  I encouraged him to cut down, and ideally quit.   Follow up in 6 weeks with APP and 12 weeks with Dr. Shirlee Latch. Discussed plan with Dr. Kathreen Cornfield [facility] FNP-BC 09/26/2021  Addendum: I called his sister, Kelii Chittum, and discussed details of today's visit, per patient's request.

## 2021-09-26 ENCOUNTER — Telehealth (HOSPITAL_COMMUNITY): Payer: Self-pay

## 2021-09-26 ENCOUNTER — Other Ambulatory Visit (HOSPITAL_COMMUNITY): Payer: Self-pay

## 2021-09-26 ENCOUNTER — Encounter: Payer: Medicaid Other | Admitting: *Deleted

## 2021-09-26 ENCOUNTER — Encounter (HOSPITAL_COMMUNITY): Payer: Self-pay

## 2021-09-26 ENCOUNTER — Ambulatory Visit (HOSPITAL_COMMUNITY)
Admission: RE | Admit: 2021-09-26 | Discharge: 2021-09-26 | Disposition: A | Discharge status: Home / Self Care | Payer: Medicaid Other | Source: Ambulatory Visit | Attending: Family Medicine | Admitting: Family Medicine

## 2021-09-26 VITALS — BP 114/66 | HR 68 | Wt 140.6 lb

## 2021-09-26 DIAGNOSIS — J449 Chronic obstructive pulmonary disease, unspecified: Secondary | ICD-10-CM

## 2021-09-26 DIAGNOSIS — I2510 Atherosclerotic heart disease of native coronary artery without angina pectoris: Secondary | ICD-10-CM | POA: Insufficient documentation

## 2021-09-26 DIAGNOSIS — R002 Palpitations: Secondary | ICD-10-CM | POA: Insufficient documentation

## 2021-09-26 DIAGNOSIS — R0789 Other chest pain: Secondary | ICD-10-CM | POA: Diagnosis not present

## 2021-09-26 DIAGNOSIS — Z720 Tobacco use: Secondary | ICD-10-CM

## 2021-09-26 DIAGNOSIS — Z8673 Personal history of transient ischemic attack (TIA), and cerebral infarction without residual deficits: Secondary | ICD-10-CM | POA: Diagnosis not present

## 2021-09-26 DIAGNOSIS — R252 Cramp and spasm: Secondary | ICD-10-CM | POA: Diagnosis not present

## 2021-09-26 DIAGNOSIS — I5022 Chronic systolic (congestive) heart failure: Secondary | ICD-10-CM

## 2021-09-26 DIAGNOSIS — I110 Hypertensive heart disease with heart failure: Secondary | ICD-10-CM | POA: Insufficient documentation

## 2021-09-26 DIAGNOSIS — Z8249 Family history of ischemic heart disease and other diseases of the circulatory system: Secondary | ICD-10-CM | POA: Insufficient documentation

## 2021-09-26 DIAGNOSIS — F17210 Nicotine dependence, cigarettes, uncomplicated: Secondary | ICD-10-CM | POA: Insufficient documentation

## 2021-09-26 DIAGNOSIS — Z79899 Other long term (current) drug therapy: Secondary | ICD-10-CM | POA: Insufficient documentation

## 2021-09-26 DIAGNOSIS — Z7984 Long term (current) use of oral hypoglycemic drugs: Secondary | ICD-10-CM | POA: Diagnosis not present

## 2021-09-26 DIAGNOSIS — I428 Other cardiomyopathies: Secondary | ICD-10-CM | POA: Insufficient documentation

## 2021-09-26 DIAGNOSIS — R0602 Shortness of breath: Secondary | ICD-10-CM | POA: Diagnosis present

## 2021-09-26 DIAGNOSIS — Z95810 Presence of automatic (implantable) cardiac defibrillator: Secondary | ICD-10-CM | POA: Insufficient documentation

## 2021-09-26 DIAGNOSIS — R42 Dizziness and giddiness: Secondary | ICD-10-CM | POA: Diagnosis not present

## 2021-09-26 DIAGNOSIS — Z716 Tobacco abuse counseling: Secondary | ICD-10-CM | POA: Diagnosis not present

## 2021-09-26 DIAGNOSIS — Z006 Encounter for examination for normal comparison and control in clinical research program: Secondary | ICD-10-CM

## 2021-09-26 DIAGNOSIS — F1010 Alcohol abuse, uncomplicated: Secondary | ICD-10-CM | POA: Diagnosis not present

## 2021-09-26 LAB — BASIC METABOLIC PANEL
Anion gap: 5 (ref 5–15)
BUN: 51 mg/dL — ABNORMAL HIGH (ref 6–20)
CO2: 22 mmol/L (ref 22–32)
Calcium: 9.7 mg/dL (ref 8.9–10.3)
Chloride: 108 mmol/L (ref 98–111)
Creatinine, Ser: 1.56 mg/dL — ABNORMAL HIGH (ref 0.61–1.24)
GFR, Estimated: 52 mL/min — ABNORMAL LOW (ref >60)
Glucose, Bld: 73 mg/dL (ref 70–99)
Potassium: 4.8 mmol/L (ref 3.5–5.1)
Sodium: 135 mmol/L (ref 135–145)

## 2021-09-26 LAB — DIGOXIN LEVEL: Digoxin Level: 0.5 ng/mL — ABNORMAL LOW (ref 0.8–2.0)

## 2021-09-26 LAB — MAGNESIUM: Magnesium: 2.4 mg/dL (ref 1.7–2.4)

## 2021-09-26 MED ORDER — MAGNESIUM OXIDE 400 MG PO TABS: 400.0000 mg | ORAL_TABLET | Freq: Every day | ORAL | 1 refills | Status: DC

## 2021-09-26 MED ORDER — ENTRESTO 24-26 MG PO TABS: 1.0000 | ORAL_TABLET | Freq: Two times a day (BID) | ORAL | 6 refills | Status: DC

## 2021-09-26 MED ORDER — SPIRONOLACTONE 25 MG PO TABS
25.0000 mg | ORAL_TABLET | Freq: Every morning | ORAL | 3 refills | Status: DC
Start: 2021-09-26 — End: 2021-10-03

## 2021-09-26 NOTE — Patient Instructions (Addendum)
Thank you for coming in today  Labs were done today, if any labs are abnormal the clinic will call you No news is good news  You have been referred to cardiac rehab they will contact you for further details  Move your Spironolactone 25 mg 1 tablet to every morning  RESTART Magnesium 400 mg 1 tablet daily CHANGE Entresto to 24/26 mg 1 tablet twice daily   Your physician recommends that you schedule a follow-up appointment in:  6 weeks in clinic 12 weeks with Dr. Shirlee Latch    Do the following things EVERYDAY: Weigh yourself in the morning before breakfast. Write it down and keep it in a log. Take your medicines as prescribed Eat low salt foods--Limit salt (sodium) to 2000 mg per day.  Stay as active as you can everyday Limit all fluids for the day to less than 2 liters  At the [facility], you and your health needs are our priority. As part of our continuing mission to provide you with exceptional heart care, we have created designated Provider Care Teams. These Care Teams include your primary Cardiologist (physician) and Advanced Practice Providers (APPs- Physician Assistants and Nurse Practitioners) who all work together to provide you with the care you need, when you need it.   You may see any of the following providers on your designated Care Team at your next follow up: Dr Arvilla Meres Dr Marca Ancona Dr. Marcos Eke, NP Robbie Lis, [place] [facility] [place], [place] Brynda Peon, NP Karle Plumber, PharmD   Please be sure to bring in all your medications bottles to every appointment.   If you have any questions or concerns before your next appointment please send [name] a message through [place] or call our office at [phone].    TO LEAVE A MESSAGE FOR THE NURSE SELECT OPTION 2, PLEASE LEAVE A MESSAGE INCLUDING: YOUR NAME DATE OF BIRTH CALL BACK NUMBER REASON FOR CALL**this is important as we prioritize the call backs  YOU  WILL RECEIVE A CALL BACK THE SAME DAY AS LONG AS YOU CALL BEFORE 4:00 PM

## 2021-09-26 NOTE — Addendum Note (Signed)
Encounter addended by: Jacklynn Ganong, FNP on: 09/26/2021 4:17 PM  Actions taken: Clinical Note Signed

## 2021-09-26 NOTE — Telephone Encounter (Signed)
Mr. Speigner returned my callsand I was able to advise him about his labs and med changes. I told him to hold his meds tomorrow as he already took all doses for today. He understood. I also explained how to remove one of his Lasix's to reduce his dose until we can get the correct dose delivered at 60mg  on Monday. As he only has one pill box that I filled today due to him previously having bubble packs and we are awaiting the bottles to be delivered with updated meds. He will be taking 40mg  over the weekend but knows to take an extra one if he gained >3 lbs over night and had swelling with increased shortness of breath. He understood and felt good with same. Call complete. I will follow up on Monday.   , EMT-Paramedic [phone] 09/26/2021

## 2021-09-26 NOTE — Progress Notes (Signed)
Paramedicine Encounter    Patient ID: [name], male    DOB: 1965/06/06, 56 y.o.   MRN: [id number]  Met with [name] today in clinic where he was seen by [name]. [name] reports having increased dizziness, shortness of breath on exertion and when laying flat. He reports cramps in his calfs and in his chest sometimes. He also says that he is having increased urination at night time due to spironolactone.    [name] evaluated patient and made the following changes: -move spironolactone to morning. -use Entresto 24/27m pill instead of 1/2 of 49-569m -restart mag oxide  -follow up with GI  -refer to cardiac rehab -cardiac MRI in October -Carotid scan for Barostem (Completed in clinic today by Dr. BrTrula Slade UPDATE AS OF 1645 LABS OBTAINED AND AFTER RESULTS PROVIDER MADE FOLLOWING CHANGES: -Hold EnMearl LatinFarxiga and Lasix X1 day then resume to previous doses except Lasix lower to 6092maily.   I called [name] no answer- I left voicemail and texted him to review these changes however no response. I will follow up on Monday to ensure these changes are corrected as well as home visit.    HeaSalena SanerMTSpringville7/2023     Patient Care Team: Default, Provider, MD as PCP - General RanSkeet LatchD as PCP - Cardiology (Cardiology) CamConstance HawD as PCP - Electrophysiology (Cardiology) RanSkeet LatchD as Attending Physician (Cardiology) RobMelissa MontaneN as Case Manager  Patient Active Problem List   Diagnosis Date Noted   Acute combined systolic and diastolic congestive heart failure (HCC[place]  AKI (acute kidney injury) (HCC[place]  Acute exacerbation of CHF (congestive heart failure) (HCCFrancesville2/14/2023   SVT (supraventricular tachycardia) (HCCWest Park5/27/2022   Slurred speech 07/05/2019   ICD (implantable cardioverter-defibrillator) in place 07/20/2018   COPD (chronic obstructive pulmonary disease)  (HCCRoebling1/20/2019   Obstructive sleep apnea 09/08/2016   Autonomic dysfunction 07/15/2015   DJD (degenerative joint disease), lumbar 06/19/2015   Tremor 05/14/2015   Abnormality of gait 05/14/2015   Neurogenic bladder 04/17/2015   Lumbar strain 03/23/2015   Leg weakness 03/23/2015   Major depression, chronic    History of stroke 02/26/2015   Benign essential HTN    ETOH abuse    Tobacco abuse    Chronic combined systolic and diastolic heart failure (HCCLau[place]  Alcoholic cardiomyopathy (HCCQuarryville4/06/2014   Smoking greater than 20 pack years 04/19/2014   Polysubstance abuse (HCCGary3/02/2014    Current Outpatient Medications:    albuterol (PROAIR HFA) 108 (90 Base) MCG/ACT inhaler, Inhale 2 puffs into the lungs every 6 (six) hours as needed for shortness of breath., Disp: 18 g, Rfl: 6   aspirin 81 MG EC tablet, Take 81 mg by mouth daily. Swallow whole., Disp: , Rfl:    cetirizine (ZYRTEC) 10 MG tablet, TAKE 1 TABLET (10 MG TOTAL) BY MOUTH DAILY., Disp: 30 tablet, Rfl: 0   dapagliflozin propanediol (FARXIGA) 10 MG TABS tablet, Take 1 tablet (10 mg total) by mouth daily before breakfast., Disp: 90 tablet, Rfl: 3   digoxin (LANOXIN) 0.125 MG tablet, Take 1 tablet (0.125 mg total) by mouth daily., Disp: 30 tablet, Rfl: 11   fluticasone-salmeterol (ADVAIR DISKUS) 250-50 MCG/ACT AEPB, Inhale 1 puff into the lungs in the morning and at bedtime., Disp: 1 each, Rfl: 3   furosemide (LASIX) 80 MG tablet, Take 1 tablet (80 mg total) by mouth daily., Disp: 180 tablet, Rfl:  3   magnesium oxide (MAG-OX) 400 MG tablet, Take 1 tablet (400 mg total) by mouth daily., Disp: 90 tablet, Rfl: 1   metoprolol succinate (TOPROL-XL) 100 MG 24 hr tablet, Take 1 tablet (100 mg total) by mouth at bedtime., Disp: 90 tablet, Rfl: 3   sacubitril-valsartan (ENTRESTO) 24-26 MG, Take 1 tablet by mouth 2 (two) times daily., Disp: 60 tablet, Rfl: 6   spironolactone (ALDACTONE) 25 MG tablet, Take 1 tablet (25 mg total) by mouth in  the morning., Disp: 90 tablet, Rfl: 3   tiotropium (SPIRIVA HANDIHALER) 18 MCG inhalation capsule, Place 1 capsule (18 mcg total) into inhaler and inhale daily., Disp: 30 capsule, Rfl: 6   traZODone (DESYREL) 50 MG tablet, TAKE 1 TABLET (50 MG TOTAL) BY MOUTH AT BEDTIME AS NEEDED FOR SLEEP., Disp: 30 tablet, Rfl: 2 Allergies  Allergen Reactions   Penicillins Other (See Comments)    Blisters  Has patient had a PCN reaction causing immediate rash, facial/tongue/throat swelling, SOB or lightheadedness with hypotension: Yes Has patient had a PCN reaction causing severe rash involving mucus membranes or skin necrosis: Yes Has patient had a PCN reaction that required hospitalization: No Has patient had a PCN reaction occurring within the last 10 years: No If all of the above answers are "NO", then may proceed with Cephalosporin use.      Social History   Socioeconomic History   Marital status: Single    Spouse name: Not on file   Number of children: 1   Years of education: 12   Highest education level: Not on file  Occupational History   Not on file  Tobacco Use   Smoking status: Every Day    Packs/day: 1.00    Years: 20.00    Total pack years: 20.00    Types: Cigarettes   Smokeless tobacco: Never   Tobacco comments:    05/14/16 smoking 1 PPD  Vaping Use   Vaping Use: Never used  Substance and Sexual Activity   Alcohol use: Yes    Alcohol/week: 1.0 standard drink of alcohol    Types: 1 Cans of beer per week    Comment: socially    Drug use: Not Currently    Types: Cocaine    Comment: stopped using cocaine 11/19/14   Sexual activity: Not on file  Other Topics Concern   Not on file  Social History Narrative   Lives alone, nurse comes 2 hrs daily   Social Determinants of Health   Financial Resource Strain: Not on file  Food Insecurity: Food Insecurity Present (09/26/2021)   Hunger Vital Sign    Worried About Running Out of Food in the Last Year: Often true    Ran Out of  Food in the Last Year: Sometimes true  Transportation Needs: Unmet Transportation Needs (05/31/2021)   PRAPARE - Hydrologist (Medical): Yes    Lack of Transportation (Non-Medical): Yes  Physical Activity: Not on file  Stress: Not on file  Social Connections: Not on file  Intimate Partner Violence: Not on file    Physical Exam      Future Appointments  Date Time Provider [place]  10/23/2021  3:00 PM Constance Haw, MD [facility]  10/24/2021  1:00 PM MC-MR 1 MC-MRI [facility]  10/25/2021 11:15 AM THN CCC-MM CARE MANAGER THN-CCC None  11/07/2021 11:30 AM MC-HVSC PA/Wells MC-HVSC None  11/11/2021  7:25 AM CVD[facility] DEVICE REMOTES [facility]  12/11/2021  3:10 PM [name], MD  CHW-CHWW None  12/23/2021 10:20 AM Larey Dresser, MD MC-HVSC None  02/10/2022  7:25 AM CVD[facility] DEVICE REMOTES [facility]  05/12/2022  7:25 AM CVD-[facility] DEVICE REMOTES [facility]  08/11/2022  7:25 AM CVD-[facility] DEVICE REMOTES [facility]  11/10/2022  7:25 AM [facility] DEVICE REMOTES [facility]  02/09/2023  7:25 AM CVD[facility] DEVICE REMOTES [facility]     ACTION: Home visit completed

## 2021-09-26 NOTE — Progress Notes (Signed)
H&V Care Navigation CSW Progress Note  Clinical Social Worker met with patient to discuss food insecurity.  Patient is participating in a Managed Medicaid Plan:  Yes  Informed paramedic he has food stamps but not sufficient.  CSW provided with referral to [facility] Table and information about other local food pantries and supplied with Heart and Vascular food bag.  SDOH Screenings   Food Insecurity: Food Insecurity Present (09/26/2021)  Housing: Low Risk  (05/16/2021)  Transportation Needs: Unmet Transportation Needs (05/31/2021)  Alcohol Screen: Low Risk  (05/30/2021)  Depression (PHQ2-9): Low Risk  (03/27/2021)  Tobacco Use: High Risk (09/26/2021)   Jorge Ny, LCSW Clinical Social Worker [facility] Desk#: [phone] Cell#: [phone]

## 2021-09-27 ENCOUNTER — Telehealth (HOSPITAL_COMMUNITY): Payer: Self-pay

## 2021-09-27 DIAGNOSIS — I5022 Chronic systolic (congestive) heart failure: Secondary | ICD-10-CM

## 2021-09-27 MED ORDER — FUROSEMIDE 20 MG PO TABS
60.0000 mg | ORAL_TABLET | Freq: Every day | ORAL | Status: DC
Start: 2021-09-27 — End: 2021-09-30

## 2021-09-27 NOTE — Research (Signed)
[facility]ham seen patient while he was in HF clinic.  Patient bifurcation is too high for batwire he will have to go commercial.  Patient aware  Mercer Pod :) RN BSN  Clinical Research Nurse  Be strong and take heart, all you who hope in the Lord. ~ Psalm 31:24

## 2021-09-27 NOTE — Telephone Encounter (Signed)
Results note updated

## 2021-09-27 NOTE — Telephone Encounter (Addendum)
Heather informed patient of med changes. Called and scheduled labs for him, med list updated   ----- Message from Jacklynn Ganong, FNP sent at 09/26/2021  4:28 PM EDT ----- Kidney function elevated.  Hold Entresto, spiro, Farxiga and Lasix x 1 day, then resume at previous doses except lower Lasix dose to 60 mg daily  Repeat BMET in 1 week.  I will forward to [facility] with paramedicine as well.

## 2021-09-30 ENCOUNTER — Telehealth (HOSPITAL_COMMUNITY): Payer: Self-pay

## 2021-09-30 ENCOUNTER — Encounter: Payer: Medicaid Other | Admitting: Surgery

## 2021-09-30 ENCOUNTER — Other Ambulatory Visit (HOSPITAL_COMMUNITY): Payer: Self-pay

## 2021-09-30 MED ORDER — FUROSEMIDE 20 MG PO TABS
60.0000 mg | ORAL_TABLET | Freq: Every day | ORAL | 5 refills | Status: DC
Start: 2021-09-30 — End: 2021-10-03
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
## 2021-10-02 ENCOUNTER — Other Ambulatory Visit (HOSPITAL_COMMUNITY): Payer: Medicaid Other

## 2021-10-02 ENCOUNTER — Telehealth (HOSPITAL_COMMUNITY): Payer: Self-pay

## 2021-10-02 NOTE — Telephone Encounter (Signed)
Spoke to Mr. Broski to remind him of his labs for today however he reports he won't be able to make this appointment as he thought it was for tomorrow at 1030 & he already set up his transportation for tomorrow.   Could we switch his lab appointment to tomorrow at 1030?   Maralyn Sago, EMT-Paramedic [phone] 10/02/2021

## 2021-10-03 ENCOUNTER — Telehealth (HOSPITAL_COMMUNITY): Payer: Self-pay | Admitting: *Deleted

## 2021-10-03 ENCOUNTER — Other Ambulatory Visit (HOSPITAL_COMMUNITY): Payer: Self-pay

## 2021-10-03 ENCOUNTER — Ambulatory Visit (HOSPITAL_COMMUNITY)
Admission: RE | Admit: 2021-10-03 | Discharge: 2021-10-03 | Disposition: A | Discharge status: Home / Self Care | Payer: Medicaid Other | Source: Ambulatory Visit | Attending: Internal Medicine | Admitting: Internal Medicine

## 2021-10-03 DIAGNOSIS — I5022 Chronic systolic (congestive) heart failure: Secondary | ICD-10-CM

## 2021-10-03 LAB — BASIC METABOLIC PANEL
Anion gap: 11 (ref 5–15)
BUN: 71 mg/dL — ABNORMAL HIGH (ref 6–20)
CO2: 17 mmol/L — ABNORMAL LOW (ref 22–32)
Calcium: 9.6 mg/dL (ref 8.9–10.3)
Chloride: 103 mmol/L (ref 98–111)
Creatinine, Ser: 2.97 mg/dL — ABNORMAL HIGH (ref 0.61–1.24)
GFR, Estimated: 24 mL/min — ABNORMAL LOW (ref >60)
Glucose, Bld: 80 mg/dL (ref 70–99)
Potassium: 5.2 mmol/L — ABNORMAL HIGH (ref 3.5–5.1)
Sodium: 131 mmol/L — ABNORMAL LOW (ref 135–145)

## 2021-10-03 MED ORDER — FUROSEMIDE 20 MG PO TABS: 60.0000 mg | ORAL_TABLET | ORAL | 5 refills | Status: DC | PRN

## 2021-10-03 NOTE — Telephone Encounter (Signed)
Pt in for labs today:   K 5.2 Bun 71 Cr 2.97  Spoke w/Sher Hellinger, community paramedic, she is at pt's home now, reports pt has been taking all meds as prescribed and held meds last Friday as directed by our clinic. BP 98/60, HR 87, wt down 1 lb since last week (139 lbs), pt c/o increased fatigue and continued dizziness. Device transmission sent:    All reviewed w/Brittainy Sharol Harness, [place], she advises:  Stop Spironolactone Change Furosemide to PRN ONLY Hold Farxiga x 2 days, restart 9/17 Hold Entresto tonight and tomorrow AM, restart Fri 9/15 PM Repeat labs Monday 9/18   Herbert Seta is aware and will adjust med/pill box accordingly, sch pt for repeat labs 9/18 at 12

## 2021-10-03 NOTE — Progress Notes (Signed)
Paramedicine Encounter    Patient ID: [name], male    DOB: [birth year], 56 y.o.   MRN: [id number]  Arrived for home visit for [name] who was alert and oriented reporting to be feeling tired today and stated he is continuing to have some episodes of dizziness. I obtained vitals and they are as noted:  WT- 139lbs BP- 98/60 HR- 87 O2- 97% RR- 16   Labs were drawn today and were abnormal. I contacted HF clinic for provider to advise on labs and persistent symptoms. The following changes were made per Ellen Henri PA-  Stop Spironolactone Change Furosemide to PRN ONLY Hold Farxiga x 2 days, restart 9/17 Hold Entresto tonight and tomorrow AM, restart Fri 9/15 PM Repeat labs Monday 9/18  I reviewed same with patient and educated him on PRN Lasix use. We confirmed meds filling pill box accordingly for one week. He reports he needs help with getting a ride- he reports that his medicaid transport is always late and he does not want to use it. I will reach out to [place] about same.    We set up his MyChart account today.   We reviewed upcoming appointments and confirmed same.   No refills needed at this time.   [name], EMT-Paramedic [phone] 10/03/2021   Patient Care Team: Default, Provider, MD as PCP - General Skeet Latch, MD as PCP - Cardiology (Cardiology) Constance Haw, MD as PCP - Electrophysiology (Cardiology) Skeet Latch, MD as Attending Physician (Cardiology) [name], RN as Case Manager  Patient Active Problem List   Diagnosis Date Noted   Acute combined systolic and diastolic congestive heart failure ([place])    AKI (acute kidney injury) ([place])    Acute exacerbation of CHF (congestive heart failure) ([place]) 03/05/2021   SVT (supraventricular tachycardia) ([place]) 06/15/2020   Slurred speech 07/05/2019   ICD (implantable cardioverter-defibrillator) in place 07/20/2018   COPD (chronic obstructive pulmonary disease) ([place])  12/09/2017   Obstructive sleep apnea 09/08/2016   Autonomic dysfunction 07/15/2015   DJD (degenerative joint disease), lumbar 06/19/2015   Tremor 05/14/2015   Abnormality of gait 05/14/2015   Neurogenic bladder 04/17/2015   Lumbar strain 03/23/2015   Leg weakness 03/23/2015   Major depression, chronic    History of stroke 02/26/2015   Benign essential HTN    ETOH abuse    Tobacco abuse    Chronic combined systolic and diastolic heart failure ([place])    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 Swallow whole., Disp: , Rfl:    cetirizine (ZYRTEC) 10 MG tablet, TAKE 1 TABLET (10 MG TOTAL) BY MOUTH DAILY., Disp: 30 tablet, Rfl: 0   dapagliflozin propanediol (FARXIGA) 10 MG TABS tablet, Take 1 tablet (10 mg total) by mouth daily before breakfast., Disp: 90 tablet, Rfl: 3   digoxin (LANOXIN) 0.125 MG tablet, Take 1 tablet (0.125 mg total) by mouth daily., Disp: 30 tablet, Rfl: 11   fluticasone-salmeterol (ADVAIR DISKUS) 250-50 MCG/ACT AEPB, Inhale 1 puff into the lungs in the morning and at bedtime., Disp: 1 each, Rfl: 3   furosemide (LASIX) 20 MG tablet, Take 3 tablets (60  mg total) by mouth daily., Disp: 90 tablet, Rfl: 5   magnesium oxide (MAG-OX) 400 MG tablet, Take 1 tablet (400 mg total) by mouth daily., Disp: 90 tablet, Rfl: 1   metoprolol succinate (TOPROL-XL) 100 MG 24 hr tablet, Take 1 tablet (100 mg total) by mouth at bedtime., Disp: 90 tablet, Rfl: 3   sacubitril-valsartan (ENTRESTO) 24-26 MG, Take 1 tablet by mouth 2 (two) times daily., Disp: 60 tablet, Rfl: 6   spironolactone (ALDACTONE) 25 MG tablet, Take 1 tablet (25 mg total) by mouth in the  morning., Disp: 90 tablet, Rfl: 3   tiotropium (SPIRIVA HANDIHALER) 18 MCG inhalation capsule, Place 1 capsule (18 mcg total) into inhaler and inhale daily., Disp: 30 capsule, Rfl: 6   traZODone (DESYREL) 50 MG tablet, TAKE 1 TABLET (50 MG TOTAL) BY MOUTH AT BEDTIME AS NEEDED FOR SLEEP., Disp: 30 tablet, Rfl: 2 Allergies  Allergen Reactions   Penicillins Other (See Comments)    Blisters  Has patient had a PCN reaction causing immediate rash, facial/tongue/throat swelling, SOB or lightheadedness with hypotension: Yes Has patient had a PCN reaction causing severe rash involving mucus membranes or skin necrosis: Yes Has patient had a PCN reaction that required hospitalization: No Has patient had a PCN reaction occurring within the last 10 years: No If all of the above answers are "NO", then may proceed with Cephalosporin use.      Social History   Socioeconomic History   Marital status: Single    Spouse name: Not on file   Number of children: 1   Years of education: 12   Highest education level: Not on file  Occupational History   Not on file  Tobacco Use   Smoking status: Every Day    Packs/day: 1.00    Years: 20.00    Total pack years: 20.00    Types: Cigarettes   Smokeless tobacco: Never   Tobacco comments:    05/14/16 smoking 1 PPD  Vaping Use   Vaping Use: Never used  Substance and Sexual Activity   Alcohol use: Yes    Alcohol/week: 1.0 standard drink of alcohol    Types: 1 Cans of beer per week    Comment: socially    Drug use: Not Currently    Types: Cocaine    Comment: stopped using cocaine 11/19/14   Sexual activity: Not on file  Other Topics Concern   Not on file  Social History Narrative   Lives alone, nurse comes 2 hrs daily   Social Determinants of Health   Financial Resource Strain: Not on file  Food Insecurity: Food Insecurity Present (09/26/2021)   Hunger Vital Sign    Worried About Running Out of Food in the Last Year: Often true    Ran Out of Food  in the Last Year: Sometimes true  Transportation Needs: Unmet Transportation Needs (05/31/2021)   PRAPARE - Administrator, Civil Service (Medical): Yes    Lack of Transportation (Non-Medical): Yes  Physical Activity: Not on file  Stress: Not on file  Social Connections: Not on file  Intimate Partner Violence: Not on file    Physical Exam      Future Appointments  Date Time Provider Department Center  10/23/2021  3:00 PM Regan Lemming, MD CVD[facility]  10/24/2021  1:00 PM MC-MR 1 MC-MRI [facility]  10/25/2021 11:15 AM THN CCC-MM CARE MANAGER THN-CCC None  11/07/2021 11:30 AM MC-HVSC PA/NP MC-HVSC None  11/11/2021  7:25 AM CVD-CHURCH DEVICE REMOTES CVD-CHUSTOFF  [facility]  12/11/2021  3:10 PM Hoy Register, MD CHW-CHWW None  12/23/2021 10:20 AM Laurey Morale, MD MC-HVSC None  02/10/2022  7:25 AM CVD-CHURCH DEVICE REMOTES [facility]OFF [facility]  05/12/2022  7:25 AM CVD-CHURCH DEVICE REMOTES [facility]OFF [facility]  08/11/2022  7:25 AM CVD-CHURCH DEVICE REMOTES [facility]OFF [facility]  11/10/2022  7:25 AM CVD-CHURCH DEVICE REMOTES CVD[facility]OFF [facility]  02/09/2023  7:25 AM CVD-CHURCH DEVICE REMOTES CVD-[facility]OFF [facility]     ACTION: Home visit completed

## 2021-10-07 ENCOUNTER — Ambulatory Visit (HOSPITAL_COMMUNITY)
Admission: RE | Admit: 2021-10-07 | Discharge: 2021-10-07 | Disposition: A | Discharge status: Home / Self Care | Payer: Medicaid Other | Source: Ambulatory Visit | Attending: Cardiology | Admitting: Cardiology

## 2021-10-07 ENCOUNTER — Telehealth: Payer: Self-pay | Admitting: Licensed Clinical Social Worker

## 2021-10-07 ENCOUNTER — Telehealth (HOSPITAL_COMMUNITY): Payer: Self-pay | Admitting: *Deleted

## 2021-10-07 ENCOUNTER — Other Ambulatory Visit (HOSPITAL_COMMUNITY): Payer: Self-pay | Admitting: Family Medicine

## 2021-10-07 ENCOUNTER — Telehealth (HOSPITAL_COMMUNITY): Payer: Self-pay

## 2021-10-07 DIAGNOSIS — I5022 Chronic systolic (congestive) heart failure: Secondary | ICD-10-CM | POA: Insufficient documentation

## 2021-10-07 LAB — BASIC METABOLIC PANEL
Anion gap: 8 (ref 5–15)
BUN: 56 mg/dL — ABNORMAL HIGH (ref 6–20)
CO2: 19 mmol/L — ABNORMAL LOW (ref 22–32)
Calcium: 9.7 mg/dL (ref 8.9–10.3)
Chloride: 107 mmol/L (ref 98–111)
Creatinine, Ser: 2.27 mg/dL — ABNORMAL HIGH (ref 0.61–1.24)
GFR, Estimated: 33 mL/min — ABNORMAL LOW (ref >60)
Glucose, Bld: 103 mg/dL — ABNORMAL HIGH (ref 70–99)
Potassium: 5.9 mmol/L — ABNORMAL HIGH (ref 3.5–5.1)
Sodium: 134 mmol/L — ABNORMAL LOW (ref 135–145)

## 2021-10-07 LAB — BRAIN NATRIURETIC PEPTIDE: B Natriuretic Peptide: 204.7 pg/mL — ABNORMAL HIGH (ref 0.0–100.0)

## 2021-10-07 LAB — DIGOXIN LEVEL: Digoxin Level: 1.2 ng/mL (ref 0.8–2.0)

## 2021-10-07 MED ORDER — LOKELMA 10 G PO PACK: 10.0000 g | PACK | Freq: Every day | ORAL | 0 refills | Status: DC

## 2021-10-07 NOTE — Progress Notes (Signed)
Called patient and discussed lab results. He will hold Entresto, spiro, Wilder Glade and stop digoxin. He will take Lokelma 10 g x 1 today and he has repeat BMET on Wednesday. Will decide on re-starting GDMT after repeat labs. He is agreeable with plan. Med list up dated and [facility] sent to [employer]. I asked him to call clinic tomorrow if he has not received his [facility] and we will give him samples.  [facility], FNP-BC.

## 2021-10-07 NOTE — Telephone Encounter (Signed)
Attempted to call [name] to inform him of lab results, med changes and plans but no answer. I will attempt again tomorrow morning and pick up sample of Lokelma to deliver to patient.   [name], [place] 10/07/2021

## 2021-10-07 NOTE — Telephone Encounter (Signed)
Spoke w/Graciemae Delisle, community paramedic, she is aware and will advise pt to hold meds, she will pick up sample [facility] and deliver to him tomorrow, repeat lab sch for Wed 9/20.   [place], FNP  10/07/2021  3:28 PM EDT     Kidney function, dig level and K elevated.   Stop Entresto, spiro, digoxin and [place].   Give Lokelam 10 g x 1 dose today.   Repeat BMET on Wednesday

## 2021-10-07 NOTE — Telephone Encounter (Signed)
H&V Care Navigation CSW Progress Note  Clinical Social Worker  was contacted by community paramedic  to request ride to labs today. Pt other ride fell through. Was able to arrange 11:30am pick up for pt via [facility] program. Will arrange ride home when labs complete.  Patient is participating in a Managed Medicaid Plan:  Yes- Healthy Blue Medicaid  [place]: Food Insecurity Present (09/26/2021)  Housing: Low Risk  (05/16/2021)  Transportation Needs: Unmet Transportation Needs (05/31/2021)  Alcohol Screen: Low Risk  (05/30/2021)  Depression (PHQ2-9): Low Risk  (03/27/2021)  Tobacco Use: High Risk (09/26/2021)    Westley Hummer, MSW, [place]  [phone]- work cell phone (preferred) [phone]- desk phone

## 2021-10-08 ENCOUNTER — Other Ambulatory Visit (HOSPITAL_COMMUNITY): Payer: Self-pay

## 2021-10-08 NOTE — Telephone Encounter (Signed)
Medication Samples have been provided to the patient.  Drug name: Lokelma       Strength: 10mg         Qty: 1 packet  LOT: [id number]  Exp.Date: 03/20/23  Dosing instructions: Take 1 dose  The patient has been instructed regarding the correct time, dose, and frequency of taking this medication, including desired effects and most common side effects.   [name] 9:41 AM 10/08/2021

## 2021-10-08 NOTE — Progress Notes (Signed)
Paramedicine Encounter    Patient ID: [name], male    DOB: Jan 28, 1965, 56 y.o.   MRN: [id number]    Follow up visit with [name] following his labs yesterday. His medications stopped by HF clinic are the following: -Entresto, spiro, Wilder Glade and stop digoxin. (LAST DOSE OF THESE TAKEN AT 0800 THIS MORNING)   Vitals obtained: (sitting) BP 110/78 HR 60   He still reports feeling dizzy and weak.    Lokelma dose one time dose taken at 10:00.   He will have follow up labs Thursday morning at 0900.   I plan to follow up after labs.   [name], [place] 10/08/2021         Patient Care Team: Default, Provider, MD as PCP - General Skeet Latch, MD as PCP - Cardiology (Cardiology) Constance Haw, MD as PCP - Electrophysiology (Cardiology) Skeet Latch, MD as Attending Physician (Cardiology) [name], RN as Case Manager  Patient Active Problem List   Diagnosis Date Noted   Acute combined systolic and diastolic congestive heart failure ([place])    AKI (acute kidney injury) ([place])    Acute exacerbation of CHF (congestive heart failure) ([place]) 03/05/2021   SVT (supraventricular tachycardia) ([place]) 06/15/2020   Slurred speech 07/05/2019   ICD (implantable cardioverter-defibrillator) in place 07/20/2018   COPD (chronic obstructive pulmonary disease) ([place]) 12/09/2017   Obstructive sleep apnea 09/08/2016   Autonomic dysfunction 07/15/2015   DJD (degenerative joint disease), lumbar 06/19/2015   Tremor 05/14/2015   Abnormality of gait 05/14/2015   Neurogenic bladder 04/17/2015   Lumbar strain 03/23/2015   Leg weakness 03/23/2015   Major depression, chronic    History of stroke 02/26/2015   Benign essential HTN    ETOH abuse    Tobacco abuse    Chronic combined systolic and diastolic heart failure ([place])    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 Swallow whole., Disp: , Rfl:    cetirizine (ZYRTEC) 10 MG tablet, TAKE 1 TABLET (10 MG TOTAL) BY MOUTH DAILY., Disp: 30 tablet, Rfl: 0   fluticasone-salmeterol (ADVAIR DISKUS) 250-50 MCG/ACT AEPB, Inhale 1 puff into the lungs in the morning and at bedtime., Disp: 1 each, Rfl: 3   furosemide (LASIX) 20 MG tablet, Take 3 tablets (60 mg total) by mouth as needed., Disp: 90 tablet, Rfl: 5   magnesium oxide (MAG-OX) 400 MG tablet, Take 1 tablet (400 mg total) by mouth daily., Disp: 90 tablet, Rfl: 1   metoprolol succinate (TOPROL-XL) 100 MG 24 hr tablet, Take 1 tablet (100 mg total) by mouth at bedtime., Disp: 90 tablet, Rfl: 3   sodium zirconium cyclosilicate (LOKELMA) 10 g PACK packet, Take 10 g by mouth daily., Disp: 1 packet, Rfl: 0   tiotropium (SPIRIVA HANDIHALER) 18 MCG inhalation capsule, Place 1 capsule (18 mcg total) into inhaler and inhale daily., Disp: 30 capsule, Rfl: 6   traZODone (DESYREL) 50 MG tablet, TAKE 1 TABLET (50 MG TOTAL) BY MOUTH AT BEDTIME AS NEEDED FOR SLEEP., Disp: 30 tablet, Rfl: 2 Allergies  Allergen Reactions   Penicillins Other (See Comments)    Blisters  Has  patient had a PCN reaction causing immediate rash, facial/tongue/throat swelling, SOB or lightheadedness with hypotension: Yes Has patient had a PCN reaction causing severe rash involving mucus membranes or skin necrosis: Yes Has patient had a PCN reaction that required hospitalization: No Has patient had a PCN reaction occurring within the last 10 years: No If all of the above answers are "NO", then may proceed with Cephalosporin use.      Social History   Socioeconomic History   Marital status: Single    Spouse name: Not on file   Number  of children: 1   Years of education: 12   Highest education level: Not on file  Occupational History   Not on file  Tobacco Use   Smoking status: Every Day    Packs/day: 1.00    Years: 20.00    Total pack years: 20.00    Types: Cigarettes   Smokeless tobacco: Never   Tobacco comments:    05/14/16 smoking 1 PPD  Vaping Use   Vaping Use: Never used  Substance and Sexual Activity   Alcohol use: Yes    Alcohol/week: 1.0 standard drink of alcohol    Types: 1 Cans of beer per week    Comment: socially    Drug use: Not Currently    Types: Cocaine    Comment: stopped using cocaine 11/19/14   Sexual activity: Not on file  Other Topics Concern   Not on file  Social History Narrative   Lives alone, nurse comes 2 hrs daily   Social Determinants of Health   Financial Resource Strain: Not on file  Food Insecurity: Food Insecurity Present (09/26/2021)   Hunger Vital Sign    Worried About Running Out of Food in the Last Year: Often true    Ran Out of Food in the Last Year: Sometimes true  Transportation Needs: Unmet Transportation Needs (10/07/2021)   PRAPARE - Hydrologist (Medical): Yes    Lack of Transportation (Non-Medical): Yes  Physical Activity: Not on file  Stress: Not on file  Social Connections: Not on file  Intimate Partner Violence: Not on file    Physical Exam      Future Appointments  Date Time Provider [place]  10/09/2021  1:45 PM MC-HVSC LAB MC-HVSC None  10/10/2021  9:00 AM MC-HVSC LAB MC-HVSC None  10/23/2021  3:00 PM Constance Haw, MD CVD[facility]OFF [facility]  10/24/2021  1:00 PM MC-MR 1 MC-MRI [place]  10/25/2021  2:00 PM THN CCC-MM CARE MANAGER THN-CCC None  11/07/2021 11:30 AM MC-HVSC PA/NP MC-HVSC None  11/11/2021  7:25 AM CVD-CHURCH DEVICE REMOTES CVD-[facility]OFF [facility]  12/11/2021  3:10 PM [name], MD CHW-CHWW None  12/23/2021 10:20 AM Larey Dresser, MD MC-HVSC None  02/10/2022  7:25 AM CVD-CHURCH  DEVICE REMOTES CVD-[facility]OFF [facility]  05/12/2022  7:25 AM CVD-CHURCH DEVICE REMOTES CVD-[facility]OFF [facility]  08/11/2022  7:25 AM CVD-CHURCH DEVICE REMOTES CVD-[facility]OFF [facility]  11/10/2022  7:25 AM CVD-CHURCH DEVICE REMOTES CVD-[facility]OFF [facility]  02/09/2023  7:25 AM CVD-CHURCH DEVICE REMOTES CVD-[facility]OFF [facility]     ACTION: Home visit completed

## 2021-10-09 ENCOUNTER — Other Ambulatory Visit (HOSPITAL_COMMUNITY): Payer: Medicaid Other

## 2021-10-09 ENCOUNTER — Telehealth: Payer: Self-pay | Admitting: Licensed Clinical Social Worker

## 2021-10-09 ENCOUNTER — Telehealth (HOSPITAL_COMMUNITY): Payer: Self-pay

## 2021-10-09 NOTE — Telephone Encounter (Signed)
Called patient to see if he is interested in the Cardiac Rehab Program. Patient expressed interest. Explained scheduling process and went over insurance, patient verbalized understanding.  ?

## 2021-10-09 NOTE — Telephone Encounter (Signed)
H&V Care Navigation CSW Progress Note  Clinical Social Worker was contacted by community paramedic Heather to request ride to labs tomorrow. Pt other ride fell through. Was able to arrange 8:30am pick up for pt via [facility] program. Will arrange ride home when labs complete however paramedic will emphasize that he must utilize his Medicaid benefits moving forward when in need of transportation.    Patient is participating in a Managed Medicaid Plan:  Yes- Healthy Blue Medicaid  [place]: Food Insecurity Present (09/26/2021)  Housing: Low Risk  (05/16/2021)  Transportation Needs: Unmet Transportation Needs (10/07/2021)  Alcohol Screen: Low Risk  (05/30/2021)  Depression (PHQ2-9): Low Risk  (03/27/2021)  Tobacco Use: High Risk (09/26/2021)   Westley Hummer, MSW, [place]  [phone]- work cell phone (preferred) [phone]- desk phone

## 2021-10-09 NOTE — Telephone Encounter (Signed)
Pt insurance is active and benefits verified through [facility]. Co-pay $4.00, DED $0.00/$0.00 met, out of pocket $0.00/$0.00 met, co-insurance 0%. No pre-authorization required. Passport, 10/09/21 @ 9:02AM, REF#[id number]   How many CR sessions are covered? (36 sessions for TCR, 72 sessions for ICR)36 Is this a lifetime maximum or an annual maximum? No Has the member used any of these services to date? No Is there a time limit (weeks/months) on start of program and/or program completion? No     Will contact patient to see if he is interested in the Cardiac Rehab Program.

## 2021-10-10 ENCOUNTER — Telehealth (HOSPITAL_COMMUNITY): Payer: Self-pay

## 2021-10-10 ENCOUNTER — Ambulatory Visit (HOSPITAL_COMMUNITY)
Admission: RE | Admit: 2021-10-10 | Discharge: 2021-10-10 | Disposition: A | Discharge status: Home / Self Care | Payer: Medicaid Other | Source: Ambulatory Visit | Attending: Internal Medicine | Admitting: Internal Medicine

## 2021-10-10 DIAGNOSIS — I5022 Chronic systolic (congestive) heart failure: Secondary | ICD-10-CM | POA: Insufficient documentation

## 2021-10-10 LAB — BASIC METABOLIC PANEL
Anion gap: 6 (ref 5–15)
BUN: 38 mg/dL — ABNORMAL HIGH (ref 6–20)
CO2: 21 mmol/L — ABNORMAL LOW (ref 22–32)
Calcium: 9.6 mg/dL (ref 8.9–10.3)
Chloride: 106 mmol/L (ref 98–111)
Creatinine, Ser: 1.53 mg/dL — ABNORMAL HIGH (ref 0.61–1.24)
GFR, Estimated: 53 mL/min — ABNORMAL LOW (ref >60)
Glucose, Bld: 120 mg/dL — ABNORMAL HIGH (ref 70–99)
Potassium: 4.8 mmol/L (ref 3.5–5.1)
Sodium: 133 mmol/L — ABNORMAL LOW (ref 135–145)

## 2021-10-10 NOTE — Telephone Encounter (Signed)
Spoke to [place] him to continue only taking inhalers, Magnesium and Metoprolol and tramadol, lasix PRN prescriptions and over the counter meds until he is seen in clinic next Friday. He agreed with plan and know what to take. I will follow up after he is seen in clinic next week.   [name], [place] 10/10/2021

## 2021-10-16 ENCOUNTER — Telehealth (HOSPITAL_COMMUNITY): Payer: Self-pay

## 2021-10-16 NOTE — Telephone Encounter (Signed)
Assisted Jaymes in setting up his transportation for his appointment on Friday to the HF clinic.  [facility]  Pick up from [place] pick up 1230-1300  Return- call when ready   Call complete.   [name], [place] 10/16/2021

## 2021-10-17 ENCOUNTER — Telehealth (HOSPITAL_COMMUNITY): Payer: Self-pay

## 2021-10-17 NOTE — Telephone Encounter (Signed)
Called to confirm/remind patient of their appointment at the [facility] on 10/18/21.   Patient reminded to bring all medications and/or complete list.  Confirmed patient has transportation. Gave directions, instructed to utilize valet parking.  Confirmed appointment prior to ending call.   

## 2021-10-18 ENCOUNTER — Encounter (HOSPITAL_COMMUNITY): Payer: Self-pay

## 2021-10-18 ENCOUNTER — Ambulatory Visit (HOSPITAL_COMMUNITY)
Admission: RE | Admit: 2021-10-18 | Discharge: 2021-10-18 | Disposition: A | Discharge status: Home / Self Care | Payer: Medicaid Other | Source: Ambulatory Visit | Attending: Internal Medicine | Admitting: Internal Medicine

## 2021-10-18 ENCOUNTER — Encounter (HOSPITAL_COMMUNITY): Payer: Medicaid Other

## 2021-10-18 VITALS — BP 130/82 | HR 65 | Wt 148.0 lb

## 2021-10-18 DIAGNOSIS — I110 Hypertensive heart disease with heart failure: Secondary | ICD-10-CM | POA: Insufficient documentation

## 2021-10-18 DIAGNOSIS — J449 Chronic obstructive pulmonary disease, unspecified: Secondary | ICD-10-CM | POA: Insufficient documentation

## 2021-10-18 DIAGNOSIS — Z79899 Other long term (current) drug therapy: Secondary | ICD-10-CM | POA: Diagnosis not present

## 2021-10-18 DIAGNOSIS — I5022 Chronic systolic (congestive) heart failure: Secondary | ICD-10-CM | POA: Insufficient documentation

## 2021-10-18 DIAGNOSIS — Z8249 Family history of ischemic heart disease and other diseases of the circulatory system: Secondary | ICD-10-CM | POA: Insufficient documentation

## 2021-10-18 DIAGNOSIS — F17210 Nicotine dependence, cigarettes, uncomplicated: Secondary | ICD-10-CM | POA: Insufficient documentation

## 2021-10-18 DIAGNOSIS — I2510 Atherosclerotic heart disease of native coronary artery without angina pectoris: Secondary | ICD-10-CM | POA: Diagnosis not present

## 2021-10-18 DIAGNOSIS — R5383 Other fatigue: Secondary | ICD-10-CM | POA: Insufficient documentation

## 2021-10-18 DIAGNOSIS — I428 Other cardiomyopathies: Secondary | ICD-10-CM | POA: Diagnosis not present

## 2021-10-18 DIAGNOSIS — R0600 Dyspnea, unspecified: Secondary | ICD-10-CM | POA: Insufficient documentation

## 2021-10-18 DIAGNOSIS — Z720 Tobacco use: Secondary | ICD-10-CM | POA: Diagnosis not present

## 2021-10-18 DIAGNOSIS — Z95810 Presence of automatic (implantable) cardiac defibrillator: Secondary | ICD-10-CM | POA: Diagnosis not present

## 2021-10-18 DIAGNOSIS — Z8673 Personal history of transient ischemic attack (TIA), and cerebral infarction without residual deficits: Secondary | ICD-10-CM | POA: Diagnosis not present

## 2021-10-18 DIAGNOSIS — I5042 Chronic combined systolic (congestive) and diastolic (congestive) heart failure: Secondary | ICD-10-CM

## 2021-10-18 LAB — BASIC METABOLIC PANEL
Anion gap: 6 (ref 5–15)
BUN: 12 mg/dL (ref 6–20)
CO2: 23 mmol/L (ref 22–32)
Calcium: 9.2 mg/dL (ref 8.9–10.3)
Chloride: 108 mmol/L (ref 98–111)
Creatinine, Ser: 0.88 mg/dL (ref 0.61–1.24)
GFR, Estimated: >60 mL/min (ref >60)
Glucose, Bld: 86 mg/dL (ref 70–99)
Potassium: 3.8 mmol/L (ref 3.5–5.1)
Sodium: 137 mmol/L (ref 135–145)

## 2021-10-18 LAB — BRAIN NATRIURETIC PEPTIDE: B Natriuretic Peptide: 2232.7 pg/mL — ABNORMAL HIGH (ref 0.0–100.0)

## 2021-10-18 MED ORDER — FUROSEMIDE 20 MG PO TABS: 60.0000 mg | ORAL_TABLET | Freq: Every day | ORAL | 6 refills | Status: DC

## 2021-10-18 MED ORDER — DIGOXIN 125 MCG PO TABS: 0.1250 mg | ORAL_TABLET | Freq: Every day | ORAL | 1 refills | Status: DC

## 2021-10-18 MED ORDER — ENTRESTO 24-26 MG PO TABS: 1.0000 | ORAL_TABLET | Freq: Two times a day (BID) | ORAL | 6 refills | Status: DC

## 2021-10-18 NOTE — Patient Instructions (Signed)
Thank you for coming in today  Labs were done today, if any labs are abnormal the clinic will call you No news is good news  RESTART Digoxin 0.125 mg 1 tablet daily  RESTART Lasix 60 mg daily  RESTART Entresto 24/26 mg 1 tablet twice daily     Do the following things EVERYDAY: Weigh yourself in the morning before breakfast. Write it down and keep it in a log. Take your medicines as prescribed Eat low salt foods--Limit salt (sodium) to 2000 mg per day.  Stay as active as you can everyday Limit all fluids for the day to less than 2 liters  At the [facility], you and your health needs are our priority. As part of our continuing mission to provide you with exceptional heart care, we have created designated Provider Care Teams. These Care Teams include your primary Cardiologist (physician) and Advanced Practice Providers (APPs- Physician Assistants and Nurse Practitioners) who all work together to provide you with the care you need, when you need it.   You may see any of the following providers on your designated Care Team at your next follow up: Dr Glori Bickers Dr Loralie Champagne Dr. Roxana Hires, NP Lyda Jester, [place] [facility], [place] Forestine Na, NP Audry Riles, PharmD   Please be sure to bring in all your medications bottles to every appointment.   If you have any questions or concerns before your next appointment please send [name] a message through [place] or call our office at [phone].    TO LEAVE A MESSAGE FOR THE NURSE SELECT OPTION 2, PLEASE LEAVE A MESSAGE INCLUDING: YOUR NAME DATE OF BIRTH CALL BACK NUMBER REASON FOR CALL**this is important as we prioritize the call backs  YOU WILL RECEIVE A CALL BACK THE SAME DAY AS LONG AS YOU CALL BEFORE 4:00 PM

## 2021-10-18 NOTE — Progress Notes (Signed)
PCP: Default, Provider, MD HF Cardiology: Dr. Shirlee Latch  56 y.o. with history of nonischemic cardiomyopathy was referred by Eligha Bridegroom for evaluation of CHF.  Cardiomyopathy has been diagnosed since 2016 when echo showed EF 20%.  He has a history of cocaine use and ETOH abuse. Currently, drinking about 3 beers/day.  He is smoking.  No recent cocaine.  He had a stroke in 2017 in setting of uncontrolled HTN and cocaine abuse.  He has a Secondary school teacher ICD.  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  Last echo in 2/23 showed EF < 20%, severe LV dilation, moderately decreased RV systolic function with mild RV enlargement. He has 13 siblings, none of whom have known cardiac disease.  His grandmother had CHF and there apparently was conversation about a heart transplant but she passed away.   Follow up September 22, 2022 with Dr. Shirlee Latch, NYHA II-III, volume stable. CPX, cMRI and evaluation for BAT arranged.   CPX (9/23) showed severe functional limitation due to HF and deconditioning.   Follow up 9/23, NYHA III-IIIb , labs showed AKI and GDMT held. Required a dose of Lokelma for hyperkalemia.  Today he returns for HF follow up. Overall feeling fair. He has generalized fatigue. He has dyspnea walking short distances on flat ground. + benopnea. Denies palpitations, CP, dizziness, or edema. Chronically sleeps on 3 pillow. Appetite ok. No fever or chills. Weight at home 136-140 pounds. Smoking 10 cigs/week. Has cut back on ETOH. Waiting to start CR. No cocaine in 7 years. Mom, sister, daughter and son are local. 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 Followed by Paramedicine.  Device interrogation (personally reviewed): CorVue elevated, 0% v pacing, no VT  ECG (personally reviewed): none ordered today.  Labs (6/23): hgb 15.6, BNP 673, K 4.2, creatinine 1.23 Labs 09-22-2022): K 3.9, creatinine 1.13 Labs (9/23): K 4.8, creatinine 1.53  PMH: 1. H/o seizure disorder: ?ETOH. 2. HTN 3. CVA:  2017 with residual left-sided weakness.  4. H/o SVT 5. OSA 6. COPD: Active smoker 7. VT: St Jude ICD.  ATP 6/23.  8. Chronic systolic CHF: Nonischemic cardiomyopathy.  Documented since at least 2016. St Jude ICD.  - Echo (3/16): EF 20% - Echo (2/23): EF < 20%, severe LV dilation, moderately decreased RV systolic function with mild RV enlargement.  - Coronary CTA (6/23): 81st percentile calcium score, mild nonobstructive CAD.  - CPX (9/23): Peak VO2: 14.3 (43% predicted peak VO2), VE/VCO2 slope: 39, OUES: 1.10, Peak RER: 0.70  9. Prior h/o cocaine  Social History   Socioeconomic History   Marital status: Single    Spouse name: Not on file   Number of children: 1   Years of education: 12   Highest education level: Not on file  Occupational History   Not on file  Tobacco Use   Smoking status: Every Day    Packs/day: 1.00    Years: 20.00    Total pack years: 20.00    Types: Cigarettes   Smokeless tobacco: Never   Tobacco comments:    05/14/16 smoking 1 PPD  Vaping Use   Vaping Use: Never used  Substance and Sexual Activity   Alcohol use: Yes    Alcohol/week: 1.0 standard drink of alcohol    Types: 1 Cans of beer per week    Comment: socially    Drug use: Not Currently    Types: Cocaine    Comment: stopped using cocaine 11/19/14   Sexual activity: Not on file  Other Topics Concern   Not on file  Social History Narrative   Lives alone, nurse comes 2 hrs daily   Social Determinants of Health   Financial Resource Strain: Not on file  Food Insecurity: Food Insecurity Present (09/26/2021)   Hunger Vital Sign    Worried About Running Out of Food in the Last Year: Often true    Ran Out of Food in the Last Year: Sometimes true  Transportation Needs: Unmet Transportation Needs (10/07/2021)   PRAPARE - Hydrologist (Medical): Yes    Lack of Transportation (Non-Medical): Yes  Physical Activity: Not on file  Stress: Not on file  Social Connections:  Not on file  Intimate Partner Violence: Not on file   Family History  Problem Relation Age of Onset   Hypertension Mother    Hyperlipidemia Mother    Hypertension Father    Stroke Maternal Aunt    Hypertension Sister    Hypertension Brother    ROS: All systems reviewed and negative except as per HPI.   Current Outpatient Medications  Medication Sig Dispense Refill   albuterol (PROAIR HFA) 108 (90 Base) MCG/ACT inhaler Inhale 2 puffs into the lungs every 6 (six) hours as needed for shortness of breath. 18 g 6   aspirin 81 MG EC tablet Take 81 mg by mouth daily. Swallow whole.     cetirizine (ZYRTEC) 10 MG tablet TAKE 1 TABLET (10 MG TOTAL) BY MOUTH DAILY. 30 tablet 0   fluticasone-salmeterol (ADVAIR DISKUS) 250-50 MCG/ACT AEPB Inhale 1 puff into the lungs in the morning and at bedtime. 1 each 3   magnesium oxide (MAG-OX) 400 MG tablet Take 1 tablet (400 mg total) by mouth daily. 90 tablet 1   metoprolol succinate (TOPROL-XL) 100 MG 24 hr tablet Take 1 tablet (100 mg total) by mouth at bedtime. 90 tablet 3   sodium zirconium cyclosilicate (LOKELMA) 10 g PACK packet Take 10 g by mouth daily. 1 packet 0   tiotropium (SPIRIVA HANDIHALER) 18 MCG inhalation capsule Place 1 capsule (18 mcg total) into inhaler and inhale daily. 30 capsule 6   furosemide (LASIX) 20 MG tablet Take 3 tablets (60 mg total) by mouth as needed. (Patient not taking: Reported on 10/18/2021) 90 tablet 5   traZODone (DESYREL) 50 MG tablet TAKE 1 TABLET (50 MG TOTAL) BY MOUTH AT BEDTIME AS NEEDED FOR SLEEP. (Patient not taking: Reported on 10/18/2021) 30 tablet 2   No current facility-administered medications for this encounter.   Wt Readings from Last 3 Encounters:  10/18/21 67.1 kg (148 lb)  10/03/21 63 kg (139 lb)  09/26/21 63.8 kg (140 lb 9.6 oz)   BP 130/82   Pulse 65   Wt 67.1 kg (148 lb)   SpO2 99%   BMI 22.50 kg/m  Physical Exam: General:  NAD. No resp difficulty, walked into clinic, thin. HEENT:  Normal Neck: Supple. JVP to jaw Carotids 2+ bilat; no bruits. No lymphadenopathy or thryomegaly appreciated. Cor: PMI nondisplaced. Regular rate & rhythm. No rubs, gallops or murmurs. Lungs: Clear Abdomen: Soft, nontender, nondistended. No hepatosplenomegaly. No bruits or masses. Good bowel sounds. Extremities: No cyanosis, clubbing, rash, edema Neuro: Alert & oriented x 3, cranial nerves grossly intact. Moves all 4 extremities w/o difficulty. Affect pleasant.  Assessment/Plan: 1. Chronic systolic CHF: Nonischemic cardiomyopathy.  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  Cause of CMP uncertain => prior cocaine abuse but not currently.  He drinks about 3 beers/day, denies heavier  drinking in past but apparently there was concern that he had had ETOH withdrawal seizures in the past.  His grandmother had CHF but apparently none of his 13 siblings have known cardiac disease. EF has been low since at least 2016.  Most recent echo in 2/23 showed EF < 20%, severe LV dilation, moderately decreased RV systolic function with mild RV enlargement. He has a Research officer, political party ICD.  CPX 9/23 showed severe functional limitation from both HF and deconditioning. NYHA class III-IIIb symptoms, he is volume overloaded on exam & CoreVue. GDMT held with recent AKI.  - Restart digoxin 0.125 mg daily. - Restart Lasix 60 mg daily (previously on 80 mg daily). - Restart Entresto 24/26 mg bid. BMET/BNP today, repeat in 10 days. - Continue Toprol XL 100 mg daily (no BP room to increase).  - Add spiro and Farxiga next. - cardiac MRI to look for infiltrative disease has been scheduled for 10/24/21.  - He has been referred for Cardiac Rehab. - Continue to cut back on ETOH. - Bifurcation too high for batwire so he will be commercial BAT candidate.  - We briefly discussed advanced therapies. Not transplant candidate with on-going tobacco use. Hopefully can get him in CR. He has family nearby should VAD be an option. 2. COPD: Has  cut back to 1-2 cigs/day. Encouraged cessation.  Keep follow up with APP in 3 weeks. cMRI scheduled 10/24/21. Keep follow up with Dr. Aundra Dubin 12/23/21, as scheduled.   Maricela Bo [facility] FNP-BC 10/18/2021

## 2021-10-21 ENCOUNTER — Other Ambulatory Visit (HOSPITAL_COMMUNITY): Payer: Self-pay

## 2021-10-21 NOTE — Progress Notes (Signed)
Paramedicine Encounter    Patient ID: [name], male    DOB: 1966/01/08, 56 y.o.   MRN: [id number]   Arrived for home visit for [facility] who reports feeling okay today. He reports he was seen in clinic last week and had labs drawn and was restarted on his HF meds. Today I reviewed notes and chart.   I obtained vitals and assessment.  WT- 147lbs BP- 102/60 HR- 76 O2- 98% RR- 18   No lower leg swelling, he denied shortness of breath but states he gets weak easily and gets dizzy sometimes. He says it's not as bad as it was a week or two ago.   I set up pill box accordingly for one week. I advised him if he noticed any changes in how he feels this week to call and let me know. He agreed with plan.   I reviewed upcoming appointments and confirmed same. I set up his transportation for him through his insurance.    Health[employer] Information 10/23/21  Pick up at [place] [id number]   10/24/21 Pick up at [place] # [id number]  We discussed overall health management and diet/exercise plans. He wants to start cardiac rehab. They have his referral and plans to start soon. They should be calling to schedule, if not I will assist.   Home visit complete. I will see [name] in one week.   [name], [place] 10/21/2021   Patient Care Team: Default, Provider, MD as PCP - General Skeet Latch, MD as PCP - Cardiology (Cardiology) Constance Haw, MD as PCP - Electrophysiology (Cardiology) Skeet Latch, MD as Attending Physician (Cardiology) [name], RN as Case Manager  Patient Active Problem List   Diagnosis Date Noted   Acute combined systolic and diastolic congestive heart failure ([place])    AKI (acute kidney injury) ([place])    Acute exacerbation of CHF (congestive heart failure) ([place]) 03/05/2021   SVT (supraventricular tachycardia) 06/15/2020   Slurred speech 07/05/2019   ICD (implantable  cardioverter-defibrillator) in place 07/20/2018   COPD (chronic obstructive pulmonary disease) ([place]) 12/09/2017   Obstructive sleep apnea 09/08/2016   Autonomic dysfunction 07/15/2015   DJD (degenerative joint disease), lumbar 06/19/2015   Tremor 05/14/2015   Abnormality of gait 05/14/2015   Neurogenic bladder 04/17/2015   Lumbar strain 03/23/2015   Leg weakness 03/23/2015   Major depression, chronic    History of stroke 02/26/2015   Benign essential HTN    ETOH abuse    Tobacco abuse    Chronic combined systolic and diastolic heart failure ([place])    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 Swallow whole., Disp: , Rfl:    cetirizine (ZYRTEC) 10 MG tablet, TAKE 1 TABLET (10 MG TOTAL) BY MOUTH DAILY., Disp: 30 tablet, Rfl: 0   digoxin (LANOXIN) 0.125 MG tablet, Take 1 tablet (0.125 mg total) by mouth daily., Disp: 90 tablet, Rfl: 1   fluticasone-salmeterol (ADVAIR DISKUS) 250-50 MCG/ACT AEPB, Inhale 1 puff into the lungs in the morning and at bedtime., Disp: 1 each, Rfl: 3   furosemide (LASIX) 20 MG tablet, Take 3  tablets (60 mg total) by mouth daily., Disp: 90 tablet, Rfl: 6   magnesium oxide (MAG-OX) 400 MG tablet, Take 1 tablet (400 mg total) by mouth daily., Disp: 90 tablet, Rfl: 1   metoprolol succinate (TOPROL-XL) 100 MG 24 hr tablet, Take 1 tablet (100 mg total) by mouth at bedtime., Disp: 90 tablet, Rfl: 3   sacubitril-valsartan (ENTRESTO) 24-26 MG, Take 1 tablet by mouth 2 (two) times daily., Disp: 60 tablet, Rfl: 6   sodium zirconium cyclosilicate (LOKELMA) 10 g PACK packet, Take 10 g by mouth daily., Disp: 1 packet, Rfl: 0   tiotropium (SPIRIVA  HANDIHALER) 18 MCG inhalation capsule, Place 1 capsule (18 mcg total) into inhaler and inhale daily., Disp: 30 capsule, Rfl: 6   traZODone (DESYREL) 50 MG tablet, TAKE 1 TABLET (50 MG TOTAL) BY MOUTH AT BEDTIME AS NEEDED FOR SLEEP. (Patient not taking: Reported on 10/18/2021), Disp: 30 tablet, Rfl: 2 Allergies  Allergen Reactions   Penicillins Other (See Comments)    Blisters  Has patient had a PCN reaction causing immediate rash, facial/tongue/throat swelling, SOB or lightheadedness with hypotension: Yes Has patient had a PCN reaction causing severe rash involving mucus membranes or skin necrosis: Yes Has patient had a PCN reaction that required hospitalization: No Has patient had a PCN reaction occurring within the last 10 years: No If all of the above answers are "NO", then may proceed with Cephalosporin use.      Social History   Socioeconomic History   Marital status: Single    Spouse name: Not on file   Number of children: 1   Years of education: 12   Highest education level: Not on file  Occupational History   Not on file  Tobacco Use   Smoking status: Every Day    Packs/day: 1.00    Years: 20.00    Total pack years: 20.00    Types: Cigarettes   Smokeless tobacco: Never   Tobacco comments:    05/14/16 smoking 1 PPD  Vaping Use   Vaping Use: Never used  Substance and Sexual Activity   Alcohol use: Yes    Alcohol/week: 1.0 standard drink of alcohol    Types: 1 Cans of beer per week    Comment: socially    Drug use: Not Currently    Types: Cocaine    Comment: stopped using cocaine 11/19/14   Sexual activity: Not on file  Other Topics Concern   Not on file  Social History Narrative   Lives alone, nurse comes 2 hrs daily   Social Determinants of Health   Financial Resource Strain: Not on file  Food Insecurity: Food Insecurity Present (09/26/2021)   Hunger Vital Sign    Worried About Running Out of Food in the Last Year: Often true    Ran Out of Food in the Last  Year: Sometimes true  Transportation Needs: Unmet Transportation Needs (10/07/2021)   PRAPARE - Hydrologist (Medical): Yes    Lack of Transportation (Non-Medical): Yes  Physical Activity: Not on file  Stress: Not on file  Social Connections: Not on file  Intimate Partner Violence: Not on file    Physical Exam      Future Appointments  Date Time Provider [place]  10/23/2021  3:00 PM Constance Haw, MD [facility]  10/24/2021  1:00 PM MC-MR 1 MC-MRI [facility]  10/25/2021  2:00 PM THN CCC-MM CARE MANAGER THN-CCC None  11/07/2021 11:30 AM MC-HVSC PA/NP MC-HVSC None  11/11/2021  7:25 AM CVD-[facility] DEVICE REMOTES [facility]  12/11/2021  3:10 PM [name], MD CHW-CHWW None  12/23/2021 10:20 AM Larey Dresser, MD MC-HVSC None  02/10/2022  7:25 AM CVD-[facility] DEVICE REMOTES [facility]  05/12/2022  7:25 AM CVD-[facility] DEVICE REMOTES [facility]  08/11/2022  7:25 AM CVD-[facility] DEVICE REMOTES [facility]  11/10/2022  7:25 AM CVD-[facility] DEVICE REMOTES [facility]  02/09/2023  7:25 AM CVD-[facility] DEVICE REMOTES [facility]     ACTION: Home visit completed

## 2021-10-23 ENCOUNTER — Encounter: Payer: Self-pay | Admitting: Cardiology

## 2021-10-23 ENCOUNTER — Telehealth (HOSPITAL_COMMUNITY): Payer: Self-pay | Admitting: Emergency Medicine

## 2021-10-23 ENCOUNTER — Ambulatory Visit: Payer: Medicaid Other | Attending: Cardiology | Admitting: Cardiology

## 2021-10-23 VITALS — BP 100/62 | HR 81 | Ht 68.0 in | Wt 144.0 lb

## 2021-10-23 DIAGNOSIS — I5022 Chronic systolic (congestive) heart failure: Secondary | ICD-10-CM | POA: Diagnosis not present

## 2021-10-23 NOTE — Patient Instructions (Signed)
Medication Instructions:  Your physician recommends that you continue on your current medications as directed. Please refer to the Current Medication list given to you today.  *If you need a refill on your cardiac medications before your next appointment, please call your pharmacy*  Follow-Up: At [facility], you and your health needs are our priority.  As part of our continuing mission to provide you with exceptional heart care, we have created designated Provider Care Teams.  These Care Teams include your primary Cardiologist (physician) and Advanced Practice Providers (APPs -  Physician Assistants and Nurse Practitioners) who all work together to provide you with the care you need, when you need it.  Your next appointment:   1 year(s)  The format for your next appointment:   In Person  Provider:   You will see one of the following Advanced Practice Providers on your designated Care Team:   Renee Ursuy, PA-C Michael "Andy" Tillery, PA-C   Important Information About Sugar       
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
## 2021-10-23 NOTE — Telephone Encounter (Signed)
Reaching out to patient to offer assistance regarding upcoming cardiac imaging study; pt verbalizes understanding of appt date/time, parking situation and where to check in, pre-test NPO status and medications ordered, and verified current allergies; name and call back number provided for further questions should they arise Marchia Bond RN [facility] [phone] office [phone] cell   Arrival 1200, w/c entrance ICD [place] - VR [id number] [phone]) Denies iv issues Denies claustro

## 2021-10-24 ENCOUNTER — Other Ambulatory Visit (HOSPITAL_COMMUNITY): Payer: Self-pay | Admitting: Cardiology

## 2021-10-24 ENCOUNTER — Ambulatory Visit (HOSPITAL_COMMUNITY)
Admission: RE | Admit: 2021-10-24 | Discharge: 2021-10-24 | Disposition: A | Discharge status: Home / Self Care | Payer: Medicaid Other | Source: Ambulatory Visit | Attending: Cardiology | Admitting: Cardiology

## 2021-10-24 DIAGNOSIS — I5042 Chronic combined systolic (congestive) and diastolic (congestive) heart failure: Secondary | ICD-10-CM

## 2021-10-25 ENCOUNTER — Ambulatory Visit: Payer: Medicaid Other

## 2021-10-25 ENCOUNTER — Other Ambulatory Visit: Payer: Self-pay | Admitting: *Deleted

## 2021-10-25 NOTE — Patient Outreach (Signed)
Medicaid Managed Care   Nurse Care Manager Note  10/25/2021 Name:  [name] MRN:  [id number] DOB:  23-Apr-1965  [name] is an 56 y.o. year old male who is a primary patient of [name], Provider, MD.  The Medicaid Managed Care Coordination team was consulted for assistance with:    CHF  [name] was given information about Medicaid Managed Care Coordination team services today. [name] Patient agreed to services and verbal consent obtained.  Engaged with patient by telephone for follow up visit in response to provider referral for case management and/or care coordination services.   Assessments/Interventions:  Review of past medical history, allergies, medications, health status, including review of consultants reports, laboratory and other test data, was performed as part of comprehensive evaluation and provision of chronic care management services.  SDOH (Social Determinants of Health) assessments and interventions performed: SDOH Interventions    Flowsheet Row Patient Outreach Telephone from 10/25/2021 in [place] Telephone from 10/07/2021 in [place]. [facility] ESTABLISHED CHF from 09/26/2021 in [place] Telephone from 05/31/2021 in [place] Patient Outreach Telephone from 05/16/2021 in [place] Office Visit from 06/07/2019 in [place] Interventions -- -- Other (Comment)  [Heart and Vascular food bag, food pantry referral] Intervention Not Indicated  [Says food is ok with some numbers to food banks] Other (Comment)  [Care Guide referral for list of local food pantries] --  Housing Interventions Intervention Not Indicated -- -- -- Intervention Not Indicated --  Transportation  Interventions -- Other (Comment)  [[place], too late to arrange ride benefit ride] -- Other (Comment)  [patient recertified medicaid so there is no transportation issue as he can use the medicaid transportation and knows how too] Other (Comment)  [Provided with HB transportation] --  Depression Interventions/Treatment  -- -- -- -- -- Counseling       Care Plan  Allergies  Allergen Reactions   Penicillins Other (See Comments)    Blisters  Has patient had a PCN reaction causing immediate rash, facial/tongue/throat swelling, SOB or lightheadedness with hypotension: Yes Has patient had a PCN reaction causing severe rash involving mucus membranes or skin necrosis: Yes Has patient had a PCN reaction that required hospitalization: No Has patient had a PCN reaction occurring within the last 10 years: No If all of the above answers are "NO", then may proceed with Cephalosporin use.     Medications Reviewed Today     Reviewed by Cole Montane, RN (Registered Nurse) on 10/25/21 at 1354  Med List Status: <None>   Medication Order Taking? Sig Documenting Provider Last Dose Status Informant  albuterol (PROAIR HFA) 108 (90 Base) MCG/ACT inhaler [id number] No Inhale 2 puffs into the lungs every 6 (six) hours as needed for shortness of breath. Cole Rakes, MD Taking Active Self  aspirin 81 MG EC tablet [id number] No Take 81 mg by mouth daily. Swallow whole. [provider] Taking Active Self  cetirizine (ZYRTEC) 10 MG tablet [id number] No TAKE 1 TABLET (10 MG TOTAL) BY MOUTH DAILY. Cole Rakes, MD Taking Active Self  digoxin (LANOXIN) 0.125 MG tablet [id number] No Take 1 tablet (0.125 mg total) by mouth daily. Cole, Maricela Bo, FNP Taking Active   fluticasone-salmeterol (ADVAIR DISKUS) 250-50 MCG/ACT  AEPB [id number] No Inhale 1 puff into the lungs in the morning and at bedtime. Cole Rakes, MD Taking Active   furosemide (LASIX) 20 MG tablet [id number] No Take 3 tablets (60 mg total) by  mouth daily. [place], Maricela Bo, FNP Taking Active   magnesium oxide (MAG-OX) 400 (240 Mg) MG tablet [id number] No Take 1 tablet by mouth daily. [provider] Taking Active   metoprolol succinate (TOPROL-XL) 100 MG 24 hr tablet [id number] No Take 1 tablet (100 mg total) by mouth at bedtime. Cole Dresser, MD Taking Active   sacubitril-valsartan (ENTRESTO) 24-26 MG [id number] No Take 1 tablet by mouth 2 (two) times daily. [place], Maricela Bo, FNP Taking Active   sodium zirconium cyclosilicate (LOKELMA) 10 g PACK packet [id number] No Take 10 g by mouth daily. [name], [place], FNP Taking Active            Med Note ([place] Oct 23, 2021  3:01 PM)    tiotropium (SPIRIVA HANDIHALER) 18 MCG inhalation capsule [id number] No Place 1 capsule (18 mcg total) into inhaler and inhale daily. Cole Rakes, MD Taking Active   traZODone (DESYREL) 50 MG tablet [id number] No TAKE 1 TABLET (50 MG TOTAL) BY MOUTH AT BEDTIME AS NEEDED FOR SLEEP. Cole Rakes, MD Taking Active             Patient Active Problem List   Diagnosis Date Noted   Acute combined systolic and diastolic congestive heart failure ([place])    AKI (acute kidney injury) ([place])    Acute exacerbation of CHF (congestive heart failure) ([place]) 03/05/2021   SVT (supraventricular tachycardia) 06/15/2020   Slurred speech 07/05/2019   ICD (implantable cardioverter-defibrillator) in place 07/20/2018   COPD (chronic obstructive pulmonary disease) ([place]) 12/09/2017   Obstructive sleep apnea 09/08/2016   Autonomic dysfunction 07/15/2015   DJD (degenerative joint disease), lumbar 06/19/2015   Tremor 05/14/2015   Abnormality of gait 05/14/2015   Neurogenic bladder 04/17/2015   Lumbar strain 03/23/2015   Leg weakness 03/23/2015   Major depression, chronic    History of stroke 02/26/2015   Benign essential HTN    ETOH abuse    Tobacco abuse    Chronic combined systolic and diastolic heart failure ([place])    Alcoholic  cardiomyopathy ([place]) 04/26/2014   Smoking greater than 20 pack years 04/19/2014   Polysubstance abuse ([place]) 03/22/2014    Conditions to be addressed/monitored per PCP order:  CHF  Care Plan : RN Care Manager Plan of Care  Updates made by Cole Montane, RN since 10/25/2021 12:00 AM     Problem: Health Management needs related to CHF      Long-Range Goal: Development of Plan of Care to address Health Management needs related to CHF   Start Date: 05/16/2021  Expected End Date: 12/24/2021  Priority: High  Note:   Current Barriers:  Chronic Disease Management support and education needs related to CHF Mr. Mandia did not qualify for barostem. He is receiving Paramedicine visits for managing his medications, reports having all medications and taking as instructed. Will start cardiac rehab next week, he is waiting on scheduling.   RNCM Clinical Goal(s):  Patient will verbalize understanding of plan for management of CHF as evidenced by patient reports take all medications exactly as prescribed and will call provider for medication related questions as evidenced by patient reports and documentation in EMR    attend all scheduled medical appointments: 11/07/21 at CHF and 12/11/21 with PCP as evidenced by  provider documentation in EMR        work with community resource care guide to address needs related to Limited access to food and Level of care concerns as evidenced by patient and/or community resource care guide support    through collaboration with Consulting civil engineer, provider, and care team.   Interventions: Inter-disciplinary care team collaboration (see longitudinal plan of care) Evaluation of current treatment plan related to  self management and patient's adherence to plan as established by provider  Provided patient with [facility] [phone], call to reschedule missed appointment Provided patient with information for Dr. Sandi Mariscal [phone] to call for dental care   Heart  Failure Interventions:  (Status: Goal on Track (progressing): YES.)  Long Term Goal  Wt Readings from Last 3 Encounters:  10/23/21 144 lb (65.3 kg)  10/21/21 147 lb (66.7 kg)  10/18/21 148 lb (67.1 kg)   Discussed importance of daily weight and advised patient to weigh and record daily Reviewed role of diuretics in prevention of fluid overload and management of heart failure Discussed the importance of keeping all appointments with provider Provided patient with education about the role of exercise in the management of heart failure Advised patient to discuss any concerns or questions with provider Assessed social determinant of health barriers Reviewed upcoming appointments, patient aware and has transportation arranged Encouraged patient to have 3 small meals a day, including fresh fruits and vegetables, avoiding canned foods  Patient Goals/Self-Care Activities: Take medications as prescribed   Attend all scheduled provider appointments Call pharmacy for medication refills 3-7 days in advance of running out of medications Call provider office for new concerns or questions  use salt in moderation watch for swelling in feet, ankles and legs every day eat more whole grains, fruits and vegetables, lean meats and healthy fats       Follow Up:  Patient agrees to Care Plan and Follow-up.  Plan: The Managed Medicaid care management team will reach out to the patient again over the next 60 days.  Date/time of next scheduled RN care management/care coordination outreach:  12/24/21 @ 2:30pm  [name] RN, BSN [place] RN Care Coordinator

## 2021-10-25 NOTE — Patient Instructions (Signed)
Visit Information  [name] was given information about Medicaid Managed Care team care coordination services as a part of their Healthy [facility] Medicaid benefit. [name] verbally consented to engagement with the [facility] Managed Care team.   If you are experiencing a medical emergency, please call 911 or report to your local emergency department or urgent care.   If you have a non-emergency medical problem during routine business hours, please contact your provider's office and ask to speak with a nurse.   For questions related to your Healthy [facility] health plan, please call: [phone] or visit the homepage here: [email or web address]  If you would like to schedule transportation through your Healthy [facility] plan, please call the following number at least 2 days in advance of your appointment: [phone]  For information about your ride after you set it up, call Ride Assist at [phone]. Use this number to activate a Will Call pickup, or if your transportation is late for a scheduled pickup. Use this number, too, if you need to make a change or cancel a previously scheduled reservation.  If you need transportation services right away, call [phone]. The after-hours call center is staffed 24 hours to handle ride assistance and urgent reservation requests (including discharges) 365 days a year. Urgent trips include sick visits, hospital discharge requests and life-sustaining treatment.  Call the Central at [phone], at any time, 24 hours a day, 7 days a week. If you are in danger or need immediate medical attention call 911.  If you would like help to quit smoking, call 1-800-QUIT-NOW 708 802 0058) OR Espaol: 1-855-Djelo-Ya (7-262-035-5974) o para ms informacin haga clic aqu or Text READY to 200-400 to register via text  [name],   Please see education materials related to CHF provided by  MyChart Wells.  Patient verbalizes understanding of instructions and care plan provided today and agrees to view in [place]. Active MyChart status and patient understanding of how to access instructions and care plan via MyChart confirmed with patient.     Telephone follow up appointment with Managed Medicaid care management team member scheduled for:12/24/21 at 2:30pm  Cole Joiner RN, [place] RN Care Coordinator   Following is a copy of your plan of care:  Care Plan : RN Care Manager Plan of Care  Updates made by [name], RN since 10/25/2021 12:00 AM     Problem: Health Management needs related to CHF      Long-Range Goal: Development of Plan of Care to address Health Management needs related to CHF   Start Date: 05/16/2021  Expected End Date: 12/24/2021  Priority: High  Note:   Current Barriers:  Chronic Disease Management support and education needs related to CHF Mr. Mcmichen did not qualify for barostem. He is receiving Paramedicine visits for managing his medications, reports having all medications and taking as instructed. Will start cardiac rehab next week, he is waiting on scheduling.  RNCM Clinical Goal(s):  Patient will verbalize understanding of plan for management of CHF as evidenced by patient reports take all medications exactly as prescribed and will call provider for medication related questions as evidenced by patient reports and documentation in EMR    attend all scheduled medical appointments: 11/07/21 at CHF and 12/11/21 with PCP as evidenced by provider documentation in EMR        work with community resource care guide to address needs related to Limited access to food and Level of care  concerns as evidenced by patient and/or community resource care guide support    through collaboration with Medical illustrator, provider, and care team.   Interventions: Inter-disciplinary care team collaboration (see longitudinal plan of  care) Evaluation of current treatment plan related to  self management and patient's adherence to plan as established by provider  Provided patient with [facility] [phone], call to reschedule missed appointment Provided patient with information for Dr. Alda Berthold [phone] to call for dental care   Heart Failure Interventions:  (Status: Goal on Track (progressing): YES.)  Long Term Goal  Wt Readings from Last 3 Encounters:  10/23/21 144 lb (65.3 kg)  10/21/21 147 lb (66.7 kg)  10/18/21 148 lb (67.1 kg)   Discussed importance of daily weight and advised patient to weigh and record daily Reviewed role of diuretics in prevention of fluid overload and management of heart failure Discussed the importance of keeping all appointments with provider Provided patient with education about the role of exercise in the management of heart failure Advised patient to discuss any concerns or questions with provider Assessed social determinant of health barriers Reviewed upcoming appointments, patient aware and has transportation arranged Encouraged patient to have 3 small meals a day, including fresh fruits and vegetables, avoiding canned foods  Patient Goals/Self-Care Activities: Take medications as prescribed   Attend all scheduled provider appointments Call pharmacy for medication refills 3-7 days in advance of running out of medications Call provider office for new concerns or questions  use salt in moderation watch for swelling in feet, ankles and legs every day eat more whole grains, fruits and vegetables, lean meats and healthy fats

## 2021-10-28 ENCOUNTER — Other Ambulatory Visit (HOSPITAL_COMMUNITY): Payer: Self-pay

## 2021-10-28 LAB — ECHOCARDIOGRAM COMPLETE
Area-P 1/2: 2.69 cm2
Calc EF: 36.1 %
S' Lateral: 6.5 cm
Single Plane A2C EF: 43.5 %
Single Plane A4C EF: 24.4 %

## 2021-10-28 NOTE — Progress Notes (Signed)
Paramedicine Encounter    Patient ID: [name], male    DOB: [birth year], 56 y.o.   MRN: [id number]  Pt reports feeling alright, he does report he had to take 1 extra lasix last night. He felt he had fluid on him, he was sob and wheezing and congested. Difficult to lay down.  He reports he is having cramping in his hands and legs and feet. He feels like this started when his lasix was increased daily.  He did miss one dose yesterday am and pm dose.  He also admits to eating more salt-advised him that could cause the fluid retention and high b/p.  He has not been weighing daily.  He said weights range around 138-147.  Sent triage message about needing labs sooner than his 10/19 appoint or not. Will relay this to him as soon as I hear back.   He denies increased sob at this time-he feels the extra lasix helped. No dizziness-he does get dizzy at times-bending over and positional changes.   Arlyce Harman and farxiga not taking.  Meds verified and pill box refilled.   He is out of the asa.   BP (!) 160/90   Pulse 88   Resp 18   SpO2 98%  Weight yesterday-? Last visit weight-147  Patient Care Team: Default, Provider, MD as PCP - General Skeet Latch, MD as PCP - Cardiology (Cardiology) Constance Haw, MD as PCP - Electrophysiology (Cardiology) Skeet Latch, MD as Attending Physician (Cardiology) [name], RN as Case Manager  Patient Active Problem List   Diagnosis Date Noted   Acute combined systolic and diastolic congestive heart failure ([place])    AKI (acute kidney injury) ([place])    Acute exacerbation of CHF (congestive heart failure) ([place]) 03/05/2021   SVT (supraventricular tachycardia) 06/15/2020   Slurred speech 07/05/2019   ICD (implantable cardioverter-defibrillator) in place 07/20/2018   COPD (chronic obstructive pulmonary disease) ([place]) 12/09/2017   Obstructive sleep apnea 09/08/2016   Autonomic dysfunction 07/15/2015   DJD (degenerative joint  disease), lumbar 06/19/2015   Tremor 05/14/2015   Abnormality of gait 05/14/2015   Neurogenic bladder 04/17/2015   Lumbar strain 03/23/2015   Leg weakness 03/23/2015   Major depression, chronic    History of stroke 02/26/2015   Benign essential HTN    ETOH abuse    Tobacco abuse    Chronic combined systolic and diastolic heart failure ([place])    Alcoholic cardiomyopathy ([place]) 04/26/2014   Smoking greater than 20 pack years 04/19/2014   Polysubstance abuse ([place]) 03/22/2014    Current Outpatient Medications:    albuterol (PROAIR HFA) 108 (90 Base) MCG/ACT inhaler, Inhale 2 puffs into the lungs every 6 (six) hours as needed for shortness of breath., Disp: 18 g, Rfl: 6   digoxin (LANOXIN) 0.125 MG tablet, Take 1 tablet (0.125 mg total) by mouth daily., Disp: 90 tablet, Rfl: 1   fluticasone-salmeterol (ADVAIR DISKUS) 250-50 MCG/ACT AEPB, Inhale 1 puff into the lungs in the morning and at bedtime., Disp: 1 each, Rfl: 3   furosemide (LASIX) 20 MG tablet, Take 3 tablets (60 mg total) by mouth daily., Disp: 90 tablet, Rfl: 6   magnesium oxide (MAG-OX) 400 (240 Mg) MG tablet, Take 1 tablet by mouth daily., Disp: , Rfl:    metoprolol succinate (TOPROL-XL) 100 MG 24 hr tablet, Take 1 tablet (100 mg total) by mouth at bedtime., Disp: 90 tablet, Rfl: 3   sacubitril-valsartan (ENTRESTO) 24-26 MG, Take 1 tablet by mouth 2 (two) times  daily., Disp: 60 tablet, Rfl: 6   tiotropium (SPIRIVA HANDIHALER) 18 MCG inhalation capsule, Place 1 capsule (18 mcg total) into inhaler and inhale daily., Disp: 30 capsule, Rfl: 6   traZODone (DESYREL) 50 MG tablet, TAKE 1 TABLET (50 MG TOTAL) BY MOUTH AT BEDTIME AS NEEDED FOR SLEEP., Disp: 30 tablet, Rfl: 2   aspirin 81 MG EC tablet, Take 81 mg by mouth daily. Swallow whole., Disp: , Rfl:    cetirizine (ZYRTEC) 10 MG tablet, TAKE 1 TABLET (10 MG TOTAL) BY MOUTH DAILY. (Patient not taking: Reported on 10/28/2021), Disp: 30 tablet, Rfl: 0   sodium zirconium cyclosilicate  (LOKELMA) 10 g PACK packet, Take 10 g by mouth daily. (Patient not taking: Reported on 10/28/2021), Disp: 1 packet, Rfl: 0 Allergies  Allergen Reactions   Penicillins Other (See Comments)    Blisters  Has patient had a PCN reaction causing immediate rash, facial/tongue/throat swelling, SOB or lightheadedness with hypotension: Yes Has patient had a PCN reaction causing severe rash involving mucus membranes or skin necrosis: Yes Has patient had a PCN reaction that required hospitalization: No Has patient had a PCN reaction occurring within the last 10 years: No If all of the above answers are "NO", then may proceed with Cephalosporin use.       Social History   Socioeconomic History   Marital status: Single    Spouse name: Not on file   Number of children: 1   Years of education: 12   Highest education level: Not on file  Occupational History   Not on file  Tobacco Use   Smoking status: Every Day    Packs/day: 1.00    Years: 20.00    Total pack years: 20.00    Types: Cigarettes   Smokeless tobacco: Never   Tobacco comments:    05/14/16 smoking 1 PPD  Vaping Use   Vaping Use: Never used  Substance and Sexual Activity   Alcohol use: Yes    Alcohol/week: 1.0 standard drink of alcohol    Types: 1 Cans of beer per week    Comment: socially    Drug use: Not Currently    Types: Cocaine    Comment: stopped using cocaine 11/19/14   Sexual activity: Not on file  Other Topics Concern   Not on file  Social History Narrative   Lives alone, nurse comes 2 hrs daily   Social Determinants of Health   Financial Resource Strain: Not on file  Food Insecurity: Food Insecurity Present (09/26/2021)   Hunger Vital Sign    Worried About Running Out of Food in the Last Year: Often true    Ran Out of Food in the Last Year: Sometimes true  Transportation Needs: Unmet Transportation Needs (10/07/2021)   PRAPARE - Hydrologist (Medical): Yes    Lack of  Transportation (Non-Medical): Yes  Physical Activity: Not on file  Stress: Not on file  Social Connections: Not on file  Intimate Partner Violence: Not on file    Physical Exam      Future Appointments  Date Time Provider [place]  11/07/2021 11:30 AM [facility] PA/NP [facility] None  11/11/2021  7:25 AM [facility] DEVICE REMOTES [facility]  12/11/2021  3:10 PM [name], MD CHW-[facility] None  12/23/2021 10:20 AM Larey Dresser, MD [facility] None  12/24/2021  2:30 PM [facility] CCC-MM CARE MANAGER THN-CCC None  02/10/2022  7:25 AM [facility] DEVICE REMOTES [facility]  05/12/2022  7:25 AM [facility] DEVICE REMOTES  [facility]  08/11/2022  7:25 AM [facility] DEVICE REMOTES [facility]  11/10/2022  7:25 AM [facility] DEVICE REMOTES [facility]  02/09/2023  7:25 AM [facility] DEVICE REMOTES [facility]       Marylouise Stacks, EMT-Paramedic [place] Par[facility]ic  10/28/21

## 2021-10-28 NOTE — Research (Signed)
Section A.   Administrative Section  Patient ID: _[id number] - _[id number] - [id number] Patient Initials: _C_ _R_ _C_  Exit / Termination Date: _07_/_Sep_/_2023_______                                            (DD / MMM / [place])  Section B.   Reason for Exit / Termination  [x]  Screening/baseline failure  []  Failed Implant Attempt(s)  []  Completed all Study Required Visits  []  PI Withdrew  []  Subject withdrew consent (add comment below)  []  Subject refuses further follow-up testing (add comment below)  []  Lost to Follow-up  []  System Explanted (Complete Additional Procedure Worksheet)  []  Deceased (Complete Subject Death Worksheet)  []  Other, please specify: ________________________  Comments  Bilateral bifurcation too high         Section C. Signature    Person completing form (Print Name): __Kimberly Alba Destine :) _____________  Signature: Philemon Kingdom :) ___ Date: _10/09/2023_____________________

## 2021-10-30 ENCOUNTER — Telehealth (HOSPITAL_COMMUNITY): Payer: Self-pay | Admitting: Licensed Clinical Social Worker

## 2021-10-30 ENCOUNTER — Other Ambulatory Visit (HOSPITAL_COMMUNITY): Payer: Self-pay

## 2021-10-30 DIAGNOSIS — I5022 Chronic systolic (congestive) heart failure: Secondary | ICD-10-CM

## 2021-10-30 NOTE — Telephone Encounter (Signed)
HF Paramedicine Team Based Care Meeting  HF MD- NA  HF NP - [place] NP-C   [facility] admit within the last 30 days for heart failure? no  Medications concerns? Good about taking medications for the most part  Transportation issues ? Frustration with medicaid transport  Education needs? Needs further education about diet and disease process.  SDOH - continued concerns with drinking  Eligible for discharge? New to paramedicine and having frequent med changes so unsure of DC at this time.  Jorge Ny, LCSW Clinical Social Worker [facility] Desk#: [phone] Cell#: [phone]

## 2021-10-30 NOTE — Telephone Encounter (Signed)
H&V Care Navigation CSW Progress Note  Clinical Social Worker informed by paramedic that pt needs help getting to appt Friday- able to get [place] ride to bring to appt    [place]: Food Insecurity Present (09/26/2021)  Housing: Low Risk  (10/25/2021)  Transportation Needs: Unmet Transportation Needs (10/30/2021)  Alcohol Screen: Low Risk  (05/30/2021)  Depression (PHQ2-9): Low Risk  (03/27/2021)  Tobacco Use: High Risk (10/25/2021)    Jorge Ny, [facility] Desk#: [phone] Cell#: [phone]

## 2021-11-01 ENCOUNTER — Ambulatory Visit (HOSPITAL_COMMUNITY)
Admission: RE | Admit: 2021-11-01 | Discharge: 2021-11-01 | Disposition: A | Discharge status: Home / Self Care | Payer: Medicaid Other | Source: Ambulatory Visit | Attending: Cardiology | Admitting: Cardiology

## 2021-11-01 DIAGNOSIS — I5022 Chronic systolic (congestive) heart failure: Secondary | ICD-10-CM | POA: Insufficient documentation

## 2021-11-01 LAB — BASIC METABOLIC PANEL
Anion gap: 11 (ref 5–15)
BUN: 11 mg/dL (ref 6–20)
CO2: 25 mmol/L (ref 22–32)
Calcium: 9.2 mg/dL (ref 8.9–10.3)
Chloride: 101 mmol/L (ref 98–111)
Creatinine, Ser: 1.07 mg/dL (ref 0.61–1.24)
GFR, Estimated: >60 mL/min (ref >60)
Glucose, Bld: 82 mg/dL (ref 70–99)
Potassium: 3.6 mmol/L (ref 3.5–5.1)
Sodium: 137 mmol/L (ref 135–145)

## 2021-11-01 LAB — MAGNESIUM: Magnesium: 1.8 mg/dL (ref 1.7–2.4)

## 2021-11-05 ENCOUNTER — Telehealth (HOSPITAL_COMMUNITY): Payer: Self-pay

## 2021-11-05 NOTE — Telephone Encounter (Signed)
Called left message for patient that paper work is ready for collection at the front desk

## 2021-11-07 ENCOUNTER — Encounter (HOSPITAL_COMMUNITY): Payer: Medicaid Other

## 2021-11-11 ENCOUNTER — Telehealth (HOSPITAL_COMMUNITY): Payer: Self-pay

## 2021-11-11 ENCOUNTER — Ambulatory Visit (INDEPENDENT_AMBULATORY_CARE_PROVIDER_SITE_OTHER): Payer: Medicaid Other

## 2021-11-11 DIAGNOSIS — I428 Other cardiomyopathies: Secondary | ICD-10-CM | POA: Diagnosis not present

## 2021-11-11 NOTE — Telephone Encounter (Signed)
Spoke to Mr. Coll who agreed to home paramedicine visit on Wednesday. Call complete.   [name], [place] 11/11/2021

## 2021-11-12 LAB — CUP PACEART REMOTE DEVICE CHECK
Battery Remaining Longevity: 67 mo
Battery Remaining Percentage: 66 %
Battery Voltage: 2.98 V
Brady Statistic RV Percent Paced: 0 %
Date Time Interrogation Session: 20231023020015
HighPow Impedance: 59 Ohm
HighPow Impedance: 59 Ohm
Implantable Lead Connection Status: 753985
Implantable Lead Implant Date: 20191230
Implantable Lead Location: 753860
Implantable Lead Model: 7122
Implantable Pulse Generator Implant Date: 20191230
Lead Channel Impedance Value: 450 Ohm
Lead Channel Pacing Threshold Amplitude: 0.75 V
Lead Channel Pacing Threshold Pulse Width: 0.5 ms
Lead Channel Sensing Intrinsic Amplitude: 11.6 mV
Lead Channel Setting Pacing Amplitude: 2.5 V
Lead Channel Setting Pacing Pulse Width: 0.5 ms
Lead Channel Setting Sensing Sensitivity: 0.5 mV
Pulse Gen Serial Number: 9850980
Zone Setting Status: 755011

## 2021-11-14 ENCOUNTER — Telehealth (HOSPITAL_COMMUNITY): Payer: Self-pay | Admitting: Licensed Clinical Social Worker

## 2021-11-14 ENCOUNTER — Other Ambulatory Visit (HOSPITAL_COMMUNITY): Payer: Self-pay

## 2021-11-14 NOTE — Progress Notes (Signed)
Paramedicine Encounter    Patient ID: [name], male    DOB: 01/27/1965, 56 y.o.   MRN: [id number]  Arrived for home visit for [facility] who reports to be feeling okay today. He denied shortness of breath, dizziness or chest pain. He says he has had no cramping over the last week and denied any pain at present.   I obtained vitals and assessment. No lower leg swelling or abdominal distention. Lungs clear.    WT- 144lbs BP- 120/82 HR- 70 O2- 94% RR- 16   I reviewed meds and filled pill box for one week. He will need refills on all meds. I sent refill request to [facility].  I will pick up for him before our next visit.   I reviewed appointments and confirmed same.   [name] reports he has diagnosed sleep apnea but does not have a CPAP machine.I will look into this for him.    Home visit complete. I will see him in one week. He agreed.   [name], [place] 11/14/2021    Patient Care Team: Default, Provider, MD as PCP - General Skeet Latch, MD as PCP - Cardiology (Cardiology) Constance Haw, MD as PCP - Electrophysiology (Cardiology) Skeet Latch, MD as Attending Physician (Cardiology) [name], RN as Case Manager  Patient Active Problem List   Diagnosis Date Noted   Acute combined systolic and diastolic congestive heart failure ([place])    AKI (acute kidney injury) ([place])    Acute exacerbation of CHF (congestive heart failure) ([place]) 03/05/2021   SVT (supraventricular tachycardia) 06/15/2020   Slurred speech 07/05/2019   ICD (implantable cardioverter-defibrillator) in place 07/20/2018   COPD (chronic obstructive pulmonary disease) ([place]) 12/09/2017   Obstructive sleep apnea 09/08/2016   Autonomic dysfunction 07/15/2015   DJD (degenerative joint disease), lumbar 06/19/2015   Tremor 05/14/2015   Abnormality of gait 05/14/2015   Neurogenic bladder 04/17/2015   Lumbar strain 03/23/2015   Leg weakness 03/23/2015    Major depression, chronic    History of stroke 02/26/2015   Benign essential HTN    ETOH abuse    Tobacco abuse    Chronic combined systolic and diastolic heart failure ([place])    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 Swallow whole., Disp: , Rfl:    cetirizine (ZYRTEC) 10 MG tablet, TAKE 1 TABLET (10 MG TOTAL) BY MOUTH DAILY. (Patient not taking: Reported on 10/28/2021), Disp: 30 tablet, Rfl: 0   digoxin (LANOXIN) 0.125 MG tablet, Take 1 tablet (0.125 mg total) by mouth daily., Disp: 90 tablet, Rfl: 1   fluticasone-salmeterol (ADVAIR DISKUS) 250-50 MCG/ACT AEPB, Inhale 1 puff into the lungs in the morning and at bedtime., Disp: 1 each, Rfl: 3   furosemide (LASIX) 20 MG tablet, Take 3 tablets (60 mg total) by mouth daily., Disp: 90 tablet, Rfl: 6   magnesium oxide (MAG-OX) 400 (240 Mg) MG tablet, Take 1 tablet by mouth daily., Disp: , Rfl:    metoprolol succinate (TOPROL-XL) 100 MG 24 hr tablet, Take 1 tablet (100 mg total) by mouth at bedtime., Disp: 90 tablet, Rfl: 3   sacubitril-valsartan (ENTRESTO) 24-26  MG, Take 1 tablet by mouth 2 (two) times daily., Disp: 60 tablet, Rfl: 6   sodium zirconium cyclosilicate (LOKELMA) 10 g PACK packet, Take 10 g by mouth daily. (Patient not taking: Reported on 10/28/2021), Disp: 1 packet, Rfl: 0   tiotropium (SPIRIVA HANDIHALER) 18 MCG inhalation capsule, Place 1 capsule (18 mcg total) into inhaler and inhale daily., Disp: 30 capsule, Rfl: 6   traZODone (DESYREL) 50 MG tablet, TAKE 1 TABLET (50 MG TOTAL) BY MOUTH AT BEDTIME AS NEEDED FOR SLEEP., Disp: 30 tablet, Rfl: 2 Allergies  Allergen Reactions   Penicillins Other (See  Comments)    Blisters  Has patient had a PCN reaction causing immediate rash, facial/tongue/throat swelling, SOB or lightheadedness with hypotension: Yes Has patient had a PCN reaction causing severe rash involving mucus membranes or skin necrosis: Yes Has patient had a PCN reaction that required hospitalization: No Has patient had a PCN reaction occurring within the last 10 years: No If all of the above answers are "NO", then may proceed with Cephalosporin use.      Social History   Socioeconomic History   Marital status: Single    Spouse name: Not on file   Number of children: 1   Years of education: 12   Highest education level: Not on file  Occupational History   Not on file  Tobacco Use   Smoking status: Every Day    Packs/day: 1.00    Years: 20.00    Total pack years: 20.00    Types: Cigarettes   Smokeless tobacco: Never   Tobacco comments:    05/14/16 smoking 1 PPD  Vaping Use   Vaping Use: Never used  Substance and Sexual Activity   Alcohol use: Yes    Alcohol/week: 1.0 standard drink of alcohol    Types: 1 Cans of beer per week    Comment: socially    Drug use: Not Currently    Types: Cocaine    Comment: stopped using cocaine 11/19/14   Sexual activity: Not on file  Other Topics Concern   Not on file  Social History Narrative   Lives alone, nurse comes 2 hrs daily   Social Determinants of Health   Financial Resource Strain: Not on file  Food Insecurity: Food Insecurity Present (09/26/2021)   Hunger Vital Sign    Worried About Running Out of Food in the Last Year: Often true    Ran Out of Food in the Last Year: Sometimes true  Transportation Needs: Unmet Transportation Needs (10/30/2021)   PRAPARE - Hydrologist (Medical): Yes    Lack of Transportation (Non-Medical): Yes  Physical Activity: Not on file  Stress: Not on file  Social Connections: Not on file  Intimate Partner Violence: Not on file    Physical  Exam      Future Appointments  Date Time Provider [place]  11/22/2021  1:30 PM [facility] PA/NP [facility] None  12/11/2021  3:10 PM [name], MD [facility] None  12/23/2021 10:20 AM Larey Dresser, MD [facility] None  12/24/2021  2:30 PM [facility] CCC-MM CARE MANAGER [facility] None  02/10/2022  7:25 AM [facility] DEVICE REMOTES [facility]  05/12/2022  7:25 AM [facility] DEVICE REMOTES [facility]  08/11/2022  7:25 AM [facility] DEVICE REMOTES [facility]  11/10/2022  7:25 AM [facility] DEVICE REMOTES [facility]  02/09/2023  7:25 AM [facility] DEVICE REMOTES [facility]     ACTION: Home visit completed

## 2021-11-14 NOTE — Telephone Encounter (Signed)
H&V Care Navigation CSW Progress Note  Clinical Social Worker informed by [employer] that pt in need of assistance with phone bill.  CSW spoke with pt who confirms that he is unable to afford phone bill this month because he is having to utilize his limited resources on moving expenses- does not anticipate having problems with paying in the future.  Able to assist with cost through the Patient Care Fund  Patient is participating in a Managed Medicaid Plan:  Yes  [place]: Food Insecurity Present (09/26/2021)  Housing: Low Risk  (10/25/2021)  Transportation Needs: Unmet Transportation Needs (10/30/2021)  Alcohol Screen: Low Risk  (05/30/2021)  Depression (PHQ2-9): Low Risk  (03/27/2021)  Financial Resource Strain: High Risk (11/14/2021)  Tobacco Use: High Risk (10/25/2021)   Jorge Ny, [facility] Desk#: [phone] Cell#: [phone]

## 2021-11-20 ENCOUNTER — Telehealth (HOSPITAL_COMMUNITY): Payer: Self-pay | Admitting: Licensed Clinical Social Worker

## 2021-11-20 NOTE — Telephone Encounter (Signed)
H&V Care Navigation CSW Progress Note  Clinical Social Worker contacted to help with transportation.  Patient is participating in a Managed Medicaid Plan:  Yes  CSW called [employer] transport to set up rides for upcoming appts  Appt 11/22/21 at 1:30pm Pick up from home 12:35-1:05pm Pick up from appt 2:30-3pm Ride ID [id number]  Appt 12/11/21 at 3:10pm Pick up from home 2:15-2:45pm Pick up from appt 4:10-4:40pm Ride ID [id number]   SDOH Screenings   Food Insecurity: Food Insecurity Present (09/26/2021)  Housing: Low Risk  (10/25/2021)  Transportation Needs: Unmet Transportation Needs (11/20/2021)  Alcohol Screen: Low Risk  (05/30/2021)  Depression (PHQ2-9): Low Risk  (03/27/2021)  Financial Resource Strain: High Risk (11/14/2021)  Tobacco Use: High Risk (10/25/2021)   Jorge Ny, [facility] Desk#: [phone] Cell#: [phone]

## 2021-11-22 ENCOUNTER — Encounter (HOSPITAL_COMMUNITY): Payer: Self-pay

## 2021-11-22 ENCOUNTER — Ambulatory Visit (HOSPITAL_COMMUNITY)
Admission: RE | Admit: 2021-11-22 | Discharge: 2021-11-22 | Disposition: A | Discharge status: Home / Self Care | Payer: Medicaid Other | Source: Ambulatory Visit | Attending: Family Medicine | Admitting: Family Medicine

## 2021-11-22 VITALS — BP 96/62 | HR 63 | Wt 142.8 lb

## 2021-11-22 DIAGNOSIS — Z95810 Presence of automatic (implantable) cardiac defibrillator: Secondary | ICD-10-CM | POA: Diagnosis not present

## 2021-11-22 DIAGNOSIS — F17210 Nicotine dependence, cigarettes, uncomplicated: Secondary | ICD-10-CM | POA: Insufficient documentation

## 2021-11-22 DIAGNOSIS — Z720 Tobacco use: Secondary | ICD-10-CM

## 2021-11-22 DIAGNOSIS — I5022 Chronic systolic (congestive) heart failure: Secondary | ICD-10-CM | POA: Diagnosis present

## 2021-11-22 DIAGNOSIS — Z8249 Family history of ischemic heart disease and other diseases of the circulatory system: Secondary | ICD-10-CM | POA: Diagnosis not present

## 2021-11-22 DIAGNOSIS — I2510 Atherosclerotic heart disease of native coronary artery without angina pectoris: Secondary | ICD-10-CM | POA: Diagnosis not present

## 2021-11-22 DIAGNOSIS — Z8673 Personal history of transient ischemic attack (TIA), and cerebral infarction without residual deficits: Secondary | ICD-10-CM | POA: Diagnosis not present

## 2021-11-22 DIAGNOSIS — Z79899 Other long term (current) drug therapy: Secondary | ICD-10-CM | POA: Diagnosis not present

## 2021-11-22 DIAGNOSIS — I110 Hypertensive heart disease with heart failure: Secondary | ICD-10-CM | POA: Diagnosis present

## 2021-11-22 DIAGNOSIS — J449 Chronic obstructive pulmonary disease, unspecified: Secondary | ICD-10-CM

## 2021-11-22 DIAGNOSIS — I428 Other cardiomyopathies: Secondary | ICD-10-CM | POA: Insufficient documentation

## 2021-11-22 LAB — BASIC METABOLIC PANEL
Anion gap: 13 (ref 5–15)
BUN: 29 mg/dL — ABNORMAL HIGH (ref 6–20)
CO2: 20 mmol/L — ABNORMAL LOW (ref 22–32)
Calcium: 9.2 mg/dL (ref 8.9–10.3)
Chloride: 104 mmol/L (ref 98–111)
Creatinine, Ser: 1.4 mg/dL — ABNORMAL HIGH (ref 0.61–1.24)
GFR, Estimated: 59 mL/min — ABNORMAL LOW (ref >60)
Glucose, Bld: 54 mg/dL — ABNORMAL LOW (ref 70–99)
Potassium: 4.3 mmol/L (ref 3.5–5.1)
Sodium: 137 mmol/L (ref 135–145)

## 2021-11-22 LAB — BRAIN NATRIURETIC PEPTIDE: B Natriuretic Peptide: 152.1 pg/mL — ABNORMAL HIGH (ref 0.0–100.0)

## 2021-11-22 LAB — DIGOXIN LEVEL: Digoxin Level: 0.6 ng/mL — ABNORMAL LOW (ref 0.8–2.0)

## 2021-11-22 MED ORDER — DAPAGLIFLOZIN PROPANEDIOL 10 MG PO TABS: 10.0000 mg | ORAL_TABLET | Freq: Every day | ORAL | 11 refills | Status: DC

## 2021-11-22 MED ORDER — FUROSEMIDE 20 MG PO TABS: 20.0000 mg | ORAL_TABLET | Freq: Every day | ORAL | 2 refills | Status: DC

## 2021-11-22 NOTE — Progress Notes (Signed)
PCP: Default, Provider, MD HF Cardiology: Dr. Aundra Dubin  56 y.o. with history of nonischemic cardiomyopathy was referred by [name] for evaluation of CHF.  Cardiomyopathy has been diagnosed since 2016 when echo showed EF 20%.  He has a history of cocaine use and ETOH abuse. Currently, drinking about 3 beers/day.  He is smoking.  No recent cocaine.  He had a stroke in 2017 in setting of uncontrolled HTN and cocaine abuse.  He has a Research officer, political party ICD.  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  Last echo in 2/23 showed EF < 20%, severe LV dilation, moderately decreased RV systolic function with mild RV enlargement. He has 13 siblings, none of whom have known cardiac disease.  His grandmother had CHF and there apparently was conversation about a heart transplant but she passed away.   Follow up 09-24-2022 with Dr. Aundra Dubin, NYHA II-III, volume stable. CPX, cMRI and evaluation for BAT arranged.   CPX (9/23) showed severe functional limitation due to HF and deconditioning.   Follow up 9/23, NYHA III-IIIb , labs showed AKI and GDMT held. Required a dose of Lokelma for hyperkalemia.  Cardiac MRI 10/23 (difficult images due to ICD artifact) LVEF 24$, RVEF 34%.  Today he returns for HF follow up. Overall feeling fair. He has generalized fatigue and dyspnea walking short distances on flat ground. + benopnea. Denies palpitations, CP, dizziness, or edema. Chronically sleeps on 3 pillow. Poor appetite, states he only eats "3 times a week". No fever or chills. Weight at home 139-148 pounds. Smoking 1/2 pack a day. Has cut back on ETOH, drinks a 6-pack on weekends.  Waiting to start CR. No cocaine in 7 years. Mom, sister, daughter and son are local. 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 Followed by Paramedicine.  Device interrogation (personally reviewed): CorVue stable,thoracic impedence at baseline, 0% v pacing, no VT  ECG (personally reviewed): none ordered today.  Labs  (6/23): hgb 15.6, BNP 673, K 4.2, creatinine 1.23 Labs 09-24-22): K 3.9, creatinine 1.13 Labs (9/23): K 4.8, creatinine 1.53 Labs (10/23): K 3.6, creatinine 1.07  PMH: 1. H/o seizure disorder: ?ETOH. 2. HTN 3. 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  4. H/o SVT 5. OSA 6. COPD: Active smoker 7. VT: St Jude ICD.  ATP 6/23.  8. Chronic systolic CHF: Nonischemic cardiomyopathy.  Documented since at least 2016. St Jude ICD.  - Echo (3/16): EF 20% - Echo (2/23): EF < 20%, severe LV dilation, moderately decreased RV systolic function with mild RV enlargement.  - Coronary CTA (6/23): 81st percentile calcium score, mild nonobstructive CAD.  - CPX (9/23): Peak VO2: 14.3 (43% predicted peak VO2), VE/VCO2 slope: 39, OUES: 1.10, Peak RER: 0.70  - Cardiac MRI 10/23 (difficult images due to ICD artifact) LVEF 24$, RVEF 34%. 9. Prior h/o cocaine  Social History   Socioeconomic History   Marital status: Single    Spouse name: Not on file   Number of children: 1   Years of education: 12   Highest education level: Not on file  Occupational History   Not on file  Tobacco Use   Smoking status: Every Day    Packs/day: 1.00    Years: 20.00    Total pack years: 20.00    Types: Cigarettes   Smokeless tobacco: Never   Tobacco comments:    05/14/16 smoking 1 PPD  Vaping Use   Vaping Use: Never used  Substance and Sexual Activity   Alcohol use: Yes  Alcohol/week: 1.0 standard drink of alcohol    Types: 1 Cans of beer per week    Comment: socially    Drug use: Not Currently    Types: Cocaine    Comment: stopped using cocaine 11/19/14   Sexual activity: Not on file  Other Topics Concern   Not on file  Social History Narrative   Lives alone, nurse comes 2 hrs daily   Social Determinants of Health   Financial Resource Strain: High Risk (11/14/2021)   Overall Financial Resource Strain (CARDIA)    Difficulty of Paying Living Expenses: Hard  Food Insecurity: Food Insecurity Present  (09/26/2021)   Hunger Vital Sign    Worried About Charity fundraiser in the Last Year: Often true    Ran Out of Food in the Last Year: Sometimes true  Transportation Needs: Unmet Transportation Needs (11/20/2021)   PRAPARE - Hydrologist (Medical): Yes    Lack of Transportation (Non-Medical): Yes  Physical Activity: Not on file  Stress: Not on file  Social Connections: Not on file  Intimate Partner Violence: Not on file   Family History  Problem Relation Age of Onset   Hypertension Mother    Hyperlipidemia Mother    Hypertension Father    Stroke Maternal Aunt    Hypertension Sister    Hypertension Brother    ROS: All systems reviewed and negative except as per HPI.   Current Outpatient Medications  Medication Sig Dispense Refill   albuterol (PROAIR HFA) 108 (90 Base) MCG/ACT inhaler Inhale 2 puffs into the lungs every 6 (six) hours as needed for shortness of breath. 18 g 6   aspirin 81 MG EC tablet Take 81 mg by mouth daily. Swallow whole.     cetirizine (ZYRTEC) 10 MG tablet TAKE 1 TABLET (10 MG TOTAL) BY MOUTH DAILY. 30 tablet 0   digoxin (LANOXIN) 0.125 MG tablet Take 1 tablet (0.125 mg total) by mouth daily. 90 tablet 1   fluticasone-salmeterol (ADVAIR DISKUS) 250-50 MCG/ACT AEPB Inhale 1 puff into the lungs in the morning and at bedtime. 1 each 3   furosemide (LASIX) 20 MG tablet Take 3 tablets (60 mg total) by mouth daily. 90 tablet 6   magnesium oxide (MAG-OX) 400 (240 Mg) MG tablet Take 1 tablet by mouth daily.     metoprolol succinate (TOPROL-XL) 100 MG 24 hr tablet Take 1 tablet (100 mg total) by mouth at bedtime. 90 tablet 3   sacubitril-valsartan (ENTRESTO) 24-26 MG Take 1 tablet by mouth 2 (two) times daily. 60 tablet 6   sodium zirconium cyclosilicate (LOKELMA) 10 g PACK packet Take 10 g by mouth daily. (Patient taking differently: Take 10 g by mouth daily. As needed) 1 packet 0   tiotropium (SPIRIVA HANDIHALER) 18 MCG inhalation capsule  Place 1 capsule (18 mcg total) into inhaler and inhale daily. 30 capsule 6   traZODone (DESYREL) 50 MG tablet TAKE 1 TABLET (50 MG TOTAL) BY MOUTH AT BEDTIME AS NEEDED FOR SLEEP. 30 tablet 2   No current facility-administered medications for this encounter.   Wt Readings from Last 3 Encounters:  11/22/21 64.8 kg  11/14/21 65.3 kg  10/23/21 65.3 kg   BP 96/62   Pulse 63   Wt 64.8 kg   SpO2 97%   BMI 21.71 kg/m  Physical Exam: General:  NAD. No resp difficulty, walked into clinic, fatigued appearing HEENT: Normal Neck: Supple. No JVD. Carotids 2+ bilat; no bruits. No lymphadenopathy or thryomegaly appreciated.  Cor: PMI nondisplaced. Regular rate & rhythm. No rubs, gallops or murmurs. Lungs: Clear Abdomen: Soft, nontender, nondistended. No hepatosplenomegaly. No bruits or masses. Good bowel sounds. Extremities: No cyanosis, clubbing, rash, edema Neuro: Alert & oriented x 3, cranial nerves grossly intact. Moves all 4 extremities w/o difficulty. Affect pleasant.  Assessment/Plan: 1. Chronic systolic CHF: Nonischemic cardiomyopathy.  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  Cause of CMP uncertain => prior cocaine abuse but not currently.  He drinks about 3 beers/day, denies heavier drinking in past but apparently there was concern that he had had ETOH withdrawal seizures in the past.  His grandmother had CHF but apparently none of his 13 siblings have known cardiac disease. EF has been low since at least 2016.  Most recent echo in 2/23 showed EF < 20%, severe LV dilation, moderately decreased RV systolic function with mild RV enlargement. He has a Research officer, political party ICD.  CPX 9/23 showed severe functional limitation from both HF and deconditioning. Cardiac MRI 10/23, technically difficult study due to ICD artifact, LVEF 24%, no evidence of infiltrative disease. NYHA class II- early III symptoms, he is euvolemic on exam & CoreVue. GDMT previously limited by AKI and soft BP.  - Restart Farxiga 10  mg daily. - Decrease Lasix to 20 mg daily. - Continue digoxin 0.125 mg daily. Check dig level today. - Continue Entresto 24/26 mg bid.  - Continue Toprol XL 100 mg daily. - He has been referred for Cardiac Rehab. Hopefully he can start soon. - Continue to cut back on ETOH. - Bifurcation too high for batwire so he will be commercial BAT candidate.  - We briefly discussed advanced therapies. Not transplant candidate with on-going tobacco use. Hopefully can get him in CR. He has family nearby should VAD be an option. 2. COPD: Cut down to 5 cigs/day. Encouraged cessation.  Keep follow up with Dr. Aundra Dubin 12/23/21, as scheduled.   [name], FNP-BC 11/22/2021

## 2021-11-22 NOTE — Patient Instructions (Signed)
Labs done today. We will contact you only if your labs are abnormal.  DECREASE Lasix to 20mg  (1 tablet) by mouth daily.   RESTART Wilder Glade 10mg  (1 tablet) by mouth daily.   No other medication changes were made. Please continue all current medications as prescribed.  Your physician recommends that you keep your scheduled follow-up appointment.   If you have any questions or concerns before your next appointment please send [name] a message through [place] or call our office at [phone].    TO LEAVE A MESSAGE FOR THE NURSE SELECT OPTION 2, PLEASE LEAVE A MESSAGE INCLUDING: YOUR NAME DATE OF BIRTH CALL BACK NUMBER REASON FOR CALL**this is important as we prioritize the call backs  YOU WILL RECEIVE A CALL BACK THE SAME DAY AS LONG AS YOU CALL BEFORE 4:00 PM   Do the following things EVERYDAY: Weigh yourself in the morning before breakfast. Write it down and keep it in a log. Take your medicines as prescribed Eat low salt foods--Limit salt (sodium) to 2000 mg per day.  Stay as active as you can everyday Limit all fluids for the day to less than 2 liters   At the [facility], you and your health needs are our priority. As part of our continuing mission to provide you with exceptional heart care, we have created designated Provider Care Teams. These Care Teams include your primary Cardiologist (physician) and Advanced Practice Providers (APPs- Physician Assistants and Nurse Practitioners) who all work together to provide you with the care you need, when you need it.   You may see any of the following providers on your designated Care Team at your next follow up: Dr Glori Bickers Dr Haynes Kerns, NP Lyda Jester, [place] Audry Riles, PharmD   Please be sure to bring in all your medications bottles to every appointment.

## 2021-11-25 ENCOUNTER — Telehealth (HOSPITAL_COMMUNITY): Payer: Self-pay

## 2021-11-25 DIAGNOSIS — I5022 Chronic systolic (congestive) heart failure: Secondary | ICD-10-CM

## 2021-11-25 MED ORDER — FUROSEMIDE 20 MG PO TABS: 20.0000 mg | ORAL_TABLET | Freq: Every day | ORAL | 2 refills | Status: DC | PRN

## 2021-11-25 NOTE — Telephone Encounter (Signed)
Patient advised and verbalized understanding,lab appointment scheduled,lab orders entered. Med list updated to reflect changes.   Orders Placed This Encounter  Procedures   Basic metabolic panel    Standing Status:   Future    Standing Expiration Date:   11/26/2022    Order Specific Question:   Release to patient    Answer:   Immediate    Order Specific Question:   Release to patient    Answer:   Immediate [1]   Meds ordered this encounter  Medications   furosemide (LASIX) 20 MG tablet    Sig: Take 1 tablet (20 mg total) by mouth daily as needed.    Dispense:  30 tablet    Refill:  2    Please cancel all previous orders for current medication. Change in dosage or pill size.

## 2021-11-25 NOTE — Telephone Encounter (Signed)
Contacted Mr. Yankowski for a home visit today to review his recent HF visit and med changes and for home visit follow up and he reports he is out of town with his sister and does not need a visit this week. We reviewed his med changes:  Lasix to as needed and to restart [place]. He understood same and reports he will fill pill box accordingly. I will follow up next week and he agreed. Call complete.   [name], [place] 11/25/2021

## 2021-11-25 NOTE — Telephone Encounter (Signed)
-----   Message from Rafael Bihari,  sent at 11/25/2021  8:02 AM EST ----- Labs ok. Please change lasix to 20 mg PRN weight gain/swelling.  Repeat BMET in 10-14 days

## 2021-12-02 ENCOUNTER — Telehealth: Payer: Self-pay

## 2021-12-02 NOTE — Telephone Encounter (Signed)
Spoke with patient and explained reason for call.  [name] at [facility] would like a remote transmission to review fluid levels. He is not at home but requested a call back tomorrow morning.

## 2021-12-03 ENCOUNTER — Ambulatory Visit: Payer: Medicaid Other | Attending: Cardiology

## 2021-12-03 DIAGNOSIS — Z95810 Presence of automatic (implantable) cardiac defibrillator: Secondary | ICD-10-CM | POA: Diagnosis not present

## 2021-12-03 DIAGNOSIS — I5022 Chronic systolic (congestive) heart failure: Secondary | ICD-10-CM | POA: Diagnosis not present

## 2021-12-03 MED ORDER — FUROSEMIDE 20 MG PO TABS: ORAL_TABLET | ORAL | 2 refills | Status: DC

## 2021-12-03 NOTE — Progress Notes (Signed)
Spoke with patient and provided following recommendations from [name], NP:  Change Furosemide 20 mg from PRN to 2 tablets (40 mg total) every other day alternating with 1 tablet (20 mg) to every other day.   Change lab date for BMET from 11/17 to 11/27 at the HF clinic Will starting monitoring fluid levels monthly Will recheck fluid levels 11/20 and call to assist with manual report.    Pt verbalized understanding and repeated instructions back correctly regarding Furosemide dosage change and lab date.    He does not need a refill of Lasix at this time.    Provided ICM phone number to call back with any questions or changes in condition after change in Furosemide.

## 2021-12-03 NOTE — Progress Notes (Signed)
Remote ICD transmission.   

## 2021-12-03 NOTE — Progress Notes (Signed)
  Received: Today [place], Anderson Malta, FNP  Lu Paradise, Josephine Igo, RN Thanks Jacki Cones. He should increase Lasix to 40 mg daily alternating with 20 mg every other day, with a BMET in 10 days please. Thanks  Received: Today Milford, Anderson Malta, FNP  Alexandre Lightsey, Josephine Igo, RN Can you enroll in monthly please?  Mil[place], Anderson Malta, FNP  Latoya Maulding, Josephine Igo, RN He can wait the 10 days for [facility]

## 2021-12-03 NOTE — Progress Notes (Signed)
EPIC Encounter for ICM Monitoring  Patient Name: [name] is a 56 y.o. male Date: 12/03/2021 Primary Care Physican: Default, Provider, MD Primary Cardiologist: Shirlee Latch Electrophysiologist: Elberta Fortis 12/02/2021 Weight: 145 lbs (baseline 140 lbs per patient)  12/03/2021 Weight: 147.8 lbs        HF clinic ICM check.   Heart Failure questions reviewed.  Pt symptomatic with SOB last night and weight gain of 3 lbs overnight.  Pt took PRN Furosemide last night due to SOB when lying down.  This is first PRN dosage he has taken since 11/3 HF clinic OV.    CorVue thoracic impedance suggesting possible fluid accumulation starting 11/9.   Prescribed:  Furosemide 20 mg take 1 tablet(s) (20 mg total) by mouth daily as needed (changed to PRN 11/6).  Labs: 12/06/2021 BMET scheduled 11/22/2021 Creatinine 1.40, BUN 29, Potassium 4.3, Sodium 137, GFR 59  11/01/2021 Creatinine 1.07, BUN 11, Potassium 3.6, Sodium 137, GFR >60  10/18/2021 Creatinine 0.88, BUN 12, Potassium 3.8, Sodium 137, GFR >60  10/10/2021 Creatinine 1.53, BUN 38, Potassium 4.8, Sodium 133, GFR 53  A complete set of results can be found in Results Review.  Recommendations:  Copy sent to Prince Rome, NP for review following 11/6 OV.   Follow-up plan: ICM clinic phone appointment on 12/09/2021(manual) to recheck fluid levels.   91 day device clinic remote transmission 02/10/2022.    EP/Cardiology Office Visits: 12/23/2021 with Dr. Shirlee Latch.    Copy of ICM check sent to Dr. Elberta Fortis.   3 month ICM trend: 12/03/2021.    12-14 Month ICM trend:     Karie Soda, RN 12/03/2021 8:50 AM

## 2021-12-03 NOTE — Telephone Encounter (Signed)
Spoke with patient and assisted with sending manual remote transmission.  See ICM note for results.

## 2021-12-05 ENCOUNTER — Telehealth (HOSPITAL_COMMUNITY): Payer: Self-pay

## 2021-12-05 NOTE — Telephone Encounter (Signed)
I reached out to [name] to attempt to have a home paramedicine visit today however he reports he is leaving to go out of town and is unavailable. I asked him if he has been taking his medications according to the updated revision per HF clinic and he said yes and he indicated he is still having some trouble with dizziness but reports he is peeing often. He denied any current swelling as of today. I asked if we could plan a home visit next week and he agreed. I will follow up. I asked if he had any further questions or concerns, he said he did not. Call complete.    Maralyn Sago, EMT-Paramedic [phone] 12/05/2021

## 2021-12-06 ENCOUNTER — Other Ambulatory Visit (HOSPITAL_COMMUNITY): Payer: Medicaid Other

## 2021-12-09 ENCOUNTER — Telehealth (HOSPITAL_COMMUNITY): Payer: Self-pay

## 2021-12-09 ENCOUNTER — Ambulatory Visit (INDEPENDENT_AMBULATORY_CARE_PROVIDER_SITE_OTHER): Payer: Medicaid Other

## 2021-12-09 ENCOUNTER — Telehealth: Payer: Self-pay

## 2021-12-09 DIAGNOSIS — I5022 Chronic systolic (congestive) heart failure: Secondary | ICD-10-CM

## 2021-12-09 DIAGNOSIS — Z95810 Presence of automatic (implantable) cardiac defibrillator: Secondary | ICD-10-CM

## 2021-12-09 NOTE — Telephone Encounter (Signed)
Attempted to call patient in regards to Cardiac Rehab - LM on VM Mailed letter 

## 2021-12-09 NOTE — Telephone Encounter (Signed)
Spoke with patient and assisted with remote transmission for ICM review.  See ICM note for results.

## 2021-12-09 NOTE — Progress Notes (Signed)
EPIC Encounter for ICM Monitoring  Patient Name: [name] is a 56 y.o. male Date: 12/09/2021 Primary Care Physican: Default, Provider, MD Primary Cardiologist: Shirlee Latch Electrophysiologist: Elberta Fortis 12/02/2021 Weight: 145 lbs (baseline 140 lbs per patient)     12/03/2021 Weight: 147.8 lbs  12/09/2021 Weight: 140 lbs (baseline)                                                            HF clinic ICM check.   Heart Failure questions reviewed.  Pt reports weight returned to baseline of 140 lbs and SOB has resolved after starting new Furosemide dosage on 11/15.  Message sent to Maralyn Sago EMT paramed program to make  are of Furosemide dose change.    CorVue thoracic impedance suggesting fluid levels returned to normal after starting new Furosemide dosage 11/15.    Prescribed:  Furosemide 20 mg take 2 tablets (40 mg total) by mouth every other day alternating with 1 tablet(s) (20 mg total) by mouth every other day.   Labs:  Pt being followed by Maralyn Sago, EMT paramedicine program.   12/16/2021 BMET scheduled 11/22/2021 Creatinine 1.40, BUN 29, Potassium 4.3, Sodium 137, GFR 59  11/01/2021 Creatinine 1.07, BUN 11, Potassium 3.6, Sodium 137, GFR >60  10/18/2021 Creatinine 0.88, BUN 12, Potassium 3.8, Sodium 137, GFR >60  10/10/2021 Creatinine 1.53, BUN 38, Potassium 4.8, Sodium 133, GFR 53  A complete set of results can be found in Results Review.   Recommendations:  No changes and encouraged to call if experiencing any fluid symptoms or if any problems with taking Furosemide dosage as prescribed.   Follow-up plan: ICM clinic phone appointment on 12/30/2021 to recheck fluid levels.   91 day device clinic remote transmission 02/10/2022.     EP/Cardiology Office Visits: 12/23/2021 with Dr. Shirlee Latch.     Copy of ICM check sent to Dr. Elberta Fortis.    3 month ICM trend: 12/09/2021.    12-14 Month ICM trend:     Karie Soda, RN 12/09/2021 9:47 AM

## 2021-12-10 ENCOUNTER — Telehealth (HOSPITAL_COMMUNITY): Payer: Self-pay

## 2021-12-10 LAB — CUP PACEART REMOTE DEVICE CHECK
Battery Remaining Longevity: 67 mo
Battery Remaining Percentage: 66 %
Battery Voltage: 2.98 V
Brady Statistic RV Percent Paced: 0 %
Date Time Interrogation Session: 20231120093046
HighPow Impedance: 64 Ohm
HighPow Impedance: 64 Ohm
Implantable Lead Connection Status: 753985
Implantable Lead Implant Date: 20191230
Implantable Lead Location: 753860
Implantable Lead Model: 7122
Implantable Pulse Generator Implant Date: 20191230
Lead Channel Impedance Value: 490 Ohm
Lead Channel Pacing Threshold Amplitude: 0.75 V
Lead Channel Pacing Threshold Pulse Width: 0.5 ms
Lead Channel Sensing Intrinsic Amplitude: 11.6 mV
Lead Channel Setting Pacing Amplitude: 2.5 V
Lead Channel Setting Pacing Pulse Width: 0.5 ms
Lead Channel Setting Sensing Sensitivity: 0.5 mV
Pulse Gen Serial Number: 9850980
Zone Setting Status: 755011

## 2021-12-10 NOTE — Telephone Encounter (Signed)
Spoke to [name] and confirmed home visit for next week on Tuesday. He agreed and we verified appointments coming up in the next week.   Call complete.   Maralyn Sago, EMT-Paramedic [phone] 12/10/2021

## 2021-12-11 ENCOUNTER — Ambulatory Visit: Payer: Medicaid Other | Attending: Family Medicine | Admitting: Family Medicine

## 2021-12-11 ENCOUNTER — Encounter: Payer: Self-pay | Admitting: Family Medicine

## 2021-12-11 VITALS — BP 111/60 | HR 56 | Ht 68.0 in | Wt 144.6 lb

## 2021-12-11 DIAGNOSIS — I5022 Chronic systolic (congestive) heart failure: Secondary | ICD-10-CM

## 2021-12-11 DIAGNOSIS — I426 Alcoholic cardiomyopathy: Secondary | ICD-10-CM

## 2021-12-11 DIAGNOSIS — G4709 Other insomnia: Secondary | ICD-10-CM

## 2021-12-11 DIAGNOSIS — I110 Hypertensive heart disease with heart failure: Secondary | ICD-10-CM

## 2021-12-11 MED ORDER — ATORVASTATIN CALCIUM 40 MG PO TABS: 40.0000 mg | ORAL_TABLET | Freq: Every day | ORAL | 1 refills | Status: DC

## 2021-12-11 MED ORDER — MIRTAZAPINE 15 MG PO TBDP: 15.0000 mg | ORAL_TABLET | Freq: Every day | ORAL | 1 refills | Status: DC
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
## 2021-12-11 NOTE — Progress Notes (Signed)
Trazadone medication not working No appetite.

## 2021-12-11 NOTE — Patient Instructions (Signed)
Please call to schedule your colonoscopy: [facility] [address] [place], [place] [address] PH# [phone] 

## 2021-12-16 ENCOUNTER — Other Ambulatory Visit (HOSPITAL_COMMUNITY): Payer: Medicaid Other

## 2021-12-17 ENCOUNTER — Telehealth (HOSPITAL_COMMUNITY): Payer: Self-pay

## 2021-12-17 NOTE — Telephone Encounter (Signed)
[name] contacted me at 12:10pm to report he is unable to have me come out today for a home visit because he is going out and won't be back until late tonight. I offered to come right away and he denied same.    He has canceled last several home paramedicine visits. We planned a home visit for Thursday. I will follow up at that time.   Maralyn Sago, EMT-Paramedic [phone] 12/17/2021

## 2021-12-19 ENCOUNTER — Encounter (HOSPITAL_COMMUNITY)
Admission: RE | Admit: 2021-12-19 | Discharge: 2021-12-19 | Disposition: A | Discharge status: Home / Self Care | Payer: Medicaid Other | Source: Ambulatory Visit | Attending: Cardiology | Admitting: Cardiology

## 2021-12-19 ENCOUNTER — Encounter (HOSPITAL_COMMUNITY): Payer: Self-pay

## 2021-12-19 ENCOUNTER — Telehealth (HOSPITAL_COMMUNITY): Payer: Self-pay

## 2021-12-19 VITALS — BP 90/57 | HR 89 | Ht 69.5 in | Wt 143.7 lb

## 2021-12-19 DIAGNOSIS — I5022 Chronic systolic (congestive) heart failure: Secondary | ICD-10-CM | POA: Diagnosis not present

## 2021-12-19 NOTE — Progress Notes (Signed)
Cardiac Individual Treatment Plan  Patient Details  Name: [name] MRN: [id number] Date of Birth: 08-01-1965 Referring Provider:   Flowsheet Row CARDIAC REHAB PHASE II ORIENTATION from 12/19/2021 in [facility] for Heart, V[facility], & [place]  Referring Provider Loralie Champagne, MD       Initial Encounter Date:  [place] from 12/19/2021 in [facility] for Heart, Vascular, [facility]  Date 12/19/21       Visit Diagnosis: Heart failure, chronic systolic ([place])  Patient's Home Medications on Admission:  Current Outpatient Medications:    albuterol (PROAIR HFA) 108 (90 Base) MCG/ACT inhaler, Inhale 2 puffs into the lungs every 6 (six) hours as needed for shortness of breath., Disp: 18 g, Rfl: 6   aspirin 81 MG EC tablet, Take 81 mg by mouth in the morning. Swallow whole., Disp: , Rfl:    atorvastatin (LIPITOR) 40 MG tablet, Take 1 tablet (40 mg total) by mouth daily., Disp: 90 tablet, Rfl: 1   cetirizine (ZYRTEC) 10 MG tablet, TAKE 1 TABLET (10 MG TOTAL) BY MOUTH DAILY., Disp: 30 tablet, Rfl: 0   dapagliflozin propanediol (FARXIGA) 10 MG TABS tablet, Take 1 tablet (10 mg total) by mouth daily before breakfast., Disp: 30 tablet, Rfl: 11   digoxin (LANOXIN) 0.125 MG tablet, Take 1 tablet (0.125 mg total) by mouth daily., Disp: 90 tablet, Rfl: 1   fluticasone-salmeterol (ADVAIR DISKUS) 250-50 MCG/ACT AEPB, Inhale 1 puff into the lungs in the morning and at bedtime. (Patient taking differently: Inhale 1 puff into the lungs at bedtime.), Disp: 1 each, Rfl: 3   furosemide (LASIX) 20 MG tablet, Take 1 tablet (20 mg total) by mouth every other day alternating with 2 tablets (40 mg total) every other day. (Patient taking differently: Take 20-40 mg by mouth daily as needed (fluid retention).), Disp: 135 tablet, Rfl: 2   MAGNESIUM OXIDE -MG SUPPLEMENT PO, Take 1 tablet by mouth in the morning., Disp: ,  Rfl:    metoprolol succinate (TOPROL-XL) 100 MG 24 hr tablet, Take 1 tablet (100 mg total) by mouth at bedtime., Disp: 90 tablet, Rfl: 3   mirtazapine (REMERON SOL-TAB) 15 MG disintegrating tablet, Take 1 tablet (15 mg total) by mouth at bedtime., Disp: 90 tablet, Rfl: 1   sacubitril-valsartan (ENTRESTO) 24-26 MG, Take 1 tablet by mouth 2 (two) times daily., Disp: 60 tablet, Rfl: 6   sodium zirconium cyclosilicate (LOKELMA) 10 g PACK packet, Take 10 g by mouth daily. (Patient taking differently: Take 10 g by mouth daily as needed (hyperkalemia).), Disp: 1 packet, Rfl: 0   tiotropium (SPIRIVA HANDIHALER) 18 MCG inhalation capsule, Place 1 capsule (18 mcg total) into inhaler and inhale daily., Disp: 30 capsule, Rfl: 6  Past Medical History: Past Medical History:  Diagnosis Date   Accelerated hypertension 12/06/2014   CHF (congestive heart failure) (HCC)    CHF exacerbation (HCC) 12/22/2016   Cocaine abuse ([place])    Headache    Hypertension    Stroke ([place]) 02/24/2015   SVT (supraventricular tachycardia) 06/15/2020   Thrombocytopenia ([place]) 07/31/2017    Tobacco Use: Social History   Tobacco Use  Smoking Status Every Day   Packs/day: 1.00   Years: 20.00   Total pack years: 20.00   Types: Cigarettes  Smokeless Tobacco Never  Tobacco Comments   05/14/16 smoking 1 PPD    Labs: Review Flowsheet  More data exists      Latest Ref Rng &  Units 09/10/2016 07/30/2017 10/04/2018 06/03/2019 03/05/2021  Labs for ITP Cardiac and Pulmonary Rehab  Cholestrol 100 - 199 mg/dL 158  - 172  - -  LDL (calc) 0 - 99 mg/dL 44  - 62  - -  HDL-C >39 mg/dL 100  - 81  - -  Trlycerides 0 - 149 mg/dL 69  - 177  - -  Hemoglobin A1c 4.8 - 5.6 % - - - - 5.0   PH, Arterial 7.350 - 7.450 - 7.415  7.235  - - -  PCO2 arterial 32.0 - 48.0 mmHg - 31.1  53.2  - - -  Bicarbonate 20.0 - 28.0 mmol/L - 19.9  22.5  - 25.8  26.5  -  TCO2 22 - 32 mmol/L - 21  24  - 27  28  -  Acid-base deficit 0.0 - 2.0 mmol/L - 4.0  5.0  - - -   O2 Saturation % - 100.0  100.0  - 74.0  69.0  -    Capillary Blood Glucose: Lab Results  Component Value Date   GLUCAP 81 05/23/2020   GLUCAP 93 07/30/2017   GLUCAP 72 07/30/2017   GLUCAP 97 07/30/2017   GLUCAP 61 (L) 07/30/2017     Exercise Target Goals: Exercise Program Goal: Individual exercise prescription set using results from initial 6 min walk test and THRR while considering  patient's activity barriers and safety.   Exercise Prescription Goal: Initial exercise prescription builds to 30-45 minutes a day of aerobic activity, 2-3 days per week.  Home exercise guidelines will be given to patient during program as part of exercise prescription that the participant will acknowledge.  Activity Barriers & Risk Stratification:  Activity Barriers & Cardiac Risk Stratification - 12/19/21 1227       Activity Barriers & Cardiac Risk Stratification   Activity Barriers Deconditioning;Shortness of Breath;Decreased Ventricular Function;Balance Concerns;History of Falls    Cardiac Risk Stratification High             6 Minute Walk:  6 Minute Walk     Row Name 12/19/21 1242         6 Minute Walk   Phase Initial     Distance 1145 feet     Walk Time 6 minutes     # of Rest Breaks 0     MPH 2.2     METS 3.62     RPE 13     Perceived Dyspnea  1     VO2 Peak 12.7     Symptoms Yes (comment)     Comments Burning in the thighs, no pain     Resting HR 88 bpm     Resting BP 90/57     Resting Oxygen Saturation  97 %     Exercise Oxygen Saturation  during 6 min walk 98 %     Max Ex. HR 102 bpm     Max Ex. BP 98/68     2 Minute Post BP 95/65              Oxygen Initial Assessment:   Oxygen Re-Evaluation:   Oxygen Discharge (Final Oxygen Re-Evaluation):   Initial Exercise Prescription:  Initial Exercise Prescription - 12/19/21 1400       Date of Initial Exercise RX and Referring Provider   Date 12/19/21    Referring Provider Loralie Champagne, MD    Expected  Discharge Date 02/14/22      NuStep   Level 2    SPM  75    Minutes 15    METs 3.6      Arm Ergometer   Level 1.5    Watts 40    RPM 60    Minutes 15    METs 3.6      Prescription Details   Frequency (times per week) 3    Duration Progress to 30 minutes of continuous aerobic without signs/symptoms of physical distress      Intensity   THRR 40-80% of Max Heartrate 66-132    Ratings of Perceived Exertion 11-13    Perceived Dyspnea 0-4      Progression   Progression Continue progressive overload as per policy without signs/symptoms or physical distress.      Resistance Training   Training Prescription Yes    Weight 3 lbs    Reps 10-15             Perform Capillary Blood Glucose checks as needed.  Exercise Prescription Changes:   Exercise Comments:   Exercise Goals and Review:   Exercise Goals     Row Name 12/19/21 1228             Exercise Goals   Increase Physical Activity Yes       Intervention Provide advice, education, support and counseling about physical activity/exercise needs.;Develop an individualized exercise prescription for aerobic and resistive training based on initial evaluation findings, risk stratification, comorbidities and participant's personal goals.       Expected Outcomes Short Term: Attend rehab on a regular basis to increase amount of physical activity.;Long Term: Add in home exercise to make exercise part of routine and to increase amount of physical activity.;Long Term: Exercising regularly at least 3-5 days a week.       Increase Strength and Stamina Yes       Intervention Provide advice, education, support and counseling about physical activity/exercise needs.;Develop an individualized exercise prescription for aerobic and resistive training based on initial evaluation findings, risk stratification, comorbidities and participant's personal goals.       Expected Outcomes Short Term: Increase workloads from initial exercise  prescription for resistance, speed, and METs.;Short Term: Perform resistance training exercises routinely during rehab and add in resistance training at home;Long Term: Improve cardiorespiratory fitness, muscular endurance and strength as measured by increased METs and functional capacity (6MWT)       Able to understand and use rate of perceived exertion (RPE) scale Yes       Intervention Provide education and explanation on how to use RPE scale       Expected Outcomes Short Term: Able to use RPE daily in rehab to express subjective intensity level;Long Term:  Able to use RPE to guide intensity level when exercising independently       Knowledge and understanding of Target Heart Rate Range (THRR) Yes       Intervention Provide education and explanation of THRR including how the numbers were predicted and where they are located for reference       Expected Outcomes Short Term: Able to state/look up THRR;Short Term: Able to use daily as guideline for intensity in rehab;Long Term: Able to use THRR to govern intensity when exercising independently       Understanding of Exercise Prescription Yes       Intervention Provide education, explanation, and written materials on patient's individual exercise prescription       Expected Outcomes Short Term: Able to explain program exercise prescription;Long Term: Able to explain home exercise prescription to  exercise independently                Exercise Goals Re-Evaluation :   Discharge Exercise Prescription (Final Exercise Prescription Changes):   Nutrition:  Target Goals: Understanding of nutrition guidelines, daily intake of sodium 1500mg , cholesterol 200mg , calories 30% from fat and 7% or less from saturated fats, daily to have 5 or more servings of fruits and vegetables.  Biometrics:  Pre Biometrics - 12/19/21 1042       Pre Biometrics   Waist Circumference 34 inches    Hip Circumference 36 inches    Waist to Hip Ratio 0.94 %    Triceps  Skinfold 5 mm    % Body Fat 17.4 %    Grip Strength 38 kg    Flexibility 0 in   Could not reach   Single Leg Stand 5.68 seconds              Nutrition Therapy Plan and Nutrition Goals:   Nutrition Assessments:  MEDIFICTS Score Key: ?70 Need to make dietary changes  40-70 Heart Healthy Diet ? 40 Therapeutic Level Cholesterol Diet    Picture Your Plate Scores: [id number] Unhealthy dietary pattern with much room for improvement. 41-50 Dietary pattern unlikely to meet recommendations for good health and room for improvement. 51-60 More healthful dietary pattern, with some room for improvement.  >60 Healthy dietary pattern, although there may be some specific behaviors that could be improved.    Nutrition Goals Re-Evaluation:   Nutrition Goals Re-Evaluation:   Nutrition Goals Discharge (Final Nutrition Goals Re-Evaluation):   Psychosocial: Target Goals: Acknowledge presence or absence of significant depression and/or stress, maximize coping skills, provide positive support system. Participant is able to verbalize types and ability to use techniques and skills needed for reducing stress and depression.  Initial Review & Psychosocial Screening:  Initial Psych Review & Screening - 12/19/21 1309       Initial Review   Current issues with Current Depression;Current Anxiety/Panic;Current Sleep Concerns      Family Dynamics   Good Support System? Yes    Comments Caid feels supported by his mother who has a medical background and sister.  They help him when he becomes overwhelmed with the medication changes which happen due to lab work, symptoms and etc.  He his unable to understand which medications need to be taken.  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 Has anxiety being in large crowds avoids [employer] and going to the club although he has in the past.  Hopeful that he will learn  tools to help decrease his anxiety.  [place] is able to go to sleep but wakes up around 2:00 and watches TV.  Prescribed a sleep aid by his PCP but this made him sleep until 6:00 in the evening.  He likes to watch TV and leaves the TV on at night.      Barriers   Psychosocial barriers to participate in program The patient should benefit from training in stress management and relaxation.;Psychosocial barriers identified (see note)      Screening Interventions   Interventions Encouraged to exercise;Provide feedback about the scores to participant    Expected Outcomes Long Term Goal: Stressors or current issues are controlled or eliminated.;Short Term goal: Identification and review with participant of any Quality of Life or Depression concerns found by scoring the questionnaire.;Long Term goal: The participant  improves quality of Life and PHQ9 Scores as seen by post scores and/or verbalization of changes             Quality of Life Scores:  Quality of Life - 12/19/21 1224       Quality of Life   Select Quality of Life      Quality of Life Scores   Health/Function Pre 20.43 %    Socioeconomic Pre 30 %    Psych/Spiritual Pre 29.14 %    Family Pre 27 %    GLOBAL Pre 25.11 %            Scores of 19 and below usually indicate a poorer quality of life in these areas.  A difference of  2-3 points is a clinically meaningful difference.  A difference of 2-3 points in the total score of the Quality of Life Index has been associated with significant improvement in overall quality of life, self-image, physical symptoms, and general health in studies assessing change in quality of life.  PHQ-9: Review Flowsheet  More data exists      12/19/2021 03/27/2021 09/10/2020 06/07/2019 12/09/2017  Depression screen PHQ 2/9  Decreased Interest 0 0 0 0 2 1  Down, Depressed, Hopeless 0 0 0 0 0 0  PHQ - 2 Score 0 0 0 0 2 1  Altered sleeping 3 3 - 3 3 1   Tired, decreased energy 1 1 - 0 3 2  Change in  appetite 3 3 - 2 - 3  Feeling bad or failure about yourself  0 0 - 0 0 0  Trouble concentrating 3 3 - 0 2 1  Moving slowly or fidgety/restless 0 0 - 0 1 0  Suicidal thoughts 0 0 - 0 0 0  PHQ-9 Score 10 10 - 5 11 8   Difficult doing work/chores Extremely dIfficult Not difficult at all - - Not difficult at all -   Interpretation of Total Score  Total Score Depression Severity:  1-4 = Minimal depression, 5-9 = Mild depression, 10-14 = Moderate depression, 15-19 = Moderately severe depression, 20-27 = Severe depression   Psychosocial Evaluation and Intervention:   Psychosocial Re-Evaluation:   Psychosocial Discharge (Final Psychosocial Re-Evaluation):   Vocational Rehabilitation: Provide vocational rehab assistance to qualifying candidates.   Vocational Rehab Evaluation & Intervention:  Vocational Rehab - 12/19/21 1609       Initial Vocational Rehab Evaluation & Intervention   Assessment shows need for Vocational Rehabilitation No             Education: Education Goals: Education classes will be provided on a weekly basis, covering required topics. Participant will state understanding/return demonstration of topics presented.     Core Videos: Exercise    Move It!  Clinical staff conducted group or individual video education with verbal and written material and guidebook.  Patient learns the recommended Pritikin exercise program. Exercise with the goal of living a long, healthy life. Some of the health benefits of exercise include controlled diabetes, healthier blood pressure levels, improved cholesterol levels, improved heart and lung capacity, improved sleep, and better body composition. Everyone should speak with their doctor before starting or changing an exercise routine.  Biomechanical Limitations Clinical staff conducted group or individual video education with verbal and written material and guidebook.  Patient learns how biomechanical limitations can impact  exercise and how we can mitigate and possibly overcome limitations to have an impactful and balanced exercise routine.  Body Composition Clinical staff conducted group or individual video  education with verbal and written material and guidebook.  Patient learns that body composition (ratio of muscle mass to fat mass) is a key component to assessing overall fitness, rather than body weight alone. Increased fat mass, especially visceral belly fat, can put [name] at increased risk for metabolic syndrome, type 2 diabetes, heart disease, and even death. It is recommended to combine diet and exercise (cardiovascular and resistance training) to improve your body composition. Seek guidance from your physician and exercise physiologist before implementing an exercise routine.  Exercise Action Plan Clinical staff conducted group or individual video education with verbal and written material and guidebook.  Patient learns the recommended strategies to achieve and enjoy long-term exercise adherence, including variety, self-motivation, self-efficacy, and positive decision making. Benefits of exercise include fitness, good health, weight management, more energy, better sleep, less stress, and overall well-being.  Medical   Heart Disease Risk Reduction Clinical staff conducted group or individual video education with verbal and written material and guidebook.  Patient learns our heart is our most vital organ as it circulates oxygen, nutrients, white blood cells, and hormones throughout the entire body, and carries waste away. Data supports a plant-based eating plan like the Pritikin Program for its effectiveness in slowing progression of and reversing heart disease. The video provides a number of recommendations to address heart disease.   Metabolic Syndrome and Belly Fat  Clinical staff conducted group or individual video education with verbal and written material and guidebook.  Patient learns what metabolic  syndrome is, how it leads to heart disease, and how one can reverse it and keep it from coming back. You have metabolic syndrome if you have 3 of the following 5 criteria: abdominal obesity, high blood pressure, high triglycerides, low HDL cholesterol, and high blood sugar.  Hypertension and Heart Disease Clinical staff conducted group or individual video education with verbal and written material and guidebook.  Patient learns that high blood pressure, or hypertension, is very common in the [place]. Hypertension is largely due to excessive salt intake, but other important risk factors include being overweight, physical inactivity, drinking too much alcohol, smoking, and not eating enough potassium from fruits and vegetables. High blood pressure is a leading risk factor for heart attack, stroke, congestive heart failure, dementia, kidney failure, and premature death. Long-term effects of excessive salt intake include stiffening of the arteries and thickening of heart muscle and organ damage. Recommendations include ways to reduce hypertension and the risk of heart disease.  Diseases of Our Time - Focusing on Diabetes Clinical staff conducted group or individual video education with verbal and written material and guidebook.  Patient learns why the best way to stop diseases of our time is prevention, through food and other lifestyle changes. Medicine (such as prescription pills and surgeries) is often only a Band-Aid on the problem, not a long-term solution. Most common diseases of our time include obesity, type 2 diabetes, hypertension, heart disease, and cancer. The Pritikin Program is recommended and has been proven to help reduce, reverse, and/or prevent the damaging effects of metabolic syndrome.  Nutrition   Overview of the Pritikin Eating Plan  Clinical staff conducted group or individual video education with verbal and written material and guidebook.  Patient learns about the Pritikin  Eating Plan for disease risk reduction. The Pritikin Eating Plan emphasizes a wide variety of unrefined, minimally-processed carbohydrates, like fruits, vegetables, whole grains, and legumes. Go, Caution, and Stop food choices are explained. Plant-based and lean animal proteins are emphasized. Rationale  provided for low sodium intake for blood pressure control, low added sugars for blood sugar stabilization, and low added fats and oils for coronary artery disease risk reduction and weight management.  Calorie Density  Clinical staff conducted group or individual video education with verbal and written material and guidebook.  Patient learns about calorie density and how it impacts the Pritikin Eating Plan. Knowing the characteristics of the food you choose will help you decide whether those foods will lead to weight gain or weight loss, and whether you want to consume more or less of them. Weight loss is usually a side effect of the Pritikin Eating Plan because of its focus on low calorie-dense foods.  Label Reading  Clinical staff conducted group or individual video education with verbal and written material and guidebook.  Patient learns about the Pritikin recommended label reading guidelines and corresponding recommendations regarding calorie density, added sugars, sodium content, and whole grains.  Dining Out - Part 1  Clinical staff conducted group or individual video education with verbal and written material and guidebook.  Patient learns that restaurant meals can be sabotaging because they can be so high in calories, fat, sodium, and/or sugar. Patient learns recommended strategies on how to positively address this and avoid unhealthy pitfalls.  Facts on Fats  Clinical staff conducted group or individual video education with verbal and written material and guidebook.  Patient learns that lifestyle modifications can be just as effective, if not more so, as many medications for lowering your  risk of heart disease. A Pritikin lifestyle can help to reduce your risk of inflammation and atherosclerosis (cholesterol build-up, or plaque, in the artery walls). Lifestyle interventions such as dietary choices and physical activity address the cause of atherosclerosis. A review of the types of fats and their impact on blood cholesterol levels, along with dietary recommendations to reduce fat intake is also included.  Nutrition Action Plan  Clinical staff conducted group or individual video education with verbal and written material and guidebook.  Patient learns how to incorporate Pritikin recommendations into their lifestyle. Recommendations include planning and keeping personal health goals in mind as an important part of their success.  Healthy Mind-Set    Healthy Minds, Bodies, Hearts  Clinical staff conducted group or individual video education with verbal and written material and guidebook.  Patient learns how to identify when they are stressed. Video will discuss the impact of that stress, as well as the many benefits of stress management. Patient will also be introduced to stress management techniques. The way we think, act, and feel has an impact on our hearts.  How Our Thoughts Can Heal Our Hearts  Clinical staff conducted group or individual video education with verbal and written material and guidebook.  Patient learns that negative thoughts can cause depression and anxiety. This can result in negative lifestyle behavior and serious health problems. Cognitive behavioral therapy is an effective method to help control our thoughts in order to change and improve our emotional outlook.  Additional Videos:  Exercise    Improving Performance  Clinical staff conducted group or individual video education with verbal and written material and guidebook.  Patient learns to use a non-linear approach by alternating intensity levels and lengths of time spent exercising to help burn more calories  and lose more body fat. Cardiovascular exercise helps improve heart health, metabolism, hormonal balance, blood sugar control, and recovery from fatigue. Resistance training improves strength, endurance, balance, coordination, reaction time, metabolism, and muscle mass. Flexibility exercise improves  circulation, posture, and balance. Seek guidance from your physician and exercise physiologist before implementing an exercise routine and learn your capabilities and proper form for all exercise.  Introduction to Yoga  Clinical staff conducted group or individual video education with verbal and written material and guidebook.  Patient learns about yoga, a discipline of the coming together of mind, breath, and body. The benefits of yoga include improved flexibility, improved range of motion, better posture and core strength, increased lung function, weight loss, and positive self-image. Yoga's heart health benefits include lowered blood pressure, healthier heart rate, decreased cholesterol and triglyceride levels, improved immune function, and reduced stress. Seek guidance from your physician and exercise physiologist before implementing an exercise routine and learn your capabilities and proper form for all exercise.  Medical   Aging: Enhancing Your Quality of Life  Clinical staff conducted group or individual video education with verbal and written material and guidebook.  Patient learns key strategies and recommendations to stay in good physical health and enhance quality of life, such as prevention strategies, having an advocate, securing a [place], and keeping a list of medications and system for tracking them. It also discusses how to avoid risk for bone loss.  Biology of Weight Control  Clinical staff conducted group or individual video education with verbal and written material and guidebook.  Patient learns that weight gain occurs because we consume more calories than  we burn (eating more, moving less). Even if your body weight is normal, you may have higher ratios of fat compared to muscle mass. Too much body fat puts you at increased risk for cardiovascular disease, heart attack, stroke, type 2 diabetes, and obesity-related cancers. In addition to exercise, following the [place] can help reduce your risk.  Decoding Lab Results  Clinical staff conducted group or individual video education with verbal and written material and guidebook.  Patient learns that lab test reflects one measurement whose values change over time and are influenced by many factors, including medication, stress, sleep, exercise, food, hydration, pre-existing medical conditions, and more. It is recommended to use the knowledge from this video to become more involved with your lab results and evaluate your numbers to speak with your doctor.   Diseases of Our Time - Overview  Clinical staff conducted group or individual video education with verbal and written material and guidebook.  Patient learns that according to the CDC, 50% to 70% of chronic diseases (such as obesity, type 2 diabetes, elevated lipids, hypertension, and heart disease) are avoidable through lifestyle improvements including healthier food choices, listening to satiety cues, and increased physical activity.  Sleep Disorders Clinical staff conducted group or individual video education with verbal and written material and guidebook.  Patient learns how good quality and duration of sleep are important to overall health and well-being. Patient also learns about sleep disorders and how they impact health along with recommendations to address them, including discussing with a physician.  Nutrition  Dining Out - Part 2 Clinical staff conducted group or individual video education with verbal and written material and guidebook.  Patient learns how to plan ahead and communicate in order to maximize their dining experience  in a healthy and nutritious manner. Included are recommended food choices based on the type of restaurant the patient is visiting.   Fueling a Best boy conducted group or individual video education with verbal and written material and guidebook.  There is a strong connection between  our food choices and our health. Diseases like obesity and type 2 diabetes are very prevalent and are in large-part due to lifestyle choices. The Pritikin Eating Plan provides plenty of food and hunger-curbing satisfaction. It is easy to follow, affordable, and helps reduce health risks.  Menu Workshop  Clinical staff conducted group or individual video education with verbal and written material and guidebook.  Patient learns that restaurant meals can sabotage health goals because they are often packed with calories, fat, sodium, and sugar. Recommendations include strategies to plan ahead and to communicate with the manager, chef, or server to help order a healthier meal.  Planning Your Eating Strategy  Clinical staff conducted group or individual video education with verbal and written material and guidebook.  Patient learns about the [place] and its benefit of reducing the risk of disease. The [place] does not focus on calories. Instead, it emphasizes high-quality, nutrient-rich foods. By knowing the characteristics of the foods, we choose, we can determine their calorie density and make informed decisions.  Targeting Your Nutrition Priorities  Clinical staff conducted group or individual video education with verbal and written material and guidebook.  Patient learns that lifestyle habits have a tremendous impact on disease risk and progression. This video provides eating and physical activity recommendations based on your personal health goals, such as reducing LDL cholesterol, losing weight, preventing or controlling type 2 diabetes, and reducing high blood  pressure.  Vitamins and Minerals  Clinical staff conducted group or individual video education with verbal and written material and guidebook.  Patient learns different ways to obtain key vitamins and minerals, including through a recommended healthy diet. It is important to discuss all supplements you take with your doctor.   Healthy Mind-Set    Smoking Cessation  Clinical staff conducted group or individual video education with verbal and written material and guidebook.  Patient learns that cigarette smoking and tobacco addiction pose a serious health risk which affects millions of people. Stopping smoking will significantly reduce the risk of heart disease, lung disease, and many forms of cancer. Recommended strategies for quitting are covered, including working with your doctor to develop a successful plan.  Culinary   Becoming a Financial trader conducted group or individual video education with verbal and written material and guidebook.  Patient learns that cooking at home can be healthy, cost-effective, quick, and puts them in control. Keys to cooking healthy recipes will include looking at your recipe, assessing your equipment needs, planning ahead, making it simple, choosing cost-effective seasonal ingredients, and limiting the use of added fats, salts, and sugars.  Cooking - Breakfast and Snacks  Clinical staff conducted group or individual video education with verbal and written material and guidebook.  Patient learns how important breakfast is to satiety and nutrition through the entire day. Recommendations include key foods to eat during breakfast to help stabilize blood sugar levels and to prevent overeating at meals later in the day. Planning ahead is also a key component.  Cooking - Human resources officer conducted group or individual video education with verbal and written material and guidebook.  Patient learns eating strategies to improve overall  health, including an approach to cook more at home. Recommendations include thinking of animal protein as a side on your plate rather than center stage and focusing instead on lower calorie dense options like vegetables, fruits, whole grains, and plant-based proteins, such as beans. Making sauces in large quantities to freeze for  later and leaving the skin on your vegetables are also recommended to maximize your experience.  Cooking - Healthy Salads and Dressing Clinical staff conducted group or individual video education with verbal and written material and guidebook.  Patient learns that vegetables, fruits, whole grains, and legumes are the foundations of the [place]. Recommendations include how to incorporate each of these in flavorful and healthy salads, and how to create homemade salad dressings. Proper handling of ingredients is also covered. Cooking - Soups and [employer] - Soups and Desserts Clinical staff conducted group or individual video education with verbal and written material and guidebook.  Patient learns that Pritikin soups and desserts make for easy, nutritious, and delicious snacks and meal components that are low in sodium, fat, sugar, and calorie density, while high in vitamins, minerals, and filling fiber. Recommendations include simple and healthy ideas for soups and desserts.   Overview     The Pritikin Solution Program Overview Clinical staff conducted group or individual video education with verbal and written material and guidebook.  Patient learns that the results of the [place] Program have been documented in more than 100 articles published in peer-reviewed journals, and the benefits include reducing risk factors for (and, in some cases, even reversing) high cholesterol, high blood pressure, type 2 diabetes, obesity, and more! An overview of the three key pillars of the Pritikin Program will be covered: eating well, doing regular exercise, and having a  healthy mind-set.  WORKSHOPS  Exercise: Exercise Basics: Building Your Action Plan Clinical staff led group instruction and group discussion with PowerPoint presentation and patient guidebook. To enhance the learning environment the use of posters, models and videos may be added. At the conclusion of this workshop, patients will comprehend the difference between physical activity and exercise, as well as the benefits of incorporating both, into their routine. Patients will understand the FITT (Frequency, Intensity, Time, and Type) principle and how to use it to build an exercise action plan. In addition, safety concerns and other considerations for exercise and cardiac rehab will be addressed by the presenter. The purpose of this lesson is to promote a comprehensive and effective weekly exercise routine in order to improve patients' overall level of fitness.   Managing Heart Disease: Your Path to a Healthier Heart Clinical staff led group instruction and group discussion with PowerPoint presentation and patient guidebook. To enhance the learning environment the use of posters, models and videos may be added.At the conclusion of this workshop, patients will understand the anatomy and physiology of the heart. Additionally, they will understand how Pritikin's three pillars impact the risk factors, the progression, and the management of heart disease.  The purpose of this lesson is to provide a high-level overview of the heart, heart disease, and how the Pritikin lifestyle positively impacts risk factors.  Exercise Biomechanics Clinical staff led group instruction and group discussion with PowerPoint presentation and patient guidebook. To enhance the learning environment the use of posters, models and videos may be added. Patients will learn how the structural parts of their bodies function and how these functions impact their daily activities, movement, and exercise. Patients will learn how to  promote a neutral spine, learn how to manage pain, and identify ways to improve their physical movement in order to promote healthy living. The purpose of this lesson is to expose patients to common physical limitations that impact physical activity. Participants will learn practical ways to adapt and manage aches and pains, and to minimize their  effect on regular exercise. Patients will learn how to maintain good posture while sitting, walking, and lifting.  Balance Training and Fall Prevention  Clinical staff led group instruction and group discussion with PowerPoint presentation and patient guidebook. To enhance the learning environment the use of posters, models and videos may be added. At the conclusion of this workshop, patients will understand the importance of their sensorimotor skills (vision, proprioception, and the vestibular system) in maintaining their ability to balance as they age. Patients will apply a variety of balancing exercises that are appropriate for their current level of function. Patients will understand the common causes for poor balance, possible solutions to these problems, and ways to modify their physical environment in order to minimize their fall risk. The purpose of this lesson is to teach patients about the importance of maintaining balance as they age and ways to minimize their risk of falling.  WORKSHOPS   Nutrition:  Fueling a Scientist, research (physical sciences) led group instruction and group discussion with PowerPoint presentation and patient guidebook. To enhance the learning environment the use of posters, models and videos may be added. Patients will review the foundational principles of the [place] and understand what constitutes a serving size in each of the food groups. Patients will also learn Pritikin-friendly foods that are better choices when away from home and review make-ahead meal and snack options. Calorie density will be reviewed and  applied to three nutrition priorities: weight maintenance, weight loss, and weight gain. The purpose of this lesson is to reinforce (in a group setting) the key concepts around what patients are recommended to eat and how to apply these guidelines when away from home by planning and selecting Pritikin-friendly options. Patients will understand how calorie density may be adjusted for different weight management goals.  Mindful Eating  Clinical staff led group instruction and group discussion with PowerPoint presentation and patient guidebook. To enhance the learning environment the use of posters, models and videos may be added. Patients will briefly review the concepts of the [place] and the importance of low-calorie dense foods. The concept of mindful eating will be introduced as well as the importance of paying attention to internal hunger signals. Triggers for non-hunger eating and techniques for dealing with triggers will be explored. The purpose of this lesson is to provide patients with the opportunity to review the basic principles of the [place], discuss the value of eating mindfully and how to measure internal cues of hunger and fullness using the Hunger Scale. Patients will also discuss reasons for non-hunger eating and learn strategies to use for controlling emotional eating.  Targeting Your Nutrition Priorities Clinical staff led group instruction and group discussion with PowerPoint presentation and patient guidebook. To enhance the learning environment the use of posters, models and videos may be added. Patients will learn how to determine their genetic susceptibility to disease by reviewing their family history. Patients will gain insight into the importance of diet as part of an overall healthy lifestyle in mitigating the impact of genetics and other environmental insults. The purpose of this lesson is to provide patients with the opportunity to assess their personal  nutrition priorities by looking at their family history, their own health history and current risk factors. Patients will also be able to discuss ways of prioritizing and modifying the [place] for their highest risk areas  Menu  Clinical staff led group instruction and group discussion with PowerPoint presentation and patient guidebook. To  enhance the learning environment the use of posters, models and videos may be added. Using menus brought in from [employer], or printed from [employer], patients will apply the [place] dining out guidelines that were presented in the [employer] video. Patients will also be able to practice these guidelines in a variety of provided scenarios. The purpose of this lesson is to provide patients with the opportunity to practice hands-on learning of the [place] with actual menus and practice scenarios.  Label Reading Clinical staff led group instruction and group discussion with PowerPoint presentation and patient guidebook. To enhance the learning environment the use of posters, models and videos may be added. Patients will review and discuss the Pritikin label reading guidelines presented in Pritikin's Label Reading Educational series video. Using fool labels brought in from local grocery stores and markets, patients will apply the label reading guidelines and determine if the packaged food meet the Pritikin guidelines. The purpose of this lesson is to provide patients with the opportunity to review, discuss, and practice hands-on learning of the Pritikin Label Reading guidelines with actual packaged food labels. [place] Workshops are designed to teach patients ways to prepare quick, simple, and affordable recipes at home. The importance of nutrition's role in chronic disease risk reduction is reflected in its emphasis in the overall Pritikin program. By learning how to prepare  essential core Pritikin Eating Plan recipes, patients will increase control over what they eat; be able to customize the flavor of foods without the use of added salt, sugar, or fat; and improve the quality of the food they consume. By learning a set of core recipes which are easily assembled, quickly prepared, and affordable, patients are more likely to prepare more healthy foods at home. These workshops focus on convenient breakfasts, simple entres, side dishes, and desserts which can be prepared with minimal effort and are consistent with nutrition recommendations for cardiovascular risk reduction. Cooking [employer] are taught by a Engineer, materials (RD) who has been trained by the [employer]. The chef or RD has a clear understanding of the importance of minimizing - if not completely eliminating - added fat, sugar, and sodium in recipes. Throughout the series of [place] Workshop sessions, patients will learn about healthy ingredients and efficient methods of cooking to build confidence in their capability to prepare    Cooking School weekly topics:  Adding Flavor- Sodium-Free  Fast and Healthy Breakfasts  Powerhouse Plant-Based Proteins  Satisfying Salads and Dressings  Simple Sides and Sauces  International Cuisine-Spotlight on the [place] Zones  Delicious Desserts  Savory Soups  Efficiency Cooking - Meals in a Snap  Tasty Appetizers and Snacks  Comforting Weekend Breakfasts  One-Pot Wonders   Fast Evening Meals  Easy [place] (Psychosocial): New Thoughts, New Behaviors Clinical staff led group instruction and group discussion with PowerPoint presentation and patient guidebook. To enhance the learning environment the use of posters, models and videos may be added. Patients will learn and practice techniques for developing effective health and lifestyle goals. Patients will be able  to effectively apply the goal setting process learned to develop at least one new personal goal.  The purpose of this lesson is to expose patients to a new skill set of behavior modification techniques such as techniques setting SMART goals, overcoming barriers, and achieving new thoughts and new behaviors.  Managing Moods  and Relationships Clinical staff led group instruction and group discussion with PowerPoint presentation and patient guidebook. To enhance the learning environment the use of posters, models and videos may be added. Patients will learn how emotional and chronic stress factors can impact their health and relationships. They will learn healthy ways to manage their moods and utilize positive coping mechanisms. In addition, ICR patients will learn ways to improve communication skills. The purpose of this lesson is to expose patients to ways of understanding how one's mood and health are intimately connected. Developing a healthy outlook can help build positive relationships and connections with others. Patients will understand the importance of utilizing effective communication skills that include actively listening and being heard. They will learn and understand the importance of the "4 Cs" and especially Connections in fostering of a Healthy Mind-Set.  Healthy Sleep for a Healthy Heart Clinical staff led group instruction and group discussion with PowerPoint presentation and patient guidebook. To enhance the learning environment the use of posters, models and videos may be added. At the conclusion of this workshop, patients will be able to demonstrate knowledge of the importance of sleep to overall health, well-being, and quality of life. They will understand the symptoms of, and treatments for, common sleep disorders. Patients will also be able to identify daytime and nighttime behaviors which impact sleep, and they will be able to apply these tools to help manage sleep-related challenges.  The purpose of this lesson is to provide patients with a general overview of sleep and outline the importance of quality sleep. Patients will learn about a few of the most common sleep disorders. Patients will also be introduced to the concept of "sleep hygiene," and discover ways to self-manage certain sleeping problems through simple daily behavior changes. Finally, the workshop will motivate patients by clarifying the links between quality sleep and their goals of heart-healthy living.   Recognizing and Reducing Stress Clinical staff led group instruction and group discussion with PowerPoint presentation and patient guidebook. To enhance the learning environment the use of posters, models and videos may be added. At the conclusion of this workshop, patients will be able to understand the types of stress reactions, differentiate between acute and chronic stress, and recognize the impact that chronic stress has on their health. They will also be able to apply different coping mechanisms, such as reframing negative self-talk. Patients will have the opportunity to practice a variety of stress management techniques, such as deep abdominal breathing, progressive muscle relaxation, and/or guided imagery.  The purpose of this lesson is to educate patients on the role of stress in their lives and to provide healthy techniques for coping with it.  Learning Barriers/Preferences:  Learning Barriers/Preferences - 12/19/21 1225       Learning Barriers/Preferences   Learning Barriers Sight;Hearing   reading glasses, Left ear hearing loss per pt   Learning Preferences Written Material             Education Topics:  Knowledge Questionnaire Score:  Knowledge Questionnaire Score - 12/19/21 1226       Knowledge Questionnaire Score   Pre Score 25/28             Core Components/Risk Factors/Patient Goals at Admission:  Personal Goals and Risk Factors at Admission - 12/19/21 1226       Core  Components/Risk Factors/Patient Goals on Admission   Heart Failure Yes    Intervention Provide a combined exercise and nutrition program that is supplemented with education, support and counseling  about heart failure. Directed toward relieving symptoms such as shortness of breath, decreased exercise tolerance, and extremity edema.    Expected Outcomes Long term: Adoption of self-care skills and reduction of barriers for early signs and symptoms recognition and intervention leading to self-care maintenance.;Short term: Daily weights obtained and reported for increase. Utilizing diuretic protocols set by physician.;Short term: Attendance in program 2-3 days a week with increased exercise capacity. Reported lower sodium intake. Reported increased fruit and vegetable intake. Reports medication compliance.;Improve functional capacity of life    Hypertension Yes    Intervention Provide education on lifestyle modifcations including regular physical activity/exercise, weight management, moderate sodium restriction and increased consumption of fresh fruit, vegetables, and low fat dairy, alcohol moderation, and smoking cessation.;Monitor prescription use compliance.    Expected Outcomes Short Term: Continued assessment and intervention until BP is < 140/101mm HG in hypertensive participants. < 130/80mm HG in hypertensive participants with diabetes, heart failure or chronic kidney disease.;Long Term: Maintenance of blood pressure at goal levels.    Lipids Yes    Intervention Provide education and support for participant on nutrition & aerobic/resistive exercise along with prescribed medications to achieve LDL 70mg , HDL >40mg .    Expected Outcomes Short Term: Participant states understanding of desired cholesterol values and is compliant with medications prescribed. Participant is following exercise prescription and nutrition guidelines.;Long Term: Cholesterol controlled with medications as prescribed, with  individualized exercise RX and with personalized nutrition plan. Value goals: LDL < 70mg , HDL > 40 mg.    Stress Yes    Intervention Offer individual and/or small group education and counseling on adjustment to heart disease, stress management and health-related lifestyle change. Teach and support self-help strategies.;Refer participants experiencing significant psychosocial distress to appropriate mental health specialists for further evaluation and treatment. When possible, include family members and significant others in education/counseling sessions.    Expected Outcomes Short Term: Participant demonstrates changes in health-related behavior, relaxation and other stress management skills, ability to obtain effective social support, and compliance with psychotropic medications if prescribed.;Long Term: Emotional wellbeing is indicated by absence of clinically significant psychosocial distress or social isolation.             Core Components/Risk Factors/Patient Goals Review:    Core Components/Risk Factors/Patient Goals at Discharge (Final Review):    ITP Comments:  ITP Comments     Row Name 12/19/21 1547           ITP Comments Dr. Fransico Him medical director. Introduction to [facility]itikin education program Intensive Cardiac Rehab. Initial orientation packet reviewed with patient.                Comments: Participant attended orientation for the cardiac rehabilitation program on  12/19/2021  to perform initial intake and exercise walk test. Patient introduced to the [facility] education and orientation packet was reviewed. Completed 6-minute walk test, measurements, initial ITP, and exercise prescription. Vital signs stable. Telemetry-SR with T wave inversion, noted on most recent 12 lead EKG; occ PVC, asymptomatic.   Service time was from 1006 to 1320.     Colbert Ewing, MS 12/19/2021 4:13 PM

## 2021-12-19 NOTE — Telephone Encounter (Signed)
Called Mr. Marone to set up home paramedicine visit for today at 10:00 and he reported he was at Cardiac Rehab and isn't sure when he will be done. He reports he would call me when he is heading home from same.   I asked him how he was feeling and he reported he felt like he was retaining fluid so he took 3 Lasix last night. I asked him if he is taking 3 tablets everyday or just this once and he says sometimes he takes 3 everyday. I advised him he was last instructed to take 2 tablets MWF and 1 tablet the other days. He said that doesn't help him and he has to take 3 tablets. I advised him I really would like to be able to see him in person to confirm his medications and complete a paramedicine assessment. When asked if he weighed today he said no. He stated he had not received his Remeron yet that Dr. Alvis Lemmings called in, I will check with pharmacy. He verbalized understanding. He advised he would call me once he got home.   As of 4:50 I have not heard from him and no answer on callback.   I will try to follow up in clinic on Monday at 10:20.   Call complete.   Maralyn Sago, EMT-Paramedic [phone] 12/19/2021

## 2021-12-20 NOTE — Progress Notes (Signed)
Cardiac Rehab Medication Review by a Pharmacist  Does the patient  feel that his/her medications are working for him/her?  He is unsure since it keeps changing  Has the patient been experiencing any side effects to the medications prescribed?  no   Does the patient have any problems obtaining medications due to transportation or finances?   Transportation yes, sometimes finances  Understanding of regimen: poor Understanding of indications: poor Potential of compliance: poor from not understanding dosages and changes with his medication regimen    Azam admits to feeling overwhelmed when changes are made.  He is unsure of what to do so he just takes what is in his pill dispenser. Mentioned his medications used to be bubble pack but because of the many changes this was hard for him to keep up.  Offered suggestion of taking each of his pill and take a picture of the bottle and the pill so when he may have to change he at least can tell which one he needs to decrease/increase or stop taking.  He is working with the paramedic team but often is not able to make these appts which then makes it more difficult.  He did not have his medications with him today to do Medication reconciliation however he did complete med rec with pharmacy tech on 11/29 as preparation for this appt.     Burnett Lieber Horald Chestnut RN 12/20/2021 12:49 PM

## 2021-12-23 ENCOUNTER — Encounter (HOSPITAL_COMMUNITY): Payer: Self-pay | Admitting: Cardiology

## 2021-12-23 ENCOUNTER — Other Ambulatory Visit (HOSPITAL_COMMUNITY): Payer: Self-pay

## 2021-12-23 ENCOUNTER — Telehealth (HOSPITAL_COMMUNITY): Payer: Self-pay

## 2021-12-23 ENCOUNTER — Ambulatory Visit (HOSPITAL_COMMUNITY)
Admission: RE | Admit: 2021-12-23 | Discharge: 2021-12-23 | Disposition: A | Discharge status: Home / Self Care | Payer: Medicaid Other | Source: Ambulatory Visit | Attending: Cardiology | Admitting: Cardiology

## 2021-12-23 VITALS — BP 120/70 | HR 73 | Wt 144.4 lb

## 2021-12-23 DIAGNOSIS — Z79899 Other long term (current) drug therapy: Secondary | ICD-10-CM | POA: Insufficient documentation

## 2021-12-23 DIAGNOSIS — I739 Peripheral vascular disease, unspecified: Secondary | ICD-10-CM | POA: Diagnosis not present

## 2021-12-23 DIAGNOSIS — Z95810 Presence of automatic (implantable) cardiac defibrillator: Secondary | ICD-10-CM | POA: Insufficient documentation

## 2021-12-23 DIAGNOSIS — I428 Other cardiomyopathies: Secondary | ICD-10-CM | POA: Insufficient documentation

## 2021-12-23 DIAGNOSIS — Z8249 Family history of ischemic heart disease and other diseases of the circulatory system: Secondary | ICD-10-CM | POA: Insufficient documentation

## 2021-12-23 DIAGNOSIS — I2510 Atherosclerotic heart disease of native coronary artery without angina pectoris: Secondary | ICD-10-CM | POA: Diagnosis not present

## 2021-12-23 DIAGNOSIS — Z8673 Personal history of transient ischemic attack (TIA), and cerebral infarction without residual deficits: Secondary | ICD-10-CM | POA: Insufficient documentation

## 2021-12-23 DIAGNOSIS — F17210 Nicotine dependence, cigarettes, uncomplicated: Secondary | ICD-10-CM | POA: Insufficient documentation

## 2021-12-23 DIAGNOSIS — I5022 Chronic systolic (congestive) heart failure: Secondary | ICD-10-CM | POA: Insufficient documentation

## 2021-12-23 DIAGNOSIS — Z823 Family history of stroke: Secondary | ICD-10-CM | POA: Insufficient documentation

## 2021-12-23 DIAGNOSIS — I110 Hypertensive heart disease with heart failure: Secondary | ICD-10-CM | POA: Insufficient documentation

## 2021-12-23 DIAGNOSIS — J449 Chronic obstructive pulmonary disease, unspecified: Secondary | ICD-10-CM | POA: Insufficient documentation

## 2021-12-23 LAB — BASIC METABOLIC PANEL
Anion gap: 11 (ref 5–15)
BUN: 21 mg/dL — ABNORMAL HIGH (ref 6–20)
CO2: 18 mmol/L — ABNORMAL LOW (ref 22–32)
Calcium: 8.8 mg/dL — ABNORMAL LOW (ref 8.9–10.3)
Chloride: 106 mmol/L (ref 98–111)
Creatinine, Ser: 1.19 mg/dL (ref 0.61–1.24)
GFR, Estimated: >60 mL/min (ref >60)
Glucose, Bld: 79 mg/dL (ref 70–99)
Potassium: 4 mmol/L (ref 3.5–5.1)
Sodium: 135 mmol/L (ref 135–145)

## 2021-12-23 LAB — DIGOXIN LEVEL: Digoxin Level: 0.2 ng/mL — ABNORMAL LOW (ref 0.8–2.0)

## 2021-12-23 MED ORDER — SPIRONOLACTONE 25 MG PO TABS: 12.5000 mg | ORAL_TABLET | Freq: Every evening | ORAL | 3 refills | Status: DC

## 2021-12-23 MED ORDER — VARENICLINE TARTRATE (STARTER) 0.5 MG X 11 & 1 MG X 42 PO TBPK: 0.5000 mg | ORAL_TABLET | ORAL | 0 refills | Status: DC

## 2021-12-23 NOTE — Patient Instructions (Signed)
START Spironolactone 12.5 mg ( 1/2 Tab) nightly.  START Chantix as directed on box.  Labs done today, your results will be available in MyChart, we will contact you for abnormal readings.  Repeat blood work in 10 days.  Your provider has ordered scan of your legs. You will be called to have this test arranged.  Your physician recommends that you schedule a follow-up appointment as scheduled.  If you have any questions or concerns before your next appointment please send [name] a message through [place] or call our office at [phone].    TO LEAVE A MESSAGE FOR THE NURSE SELECT OPTION 2, PLEASE LEAVE A MESSAGE INCLUDING: YOUR NAME DATE OF BIRTH CALL BACK NUMBER REASON FOR CALL**this is important as we prioritize the call backs  YOU WILL RECEIVE A CALL BACK THE SAME DAY AS LONG AS YOU CALL BEFORE 4:00 PM  At the [facility], you and your health needs are our priority. As part of our continuing mission to provide you with exceptional heart care, we have created designated Provider Care Teams. These Care Teams include your primary Cardiologist (physician) and Advanced Practice Providers (APPs- Physician Assistants and Nurse Practitioners) who all work together to provide you with the care you need, when you need it.   You may see any of the following providers on your designated Care Team at your next follow up: Dr Arvilla Meres Dr Marca Ancona Dr. Marcos Eke, NP Robbie Lis, [place] [facility] [place], [place] Brynda Peon, NP Karle Plumber, PharmD   Please be sure to bring in all your medications bottles to every appointment.

## 2021-12-23 NOTE — Telephone Encounter (Signed)
After labs- medications verified and sent list to [facility] for bubble packs as requested by patient.   Morning: Aspirin 81mg   Atorvastatin 40mg  Farxiga 10mg  Digoxin 0.125mg   Entresto 24-26mg  Furosemide 60mg   Magnesium   Evening: Entresto 24-26mg  Spironolactone 12.5mg  Metoprolol 100mg   Outside of Bubble Packs: -Remeron -Albuterol -Advair -Chantix   Same relayed to patient that bubble packs will be delivered someitme this week. He has medications at home to get him through a few days until delivery.   , EMT-Paramedic [phone] 12/23/2021

## 2021-12-23 NOTE — Progress Notes (Signed)
PCP: Default, Provider, MD HF Cardiology: Dr. Aundra Wells  56 y.o. with history of nonischemic cardiomyopathy was referred by [name] for evaluation of CHF.  Cardiomyopathy has been diagnosed since 2016 when echo showed EF 20%.  He has a history of cocaine use and ETOH abuse. Currently, drinking about 3 beers/day.  He is smoking.  No recent cocaine.  He had a stroke in 2017 in setting of uncontrolled HTN and cocaine abuse.  He has a Research officer, political party ICD.  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  Last echo in 2/23 showed EF < 20%, severe LV dilation, moderately decreased RV systolic function with mild RV enlargement. He has 13 siblings, none of whom have known cardiac disease.  His grandmother had CHF and there apparently was conversation about a heart transplant but she passed away.   Follow up 23-Sep-2022 with Dr. Aundra Wells, NYHA II-III, volume stable. CPX, cMRI and evaluation for BAT arranged.   CPX (9/23) showed severe functional limitation due to HF and deconditioning.   Follow up 9/23, NYHA III-IIIb , labs showed AKI and GDMT held. Required a dose of Lokelma for hyperkalemia.  Cardiac MRI 10/23 (difficult images due to ICD artifact) LVEF 24%, RVEF 34%.  Today he returns for HF follow up. He is still smoking.  He is drinking about 3 drinks twice a week.  No cocaine.  No dyspnea walking on flat ground.  He gets short of breath walking longer distances.  He is short of breath with stairs. He notes burning in his calves when walking up stairs.  Occasional atypical chest pain, not exertional.  Mild lightheadedness with standing occasionally.  He is going to cardiac rehab.   St Jude device interrogation (personally reviewed): CorVue stable, no VT  ECG (personally reviewed): NSR, LVH with repolarization.   Labs (6/23): hgb 15.6, BNP 673, K 4.2, creatinine 1.23 Labs 23-Sep-2022): K 3.9, creatinine 1.13 Labs (9/23): K 4.8, creatinine 1.53 Labs (10/23): K 3.6, creatinine 1.07 Labs (11/23): K 4.3,  creatinine 1.4, BNP 152  PMH: 1. H/o seizure disorder: ?ETOH. 2. HTN 3. 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  4. H/o SVT 5. OSA 6. COPD: Active smoker 7. VT: St Jude ICD.  ATP 6/23.  8. Chronic systolic CHF: Nonischemic cardiomyopathy.  Documented since at least 2016. St Jude ICD.  - Echo (3/16): EF 20% - Echo (2/23): EF < 20%, severe LV dilation, moderately decreased RV systolic function with mild RV enlargement.  - Coronary CTA (6/23): 81st percentile calcium score, mild nonobstructive CAD.  - CPX (9/23): Peak VO2: 14.3 (43% predicted peak VO2), VE/VCO2 slope: 39, OUES: 1.10, Peak RER: 0.70  - Cardiac MRI 10/23 (difficult images due to ICD artifact) LVEF 24%, RVEF 34%. 9. Prior h/o cocaine  Social History   Socioeconomic History   Marital status: Single    Spouse name: Not on file   Number of children: 1   Years of education: 12   Highest education level: Not on file  Occupational History   Not on file  Tobacco Use   Smoking status: Every Day    Packs/day: 1.00    Years: 20.00    Total pack years: 20.00    Types: Cigarettes   Smokeless tobacco: Never   Tobacco comments:    05/14/16 smoking 1 PPD  Vaping Use   Vaping Use: Never used  Substance and Sexual Activity   Alcohol use: Yes    Alcohol/week: 1.0 standard drink of alcohol    Types: 1 Cans  of beer per week    Comment: socially    Drug use: Not Currently    Types: Cocaine    Comment: stopped using cocaine 11/19/14   Sexual activity: Not on file  Other Topics Concern   Not on file  Social History Narrative   Lives alone, nurse comes 2 hrs daily   Social Determinants of Health   Financial Resource Strain: High Risk (11/14/2021)   Overall Financial Resource Strain (CARDIA)    Difficulty of Paying Living Expenses: Hard  Food Insecurity: Food Insecurity Present (09/26/2021)   Hunger Vital Sign    Worried About Programme researcher, broadcasting/film/video in the Last Year: Often true    Ran Out of Food in the Last Year:  Sometimes true  Transportation Needs: Unmet Transportation Needs (11/20/2021)   PRAPARE - Administrator, Civil Service (Medical): Yes    Lack of Transportation (Non-Medical): Yes  Physical Activity: Not on file  Stress: Not on file  Social Connections: Not on file  Intimate Partner Violence: Not on file   Family History  Problem Relation Age of Onset   Hypertension Mother    Hyperlipidemia Mother    Hypertension Father    Stroke Maternal Aunt    Hypertension Sister    Hypertension Brother    ROS: All systems reviewed and negative except as per HPI.   Current Outpatient Medications  Medication Sig Dispense Refill   albuterol (PROAIR HFA) 108 (90 Base) MCG/ACT inhaler Inhale 2 puffs into the lungs every 6 (six) hours as needed for shortness of breath. 18 g 6   aspirin 81 MG EC tablet Take 81 mg by mouth in the morning. Swallow whole.     atorvastatin (LIPITOR) 40 MG tablet Take 1 tablet (40 mg total) by mouth daily. 90 tablet 1   cetirizine (ZYRTEC) 10 MG tablet TAKE 1 TABLET (10 MG TOTAL) BY MOUTH DAILY. 30 tablet 0   dapagliflozin propanediol (FARXIGA) 10 MG TABS tablet Take 1 tablet (10 mg total) by mouth daily before breakfast. 30 tablet 11   digoxin (LANOXIN) 0.125 MG tablet Take 1 tablet (0.125 mg total) by mouth daily. 90 tablet 1   fluticasone-salmeterol (ADVAIR DISKUS) 250-50 MCG/ACT AEPB Inhale 1 puff into the lungs in the morning and at bedtime. 1 each 3   furosemide (LASIX) 20 MG tablet Take 1 tablet (20 mg total) by mouth every other day alternating with 2 tablets (40 mg total) every other day. 135 tablet 2   MAGNESIUM OXIDE -MG SUPPLEMENT PO Take 1 tablet by mouth in the morning.     metoprolol succinate (TOPROL-XL) 100 MG 24 hr tablet Take 1 tablet (100 mg total) by mouth at bedtime. 90 tablet 3   mirtazapine (REMERON SOL-TAB) 15 MG disintegrating tablet Take 1 tablet (15 mg total) by mouth at bedtime. 90 tablet 1   sacubitril-valsartan (ENTRESTO) 24-26 MG  Take 1 tablet by mouth 2 (two) times daily. 60 tablet 6   sodium zirconium cyclosilicate (LOKELMA) 10 g PACK packet Take 10 g by mouth as needed.     spironolactone (ALDACTONE) 25 MG tablet Take 0.5 tablets (12.5 mg total) by mouth at bedtime. 45 tablet 3   tiotropium (SPIRIVA HANDIHALER) 18 MCG inhalation capsule Place 1 capsule (18 mcg total) into inhaler and inhale daily. 30 capsule 6   Varenicline Tartrate, Starter, (CHANTIX STARTING MONTH PAK) 0.5 MG X 11 & 1 MG X 42 TBPK Take 0.5 mg by mouth as directed. 53 each 0  No current facility-administered medications for this encounter.   Wt Readings from Last 3 Encounters:  12/23/21 65.5 kg (144 lb 6.4 oz)  12/19/21 65.2 kg (143 lb 11.8 oz)  12/11/21 65.6 kg (144 lb 9.6 oz)   BP 120/70   Pulse 73   Wt 65.5 kg (144 lb 6.4 oz)   SpO2 96%   BMI 21.02 kg/m  General: NAD Neck: No JVD, no thyromegaly or thyroid nodule.  Lungs: Clear to auscultation bilaterally with normal respiratory effort. CV: Nondisplaced PMI.  Heart regular S1/S2, no S3/S4, no murmur.  No peripheral edema.  No carotid bruit.  Unable to palpate right pedal pulses.  Abdomen: Soft, nontender, no hepatosplenomegaly, no distention.  Skin: Intact without lesions or rashes.  Neurologic: Alert and oriented x 3.  Psych: Normal affect. Extremities: No clubbing or cyanosis.  HEENT: Normal.   Assessment/Plan: 1. Chronic systolic CHF: Nonischemic cardiomyopathy.  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  Cause of CMP uncertain => prior cocaine abuse but not currently.  He drinks about 3 beers/day, denies heavier drinking in past but apparently there was concern that he had had ETOH withdrawal seizures in the past.  His grandmother had CHF but apparently none of his 13 siblings have known cardiac disease. EF has been low since at least 2016.  Most recent echo in 2/23 showed EF < 20%, severe LV dilation, moderately decreased RV systolic function with mild RV enlargement. He  has a Secondary school teacher ICD.  CPX 9/23 showed severe functional limitation from both HF and deconditioning. Cardiac MRI 10/23, technically difficult study due to ICD artifact, LVEF 24%, no evidence of infiltrative disease. NYHA class II-III symptoms, getting better with cardiac rehab.  He is euvolemic on exam & Corvue.   - Continue Farxiga 10 mg daily. - He is taking Lasix 60 mg daily, will continue this dose as long as creatinine stable as he looks euvolemic.  BMET/BNP today.  - Continue digoxin 0.125 mg daily. Check dig level today. - Continue Entresto 24/26 mg bid.  - Continue Toprol XL 100 mg daily. - Start spironolactone 12.5 mg daily.  BMET in 10 days. - Continue to cut back on ETOH. - Bifurcation too high for [employer], working on getting Medicaid approval for commercial baroreceptor activation therapy.  - We briefly discussed advanced therapies. Not transplant candidate with on-going tobacco use.  Would likely be VAD candidate if needed.  2. COPD: He has cut back but is still smoking.  Failed nicotine patches.  - I will try him on Chantix.  3. ?Claudication: Calf pain walking up stairs.  - I will arrange for ABIs.   See HF pharmacist in 3 wks (med titration) and APP in 6 wks.   Marca Ancona 12/23/2021

## 2021-12-23 NOTE — Progress Notes (Signed)
Paramedicine Encounter    Patient ID: [name], male    DOB: May 23, 1965, 57 y.o.   MRN: [id number]   Met with [name] in clinic today where he was seen by Dr. Aundra Dubin. He reports feeling okay but sometimes feeling short of breath with long distance walks, stairs and sometimes feeling fluid overloaded at night.   He has been taking his medications, he has been taking his Lasix differently than prescribed, he has been using 3- 41m tablets (624m daily. Dr. McAundra Dubinpproved this as long as labs are normal today going forward.   Dr. McAundra Dubindded the following meds: 12.73m20mpironolactone (evening) Chantix for smoking cessation/  Labs drawn today and pending med changes patient wants to proceed with bubble packs. I will facilitate this for him with [facility] where he is currently getting his medications filled.   Once labs result I will send updated list to [facility] to form bubble packs and they will be delivered this week.   Dr. McLAundra Dubinnts ultrasounds of both legs for [name] he complains of leg weakness and pain with diminished pedal pulses.    I plan to follow up with [name] medication management and home follow up in one week. He agreed with plans.   Visit complete.      ACTION: Home visit completed

## 2021-12-24 ENCOUNTER — Other Ambulatory Visit: Payer: Medicaid Other | Admitting: *Deleted

## 2021-12-24 ENCOUNTER — Encounter: Payer: Self-pay | Admitting: *Deleted

## 2021-12-24 ENCOUNTER — Other Ambulatory Visit (HOSPITAL_COMMUNITY): Payer: Self-pay | Admitting: Cardiology

## 2021-12-24 DIAGNOSIS — I739 Peripheral vascular disease, unspecified: Secondary | ICD-10-CM

## 2021-12-24 NOTE — Patient Instructions (Signed)
Visit Information  [name] was given information about Medicaid Managed Care team care coordination services as a part of their Healthy [facility] Medicaid benefit. [name] verbally consented to engagement with the [facility] Managed Care team.   If you are experiencing a medical emergency, please call 911 or report to your local emergency department or urgent care.   If you have a non-emergency medical problem during routine business hours, please contact your provider's office and ask to speak with a nurse.   For questions related to your Healthy [facility] health plan, please call: [phone] or visit the homepage here: [email or web address]  If you would like to schedule transportation through your Healthy [facility] plan, please call the following number at least 2 days in advance of your appointment: [phone]  For information about your ride after you set it up, call Ride Assist at [phone]. Use this number to activate a Will Call pickup, or if your transportation is late for a scheduled pickup. Use this number, too, if you need to make a change or cancel a previously scheduled reservation.  If you need transportation services right away, call [phone]. The after-hours call center is staffed 24 hours to handle ride assistance and urgent reservation requests (including discharges) 365 days a year. Urgent trips include sick visits, hospital discharge requests and life-sustaining treatment.  Call the [facility] Line at [phone], at any time, 24 hours a day, 7 days a week. If you are in danger or need immediate medical attention call 911.  If you would like help to quit smoking, call 1-800-QUIT-NOW (559 220 3653) OR Espaol: 1-855-Djelo-Ya (1-751-025-8527) o para ms informacin haga clic aqu or Text READY to 782-423 to register via text  [name],   Please see education materials related to CHF provided by  MyChart link.  Patient verbalizes understanding of instructions and care plan provided today and agrees to view in MyChart. Active MyChart status and patient understanding of how to access instructions and care plan via MyChart confirmed with patient.     Telephone follow up appointment with Managed Medicaid care management team member scheduled for:02/25/22 @ 2:30 pm  [name] RN, BSN [place]  [facility] RN Care Coordinator   Following is a copy of your plan of care:  Care Plan : RN Care Manager Plan of Care  Updates made by Heidi Dach, RN since 12/24/2021 12:00 AM     Problem: Health Management needs related to CHF      Long-Range Goal: Development of Plan of Care to address Health Management needs related to CHF   Start Date: 05/16/2021  Expected End Date: 03/20/2022  Priority: High  Note:   Current Barriers:  Chronic Disease Management support and education needs related to CHF [name] is scheduled for Cardiac Rehab on M/W/F. He is checking his weight daily, working with Paramedicine for medication management and working to cut back on smoking and drinking.  RNCM Clinical Goal(s):  Patient will verbalize understanding of plan for management of CHF as evidenced by patient reports take all medications exactly as prescribed and will call provider for medication related questions as evidenced by patient reports and documentation in EMR    attend all scheduled medical appointments: Cardiac Rehab M/W/F and Imaging on 12/31/21 as evidenced by provider documentation in EMR        work with community resource care guide to address needs related to Limited access to food and Level of care concerns as evidenced by  patient and/or community resource care guide support    through collaboration with Medical illustrator, provider, and care team.   Interventions: Inter-disciplinary care team collaboration (see longitudinal plan of care) Evaluation of current treatment  plan related to  self management and patient's adherence to plan as established by provider  Provided patient with [facility] [phone], call to reschedule missed appointment-patient declines, does not want to have colonoscopy   Heart Failure Interventions:  (Status: Goal on Track (progressing): YES.)  Long Term Goal  Wt Readings from Last 3 Encounters:  10/23/21 144 lb (65.3 kg)  10/21/21 147 lb (66.7 kg)  10/18/21 148 lb (67.1 kg)   Discussed importance of daily weight and advised patient to weigh and record daily Reviewed role of diuretics in prevention of fluid overload and management of heart failure Discussed the importance of keeping all appointments with provider Provided patient with education about the role of exercise in the management of heart failure Advised patient to discuss any concerns or questions with provider Assessed social determinant of health barriers Reviewed upcoming appointments, patient aware and has transportation arranged Discussed cardiac rehab, patient is working to improve his heart health  Patient Goals/Self-Care Activities: Take medications as prescribed   Attend all scheduled provider appointments Call pharmacy for medication refills 3-7 days in advance of running out of medications Call provider office for new concerns or questions  use salt in moderation watch for swelling in feet, ankles and legs every day eat more whole grains, fruits and vegetables, lean meats and healthy fats

## 2021-12-24 NOTE — Patient Outreach (Signed)
Medicaid Managed Care   Nurse Care Manager Note  12/24/2021 Name:  [name] MRN:  [id number] DOB:  [birth year]  [name] is an 56 y.o. year old male who is a primary patient of Default, Provider, MD.  The Medicaid Managed Care Coordination team was consulted for assistance with:    CHF  [name] was given information about Medicaid Managed Care Coordination team services today. [name] Patient agreed to services and verbal consent obtained.  Engaged with patient by telephone for follow up visit in response to provider referral for case management and/or care coordination services.   Assessments/Interventions:  Review of past medical history, allergies, medications, health status, including review of consultants reports, laboratory and other test data, was performed as part of comprehensive evaluation and provision of chronic care management services.  SDOH (Social Determinants of Health) assessments and interventions performed: SDOH Interventions    Flowsheet Row Patient Outreach Telephone from 12/24/2021 in [place] Telephone from 11/20/2021 in [place] Telephone from 11/14/2021 in [place] Telephone from 10/30/2021 in [place] Patient Outreach Telephone from 10/25/2021 in [place] Coordination Telephone from 10/07/2021 in [place]. [facility]  SDOH Interventions        Food Insecurity Interventions Intervention Not Indicated -- -- -- -- --  Housing Interventions -- -- -- -- Intervention Not Indicated --  Transportation Interventions Intervention Not Indicated Payor Benefit -- Other (Comment)  [kaisen uber] -- Other (Comment)  [[place], too late to arrange ride benefit ride]  Utilities Interventions Intervention Not Indicated  -- -- -- -- --  Financial Strain Interventions -- -- Other (Comment)  [patient care fund to help with phone bill] -- -- --       Care Plan  Allergies  Allergen Reactions   Penicillins Other (See Comments)    Blisters  Has patient had a PCN reaction causing immediate rash, facial/tongue/throat swelling, SOB or lightheadedness with hypotension: Yes Has patient had a PCN reaction causing severe rash involving mucus membranes or skin necrosis: Yes Has patient had a PCN reaction that required hospitalization: No Has patient had a PCN reaction occurring within the last 10 years: No If all of the above answers are "NO", then may proceed with Cephalosporin use.     Medications Reviewed Today     Reviewed by [name], RN (Registered Nurse) on 12/24/21 at [place] List Status: <None>   Medication Order Taking? Sig Documenting Provider Last Dose Status Informant  albuterol (PROAIR HFA) 108 (90 Base) MCG/ACT inhaler [id number] No Inhale 2 puffs into the lungs every 6 (six) hours as needed for shortness of breath. [name], MD Taking Active Self  aspirin 81 MG EC tablet [id number] No Take 81 mg by mouth in the morning. Swallow whole. [provider] Taking Active Self  atorvastatin (LIPITOR) 40 MG tablet [id number] No Take 1 tablet (40 mg total) by mouth daily. [name], MD Taking Active Self  cetirizine (ZYRTEC) 10 MG tablet [id number] No TAKE 1 TABLET (10 MG TOTAL) BY MOUTH DAILY. [name], MD Taking Active Self  dapagliflozin propanediol (FARXIGA) 10 MG TABS tablet [id number] No Take 1 tablet (10 mg total) by mouth daily before breakfast. Rafael Bihari, FNP Taking Active Self  digoxin (LANOXIN) 0.125 MG tablet [id number] No Take 1  tablet (0.125 mg total) by mouth daily. [place], Maricela Bo, FNP Taking Active Self  fluticasone-salmeterol (ADVAIR DISKUS) 250-50 MCG/ACT AEPB [id number] No Inhale 1 puff into the lungs in the morning and at bedtime. [name],  MD Taking Active Self  furosemide (LASIX) 20 MG tablet [id number] No Take 1 tablet (20 mg total) by mouth every other day alternating with 2 tablets (40 mg total) every other day. [place], Hubbard, FNP Taking Active Self  MAGNESIUM OXIDE -MG SUPPLEMENT PO [id number] No Take 1 tablet by mouth in the morning. [provider] Taking Active Self  metoprolol succinate (TOPROL-XL) 100 MG 24 hr tablet [id number] No Take 1 tablet (100 mg total) by mouth at bedtime. Larey Dresser, MD Taking Active Self  mirtazapine (REMERON SOL-TAB) 15 MG disintegrating tablet [id number] No Take 1 tablet (15 mg total) by mouth at bedtime. [name], MD Taking Active Self  sacubitril-valsartan (ENTRESTO) 24-26 MG [id number] No Take 1 tablet by mouth 2 (two) times daily. [place], Maricela Bo, FNP Taking Active Self  sodium zirconium cyclosilicate (LOKELMA) 10 g PACK packet [id number] No Take 10 g by mouth as needed. [provider] Taking Active   spironolactone (ALDACTONE) 25 MG tablet [id number]  Take 0.5 tablets (12.5 mg total) by mouth at bedtime. Larey Dresser, MD  Active   tiotropium [facility] HANDIHALER) 18 MCG inhalation capsule [id number] No Place 1 capsule (18 mcg total) into inhaler and inhale daily. [name], MD Taking Active Self  Varenicline Tartrate, Starter, (CHANTIX STARTING MONTH PAK) 0.5 MG X 11 & 1 MG X 42 TBPK [id number]  Take 0.5 mg by mouth as directed. Larey Dresser, MD  Active             Patient Active Problem List   Diagnosis Date Noted   Acute combined systolic and diastolic congestive heart failure ([place])    AKI (acute kidney injury) ([place])    Acute exacerbation of CHF (congestive heart failure) ([place]) 03/05/2021   SVT (supraventricular tachycardia) 06/15/2020   Slurred speech 07/05/2019   ICD (implantable cardioverter-defibrillator) in place 07/20/2018   COPD (chronic obstructive pulmonary disease) ([place]) 12/09/2017   Obstructive sleep apnea 09/08/2016    Autonomic dysfunction 07/15/2015   DJD (degenerative joint disease), lumbar 06/19/2015   Tremor 05/14/2015   Abnormality of gait 05/14/2015   Neurogenic bladder 04/17/2015   Lumbar strain 03/23/2015   Leg weakness 03/23/2015   Major depression, chronic    History of stroke 02/26/2015   Benign essential HTN    ETOH abuse    Tobacco abuse    Chronic combined systolic and diastolic heart failure ([place])    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 Barbie is scheduled for Cardiac Rehab on M/W/F. He is checking his weight daily, working with Paramedicine for medication management and working to cut back on smoking and drinking.  RNCM Clinical Goal(s):  Patient will verbalize understanding of plan for management of CHF as evidenced by patient reports take all medications exactly as prescribed and will call provider for medication related questions as evidenced by patient reports and documentation in EMR    attend all scheduled medical appointments: Cardiac Rehab M/W/F and Imaging on 12/31/21 as evidenced by provider documentation in EMR        work with community resource care guide to address needs related to Limited access to food and Level of care concerns as evidenced by patient and/or community resource care guide support    through collaboration with Medical illustrator, provider, and care team.    Interventions: Inter-disciplinary care team collaboration (see longitudinal plan of care) Evaluation of current treatment plan related to  self management and patient's adherence to plan as established by provider  Provided patient with [facility] [phone], call to reschedule missed appointment-patient declines, does not want to have colonoscopy   Heart Failure Interventions:  (Status: Goal on Track (progressing): YES.)  Long Term Goal  Wt Readings from Last 3 Encounters:  10/23/21 144 lb (65.3 kg)  10/21/21 147 lb (66.7 kg)  10/18/21 148 lb (67.1 kg)   Discussed importance of daily weight and advised patient to weigh and record daily Reviewed role of diuretics in prevention of fluid overload and management of heart failure Discussed the importance of keeping all appointments with provider Provided patient with education about the role of exercise in the management of heart failure Advised patient to discuss any concerns or questions with provider Assessed social determinant of health barriers Reviewed upcoming appointments, patient aware and has transportation arranged Discussed cardiac rehab, patient is working to improve his heart health  Patient Goals/Self-Care Activities: Take medications as prescribed   Attend all scheduled provider appointments Call pharmacy for medication refills 3-7 days in advance of running out of medications Call provider office for new concerns or questions  use salt in moderation watch for swelling in feet, ankles and legs every day eat more whole grains, fruits and vegetables, lean meats and healthy fats       Follow Up:  Patient agrees to Care Plan and Follow-up.  Plan: The Managed Medicaid care management team will reach out to the patient again over the next 60 days.  Date/time of next scheduled RN care management/care coordination outreach:  02/25/22 @ 2:30 pm  [name] RN, BSN [place]  [facility] RN Care  Coordinator

## 2021-12-25 ENCOUNTER — Encounter (HOSPITAL_COMMUNITY)
Admission: RE | Admit: 2021-12-25 | Discharge: 2021-12-25 | Disposition: A | Discharge status: Home / Self Care | Payer: Medicaid Other | Source: Ambulatory Visit | Attending: Cardiology | Admitting: Cardiology

## 2021-12-25 ENCOUNTER — Telehealth (HOSPITAL_COMMUNITY): Payer: Self-pay

## 2021-12-25 ENCOUNTER — Telehealth (HOSPITAL_COMMUNITY): Payer: Self-pay | Admitting: *Deleted

## 2021-12-25 DIAGNOSIS — I5022 Chronic systolic (congestive) heart failure: Secondary | ICD-10-CM | POA: Insufficient documentation

## 2021-12-25 NOTE — Progress Notes (Signed)
Cardiac Individual Treatment Plan  Patient Details  Name: [name] MRN: [id number] Date of Birth: Apr 16, 1965 Referring Provider:   Flowsheet Row CARDIAC REHAB PHASE II ORIENTATION from 12/19/2021 in [facility] [place]  Referring Provider Loralie Champagne, MD       Initial Encounter Date:  [place] from 12/19/2021 in [facility]  Date 12/19/21       Visit Diagnosis: Heart failure, chronic systolic ([place])  Patient's Home Medications on Admission:  Current Outpatient Medications:    albuterol (PROAIR HFA) 108 (90 Base) MCG/ACT inhaler, Inhale 2 puffs into the lungs every 6 (six) hours as needed for shortness of breath., Disp: 18 g, Rfl: 6   aspirin 81 MG EC tablet, Take 81 mg by mouth in the morning. Swallow whole., Disp: , Rfl:    atorvastatin (LIPITOR) 40 MG tablet, Take 1 tablet (40 mg total) by mouth daily., Disp: 90 tablet, Rfl: 1   cetirizine (ZYRTEC) 10 MG tablet, TAKE 1 TABLET (10 MG TOTAL) BY MOUTH DAILY., Disp: 30 tablet, Rfl: 0   dapagliflozin propanediol (FARXIGA) 10 MG TABS tablet, Take 1 tablet (10 mg total) by mouth daily before breakfast., Disp: 30 tablet, Rfl: 11   digoxin (LANOXIN) 0.125 MG tablet, Take 1 tablet (0.125 mg total) by mouth daily., Disp: 90 tablet, Rfl: 1   fluticasone-salmeterol (ADVAIR DISKUS) 250-50 MCG/ACT AEPB, Inhale 1 puff into the lungs in the morning and at bedtime., Disp: 1 each, Rfl: 3   furosemide (LASIX) 20 MG tablet, Take 1 tablet (20 mg total) by mouth every other day alternating with 2 tablets (40 mg total) every other day., Disp: 135 tablet, Rfl: 2   MAGNESIUM OXIDE -MG SUPPLEMENT PO, Take 1 tablet by mouth in the morning., Disp: , Rfl:    metoprolol succinate (TOPROL-XL) 100 MG 24 hr tablet, Take 1 tablet (100 mg total) by mouth at bedtime., Disp: 90 tablet, Rfl: 3   mirtazapine  (REMERON SOL-TAB) 15 MG disintegrating tablet, Take 1 tablet (15 mg total) by mouth at bedtime., Disp: 90 tablet, Rfl: 1   sacubitril-valsartan (ENTRESTO) 24-26 MG, Take 1 tablet by mouth 2 (two) times daily., Disp: 60 tablet, Rfl: 6   sodium zirconium cyclosilicate (LOKELMA) 10 g PACK packet, Take 10 g by mouth as needed., Disp: , Rfl:    spironolactone (ALDACTONE) 25 MG tablet, Take 0.5 tablets (12.5 mg total) by mouth at bedtime., Disp: 45 tablet, Rfl: 3   tiotropium (SPIRIVA HANDIHALER) 18 MCG inhalation capsule, Place 1 capsule (18 mcg total) into inhaler and inhale daily., Disp: 30 capsule, Rfl: 6   Varenicline Tartrate, Starter, (CHANTIX STARTING MONTH PAK) 0.5 MG X 11 & 1 MG X 42 TBPK, Take 0.5 mg by mouth as directed., Disp: 62 each, Rfl: 0  Past Medical History: Past Medical History:  Diagnosis Date   Accelerated hypertension 12/06/2014   CHF (congestive heart failure) (HCC)    CHF exacerbation ([place]) 12/22/2016   Cocaine abuse ([place])    Headache    Hypertension    Stroke ([place]) 02/24/2015   SVT (supraventricular tachycardia) 06/15/2020   Thrombocytopenia ([place]) 07/31/2017    Tobacco Use: Social History   Tobacco Use  Smoking Status Every Day   Packs/day: 1.00   Years: 20.00   Total pack years: 20.00   Types: Cigarettes  Smokeless Tobacco Never  Tobacco Comments   05/14/16 smoking 1 PPD  Labs: Review Flowsheet  More data exists      Latest Ref Rng & Units 09/10/2016 07/30/2017 10/04/2018 06/03/2019 03/05/2021  Labs for ITP Cardiac and Pulmonary Rehab  Cholestrol 100 - 199 mg/dL 916  - 945  - -  LDL (calc) 0 - 99 mg/dL 44  - 62  - -  HDL-C >03 mg/dL 888  - 81  - -  Trlycerides 0 - 149 mg/dL 69  - 280  - -  Hemoglobin A1c 4.8 - 5.6 % - - - - 5.0   PH, Arterial 7.350 - 7.450 - 7.415  7.235  - - -  PCO2 arterial 32.0 - 48.0 mmHg - 31.1  53.2  - - -  Bicarbonate 20.0 - 28.0 mmol/L - 19.9  22.5  - 25.8  26.5  -  TCO2 22 - 32 mmol/L - 21  24  - 27  28  -  Acid-base deficit 0.0  - 2.0 mmol/L - 4.0  5.0  - - -  O2 Saturation % - 100.0  100.0  - 74.0  69.0  -    Capillary Blood Glucose: Lab Results  Component Value Date   GLUCAP 81 05/23/2020   GLUCAP 93 07/30/2017   GLUCAP 72 07/30/2017   GLUCAP 97 07/30/2017   GLUCAP 61 (L) 07/30/2017     Exercise Target Goals: Exercise Program Goal: Individual exercise prescription set using results from initial 6 min walk test and THRR while considering  patient's activity barriers and safety.   Exercise Prescription Goal: Initial exercise prescription builds to 30-45 minutes a day of aerobic activity, 2-3 days per week.  Home exercise guidelines will be given to patient during program as part of exercise prescription that the participant will acknowledge.  Activity Barriers & Risk Stratification:  Activity Barriers & Cardiac Risk Stratification - 12/19/21 1227       Activity Barriers & Cardiac Risk Stratification   Activity Barriers Deconditioning;Shortness of Breath;Decreased Ventricular Function;Balance Concerns;History of Falls    Cardiac Risk Stratification High             6 Minute Walk:  6 Minute Walk     Row Name 12/19/21 1242         6 Minute Walk   Phase Initial     Distance 1145 feet     Walk Time 6 minutes     # of Rest Breaks 0     MPH 2.2     METS 3.62     RPE 13     Perceived Dyspnea  1     VO2 Peak 12.7     Symptoms Yes (comment)     Comments Burning in the thighs, no pain     Resting HR 88 bpm     Resting BP 90/57     Resting Oxygen Saturation  97 %     Exercise Oxygen Saturation  during 6 min walk 98 %     Max Ex. HR 102 bpm     Max Ex. BP 98/68     2 Minute Post BP 95/65              Oxygen Initial Assessment:   Oxygen Re-Evaluation:   Oxygen Discharge (Final Oxygen Re-Evaluation):   Initial Exercise Prescription:  Initial Exercise Prescription - 12/19/21 1400       Date of Initial Exercise RX and Referring Provider   Date 12/19/21    Referring Provider  Marca Ancona, MD    Expected Discharge  Date 02/14/22      NuStep   Level 2    SPM 75    Minutes 15    METs 3.6      Arm Ergometer   Level 1.5    Watts 40    RPM 60    Minutes 15    METs 3.6      Prescription Details   Frequency (times per week) 3    Duration Progress to 30 minutes of continuous aerobic without signs/symptoms of physical distress      Intensity   THRR 40-80% of Max Heartrate 66-132    Ratings of Perceived Exertion 11-13    Perceived Dyspnea 0-4      Progression   Progression Continue progressive overload as per policy without signs/symptoms or physical distress.      Resistance Training   Training Prescription Yes    Weight 3 lbs    Reps 10-15             Perform Capillary Blood Glucose checks as needed.  Exercise Prescription Changes:   Exercise Comments:   Exercise Goals and Review:   Exercise Goals     Row Name 12/19/21 1228             Exercise Goals   Increase Physical Activity Yes       Intervention Provide advice, education, support and counseling about physical activity/exercise needs.;Develop an individualized exercise prescription for aerobic and resistive training based on initial evaluation findings, risk stratification, comorbidities and participant's personal goals.       Expected Outcomes Short Term: Attend rehab on a regular basis to increase amount of physical activity.;Long Term: Add in home exercise to make exercise part of routine and to increase amount of physical activity.;Long Term: Exercising regularly at least 3-5 days a week.       Increase Strength and Stamina Yes       Intervention Provide advice, education, support and counseling about physical activity/exercise needs.;Develop an individualized exercise prescription for aerobic and resistive training based on initial evaluation findings, risk stratification, comorbidities and participant's personal goals.       Expected Outcomes Short Term: Increase workloads  from initial exercise prescription for resistance, speed, and METs.;Short Term: Perform resistance training exercises routinely during rehab and add in resistance training at home;Long Term: Improve cardiorespiratory fitness, muscular endurance and strength as measured by increased METs and functional capacity (6MWT)       Able to understand and use rate of perceived exertion (RPE) scale Yes       Intervention Provide education and explanation on how to use RPE scale       Expected Outcomes Short Term: Able to use RPE daily in rehab to express subjective intensity level;Long Term:  Able to use RPE to guide intensity level when exercising independently       Knowledge and understanding of Target Heart Rate Range (THRR) Yes       Intervention Provide education and explanation of THRR including how the numbers were predicted and where they are located for reference       Expected Outcomes Short Term: Able to state/look up THRR;Short Term: Able to use daily as guideline for intensity in rehab;Long Term: Able to use THRR to govern intensity when exercising independently       Understanding of Exercise Prescription Yes       Intervention Provide education, explanation, and written materials on patient's individual exercise prescription       Expected Outcomes  Short Term: Able to explain program exercise prescription;Long Term: Able to explain home exercise prescription to exercise independently                Exercise Goals Re-Evaluation :   Discharge Exercise Prescription (Final Exercise Prescription Changes):   Nutrition:  Target Goals: Understanding of nutrition guidelines, daily intake of sodium 1500mg , cholesterol 200mg , calories 30% from fat and 7% or less from saturated fats, daily to have 5 or more servings of fruits and vegetables.  Biometrics:  Pre Biometrics - 12/19/21 1042       Pre Biometrics   Waist Circumference 34 inches    Hip Circumference 36 inches    Waist to Hip  Ratio 0.94 %    Triceps Skinfold 5 mm    % Body Fat 17.4 %    Grip Strength 38 kg    Flexibility 0 in   Could not reach   Single Leg Stand 5.68 seconds              Nutrition Therapy Plan and Nutrition Goals:   Nutrition Assessments:  MEDIFICTS Score Key: ?70 Need to make dietary changes  40-70 Heart Healthy Diet ? 40 Therapeutic Level Cholesterol Diet    Picture Your Plate Scores: [id number] Unhealthy dietary pattern with much room for improvement. 41-50 Dietary pattern unlikely to meet recommendations for good health and room for improvement. 51-60 More healthful dietary pattern, with some room for improvement.  >60 Healthy dietary pattern, although there may be some specific behaviors that could be improved.    Nutrition Goals Re-Evaluation:   Nutrition Goals Re-Evaluation:   Nutrition Goals Discharge (Final Nutrition Goals Re-Evaluation):   Psychosocial: Target Goals: Acknowledge presence or absence of significant depression and/or stress, maximize coping skills, provide positive support system. Participant is able to verbalize types and ability to use techniques and skills needed for reducing stress and depression.  Initial Review & Psychosocial Screening:  Initial Psych Review & Screening - 12/19/21 1309       Initial Review   Current issues with Current Depression;Current Anxiety/Panic;Current Sleep Concerns      Family Dynamics   Good Support System? Yes    Comments Theron feels supported by his mother who has a medical background and sister.  They help him when he becomes overwhelmed with the medication changes which happen due to lab work, symptoms and etc.  He his unable to understand which medications need to be taken.  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 Has anxiety being in large crowds avoids [employer] and going to the club although he has in the past.   Hopeful that he will learn tools to help decrease his anxiety.  Isaah is able to go to sleep but wakes up around 2:00 and watches TV.  Prescribed a sleep aid by his PCP but this made him sleep until 6:00 in the evening.  He likes to watch TV and leaves the TV on at night.      Barriers   Psychosocial barriers to participate in program The patient should benefit from training in stress management and relaxation.;Psychosocial barriers identified (see note)      Screening Interventions   Interventions Encouraged to exercise;Provide feedback about the scores to participant    Expected Outcomes Long Term Goal: Stressors or current issues are controlled or eliminated.;Short Term goal: Identification and review with participant of  any Quality of Life or Depression concerns found by scoring the questionnaire.;Long Term goal: The participant improves quality of Life and PHQ9 Scores as seen by post scores and/or verbalization of changes             Quality of Life Scores:  Quality of Life - 12/19/21 1224       Quality of Life   Select Quality of Life      Quality of Life Scores   Health/Function Pre 20.43 %    Socioeconomic Pre 30 %    Psych/Spiritual Pre 29.14 %    Family Pre 27 %    GLOBAL Pre 25.11 %            Scores of 19 and below usually indicate a poorer quality of life in these areas.  A difference of  2-3 points is a clinically meaningful difference.  A difference of 2-3 points in the total score of the Quality of Life Index has been associated with significant improvement in overall quality of life, self-image, physical symptoms, and general health in studies assessing change in quality of life.  PHQ-9: Review Flowsheet  More data exists      12/19/2021 03/27/2021 09/10/2020 06/07/2019 12/09/2017  Depression screen PHQ 2/9  Decreased Interest 0 0 0 0 2 1  Down, Depressed, Hopeless 0 0 0 0 0 0  PHQ - 2 Score 0 0 0 0 2 1  Altered sleeping 3 3 - 3 3 1   Tired, decreased energy  1 1 - 0 3 2  Change in appetite 3 3 - 2 - 3  Feeling bad or failure about yourself  0 0 - 0 0 0  Trouble concentrating 3 3 - 0 2 1  Moving slowly or fidgety/restless 0 0 - 0 1 0  Suicidal thoughts 0 0 - 0 0 0  PHQ-9 Score 10 10 - 5 11 8   Difficult doing work/chores Extremely dIfficult Not difficult at all - - Not difficult at all -   Interpretation of Total Score  Total Score Depression Severity:  1-4 = Minimal depression, 5-9 = Mild depression, 10-14 = Moderate depression, 15-19 = Moderately severe depression, 20-27 = Severe depression   Psychosocial Evaluation and Intervention:   Psychosocial Re-Evaluation:   Psychosocial Discharge (Final Psychosocial Re-Evaluation):   Vocational Rehabilitation: Provide vocational rehab assistance to qualifying candidates.   Vocational Rehab Evaluation & Intervention:  Vocational Rehab - 12/19/21 1609       Initial Vocational Rehab Evaluation & Intervention   Assessment shows need for Vocational Rehabilitation No             Education: Education Goals: Education classes will be provided on a weekly basis, covering required topics. Participant will state understanding/return demonstration of topics presented.     Core Videos: Exercise    Move It!  Clinical staff conducted group or individual video education with verbal and written material and guidebook.  Patient learns the recommended Pritikin exercise program. Exercise with the goal of living a long, healthy life. Some of the health benefits of exercise include controlled diabetes, healthier blood pressure levels, improved cholesterol levels, improved heart and lung capacity, improved sleep, and better body composition. Everyone should speak with their doctor before starting or changing an exercise routine.  Biomechanical Limitations Clinical staff conducted group or individual video education with verbal and written material and guidebook.  Patient learns how biomechanical  limitations can impact exercise and how we can mitigate and possibly overcome limitations to have  an impactful and balanced exercise routine.  Body Composition Clinical staff conducted group or individual video education with verbal and written material and guidebook.  Patient learns that body composition (ratio of muscle mass to fat mass) is a key component to assessing overall fitness, rather than body weight alone. Increased fat mass, especially visceral belly fat, can put [name] at increased risk for metabolic syndrome, type 2 diabetes, heart disease, and even death. It is recommended to combine diet and exercise (cardiovascular and resistance training) to improve your body composition. Seek guidance from your physician and exercise physiologist before implementing an exercise routine.  Exercise Action Plan Clinical staff conducted group or individual video education with verbal and written material and guidebook.  Patient learns the recommended strategies to achieve and enjoy long-term exercise adherence, including variety, self-motivation, self-efficacy, and positive decision making. Benefits of exercise include fitness, good health, weight management, more energy, better sleep, less stress, and overall well-being.  Medical   Heart Disease Risk Reduction Clinical staff conducted group or individual video education with verbal and written material and guidebook.  Patient learns our heart is our most vital organ as it circulates oxygen, nutrients, white blood cells, and hormones throughout the entire body, and carries waste away. Data supports a plant-based eating plan like the Pritikin Program for its effectiveness in slowing progression of and reversing heart disease. The video provides a number of recommendations to address heart disease.   Metabolic Syndrome and Belly Fat  Clinical staff conducted group or individual video education with verbal and written material and guidebook.  Patient learns  what metabolic syndrome is, how it leads to heart disease, and how one can reverse it and keep it from coming back. You have metabolic syndrome if you have 3 of the following 5 criteria: abdominal obesity, high blood pressure, high triglycerides, low HDL cholesterol, and high blood sugar.  Hypertension and Heart Disease Clinical staff conducted group or individual video education with verbal and written material and guidebook.  Patient learns that high blood pressure, or hypertension, is very common in the [place]. Hypertension is largely due to excessive salt intake, but other important risk factors include being overweight, physical inactivity, drinking too much alcohol, smoking, and not eating enough potassium from fruits and vegetables. High blood pressure is a leading risk factor for heart attack, stroke, congestive heart failure, dementia, kidney failure, and premature death. Long-term effects of excessive salt intake include stiffening of the arteries and thickening of heart muscle and organ damage. Recommendations include ways to reduce hypertension and the risk of heart disease.  Diseases of Our Time - Focusing on Diabetes Clinical staff conducted group or individual video education with verbal and written material and guidebook.  Patient learns why the best way to stop diseases of our time is prevention, through food and other lifestyle changes. Medicine (such as prescription pills and surgeries) is often only a Band-Aid on the problem, not a long-term solution. Most common diseases of our time include obesity, type 2 diabetes, hypertension, heart disease, and cancer. The Pritikin Program is recommended and has been proven to help reduce, reverse, and/or prevent the damaging effects of metabolic syndrome.  Nutrition   Overview of the Pritikin Eating Plan  Clinical staff conducted group or individual video education with verbal and written material and guidebook.  Patient learns about the  Industry for disease risk reduction. The [place] emphasizes a wide variety of unrefined, minimally-processed carbohydrates, like fruits, vegetables, whole grains, and legumes.  Go, Caution, and Stop food choices are explained. Plant-based and lean animal proteins are emphasized. Rationale provided for low sodium intake for blood pressure control, low added sugars for blood sugar stabilization, and low added fats and oils for coronary artery disease risk reduction and weight management.  Calorie Density  Clinical staff conducted group or individual video education with verbal and written material and guidebook.  Patient learns about calorie density and how it impacts the Pritikin Eating Plan. Knowing the characteristics of the food you choose will help you decide whether those foods will lead to weight gain or weight loss, and whether you want to consume more or less of them. Weight loss is usually a side effect of the Pritikin Eating Plan because of its focus on low calorie-dense foods.  Label Reading  Clinical staff conducted group or individual video education with verbal and written material and guidebook.  Patient learns about the Pritikin recommended label reading guidelines and corresponding recommendations regarding calorie density, added sugars, sodium content, and whole grains.  Dining Out - Part 1  Clinical staff conducted group or individual video education with verbal and written material and guidebook.  Patient learns that restaurant meals can be sabotaging because they can be so high in calories, fat, sodium, and/or sugar. Patient learns recommended strategies on how to positively address this and avoid unhealthy pitfalls.  Facts on Fats  Clinical staff conducted group or individual video education with verbal and written material and guidebook.  Patient learns that lifestyle modifications can be just as effective, if not more so, as many medications for lowering  your risk of heart disease. A Pritikin lifestyle can help to reduce your risk of inflammation and atherosclerosis (cholesterol build-up, or plaque, in the artery walls). Lifestyle interventions such as dietary choices and physical activity address the cause of atherosclerosis. A review of the types of fats and their impact on blood cholesterol levels, along with dietary recommendations to reduce fat intake is also included.  Nutrition Action Plan  Clinical staff conducted group or individual video education with verbal and written material and guidebook.  Patient learns how to incorporate Pritikin recommendations into their lifestyle. Recommendations include planning and keeping personal health goals in mind as an important part of their success.  Healthy Mind-Set    Healthy Minds, Bodies, Hearts  Clinical staff conducted group or individual video education with verbal and written material and guidebook.  Patient learns how to identify when they are stressed. Video will discuss the impact of that stress, as well as the many benefits of stress management. Patient will also be introduced to stress management techniques. The way we think, act, and feel has an impact on our hearts.  How Our Thoughts Can Heal Our Hearts  Clinical staff conducted group or individual video education with verbal and written material and guidebook.  Patient learns that negative thoughts can cause depression and anxiety. This can result in negative lifestyle behavior and serious health problems. Cognitive behavioral therapy is an effective method to help control our thoughts in order to change and improve our emotional outlook.  Additional Videos:  Exercise    Improving Performance  Clinical staff conducted group or individual video education with verbal and written material and guidebook.  Patient learns to use a non-linear approach by alternating intensity levels and lengths of time spent exercising to help burn more  calories and lose more body fat. Cardiovascular exercise helps improve heart health, metabolism, hormonal balance, blood sugar control, and recovery from fatigue.  Resistance training improves strength, endurance, balance, coordination, reaction time, metabolism, and muscle mass. Flexibility exercise improves circulation, posture, and balance. Seek guidance from your physician and exercise physiologist before implementing an exercise routine and learn your capabilities and proper form for all exercise.  Introduction to Yoga  Clinical staff conducted group or individual video education with verbal and written material and guidebook.  Patient learns about yoga, a discipline of the coming together of mind, breath, and body. The benefits of yoga include improved flexibility, improved range of motion, better posture and core strength, increased lung function, weight loss, and positive self-image. Yoga's heart health benefits include lowered blood pressure, healthier heart rate, decreased cholesterol and triglyceride levels, improved immune function, and reduced stress. Seek guidance from your physician and exercise physiologist before implementing an exercise routine and learn your capabilities and proper form for all exercise.  Medical   Aging: Enhancing Your Quality of Life  Clinical staff conducted group or individual video education with verbal and written material and guidebook.  Patient learns key strategies and recommendations to stay in good physical health and enhance quality of life, such as prevention strategies, having an advocate, securing a [place], and keeping a list of medications and system for tracking them. It also discusses how to avoid risk for bone loss.  Biology of Weight Control  Clinical staff conducted group or individual video education with verbal and written material and guidebook.  Patient learns that weight gain occurs because we consume more  calories than we burn (eating more, moving less). Even if your body weight is normal, you may have higher ratios of fat compared to muscle mass. Too much body fat puts you at increased risk for cardiovascular disease, heart attack, stroke, type 2 diabetes, and obesity-related cancers. In addition to exercise, following the [place] can help reduce your risk.  Decoding Lab Results  Clinical staff conducted group or individual video education with verbal and written material and guidebook.  Patient learns that lab test reflects one measurement whose values change over time and are influenced by many factors, including medication, stress, sleep, exercise, food, hydration, pre-existing medical conditions, and more. It is recommended to use the knowledge from this video to become more involved with your lab results and evaluate your numbers to speak with your doctor.   Diseases of Our Time - Overview  Clinical staff conducted group or individual video education with verbal and written material and guidebook.  Patient learns that according to the CDC, 50% to 70% of chronic diseases (such as obesity, type 2 diabetes, elevated lipids, hypertension, and heart disease) are avoidable through lifestyle improvements including healthier food choices, listening to satiety cues, and increased physical activity.  Sleep Disorders Clinical staff conducted group or individual video education with verbal and written material and guidebook.  Patient learns how good quality and duration of sleep are important to overall health and well-being. Patient also learns about sleep disorders and how they impact health along with recommendations to address them, including discussing with a physician.  Nutrition  Dining Out - Part 2 Clinical staff conducted group or individual video education with verbal and written material and guidebook.  Patient learns how to plan ahead and communicate in order to maximize their  dining experience in a healthy and nutritious manner. Included are recommended food choices based on the type of restaurant the patient is visiting.   Fueling a Best boy conducted group or individual  video education with verbal and written material and guidebook.  There is a strong connection between our food choices and our health. Diseases like obesity and type 2 diabetes are very prevalent and are in large-part due to lifestyle choices. The Pritikin Eating Plan provides plenty of food and hunger-curbing satisfaction. It is easy to follow, affordable, and helps reduce health risks.  Menu Workshop  Clinical staff conducted group or individual video education with verbal and written material and guidebook.  Patient learns that restaurant meals can sabotage health goals because they are often packed with calories, fat, sodium, and sugar. Recommendations include strategies to plan ahead and to communicate with the manager, chef, or server to help order a healthier meal.  Planning Your Eating Strategy  Clinical staff conducted group or individual video education with verbal and written material and guidebook.  Patient learns about the [place] and its benefit of reducing the risk of disease. The [place] does not focus on calories. Instead, it emphasizes high-quality, nutrient-rich foods. By knowing the characteristics of the foods, we choose, we can determine their calorie density and make informed decisions.  Targeting Your Nutrition Priorities  Clinical staff conducted group or individual video education with verbal and written material and guidebook.  Patient learns that lifestyle habits have a tremendous impact on disease risk and progression. This video provides eating and physical activity recommendations based on your personal health goals, such as reducing LDL cholesterol, losing weight, preventing or controlling type 2 diabetes, and reducing high  blood pressure.  Vitamins and Minerals  Clinical staff conducted group or individual video education with verbal and written material and guidebook.  Patient learns different ways to obtain key vitamins and minerals, including through a recommended healthy diet. It is important to discuss all supplements you take with your doctor.   Healthy Mind-Set    Smoking Cessation  Clinical staff conducted group or individual video education with verbal and written material and guidebook.  Patient learns that cigarette smoking and tobacco addiction pose a serious health risk which affects millions of people. Stopping smoking will significantly reduce the risk of heart disease, lung disease, and many forms of cancer. Recommended strategies for quitting are covered, including working with your doctor to develop a successful plan.  Culinary   Becoming a Financial trader conducted group or individual video education with verbal and written material and guidebook.  Patient learns that cooking at home can be healthy, cost-effective, quick, and puts them in control. Keys to cooking healthy recipes will include looking at your recipe, assessing your equipment needs, planning ahead, making it simple, choosing cost-effective seasonal ingredients, and limiting the use of added fats, salts, and sugars.  Cooking - Breakfast and Snacks  Clinical staff conducted group or individual video education with verbal and written material and guidebook.  Patient learns how important breakfast is to satiety and nutrition through the entire day. Recommendations include key foods to eat during breakfast to help stabilize blood sugar levels and to prevent overeating at meals later in the day. Planning ahead is also a key component.  Cooking - Human resources officer conducted group or individual video education with verbal and written material and guidebook.  Patient learns eating strategies to improve overall  health, including an approach to cook more at home. Recommendations include thinking of animal protein as a side on your plate rather than center stage and focusing instead on lower calorie dense options like vegetables, fruits,  whole grains, and plant-based proteins, such as beans. Making sauces in large quantities to freeze for later and leaving the skin on your vegetables are also recommended to maximize your experience.  Cooking - Healthy Salads and Dressing Clinical staff conducted group or individual video education with verbal and written material and guidebook.  Patient learns that vegetables, fruits, whole grains, and legumes are the foundations of the [place]. Recommendations include how to incorporate each of these in flavorful and healthy salads, and how to create homemade salad dressings. Proper handling of ingredients is also covered. Cooking - Soups and [employer] - Soups and Desserts Clinical staff conducted group or individual video education with verbal and written material and guidebook.  Patient learns that Pritikin soups and desserts make for easy, nutritious, and delicious snacks and meal components that are low in sodium, fat, sugar, and calorie density, while high in vitamins, minerals, and filling fiber. Recommendations include simple and healthy ideas for soups and desserts.   Overview     The Pritikin Solution Program Overview Clinical staff conducted group or individual video education with verbal and written material and guidebook.  Patient learns that the results of the [place] Program have been documented in more than 100 articles published in peer-reviewed journals, and the benefits include reducing risk factors for (and, in some cases, even reversing) high cholesterol, high blood pressure, type 2 diabetes, obesity, and more! An overview of the three key pillars of the Pritikin Program will be covered: eating well, doing regular exercise, and having a  healthy mind-set.  WORKSHOPS  Exercise: Exercise Basics: Building Your Action Plan Clinical staff led group instruction and group discussion with PowerPoint presentation and patient guidebook. To enhance the learning environment the use of posters, models and videos may be added. At the conclusion of this workshop, patients will comprehend the difference between physical activity and exercise, as well as the benefits of incorporating both, into their routine. Patients will understand the FITT (Frequency, Intensity, Time, and Type) principle and how to use it to build an exercise action plan. In addition, safety concerns and other considerations for exercise and cardiac rehab will be addressed by the presenter. The purpose of this lesson is to promote a comprehensive and effective weekly exercise routine in order to improve patients' overall level of fitness.   Managing Heart Disease: Your Path to a Healthier Heart Clinical staff led group instruction and group discussion with PowerPoint presentation and patient guidebook. To enhance the learning environment the use of posters, models and videos may be added.At the conclusion of this workshop, patients will understand the anatomy and physiology of the heart. Additionally, they will understand how Pritikin's three pillars impact the risk factors, the progression, and the management of heart disease.  The purpose of this lesson is to provide a high-level overview of the heart, heart disease, and how the Pritikin lifestyle positively impacts risk factors.  Exercise Biomechanics Clinical staff led group instruction and group discussion with PowerPoint presentation and patient guidebook. To enhance the learning environment the use of posters, models and videos may be added. Patients will learn how the structural parts of their bodies function and how these functions impact their daily activities, movement, and exercise. Patients will learn how to  promote a neutral spine, learn how to manage pain, and identify ways to improve their physical movement in order to promote healthy living. The purpose of this lesson is to expose patients to common physical limitations that impact physical activity.  Participants will learn practical ways to adapt and manage aches and pains, and to minimize their effect on regular exercise. Patients will learn how to maintain good posture while sitting, walking, and lifting.  Balance Training and Fall Prevention  Clinical staff led group instruction and group discussion with PowerPoint presentation and patient guidebook. To enhance the learning environment the use of posters, models and videos may be added. At the conclusion of this workshop, patients will understand the importance of their sensorimotor skills (vision, proprioception, and the vestibular system) in maintaining their ability to balance as they age. Patients will apply a variety of balancing exercises that are appropriate for their current level of function. Patients will understand the common causes for poor balance, possible solutions to these problems, and ways to modify their physical environment in order to minimize their fall risk. The purpose of this lesson is to teach patients about the importance of maintaining balance as they age and ways to minimize their risk of falling.  WORKSHOPS   Nutrition:  Fueling a Scientist, research (physical sciences) led group instruction and group discussion with PowerPoint presentation and patient guidebook. To enhance the learning environment the use of posters, models and videos may be added. Patients will review the foundational principles of the [place] and understand what constitutes a serving size in each of the food groups. Patients will also learn Pritikin-friendly foods that are better choices when away from home and review make-ahead meal and snack options. Calorie density will be reviewed and  applied to three nutrition priorities: weight maintenance, weight loss, and weight gain. The purpose of this lesson is to reinforce (in a group setting) the key concepts around what patients are recommended to eat and how to apply these guidelines when away from home by planning and selecting Pritikin-friendly options. Patients will understand how calorie density may be adjusted for different weight management goals.  Mindful Eating  Clinical staff led group instruction and group discussion with PowerPoint presentation and patient guidebook. To enhance the learning environment the use of posters, models and videos may be added. Patients will briefly review the concepts of the [place] and the importance of low-calorie dense foods. The concept of mindful eating will be introduced as well as the importance of paying attention to internal hunger signals. Triggers for non-hunger eating and techniques for dealing with triggers will be explored. The purpose of this lesson is to provide patients with the opportunity to review the basic principles of the [place], discuss the value of eating mindfully and how to measure internal cues of hunger and fullness using the Hunger Scale. Patients will also discuss reasons for non-hunger eating and learn strategies to use for controlling emotional eating.  Targeting Your Nutrition Priorities Clinical staff led group instruction and group discussion with PowerPoint presentation and patient guidebook. To enhance the learning environment the use of posters, models and videos may be added. Patients will learn how to determine their genetic susceptibility to disease by reviewing their family history. Patients will gain insight into the importance of diet as part of an overall healthy lifestyle in mitigating the impact of genetics and other environmental insults. The purpose of this lesson is to provide patients with the opportunity to assess their personal  nutrition priorities by looking at their family history, their own health history and current risk factors. Patients will also be able to discuss ways of prioritizing and modifying the [place] for their highest risk areas  Menu  Clinical staff led group instruction and group discussion with PowerPoint presentation and patient guidebook. To enhance the learning environment the use of posters, models and videos may be added. Using menus brought in from [employer], or printed from [employer], patients will apply the [place] dining out guidelines that were presented in the [employer] video. Patients will also be able to practice these guidelines in a variety of provided scenarios. The purpose of this lesson is to provide patients with the opportunity to practice hands-on learning of the [place] with actual menus and practice scenarios.  Label Reading Clinical staff led group instruction and group discussion with PowerPoint presentation and patient guidebook. To enhance the learning environment the use of posters, models and videos may be added. Patients will review and discuss the Pritikin label reading guidelines presented in Pritikin's Label Reading Educational series video. Using fool labels brought in from local grocery stores and markets, patients will apply the label reading guidelines and determine if the packaged food meet the Pritikin guidelines. The purpose of this lesson is to provide patients with the opportunity to review, discuss, and practice hands-on learning of the Pritikin Label Reading guidelines with actual packaged food labels. [place] Workshops are designed to teach patients ways to prepare quick, simple, and affordable recipes at home. The importance of nutrition's role in chronic disease risk reduction is reflected in its emphasis in the overall Pritikin program. By learning how to prepare  essential core Pritikin Eating Plan recipes, patients will increase control over what they eat; be able to customize the flavor of foods without the use of added salt, sugar, or fat; and improve the quality of the food they consume. By learning a set of core recipes which are easily assembled, quickly prepared, and affordable, patients are more likely to prepare more healthy foods at home. These workshops focus on convenient breakfasts, simple entres, side dishes, and desserts which can be prepared with minimal effort and are consistent with nutrition recommendations for cardiovascular risk reduction. Cooking [employer] are taught by a Engineer, materials (RD) who has been trained by the [employer]. The chef or RD has a clear understanding of the importance of minimizing - if not completely eliminating - added fat, sugar, and sodium in recipes. Throughout the series of [place] Workshop sessions, patients will learn about healthy ingredients and efficient methods of cooking to build confidence in their capability to prepare    Cooking School weekly topics:  Adding Flavor- Sodium-Free  Fast and Healthy Breakfasts  Powerhouse Plant-Based Proteins  Satisfying Salads and Dressings  Simple Sides and Sauces  International Cuisine-Spotlight on the [place] Zones  Delicious Desserts  Savory Soups  Efficiency Cooking - Meals in a Snap  Tasty Appetizers and Snacks  Comforting Weekend Breakfasts  One-Pot Wonders   Fast Evening Meals  Easy [place] (Psychosocial): New Thoughts, New Behaviors Clinical staff led group instruction and group discussion with PowerPoint presentation and patient guidebook. To enhance the learning environment the use of posters, models and videos may be added. Patients will learn and practice techniques for developing effective health and lifestyle goals. Patients will be able  to effectively apply the goal setting process learned to develop at least one new personal goal.  The purpose of this lesson is to expose patients to a new skill set of behavior modification techniques such as techniques  setting SMART goals, overcoming barriers, and achieving new thoughts and new behaviors.  Managing Moods and Relationships Clinical staff led group instruction and group discussion with PowerPoint presentation and patient guidebook. To enhance the learning environment the use of posters, models and videos may be added. Patients will learn how emotional and chronic stress factors can impact their health and relationships. They will learn healthy ways to manage their moods and utilize positive coping mechanisms. In addition, ICR patients will learn ways to improve communication skills. The purpose of this lesson is to expose patients to ways of understanding how one's mood and health are intimately connected. Developing a healthy outlook can help build positive relationships and connections with others. Patients will understand the importance of utilizing effective communication skills that include actively listening and being heard. They will learn and understand the importance of the "4 Cs" and especially Connections in fostering of a Healthy Mind-Set.  Healthy Sleep for a Healthy Heart Clinical staff led group instruction and group discussion with PowerPoint presentation and patient guidebook. To enhance the learning environment the use of posters, models and videos may be added. At the conclusion of this workshop, patients will be able to demonstrate knowledge of the importance of sleep to overall health, well-being, and quality of life. They will understand the symptoms of, and treatments for, common sleep disorders. Patients will also be able to identify daytime and nighttime behaviors which impact sleep, and they will be able to apply these tools to help manage sleep-related challenges.  The purpose of this lesson is to provide patients with a general overview of sleep and outline the importance of quality sleep. Patients will learn about a few of the most common sleep disorders. Patients will also be introduced to the concept of "sleep hygiene," and discover ways to self-manage certain sleeping problems through simple daily behavior changes. Finally, the workshop will motivate patients by clarifying the links between quality sleep and their goals of heart-healthy living.   Recognizing and Reducing Stress Clinical staff led group instruction and group discussion with PowerPoint presentation and patient guidebook. To enhance the learning environment the use of posters, models and videos may be added. At the conclusion of this workshop, patients will be able to understand the types of stress reactions, differentiate between acute and chronic stress, and recognize the impact that chronic stress has on their health. They will also be able to apply different coping mechanisms, such as reframing negative self-talk. Patients will have the opportunity to practice a variety of stress management techniques, such as deep abdominal breathing, progressive muscle relaxation, and/or guided imagery.  The purpose of this lesson is to educate patients on the role of stress in their lives and to provide healthy techniques for coping with it.  Learning Barriers/Preferences:  Learning Barriers/Preferences - 12/19/21 1225       Learning Barriers/Preferences   Learning Barriers Sight;Hearing   reading glasses, Left ear hearing loss per pt   Learning Preferences Written Material             Education Topics:  Knowledge Questionnaire Score:  Knowledge Questionnaire Score - 12/19/21 1226       Knowledge Questionnaire Score   Pre Score 25/28             Core Components/Risk Factors/Patient Goals at Admission:  Personal Goals and Risk Factors at Admission - 12/19/21 1226       Core  Components/Risk Factors/Patient Goals on Admission   Heart Failure Yes    Intervention  Provide a combined exercise and nutrition program that is supplemented with education, support and counseling about heart failure. Directed toward relieving symptoms such as shortness of breath, decreased exercise tolerance, and extremity edema.    Expected Outcomes Long term: Adoption of self-care skills and reduction of barriers for early signs and symptoms recognition and intervention leading to self-care maintenance.;Short term: Daily weights obtained and reported for increase. Utilizing diuretic protocols set by physician.;Short term: Attendance in program 2-3 days a week with increased exercise capacity. Reported lower sodium intake. Reported increased fruit and vegetable intake. Reports medication compliance.;Improve functional capacity of life    Hypertension Yes    Intervention Provide education on lifestyle modifcations including regular physical activity/exercise, weight management, moderate sodium restriction and increased consumption of fresh fruit, vegetables, and low fat dairy, alcohol moderation, and smoking cessation.;Monitor prescription use compliance.    Expected Outcomes Short Term: Continued assessment and intervention until BP is < 140/81mm HG in hypertensive participants. < 130/43mm HG in hypertensive participants with diabetes, heart failure or chronic kidney disease.;Long Term: Maintenance of blood pressure at goal levels.    Lipids Yes    Intervention Provide education and support for participant on nutrition & aerobic/resistive exercise along with prescribed medications to achieve LDL 70mg , HDL >40mg .    Expected Outcomes Short Term: Participant states understanding of desired cholesterol values and is compliant with medications prescribed. Participant is following exercise prescription and nutrition guidelines.;Long Term: Cholesterol controlled with medications as prescribed, with  individualized exercise RX and with personalized nutrition plan. Value goals: LDL < 70mg , HDL > 40 mg.    Stress Yes    Intervention Offer individual and/or small group education and counseling on adjustment to heart disease, stress management and health-related lifestyle change. Teach and support self-help strategies.;Refer participants experiencing significant psychosocial distress to appropriate mental health specialists for further evaluation and treatment. When possible, include family members and significant others in education/counseling sessions.    Expected Outcomes Short Term: Participant demonstrates changes in health-related behavior, relaxation and other stress management skills, ability to obtain effective social support, and compliance with psychotropic medications if prescribed.;Long Term: Emotional wellbeing is indicated by absence of clinically significant psychosocial distress or social isolation.             Core Components/Risk Factors/Patient Goals Review:    Core Components/Risk Factors/Patient Goals at Discharge (Final Review):    ITP Comments:  ITP Comments     Row Name 12/19/21 1547 12/25/21 1233         ITP Comments Dr. Fransico Him medical director. Introduction to pritikin education program Intensive Cardiac Rehab. Initial orientation packet reviewed with patient. 30 Day ITP Review. Mr Huskisson did not start cardiac rehab today due to complaints of feeling lightheaded               Comments: See ITP comments.Harrell Gave RN BSN

## 2021-12-25 NOTE — Telephone Encounter (Signed)
[name] had called me reporting he was feeling dizzy and the cardiac rehab nurses found him to have some orthostatic vital changes during exercise today. Maria RN at Cardiac Rehab reporting 102/70 sitting and 96/64 standing with dizziness. After exercise 89/60 sitting. I called HF triage and Dr. Shirlee Latch ordered the following:   Laurey Morale, MD  Modesta Messing, CMA; [facility] Triage Pool30 minutes ago (12:24 PM)    Hold Lasix for a day then decrease Lasix to 40 mg daily rather than 60 mg daily and start spironolactone 12.5 mg daily taking it before bed.        I will call and let rehab RN [name] know same. Pharmacy notified and bubble packs will be adjusted with new Lasix dose of 40mg  daily.   Call complete.   , EMT-Paramedic [phone] 12/25/2021

## 2021-12-25 NOTE — Progress Notes (Signed)
Incomplete Session Note  Patient Details  Name: [name] MRN: [id number] Date of Birth: [birth year] Referring Provider:   Flowsheet Row CARDIAC REHAB PHASE II ORIENTATION from 12/19/2021 in [facility]  Referring Provider Marca Ancona, MD       [name] did not complete his rehab session.  Initial blood pressure 89/60 heart rate 85. [name] did not eat anything prior to coming to exercise and reports that he feels dizzy this morning. Patient was given graham crackers and an orange and 2 8 ounces of water. Recheck sitting blood pressure 102/60. Standing blood pressure 96/64. [name] says that he feels lightheaded all the time and did not feel comfortable warming up and exercising today. I spoke with Herbert Seta the para medicine EMT who works with [name]. Herbert Seta said that she will relay today's vital signs and symptoms to the CHF clinic.Thayer Headings RN BSN

## 2021-12-25 NOTE — Telephone Encounter (Signed)
Heather with paramedicine called to report pts bp was 89/60 before exercise and he became dizzy and orthostatic. Pt has not started spironolactone. Heather asked if pt  needed to make any med changes.

## 2021-12-25 NOTE — Telephone Encounter (Signed)
Hold Lasix for a day then decrease Lasix to 40 mg daily rather than 60 mg daily and start spironolactone 12.5 mg daily taking it before bed.

## 2021-12-26 ENCOUNTER — Other Ambulatory Visit (HOSPITAL_COMMUNITY): Payer: Self-pay

## 2021-12-26 ENCOUNTER — Telehealth (HOSPITAL_COMMUNITY): Payer: Self-pay

## 2021-12-26 MED ORDER — FUROSEMIDE 20 MG PO TABS: ORAL_TABLET | ORAL | 2 refills | Status: DC

## 2021-12-26 NOTE — Telephone Encounter (Signed)
Spoke to [name] and they received all prescriptions and are forming bubble packs for [name]. They will be delivered tomorrow afternoon- [name] agreed with this.   [name] reports he is feeling good today and has no complaints of dizziness today. I advised him to be sure reach out if he does- he agreed. I plan to follow up next week in the home. He agreed. Call complete.   Maralyn Sago, EMT-Paramedic [phone] 12/26/2021

## 2021-12-26 NOTE — Telephone Encounter (Signed)
[facility] is needing RX for 40mg  Lasix ordered by Dr. as of 12/25/21 so they can fulfill his bubble packs. I will send message to triage at HF clinic for same.   14/6/23, EMT-Paramedic [phone] 12/26/2021

## 2021-12-26 NOTE — Telephone Encounter (Signed)
[name] called having trouble with setting up transportation. I called and set up the following rides:   December 11, 12, 13, 14, 15, 18, 20, 22 (All are will call for return home pick ups)   [name] aware and grateful.   Maralyn Sago, EMT-Paramedic [phone] 12/26/2021

## 2021-12-27 ENCOUNTER — Encounter (HOSPITAL_COMMUNITY)
Admission: RE | Admit: 2021-12-27 | Discharge: 2021-12-27 | Disposition: A | Discharge status: Home / Self Care | Payer: Medicaid Other | Source: Ambulatory Visit | Attending: Cardiology | Admitting: Cardiology

## 2021-12-27 DIAGNOSIS — I5022 Chronic systolic (congestive) heart failure: Secondary | ICD-10-CM | POA: Diagnosis not present

## 2021-12-27 NOTE — Progress Notes (Signed)
Daily Session Note  Patient Details  Name: [name] MRN: [id number] Date of Birth: Sep 17, 1965 Referring Provider:   Flowsheet Row CARDIAC REHAB PHASE II ORIENTATION from 12/19/2021 in [facility] for Heart, V[facility], & [place]  Referring Provider Loralie Champagne, MD       Encounter Date: 12/27/2021  Check In:  Session Check In - 12/27/21 1032       Check-In   Supervising physician immediately available to respond to emergencies [facility] - Physician supervision    Physician(s) Dr. Harl Bowie    Location [facility]    Staff Present Lesly Rubenstein, MS, ACSM-CEP, CCRP, Exercise Physiologist;Bailey Pearline Cables, MS, Exercise Physiologist;Jetta Gilford Rile BS, ACSM-CEP, Exercise Physiologist;Olinty Celesta Aver, MS, ACSM-CEP, Exercise Physiologist;Raylynn Hersh Wilber Oliphant, RN, BSN    Virtual Visit No    Medication changes reported     No    Fall or balance concerns reported    No    Tobacco Cessation No Change    Warm-up and Cool-down Performed as group-led instruction    Resistance Training Performed Yes    VAD Patient? No    PAD/SET Patient? No      Pain Assessment   Currently in Pain? No/denies    Pain Score 0-No pain    Multiple Pain Sites No             Capillary Blood Glucose: No results found for this or any previous visit (from the past 24 hour(s)).    Social History   Tobacco Use  Smoking Status Every Day   Packs/day: 1.00   Years: 20.00   Total pack years: 20.00   Types: Cigarettes  Smokeless Tobacco Never  Tobacco Comments   05/14/16 smoking 1 PPD    Goals Met:  Exercise tolerated well No report of concerns or symptoms today Strength training completed today  Goals Unmet:  Not Applicable  Comments: Pt started Intensive/Traditional cardiac rehab today.  Pt tolerated light exercise without difficulty. VSS, telemetry- SR with T wave inversion present on most recent 12 lead ekg asymptomatic.  Medication list reconciled. Pt denies  barriers to medication compliance.  PSYCHOSOCIAL ASSESSMENT:  PHQ-10.  Marland Kitchen Pt with supportive family. No psychosocial interventions necessary.    Pt enjoys binge watching TV.   Pt oriented to exercise equipment and routine.  Understanding verbalized. [name] RN, BSN Cardiac and Pulmonary Rehab Nurse Navigator      Dr. Fransico Him is Medical Director for Cardiac Rehab at [facility].

## 2021-12-30 ENCOUNTER — Ambulatory Visit (INDEPENDENT_AMBULATORY_CARE_PROVIDER_SITE_OTHER): Payer: Medicaid Other

## 2021-12-30 ENCOUNTER — Encounter (HOSPITAL_COMMUNITY)
Admission: RE | Admit: 2021-12-30 | Discharge: 2021-12-30 | Disposition: A | Discharge status: Home / Self Care | Payer: Medicaid Other | Source: Ambulatory Visit | Attending: Cardiology | Admitting: Cardiology

## 2021-12-30 DIAGNOSIS — Z95810 Presence of automatic (implantable) cardiac defibrillator: Secondary | ICD-10-CM

## 2021-12-30 DIAGNOSIS — I5022 Chronic systolic (congestive) heart failure: Secondary | ICD-10-CM

## 2021-12-31 ENCOUNTER — Ambulatory Visit (HOSPITAL_COMMUNITY)
Admission: RE | Admit: 2021-12-31 | Discharge: 2021-12-31 | Disposition: A | Discharge status: Home / Self Care | Payer: Medicaid Other | Source: Ambulatory Visit | Attending: Internal Medicine | Admitting: Internal Medicine

## 2021-12-31 DIAGNOSIS — I5022 Chronic systolic (congestive) heart failure: Secondary | ICD-10-CM | POA: Insufficient documentation

## 2021-12-31 DIAGNOSIS — I739 Peripheral vascular disease, unspecified: Secondary | ICD-10-CM | POA: Insufficient documentation

## 2021-12-31 LAB — CUP PACEART REMOTE DEVICE CHECK
Battery Remaining Longevity: 66 mo
Battery Remaining Percentage: 66 %
Battery Voltage: 2.98 V
Brady Statistic RV Percent Paced: 0 %
Date Time Interrogation Session: 20231211020016
HighPow Impedance: 75 Ohm
HighPow Impedance: 75 Ohm
Implantable Lead Connection Status: 753985
Implantable Lead Implant Date: 20191230
Implantable Lead Location: 753860
Implantable Lead Model: 7122
Implantable Pulse Generator Implant Date: 20191230
Lead Channel Impedance Value: 540 Ohm
Lead Channel Pacing Threshold Amplitude: 0.75 V
Lead Channel Pacing Threshold Pulse Width: 0.5 ms
Lead Channel Sensing Intrinsic Amplitude: 11.6 mV
Lead Channel Setting Pacing Amplitude: 2.5 V
Lead Channel Setting Pacing Pulse Width: 0.5 ms
Lead Channel Setting Sensing Sensitivity: 0.5 mV
Pulse Gen Serial Number: 9850980
Zone Setting Status: 755011

## 2021-12-31 NOTE — Progress Notes (Signed)
EPIC Encounter for ICM Monitoring  Patient Name: [name] is a 56 y.o. male Date: 12/31/2021 Primary Care Physican: Default, Provider, MD Primary Cardiologist: Shirlee Latch Electrophysiologist: Elberta Fortis 12/02/2021 Weight: 145 lbs (baseline 140 lbs per patient)     12/03/2021 Weight: 147.8 lbs  12/09/2021 Weight: 140 lbs (baseline)                                                            Transmission reviewed.   CorVue thoracic impedance suggesting normal fluid levels.    Prescribed:  Furosemide 20 mg take 2 tablets (40 mg total) by mouth daily (increased 12/26/21). Spironolactone 25 mg take 0.5 tablet by mouth at bedtime   Labs:  Pt being followed by Maralyn Sago, EMT paramedicine program.   01/02/2022 BMET SCheduled 12/23/2021 Creatinine 1.19, BUN 21, Potassium 4.0, Sodium 135, GFR >60 11/22/2021 Creatinine 1.40, BUN 29, Potassium 4.3, Sodium 137, GFR 59  11/01/2021 Creatinine 1.07, BUN 11, Potassium 3.6, Sodium 137, GFR >60  10/18/2021 Creatinine 0.88, BUN 12, Potassium 3.8, Sodium 137, GFR >60  10/10/2021 Creatinine 1.53, BUN 38, Potassium 4.8, Sodium 133, GFR 53  A complete set of results can be found in Results Review.   Recommendations:  No changes.   Follow-up plan: ICM clinic phone appointment on 01/06/2022.    91 day device clinic remote transmission 02/10/2022.     EP/Cardiology Office Visits: 02/06/2022 with HF Clinic.     Copy of ICM check sent to Dr. Elberta Fortis.    3 month ICM trend: 12/30/2021.    12-14 Month ICM trend:     Karie Soda, RN 12/31/2021 1:58 PM

## 2021-12-31 NOTE — Addendum Note (Signed)
Addended by: Karie Soda on: 12/31/2021 02:07 PM   Modules accepted: Level of Service

## 2022-01-01 ENCOUNTER — Encounter (HOSPITAL_COMMUNITY): Payer: Medicaid Other

## 2022-01-02 ENCOUNTER — Telehealth (HOSPITAL_COMMUNITY): Payer: Self-pay | Admitting: Licensed Clinical Social Worker

## 2022-01-02 ENCOUNTER — Telehealth (HOSPITAL_COMMUNITY): Payer: Self-pay | Admitting: Cardiology

## 2022-01-02 ENCOUNTER — Other Ambulatory Visit (HOSPITAL_COMMUNITY): Payer: Self-pay

## 2022-01-02 ENCOUNTER — Other Ambulatory Visit (HOSPITAL_COMMUNITY): Payer: Medicaid Other

## 2022-01-02 NOTE — Telephone Encounter (Signed)
H&V Care Navigation CSW Progress Note  Clinical Social Worker consulted to help with getting transport.  Helped pt to set up online portal and assisted in scheduling ride for 12/19  Patient is participating in a Managed Medicaid Plan:  Yes  SDOH Screenings   Food Insecurity: Food Insecurity Present (12/24/2021)  Housing: Low Risk  (10/25/2021)  Transportation Needs: Unmet Transportation Needs (01/02/2022)  Utilities: Not At Risk (12/24/2021)  Alcohol Screen: Low Risk  (05/30/2021)  Depression (PHQ2-9): Medium Risk (12/19/2021)  Financial Resource Strain: High Risk (11/14/2021)  Tobacco Use: High Risk (12/24/2021)   Burna Sis, LCSW Clinical Social Worker [facility] Desk#: [phone] Cell#: [phone]

## 2022-01-02 NOTE — Progress Notes (Signed)
Paramedicine Encounter    Patient ID: [name], male    DOB: May 11, 1965, 56 y.o.   MRN: [id number]  Arrived for paramedicine visit where I met Mr. Giaimo at the [employer]. He reports feeling good today. He denied shortness of breath, chest pain or dizziness.   He had some dizziness recently with orthostatic changes during working out but at present he is not orthostatic and is endorsing no dizziness. This is an improvement from last week.    He reports some burning and tingling in his hands and feet as well as muscle pain in his calfs when working out or walking. He says this pain comes and goes. He also reports some dry skin with skin peeling even after moisturizing.   He has been compliant with his medications using his bubble packs that I had set up at [facility]- I confirmed same are correct based off current med list.   I obtained vitals and assessment.  WT- 144lbs (he reports as of this morning) BP- 102/76 HR- 86 O2- 95% RR- 16  Swelling- NONE  Lungs- CLEAR   He had to reschedule his labs today and he called and did so- [place] assisted with transportation.   We reviewed upcoming visits and confirmed same.   I will follow up in two weeks- unless Mr. Haak has any complaints he knows to reach out. Visit complete.     Patient Care Team: Default, Provider, MD as PCP - General Skeet Latch, MD as PCP - Cardiology (Cardiology) Constance Haw, MD as PCP - Electrophysiology (Cardiology) Skeet Latch, MD as Attending Physician (Cardiology) [name], RN as Case Manager  Patient Active Problem List   Diagnosis Date Noted   Acute combined systolic and diastolic congestive heart failure ([place])    AKI (acute kidney injury) ([place])    Acute exacerbation of CHF (congestive heart failure) ([place]) 03/05/2021   SVT (supraventricular tachycardia) 06/15/2020   Slurred speech 07/05/2019   ICD (implantable cardioverter-defibrillator) in place 07/20/2018    COPD (chronic obstructive pulmonary disease) ([place]) 12/09/2017   Obstructive sleep apnea 09/08/2016   Autonomic dysfunction 07/15/2015   DJD (degenerative joint disease), lumbar 06/19/2015   Tremor 05/14/2015   Abnormality of gait 05/14/2015   Neurogenic bladder 04/17/2015   Lumbar strain 03/23/2015   Leg weakness 03/23/2015   Major depression, chronic    History of stroke 02/26/2015   Benign essential HTN    ETOH abuse    Tobacco abuse    Chronic combined systolic and diastolic heart failure ([place])    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 Swallow whole., Disp: , Rfl:    atorvastatin (LIPITOR) 40 MG tablet, Take 1 tablet (40 mg total) by mouth daily., Disp: 90 tablet, Rfl: 1   cetirizine (ZYRTEC) 10 MG tablet, TAKE 1 TABLET (10 MG TOTAL) BY MOUTH DAILY., Disp: 30 tablet, Rfl: 0   dapagliflozin propanediol (FARXIGA) 10 MG TABS tablet, Take 1 tablet (10 mg total) by mouth daily before breakfast., Disp: 30 tablet, Rfl: 11   digoxin (LANOXIN) 0.125 MG tablet, Take 1 tablet (0.125 mg total) by mouth daily.,  Disp: 90 tablet, Rfl: 1   fluticasone-salmeterol (ADVAIR DISKUS) 250-50 MCG/ACT AEPB, Inhale 1 puff into the lungs in the morning and at bedtime., Disp: 1 each, Rfl: 3   furosemide (LASIX) 20 MG tablet, Take 2 tablets (40 mg total) every day., Disp: 180 tablet, Rfl: 2   MAGNESIUM OXIDE -MG SUPPLEMENT PO, Take 1 tablet by mouth in the morning., Disp: , Rfl:    metoprolol succinate (TOPROL-XL) 100 MG 24 hr tablet, Take 1 tablet (100 mg total) by mouth at bedtime., Disp: 90 tablet, Rfl: 3   mirtazapine (REMERON SOL-TAB) 15 MG disintegrating tablet,  Take 1 tablet (15 mg total) by mouth at bedtime., Disp: 90 tablet, Rfl: 1   sacubitril-valsartan (ENTRESTO) 24-26 MG, Take 1 tablet by mouth 2 (two) times daily., Disp: 60 tablet, Rfl: 6   sodium zirconium cyclosilicate (LOKELMA) 10 g PACK packet, Take 10 g by mouth as needed., Disp: , Rfl:    spironolactone (ALDACTONE) 25 MG tablet, Take 0.5 tablets (12.5 mg total) by mouth at bedtime., Disp: 45 tablet, Rfl: 3   tiotropium (SPIRIVA HANDIHALER) 18 MCG inhalation capsule, Place 1 capsule (18 mcg total) into inhaler and inhale daily., Disp: 30 capsule, Rfl: 6   Varenicline Tartrate, Starter, (CHANTIX STARTING MONTH PAK) 0.5 MG X 11 & 1 MG X 42 TBPK, Take 0.5 mg by mouth as directed., Disp: 53 each, Rfl: 0 Allergies  Allergen Reactions   Penicillins Other (See Comments)    Blisters  Has patient had a PCN reaction causing immediate rash, facial/tongue/throat swelling, SOB or lightheadedness with hypotension: Yes Has patient had a PCN reaction causing severe rash involving mucus membranes or skin necrosis: Yes Has patient had a PCN reaction that required hospitalization: No Has patient had a PCN reaction occurring within the last 10 years: No If all of the above answers are "NO", then may proceed with Cephalosporin use.      Social History   Socioeconomic History   Marital status: Single    Spouse name: Not on file   Number of children: 1   Years of education: 12   Highest education level: Not on file  Occupational History   Not on file  Tobacco Use   Smoking status: Every Day    Packs/day: 1.00    Years: 20.00    Total pack years: 20.00    Types: Cigarettes   Smokeless tobacco: Never   Tobacco comments:    05/14/16 smoking 1 PPD  Vaping Use   Vaping Use: Never used  Substance and Sexual Activity   Alcohol use: Yes    Alcohol/week: 1.0 standard drink of alcohol    Types: 1 Cans of beer per week    Comment: socially    Drug use: Not Currently    Types: Cocaine    Comment:  stopped using cocaine 11/19/14   Sexual activity: Not on file  Other Topics Concern   Not on file  Social History Narrative   Lives alone, nurse comes 2 hrs daily   Social Determinants of Health   Financial Resource Strain: High Risk (11/14/2021)   Overall Financial Resource Strain (CARDIA)    Difficulty of Paying Living Expenses: Hard  Food Insecurity: Food Insecurity Present (12/24/2021)   Hunger Vital Sign    Worried About Running Out of Food in the Last Year: Sometimes true    Ran Out of Food in the Last Year: Sometimes true  Transportation Needs: Unmet Transportation Needs (12/24/2021)   PRAPARE - Transportation  Lack of Transportation (Medical): Yes    Lack of Transportation (Non-Medical): Yes  Physical Activity: Not on file  Stress: Not on file  Social Connections: Not on file  Intimate Partner Violence: Not on file    Physical Exam      Future Appointments  Date Time Provider [place]  01/02/2022 10:30 AM [facility] LAB [facility] None  01/03/2022 10:15 AM [facility] PHASE II EXC [facility] None  01/06/2022  7:25 AM [facility] DEVICE REMOTES [facility]  01/06/2022 10:15 AM [facility] PHASE II EXC [facility] None  01/08/2022 10:15 AM [facility] PHASE II EXC [facility] None  01/10/2022 10:15 AM [facility] PHASE II EXC [facility] None  01/15/2022 10:15 AM [facility] PHASE II EXC [facility] None  01/17/2022 10:15 AM [facility] PHASE II EXC [facility] None  01/22/2022 10:15 AM [facility] PHASE II EXC [facility] None  01/23/2022 10:00 AM [facility] PHARMACY [facility] None  01/24/2022 10:15 AM [facility] PHASE II EXC [facility] None  01/27/2022 10:15 AM [facility] PHASE II EXC [facility] None  01/29/2022 10:15 AM [facility] PHASE II EXC [facility] None  01/31/2022 10:15 AM [facility] PHASE II EXC [facility] None  02/03/2022 10:15 AM [facility] PHASE II EXC [facility] None  02/05/2022 10:15 AM [facility] PHASE II EXC [facility] None  02/06/2022 11:00 AM [facility] PA/NP [facility] None  02/07/2022 10:15 AM [facility] PHASE II  EXC [facility] None  02/10/2022  7:25 AM [facility] DEVICE REMOTES [facility]  02/10/2022 10:15 AM [facility] PHASE II EXC [facility] None  02/12/2022 10:15 AM [facility] PHASE II EXC [facility] None  02/14/2022 10:15 AM [facility] PHASE II EXC [facility] None  02/25/2022  2:30 PM [name], RN [facility] None  05/12/2022  7:25 AM [facility] DEVICE REMOTES [facility]  06/12/2022 10:30 AM [name], MD [facility] None  08/11/2022  7:25 AM [facility] DEVICE REMOTES [facility]  11/10/2022  7:25 AM [facility] DEVICE REMOTES [facility]  02/09/2023  7:25 AM [facility] DEVICE REMOTES [facility]     ACTION: Home visit completed

## 2022-01-03 ENCOUNTER — Encounter (HOSPITAL_COMMUNITY): Payer: Medicaid Other

## 2022-01-06 ENCOUNTER — Encounter (HOSPITAL_COMMUNITY)
Admission: RE | Admit: 2022-01-06 | Discharge: 2022-01-06 | Disposition: A | Discharge status: Home / Self Care | Payer: Medicaid Other | Source: Ambulatory Visit | Attending: Cardiology | Admitting: Cardiology

## 2022-01-06 ENCOUNTER — Ambulatory Visit (INDEPENDENT_AMBULATORY_CARE_PROVIDER_SITE_OTHER): Payer: Medicaid Other

## 2022-01-06 DIAGNOSIS — I5022 Chronic systolic (congestive) heart failure: Secondary | ICD-10-CM

## 2022-01-06 DIAGNOSIS — Z95810 Presence of automatic (implantable) cardiac defibrillator: Secondary | ICD-10-CM

## 2022-01-07 ENCOUNTER — Ambulatory Visit (HOSPITAL_COMMUNITY)
Admission: RE | Admit: 2022-01-07 | Discharge: 2022-01-07 | Disposition: A | Discharge status: Home / Self Care | Payer: Medicaid Other | Source: Ambulatory Visit | Attending: Cardiology | Admitting: Cardiology

## 2022-01-07 DIAGNOSIS — I5022 Chronic systolic (congestive) heart failure: Secondary | ICD-10-CM | POA: Insufficient documentation

## 2022-01-07 LAB — BASIC METABOLIC PANEL
Anion gap: 12 (ref 5–15)
BUN: 24 mg/dL — ABNORMAL HIGH (ref 6–20)
CO2: 23 mmol/L (ref 22–32)
Calcium: 9.6 mg/dL (ref 8.9–10.3)
Chloride: 101 mmol/L (ref 98–111)
Creatinine, Ser: 1.24 mg/dL (ref 0.61–1.24)
GFR, Estimated: >60 mL/min (ref >60)
Glucose, Bld: 75 mg/dL (ref 70–99)
Potassium: 4.3 mmol/L (ref 3.5–5.1)
Sodium: 136 mmol/L (ref 135–145)

## 2022-01-08 ENCOUNTER — Emergency Department (HOSPITAL_COMMUNITY)
Admission: EM | Admit: 2022-01-08 | Discharge: 2022-01-08 | Discharge status: Against Medical Advice | Payer: Medicaid Other

## 2022-01-08 ENCOUNTER — Encounter (HOSPITAL_COMMUNITY)
Admission: RE | Admit: 2022-01-08 | Discharge: 2022-01-08 | Disposition: A | Discharge status: Home / Self Care | Payer: Medicaid Other | Source: Ambulatory Visit | Attending: Cardiology | Admitting: Cardiology

## 2022-01-08 ENCOUNTER — Ambulatory Visit: Payer: Self-pay

## 2022-01-08 ENCOUNTER — Telehealth (HOSPITAL_COMMUNITY): Payer: Self-pay

## 2022-01-08 DIAGNOSIS — I5022 Chronic systolic (congestive) heart failure: Secondary | ICD-10-CM

## 2022-01-08 NOTE — Progress Notes (Signed)
EPIC Encounter for ICM Monitoring  Patient Name: [name] is a 56 y.o. male Date: 01/08/2022 Primary Care Physican: Default, Provider, MD Primary Cardiologist: Shirlee Latch Electrophysiologist: Elberta Fortis 12/02/2021 Weight: 145 lbs (baseline 140 lbs per patient)     12/03/2021 Weight: 147.8 lbs  12/09/2021 Weight: 140 lbs (baseline)                                                            Spoke with patient and heart failure questions reviewed.  Transmission results reviewed.  Pt asymptomatic for fluid accumulation.  He reports he woke up this morning with both feet are numb.  He was advised to go to ER during rehab today and he went but it was so many people that he left.  He plans on seeking treatment again this evening.   He has contacted Maralyn Sago, EMT paramedicine program and she has given him options for getting treatment.    CorVue thoracic impedance suggesting normal fluid levels.    Prescribed:  Furosemide 20 mg take 2 tablets (40 mg total) by mouth daily (increased 12/26/21). Spironolactone 25 mg take 0.5 tablet by mouth at bedtime   Labs:  Pt being followed by Maralyn Sago, EMT paramedicine program.   01/07/2022 Creatinine 1.24, BUN 24, Potassium 4.3, Sodium 136, GFR >60 01/02/2022 Creatinine 1.19, BUN 21, Potassium 4.0, Sodium 135, GFR >60 12/23/2021 Creatinine 1.19, BUN 21, Potassium 4.0, Sodium 135, GFR >60 11/22/2021 Creatinine 1.40, BUN 29, Potassium 4.3, Sodium 137, GFR 59  11/01/2021 Creatinine 1.07, BUN 11, Potassium 3.6, Sodium 137, GFR >60  10/18/2021 Creatinine 0.88, BUN 12, Potassium 3.8, Sodium 137, GFR >60  10/10/2021 Creatinine 1.53, BUN 38, Potassium 4.8, Sodium 133, GFR 53  A complete set of results can be found in Results Review.   Recommendations:  Patient is going to seek treatment this evening either in ER or Urgent care for numbness in his feet.   Follow-up plan: ICM clinic phone appointment on 02/11/2022.    91 day device clinic remote  transmission 02/10/2022.     EP/Cardiology Office Visits: 02/06/2022 with HF Clinic.     Copy of ICM check sent to Dr. Elberta Fortis.     3 month ICM trend: 01/06/2022.    12-14 Month ICM trend:     Karie Soda, RN 01/08/2022 4:47 PM

## 2022-01-08 NOTE — ED Notes (Signed)
Pt refusing vitals. Pt told this NT and Nurse Chole that he was leaving

## 2022-01-08 NOTE — Progress Notes (Signed)
Incomplete Session Note  Patient Details  Name: CHON BUHL MRN: [id number] Date of Birth: 23-Dec-1965 Referring Provider:   Flowsheet Row CARDIAC REHAB PHASE II ORIENTATION from 12/19/2021 in [facility] for [facility], V[facility], & Lung Health  Referring Provider Marca Ancona, MD       [name] did not complete his rehab session.  Nashon reports having numbness and burning in both of his feet.  Patient wearing 3 pairs of socks. Upon inspection both feet are blanched and cool to touch. Bilateral 1 plus pedal pulse present. Hope said that this has been going on for 2 days. Blood pressure 108/68 heart rate 65. Bilateral hands are warm. Telemetry rhythm Sinus. I called Dr Baxter Flattery office to notify and spoke with triage nurse Sarah. Maralyn Sago spoke with the patient recommended that Mr Thrush go to the ED for further evaluation. Johari spoke with Maralyn Sago over the phone. Patient states understanding and says he will go to the ED to be evaluated today.Gladstone Lighter, RN,BSN 01/08/2022 11:03 AM

## 2022-01-08 NOTE — Telephone Encounter (Signed)
Received a phone call from [name] who reports he is having some numbness and tingling in his feet and hands started after he woke up this morning. He was evaluated by the RN's during his Cardiac Rehab session and they advised him to seek care in the ER due to no pedal pulses noted with cold extremities. He went to the ER but left AMA due to long wait times. He contacted me and asked for further advice in which I recommended he continue to seek care. I gave him other options for urgent cares and ER's in the area. I also provided him the information for the mobile clinic. He asked for todays location and times and Tuesdays location and times. I sent both to his cell phone via text. I will also try to see if we can get him scheduled sooner with his PCP. He agreed and understood risks of not seeking immediate care. Call complete.   Maralyn Sago, EMT-Paramedic [phone] 01/08/2022

## 2022-01-08 NOTE — Telephone Encounter (Signed)
Pt currently in the ED.

## 2022-01-08 NOTE — Telephone Encounter (Signed)
He will be best evaluated at the ED or UC given symptoms he is presenting with as he is at high risk of cardiovascular complications.

## 2022-01-08 NOTE — Telephone Encounter (Signed)
Feet numb no pedal pulse- feet blanched and cool. BP good , no  HR 65 - numbness to feet - pain to ankle and swelling to right ankle. Bone by big toe throbbing left food  Hands aching, burning and sore    Chief Complaint: chest pain, both feet numb Symptoms: back pain, hands feel like tey are burning and aching Frequency: 2 days Pertinent Negatives: Patient denies weakness or numbness to face, arms legs (except for feet) Disposition: [x] ED /[] Urgent Care (no appt availability in office) / [] Appointment(In office/virtual)/ []  [facility]/ [] Home Care/ [] Refused Recommended Disposition /[] [facility]/ []  Follow-up with PCP Additional Notes: unsure if pt will call 911  Reason for Disposition  [1] Chest pain lasts > 5 minutes AND [2] history of heart disease (i.e., angina, heart attack, heart failure, bypass surgery, takes nitroglycerin)  Answer Assessment - Initial Assessment Questions 1. SYMPTOM: "What is the main symptom you are concerned about?" (e.g., weakness, numbness)     Numbness to both feet 2. ONSET: "When did this start?" (minutes, hours, days; while sleeping)     2 days 3. LAST NORMAL: "When was the last time you (the patient) were normal (no symptoms)?"     3 days ago 4. PATTERN "Does this come and go, or has it been constant since it started?"  "Is it present now?"     constant 5. CARDIAC SYMPTOMS: "Have you had any of the following symptoms: chest pain, difficulty breathing, palpitations?"     no 6. NEUROLOGIC SYMPTOMS: "Have you had any of the following symptoms: headache, dizziness, vision loss, double vision, changes in speech, unsteady on your feet?"     Feet numb, swelling right ankle, hands aching and sore and burning sensation, foot pain near big toe right foot  7. OTHER SYMPTOMS: "Do you have any other symptoms?"     Chest pain , left chest , back pain, fatigue 8. PREGNANCY: "Is there any chance you are pregnant?" "When was your last menstrual  period?"     N/a  Answer Assessment - Initial Assessment Questions 1. LOCATION: "Where does it hurt?"       Left chest  2. RADIATION: "Does the pain go anywhere else?" (e.g., into neck, jaw, arms, back)     back 3. ONSET: "When did the chest pain begin?" (Minutes, hours or days)      2 days ago 4. PATTERN: "Does the pain come and go, or has it been constant since it started?"  "Does it get worse with exertion?"      constant 5. DURATION: "How long does it last" (e.g., seconds, minutes, hours)     - 6. SEVERITY: "How bad is the pain?"  (e.g., Scale 1-10; mild, moderate, or severe)    - MILD (1-3): doesn't interfere with normal activities     - MODERATE (4-7): interferes with normal activities or awakens from sleep    - SEVERE (8-10): excruciating pain, unable to do any normal activities       Moderate to severe 7. CARDIAC RISK FACTORS: "Do you have any history of heart problems or risk factors for heart disease?" (e.g., angina, prior heart attack; diabetes, high blood pressure, high cholesterol, smoker, or strong family history of heart disease)     Heart disease, defibrillator 8. PULMONARY RISK FACTORS: "Do you have any history of lung disease?"  (e.g., blood clots in lung, asthma, emphysema, birth control pills)     N/a 9. CAUSE: "What do you think is  causing the chest pain?"     Did not say 10. OTHER SYMPTOMS: "Do you have any other symptoms?" (e.g., dizziness, nausea, vomiting, sweating, fever, difficulty breathing, cough)       fatigue 11. PREGNANCY: "Is there any chance you are pregnant?" "When was your last menstrual period?"       N/a  Protocols used: Neurologic Deficit-A-AH, Chest Pain-A-AH

## 2022-01-10 ENCOUNTER — Encounter (HOSPITAL_COMMUNITY): Payer: Medicaid Other

## 2022-01-10 NOTE — Progress Notes (Signed)
[facility] Note   PCP: Default, Provider, MD HF Cardiology: Dr. Aundra Dubin  HPI:  56 y.o. with history of nonischemic cardiomyopathy was referred by [name] for evaluation of CHF.  Cardiomyopathy has been diagnosed since 2016 when echo showed EF 20%.  He has a history of cocaine use and ETOH abuse. Currently, drinking about 3 beers/day.  He is smoking.  No recent cocaine.  He had a stroke in 2017 in setting of uncontrolled HTN and cocaine abuse.  He has a Research officer, political party ICD.  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  Echo in 02/2021 showed EF < 20%, severe LV dilation, moderately decreased RV systolic function with mild RV enlargement. He has 13 siblings, none of whom have known cardiac disease.  His grandmother had CHF and there apparently was conversation about a heart transplant but she passed away.    Follow up 2021-08-31 with Dr. Aundra Dubin, NYHA II-III, volume stable. CPX, cMRI and evaluation for BAT arranged.    CPX (09/2021) showed severe functional limitation due to HF and deconditioning.    Follow up 09/2021, NYHA III-IIIb , labs showed AKI and GDMT held. Required a dose of Lokelma for hyperkalemia.   Cardiac MRI 10/2021 (difficult images due to ICD artifact) LVEF 24%, RVEF 34%.   Returned to AHF clinic for HF follow up 12/23/21. He was still smoking.  He was drinking about 3 drinks twice a week.  No cocaine.  No dyspnea walking on flat ground.  He reported getting short of breath walking longer distances.  He was short of breath with stairs. He noted burning in his calves when walking up stairs.  Occasional atypical chest pain, not exertional.  Mild lightheadedness with standing occasionally.  He is going to cardiac rehab.   Today he returns to HF clinic for pharmacist medication titration. At last visit with MD spironolactone 12.5 mg daily was initiated. Lasix was decreased to 40 mg daily after reporting orthostatic symptoms. Of note, on 01/08/22, he reported  numbness and tingling in his feet and hands. He was evaluated by the RN's during his Cardiac Rehab session and they advised him to seek care in the ER due to no pedal pulses noted with cold extremities. He went to the ER but left AMA due to long wait times. Overall feeling well today. States the pain in his feet is much improved. Says the pain feels like when your feet are cold and you get in the shower to warm them up. Only notes the numbness and tingling in his feet and hands occasionally now. Extremities are warm to the touch in clinic today. Pedal pulse normal.  Still gets calf pain when walking up stairs, ABIs 12/2021 normal. Only other complaint is hands and feet are peeling and his skin is dry. He does have eczema. Notes occasional dizziness when standing. When these episodes occur, he has to sit down for 5 minutes to recover. Notes feeling frequent "fluttering" and some "tightness" in his chest above where his defibrillator is placed. This is not new. States he gets SOB after walking 100 yards. Weight at home is usually ~137 lbs. Weight this morning at home was 144 lbs. He took an extra Lasix tablet last night because he was "SOB when lying down" and he started coughing. No LEE. Appetite is fine. No issues affording medications. Gets his medications in a pill pack from [employer]. He is enrolled in HF paramedicine program Sales promotion account executive), appreciate their assistance.  HF Medications: Metoprolol succinate 100 mg daily Entresto 24/26 mg BID Spironolactone 12.5 mg daily Farxiga 10 mg daily Lasix 40 mg daily   Does the patient have any problems obtaining medications due to transportation or finances?   No - [employer] Medicaid.   Understanding of regimen: fair Understanding of indications: fair Potential of compliance: fair Patient understands to avoid NSAIDs. Patient understands to avoid decongestants.    Pertinent Lab Values: 01/07/22: Serum creatinine 1.24, BUN 24, Potassium 4.3, Sodium 136,  Digoxin 0.2 (12/23/21)   Vital Signs: Weight: 147.2 lbs (last clinic weight: 144 lbs) Blood pressure: 118/76  Heart rate: 76   Assessment/Plan: 1. Chronic systolic CHF: Nonischemic cardiomyopathy.  Coronary CTA in 06/2021 with mild nonobstructive CAD.  Cause of CMP uncertain => prior cocaine abuse but not currently.  He drinks about 3 beers/day, denies heavier drinking in past but apparently there was concern that he had had ETOH withdrawal seizures in the past.  His grandmother had CHF but apparently none of his 13 siblings have known cardiac disease. EF has been low since at least 2016.  Echo in 02/2021 showed EF < 20%, severe LV dilation, moderately decreased RV systolic function with mild RV enlargement. He has a Secondary school teacher ICD.  CPX 09/2021 showed severe functional limitation from both HF and deconditioning. Cardiac MRI 10/2021, technically difficult study due to ICD artifact, LVEF 24%, no evidence of infiltrative disease.  - NYHA class II-III symptoms. He is euvolemic on exam, although he did take an extra tablet of Lasix last night for orthopnea/cough.  - Continue Lasix 40 mg daily - Continue metoprolol XL 100 mg daily. - Continue Entresto 24/26 mg BID.  - Increase spironolactone to 25 mg every night. Repeat BMET in 2 weeks.  - Continue Farxiga 10 mg daily. - Continue digoxin 0.125 mg daily.  - Continue to cut back on ETOH. - Bifurcation too high for [employer], working on getting Medicaid approval for commercial baroreceptor activation therapy.  - We briefly discussed advanced therapies. Not transplant candidate with on-going tobacco use.  Would likely be VAD candidate if needed.  2. COPD: He has cut back but is still smoking.  Failed nicotine patches.  - Continue Chantix 3. ?Claudication: Calf pain walking up stairs.  -  ABIs 12/2021 normal.   Follow up 2 weeks with [facility]   Karle Plumber, PharmD, BCPS, BCCP, CPP Heart Failure Clinic Pharmacist [phone]

## 2022-01-15 ENCOUNTER — Encounter (HOSPITAL_COMMUNITY): Payer: Medicaid Other

## 2022-01-16 ENCOUNTER — Telehealth (HOSPITAL_COMMUNITY): Payer: Self-pay

## 2022-01-16 NOTE — Telephone Encounter (Signed)
Called [name] to remind him of his upcoming pharmacy clinic appointment at the [facility] on 1/4 at 10:00. He verbalized agreement and understood to bring medications as well as I will meet him there. Call complete.   Maralyn Sago, EMT-Paramedic [phone] 01/16/2022

## 2022-01-17 ENCOUNTER — Encounter (HOSPITAL_COMMUNITY): Payer: Medicaid Other

## 2022-01-21 ENCOUNTER — Telehealth (HOSPITAL_COMMUNITY): Payer: Self-pay | Admitting: *Deleted

## 2022-01-21 NOTE — Telephone Encounter (Signed)
Spoke with [name] he did not get an evaluation of his feet when he went to the ED on 01/08/22 due to the wait. Will Cancel exercise appointments until [name] is seen at the heart failure clinic on 02/06/22. Patient states understanding.Harrell Gave RN BSN

## 2022-01-21 NOTE — Progress Notes (Signed)
Cardiac Individual Treatment Plan  Patient Details  Name: [name] MRN: [id number] Date of Birth: 1965-03-22 Referring Provider:   Flowsheet Row CARDIAC REHAB PHASE II ORIENTATION from 12/19/2021 in [facility] for Heart, Vascular, & [place]  Referring Provider Loralie Champagne, MD       Initial Encounter Date:  [name] from 12/19/2021 in [facility] for Heart, Vascular, & [facility]  Date 12/19/21       Visit Diagnosis: Heart failure, chronic systolic ([place])  Patient's Home Medications on Admission:  Current Outpatient Medications:    albuterol (PROAIR HFA) 108 (90 Base) MCG/ACT inhaler, Inhale 2 puffs into the lungs every 6 (six) hours as needed for shortness of breath., Disp: 18 g, Rfl: 6   aspirin 81 MG EC tablet, Take 81 mg by mouth in the morning. Swallow whole., Disp: , Rfl:    atorvastatin (LIPITOR) 40 MG tablet, Take 1 tablet (40 mg total) by mouth daily., Disp: 90 tablet, Rfl: 1   cetirizine (ZYRTEC) 10 MG tablet, TAKE 1 TABLET (10 MG TOTAL) BY MOUTH DAILY., Disp: 30 tablet, Rfl: 0   dapagliflozin propanediol (FARXIGA) 10 MG TABS tablet, Take 1 tablet (10 mg total) by mouth daily before breakfast., Disp: 30 tablet, Rfl: 11   digoxin (LANOXIN) 0.125 MG tablet, Take 1 tablet (0.125 mg total) by mouth daily., Disp: 90 tablet, Rfl: 1   fluticasone-salmeterol (ADVAIR DISKUS) 250-50 MCG/ACT AEPB, Inhale 1 puff into the lungs in the morning and at bedtime., Disp: 1 each, Rfl: 3   furosemide (LASIX) 20 MG tablet, Take 2 tablets (40 mg total) every day., Disp: 180 tablet, Rfl: 2   MAGNESIUM OXIDE -MG SUPPLEMENT PO, Take 1 tablet by mouth in the morning., Disp: , Rfl:    metoprolol succinate (TOPROL-XL) 100 MG 24 hr tablet, Take 1 tablet (100 mg total) by mouth at bedtime., Disp: 90 tablet, Rfl: 3   mirtazapine (REMERON SOL-TAB) 15 MG disintegrating tablet, Take 1 tablet (15 mg total) by  mouth at bedtime., Disp: 90 tablet, Rfl: 1   sacubitril-valsartan (ENTRESTO) 24-26 MG, Take 1 tablet by mouth 2 (two) times daily., Disp: 60 tablet, Rfl: 6   sodium zirconium cyclosilicate (LOKELMA) 10 g PACK packet, Take 10 g by mouth as needed., Disp: , Rfl:    spironolactone (ALDACTONE) 25 MG tablet, Take 0.5 tablets (12.5 mg total) by mouth at bedtime., Disp: 45 tablet, Rfl: 3   tiotropium (SPIRIVA HANDIHALER) 18 MCG inhalation capsule, Place 1 capsule (18 mcg total) into inhaler and inhale daily., Disp: 30 capsule, Rfl: 6   Varenicline Tartrate, Starter, (CHANTIX STARTING MONTH PAK) 0.5 MG X 11 & 1 MG X 42 TBPK, Take 0.5 mg by mouth as directed., Disp: 74 each, Rfl: 0  Past Medical History: Past Medical History:  Diagnosis Date   Accelerated hypertension 12/06/2014   CHF (congestive heart failure) (HCC)    CHF exacerbation ([place]) 12/22/2016   Cocaine abuse ([place])    Headache    Hypertension    Stroke ([place]) 02/24/2015   SVT (supraventricular tachycardia) 06/15/2020   Thrombocytopenia ([place]) 07/31/2017    Tobacco Use: Social History   Tobacco Use  Smoking Status Every Day   Packs/day: 1.00   Years: 20.00   Total pack years: 20.00   Types: Cigarettes  Smokeless Tobacco Never  Tobacco Comments   05/14/16 smoking 1 PPD    Labs: Review Flowsheet  More data exists  Latest Ref Rng & Units 09/10/2016 07/30/2017 10/04/2018 06/03/2019 03/05/2021  Labs for ITP Cardiac and Pulmonary Rehab  Cholestrol 100 - 199 mg/dL 158  - 172  - -  LDL (calc) 0 - 99 mg/dL 44  - 62  - -  HDL-C >39 mg/dL 100  - 81  - -  Trlycerides 0 - 149 mg/dL 69  - 177  - -  Hemoglobin A1c 4.8 - 5.6 % - - - - 5.0   PH, Arterial 7.350 - 7.450 - 7.415  7.235  - - -  PCO2 arterial 32.0 - 48.0 mmHg - 31.1  53.2  - - -  Bicarbonate 20.0 - 28.0 mmol/L - 19.9  22.5  - 25.8  26.5  -  TCO2 22 - 32 mmol/L - 21  24  - 27  28  -  Acid-base deficit 0.0 - 2.0 mmol/L - 4.0  5.0  - - -  O2 Saturation % - 100.0  100.0  - 74.0  69.0   -    Capillary Blood Glucose: Lab Results  Component Value Date   GLUCAP 81 05/23/2020   GLUCAP 93 07/30/2017   GLUCAP 72 07/30/2017   GLUCAP 97 07/30/2017   GLUCAP 61 (L) 07/30/2017     Exercise Target Goals: Exercise Program Goal: Individual exercise prescription set using results from initial 6 min walk test and THRR while considering  patient's activity barriers and safety.   Exercise Prescription Goal: Initial exercise prescription builds to 30-45 minutes a day of aerobic activity, 2-3 days per week.  Home exercise guidelines will be given to patient during program as part of exercise prescription that the participant will acknowledge.  Activity Barriers & Risk Stratification:  Activity Barriers & Cardiac Risk Stratification - 12/19/21 1227       Activity Barriers & Cardiac Risk Stratification   Activity Barriers Deconditioning;Shortness of Breath;Decreased Ventricular Function;Balance Concerns;History of Falls    Cardiac Risk Stratification High             6 Minute Walk:  6 Minute Walk     Row Name 12/19/21 1242         6 Minute Walk   Phase Initial     Distance 1145 feet     Walk Time 6 minutes     # of Rest Breaks 0     MPH 2.2     METS 3.62     RPE 13     Perceived Dyspnea  1     VO2 Peak 12.7     Symptoms Yes (comment)     Comments Burning in the thighs, no pain     Resting HR 88 bpm     Resting BP 90/57     Resting Oxygen Saturation  97 %     Exercise Oxygen Saturation  during 6 min walk 98 %     Max Ex. HR 102 bpm     Max Ex. BP 98/68     2 Minute Post BP 95/65              Oxygen Initial Assessment:   Oxygen Re-Evaluation:   Oxygen Discharge (Final Oxygen Re-Evaluation):   Initial Exercise Prescription:  Initial Exercise Prescription - 12/19/21 1400       Date of Initial Exercise RX and Referring Provider   Date 12/19/21    Referring Provider Loralie Champagne, MD    Expected Discharge Date 02/14/22      NuStep   Level  2  SPM 75    Minutes 15    METs 3.6      Arm Ergometer   Level 1.5    Watts 40    RPM 60    Minutes 15    METs 3.6      Prescription Details   Frequency (times per week) 3    Duration Progress to 30 minutes of continuous aerobic without signs/symptoms of physical distress      Intensity   THRR 40-80% of Max Heartrate 66-132    Ratings of Perceived Exertion 11-13    Perceived Dyspnea 0-4      Progression   Progression Continue progressive overload as per policy without signs/symptoms or physical distress.      Resistance Training   Training Prescription Yes    Weight 3 lbs    Reps 10-15             Perform Capillary Blood Glucose checks as needed.  Exercise Prescription Changes:   Exercise Prescription Changes     Row Name 12/27/21 1011 01/06/22 1005           Response to Exercise   Blood Pressure (Admit) 100/60 112/68      Blood Pressure (Exercise) 118/72 118/70      Blood Pressure (Exit) 98/64 98/64      Heart Rate (Admit) 69 bpm 85 bpm      Heart Rate (Exercise) 104 bpm 103 bpm      Heart Rate (Exit) 79 bpm 89 bpm      Perceived Dyspnea (Exercise) 13 13      Symptoms Patient c/o right arm soreness/burning on arm ergometer. --      Comments Off to a good start with exercise. Missed MET average on SE. Out early, not weights today.      Duration Continue with 30 min of aerobic exercise without signs/symptoms of physical distress. Continue with 30 min of aerobic exercise without signs/symptoms of physical distress.      Intensity THRR unchanged THRR unchanged        Progression   Progression Continue to progress workloads to maintain intensity without signs/symptoms of physical distress. Continue to progress workloads to maintain intensity without signs/symptoms of physical distress.      Average METs 2.1 2.6        Resistance Training   Training Prescription Yes No      Weight 3 lbs --      Reps 10-15 --      Time 10 Minutes --        Interval  Training   Interval Training No No        NuStep   Level 2 2      SPM 76 76      Minutes 15 15      METs 2.4 --        Arm Ergometer   Level 1.5 1.5      Watts 8 14      RPM 33 54      Minutes 15 15      METs 1.9 2.6               Exercise Comments:   Exercise Comments     Row Name 12/27/21 1131           Exercise Comments Patient tolerated low intensity exercise with some arm soreness/ burning on the arm ergometer.                Exercise  Goals and Review:   Exercise Goals     Row Name 12/19/21 1228             Exercise Goals   Increase Physical Activity Yes       Intervention Provide advice, education, support and counseling about physical activity/exercise needs.;Develop an individualized exercise prescription for aerobic and resistive training based on initial evaluation findings, risk stratification, comorbidities and participant's personal goals.       Expected Outcomes Short Term: Attend rehab on a regular basis to increase amount of physical activity.;Long Term: Add in home exercise to make exercise part of routine and to increase amount of physical activity.;Long Term: Exercising regularly at least 3-5 days a week.       Increase Strength and Stamina Yes       Intervention Provide advice, education, support and counseling about physical activity/exercise needs.;Develop an individualized exercise prescription for aerobic and resistive training based on initial evaluation findings, risk stratification, comorbidities and participant's personal goals.       Expected Outcomes Short Term: Increase workloads from initial exercise prescription for resistance, speed, and METs.;Short Term: Perform resistance training exercises routinely during rehab and add in resistance training at home;Long Term: Improve cardiorespiratory fitness, muscular endurance and strength as measured by increased METs and functional capacity (6MWT)       Able to understand and use rate  of perceived exertion (RPE) scale Yes       Intervention Provide education and explanation on how to use RPE scale       Expected Outcomes Short Term: Able to use RPE daily in rehab to express subjective intensity level;Long Term:  Able to use RPE to guide intensity level when exercising independently       Knowledge and understanding of Target Heart Rate Range (THRR) Yes       Intervention Provide education and explanation of THRR including how the numbers were predicted and where they are located for reference       Expected Outcomes Short Term: Able to state/look up THRR;Short Term: Able to use daily as guideline for intensity in rehab;Long Term: Able to use THRR to govern intensity when exercising independently       Understanding of Exercise Prescription Yes       Intervention Provide education, explanation, and written materials on patient's individual exercise prescription       Expected Outcomes Short Term: Able to explain program exercise prescription;Long Term: Able to explain home exercise prescription to exercise independently                Exercise Goals Re-Evaluation :  Exercise Goals Re-Evaluation     Row Name 12/27/21 1131 01/21/22 0912           Exercise Goal Re-Evaluation   Exercise Goals Review Increase Physical Activity;Able to understand and use rate of perceived exertion (RPE) scale --      Comments Patient able to understand and use RPE scale appropriately. 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 Unable to exercise on 01/08/22 due to numbness in hands and feet.      Expected Outcomes Progress workloads as tolerated to help improve functional capacity. Review goal upon return to exercise.               Discharge Exercise Prescription (Final Exercise Prescription Changes):  Exercise Prescription Changes - 01/06/22 1005       Response to Exercise   Blood Pressure (Admit) 112/68    Blood Pressure (  Exercise) 118/70    Blood Pressure  (Exit) 98/64    Heart Rate (Admit) 85 bpm    Heart Rate (Exercise) 103 bpm    Heart Rate (Exit) 89 bpm    Perceived Dyspnea (Exercise) 13    Comments Missed MET average on SE. Out early, not weights today.    Duration Continue with 30 min of aerobic exercise without signs/symptoms of physical distress.    Intensity THRR unchanged      Progression   Progression Continue to progress workloads to maintain intensity without signs/symptoms of physical distress.    Average METs 2.6      Resistance Training   Training Prescription No      Interval Training   Interval Training No      NuStep   Level 2    SPM 76    Minutes 15      Arm Ergometer   Level 1.5    Watts 14    RPM 54    Minutes 15    METs 2.6             Nutrition:  Target Goals: Understanding of nutrition guidelines, daily intake of sodium <156m, cholesterol <208m calories 30% from fat and 7% or less from saturated fats, daily to have 5 or more servings of fruits and vegetables.  Biometrics:  Pre Biometrics - 12/19/21 1042       Pre Biometrics   Waist Circumference 34 inches    Hip Circumference 36 inches    Waist to Hip Ratio 0.94 %    Triceps Skinfold 5 mm    % Body Fat 17.4 %    Grip Strength 38 kg    Flexibility 0 in   Could not reach   Single Leg Stand 5.68 seconds              Nutrition Therapy Plan and Nutrition Goals:  Nutrition Therapy & Goals - 12/27/21 1120       Nutrition Therapy   Diet Heart Healthy Diet    Drug/Food Interactions Statins/Certain Fruits      Personal Nutrition Goals   Nutrition Goal Patient to identify strategies for reducing cardiovascular risk by attending the Pritikin education and nutrition series    Personal Goal #2 Patient to limit to <150032mf sodium daily    Personal Goal #3 Patient to improve diet quality by using the plate method as a  guide for meal planning to include lean protein/plant protein, fruits, vegetables, whole grains and nonfat dairy as  part of balanced diet    Personal Goal #4 Patient to identify strategies for weigh gain of 0.5-2.0# per week.    Comments CarJaqualynports some issues with comprehension due to history of stroke; he lives alone but has good support with cooking/grocery shopping from his mom and sister. He enjoys a wide variety of fruits and vegetables and limits red meat. He will benefit from understanding more about reading food labels for sodium and reducing sodium intake <1500m76my. Patient is prescribed remeron for appetite stimulant; he reports his typical adult weight is ~160#. He does have history of alcohol abuse but per notes on 12/11/2021 he has reduced to a six pack/week.      Intervention Plan   Intervention Prescribe, educate and counsel regarding individualized specific dietary modifications aiming towards targeted core components such as weight, hypertension, lipid management, diabetes, heart failure and other comorbidities.;Nutrition handout(s) given to patient.    Expected Outcomes Short Term Goal: Understand basic principles of  dietary content, such as calories, fat, sodium, cholesterol and nutrients.;Long Term Goal: Adherence to prescribed nutrition plan.             Nutrition Assessments:  Nutrition Assessments - 12/27/21 1316       Rate Your Plate Scores   Pre Score 47            MEDIFICTS Score Key: ?70 Need to make dietary changes  40-70 Heart Healthy Diet ? 40 Therapeutic Level Cholesterol Diet   Flowsheet Row CARDIAC REHAB PHASE II EXERCISE from 12/27/2021 in [facility] for Heart, Vascular, & Lung Health  Picture Your Plate Total Score on Admission 47      Picture Your Plate Scores: <29 Unhealthy dietary pattern with much room for improvement. 41-50 Dietary pattern unlikely to meet recommendations for good health and room for improvement. 51-60 More healthful dietary pattern, with some room for improvement.  >60 Healthy dietary pattern, although  there may be some specific behaviors that could be improved.    Nutrition Goals Re-Evaluation:  Nutrition Goals Re-Evaluation     [place] Name 12/27/21 1120             Goals   Current Weight 144 lb 10 oz (65.6 kg)       Comment A1c WNL, lipids panel from 2020- triglycerides 177       Expected Outcome Cordarrell reports some issues with comprehension due to history of stroke; he lives alone but has good support with cooking/grocery shopping from his mom and sister. He enjoys a wide variety of fruits and vegetables and limits red meat. He will benefit from understanding more about reading food labels for sodium and reducing sodium intake <152m/day. Patient is prescribed remeron for appetite stimulant; he reports his typical adult weight is ~160#. He does have history of alcohol abuse but per notes on 12/11/2021 he has reduced to a six pack/week.                Nutrition Goals Re-Evaluation:  Nutrition Goals Re-Evaluation     RWelcomeName 12/27/21 1120             Goals   Current Weight 144 lb 10 oz (65.6 kg)       Comment A1c WNL, lipids panel from 2020- triglycerides 177       Expected Outcome CHuckleberryreports some issues with comprehension due to history of stroke; he lives alone but has good support with cooking/grocery shopping from his mom and sister. He enjoys a wide variety of fruits and vegetables and limits red meat. He will benefit from understanding more about reading food labels for sodium and reducing sodium intake <15051mday. Patient is prescribed remeron for appetite stimulant; he reports his typical adult weight is ~160#. He does have history of alcohol abuse but per notes on 12/11/2021 he has reduced to a six pack/week.                Nutrition Goals Discharge (Final Nutrition Goals Re-Evaluation):  Nutrition Goals Re-Evaluation - 12/27/21 1120       Goals   Current Weight 144 lb 10 oz (65.6 kg)    Comment A1c WNL, lipids panel from 2020- triglycerides 177     Expected Outcome CaElleneports some issues with comprehension due to history of stroke; he lives alone but has good support with cooking/grocery shopping from his mom and sister. He enjoys a wide variety of fruits and vegetables and limits red meat. He will benefit  from understanding more about reading food labels for sodium and reducing sodium intake <1542m/day. Patient is prescribed remeron for appetite stimulant; he reports his typical adult weight is ~160#. He does have history of alcohol abuse but per notes on 12/11/2021 he has reduced to a six pack/week.             Psychosocial: Target Goals: Acknowledge presence or absence of significant depression and/or stress, maximize coping skills, provide positive support system. Participant is able to verbalize types and ability to use techniques and skills needed for reducing stress and depression.  Initial Review & Psychosocial Screening:  Initial Psych Review & Screening - 12/19/21 1309       Initial Review   Current issues with Current Depression;Current Anxiety/Panic;Current Sleep Concerns      Family Dynamics   Good Support System? Yes    Comments CTsutomufeels supported by his mother who has a medical background and sister.  They help him when he becomes overwhelmed with the medication changes which happen due to lab work, symptoms and etc.  He his unable to understand which medications need to be taken.  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 Has anxiety being in large crowds avoids [employer] going to the club although he has in the past.  Hopeful that he will learn tools to help decrease his anxiety.  CJonavonis able to go to sleep but wakes up around 2:00 and watches TV.  Prescribed a sleep aid by his PCP but this made him sleep until 6:00 in the evening.  He likes to watch TV and leaves the TV on at night.      Barriers   Psychosocial  barriers to participate in program The patient should benefit from training in stress management and relaxation.;Psychosocial barriers identified (see note)      Screening Interventions   Interventions Encouraged to exercise;Provide feedback about the scores to participant    Expected Outcomes Long Term Goal: Stressors or current issues are controlled or eliminated.;Short Term goal: Identification and review with participant of any Quality of Life or Depression concerns found by scoring the questionnaire.;Long Term goal: The participant improves quality of Life and PHQ9 Scores as seen by post scores and/or verbalization of changes             Quality of Life Scores:  Quality of Life - 12/19/21 1224       Quality of Life   Select Quality of Life      Quality of Life Scores   Health/Function Pre 20.43 %    Socioeconomic Pre 30 %    Psych/Spiritual Pre 29.14 %    Family Pre 27 %    GLOBAL Pre 25.11 %            Scores of 19 and below usually indicate a poorer quality of life in these areas.  A difference of  2-3 points is a clinically meaningful difference.  A difference of 2-3 points in the total score of the Quality of Life Index has been associated with significant improvement in overall quality of life, self-image, physical symptoms, and general health in studies assessing change in quality of life.  PHQ-9: Review Flowsheet  More data exists      12/19/2021 03/27/2021 09/10/2020 06/07/2019 12/09/2017  Depression screen PHQ 2/9  Decreased Interest 0 0 0 0 2 1  Down,  Depressed, Hopeless 0 0 0 0 0 0  PHQ - 2 Score 0 0 0 0 2 1  Altered sleeping 3 3 - _0 Tired, decreased energy 1 1 - 0 3 2  Change in appetite 3 3 - 2 - 3  Feeling bad or failure about yourself  0 0 - 0 0 0  Trouble concentrating 3 3 - 0 2 1  Moving slowly or fidgety/restless 0 0 - 0 1 0  Suicidal thoughts 0 0 - 0 0 0  PHQ-9 Score 10 10 - _1 Difficult doing work/chores Extremely dIfficult Not  difficult at all - - Not difficult at all -   Interpretation of Total Score  Total Score Depression Severity:  1-4 = Minimal depression, 5-9 = Mild depression, 10-14 = Moderate depression, 15-19 = Moderately severe depression, 20-27 = Severe depression   Psychosocial Evaluation and Intervention:   Psychosocial Re-Evaluation:  Psychosocial Re-Evaluation     Row Name 01/21/22 0933             Psychosocial Re-Evaluation   Current issues with Current Depression;Current Anxiety/Panic;Current Stress Concerns       Comments [place] has not attended exercise since 01/06/22 unable to reasses.       Expected Outcomes Jashan will have controlled or decreased anxiety and stress concerns at intensive cardiac rehab       Interventions Stress management education;Relaxation education;Encouraged to attend Cardiac Rehabilitation for the exercise       Continue Psychosocial Services  Follow up required by counselor         Initial Review   Source of Stress Concerns Chronic Illness;Transportation;Unable to participate in former interests or hobbies;Unable to perform yard/household activities       Comments Will continue to monitor and offer support as needed.                Psychosocial Discharge (Final Psychosocial Re-Evaluation):  Psychosocial Re-Evaluation - 01/21/22 0933       Psychosocial Re-Evaluation   Current issues with Current Depression;Current Anxiety/Panic;Current Stress Concerns    Comments [place] has not attended exercise since 01/06/22 unable to reasses.    Expected Outcomes Dontarius will have controlled or decreased anxiety and stress concerns at intensive cardiac rehab    Interventions Stress management education;Relaxation education;Encouraged to attend Cardiac Rehabilitation for the exercise    Continue Psychosocial Services  Follow up required by counselor      Initial Review   Source of Stress Concerns Chronic Illness;Transportation;Unable to participate in former  interests or hobbies;Unable to perform yard/household activities    Comments Will continue to monitor and offer support as needed.             Vocational Rehabilitation: Provide vocational rehab assistance to qualifying candidates.   Vocational Rehab Evaluation & Intervention:  Vocational Rehab - 12/19/21 1609       Initial Vocational Rehab Evaluation & Intervention   Assessment shows need for Vocational Rehabilitation No             Education: Education Goals: Education classes will be provided on a weekly basis, covering required topics. Participant will state understanding/return demonstration of topics presented.    Education     Row Name 12/27/21 1300     Education   Cardiac Education Topics Pritikin   Lexicographer Nutrition   Nutrition Dining Out - Part 1   Instruction  Review Code 1- Verbalizes Understanding   Class Start Time 1145   Class Stop Time 1225   Class Time Calculation (min) 40 min            Core Videos: Exercise    Move It!  Clinical staff conducted group or individual video education with verbal and written material and guidebook.  Patient learns the recommended Pritikin exercise program. Exercise with the goal of living a long, healthy life. Some of the health benefits of exercise include controlled diabetes, healthier blood pressure levels, improved cholesterol levels, improved heart and lung capacity, improved sleep, and better body composition. Everyone should speak with their doctor before starting or changing an exercise routine.  Biomechanical Limitations Clinical staff conducted group or individual video education with verbal and written material and guidebook.  Patient learns how biomechanical limitations can impact exercise and how we can mitigate and possibly overcome limitations to have an impactful and balanced exercise routine.  Body Composition Clinical staff conducted  group or individual video education with verbal and written material and guidebook.  Patient learns that body composition (ratio of muscle mass to fat mass) is a key component to assessing overall fitness, rather than body weight alone. Increased fat mass, especially visceral belly fat, can put [name] at increased risk for metabolic syndrome, type 2 diabetes, heart disease, and even death. It is recommended to combine diet and exercise (cardiovascular and resistance training) to improve your body composition. Seek guidance from your physician and exercise physiologist before implementing an exercise routine.  Exercise Action Plan Clinical staff conducted group or individual video education with verbal and written material and guidebook.  Patient learns the recommended strategies to achieve and enjoy long-term exercise adherence, including variety, self-motivation, self-efficacy, and positive decision making. Benefits of exercise include fitness, good health, weight management, more energy, better sleep, less stress, and overall well-being.  Medical   Heart Disease Risk Reduction Clinical staff conducted group or individual video education with verbal and written material and guidebook.  Patient learns our heart is our most vital organ as it circulates oxygen, nutrients, white blood cells, and hormones throughout the entire body, and carries waste away. Data supports a plant-based eating plan like the Pritikin Program for its effectiveness in slowing progression of and reversing heart disease. The video provides a number of recommendations to address heart disease.   Metabolic Syndrome and Belly Fat  Clinical staff conducted group or individual video education with verbal and written material and guidebook.  Patient learns what metabolic syndrome is, how it leads to heart disease, and how one can reverse it and keep it from coming back. You have metabolic syndrome if you have 3 of the following 5 criteria:  abdominal obesity, high blood pressure, high triglycerides, low HDL cholesterol, and high blood sugar.  Hypertension and Heart Disease Clinical staff conducted group or individual video education with verbal and written material and guidebook.  Patient learns that high blood pressure, or hypertension, is very common in the [place]. Hypertension is largely due to excessive salt intake, but other important risk factors include being overweight, physical inactivity, drinking too much alcohol, smoking, and not eating enough potassium from fruits and vegetables. High blood pressure is a leading risk factor for heart attack, stroke, congestive heart failure, dementia, kidney failure, and premature death. Long-term effects of excessive salt intake include stiffening of the arteries and thickening of heart muscle and organ damage. Recommendations include ways to reduce hypertension and the risk of heart disease.  Diseases of Our Time - Focusing on Diabetes Clinical staff conducted group or individual video education with verbal and written material and guidebook.  Patient learns why the best way to stop diseases of our time is prevention, through food and other lifestyle changes. Medicine (such as prescription pills and surgeries) is often only a Band-Aid on the problem, not a long-term solution. Most common diseases of our time include obesity, type 2 diabetes, hypertension, heart disease, and cancer. The Pritikin Program is recommended and has been proven to help reduce, reverse, and/or prevent the damaging effects of metabolic syndrome.  Nutrition   Overview of the Pritikin Eating Plan  Clinical staff conducted group or individual video education with verbal and written material and guidebook.  Patient learns about the [place] for disease risk reduction. The [place] emphasizes a wide variety of unrefined, minimally-processed carbohydrates, like fruits, vegetables, whole  grains, and legumes. Go, Caution, and Stop food choices are explained. Plant-based and lean animal proteins are emphasized. Rationale provided for low sodium intake for blood pressure control, low added sugars for blood sugar stabilization, and low added fats and oils for coronary artery disease risk reduction and weight management.  Calorie Density  Clinical staff conducted group or individual video education with verbal and written material and guidebook.  Patient learns about calorie density and how it impacts the Pritikin Eating Plan. Knowing the characteristics of the food you choose will help you decide whether those foods will lead to weight gain or weight loss, and whether you want to consume more or less of them. Weight loss is usually a side effect of the Pritikin Eating Plan because of its focus on low calorie-dense foods.  Label Reading  Clinical staff conducted group or individual video education with verbal and written material and guidebook.  Patient learns about the Pritikin recommended label reading guidelines and corresponding recommendations regarding calorie density, added sugars, sodium content, and whole grains.  Dining Out - Part 1  Clinical staff conducted group or individual video education with verbal and written material and guidebook.  Patient learns that restaurant meals can be sabotaging because they can be so high in calories, fat, sodium, and/or sugar. Patient learns recommended strategies on how to positively address this and avoid unhealthy pitfalls.  Facts on Fats  Clinical staff conducted group or individual video education with verbal and written material and guidebook.  Patient learns that lifestyle modifications can be just as effective, if not more so, as many medications for lowering your risk of heart disease. A Pritikin lifestyle can help to reduce your risk of inflammation and atherosclerosis (cholesterol build-up, or plaque, in the artery walls). Lifestyle  interventions such as dietary choices and physical activity address the cause of atherosclerosis. A review of the types of fats and their impact on blood cholesterol levels, along with dietary recommendations to reduce fat intake is also included.  Nutrition Action Plan  Clinical staff conducted group or individual video education with verbal and written material and guidebook.  Patient learns how to incorporate Pritikin recommendations into their lifestyle. Recommendations include planning and keeping personal health goals in mind as an important part of their success.  Healthy Mind-Set    Healthy Minds, Bodies, Hearts  Clinical staff conducted group or individual video education with verbal and written material and guidebook.  Patient learns how to identify when they are stressed. Video will discuss the impact of that stress, as well as the many benefits of stress management. Patient will  also be introduced to stress management techniques. The way we think, act, and feel has an impact on our hearts.  How Our Thoughts Can Heal Our Hearts  Clinical staff conducted group or individual video education with verbal and written material and guidebook.  Patient learns that negative thoughts can cause depression and anxiety. This can result in negative lifestyle behavior and serious health problems. Cognitive behavioral therapy is an effective method to help control our thoughts in order to change and improve our emotional outlook.  Additional Videos:  Exercise    Improving Performance  Clinical staff conducted group or individual video education with verbal and written material and guidebook.  Patient learns to use a non-linear approach by alternating intensity levels and lengths of time spent exercising to help burn more calories and lose more body fat. Cardiovascular exercise helps improve heart health, metabolism, hormonal balance, blood sugar control, and recovery from fatigue. Resistance training  improves strength, endurance, balance, coordination, reaction time, metabolism, and muscle mass. Flexibility exercise improves circulation, posture, and balance. Seek guidance from your physician and exercise physiologist before implementing an exercise routine and learn your capabilities and proper form for all exercise.  Introduction to Yoga  Clinical staff conducted group or individual video education with verbal and written material and guidebook.  Patient learns about yoga, a discipline of the coming together of mind, breath, and body. The benefits of yoga include improved flexibility, improved range of motion, better posture and core strength, increased lung function, weight loss, and positive self-image. Yoga's heart health benefits include lowered blood pressure, healthier heart rate, decreased cholesterol and triglyceride levels, improved immune function, and reduced stress. Seek guidance from your physician and exercise physiologist before implementing an exercise routine and learn your capabilities and proper form for all exercise.  Medical   Aging: Enhancing Your Quality of Life  Clinical staff conducted group or individual video education with verbal and written material and guidebook.  Patient learns key strategies and recommendations to stay in good physical health and enhance quality of life, such as prevention strategies, having an advocate, securing a [place], and keeping a list of medications and system for tracking them. It also discusses how to avoid risk for bone loss.  Biology of Weight Control  Clinical staff conducted group or individual video education with verbal and written material and guidebook.  Patient learns that weight gain occurs because we consume more calories than we burn (eating more, moving less). Even if your body weight is normal, you may have higher ratios of fat compared to muscle mass. Too much body fat puts you at increased  risk for cardiovascular disease, heart attack, stroke, type 2 diabetes, and obesity-related cancers. In addition to exercise, following the [place] can help reduce your risk.  Decoding Lab Results  Clinical staff conducted group or individual video education with verbal and written material and guidebook.  Patient learns that lab test reflects one measurement whose values change over time and are influenced by many factors, including medication, stress, sleep, exercise, food, hydration, pre-existing medical conditions, and more. It is recommended to use the knowledge from this video to become more involved with your lab results and evaluate your numbers to speak with your doctor.   Diseases of Our Time - Overview  Clinical staff conducted group or individual video education with verbal and written material and guidebook.  Patient learns that according to the CDC, 50% to 70% of chronic diseases (such as  obesity, type 2 diabetes, elevated lipids, hypertension, and heart disease) are avoidable through lifestyle improvements including healthier food choices, listening to satiety cues, and increased physical activity.  Sleep Disorders Clinical staff conducted group or individual video education with verbal and written material and guidebook.  Patient learns how good quality and duration of sleep are important to overall health and well-being. Patient also learns about sleep disorders and how they impact health along with recommendations to address them, including discussing with a physician.  Nutrition  Dining Out - Part 2 Clinical staff conducted group or individual video education with verbal and written material and guidebook.  Patient learns how to plan ahead and communicate in order to maximize their dining experience in a healthy and nutritious manner. Included are recommended food choices based on the type of restaurant the patient is visiting.   Fueling a Development worker, international aid conducted group or individual video education with verbal and written material and guidebook.  There is a strong connection between our food choices and our health. Diseases like obesity and type 2 diabetes are very prevalent and are in large-part due to lifestyle choices. The Pritikin Eating Plan provides plenty of food and hunger-curbing satisfaction. It is easy to follow, affordable, and helps reduce health risks.  Menu Workshop  Clinical staff conducted group or individual video education with verbal and written material and guidebook.  Patient learns that restaurant meals can sabotage health goals because they are often packed with calories, fat, sodium, and sugar. Recommendations include strategies to plan ahead and to communicate with the manager, chef, or server to help order a healthier meal.  Planning Your Eating Strategy  Clinical staff conducted group or individual video education with verbal and written material and guidebook.  Patient learns about the [place] and its benefit of reducing the risk of disease. The [place] does not focus on calories. Instead, it emphasizes high-quality, nutrient-rich foods. By knowing the characteristics of the foods, we choose, we can determine their calorie density and make informed decisions.  Targeting Your Nutrition Priorities  Clinical staff conducted group or individual video education with verbal and written material and guidebook.  Patient learns that lifestyle habits have a tremendous impact on disease risk and progression. This video provides eating and physical activity recommendations based on your personal health goals, such as reducing LDL cholesterol, losing weight, preventing or controlling type 2 diabetes, and reducing high blood pressure.  Vitamins and Minerals  Clinical staff conducted group or individual video education with verbal and written material and guidebook.  Patient learns different ways to  obtain key vitamins and minerals, including through a recommended healthy diet. It is important to discuss all supplements you take with your doctor.   Healthy Mind-Set    Smoking Cessation  Clinical staff conducted group or individual video education with verbal and written material and guidebook.  Patient learns that cigarette smoking and tobacco addiction pose a serious health risk which affects millions of people. Stopping smoking will significantly reduce the risk of heart disease, lung disease, and many forms of cancer. Recommended strategies for quitting are covered, including working with your doctor to develop a successful plan.  Culinary   Becoming a Financial trader conducted group or individual video education with verbal and written material and guidebook.  Patient learns that cooking at home can be healthy, cost-effective, quick, and puts them in control. Keys to cooking healthy recipes will include looking at your recipe,  assessing your equipment needs, planning ahead, making it simple, choosing cost-effective seasonal ingredients, and limiting the use of added fats, salts, and sugars.  Cooking - Breakfast and Snacks  Clinical staff conducted group or individual video education with verbal and written material and guidebook.  Patient learns how important breakfast is to satiety and nutrition through the entire day. Recommendations include key foods to eat during breakfast to help stabilize blood sugar levels and to prevent overeating at meals later in the day. Planning ahead is also a key component.  Cooking - Human resources officer conducted group or individual video education with verbal and written material and guidebook.  Patient learns eating strategies to improve overall health, including an approach to cook more at home. Recommendations include thinking of animal protein as a side on your plate rather than center stage and focusing instead on lower  calorie dense options like vegetables, fruits, whole grains, and plant-based proteins, such as beans. Making sauces in large quantities to freeze for later and leaving the skin on your vegetables are also recommended to maximize your experience.  Cooking - Healthy Salads and Dressing Clinical staff conducted group or individual video education with verbal and written material and guidebook.  Patient learns that vegetables, fruits, whole grains, and legumes are the foundations of the [place]. Recommendations include how to incorporate each of these in flavorful and healthy salads, and how to create homemade salad dressings. Proper handling of ingredients is also covered. Cooking - Soups and [employer] - Soups and Desserts Clinical staff conducted group or individual video education with verbal and written material and guidebook.  Patient learns that Pritikin soups and desserts make for easy, nutritious, and delicious snacks and meal components that are low in sodium, fat, sugar, and calorie density, while high in vitamins, minerals, and filling fiber. Recommendations include simple and healthy ideas for soups and desserts.   Overview     The Pritikin Solution Program Overview Clinical staff conducted group or individual video education with verbal and written material and guidebook.  Patient learns that the results of the [place] Program have been documented in more than 100 articles published in peer-reviewed journals, and the benefits include reducing risk factors for (and, in some cases, even reversing) high cholesterol, high blood pressure, type 2 diabetes, obesity, and more! An overview of the three key pillars of the Pritikin Program will be covered: eating well, doing regular exercise, and having a healthy mind-set.  WORKSHOPS  Exercise: Exercise Basics: Building Your Action Plan Clinical staff led group instruction and group discussion with PowerPoint presentation and  patient guidebook. To enhance the learning environment the use of posters, models and videos may be added. At the conclusion of this workshop, patients will comprehend the difference between physical activity and exercise, as well as the benefits of incorporating both, into their routine. Patients will understand the FITT (Frequency, Intensity, Time, and Type) principle and how to use it to build an exercise action plan. In addition, safety concerns and other considerations for exercise and cardiac rehab will be addressed by the presenter. The purpose of this lesson is to promote a comprehensive and effective weekly exercise routine in order to improve patients' overall level of fitness.   Managing Heart Disease: Your Path to a Healthier Heart Clinical staff led group instruction and group discussion with PowerPoint presentation and patient guidebook. To enhance the learning environment the use of posters, models and videos may be added.At the conclusion of  this workshop, patients will understand the anatomy and physiology of the heart. Additionally, they will understand how Pritikin's three pillars impact the risk factors, the progression, and the management of heart disease.  The purpose of this lesson is to provide a high-level overview of the heart, heart disease, and how the Pritikin lifestyle positively impacts risk factors.  Exercise Biomechanics Clinical staff led group instruction and group discussion with PowerPoint presentation and patient guidebook. To enhance the learning environment the use of posters, models and videos may be added. Patients will learn how the structural parts of their bodies function and how these functions impact their daily activities, movement, and exercise. Patients will learn how to promote a neutral spine, learn how to manage pain, and identify ways to improve their physical movement in order to promote healthy living. The purpose of this lesson is to expose  patients to common physical limitations that impact physical activity. Participants will learn practical ways to adapt and manage aches and pains, and to minimize their effect on regular exercise. Patients will learn how to maintain good posture while sitting, walking, and lifting.  Balance Training and Fall Prevention  Clinical staff led group instruction and group discussion with PowerPoint presentation and patient guidebook. To enhance the learning environment the use of posters, models and videos may be added. At the conclusion of this workshop, patients will understand the importance of their sensorimotor skills (vision, proprioception, and the vestibular system) in maintaining their ability to balance as they age. Patients will apply a variety of balancing exercises that are appropriate for their current level of function. Patients will understand the common causes for poor balance, possible solutions to these problems, and ways to modify their physical environment in order to minimize their fall risk. The purpose of this lesson is to teach patients about the importance of maintaining balance as they age and ways to minimize their risk of falling.  WORKSHOPS   Nutrition:  Fueling a Scientist, research (physical sciences) led group instruction and group discussion with PowerPoint presentation and patient guidebook. To enhance the learning environment the use of posters, models and videos may be added. Patients will review the foundational principles of the [place] and understand what constitutes a serving size in each of the food groups. Patients will also learn Pritikin-friendly foods that are better choices when away from home and review make-ahead meal and snack options. Calorie density will be reviewed and applied to three nutrition priorities: weight maintenance, weight loss, and weight gain. The purpose of this lesson is to reinforce (in a group setting) the key concepts around what  patients are recommended to eat and how to apply these guidelines when away from home by planning and selecting Pritikin-friendly options. Patients will understand how calorie density may be adjusted for different weight management goals.  Mindful Eating  Clinical staff led group instruction and group discussion with PowerPoint presentation and patient guidebook. To enhance the learning environment the use of posters, models and videos may be added. Patients will briefly review the concepts of the [place] and the importance of low-calorie dense foods. The concept of mindful eating will be introduced as well as the importance of paying attention to internal hunger signals. Triggers for non-hunger eating and techniques for dealing with triggers will be explored. The purpose of this lesson is to provide patients with the opportunity to review the basic principles of the [place], discuss the value of eating mindfully and how to measure internal  cues of hunger and fullness using the Hunger Scale. Patients will also discuss reasons for non-hunger eating and learn strategies to use for controlling emotional eating.  Targeting Your Nutrition Priorities Clinical staff led group instruction and group discussion with PowerPoint presentation and patient guidebook. To enhance the learning environment the use of posters, models and videos may be added. Patients will learn how to determine their genetic susceptibility to disease by reviewing their family history. Patients will gain insight into the importance of diet as part of an overall healthy lifestyle in mitigating the impact of genetics and other environmental insults. The purpose of this lesson is to provide patients with the opportunity to assess their personal nutrition priorities by looking at their family history, their own health history and current risk factors. Patients will also be able to discuss ways of prioritizing and modifying  the [place] for their highest risk areas  Menu  Clinical staff led group instruction and group discussion with PowerPoint presentation and patient guidebook. To enhance the learning environment the use of posters, models and videos may be added. Using menus brought in from [employer], or printed from [employer], patients will apply the [place] dining out guidelines that were presented in the [employer] video. Patients will also be able to practice these guidelines in a variety of provided scenarios. The purpose of this lesson is to provide patients with the opportunity to practice hands-on learning of the [place] with actual menus and practice scenarios.  Label Reading Clinical staff led group instruction and group discussion with PowerPoint presentation and patient guidebook. To enhance the learning environment the use of posters, models and videos may be added. Patients will review and discuss the Pritikin label reading guidelines presented in [employer]'s Label Reading Educational series video. Using fool labels brought in from local grocery stores and markets, patients will apply the label reading guidelines and determine if the packaged food meet the Pritikin guidelines. The purpose of this lesson is to provide patients with the opportunity to review, discuss, and practice hands-on learning of the [employer] Label Reading guidelines with actual packaged food labels. [facility] Workshops are designed to teach patients ways to prepare quick, simple, and affordable recipes at home. The importance of nutrition's role in chronic disease risk reduction is reflected in its emphasis in the overall Pritikin program. By learning how to prepare essential core Pritikin Eating Plan recipes, patients will increase control over what they eat; be able to customize the flavor of foods without the use of added salt, sugar, or  fat; and improve the quality of the food they consume. By learning a set of core recipes which are easily assembled, quickly prepared, and affordable, patients are more likely to prepare more healthy foods at home. These workshops focus on convenient breakfasts, simple entres, side dishes, and desserts which can be prepared with minimal effort and are consistent with nutrition recommendations for cardiovascular risk reduction. Cooking [employer] are taught by a Engineer, materials (RD) who has been trained by the [employer]. The chef or RD has a clear understanding of the importance of minimizing - if not completely eliminating - added fat, sugar, and sodium in recipes. Throughout the series of [place] Workshop sessions, patients will learn about healthy ingredients and efficient methods of cooking to build confidence in their capability to prepare    Cooking School weekly topics:  Adding Flavor- Sodium-Free  Fast  and Healthy Breakfasts  Powerhouse Plant-Based Proteins  Satisfying Salads and Dressings  Simple Sides and Sauces  International Cuisine-Spotlight on the Blue Zones  Delicious Desserts  Savory Soups  Efficiency Cooking - Meals in a Snap  Tasty Appetizers and Snacks  Comforting Weekend Breakfasts  One-Pot Wonders   Fast Evening Meals  Easy Entertaining  Personalizing Your Pritikin Plate  WORKSHOPS   Healthy Mindset (Psychosocial): New Thoughts, New Behaviors Clinical staff led group instruction and group discussion with PowerPoint presentation and patient guidebook. To enhance the learning environment the use of posters, models and videos may be added. Patients will learn and practice techniques for developing effective health and lifestyle goals. Patients will be able to effectively apply the goal setting process learned to develop at least one new personal goal.  The purpose of this lesson is to expose patients to a new skill set of behavior  modification techniques such as techniques setting SMART goals, overcoming barriers, and achieving new thoughts and new behaviors.  Managing Moods and Relationships Clinical staff led group instruction and group discussion with PowerPoint presentation and patient guidebook. To enhance the learning environment the use of posters, models and videos may be added. Patients will learn how emotional and chronic stress factors can impact their health and relationships. They will learn healthy ways to manage their moods and utilize positive coping mechanisms. In addition, ICR patients will learn ways to improve communication skills. The purpose of this lesson is to expose patients to ways of understanding how one's mood and health are intimately connected. Developing a healthy outlook can help build positive relationships and connections with others. Patients will understand the importance of utilizing effective communication skills that include actively listening and being heard. They will learn and understand the importance of the "4 Cs" and especially Connections in fostering of a Healthy Mind-Set.  Healthy Sleep for a Healthy Heart Clinical staff led group instruction and group discussion with PowerPoint presentation and patient guidebook. To enhance the learning environment the use of posters, models and videos may be added. At the conclusion of this workshop, patients will be able to demonstrate knowledge of the importance of sleep to overall health, well-being, and quality of life. They will understand the symptoms of, and treatments for, common sleep disorders. Patients will also be able to identify daytime and nighttime behaviors which impact sleep, and they will be able to apply these tools to help manage sleep-related challenges. The purpose of this lesson is to provide patients with a general overview of sleep and outline the importance of quality sleep. Patients will learn about a few of the most common  sleep disorders. Patients will also be introduced to the concept of "sleep hygiene," and discover ways to self-manage certain sleeping problems through simple daily behavior changes. Finally, the workshop will motivate patients by clarifying the links between quality sleep and their goals of heart-healthy living.   Recognizing and Reducing Stress Clinical staff led group instruction and group discussion with PowerPoint presentation and patient guidebook. To enhance the learning environment the use of posters, models and videos may be added. At the conclusion of this workshop, patients will be able to understand the types of stress reactions, differentiate between acute and chronic stress, and recognize the impact that chronic stress has on their health. They will also be able to apply different coping mechanisms, such as reframing negative self-talk. Patients will have the opportunity to practice a variety of stress management techniques, such as deep abdominal breathing, progressive muscle  relaxation, and/or guided imagery.  The purpose of this lesson is to educate patients on the role of stress in their lives and to provide healthy techniques for coping with it.  Learning Barriers/Preferences:  Learning Barriers/Preferences - 12/19/21 1225       Learning Barriers/Preferences   Learning Barriers Sight;Hearing   reading glasses, Left ear hearing loss per pt   Learning Preferences Written Material             Education Topics:  Knowledge Questionnaire Score:  Knowledge Questionnaire Score - 12/19/21 1226       Knowledge Questionnaire Score   Pre Score 25/28             Core Components/Risk Factors/Patient Goals at Admission:  Personal Goals and Risk Factors at Admission - 12/19/21 1226       Core Components/Risk Factors/Patient Goals on Admission   Heart Failure Yes    Intervention Provide a combined exercise and nutrition program that is supplemented with education, support  and counseling about heart failure. Directed toward relieving symptoms such as shortness of breath, decreased exercise tolerance, and extremity edema.    Expected Outcomes Long term: Adoption of self-care skills and reduction of barriers for early signs and symptoms recognition and intervention leading to self-care maintenance.;Short term: Daily weights obtained and reported for increase. Utilizing diuretic protocols set by physician.;Short term: Attendance in program 2-3 days a week with increased exercise capacity. Reported lower sodium intake. Reported increased fruit and vegetable intake. Reports medication compliance.;Improve functional capacity of life    Hypertension Yes    Intervention Provide education on lifestyle modifcations including regular physical activity/exercise, weight management, moderate sodium restriction and increased consumption of fresh fruit, vegetables, and low fat dairy, alcohol moderation, and smoking cessation.;Monitor prescription use compliance.    Expected Outcomes Short Term: Continued assessment and intervention until BP is < 140/46m HG in hypertensive participants. < 130/823mHG in hypertensive participants with diabetes, heart failure or chronic kidney disease.;Long Term: Maintenance of blood pressure at goal levels.    Lipids Yes    Intervention Provide education and support for participant on nutrition & aerobic/resistive exercise along with prescribed medications to achieve LDL <7049mHDL >56m70m  Expected Outcomes Short Term: Participant states understanding of desired cholesterol values and is compliant with medications prescribed. Participant is following exercise prescription and nutrition guidelines.;Long Term: Cholesterol controlled with medications as prescribed, with individualized exercise RX and with personalized nutrition plan. Value goals: LDL < 70mg79mL > 40 mg.    Stress Yes    Intervention Offer individual and/or small group education and counseling  on adjustment to heart disease, stress management and health-related lifestyle change. Teach and support self-help strategies.;Refer participants experiencing significant psychosocial distress to appropriate mental health specialists for further evaluation and treatment. When possible, include family members and significant others in education/counseling sessions.    Expected Outcomes Short Term: Participant demonstrates changes in health-related behavior, relaxation and other stress management skills, ability to obtain effective social support, and compliance with psychotropic medications if prescribed.;Long Term: Emotional wellbeing is indicated by absence of clinically significant psychosocial distress or social isolation.             Core Components/Risk Factors/Patient Goals Review:   Goals and Risk Factor Review     Row Name 01/21/22 0937             Core Components/Risk Factors/Patient Goals Review   Personal Goals Review Weight Management/Obesity;Heart Failure;Stress;Lipids;Tobacco Cessation;Hypertension  Review Enis has been doing well with exercise for his fitness level last day of exercise was on 01/06/22. Vital signs have been stable. Kroy continues to smoke. Exercise is currently on hold due to abnrmal foot assessment on 01/08/22.       Expected Outcomes Kc will continue to participate in intensive cardiac rehab for exercise, nutrition and lifestyle modifications when cleared to return to exercise.                Core Components/Risk Factors/Patient Goals at Discharge (Final Review):   Goals and Risk Factor Review - 01/21/22 0937       Core Components/Risk Factors/Patient Goals Review   Personal Goals Review Weight Management/Obesity;Heart Failure;Stress;Lipids;Tobacco Cessation;Hypertension    Review Bandon has been doing well with exercise for his fitness level last day of exercise was on 01/06/22. Vital signs have been stable. Kemontae continues to smoke.  Exercise is currently on hold due to abnrmal foot assessment on 01/08/22.    Expected Outcomes Gottfried will continue to participate in intensive cardiac rehab for exercise, nutrition and lifestyle modifications when cleared to return to exercise.             ITP Comments:  ITP Comments     Row Name 12/19/21 1547 12/25/21 1233 01/21/22 0929       ITP Comments Dr. Fransico Him medical director. Introduction to pritikin education program Intensive Cardiac Rehab. Initial orientation packet reviewed with patient. 30 Day ITP Review. Mr Ikard did not start cardiac rehab today due to complaints of feeling lightheaded 30 Day ITP Review. Mehul exercise is currently on hold until he has been cleared to return to exercise due to  complaints of numbness and abnormal assessment on his last day at cardiac rehab on 01/08/22.              Comments: See ITP Comments

## 2022-01-22 ENCOUNTER — Encounter (HOSPITAL_COMMUNITY): Payer: Medicaid Other

## 2022-01-23 ENCOUNTER — Other Ambulatory Visit (HOSPITAL_COMMUNITY): Payer: Self-pay

## 2022-01-23 ENCOUNTER — Ambulatory Visit (HOSPITAL_COMMUNITY)
Admission: RE | Admit: 2022-01-23 | Discharge: 2022-01-23 | Disposition: A | Discharge status: Home / Self Care | Payer: Medicaid Other | Source: Ambulatory Visit | Attending: Cardiology | Admitting: Cardiology

## 2022-01-23 VITALS — BP 118/76 | HR 76 | Wt 147.2 lb

## 2022-01-23 DIAGNOSIS — F17210 Nicotine dependence, cigarettes, uncomplicated: Secondary | ICD-10-CM | POA: Diagnosis not present

## 2022-01-23 DIAGNOSIS — Z95810 Presence of automatic (implantable) cardiac defibrillator: Secondary | ICD-10-CM | POA: Diagnosis not present

## 2022-01-23 DIAGNOSIS — I5022 Chronic systolic (congestive) heart failure: Secondary | ICD-10-CM | POA: Diagnosis not present

## 2022-01-23 DIAGNOSIS — M79661 Pain in right lower leg: Secondary | ICD-10-CM | POA: Insufficient documentation

## 2022-01-23 DIAGNOSIS — M79662 Pain in left lower leg: Secondary | ICD-10-CM | POA: Insufficient documentation

## 2022-01-23 DIAGNOSIS — Z79899 Other long term (current) drug therapy: Secondary | ICD-10-CM | POA: Diagnosis not present

## 2022-01-23 DIAGNOSIS — Z8673 Personal history of transient ischemic attack (TIA), and cerebral infarction without residual deficits: Secondary | ICD-10-CM | POA: Insufficient documentation

## 2022-01-23 DIAGNOSIS — I2510 Atherosclerotic heart disease of native coronary artery without angina pectoris: Secondary | ICD-10-CM | POA: Diagnosis not present

## 2022-01-23 DIAGNOSIS — I428 Other cardiomyopathies: Secondary | ICD-10-CM | POA: Insufficient documentation

## 2022-01-23 MED ORDER — SPIRONOLACTONE 25 MG PO TABS: 25.0000 mg | ORAL_TABLET | Freq: Every evening | ORAL | 3 refills | Status: DC

## 2022-01-23 NOTE — Progress Notes (Signed)
Paramedicine Encounter    Patient ID: [name], male    DOB: Aug 27, 1965, 57 y.o.   MRN: [id number]  Met with Lacey today in clinic where he was seen by Audry Riles, clinical pharmacist.   [name] reports feeling better only having one or two episodes of feeling dizzy when bending over. He states his numbness and tingling in his legs has improved since the episode during cardiac rehab on 12/20. He has been compliant with his medications using his bubble packs over the last few weeks and is nearing the end of the monthly packs.   He reports some shortness of breath walking >100yards at a time and when using stairs. He says sometimes when he notices he has fluid on him he gets short of breath when laying flat and he will take an extra Lasix.   He says he might take an extra Lasix once or twice a week. He does admit to using Lokelma sometimes PRN when he gets leg cramps.     Vitals were obtained by [place] student.   WT- 144lbs at home/ 147lbs in clinic  BP sitting- 118/76 BP standing- 102/68    Lauren assessed lower legs and feet- he has warm skin and strong pedal pulses noted.   Med changes made today-  INCREASE SPIRONOLACTONE  to 66m at night.    He has a bottle of 27mtablets at home and plans to use those and place 1/2 tabs in bottle- has one week of bubble packs left. I will reach out to pharmacy for update in RX and ensure this is reflected in his next delivery of bubble packs.    We reviewed upcoming appointments- he will be seen in APP clinic on 1/18. We planned to meet then- I will call him next week to check in on full dose of spironolactone and how he is tolerating it. He agreed with plan and knows to reach out to me in the mean time if needed.   SOCIAL CONCERNS: [place] this weekend. He denied any other concerns at this time.   HeSalena Saner EMCoppell/04/2022     Patient Care Team: Default, Provider, MD as PCP - General RaSkeet LatchMD as PCP - Cardiology (Cardiology) CaConstance HawMD as PCP - Electrophysiology (Cardiology) RaSkeet LatchMD as Attending Physician (Cardiology) RoMelissa MontaneRN as Case Manager  Patient Active Problem List   Diagnosis Date Noted   Acute combined systolic and diastolic congestive heart failure (HCH[facility]   AKI (acute kidney injury) (HCT[facility]   Acute exacerbation of CHF (congestive heart failure) (HCF[facility]02/14/2023   SVT (supraventricular tachycardia) 06/15/2020   Slurred speech 07/05/2019   ICD (implantable cardioverter-defibrillator) in place 07/20/2018   COPD (chronic obstructive pulmonary disease) (HCCh[facility]11/20/2019   Obstructive sleep apnea 09/08/2016   Autonomic dysfunction 07/15/2015   DJD (degenerative joint disease), lumbar 06/19/2015   Tremor 05/14/2015   Abnormality of gait 05/14/2015   Neurogenic bladder 04/17/2015   Lumbar strain 03/23/2015   Leg weakness 03/23/2015   Major depression, chronic    History of stroke 02/26/2015   Benign essential HTN    ETOH abuse    Tobacco abuse    Chronic combined systolic and diastolic heart failure (HCLaF[facility]   Alcoholic cardiomyopathy (HCWest Springfield04/06/2014   Smoking greater than 20 pack years 04/19/2014   Polysubstance abuse (HCVian03/02/2014    Current  Outpatient Medications:    albuterol (PROAIR HFA) 108 (90 Base) MCG/ACT inhaler, Inhale 2 puffs into the lungs every 6 (six) hours as needed for shortness of breath., Disp: 18 g, Rfl: 6   aspirin 81 MG EC tablet, Take 81 mg by mouth in the morning. Swallow whole., Disp: , Rfl:    atorvastatin (LIPITOR) 40 MG tablet, Take 1 tablet (40 mg total) by mouth daily., Disp: 90 tablet, Rfl: 1   cetirizine (ZYRTEC) 10 MG tablet, TAKE 1 TABLET (10 MG TOTAL) BY MOUTH DAILY. (Patient not taking: Reported on 01/23/2022), Disp: 30 tablet, Rfl: 0   dapagliflozin propanediol  (FARXIGA) 10 MG TABS tablet, Take 1 tablet (10 mg total) by mouth daily before breakfast., Disp: 30 tablet, Rfl: 11   digoxin (LANOXIN) 0.125 MG tablet, Take 1 tablet (0.125 mg total) by mouth daily., Disp: 90 tablet, Rfl: 1   fluticasone-salmeterol (ADVAIR DISKUS) 250-50 MCG/ACT AEPB, Inhale 1 puff into the lungs in the morning and at bedtime., Disp: 1 each, Rfl: 3   furosemide (LASIX) 20 MG tablet, Take 2 tablets (40 mg total) every day., Disp: 180 tablet, Rfl: 2   MAGNESIUM OXIDE -MG SUPPLEMENT PO, Take 1 tablet by mouth in the morning., Disp: , Rfl:    metoprolol succinate (TOPROL-XL) 100 MG 24 hr tablet, Take 1 tablet (100 mg total) by mouth at bedtime., Disp: 90 tablet, Rfl: 3   mirtazapine (REMERON SOL-TAB) 15 MG disintegrating tablet, Take 1 tablet (15 mg total) by mouth at bedtime., Disp: 90 tablet, Rfl: 1   sacubitril-valsartan (ENTRESTO) 24-26 MG, Take 1 tablet by mouth 2 (two) times daily., Disp: 60 tablet, Rfl: 6   sodium zirconium cyclosilicate (LOKELMA) 10 g PACK packet, Take 10 g by mouth as needed., Disp: , Rfl:    spironolactone (ALDACTONE) 25 MG tablet, Take 1 tablet (25 mg total) by mouth at bedtime., Disp: 90 tablet, Rfl: 3   tiotropium (SPIRIVA HANDIHALER) 18 MCG inhalation capsule, Place 1 capsule (18 mcg total) into inhaler and inhale daily. (Patient not taking: Reported on 01/23/2022), Disp: 30 capsule, Rfl: 6   Varenicline Tartrate, Starter, (CHANTIX STARTING MONTH PAK) 0.5 MG X 11 & 1 MG X 42 TBPK, Take 0.5 mg by mouth as directed. (Patient not taking: Reported on 01/23/2022), Disp: 53 each, Rfl: 0 Allergies  Allergen Reactions   Penicillins Other (See Comments)    Blisters  Has patient had a PCN reaction causing immediate rash, facial/tongue/throat swelling, SOB or lightheadedness with hypotension: Yes Has patient had a PCN reaction causing severe rash involving mucus membranes or skin necrosis: Yes Has patient had a PCN reaction that required hospitalization: No Has  patient had a PCN reaction occurring within the last 10 years: No If all of the above answers are "NO", then may proceed with Cephalosporin use.      Social History   Socioeconomic History   Marital status: Single    Spouse name: Not on file   Number of children: 1   Years of education: 12   Highest education level: Not on file  Occupational History   Not on file  Tobacco Use   Smoking status: Every Day    Packs/day: 1.00    Years: 20.00    Total pack years: 20.00    Types: Cigarettes   Smokeless tobacco: Never   Tobacco comments:    05/14/16 smoking 1 PPD  Vaping Use   Vaping Use: Never used  Substance and Sexual Activity   Alcohol use: Yes  Alcohol/week: 1.0 standard drink of alcohol    Types: 1 Cans of beer per week    Comment: socially    Drug use: Not Currently    Types: Cocaine    Comment: stopped using cocaine 11/19/14   Sexual activity: Not on file  Other Topics Concern   Not on file  Social History Narrative   Lives alone, nurse comes 2 hrs daily   Social Determinants of Health   Financial Resource Strain: High Risk (11/14/2021)   Overall Financial Resource Strain (CARDIA)    Difficulty of Paying Living Expenses: Hard  Food Insecurity: Food Insecurity Present (12/24/2021)   Hunger Vital Sign    Worried About Running Out of Food in the Last Year: Sometimes true    Ran Out of Food in the Last Year: Sometimes true  Transportation Needs: Unmet Transportation Needs (01/02/2022)   PRAPARE - Hydrologist (Medical): Yes    Lack of Transportation (Non-Medical): Yes  Physical Activity: Not on file  Stress: Not on file  Social Connections: Not on file  Intimate Partner Violence: Not on file    Physical Exam      Future Appointments  Date Time Provider [place]  02/06/2022 11:00 AM [facility] PA/NP [facility] None  02/07/2022 10:15 AM MC-CRE[facility] PHASE II EXC MC-REHSC None  02/10/2022  7:25 AM [facility] DEVICE REMOTES  [facility]  02/10/2022 10:15 AM MC-[facility] PHASE II EXC MC-REHSC None  02/12/2022 10:15 AM [facility] PHASE II EXC MC-RE[facility]SC None  02/14/2022 10:15 AM MC-[facility] PHASE II EXC MC-REHSC None  02/17/2022 10:15 AM MC-[facility] PHASE II EXC MC-REHSC None  02/19/2022 10:15 AM MC-[facility] PHASE II EXC MC-REHSC None  02/21/2022 10:15 AM MC-[facility] PHASE II EXC MC-RE[facility]SC None  02/24/2022 10:15 AM MC-[facility] PHASE II EXC MC-REHSC None  02/25/2022  2:45 PM [name], RN CHL-POPH None  02/26/2022 10:15 AM MC-[facility] PHASE II EXC MC-REHSC None  02/28/2022 10:15 AM MC-[facility] PHASE II EXC MC-REHSC None  05/12/2022  7:25 AM [facility] DEVICE REMOTES [facility]  06/12/2022 10:30 AM [name], MD CHW-[facility]WW None  08/11/2022  7:25 AM [facility] DEVICE REMOTES [facility]  11/10/2022  7:25 AM [facility] DEVICE REMOTES [facility]  02/09/2023  7:25 AM [facility] DEVICE REMOTES [facility]     ACTION: Home visit completed

## 2022-01-23 NOTE — Patient Instructions (Signed)
It was a pleasure seeing you today!  MEDICATIONS: -We are changing your medications today -Increase spironolactone to 25 mg (1 tablet) daily -Call if you have questions about your medications.   NEXT APPOINTMENT: Return to clinic in 2 weeks with [facility].  In general, to take care of your heart failure: -Limit your fluid intake to 2 Liters (half-gallon) per day.   -Limit your salt intake to ideally 2-3 grams (2000-3000 mg) per day. -Weigh yourself daily and record, and bring that "weight diary" to your next appointment.  (Weight gain of 2-3 pounds in 1 day typically means fluid weight.) -The medications for your heart are to help your heart and help you live longer.   -Please contact us before stopping any of your heart medications.  Call the clinic at [phone] with questions or to reschedule future appointments.

## 2022-01-23 NOTE — Progress Notes (Signed)
DUPLICATE- CREATED IN ERROR.       ACTION: Home visit completed

## 2022-01-24 ENCOUNTER — Encounter (HOSPITAL_COMMUNITY): Payer: Medicaid Other

## 2022-01-27 ENCOUNTER — Encounter (HOSPITAL_COMMUNITY): Payer: Medicaid Other

## 2022-01-28 ENCOUNTER — Telehealth (HOSPITAL_COMMUNITY): Payer: Self-pay

## 2022-01-28 NOTE — Telephone Encounter (Signed)
Spoke to Mr. Rock today who reports he is doing well since increasing spironolactone from last weeks visit. He reports his weight is down 4lbs to 143lbs. He is not short of breath or dizzy. He denied any swelling. He states he is urinating frequently and not needing to take an extra fluid pill. He denied any other complaints. He will need his bubble packs delivered I will follow up on this and plan to see him in clinic next week. He agreed with plan. Call complete.   [name], [place] 01/28/2022

## 2022-01-29 ENCOUNTER — Encounter (HOSPITAL_COMMUNITY): Payer: Medicaid Other

## 2022-01-31 ENCOUNTER — Encounter (HOSPITAL_COMMUNITY): Payer: Medicaid Other

## 2022-02-03 ENCOUNTER — Encounter (HOSPITAL_COMMUNITY): Payer: Medicaid Other

## 2022-02-05 ENCOUNTER — Encounter (HOSPITAL_COMMUNITY): Payer: Medicaid Other

## 2022-02-05 ENCOUNTER — Telehealth (HOSPITAL_COMMUNITY): Payer: Self-pay

## 2022-02-05 NOTE — Telephone Encounter (Signed)
Called to confirm/remind patient of their appointment at the [facility] on 02/06/22.   Patient reminded to bring all medications and/or complete list.  Confirmed patient has transportation. Gave directions, instructed to utilize [place] parking.  Confirmed appointment prior to ending call.

## 2022-02-06 ENCOUNTER — Other Ambulatory Visit (HOSPITAL_COMMUNITY): Payer: Self-pay

## 2022-02-06 ENCOUNTER — Ambulatory Visit (HOSPITAL_COMMUNITY)
Admission: RE | Admit: 2022-02-06 | Discharge: 2022-02-06 | Disposition: A | Discharge status: Home / Self Care | Payer: Medicaid Other | Source: Ambulatory Visit | Attending: Family Medicine | Admitting: Family Medicine

## 2022-02-06 VITALS — BP 130/70 | HR 105 | Wt 145.0 lb

## 2022-02-06 DIAGNOSIS — I5022 Chronic systolic (congestive) heart failure: Secondary | ICD-10-CM | POA: Insufficient documentation

## 2022-02-06 DIAGNOSIS — F17210 Nicotine dependence, cigarettes, uncomplicated: Secondary | ICD-10-CM | POA: Insufficient documentation

## 2022-02-06 DIAGNOSIS — I5042 Chronic combined systolic (congestive) and diastolic (congestive) heart failure: Secondary | ICD-10-CM | POA: Diagnosis not present

## 2022-02-06 DIAGNOSIS — Z7984 Long term (current) use of oral hypoglycemic drugs: Secondary | ICD-10-CM | POA: Insufficient documentation

## 2022-02-06 DIAGNOSIS — Z8673 Personal history of transient ischemic attack (TIA), and cerebral infarction without residual deficits: Secondary | ICD-10-CM | POA: Insufficient documentation

## 2022-02-06 DIAGNOSIS — I2510 Atherosclerotic heart disease of native coronary artery without angina pectoris: Secondary | ICD-10-CM | POA: Insufficient documentation

## 2022-02-06 DIAGNOSIS — Z8249 Family history of ischemic heart disease and other diseases of the circulatory system: Secondary | ICD-10-CM | POA: Diagnosis not present

## 2022-02-06 DIAGNOSIS — J449 Chronic obstructive pulmonary disease, unspecified: Secondary | ICD-10-CM | POA: Insufficient documentation

## 2022-02-06 DIAGNOSIS — Z79899 Other long term (current) drug therapy: Secondary | ICD-10-CM | POA: Diagnosis not present

## 2022-02-06 DIAGNOSIS — I428 Other cardiomyopathies: Secondary | ICD-10-CM | POA: Insufficient documentation

## 2022-02-06 DIAGNOSIS — I110 Hypertensive heart disease with heart failure: Secondary | ICD-10-CM | POA: Diagnosis not present

## 2022-02-06 DIAGNOSIS — Z95810 Presence of automatic (implantable) cardiac defibrillator: Secondary | ICD-10-CM | POA: Diagnosis not present

## 2022-02-06 LAB — BASIC METABOLIC PANEL
Anion gap: 12 (ref 5–15)
BUN: 13 mg/dL (ref 6–20)
CO2: 21 mmol/L — ABNORMAL LOW (ref 22–32)
Calcium: 9.4 mg/dL (ref 8.9–10.3)
Chloride: 103 mmol/L (ref 98–111)
Creatinine, Ser: 0.98 mg/dL (ref 0.61–1.24)
GFR, Estimated: >60 mL/min (ref >60)
Glucose, Bld: 116 mg/dL — ABNORMAL HIGH (ref 70–99)
Potassium: 3.5 mmol/L (ref 3.5–5.1)
Sodium: 136 mmol/L (ref 135–145)

## 2022-02-06 LAB — DIGOXIN LEVEL: Digoxin Level: 0.3 ng/mL — ABNORMAL LOW (ref 0.8–2.0)

## 2022-02-06 MED ORDER — ENTRESTO 49-51 MG PO TABS: 1.0000 | ORAL_TABLET | Freq: Two times a day (BID) | ORAL | 8 refills | Status: DC

## 2022-02-06 NOTE — Progress Notes (Signed)
Paramedicine Encounter    Patient ID: [name], male    DOB: 1965-01-28, 57 y.o.   MRN: [id number]   Met with Arad in clinic who reports feeling really well. He denied dizziness, shortness of breath, or weight gain/swelling. He has been compliant with his medications using his bubble packs.   Today he was seen by Ellen Henri- PA-C. She increased Entresto to 49-51 dose but stated he could complete the current week of bubble packs he has before the increase has to be started. I will ensure same gets completed by [facility].   Labs drawn- noted stable as of 1700.   I will follow up in two weeks.   Appointments made and confirmed.  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 He says he is working daily from 0830-230. He is excited to be working and knows to let me know if he has any symptoms. He agreed.   Visit complete.   [name], EMT-Paramedic [phone] 02/06/2022    ACTION: Home visit completed

## 2022-02-06 NOTE — Progress Notes (Signed)
Advanced [facility] Failure Clinic Progress Note    PCP: Default, Provider, MD HF Cardiology: Dr. Shirlee Latch  57 y.o. with history of nonischemic cardiomyopathy was referred by Eligha Bridegroom for evaluation of CHF.  Cardiomyopathy has been diagnosed since 2016 when echo showed EF 20%.  He has a history of cocaine use and ETOH abuse. Currently, drinking about 3 beers/day.  He is smoking.  No recent cocaine.  He had a stroke in 2017 in setting of uncontrolled HTN and cocaine abuse.  He has a Secondary school teacher ICD.  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  Last echo in 2/23 showed EF < 20%, severe LV dilation, moderately decreased RV systolic function with mild RV enlargement. He has 13 siblings, none of whom have known cardiac disease.  His grandmother had CHF and there apparently was conversation about a heart transplant but she passed away.   Follow up 2022/09/21 with Dr. Shirlee Latch, NYHA II-III, volume stable. CPX, cMRI and evaluation for BAT arranged.   CPX (9/23) showed severe functional limitation due to HF and deconditioning.   Follow up 9/23, NYHA III-IIIb , labs showed AKI and GDMT held. Required a dose of Lokelma for hyperkalemia.  Cardiac MRI 10/23 (difficult images due to ICD artifact) LVEF 24%, RVEF 34%.  He presents today for f/u and titration of meds. Doing well. Says he feels significantly better. Breathing much improved. Less fatigue. NYHA class I-II. No LEE. Improved compliance w/ meds. Followed by paramedicine. Had bubble pill packs. Still smoking ~1/4 ppd. Tried chantix but unable to tolerate due to nightmares. Still drinking beer 3-6 per sitting, mainly on weekends.   Unable to interrogate device today Animator out of service)    Labs (6/23): hgb 15.6, BNP 673, K 4.2, creatinine 1.23 Labs September 21, 2022): K 3.9, creatinine 1.13 Labs (9/23): K 4.8, creatinine 1.53 Labs (10/23): K 3.6, creatinine 1.07 Labs (11/23): K 4.3, creatinine 1.4, BNP 152 Labs (5/23): 4.3, creatinine  1.24 Lab (12/23): K 4.3, creatinine 1.24   PMH: 1. H/o seizure disorder: ?ETOH. 2. HTN 3. 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  4. H/o SVT 5. OSA 6. COPD: Active smoker 7. VT: St Jude ICD.  ATP 6/23.  8. Chronic systolic CHF: Nonischemic cardiomyopathy.  Documented since at least 2016. St Jude ICD.  - Echo (3/16): EF 20% - Echo (2/23): EF < 20%, severe LV dilation, moderately decreased RV systolic function with mild RV enlargement.  - Coronary CTA (6/23): 81st percentile calcium score, mild nonobstructive CAD.  - CPX (9/23): Peak VO2: 14.3 (43% predicted peak VO2), VE/VCO2 slope: 39, OUES: 1.10, Peak RER: 0.70  - Cardiac MRI 10/23 (difficult images due to ICD artifact) LVEF 24%, RVEF 34%. 9. Prior h/o cocaine  Social History   Socioeconomic History   Marital status: Single    Spouse name: Not on file   Number of children: 1   Years of education: 12   Highest education level: Not on file  Occupational History   Not on file  Tobacco Use   Smoking status: Every Day    Packs/day: 1.00    Years: 20.00    Total pack years: 20.00    Types: Cigarettes   Smokeless tobacco: Never   Tobacco comments:    05/14/16 smoking 1 PPD  Vaping Use   Vaping Use: Never used  Substance and Sexual Activity   Alcohol use: Yes    Alcohol/week: 1.0 standard drink of alcohol    Types: 1 Cans of beer per week  Comment: socially    Drug use: Not Currently    Types: Cocaine    Comment: stopped using cocaine 11/19/14   Sexual activity: Not on file  Other Topics Concern   Not on file  Social History Narrative   Lives alone, nurse comes 2 hrs daily   Social Determinants of Health   Financial Resource Strain: High Risk (11/14/2021)   Overall Financial Resource Strain (CARDIA)    Difficulty of Paying Living Expenses: Hard  Food Insecurity: Food Insecurity Present (12/24/2021)   Hunger Vital Sign    Worried About Running Out of Food in the Last Year: Sometimes true    Ran Out of Food  in the Last Year: Sometimes true  Transportation Needs: Unmet Transportation Needs (01/02/2022)   PRAPARE - Hydrologist (Medical): Yes    Lack of Transportation (Non-Medical): Yes  Physical Activity: Not on file  Stress: Not on file  Social Connections: Not on file  Intimate Partner Violence: Not on file   Family History  Problem Relation Age of Onset   Hypertension Mother    Hyperlipidemia Mother    Hypertension Father    Stroke Maternal Aunt    Hypertension Sister    Hypertension Brother    ROS: All systems reviewed and negative except as per HPI.   Current Outpatient Medications  Medication Sig Dispense Refill   albuterol (PROAIR HFA) 108 (90 Base) MCG/ACT inhaler Inhale 2 puffs into the lungs every 6 (six) hours as needed for shortness of breath. 18 g 6   aspirin 81 MG EC tablet Take 81 mg by mouth in the morning. Swallow whole.     atorvastatin (LIPITOR) 40 MG tablet Take 1 tablet (40 mg total) by mouth daily. 90 tablet 1   dapagliflozin propanediol (FARXIGA) 10 MG TABS tablet Take 1 tablet (10 mg total) by mouth daily before breakfast. 30 tablet 11   digoxin (LANOXIN) 0.125 MG tablet Take 1 tablet (0.125 mg total) by mouth daily. 90 tablet 1   fluticasone-salmeterol (ADVAIR DISKUS) 250-50 MCG/ACT AEPB Inhale 1 puff into the lungs in the morning and at bedtime. 1 each 3   furosemide (LASIX) 20 MG tablet Take 2 tablets (40 mg total) every day. 180 tablet 2   MAGNESIUM OXIDE -MG SUPPLEMENT PO Take 1 tablet by mouth in the morning.     metoprolol succinate (TOPROL-XL) 100 MG 24 hr tablet Take 1 tablet (100 mg total) by mouth at bedtime. 90 tablet 3   mirtazapine (REMERON SOL-TAB) 15 MG disintegrating tablet Take 1 tablet (15 mg total) by mouth at bedtime. 90 tablet 1   sacubitril-valsartan (ENTRESTO) 49-51 MG Take 1 tablet by mouth 2 (two) times daily. 60 tablet 8   sodium zirconium cyclosilicate (LOKELMA) 10 g PACK packet Take 10 g by mouth as needed.      spironolactone (ALDACTONE) 25 MG tablet Take 1 tablet (25 mg total) by mouth at bedtime. 90 tablet 3   tiotropium (SPIRIVA HANDIHALER) 18 MCG inhalation capsule Place 1 capsule (18 mcg total) into inhaler and inhale daily. 30 capsule 6   No current facility-administered medications for this encounter.   Wt Readings from Last 3 Encounters:  02/06/22 65.8 kg (145 lb)  01/23/22 66.8 kg (147 lb 3.2 oz)  01/02/22 65.3 kg (144 lb)   BP 130/70   Pulse (!) 105   Wt 65.8 kg (145 lb)   SpO2 98%   BMI 21.11 kg/m  PHYSICAL EXAM: General:  Well appearing. No  respiratory difficulty HEENT: normal Neck: supple. no JVD. Carotids 2+ bilat; no bruits. No lymphadenopathy or thyromegaly appreciated. Cor: PMI nondisplaced. Regular rate & rhythm. No rubs, gallops or murmurs. Lungs: clear Abdomen: soft, nontender, nondistended. No hepatosplenomegaly. No bruits or masses. Good bowel sounds. Extremities: no cyanosis, clubbing, rash, edema Neuro: alert & oriented x 3, cranial nerves grossly intact. moves all 4 extremities w/o difficulty. Affect pleasant.   Assessment/Plan: 1. Chronic systolic CHF: Nonischemic cardiomyopathy.  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  Cause of CMP uncertain => prior cocaine abuse but not currently.  He drinks about 3 beers/day, denies heavier drinking in past but apparently there was concern that he had had ETOH withdrawal seizures in the past.  His grandmother had CHF but apparently none of his 13 siblings have known cardiac disease. EF has been low since at least 2016.  Most recent echo in 2/23 showed EF < 20%, severe LV dilation, moderately decreased RV systolic function with mild RV enlargement. He has a Research officer, political party ICD.  CPX 9/23 showed severe functional limitation from both HF and deconditioning. Cardiac MRI 10/23, technically difficult study due to ICD artifact, LVEF 24%, no evidence of infiltrative disease. NYHA class I-II symptoms. Euvolemic on exam.  - Continue  Farxiga 10 mg daily. [place] Entresto to 49-51 mg bid  - Continue Lasix 40 mg daily  - Continue digoxin 0.125 mg daily. Check dig level today. - Continue Toprol XL 100 mg daily. - Continue spironolactone 25 mg daily.   - Encouraged to cut back on ETOH. - Bifurcation too high for [employer], working on getting Medicaid approval for commercial baroreceptor activation therapy.  - We briefly discussed advanced therapies. Not transplant candidate with on-going tobacco use.  Would likely be VAD candidate if needed.  - Check BMP today  2. COPD: He has cut back but is still smoking.  Failed nicotine patches. Failed Chantix (stopped due to nightmares)   F/u w/ pharmD in ~4 wks and APP in 8 wks for further med titration. Appreciate para medicine.   Lyda Jester, PA-C  02/06/2022

## 2022-02-06 NOTE — Patient Instructions (Addendum)
Thank you for coming in today  Labs were done today, if any labs are abnormal the clinic will call you No news is good news  INCREASE Entresto 49/51 mg 1 tablet twice daily   Your physician recommends that you schedule a follow-up appointment in:  6 weeks in pharmacy  10 weeks in clinic     Do the following things EVERYDAY: Weigh yourself in the morning before breakfast. Write it down and keep it in a log. Take your medicines as prescribed Eat low salt foods--Limit salt (sodium) to 2000 mg per day.  Stay as active as you can everyday Limit all fluids for the day to less than 2 liters  At the [facility], you and your health needs are our priority. As part of our continuing mission to provide you with exceptional heart care, we have created designated Provider Care Teams. These Care Teams include your primary Cardiologist (physician) and Advanced Practice Providers (APPs- Physician Assistants and Nurse Practitioners) who all work together to provide you with the care you need, when you need it.   You may see any of the following providers on your designated Care Team at your next follow up: Dr Glori Bickers Dr Loralie Champagne Dr. Roxana Hires, NP Lyda Jester, [place] [facility] [place], [place] Forestine Na, NP Audry Riles, PharmD   Please be sure to bring in all your medications bottles to every appointment.   If you have any questions or concerns before your next appointment please send [name] a message through [place] or call our office at [phone].    TO LEAVE A MESSAGE FOR THE NURSE SELECT OPTION 2, PLEASE LEAVE A MESSAGE INCLUDING: YOUR NAME DATE OF BIRTH CALL BACK NUMBER REASON FOR CALL**this is important as we prioritize the call backs  YOU WILL RECEIVE A CALL BACK THE SAME DAY AS LONG AS YOU CALL BEFORE 4:00 PM

## 2022-02-07 ENCOUNTER — Encounter (HOSPITAL_COMMUNITY): Payer: Medicaid Other

## 2022-02-07 ENCOUNTER — Telehealth (HOSPITAL_COMMUNITY): Payer: Self-pay | Admitting: *Deleted

## 2022-02-07 NOTE — Telephone Encounter (Addendum)
Spoke with [name] he is not interested in returning to intensive cardiac rehab. Will discharge from the program at this time. [name] attend 4 exercise session and 2  education classes between 12/19/21- 01/06/22  . [name] exercised between 12/19/21- 01/06/22. Harrell Gave RN BSN

## 2022-02-10 ENCOUNTER — Ambulatory Visit: Payer: Medicaid Other | Attending: Cardiology

## 2022-02-10 ENCOUNTER — Encounter (HOSPITAL_COMMUNITY): Payer: Medicaid Other

## 2022-02-10 DIAGNOSIS — I5042 Chronic combined systolic (congestive) and diastolic (congestive) heart failure: Secondary | ICD-10-CM | POA: Diagnosis not present

## 2022-02-11 ENCOUNTER — Telehealth (HOSPITAL_COMMUNITY): Payer: Self-pay

## 2022-02-11 ENCOUNTER — Ambulatory Visit: Payer: Medicaid Other | Attending: Cardiology

## 2022-02-11 DIAGNOSIS — Z95810 Presence of automatic (implantable) cardiac defibrillator: Secondary | ICD-10-CM

## 2022-02-11 DIAGNOSIS — I5022 Chronic systolic (congestive) heart failure: Secondary | ICD-10-CM

## 2022-02-11 NOTE — Telephone Encounter (Signed)
Spoke to [name] following up on conversation with Sharman Cheek RN with device clinic who reports [name] shows some fluid retention. Dr. Aundra Dubin ordered him to add extra 20mg  Lasix for the next three days in the evenings.   [name] agreed and said he has a bottle of 20mg  Lasix at home and took one while on the phone with me ensuring he took the first evening dose requested by Dr. Aundra Dubin. He knows to do so tomorrow night and Thursday night as well. He denied any shortness of breath only stating he woke up with a cough and felt like he might have some fluid but says now he feels okay. He said his weight is 144lbs today.   I will follow up to check in on Thursday via telephone- he agreed with plan.   Call complete.   [name], [place] 02/11/2022

## 2022-02-11 NOTE — Progress Notes (Signed)
Spoke with patient and provided Dr Claris Gladden recommendations to take Furosemide 40 mg every morning and 20 mg tablet in the after noon for next 3 days only.  After 3rd day return to taking the prescribed dosage of 2 tablets every morning (the amount that is bubble wrapped).  He verbalized understanding.

## 2022-02-11 NOTE — Progress Notes (Signed)
EPIC Encounter for ICM Monitoring  Patient Name: [name] is a 57 y.o. male Date: 02/11/2022 Primary Care Physican: Default, Provider, MD Primary Cardiologist: Aundra Dubin Electrophysiologist: Curt Bears 12/02/2021 Weight: 145 lbs (baseline 140 lbs per patient)     12/03/2021 Weight: 147.8 lbs  12/09/2021 Weight: 140 lbs (baseline) 02/11/2022 Weight: 144 lbs (baseline 140-141 lbs)                                                            Spoke with patient and heart failure questions reviewed.  Transmission results reviewed.  Pt has SOB, 3 lb weight gain and woke with cough this morning which is his symptom for fluid retention.      Diet:  Does not follow low salt diet   CorVue thoracic impedance suggesting possible fluid accumulation starting 1/20.    Prescribed:  Furosemide 20 mg take 2 tablets (40 mg total) by mouth daily (increased 12/26/21).  Confirmed 1/23 with [facility] that patient is taking 40 mg daily. Spironolactone 25 mg take 0.5 tablet by mouth at bedtime   Labs:  Pt being followed by [name], EMT paramedicine program.   01/07/2022 Creatinine 1.24, BUN 24, Potassium 4.3, Sodium 136, GFR >60 01/02/2022 Creatinine 1.19, BUN 21, Potassium 4.0, Sodium 135, GFR >60 12/23/2021 Creatinine 1.19, BUN 21, Potassium 4.0, Sodium 135, GFR >60 11/22/2021 Creatinine 1.40, BUN 29, Potassium 4.3, Sodium 137, GFR 59  11/01/2021 Creatinine 1.07, BUN 11, Potassium 3.6, Sodium 137, GFR >60  10/18/2021 Creatinine 0.88, BUN 12, Potassium 3.8, Sodium 137, GFR >60  10/10/2021 Creatinine 1.53, BUN 38, Potassium 4.8, Sodium 133, GFR 53  A complete set of results can be found in Results Review.   Recommendations:  Sent to Dr Aundra Dubin for review and recommendation if needed.  Advised to limit salt intake to 2000 mg daily and fluid intake to 64 oz daily.     Follow-up plan: ICM clinic phone appointment on 02/17/2022 to recheck fluid levels.    91 day device clinic remote transmission  05/12/2022.     EP/Cardiology Office Visits: 04/21/2022 with HF Clinic.     Copy of ICM check sent to Dr. Curt Bears.     3 month ICM trend: 02/10/2022.    12-14 Month ICM trend:     Rosalene Billings, RN 02/11/2022 8:08 AM

## 2022-02-11 NOTE — Progress Notes (Signed)
Increase Lasix to 40 qam/20 qpm x 3 days then back to Lasix 40 daily.

## 2022-02-12 ENCOUNTER — Encounter (HOSPITAL_COMMUNITY): Payer: Medicaid Other

## 2022-02-12 LAB — CUP PACEART REMOTE DEVICE CHECK
Battery Remaining Longevity: 65 mo
Battery Remaining Percentage: 64 %
Battery Voltage: 2.98 V
Brady Statistic RV Percent Paced: 0 %
Date Time Interrogation Session: 20240122020014
HighPow Impedance: 62 Ohm
HighPow Impedance: 62 Ohm
Implantable Lead Connection Status: 753985
Implantable Lead Implant Date: 20191230
Implantable Lead Location: 753860
Implantable Lead Model: 7122
Implantable Pulse Generator Implant Date: 20191230
Lead Channel Impedance Value: 440 Ohm
Lead Channel Pacing Threshold Amplitude: 0.75 V
Lead Channel Pacing Threshold Pulse Width: 0.5 ms
Lead Channel Sensing Intrinsic Amplitude: 11.6 mV
Lead Channel Setting Pacing Amplitude: 2.5 V
Lead Channel Setting Pacing Pulse Width: 0.5 ms
Lead Channel Setting Sensing Sensitivity: 0.5 mV
Pulse Gen Serial Number: 9850980
Zone Setting Status: 755011

## 2022-02-13 ENCOUNTER — Telehealth (HOSPITAL_COMMUNITY): Payer: Self-pay

## 2022-02-13 NOTE — Telephone Encounter (Signed)
Spoke to [name] this morning who reports feeling great today and denied any shortness of breath, coughing upon waking up, difficulty sleeping, weight gain or swelling. His weight is down 2 more lbs at 142lbs this morning. He reports he has his last extra dose of lasix to take tonight and he will have completed the order of three days with extra lasix in the evening per Dr. Aundra Dubin. He is at work currently and was unable to have a home visit but agreed to meeting next week sometime. I will call and follow up early next week to plan same. I will forward to Sharman Cheek, RN to make her aware of improvement.   Call complete.   [name], [place] 02/13/2022

## 2022-02-14 ENCOUNTER — Encounter (HOSPITAL_COMMUNITY): Payer: Medicaid Other

## 2022-02-17 ENCOUNTER — Ambulatory Visit: Payer: Medicaid Other | Attending: Cardiology

## 2022-02-17 ENCOUNTER — Ambulatory Visit (HOSPITAL_COMMUNITY): Payer: Medicaid Other

## 2022-02-17 DIAGNOSIS — Z95810 Presence of automatic (implantable) cardiac defibrillator: Secondary | ICD-10-CM

## 2022-02-17 DIAGNOSIS — I5022 Chronic systolic (congestive) heart failure: Secondary | ICD-10-CM

## 2022-02-17 NOTE — Progress Notes (Signed)
EPIC Encounter for ICM Monitoring  Patient Name: [name] is a 57 y.o. male Date: 02/17/2022 Primary Care Physican: Default, Provider, MD Primary Cardiologist: Aundra Dubin Electrophysiologist: Curt Bears 12/02/2021 Weight: 145 lbs (baseline 140 lbs per patient)     12/03/2021 Weight: 147.8 lbs  12/09/2021 Weight: 140 lbs (baseline) 02/11/2022 Weight: 144 lbs (baseline 140-141 lbs) 02/17/2022 Weight: 142 lbs                                                            Spoke with patient and heart failure questions reviewed.  Transmission results reviewed.  Pt fluid symptoms of wt gain, SOB and cough have resolved.       Diet:  Does not follow low salt diet   CorVue thoracic impedance suggesting fluid levels returned to normal after taking Furosemide 40 mg every morning and 20 mg tablet in the afternoon for 3 days     Prescribed:  Furosemide 20 mg take 2 tablets (40 mg total) by mouth daily (increased 12/26/21).  Spironolactone 25 mg take 0.5 tablet by mouth at bedtime   Labs:  Pt being followed by [name], EMT paramedicine program.   01/07/2022 Creatinine 1.24, BUN 24, Potassium 4.3, Sodium 136, GFR >60 01/02/2022 Creatinine 1.19, BUN 21, Potassium 4.0, Sodium 135, GFR >60 12/23/2021 Creatinine 1.19, BUN 21, Potassium 4.0, Sodium 135, GFR >60 11/22/2021 Creatinine 1.40, BUN 29, Potassium 4.3, Sodium 137, GFR 59  11/01/2021 Creatinine 1.07, BUN 11, Potassium 3.6, Sodium 137, GFR >60  10/18/2021 Creatinine 0.88, BUN 12, Potassium 3.8, Sodium 137, GFR >60  10/10/2021 Creatinine 1.53, BUN 38, Potassium 4.8, Sodium 133, GFR 53  A complete set of results can be found in Results Review.   Recommendations:  No changes and encouraged to call if experiencing any fluid symptoms.   Follow-up plan: ICM clinic phone appointment on 03/17/2022.    91 day device clinic remote transmission 05/12/2022.     EP/Cardiology Office Visits: 04/21/2022 with HF Clinic.     Copy of ICM check sent to Dr.  Curt Bears.     3 month ICM trend: 02/17/2022.    12-14 Month ICM trend:     Rosalene Billings, RN 02/17/2022 1:49 PM

## 2022-02-19 ENCOUNTER — Ambulatory Visit (HOSPITAL_COMMUNITY): Payer: Medicaid Other

## 2022-02-21 ENCOUNTER — Ambulatory Visit (HOSPITAL_COMMUNITY): Payer: Medicaid Other

## 2022-02-24 ENCOUNTER — Ambulatory Visit (HOSPITAL_COMMUNITY): Payer: Medicaid Other

## 2022-02-25 ENCOUNTER — Other Ambulatory Visit: Payer: Medicaid Other | Admitting: *Deleted

## 2022-02-25 ENCOUNTER — Encounter: Payer: Self-pay | Admitting: *Deleted

## 2022-02-25 NOTE — Patient Instructions (Signed)
Visit Information  Mr. Couey was given information about Medicaid Managed Care team care coordination services as a part of their Healthy [facility] Medicaid benefit. [name] verbally consented to engagement with the [facility] Managed Care team.   If you are experiencing a medical emergency, please call 911 or report to your local emergency department or urgent care.   If you have a non-emergency medical problem during routine business hours, please contact your provider's office and ask to speak with a nurse.   For questions related to your Healthy [facility] health plan, please call: [phone] or visit the homepage here: [email or web address]  If you would like to schedule transportation through your Healthy [facility] plan, please call the following number at least 2 days in advance of your appointment: [phone]  For information about your ride after you set it up, call Ride Assist at [phone]. Use this number to activate a Will Call pickup, or if your transportation is late for a scheduled pickup. Use this number, too, if you need to make a change or cancel a previously scheduled reservation.  If you need transportation services right away, call [phone]. The after-hours call center is staffed 24 hours to handle ride assistance and urgent reservation requests (including discharges) 365 days a year. Urgent trips include sick visits, hospital discharge requests and life-sustaining treatment.  Call the [place] at [phone], at any time, 24 hours a day, 7 days a week. If you are in danger or need immediate medical attention call 911.  If you would like help to quit smoking, call 1-800-QUIT-NOW 321 101 3561) OR Espaol: 1-855-Djelo-Ya (8-366-294-7654) o para ms informacin haga clic aqu or Text READY to 200-400 to register via text  Mr. Chana Bode,   Please see education materials related to HF provided by  MyChart link.  Patient verbalizes understanding of instructions and care plan provided today and agrees to view in [place]. Active MyChart status and patient understanding of how to access instructions and care plan via MyChart confirmed with patient.     Telephone follow up appointment with Managed Medicaid care management team member scheduled for:03/31/22 @ 1:15pm  [name] RN, BSN [place] RN Care Coordinator   Following is a copy of your plan of care:  Care Plan : RN Care Manager Plan of Care  Updates made by [name], RN since 02/25/2022 12:00 AM     Problem: Health Management needs related to CHF      Long-Range Goal: Development of Plan of Care to address Health Management needs related to CHF   Start Date: 05/16/2021  Expected End Date: 03/20/2022  Priority: High  Note:   Current Barriers:  Chronic Disease Management support and education needs related to CHF Mr. Straka is no longer participating in Cardiac Rehab. He is working some at [employer], getting in lots of walking. He is also cutting back on smoking, now down to less than 1/2 pack a day. He has all of his medications and taking as instructed, reports noticing a change in how he feels. He has been sick with a "head cold" for the last week or so and starting to feel better.  RNCM Clinical Goal(s):  Patient will verbalize understanding of plan for management of CHF as evidenced by patient reports take all medications exactly as prescribed and will call provider for medication related questions as evidenced by patient reports and documentation in EMR    attend all scheduled medical appointments: 03/27/22  with Pharmacy, 04/21/22 with HF Clinic and 06/12/22 with PCP as evidenced by provider documentation in EMR        work with community resource care guide to address needs related to Limited access to food and Level of care concerns as evidenced by patient and/or community resource care  guide support    through collaboration with Consulting civil engineer, provider, and care team.   Interventions: Inter-disciplinary care team collaboration (see longitudinal plan of care) Evaluation of current treatment plan related to  self management and patient's adherence to plan as established by provider  Advised patient to follow up with PCP if he continues to have congestion and feeling bad   Heart Failure Interventions:  (Status: Goal on Track (progressing): YES.)  Long Term Goal -weight today 140.2 Wt Readings from Last 3 Encounters:  02/06/22 145 lb (65.8 kg)  01/23/22 147 lb 3.2 oz (66.8 kg)  01/02/22 144 lb (65.3 kg)   Discussed importance of daily weight and advised patient to weigh and record daily Reviewed role of diuretics in prevention of fluid overload and management of heart failure Discussed the importance of keeping all appointments with provider Provided patient with education about the role of exercise in the management of heart failure Advised patient to discuss any concerns or questions with provider Assessed social determinant of health barriers Reviewed recent provider notes Medications reviewed, advised patient to contact [facility] for refills on albuterol, advair and spiriva  Patient Goals/Self-Care Activities: Take medications as prescribed   Attend all scheduled provider appointments Call pharmacy for medication refills 3-7 days in advance of running out of medications Call provider office for new concerns or questions  use salt in moderation watch for swelling in feet, ankles and legs every day eat more whole grains, fruits and vegetables, lean meats and healthy fats

## 2022-02-25 NOTE — Patient Outreach (Signed)
Medicaid Managed Care   Nurse Care Manager Note  02/25/2022 Name:  [name] MRN:  [id number] DOB:  09-21-1965  [name] is an 57 y.o. year old male who is a primary patient of Cole Rakes, MD.  The [facility] Managed Care Coordination team was consulted for assistance with:    CHF  [name] was given information about Medicaid Managed Care Coordination team services today. [name] Patient agreed to services and verbal consent obtained.  Engaged with patient by telephone for follow up visit in response to provider referral for case management and/or care coordination services.   Assessments/Interventions:  Review of past medical history, allergies, medications, health status, including review of consultants reports, laboratory and other test data, was performed as part of comprehensive evaluation and provision of chronic care management services.  SDOH (Social Determinants of Health) assessments and interventions performed: SDOH Interventions    Flowsheet Row Patient Outreach Telephone from 02/25/2022 in [place] Telephone from 01/02/2022 in [place] [facility] [place] Patient Outreach Telephone from 12/24/2021 in [place] Telephone from 11/20/2021 in [place] [facility] [place] Telephone from 11/14/2021 in [place] and [place] Telephone from 10/30/2021 in [place] and [place]  SDOH Interventions        Food Insecurity Interventions -- -- Intervention Not Indicated -- -- --  Housing Interventions Intervention Not Indicated -- -- -- -- --  Transportation Interventions Payor Benefit Payor Benefit Intervention Not Indicated Payor Benefit -- Other (Comment)  [[employer]]  Utilities Interventions -- -- Intervention Not Indicated -- -- --  Financial Strain Interventions -- -- -- -- Other  (Comment)  [patient care fund to help with phone bill] --       Care Plan  Allergies  Allergen Reactions   Penicillins Other (See Comments)    Blisters  Has patient had a PCN reaction causing immediate rash, facial/tongue/throat swelling, SOB or lightheadedness with hypotension: Yes Has patient had a PCN reaction causing severe rash involving mucus membranes or skin necrosis: Yes Has patient had a PCN reaction that required hospitalization: No Has patient had a PCN reaction occurring within the last 10 years: No If all of the above answers are "NO", then may proceed with Cephalosporin use.     Medications Reviewed Today     Reviewed by [name], RN (Registered Nurse) on 02/25/22 at 1009  Med List Status: <None>   Medication Order Taking? Sig Documenting Provider Last Dose Status Informant  Acetaminophen-guaiFENesin [facility] COLD & FLU PO) [id number] Yes Take by mouth. [provider] Taking Active   albuterol (PROAIR HFA) 108 (90 Base) MCG/ACT inhaler [id number] Yes Inhale 2 puffs into the lungs every 6 (six) hours as needed for shortness of breath. Cole Rakes, MD Taking Active Self  aspirin 81 MG EC tablet [id number] Yes Take 81 mg by mouth in the morning. Swallow whole. [provider] Taking Active Self  atorvastatin (LIPITOR) 40 MG tablet [id number] Yes Take 1 tablet (40 mg total) by mouth daily. Cole Rakes, MD Taking Active Self  dapagliflozin propanediol (FARXIGA) 10 MG TABS tablet [id number] Yes Take 1 tablet (10 mg total) by mouth daily before breakfast. Cole Bihari, FNP Taking Active Self  digoxin (LANOXIN) 0.125 MG tablet [id number] Yes Take 1 tablet (0.125 mg total) by mouth daily. [name], Cole Bo, FNP Taking Active Self  fluticasone-salmeterol (ADVAIR DISKUS) 250-50  MCG/ACT AEPB [id number] Yes Inhale 1 puff into the lungs in the morning and at bedtime. Cole Rakes, MD Taking Active Self           Med Note [name], [name]   Thu Jan 23, 2022 10:19 AM) Only takes as needed, approx. once per day  furosemide (LASIX) 20 MG tablet [id number] Yes Take 2 tablets (40 mg total) every day. Cole Dresser, MD Taking Active   MAGNESIUM OXIDE -MG SUPPLEMENT PO [id number] Yes Take 1 tablet by mouth in the morning. [provider] Taking Active Self  metoprolol succinate (TOPROL-XL) 100 MG 24 hr tablet [id number] Yes Take 1 tablet (100 mg total) by mouth at bedtime. Cole Dresser, MD Taking Active Self  mirtazapine (REMERON SOL-TAB) 15 MG disintegrating tablet [id number] Yes Take 1 tablet (15 mg total) by mouth at bedtime. Cole Rakes, MD Taking Active Self           Med Note [name], [name]   Thu Jan 23, 2022 10:22 AM) Takes 3x/wk  sacubitril-valsartan (ENTRESTO) 49-51 MG [id number] Yes Take 1 tablet by mouth 2 (two) times daily. Cole Jester M, PA-C Taking Active   sodium zirconium cyclosilicate (LOKELMA) 10 g PACK packet [id number] Yes Take 10 g by mouth as needed. [provider] Taking Active            Med Note [name], [name]   Thu Jan 23, 2022 10:23 AM) Taking 1-2x/wk  spironolactone (ALDACTONE) 25 MG tablet [id number] Yes Take 1 tablet (25 mg total) by mouth at bedtime. Cole Dresser, MD Taking Active   tiotropium [facility] HANDIHALER) 18 MCG inhalation capsule [id number] Yes Place 1 capsule (18 mcg total) into inhaler and inhale daily. Cole Rakes, MD Taking Active Self            Patient Active Problem List   Diagnosis Date Noted   Acute combined systolic and diastolic congestive heart failure (HCC)    AKI (acute kidney injury) ([place])    Acute exacerbation of CHF (congestive heart failure) ([place]) 03/05/2021   SVT (supraventricular tachycardia) 06/15/2020   Slurred speech 07/05/2019   ICD (implantable cardioverter-defibrillator) in place 07/20/2018   COPD (chronic obstructive pulmonary disease) ([place]) 12/09/2017   Obstructive sleep apnea 09/08/2016   Autonomic dysfunction 07/15/2015   DJD  (degenerative joint disease), lumbar 06/19/2015   Tremor 05/14/2015   Abnormality of gait 05/14/2015   Neurogenic bladder 04/17/2015   Lumbar strain 03/23/2015   Leg weakness 03/23/2015   Major depression, chronic    History of stroke 02/26/2015   Benign essential HTN    ETOH abuse    Tobacco abuse    Chronic combined systolic and diastolic heart failure ([place])    Alcoholic cardiomyopathy ([place]) 04/26/2014   Smoking greater than 20 pack years 04/19/2014   Polysubstance abuse ([place]) 03/22/2014    Conditions to be addressed/monitored per PCP order:  CHF  Care Plan : RN Care Manager Plan of Care  Updates made by [name], RN since 02/25/2022 12:00 AM     Problem: Health Management needs related to CHF      Long-Range Goal: Development of Plan of Care to address Health Management needs related to CHF   Start Date: 05/16/2021  Expected End Date: 03/20/2022  Priority: High  Note:   Current Barriers:  Chronic Disease Management support and education needs related to CHF Mr. Venard is no longer participating in Cardiac Rehab. He is working some at [employer], getting in lots of walking. He  is also cutting back on smoking, now down to less than 1/2 pack a day. He has all of his medications and taking as instructed, reports noticing a change in how he feels. He has been sick with a "head cold" for the last week or so and starting to feel better.  RNCM Clinical Goal(s):  Patient will verbalize understanding of plan for management of CHF as evidenced by patient reports take all medications exactly as prescribed and will call provider for medication related questions as evidenced by patient reports and documentation in EMR    attend all scheduled medical appointments: 03/27/22 with Pharmacy, 04/21/22 with HF Clinic and 06/12/22 with PCP as evidenced by provider documentation in EMR        work with community resource care guide to address needs related to Limited access to food and Level of  care concerns as evidenced by patient and/or community resource care guide support    through collaboration with Consulting civil engineer, provider, and care team.   Interventions: Inter-disciplinary care team collaboration (see longitudinal plan of care) Evaluation of current treatment plan related to  self management and patient's adherence to plan as established by provider  Advised patient to follow up with PCP if he continues to have congestion and feeling bad   Heart Failure Interventions:  (Status: Goal on Track (progressing): YES.)  Long Term Goal -weight today 140.2 Wt Readings from Last 3 Encounters:  02/06/22 145 lb (65.8 kg)  01/23/22 147 lb 3.2 oz (66.8 kg)  01/02/22 144 lb (65.3 kg)   Discussed importance of daily weight and advised patient to weigh and record daily Reviewed role of diuretics in prevention of fluid overload and management of heart failure Discussed the importance of keeping all appointments with provider Provided patient with education about the role of exercise in the management of heart failure Advised patient to discuss any concerns or questions with provider Assessed social determinant of health barriers Reviewed recent provider notes Medications reviewed, advised patient to contact [facility] for refills on albuterol, advair and spiriva  Patient Goals/Self-Care Activities: Take medications as prescribed   Attend all scheduled provider appointments Call pharmacy for medication refills 3-7 days in advance of running out of medications Call provider office for new concerns or questions  use salt in moderation watch for swelling in feet, ankles and legs every day eat more whole grains, fruits and vegetables, lean meats and healthy fats       Follow Up:  Patient agrees to Care Plan and Follow-up.  Plan: The Managed Medicaid care management team will reach out to the patient again over the next 30 days.  Date/time of next scheduled RN care  management/care coordination outreach:  03/31/22 @ 1:15pm  [name] RN, BSN [place]  [employer] manager

## 2022-02-26 ENCOUNTER — Ambulatory Visit (HOSPITAL_COMMUNITY): Payer: Medicaid Other

## 2022-02-28 ENCOUNTER — Ambulatory Visit (HOSPITAL_COMMUNITY): Payer: Medicaid Other

## 2022-03-03 ENCOUNTER — Telehealth (HOSPITAL_COMMUNITY): Payer: Self-pay

## 2022-03-03 ENCOUNTER — Other Ambulatory Visit (HOSPITAL_COMMUNITY): Payer: Self-pay

## 2022-03-03 NOTE — Progress Notes (Signed)
Paramedicine Encounter    Patient ID: [name], male    DOB: [birth year], 57 y.o.   MRN: [id number]   Complaints- NONE   Assessment- A&Ox4, warm and dry, ambulatory with no shortness of breath, no lower leg edema, no chest pain or dizziness reported.  Compliance with meds- using bubble packs. Needs refills.   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    BP (!) 140/72   Pulse 76   Resp 16   Wt 141 lb (64 kg)   SpO2 96%   BMI 20.52 kg/m  Weight yesterday-didn't weigh Last visit weight-140lbs Weight today- 141lbs  Arrived to meet [name] for paramedicine visit- he agreed to meet at his job at [employer] where we met outside. He reports he has been doing well with no complaints. He denied shortness of breath, ambulated easily with no increased work of breathing on assessment. Vitals obtained. Lungs clear. No lower leg edema noted. He didn't have his meds with him but reports using bubble packs from [employer]. No complaints of any fluid build up lately. He continues to drink beer while watching sports and admitted to drinking a  lot last night while watching the super bowl. He reports no other needs at present. We reviewed upcoming appointments and confirmed same. He agreed to meet at clinic pharmacy visit next month and knows to reach out if he needs anything further. Will seek to graduate from paramedicine.   Visit complete.     [name], [place]  ACTION: Home visit completed    Patient Care Team: [name], MD as PCP - General (Family Medicine) Skeet Latch, MD as PCP - Cardiology (Cardiology) Constance Haw, MD as PCP - Electrophysiology (Cardiology) Skeet Latch, MD as Attending Physician (Cardiology) [name], RN as Case Manager  Patient Active  Problem List   Diagnosis Date Noted   Acute combined systolic and diastolic congestive heart failure ([place])    AKI (acute kidney injury) ([place])    Acute exacerbation of CHF (congestive heart failure) ([place]) 03/05/2021   SVT (supraventricular tachycardia) 06/15/2020   Slurred speech 07/05/2019   ICD (implantable cardioverter-defibrillator) in place 07/20/2018   COPD (chronic obstructive pulmonary disease) ([place]) 12/09/2017   Obstructive sleep apnea 09/08/2016   Autonomic dysfunction 07/15/2015   DJD (degenerative joint disease), lumbar 06/19/2015   Tremor 05/14/2015   Abnormality of gait 05/14/2015   Neurogenic bladder 04/17/2015   Lumbar strain 03/23/2015   Leg weakness 03/23/2015   Major depression, chronic    History of stroke 02/26/2015   Benign essential HTN    ETOH abuse    Tobacco abuse    Chronic combined systolic and diastolic heart failure ([place])    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 Swallow whole., Disp: , Rfl:    atorvastatin (LIPITOR) 40 MG tablet, Take 1 tablet (40  mg total) by mouth daily., Disp: 90 tablet, Rfl: 1   dapagliflozin propanediol (FARXIGA) 10 MG TABS tablet, Take 1 tablet (10 mg total) by mouth daily before breakfast., Disp: 30 tablet, Rfl: 11   digoxin (LANOXIN) 0.125 MG tablet, Take 1 tablet (0.125 mg total) by mouth daily., Disp: 90 tablet, Rfl: 1   fluticasone-salmeterol (ADVAIR DISKUS) 250-50 MCG/ACT AEPB, Inhale 1 puff into the lungs in the morning and at bedtime., Disp: 1 each, Rfl: 3   furosemide (LASIX) 20 MG tablet, Take  2 tablets (40 mg total) every day., Disp: 180 tablet, Rfl: 2   MAGNESIUM OXIDE -MG SUPPLEMENT PO, Take 1 tablet by mouth in the morning., Disp: , Rfl:    metoprolol succinate (TOPROL-XL) 100 MG 24 hr tablet, Take 1 tablet (100 mg total) by mouth at bedtime., Disp: 90 tablet, Rfl: 3   mirtazapine (REMERON SOL-TAB) 15 MG disintegrating tablet, Take 1 tablet (15 mg total) by mouth at bedtime., Disp: 90 tablet, Rfl: 1   sacubitril-valsartan (ENTRESTO) 49-51 MG, Take 1 tablet by mouth 2 (two) times daily., Disp: 60 tablet, Rfl: 8   sodium zirconium cyclosilicate (LOKELMA) 10 g PACK packet, Take 10 g by mouth as needed., Disp: , Rfl:    spironolactone (ALDACTONE) 25 MG tablet, Take 1 tablet (25 mg total) by mouth at bedtime., Disp: 90 tablet, Rfl: 3   tiotropium (SPIRIVA HANDIHALER) 18 MCG inhalation capsule, Place 1 capsule (18 mcg total) into inhaler and inhale daily., Disp: 30 capsule, Rfl: 6 Allergies  Allergen Reactions   Penicillins Other (See Comments)    Blisters  Has patient had a PCN reaction causing immediate rash, facial/tongue/throat swelling, SOB or lightheadedness with hypotension: Yes Has patient had a PCN reaction causing severe rash involving mucus membranes or skin necrosis: Yes Has patient had a PCN reaction that required hospitalization: No Has patient had a PCN reaction occurring within the last 10 years: No If all of the above answers are "NO", then may proceed with Cephalosporin use.      Social History   Socioeconomic History   Marital status: Single    Spouse name: Not on file   Number of children: 1   Years of education: 12   Highest education level: Not on file  Occupational History   Not on file  Tobacco Use   Smoking status: Every Day    Packs/day: 0.50    Years: 20.00    Total pack years: 10.00    Types: Cigarettes   Smokeless tobacco: Never   Tobacco comments:    02/25/22 down to 1/2 pack a day  Vaping Use   Vaping Use: Never used  Substance and  Sexual Activity   Alcohol use: Yes    Alcohol/week: 1.0 standard drink of alcohol    Types: 1 Cans of beer per week    Comment: socially    Drug use: Not Currently    Types: Cocaine    Comment: stopped using cocaine 11/19/14   Sexual activity: Not on file  Other Topics Concern   Not on file  Social History Narrative   Lives alone, nurse comes 2 hrs daily   Social Determinants of Health   Financial Resource Strain: High Risk (11/14/2021)   Overall Financial Resource Strain (CARDIA)    Difficulty of Paying Living Expenses: Hard  Food Insecurity: Food Insecurity Present (12/24/2021)   Hunger Vital Sign    Worried About Running Out of Food in the Last Year: Sometimes true    Ran Out  of Food in the Last Year: Sometimes true  Transportation Needs: Unmet Transportation Needs (02/25/2022)   PRAPARE - Hydrologist (Medical): Yes    Lack of Transportation (Non-Medical): Yes  Physical Activity: Not on file  Stress: Not on file  Social Connections: Not on file  Intimate Partner Violence: Not on file    Physical Exam      Future Appointments  Date Time Provider [place]  03/17/2022  7:25 AM [facility] DEVICE REMOTES [facility]  03/27/2022 11:00 AM [facility] PHARMACY [facility] None  03/31/2022  1:15 PM [name], RN [facility] None  04/21/2022  3:30 PM [facility] PA/NP [facility] None  05/12/2022  7:25 AM [facility] DEVICE REMOTES [facility]  06/12/2022 10:30 AM [name], MD [facility] None  08/11/2022  7:25 AM [facility] DEVICE REMOTES [facility]  11/10/2022  7:25 AM [facility] DEVICE REMOTES [facility]  02/09/2023  7:25 AM [facility] DEVICE REMOTES [facility]

## 2022-03-03 NOTE — Telephone Encounter (Signed)
Spoke to [name] who reports he is at work and agreed for me to come by there today for a paramedicine visit. He did report he is needing refills on his bubble packs. I will send message to [facility] for same, he requested they be sent to [facility]. He denied any complaints at present but states he has been working M-F and feeling fine. I will follow up today around 2:00. He agreed with plan. Call complete.   [name], [place] 03/03/2022

## 2022-03-17 ENCOUNTER — Ambulatory Visit: Payer: Medicaid Other | Attending: Cardiology

## 2022-03-17 DIAGNOSIS — Z95810 Presence of automatic (implantable) cardiac defibrillator: Secondary | ICD-10-CM

## 2022-03-17 DIAGNOSIS — I5022 Chronic systolic (congestive) heart failure: Secondary | ICD-10-CM | POA: Diagnosis not present

## 2022-03-18 ENCOUNTER — Ambulatory Visit: Payer: Medicaid Other

## 2022-03-18 DIAGNOSIS — I428 Other cardiomyopathies: Secondary | ICD-10-CM

## 2022-03-18 LAB — CUP PACEART REMOTE DEVICE CHECK
Date Time Interrogation Session: 20240227092435
Implantable Lead Connection Status: 753985
Implantable Lead Implant Date: 20191230
Implantable Lead Location: 753860
Implantable Lead Model: 7122
Implantable Pulse Generator Implant Date: 20191230
Pulse Gen Serial Number: 9850980

## 2022-03-19 NOTE — Progress Notes (Signed)
EPIC Encounter for ICM Monitoring  Patient Name: [name] is a 57 y.o. male Date: 03/19/2022 Primary Care Physican: [name], MD Primary Cardiologist: Aundra Dubin Electrophysiologist: Curt Bears 12/02/2021 Weight: 145 lbs (baseline 140 lbs per patient)     12/03/2021 Weight: 147.8 lbs  12/09/2021 Weight: 140 lbs (baseline) 02/11/2022 Weight: 144 lbs (baseline 140-141 lbs) 02/17/2022 Weight: 142 lbs 03/19/2022 Weight: 147 lbs                                                             Spoke with patient and heart failure questions reviewed.  Transmission results reviewed.  Pt asymptomatic for fluid accumulation.  Reports feeling well at this time and voices no complaints.     Diet:  Does not follow low salt diet   CorVue thoracic impedance suggesting fluid levels    Prescribed:  Furosemide 20 mg take 2 tablets (40 mg total) by mouth daily (increased 12/26/21).  Spironolactone 25 mg take 0.5 tablet by mouth at bedtime   Labs:  Pt being followed by [name], EMT paramedicine program.   01/07/2022 Creatinine 1.24, BUN 24, Potassium 4.3, Sodium 136, GFR >60 01/02/2022 Creatinine 1.19, BUN 21, Potassium 4.0, Sodium 135, GFR >60 12/23/2021 Creatinine 1.19, BUN 21, Potassium 4.0, Sodium 135, GFR >60 11/22/2021 Creatinine 1.40, BUN 29, Potassium 4.3, Sodium 137, GFR 59  11/01/2021 Creatinine 1.07, BUN 11, Potassium 3.6, Sodium 137, GFR >60  10/18/2021 Creatinine 0.88, BUN 12, Potassium 3.8, Sodium 137, GFR >60  10/10/2021 Creatinine 1.53, BUN 38, Potassium 4.8, Sodium 133, GFR 53  A complete set of results can be found in Results Review.   Recommendations:  No changes and encouraged to call if experiencing any fluid symptoms.   Follow-up plan: ICM clinic phone appointment on 04/21/2022.    91 day device clinic remote transmission 05/12/2022.     EP/Cardiology Office Visits: 04/21/2022 with HF Clinic.     Copy of ICM check sent to Dr. Curt Bears.     3 month ICM trend:  03/17/2022.    12-14 Month ICM trend:     Rosalene Billings, RN 03/19/2022 4:19 PM

## 2022-03-26 ENCOUNTER — Telehealth (HOSPITAL_COMMUNITY): Payer: Self-pay

## 2022-03-26 NOTE — Progress Notes (Incomplete)
***In Progress***    Advanced Heart Failure Clinic Note   PCP: [name], MD HF Cardiology: Dr. Aundra Dubin  HPI:  57 y.o. with history of nonischemic cardiomyopathy was referred by [name] for evaluation of CHF.  Cardiomyopathy was diagnosed in 2016 when echo showed EF 20%.  He has a history of cocaine use and ETOH abuse. Currently, drinking about 3 beers/day. He is smoking. No recent cocaine.  He had a stroke in 2017 in setting of uncontrolled HTN and cocaine abuse. He has a Research officer, political party ICD. 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 Echo in 02/2021 showed EF < 20%, severe LV dilation, moderately decreased RV systolic function with mild RV enlargement. He has 13 siblings, none of whom have known cardiac disease. His grandmother had CHF and there apparently was conversation about a heart transplant but she passed away.    Follow up 08-31-21 with Dr. Aundra Dubin, NYHA II-III, volume stable. CPX, cMRI and evaluation for BAT arranged.    CPX (09/2021) showed severe functional limitation due to HF and deconditioning.    Follow up 09/2021, NYHA III-IIIb , labs showed AKI and GDMT held. Required a dose of Lokelma for hyperkalemia.   Cardiac MRI 10/2021 (difficult images due to ICD artifact) LVEF 24%, RVEF 34%.   He presented on 02/06/22 for f/u and titration of meds. Doing well. Said he feels significantly better. Breathing much improved. Less fatigue. NYHA class I-II. No LEE. Improved compliance w/ meds. Followed by paramedicine. Had bubble pill packs. Still smoking ~1/4 ppd. Tried chantix but unable to tolerate due to nightmares. Still drinking beer 3-6 per sitting, mainly on weekends.    Today he returns to HF clinic for pharmacist medication titration. At last visit with APP Entresto was increased to 49/51 mg BID.    Shortness of breath/dyspnea on exertion? {YES NO:22349}  Orthopnea/PND? {YES NO:22349} Edema? {YES NO:22349} Lightheadedness/dizziness? {YES NO:22349} Daily weights at  home? {YES NO:22349} Blood pressure/heart rate monitoring at home? {YES [id number] Following low-sodium/fluid-restricted diet? {YES NO:22349}  HF Medications: Metoprolol succinate 100 mg daily. Entresto 49/51 mg BID Spironolactone 25 mg daily. Farxiga 10 mg daily. Digoxin 0.125 mg daily. Lasix 40 mg daily  Has the patient been experiencing any side effects to the medications prescribed?  {YES NO:22349}  Does the patient have any problems obtaining medications due to transportation or finances? [place] Medicaid Healthy Blue   Understanding of regimen: {excellent/good/fair/poor:19665} Understanding of indications: {excellent/good/fair/poor:19665} Potential of compliance: {excellent/good/fair/poor:19665} Patient understands to avoid NSAIDs. Patient understands to avoid decongestants.    Pertinent Lab Values: 02/06/22: Serum creatinine 0.98, BUN 13, Potassium 3.5, Sodium 136, Digoxin 0.3   Vital Signs: Weight: *** (last clinic weight: 145 lbs) Blood pressure: ***  Heart rate: ***   Assessment/Plan: 1. Chronic systolic CHF: Nonischemic cardiomyopathy.  Coronary CTA in 06/2021 with mild nonobstructive CAD.  Cause of CMP uncertain => prior cocaine abuse but not currently.  He drinks about 3 beers/day, denies heavier drinking in past but apparently there was concern that he had had ETOH withdrawal seizures in the past. His grandmother had CHF but apparently none of his 13 siblings have known cardiac disease. EF has been low since at least 2016. Echo in 02/2021 showed EF < 20%, severe LV dilation, moderately decreased RV systolic function with mild RV enlargement. He has a Research officer, political party ICD.  CPX 09/2021 showed severe functional limitation from both HF and deconditioning. Cardiac MRI 1020/23, technically difficult study due to ICD artifact, LVEF 24%, no evidence of infiltrative  disease.  - NYHA class I-II symptoms. Euvolemic on exam. ***  - Continue Lasix 40 mg daily - Continue metoprolol succinate 100 mg  daily. -  Entresto to 49-51 mg bid  - Continue spironolactone 25 mg daily. - Continue Farxiga 10 mg daily. - Continue digoxin 0.125 mg daily. - Encouraged to cut back on ETOH. - Bifurcation too high for [employer], working on getting Medicaid approval for commercial baroreceptor activation therapy.  - We briefly discussed advanced therapies. Not transplant candidate with on-going tobacco use.  Would likely be VAD candidate if needed.   2. COPD: He has cut back but is still smoking.  Failed nicotine patches. Failed Chantix (stopped due to nightmares)  Follow up with APP on 04/21/22.  Sinda Du, PharmD Candidate  Audry Riles, PharmD, BCPS, BCCP, CPP Heart Failure Clinic Pharmacist [phone]

## 2022-03-26 NOTE — Telephone Encounter (Signed)
Called [name] to remind him of his upcoming appointment in the HF clinic with Pharmacy Team at 11:00 tomorrow. He agreed with plan and knows to bring all medications when he comes- I will plan to meet him there. Call complete.   [name], [place] 03/26/2022

## 2022-03-27 ENCOUNTER — Other Ambulatory Visit (HOSPITAL_COMMUNITY): Payer: Self-pay

## 2022-03-27 ENCOUNTER — Ambulatory Visit (HOSPITAL_COMMUNITY)
Admission: RE | Admit: 2022-03-27 | Discharge: 2022-03-27 | Disposition: A | Discharge status: Home / Self Care | Payer: Medicaid Other | Source: Ambulatory Visit | Attending: Internal Medicine | Admitting: Internal Medicine

## 2022-03-27 VITALS — BP 124/82 | HR 52 | Wt 138.8 lb

## 2022-03-27 DIAGNOSIS — F17210 Nicotine dependence, cigarettes, uncomplicated: Secondary | ICD-10-CM | POA: Insufficient documentation

## 2022-03-27 DIAGNOSIS — Z95810 Presence of automatic (implantable) cardiac defibrillator: Secondary | ICD-10-CM | POA: Insufficient documentation

## 2022-03-27 DIAGNOSIS — Z8673 Personal history of transient ischemic attack (TIA), and cerebral infarction without residual deficits: Secondary | ICD-10-CM | POA: Insufficient documentation

## 2022-03-27 DIAGNOSIS — I2510 Atherosclerotic heart disease of native coronary artery without angina pectoris: Secondary | ICD-10-CM | POA: Diagnosis not present

## 2022-03-27 DIAGNOSIS — I5022 Chronic systolic (congestive) heart failure: Secondary | ICD-10-CM | POA: Insufficient documentation

## 2022-03-27 DIAGNOSIS — Z79899 Other long term (current) drug therapy: Secondary | ICD-10-CM | POA: Diagnosis not present

## 2022-03-27 DIAGNOSIS — Z8249 Family history of ischemic heart disease and other diseases of the circulatory system: Secondary | ICD-10-CM | POA: Diagnosis not present

## 2022-03-27 DIAGNOSIS — I428 Other cardiomyopathies: Secondary | ICD-10-CM | POA: Diagnosis not present

## 2022-03-27 LAB — BASIC METABOLIC PANEL
Anion gap: 10 (ref 5–15)
BUN: 11 mg/dL (ref 6–20)
CO2: 25 mmol/L (ref 22–32)
Calcium: 9.6 mg/dL (ref 8.9–10.3)
Chloride: 99 mmol/L (ref 98–111)
Creatinine, Ser: 0.96 mg/dL (ref 0.61–1.24)
GFR, Estimated: >60 mL/min (ref >60)
Glucose, Bld: 96 mg/dL (ref 70–99)
Potassium: 3.6 mmol/L (ref 3.5–5.1)
Sodium: 134 mmol/L — ABNORMAL LOW (ref 135–145)

## 2022-03-27 MED ORDER — ENTRESTO 97-103 MG PO TABS: 1.0000 | ORAL_TABLET | Freq: Two times a day (BID) | ORAL | 11 refills | Status: DC

## 2022-03-27 NOTE — Progress Notes (Signed)
Paramedicine Encounter    Patient ID: [name], male    DOB: 1965/08/04, 57 y.o.   MRN: [id number]  Met with [name] in clinic today where he was seen by Pharmacy team. He reports feeling well, no complaints. 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 He is compliant with his medications using his bubble packs provided by [employer].    Today Entresto was increased to 97-'103mg'$ .  [name] was informed to take bubble packs to [place] to have them repackage with updated dose as he was on 49-'51mg'$ . [name] agreed to plan.   Lab stable and plans confirmed to continue Entresto increase.   [name] and I plan to meet next week at his job for a vitals check to see how he is responding to [facility] increase. He agreed.   Visit complete.   [name], [place] 03/27/2022    Patient Care Team: [name], MD as PCP - General (Family Medicine) Skeet Latch, MD as PCP - Cardiology (Cardiology) Constance Haw, MD as PCP - Electrophysiology (Cardiology) Skeet Latch, MD as Attending Physician (Cardiology) [name], RN as Case Manager  Patient Active Problem List   Diagnosis Date Noted   Acute combined systolic and diastolic congestive heart failure ([place])    AKI (acute kidney injury) ([place])    Acute exacerbation of CHF (congestive heart failure) ([place]) 03/05/2021   SVT (supraventricular tachycardia) 06/15/2020   Slurred speech 07/05/2019   ICD (implantable cardioverter-defibrillator) in place 07/20/2018   COPD (chronic obstructive pulmonary disease) ([place]) 12/09/2017   Obstructive sleep apnea 09/08/2016   Autonomic dysfunction 07/15/2015   DJD (degenerative joint disease), lumbar 06/19/2015   Tremor 05/14/2015   Abnormality of gait 05/14/2015   Neurogenic bladder 04/17/2015   Lumbar strain 03/23/2015   Leg weakness  03/23/2015   Major depression, chronic    History of stroke 02/26/2015   Benign essential HTN    ETOH abuse    Tobacco abuse    Chronic combined systolic and diastolic heart failure ([place])    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 Swallow whole., Disp: , Rfl:    atorvastatin (LIPITOR) 40 MG tablet, Take 1 tablet (40 mg total) by mouth daily., Disp: 90 tablet, Rfl: 1   dapagliflozin propanediol (FARXIGA) 10 MG TABS tablet, Take 1 tablet (10 mg total) by mouth daily before breakfast., Disp: 30 tablet, Rfl: 11   digoxin (LANOXIN) 0.125 MG tablet, Take 1 tablet (0.125 mg total) by mouth daily., Disp: 90 tablet, Rfl: 1   fluticasone-salmeterol (ADVAIR DISKUS) 250-50 MCG/ACT AEPB, Inhale 1 puff into the lungs in the morning and at bedtime., Disp: 1 each, Rfl: 3   furosemide (LASIX) 20 MG tablet, Take 2 tablets (40 mg total) every day., Disp: 180 tablet, Rfl: 2   MAGNESIUM OXIDE -MG SUPPLEMENT PO, Take 1 tablet by mouth in the morning., Disp: , Rfl:    metoprolol succinate (TOPROL-XL) 100 MG  24 hr tablet, Take 1 tablet (100 mg total) by mouth at bedtime., Disp: 90 tablet, Rfl: 3   mirtazapine (REMERON SOL-TAB) 15 MG disintegrating tablet, Take 1 tablet (15 mg total) by mouth at bedtime., Disp: 90 tablet, Rfl: 1   sacubitril-valsartan (ENTRESTO) 97-103 MG, Take 1 tablet by mouth 2 (two) times daily., Disp: 60 tablet, Rfl: 11   sodium zirconium cyclosilicate (LOKELMA) 10 g PACK packet, Take 10 g by mouth as needed., Disp: , Rfl:    spironolactone (ALDACTONE) 25 MG tablet,  Take 1 tablet (25 mg total) by mouth at bedtime., Disp: 90 tablet, Rfl: 3   tiotropium (SPIRIVA HANDIHALER) 18 MCG inhalation capsule, Place 1 capsule (18 mcg total) into inhaler and inhale daily., Disp: 30 capsule, Rfl: 6 Allergies  Allergen Reactions   Penicillins Other (See Comments)    Blisters  Has patient had a PCN reaction causing immediate rash, facial/tongue/throat swelling, SOB or lightheadedness with hypotension: Yes Has patient had a PCN reaction causing severe rash involving mucus membranes or skin necrosis: Yes Has patient had a PCN reaction that required hospitalization: No Has patient had a PCN reaction occurring within the last 10 years: No If all of the above answers are "NO", then may proceed with Cephalosporin use.      Social History   Socioeconomic History   Marital status: Single    Spouse name: Not on file   Number of children: 1   Years of education: 12   Highest education level: Not on file  Occupational History   Not on file  Tobacco Use   Smoking status: Every Day    Packs/day: 0.50    Years: 20.00    Total pack years: 10.00    Types: Cigarettes   Smokeless tobacco: Never   Tobacco comments:    02/25/22 down to 1/2 pack a day  Vaping Use   Vaping Use: Never used  Substance and Sexual Activity   Alcohol use: Yes    Alcohol/week: 1.0 standard drink of alcohol    Types: 1 Cans of beer per week    Comment: socially    Drug use: Not Currently    Types: Cocaine    Comment: stopped using cocaine 11/19/14   Sexual activity: Not on file  Other Topics Concern   Not on file  Social History Narrative   Lives alone, nurse comes 2 hrs daily   Social Determinants of Health   Financial Resource Strain: High Risk (11/14/2021)   Overall Financial Resource Strain (CARDIA)    Difficulty of Paying Living Expenses: Hard  Food Insecurity: Food Insecurity Present (12/24/2021)   Hunger Vital Sign    Worried About Running Out of Food in the Last Year: Sometimes  true    Ran Out of Food in the Last Year: Sometimes true  Transportation Needs: Unmet Transportation Needs (02/25/2022)   PRAPARE - Hydrologist (Medical): Yes    Lack of Transportation (Non-Medical): Yes  Physical Activity: Not on file  Stress: Not on file  Social Connections: Not on file  Intimate Partner Violence: Not on file    Physical Exam      Future Appointments  Date Time Provider [place]  03/31/2022  1:15 PM [name], RN [facility] None  04/18/2022  9:25 AM [facility] DEVICE REMOTES [facility]  04/21/2022  7:10 AM [facility] DEVICE REMOTES [facility]  04/21/2022  3:30 PM [facility] PA/NP [facility] None  05/12/2022  7:25 AM [facility] DEVICE REMOTES [facility]  [facility]  06/12/2022 10:30 AM [name], MD [facility] None  06/19/2022  9:25 AM [facility] DEVICE REMOTES [facility]  08/11/2022  7:25 AM [facility] DEVICE REMOTES [facility]  11/10/2022  7:25 AM [facility] DEVICE REMOTES [facility]  02/09/2023  7:25 AM [facility] DEVICE REMOTES [facility]     ACTION: Home visit completed

## 2022-03-27 NOTE — Patient Instructions (Signed)
It was a pleasure seeing you today!  MEDICATIONS: -We are changing your medications today -Increase Entresto to 97/103 mg ( 1 tablet) twice daily -Call if you have questions about your medications.  LABS: -We will call you if your labs need attention.  NEXT APPOINTMENT: Return to clinic in 4 weeks with APP Clinic.  In general, to take care of your heart failure: -Limit your fluid intake to 2 Liters (half-gallon) per day.   -Limit your salt intake to ideally 2-3 grams (2000-3000 mg) per day. -Weigh yourself daily and record, and bring that "weight diary" to your next appointment.  (Weight gain of 2-3 pounds in 1 day typically means fluid weight.) -The medications for your heart are to help your heart and help you live longer.   -Please contact us before stopping any of your heart medications.  Call the clinic at [phone] with questions or to reschedule future appointments.

## 2022-03-27 NOTE — Research (Signed)
[facility] Informed Consent   Subject Name: [name]  Subject met inclusion and exclusion criteria.  The informed consent form, study requirements and expectations were reviewed with the subject and questions and concerns were addressed prior to the signing of the consent form.  The subject verbalized understanding of the trial requirements.  The subject agreed to participate in the [facility] trial and signed the informed consent on 09/18/2021.  The informed consent was obtained prior to performance of any protocol-specific procedures for the subject.  A copy of the signed informed consent was given to the subject and a copy was placed in the subject's medical record.   Philemon Kingdom D

## 2022-03-27 NOTE — Progress Notes (Signed)
Advanced Heart Failure Clinic Note   PCP: Cole Rakes, MD HF Cardiology: Dr. Aundra Dubin  HPI:  57 y.o. with history of nonischemic cardiomyopathy was referred by [name] for evaluation of CHF.  Cardiomyopathy was diagnosed in 2016 when echo showed EF 20%.  He has a history of cocaine use and ETOH abuse. Currently, drinking about 3 beers/day. He is smoking. No recent cocaine. He had a stroke in 2017 in setting of uncontrolled HTN and cocaine abuse. He has a Research officer, political party ICD. 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 Echo in 02/2021 showed EF < 20%, severe LV dilation, moderately decreased RV systolic function with mild RV enlargement. He has 13 siblings, none of whom have known cardiac disease. His grandmother had CHF and there apparently was conversation about a heart transplant but she passed away.    Follow up 09-08-21 with Dr. Aundra Dubin, NYHA II-III, volume stable. CPX, cMRI and evaluation for BAT arranged.    CPX (09/2021) showed severe functional limitation due to HF and deconditioning.    Follow up 09/2021, NYHA III-IIIb , labs showed AKI and GDMT held. Required a dose of Lokelma for hyperkalemia.   Cardiac MRI 10/2021 (difficult images due to ICD artifact) LVEF 24%, RVEF 34%.  He presented on 02/06/22 for f/u and titration of meds. Was doing well and said he felt significantly better. Breathing much improved. Less fatigue. NYHA class I-II. No LEE. Improved compliance w/ meds. Followed by paramedicine. Had bubble pill packs. Still smoking ~1/4 ppd. Tried chantix but unable to tolerate due to nightmares. Was still drinking beer 3-6 per sitting, mainly on weekends.   Today he returns to HF clinic for pharmacist medication titration. At last visit with APP, [name] was increased to 49/51 mg BID. Overall reports feeling excellent. Denies dizziness, lightheadedness, fatigue, and chest pain. Does feel as if his heart "flutters" once every 2 weeks. Denies shortness of breath and  dyspnea on exertion. Is able to walk all day while working at [employer]. Home weight usually around 134-135 lbs. Has not required extra doses of Lasix. Is euvolemic on exam and denies PND/orthopnea. Appetite is good but is not following low-salt diet. Home SBP around 115, however feels as if home cuff is not calibrated. Heather willing to check calibration at next visit with him in about a week.   HF Medications: Metoprolol succinate 100 mg daily Entresto 49/51 mg BID Spironolactone 25 mg daily Farxiga 10 mg daily Digoxin 0.125 mg daily Lasix 40 mg daily  Has the patient been experiencing any side effects to the medications prescribed?  NO  Does the patient have any problems obtaining medications due to transportation or finances? [place] Medicaid Healthy Blue   Understanding of regimen: excellent Understanding of indications: excellent Potential of compliance: excellent Patient understands to avoid NSAIDs. Patient understands to avoid decongestants.  Pertinent Lab Values: 02/06/22: Serum creatinine 0.98, BUN 13, Potassium 3.5, Sodium 136, Digoxin 0.3 BMET today pending   Vital Signs: Weight: 138 lbs (last clinic weight: 145 lbs) Blood pressure: 124/82 mmHg Heart rate: 52   Assessment/Plan: 1. Chronic systolic CHF: Nonischemic cardiomyopathy.  Coronary CTA in 06/2021 with mild nonobstructive CAD. Cause of CMP uncertain => prior cocaine abuse but not currently. He drinks about 3 beers/day, denies heavier drinking in past but apparently there was concern that he had had ETOH withdrawal seizures in the past. His grandmother had CHF but apparently none of his 13 siblings have known cardiac disease. EF has been low since  at least 2016. Echo in 02/2021 showed EF < 20%, severe LV dilation, moderately decreased RV systolic function with mild RV enlargement. He has a Research officer, political party ICD. CPX 09/2021 showed severe functional limitation from both HF and deconditioning. Cardiac MRI 10/2021, technically  difficult study due to ICD artifact, LVEF 24%, no evidence of infiltrative disease.  - NYHA class I-II symptoms. Euvolemic on exam today. - Obtain BMET today.  - Continue Lasix 40 mg daily - Continue metoprolol succinate 100 mg daily. - Increase Entresto to 97/103 mg BID  - Continue spironolactone 25 mg daily. - Continue Farxiga 10 mg daily. - Continue digoxin 0.125 mg daily. - Bifurcation too high for [employer], working on getting Medicaid approval for commercial baroreceptor activation therapy.  - Per Dr. Aundra Dubin, not transplant candidate with on-going tobacco use.  Would likely be VAD candidate if needed.   2. COPD: He has cut back but is still smoking. Failed nicotine patches. Failed Chantix (stopped due to nightmares)  Follow up with APP on 04/21/22.  Sinda Du, PharmD Candidate  Audry Riles, PharmD, BCPS, BCCP, CPP Heart Failure Clinic Pharmacist [phone]

## 2022-03-28 NOTE — Progress Notes (Signed)
Remote ICD transmission.   

## 2022-03-31 ENCOUNTER — Other Ambulatory Visit: Payer: Medicaid Other | Admitting: *Deleted

## 2022-03-31 NOTE — Patient Instructions (Signed)
Visit Information  [name] was given information about Medicaid Managed Care team care coordination services as a part of their Healthy [facility] Medicaid benefit. [name] verbally consented to engagement with the [facility] Managed Care team.   If you are experiencing a medical emergency, please call 911 or report to your local emergency department or urgent care.   If you have a non-emergency medical problem during routine business hours, please contact your provider's office and ask to speak with a nurse.   For questions related to your Healthy [facility] health plan, please call: [phone] or visit the homepage here: [email or web address]  If you would like to schedule transportation through your Healthy [facility] plan, please call the following number at least 2 days in advance of your appointment: [phone]  For information about your ride after you set it up, call Ride Assist at [phone]. Use this number to activate a Will Call pickup, or if your transportation is late for a scheduled pickup. Use this number, too, if you need to make a change or cancel a previously scheduled reservation.  If you need transportation services right away, call [phone]. The after-hours call center is staffed 24 hours to handle ride assistance and urgent reservation requests (including discharges) 365 days a year. Urgent trips include sick visits, hospital discharge requests and life-sustaining treatment.  Call the [name] at [phone], at any time, 24 hours a day, 7 days a week. If you are in danger or need immediate medical attention call 911.  If you would like help to quit smoking, call 1-800-QUIT-NOW (424) 553-8009) OR Espaol: 1-855-Djelo-Ya HD:1601594) o para ms informacin haga clic aqu or Text READY to 200-400 to register via text  [name],   Patient verbalizes understanding of instructions and care  plan provided today and agrees to view in [place]. Active MyChart status and patient understanding of how to access instructions and care plan via MyChart confirmed with patient.     No further follow up required:    Cole Joiner RN, [place] RN Care Coordinator [phone]  Following is a copy of your plan of care:  Care Plan : [place] of Care  Updates made by Cole Montane, RN since 03/31/2022 12:00 AM  Completed 03/31/2022   Problem: Health Management needs related to CHF Resolved 03/31/2022     Long-Range Goal: Development of Plan of Care to address Health Management needs related to CHF Completed 03/31/2022  Start Date: 05/16/2021  Expected End Date: 03/20/2022  Priority: High  Note:   Current Barriers:  Chronic Disease Management support and education needs related to CHF [name] is no longer participating in Cardiac Rehab. He is working some at [employer], getting in lots of walking. [name] reports feeling good. He is working with [employer] and reports having and taking all of his medications.  RNCM Clinical Goal(s):  Patient will verbalize understanding of plan for management of CHF as evidenced by patient reports take all medications exactly as prescribed and will call provider for medication related questions as evidenced by patient reports and documentation in EMR    attend all scheduled medical appointments: 03/27/22 with Pharmacy, 04/21/22 with HF Clinic and 06/12/22 with PCP as evidenced by provider documentation in EMR        work with community resource care guide to address needs related to Limited access to food and Level of care concerns as evidenced by patient and/or community resource  care guide support    through collaboration with RN Care manager, provider, and care team.   Interventions: Inter-disciplinary care team collaboration (see longitudinal plan of care) Evaluation of current treatment plan related  to  self management and patient's adherence to plan as established by provider  RNCM discussed closing this case, advised [name] to reach out to [facility] should any new needs arise   Heart Failure Interventions:  (Status: Goal Met.)  Long Term Goal  Wt Readings from Last 3 Encounters:  02/06/22 145 lb (65.8 kg)  01/23/22 147 lb 3.2 oz (66.8 kg)  01/02/22 144 lb (65.3 kg)   Discussed the importance of keeping all appointments with provider Advised patient to discuss any concerns or questions with provider Assessed social determinant of health barriers Reviewed upcoming appointments with HF Clinic on 04/21/22 and PCP on 06/12/22 Reviewed recent provider notes  Patient Goals/Self-Care Activities: Take medications as prescribed   Attend all scheduled provider appointments Call pharmacy for medication refills 3-7 days in advance of running out of medications Call provider office for new concerns or questions  use salt in moderation watch for swelling in feet, ankles and legs every day eat more whole grains, fruits and vegetables, lean meats and healthy fats

## 2022-03-31 NOTE — Patient Outreach (Signed)
Medicaid Managed Care   Nurse Care Manager Note  03/31/2022 Name:  [name] MRN:  [id number] DOB:  07/02/1965  [name] is an 57 y.o. year old male who is a primary patient of Cole Rakes, MD.  The [facility] Managed Care Coordination team was consulted for assistance with:    CHF  [name] was given information about Medicaid Managed Care Coordination team services today. [name] Patient agreed to services and verbal consent obtained.  Engaged with patient by telephone for follow up visit in response to provider referral for case management and/or care coordination services.   Assessments/Interventions:  Review of past medical history, allergies, medications, health status, including review of consultants reports, laboratory and other test data, was performed as part of comprehensive evaluation and provision of chronic care management services.  SDOH (Social Determinants of Health) assessments and interventions performed: SDOH Interventions    Flowsheet Row Patient Outreach Telephone from 02/25/2022 in [place] Telephone from 01/02/2022 in [place] and [facility] [place] Patient Outreach Telephone from 12/24/2021 in [place] Telephone from 11/20/2021 in [place] and [place] Telephone from 11/14/2021 in [place] and [place] Telephone from 10/30/2021 in [place] and [place]  SDOH Interventions        Food Insecurity Interventions -- -- Intervention Not Indicated -- -- --  Housing Interventions Intervention Not Indicated -- -- -- -- --  Transportation Interventions Payor Benefit Payor Benefit Intervention Not Indicated Payor Benefit -- Other (Comment)  [kaisen [employer]er]  Utilities Interventions -- -- Intervention Not Indicated -- -- --  Financial Strain Interventions -- -- -- -- Other  (Comment)  [patient care fund to help with phone bill] --       Care Plan  Allergies  Allergen Reactions   Penicillins Other (See Comments)    Blisters  Has patient had a PCN reaction causing immediate rash, facial/tongue/throat swelling, SOB or lightheadedness with hypotension: Yes Has patient had a PCN reaction causing severe rash involving mucus membranes or skin necrosis: Yes Has patient had a PCN reaction that required hospitalization: No Has patient had a PCN reaction occurring within the last 10 years: No If all of the above answers are "NO", then may proceed with Cephalosporin use.     Medications Reviewed Today     Reviewed by [name], Paramedic (Certified Medical Assistant) on 03/03/22 at 1705  Med List Status: <None>   Medication Order Taking? Sig Documenting Provider Last Dose Status Informant  Acetaminophen-guaiFENesin [facility] COLD & FLU PO) [id number] Yes Take by mouth. [provider] Taking Active   albuterol (PROAIR HFA) 108 (90 Base) MCG/ACT inhaler [id number] Yes Inhale 2 puffs into the lungs every 6 (six) hours as needed for shortness of breath. Cole Rakes, MD Taking Active Self  aspirin 81 MG EC tablet [id number] Yes Take 81 mg by mouth in the morning. Swallow whole. [provider] Taking Active Self  atorvastatin (LIPITOR) 40 MG tablet [id number] Yes Take 1 tablet (40 mg total) by mouth daily. Cole Rakes, MD Taking Active Self  dapagliflozin propanediol (FARXIGA) 10 MG TABS tablet [id number] Yes Take 1 tablet (10 mg total) by mouth daily before breakfast. Cole Bihari, FNP Taking Active Self  digoxin (LANOXIN) 0.125 MG tablet [id number] Yes Take 1 tablet (0.125 mg total) by mouth daily. [name], Maricela Bo, FNP Taking Active Self  fluticasone-salmeterol (ADVAIR DISKUS) 250-50  MCG/ACT AEPB [id number] Yes Inhale 1 puff into the lungs in the morning and at bedtime. Cole Rakes, MD Taking Active Self           Med Note  [name], [name]   Thu Jan 23, 2022 10:19 AM) Only takes as needed, approx. once per day  furosemide (LASIX) 20 MG tablet [id number] Yes Take 2 tablets (40 mg total) every day. Cole Dresser, MD Taking Active   MAGNESIUM OXIDE -MG SUPPLEMENT PO [id number] Yes Take 1 tablet by mouth in the morning. [provider] Taking Active Self  metoprolol succinate (TOPROL-XL) 100 MG 24 hr tablet [id number] Yes Take 1 tablet (100 mg total) by mouth at bedtime. Cole Dresser, MD Taking Active Self  mirtazapine (REMERON SOL-TAB) 15 MG disintegrating tablet [id number] Yes Take 1 tablet (15 mg total) by mouth at bedtime. Cole Rakes, MD Taking Active Self           Med Note [name], [name]   Thu Jan 23, 2022 10:22 AM) Takes 3x/wk  sacubitril-valsartan (ENTRESTO) 49-51 MG [id number] Yes Take 1 tablet by mouth 2 (two) times daily. [name] M, PA-C Taking Active   sodium zirconium cyclosilicate (LOKELMA) 10 g PACK packet [id number] Yes Take 10 g by mouth as needed. [provider] Taking Active            Med Note [name], [name]   Thu Jan 23, 2022 10:23 AM) Taking 1-2x/wk  spironolactone (ALDACTONE) 25 MG tablet [id number] Yes Take 1 tablet (25 mg total) by mouth at bedtime. Cole Dresser, MD Taking Active   tiotropium [facility] HANDIHALER) 18 MCG inhalation capsule [id number] Yes Place 1 capsule (18 mcg total) into inhaler and inhale daily. Cole Rakes, MD Taking Active Self            Patient Active Problem List   Diagnosis Date Noted   Acute combined systolic and diastolic congestive heart failure (HCC)    AKI (acute kidney injury) ([place])    Acute exacerbation of CHF (congestive heart failure) ([place]) 03/05/2021   SVT (supraventricular tachycardia) 06/15/2020   Slurred speech 07/05/2019   ICD (implantable cardioverter-defibrillator) in place 07/20/2018   COPD (chronic obstructive pulmonary disease) ([place]) 12/09/2017   Obstructive sleep apnea 09/08/2016   Autonomic  dysfunction 07/15/2015   DJD (degenerative joint disease), lumbar 06/19/2015   Tremor 05/14/2015   Abnormality of gait 05/14/2015   Neurogenic bladder 04/17/2015   Lumbar strain 03/23/2015   Leg weakness 03/23/2015   Major depression, chronic    History of stroke 02/26/2015   Benign essential HTN    ETOH abuse    Tobacco abuse    Chronic combined systolic and diastolic heart failure ([place])    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   Smoking greater than 20 pack years 04/19/2014   Polysubstance abuse ([place]) 03/22/2014    Conditions to be addressed/monitored per PCP order:  CHF  Care Plan : RN Care Manager Plan of Care  Updates made by [name], RN since 03/31/2022 12:00 AM     Problem: Health Management needs related to CHF      Long-Range Goal: Development of Plan of Care to address Health Management needs related to CHF Completed 03/31/2022  Start Date: 05/16/2021  Expected End Date: 03/20/2022  Priority: High  Note:   Current Barriers:  Chronic Disease Management support and education needs related to CHF Mr. Derouin is no longer participating in Cardiac Rehab. He is working some at [employer], getting in lots of walking.  Mr. Gersh reports feeling good. He is working with Producer, television/film/video and reports having and taking all of his medications.  RNCM Clinical Goal(s):  Patient will verbalize understanding of plan for management of CHF as evidenced by patient reports take all medications exactly as prescribed and will call provider for medication related questions as evidenced by patient reports and documentation in EMR    attend all scheduled medical appointments: 03/27/22 with Pharmacy, 04/21/22 with HF Clinic and 06/12/22 with PCP as evidenced by provider documentation in EMR        work with community resource care guide to address needs related to Limited access to food and Level of care concerns as evidenced by patient and/or community resource care guide support    through  collaboration with Consulting civil engineer, provider, and care team.   Interventions: Inter-disciplinary care team collaboration (see longitudinal plan of care) Evaluation of current treatment plan related to  self management and patient's adherence to plan as established by provider  RNCM discussed closing this case, advised Mr. Ishman to reach out to [facility] should any new needs arise   Heart Failure Interventions:  (Status: Goal Met.)  Long Term Goal  Wt Readings from Last 3 Encounters:  02/06/22 145 lb (65.8 kg)  01/23/22 147 lb 3.2 oz (66.8 kg)  01/02/22 144 lb (65.3 kg)   Discussed the importance of keeping all appointments with provider Advised patient to discuss any concerns or questions with provider Assessed social determinant of health barriers Reviewed upcoming appointments with HF Clinic on 04/21/22 and PCP on 06/12/22 Reviewed recent provider notes  Patient Goals/Self-Care Activities: Take medications as prescribed   Attend all scheduled provider appointments Call pharmacy for medication refills 3-7 days in advance of Wells out of medications Call provider office for new concerns or questions  use salt in moderation watch for swelling in feet, ankles and legs every day eat more whole grains, fruits and vegetables, lean meats and healthy fats       Follow Up:  Patient agrees to Care Plan and Follow-up.  Plan: The  Patient has been provided with contact information for the Managed Medicaid care management team and has been advised to call with any health related questions or concerns.  Date/time of next scheduled RN care management/care coordination outreach:  no follow up  [name] RN, [place] RN Care Coordinator

## 2022-04-02 ENCOUNTER — Telehealth (HOSPITAL_COMMUNITY): Payer: Self-pay

## 2022-04-02 NOTE — Telephone Encounter (Signed)
Reached out to pt to confirm time for v/s check after med changes from last visit.  He actually did not p/u his bubble packs with the increased dose of entresto so no need for v/s check today.   I did reach out to pharmacy to ensure they did have the increased dose so will ask pharmacy to fill 2 wks for now and will get b/p check in the next week or two to make sure he is tolerating the increased dose.  Marylouise Stacks, [place] 04/02/2022

## 2022-04-18 ENCOUNTER — Ambulatory Visit: Payer: Medicaid Other

## 2022-04-18 DIAGNOSIS — I428 Other cardiomyopathies: Secondary | ICD-10-CM

## 2022-04-18 NOTE — Progress Notes (Signed)
Advanced Heart Failure Clinic Progress Note    PCP: [name], MD HF Cardiology: Dr. Aundra Dubin  57 y.o. with history of nonischemic cardiomyopathy was referred by [name] for evaluation of CHF.  Cardiomyopathy has been diagnosed since 2016 when echo showed EF 20%.  He has a history of cocaine use and ETOH abuse. Currently, drinking about 3 beers/day.  He is smoking.  No recent cocaine.  He had a stroke in 2017 in setting of uncontrolled HTN and cocaine abuse.  He has a Research officer, political party ICD.  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  Last echo in 2/23 showed EF < 20%, severe LV dilation, moderately decreased RV systolic function with mild RV enlargement. He has 13 siblings, none of whom have known cardiac disease.  His grandmother had CHF and there apparently was conversation about a heart transplant but she passed away.   Follow up 09-27-2022 with Dr. Aundra Dubin, NYHA II-III, volume stable. CPX, cMRI and evaluation for BAT arranged.   CPX (9/23) showed severe functional limitation due to HF and deconditioning.   Follow up 9/23, NYHA III-IIIb , labs showed AKI and GDMT held. Required a dose of Lokelma for hyperkalemia.  Cardiac MRI 10/23 (difficult images due to ICD artifact) LVEF 24%, RVEF 34%.  He presents today for f/u and titration of meds. Doing well. Says he feels significantly better. Breathing much improved. Less fatigue. NYHA class I-II. No LEE. Improved compliance w/ meds. Followed by paramedicine. Had bubble pill packs. Still smoking ~1/4 ppd. Tried chantix but unable to tolerate due to nightmares. Still drinking beer 3-6 per sitting, mainly on weekends.   Unable to interrogate device today Set designer out of service)    Labs (6/23): hgb 15.6, BNP 673, K 4.2, creatinine 1.23 Labs 09/27/2022): K 3.9, creatinine 1.13 Labs (9/23): K 4.8, creatinine 1.53 Labs (10/23): K 3.6, creatinine 1.07 Labs (11/23): K 4.3, creatinine 1.4, BNP 152 Labs (5/23): 4.3, creatinine 1.24 Lab  (12/23): K 4.3, creatinine 1.24   PMH: 1. H/o seizure disorder: ?ETOH. 2. HTN 3. 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  4. H/o SVT 5. OSA 6. COPD: Active smoker 7. VT: St Jude ICD.  ATP 6/23.  8. Chronic systolic CHF: Nonischemic cardiomyopathy.  Documented since at least 2016. St Jude ICD.  - Echo (3/16): EF 20% - Echo (2/23): EF < 20%, severe LV dilation, moderately decreased RV systolic function with mild RV enlargement.  - Coronary CTA (6/23): 81st percentile calcium score, mild nonobstructive CAD.  - CPX (9/23): Peak VO2: 14.3 (43% predicted peak VO2), VE/VCO2 slope: 39, OUES: 1.10, Peak RER: 0.70  - Cardiac MRI 10/23 (difficult images due to ICD artifact) LVEF 24%, RVEF 34%. 9. Prior h/o cocaine  Social History   Socioeconomic History   Marital status: Single    Spouse name: Not on file   Number of children: 1   Years of education: 12   Highest education level: Not on file  Occupational History   Not on file  Tobacco Use   Smoking status: Every Day    Packs/day: 0.50    Years: 20.00    Additional pack years: 0.00    Total pack years: 10.00    Types: Cigarettes   Smokeless tobacco: Never   Tobacco comments:    02/25/22 down to 1/2 pack a day  Vaping Use   Vaping Use: Never used  Substance and Sexual Activity   Alcohol use: Yes    Alcohol/week: 1.0 standard drink of alcohol  Types: 1 Cans of beer per week    Comment: socially    Drug use: Not Currently    Types: Cocaine    Comment: stopped using cocaine 11/19/14   Sexual activity: Not on file  Other Topics Concern   Not on file  Social History Narrative   Lives alone, nurse comes 2 hrs daily   Social Determinants of Health   Financial Resource Strain: High Risk (11/14/2021)   Overall Financial Resource Strain (CARDIA)    Difficulty of Paying Living Expenses: Hard  Food Insecurity: Food Insecurity Present (12/24/2021)   Hunger Vital Sign    Worried About Running Out of Food in the Last Year:  Sometimes true    Ran Out of Food in the Last Year: Sometimes true  Transportation Needs: Unmet Transportation Needs (02/25/2022)   PRAPARE - Hydrologist (Medical): Yes    Lack of Transportation (Non-Medical): Yes  Physical Activity: Not on file  Stress: Not on file  Social Connections: Not on file  Intimate Partner Violence: Not on file   Family History  Problem Relation Age of Onset   Hypertension Mother    Hyperlipidemia Mother    Hypertension Father    Stroke Maternal Aunt    Hypertension Sister    Hypertension Brother    ROS: All systems reviewed and negative except as per HPI.   Current Outpatient Medications  Medication Sig Dispense Refill   Acetaminophen-guaiFENesin (MUCINEX COLD & FLU PO) Take by mouth.     albuterol (PROAIR HFA) 108 (90 Base) MCG/ACT inhaler Inhale 2 puffs into the lungs every 6 (six) hours as needed for shortness of breath. 18 g 6   aspirin 81 MG EC tablet Take 81 mg by mouth in the morning. Swallow whole.     atorvastatin (LIPITOR) 40 MG tablet Take 1 tablet (40 mg total) by mouth daily. 90 tablet 1   dapagliflozin propanediol (FARXIGA) 10 MG TABS tablet Take 1 tablet (10 mg total) by mouth daily before breakfast. 30 tablet 11   digoxin (LANOXIN) 0.125 MG tablet Take 1 tablet (0.125 mg total) by mouth daily. 90 tablet 1   fluticasone-salmeterol (ADVAIR DISKUS) 250-50 MCG/ACT AEPB Inhale 1 puff into the lungs in the morning and at bedtime. 1 each 3   furosemide (LASIX) 20 MG tablet Take 2 tablets (40 mg total) every day. 180 tablet 2   MAGNESIUM OXIDE -MG SUPPLEMENT PO Take 1 tablet by mouth in the morning.     metoprolol succinate (TOPROL-XL) 100 MG 24 hr tablet Take 1 tablet (100 mg total) by mouth at bedtime. 90 tablet 3   mirtazapine (REMERON SOL-TAB) 15 MG disintegrating tablet Take 1 tablet (15 mg total) by mouth at bedtime. 90 tablet 1   sacubitril-valsartan (ENTRESTO) 97-103 MG Take 1 tablet by mouth 2 (two) times  daily. 60 tablet 11   sodium zirconium cyclosilicate (LOKELMA) 10 g PACK packet Take 10 g by mouth as needed.     spironolactone (ALDACTONE) 25 MG tablet Take 1 tablet (25 mg total) by mouth at bedtime. 90 tablet 3   tiotropium (SPIRIVA HANDIHALER) 18 MCG inhalation capsule Place 1 capsule (18 mcg total) into inhaler and inhale daily. 30 capsule 6   No current facility-administered medications for this visit.   Wt Readings from Last 3 Encounters:  03/27/22 63 kg (138 lb 12.8 oz)  03/03/22 64 kg (141 lb)  02/06/22 65.8 kg (145 lb)   There were no vitals taken for this visit.  PHYSICAL EXAM: General:  Well appearing. No respiratory difficulty HEENT: normal Neck: supple. no JVD. Carotids 2+ bilat; no bruits. No lymphadenopathy or thyromegaly appreciated. Cor: PMI nondisplaced. Regular rate & rhythm. No rubs, gallops or murmurs. Lungs: clear Abdomen: soft, nontender, nondistended. No hepatosplenomegaly. No bruits or masses. Good bowel sounds. Extremities: no cyanosis, clubbing, rash, edema Neuro: alert & oriented x 3, cranial nerves grossly intact. moves all 4 extremities w/o difficulty. Affect pleasant.   Assessment/Plan: 1. Chronic systolic CHF: Nonischemic cardiomyopathy.  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  Cause of CMP uncertain => prior cocaine abuse but not currently.  He drinks about 3 beers/day, denies heavier drinking in past but apparently there was concern that he had had ETOH withdrawal seizures in the past.  His grandmother had CHF but apparently none of his 13 siblings have known cardiac disease. EF has been low since at least 2016.  Most recent echo in 2/23 showed EF < 20%, severe LV dilation, moderately decreased RV systolic function with mild RV enlargement. He has a Research officer, political party ICD.  CPX 9/23 showed severe functional limitation from both HF and deconditioning. Cardiac MRI 10/23, technically difficult study due to ICD artifact, LVEF 24%, no evidence of infiltrative  disease. NYHA class I-II symptoms. Euvolemic on exam.  - Continue Farxiga 10 mg daily. [place] Entresto to 49-51 mg bid  - Continue Lasix 40 mg daily  - Continue digoxin 0.125 mg daily. Check dig level today. - Continue Toprol XL 100 mg daily. - Continue spironolactone 25 mg daily.   - Encouraged to cut back on ETOH. - Bifurcation too high for [employer], working on getting Medicaid approval for commercial baroreceptor activation therapy.  - We briefly discussed advanced therapies. Not transplant candidate with on-going tobacco use.  Would likely be VAD candidate if needed.  - Check BMP today  2. COPD: He has cut back but is still smoking.  Failed nicotine patches. Failed Chantix (stopped due to nightmares)   F/u w/ pharmD in ~4 wks and APP in 8 wks for further med titration. Appreciate para medicine.   Maricela Bo [place], [place]  04/18/2022

## 2022-04-21 ENCOUNTER — Other Ambulatory Visit (HOSPITAL_COMMUNITY): Payer: Self-pay

## 2022-04-21 ENCOUNTER — Ambulatory Visit (INDEPENDENT_AMBULATORY_CARE_PROVIDER_SITE_OTHER): Payer: Medicaid Other

## 2022-04-21 ENCOUNTER — Encounter (HOSPITAL_COMMUNITY): Payer: Self-pay

## 2022-04-21 ENCOUNTER — Ambulatory Visit
Admission: RE | Admit: 2022-04-21 | Discharge: 2022-04-21 | Disposition: A | Discharge status: Home / Self Care | Payer: Medicaid Other | Source: Ambulatory Visit | Attending: Cardiology | Admitting: Cardiology

## 2022-04-21 VITALS — BP 146/84 | HR 94 | Wt 144.4 lb

## 2022-04-21 DIAGNOSIS — Z8673 Personal history of transient ischemic attack (TIA), and cerebral infarction without residual deficits: Secondary | ICD-10-CM | POA: Diagnosis not present

## 2022-04-21 DIAGNOSIS — I110 Hypertensive heart disease with heart failure: Secondary | ICD-10-CM | POA: Insufficient documentation

## 2022-04-21 DIAGNOSIS — Z79899 Other long term (current) drug therapy: Secondary | ICD-10-CM | POA: Insufficient documentation

## 2022-04-21 DIAGNOSIS — Z95810 Presence of automatic (implantable) cardiac defibrillator: Secondary | ICD-10-CM | POA: Diagnosis not present

## 2022-04-21 DIAGNOSIS — I428 Other cardiomyopathies: Secondary | ICD-10-CM | POA: Diagnosis not present

## 2022-04-21 DIAGNOSIS — I10 Essential (primary) hypertension: Secondary | ICD-10-CM

## 2022-04-21 DIAGNOSIS — I5022 Chronic systolic (congestive) heart failure: Secondary | ICD-10-CM | POA: Insufficient documentation

## 2022-04-21 DIAGNOSIS — F17210 Nicotine dependence, cigarettes, uncomplicated: Secondary | ICD-10-CM | POA: Insufficient documentation

## 2022-04-21 DIAGNOSIS — Z8249 Family history of ischemic heart disease and other diseases of the circulatory system: Secondary | ICD-10-CM | POA: Diagnosis not present

## 2022-04-21 DIAGNOSIS — J449 Chronic obstructive pulmonary disease, unspecified: Secondary | ICD-10-CM | POA: Insufficient documentation

## 2022-04-21 DIAGNOSIS — Z7984 Long term (current) use of oral hypoglycemic drugs: Secondary | ICD-10-CM | POA: Insufficient documentation

## 2022-04-21 DIAGNOSIS — I2510 Atherosclerotic heart disease of native coronary artery without angina pectoris: Secondary | ICD-10-CM | POA: Diagnosis not present

## 2022-04-21 LAB — BASIC METABOLIC PANEL
Anion gap: 14 (ref 5–15)
BUN: 10 mg/dL (ref 6–20)
CO2: 24 mmol/L (ref 22–32)
Calcium: 9.5 mg/dL (ref 8.9–10.3)
Chloride: 101 mmol/L (ref 98–111)
Creatinine, Ser: 1.11 mg/dL (ref 0.61–1.24)
GFR, Estimated: >60 mL/min (ref >60)
Glucose, Bld: 80 mg/dL (ref 70–99)
Potassium: 3.6 mmol/L (ref 3.5–5.1)
Sodium: 139 mmol/L (ref 135–145)

## 2022-04-21 LAB — BRAIN NATRIURETIC PEPTIDE: B Natriuretic Peptide: 488.4 pg/mL — ABNORMAL HIGH (ref 0.0–100.0)

## 2022-04-21 LAB — DIGOXIN LEVEL: Digoxin Level: 0.4 ng/mL — ABNORMAL LOW (ref 0.8–2.0)

## 2022-04-21 MED ORDER — POTASSIUM CHLORIDE CRYS ER 20 MEQ PO TBCR: 20.0000 meq | EXTENDED_RELEASE_TABLET | Freq: Every day | ORAL | 0 refills | Status: DC

## 2022-04-21 MED ORDER — POTASSIUM CHLORIDE CRYS ER 20 MEQ PO TBCR: 20.0000 meq | EXTENDED_RELEASE_TABLET | Freq: Two times a day (BID) | ORAL | 0 refills | Status: DC

## 2022-04-21 MED ORDER — FUROSEMIDE 20 MG PO TABS: 40.0000 mg | ORAL_TABLET | Freq: Every day | ORAL | 2 refills | Status: DC

## 2022-04-21 NOTE — Progress Notes (Signed)
Paramedicine Encounter    Patient ID: [name], male    DOB: 12-Dec-1965, 57 y.o.   MRN: [id number]   Complaints- Reports having some coughing in the mornings when he wakes up.   Assessment- CAOX4, warm and dry, no swelling, weight is up.   Compliance with meds- taking meds as placed in bubble packs, confirmed for accuracy.   Pill box filled- uses bubble packs from [facility]- verified.   Refills needed- NONE   Meds changes since last visit- NON  Med changes today in clinic- Lasix to 40mg  BID for 3 days, Potassium 81mEq once daily for three days.     Social changes- NONE    There were no vitals taken for this visit. Weight yesterday-146lbs Last visit weight-141lbs  Met with Sofia in clinic today where he reports to be doing well. He is working full time with [employer] and now has a car. He reports he has been feeling good with no complaints other than a mild cough this week when he is waking up. [employer] device showed some fluid retention per device check and [name] NP increased lasix 40mg  BID for three days and added potassium 50meq for three days then back to his normal 40mg  of lasix with no potassium. Labs checked today. He is getting all meds through [facility] via bubble packs and has no issues with same. He is compliant with them. He reports that he has been feeling good overall. 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 We reviewed upcoming appointments which he was able to pull up on his my chart. He reports no social concerns at present and agreed with graduating from paramedicine and was grateful for our assistance. Visit complete.   [name], [place] 04/21/2022    ACTION: Home visit completed    Patient Care Team: [name], MD as PCP - General (Family  Medicine) Skeet Latch, MD as PCP - Cardiology (Cardiology) Constance Haw, MD as PCP - Electrophysiology (Cardiology) Skeet Latch, MD as Attending Physician (Cardiology)  Patient Active Problem List   Diagnosis Date Noted   Acute combined systolic and diastolic congestive heart failure    AKI (acute kidney injury)    Acute exacerbation of CHF (congestive heart failure) 03/05/2021   SVT (supraventricular tachycardia) 06/15/2020   Slurred speech 07/05/2019   ICD (implantable cardioverter-defibrillator) in place 07/20/2018   COPD (chronic obstructive pulmonary disease) 12/09/2017   Obstructive sleep apnea 09/08/2016   Autonomic dysfunction 07/15/2015   DJD (degenerative joint disease), lumbar 06/19/2015   Tremor 05/14/2015   Abnormality of gait 05/14/2015   Neurogenic bladder 04/17/2015   Lumbar strain 03/23/2015   Leg weakness 03/23/2015   Major depression, chronic    History of stroke 02/26/2015   Benign essential HTN    ETOH abuse    Tobacco abuse    Chronic combined systolic and diastolic heart failure    Alcoholic cardiomyopathy [id number]   Smoking greater than 20 pack years 04/19/2014   Polysubstance abuse 03/22/2014    Current Outpatient Medications:    albuterol (PROAIR HFA) 108 (90 Base) MCG/ACT inhaler, Inhale 2 puffs into the lungs every 6 (six) hours as needed for shortness of breath., Disp: 18 g, Rfl: 6   aspirin 81 MG EC tablet, Take 81 mg by mouth in the morning. Swallow whole., Disp: , Rfl:  atorvastatin (LIPITOR) 40 MG tablet, Take 1 tablet (40 mg total) by mouth daily., Disp: 90 tablet, Rfl: 1   dapagliflozin propanediol (FARXIGA) 10 MG TABS tablet, Take 1 tablet (10 mg total) by mouth daily before breakfast., Disp: 30 tablet, Rfl: 11   digoxin (LANOXIN) 0.125 MG tablet, Take 1 tablet (0.125 mg total) by mouth daily., Disp: 90 tablet, Rfl: 1   fluticasone-salmeterol (ADVAIR DISKUS) 250-50 MCG/ACT AEPB, Inhale 1 puff into the lungs in the  morning and at bedtime., Disp: 1 each, Rfl: 3   furosemide (LASIX) 20 MG tablet, Take 2 tablets (40 mg total) by mouth daily. Take 2 tablets (40 mg total) every day., Disp: 180 tablet, Rfl: 2   MAGNESIUM OXIDE -MG SUPPLEMENT PO, Take 1 tablet by mouth in the morning., Disp: , Rfl:    metoprolol succinate (TOPROL-XL) 100 MG 24 hr tablet, Take 1 tablet (100 mg total) by mouth at bedtime., Disp: 90 tablet, Rfl: 3   mirtazapine (REMERON SOL-TAB) 15 MG disintegrating tablet, Take 1 tablet (15 mg total) by mouth at bedtime., Disp: 90 tablet, Rfl: 1   potassium chloride SA (KLOR-CON M) 20 MEQ tablet, Take 1 tablet (20 mEq total) by mouth daily. For 3 days only, Disp: 3 tablet, Rfl: 0   sacubitril-valsartan (ENTRESTO) 97-103 MG, Take 1 tablet by mouth 2 (two) times daily., Disp: 60 tablet, Rfl: 11   spironolactone (ALDACTONE) 25 MG tablet, Take 1 tablet (25 mg total) by mouth at bedtime., Disp: 90 tablet, Rfl: 3   tiotropium (SPIRIVA HANDIHALER) 18 MCG inhalation capsule, Place 1 capsule (18 mcg total) into inhaler and inhale daily., Disp: 30 capsule, Rfl: 6 Allergies  Allergen Reactions   Penicillins Other (See Comments)    Blisters  Has patient had a PCN reaction causing immediate rash, facial/tongue/throat swelling, SOB or lightheadedness with hypotension: Yes Has patient had a PCN reaction causing severe rash involving mucus membranes or skin necrosis: Yes Has patient had a PCN reaction that required hospitalization: No Has patient had a PCN reaction occurring within the last 10 years: No If all of the above answers are "NO", then may proceed with Cephalosporin use.      Social History   Socioeconomic History   Marital status: Single    Spouse name: Not on file   Number of children: 1   Years of education: 12   Highest education level: Not on file  Occupational History   Not on file  Tobacco Use   Smoking status: Every Day    Packs/day: 0.50    Years: 20.00    Additional pack years:  0.00    Total pack years: 10.00    Types: Cigarettes   Smokeless tobacco: Never   Tobacco comments:    02/25/22 down to 1/2 pack a day  Vaping Use   Vaping Use: Never used  Substance and Sexual Activity   Alcohol use: Yes    Alcohol/week: 1.0 standard drink of alcohol    Types: 1 Cans of beer per week    Comment: socially    Drug use: Not Currently    Types: Cocaine    Comment: stopped using cocaine 11/19/14   Sexual activity: Not on file  Other Topics Concern   Not on file  Social History Narrative   Lives alone, nurse comes 2 hrs daily   Social Determinants of Health   Financial Resource Strain: High Risk (11/14/2021)   Overall Financial Resource Strain (CARDIA)    Difficulty of Paying Living Expenses: Hard  Food Insecurity: Food Insecurity Present (12/24/2021)   Hunger Vital Sign    Worried About Running Out of Food in the Last Year: Sometimes true    Ran Out of Food in the Last Year: Sometimes true  Transportation Needs: Unmet Transportation Needs (02/25/2022)   PRAPARE - Hydrologist (Medical): Yes    Lack of Transportation (Non-Medical): Yes  Physical Activity: Not on file  Stress: Not on file  Social Connections: Not on file  Intimate Partner Violence: Not on file    Physical Exam      Future Appointments  Date Time Provider [place]  05/12/2022  7:25 AM [facility] DEVICE REMOTES [facility]  06/12/2022 10:30 AM [name], MD [facility] None  06/19/2022  9:25 AM [facility] DEVICE REMOTES [facility]  08/11/2022  7:25 AM [facility] DEVICE REMOTES [facility]  11/10/2022  7:25 AM [facility] DEVICE REMOTES [facility]  02/09/2023  7:25 AM [facility] DEVICE REMOTES [facility]

## 2022-04-21 NOTE — Patient Instructions (Addendum)
Thank you for coming in today  Labs were done today, if any labs are abnormal the clinic will call you No news is good news  Medications: INCREASE Lasix to 40 mg twice daily for 3 days then after 3 days go to 40 mg once daily Take Potassium 20 meq for 3 days then STOP  Follow up appointments:  Your physician recommends that you schedule a follow-up appointment in:  3 months with Dr. Kendall Flack will receive a reminder letter in the mail a few months in advance. If you don't receive a letter, please call our office to schedule the follow-up appointment.     Do the following things EVERYDAY: Weigh yourself in the morning before breakfast. Write it down and keep it in a log. Take your medicines as prescribed Eat low salt foods--Limit salt (sodium) to 2000 mg per day.  Stay as active as you can everyday Limit all fluids for the day to less than 2 liters   At the [facility], you and your health needs are our priority. As part of our continuing mission to provide you with exceptional heart care, we have created designated Provider Care Teams. These Care Teams include your primary Cardiologist (physician) and Advanced Practice Providers (APPs- Physician Assistants and Nurse Practitioners) who all work together to provide you with the care you need, when you need it.   You may see any of the following providers on your designated Care Team at your next follow up: Dr Glori Bickers Dr Loralie Champagne Dr. Roxana Hires, NP Lyda Jester, [place] [facility] [place], [place] Forestine Na, NP Audry Riles, PharmD   Please be sure to bring in all your medications bottles to every appointment.    Thank you for choosing [facility]  If you have any questions or concerns before your next appointment please send [name] a message through [place] or call our office at [phone].    TO LEAVE A MESSAGE FOR THE NURSE  SELECT OPTION 2, PLEASE LEAVE A MESSAGE INCLUDING: YOUR NAME DATE OF BIRTH CALL BACK NUMBER REASON FOR CALL**this is important as we prioritize the call backs  YOU WILL RECEIVE A CALL BACK THE SAME DAY AS LONG AS YOU CALL BEFORE 4:00 PM

## 2022-04-22 NOTE — Progress Notes (Signed)
EPIC Encounter for ICM Monitoring  Patient Name: [name] is a 57 y.o. male Date: 04/22/2022 Primary Care Physican: [name], MD Primary Cardiologist: Aundra Dubin Electrophysiologist: Curt Bears 12/02/2021 Weight: 145 lbs (baseline 140 lbs per patient)     12/03/2021 Weight: 147.8 lbs  12/09/2021 Weight: 140 lbs (baseline) 02/11/2022 Weight: 144 lbs (baseline 140-141 lbs) 02/17/2022 Weight: 142 lbs 03/19/2022 Weight: 147 lbs                                                             Pt seen in HF clinic 4/1.     Diet:  Does not follow low salt diet   CorVue thoracic impedance suggesting possible fluid accumulation starting 3/24.    Prescribed:  Furosemide 20 mg take 2 tablets (40 mg total) by mouth daily (increased 12/26/21).  Spironolactone 25 mg take 0.5 tablet by mouth at bedtime   Pt being followed by [name], EMT paramedicine program.   Labs: 04/21/2022 Creatinine 1.11, BUN 10, Potassium 3.6, Sodium 139, GFR >60  03/27/2022 Creatinine 0.96, BUN 11, Potassium 3.6, Sodium 134, GFR >60  02/06/2022 Creatinine 0.98, BUN 13, Potassium 3.5, Sodium 136, GFR >60  01/07/2022 Creatinine 1.24, BUN 24, Potassium 4.3, Sodium 136, GFR >60 A complete set of results can be found in Results Review.   Recommendations:  Recommendations given by [name], NP at HF clinic to Increase Lasix to 40 mg bid, take 20 KCL x 3 days; then back to Lasix 40 mg daily with no KCL.   Recheck fluid levels 4/8.   Follow-up plan: ICM clinic phone appointment on 04/28/2022 to recheck fluid levels.    91 day device clinic remote transmission 05/12/2022.     EP/Cardiology Office Visits:  Recall 07/20/2022 with Dr Aundra Dubin.  Recall 10/18/2022 with EP APP.     Copy of ICM check sent to Dr. Curt Bears.     3 month ICM trend: 04/21/2022.    12-14 Month ICM trend:     Rosalene Billings, RN 04/22/2022 3:26 PM

## 2022-04-23 NOTE — Progress Notes (Signed)
Remote ICD transmission.   

## 2022-04-25 LAB — CUP PACEART REMOTE DEVICE CHECK
Battery Remaining Longevity: 64 mo
Battery Remaining Percentage: 63 %
Battery Voltage: 2.96 V
Brady Statistic RV Percent Paced: 1 %
Date Time Interrogation Session: 20240404211020
HighPow Impedance: 61 Ohm
HighPow Impedance: 61 Ohm
Implantable Lead Connection Status: 753985
Implantable Lead Implant Date: 20191230
Implantable Lead Location: 753860
Implantable Lead Model: 7122
Implantable Pulse Generator Implant Date: 20191230
Lead Channel Impedance Value: 510 Ohm
Lead Channel Pacing Threshold Amplitude: 0.75 V
Lead Channel Pacing Threshold Pulse Width: 0.5 ms
Lead Channel Sensing Intrinsic Amplitude: 11.6 mV
Lead Channel Setting Pacing Amplitude: 2.5 V
Lead Channel Setting Pacing Pulse Width: 0.5 ms
Lead Channel Setting Sensing Sensitivity: 0.5 mV
Pulse Gen Serial Number: 9850980
Zone Setting Status: 755011

## 2022-04-28 ENCOUNTER — Ambulatory Visit: Payer: Medicaid Other | Attending: Cardiology

## 2022-04-28 DIAGNOSIS — Z95810 Presence of automatic (implantable) cardiac defibrillator: Secondary | ICD-10-CM

## 2022-04-28 DIAGNOSIS — I5022 Chronic systolic (congestive) heart failure: Secondary | ICD-10-CM

## 2022-04-29 NOTE — Progress Notes (Signed)
EPIC Encounter for ICM Monitoring  Patient Name: [name] is a 57 y.o. male Date: 04/29/2022 Primary Care Physican: Hoy Register, MD Primary Cardiologist: Shirlee Latch Electrophysiologist: Elberta Fortis 12/02/2021 Weight: 145 lbs (baseline 140 lbs per patient)     12/03/2021 Weight: 147.8 lbs  12/09/2021 Weight: 140 lbs (baseline) 02/11/2022 Weight: 144 lbs (baseline 140-141 lbs) 02/17/2022 Weight: 142 lbs 03/19/2022 Weight: 147 lbs                                                             Spoke with patient and heart failure questions reviewed.  Transmission results reviewed.  Pt asymptomatic for fluid accumulation.  Reports feeling well at this time and voices no complaints.     Diet:  Does not follow low salt diet   CorVue thoracic impedance suggesting fluid levels returned to normal after taking lasix 40 mg bid x 3 days per Prince Rome, NP at [facility] clinic 4/1 appointment.    Prescribed:  Furosemide 20 mg take 2 tablets (40 mg total) by mouth daily (increased 12/26/21).  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    Labs: 04/21/2022 Creatinine 1.11, BUN 10, Potassium 3.6, Sodium 139, GFR >60  03/27/2022 Creatinine 0.96, BUN 11, Potassium 3.6, Sodium 134, GFR >60  02/06/2022 Creatinine 0.98, BUN 13, Potassium 3.5, Sodium 136, GFR >60  01/07/2022 Creatinine 1.24, BUN 24, Potassium 4.3, Sodium 136, GFR >60 A complete set of results can be found in Results Review.   Recommendations: Confirmed he has resumed prescribed Lasix dosage of 40 mg daily.   No changes and encouraged to call if experiencing any fluid symptoms.   Follow-up plan: ICM clinic phone appointment on 05/26/2022.    91 day device clinic remote transmission 05/12/2022.     EP/Cardiology Office Visits:  Recall 07/20/2022 with Dr Shirlee Latch.  Recall 10/18/2022 with EP APP.     Copy of ICM check sent to Dr. Elberta Fortis.     3 month ICM trend:  04/28/2022.    12-14 Month ICM trend:     Karie Soda, RN 04/29/2022 4:54 PM

## 2022-05-12 ENCOUNTER — Ambulatory Visit (INDEPENDENT_AMBULATORY_CARE_PROVIDER_SITE_OTHER): Payer: Medicaid Other

## 2022-05-12 DIAGNOSIS — I428 Other cardiomyopathies: Secondary | ICD-10-CM | POA: Diagnosis not present

## 2022-05-12 DIAGNOSIS — I5022 Chronic systolic (congestive) heart failure: Secondary | ICD-10-CM

## 2022-05-13 LAB — CUP PACEART REMOTE DEVICE CHECK
Battery Remaining Longevity: 64 mo
Battery Remaining Percentage: 62 %
Battery Voltage: 2.96 V
Brady Statistic RV Percent Paced: 1 %
Date Time Interrogation Session: 20240422040016
HighPow Impedance: 65 Ohm
HighPow Impedance: 65 Ohm
Implantable Lead Connection Status: 753985
Implantable Lead Implant Date: 20191230
Implantable Lead Location: 753860
Implantable Lead Model: 7122
Implantable Pulse Generator Implant Date: 20191230
Lead Channel Impedance Value: 450 Ohm
Lead Channel Pacing Threshold Amplitude: 0.75 V
Lead Channel Pacing Threshold Pulse Width: 0.5 ms
Lead Channel Sensing Intrinsic Amplitude: 11.6 mV
Lead Channel Setting Pacing Amplitude: 2.5 V
Lead Channel Setting Pacing Pulse Width: 0.5 ms
Lead Channel Setting Sensing Sensitivity: 0.5 mV
Pulse Gen Serial Number: 9850980
Zone Setting Status: 755011

## 2022-05-14 ENCOUNTER — Other Ambulatory Visit: Payer: Self-pay

## 2022-05-14 DIAGNOSIS — I472 Ventricular tachycardia, unspecified: Secondary | ICD-10-CM

## 2022-05-14 MED ORDER — METOPROLOL SUCCINATE ER 200 MG PO TB24: 200.0000 mg | ORAL_TABLET | Freq: Every day | ORAL | 11 refills | Status: DC

## 2022-05-20 NOTE — Addendum Note (Signed)
Addended by: Elease Etienne A on: 05/20/2022 01:14 PM   Modules accepted: Level of Service

## 2022-05-20 NOTE — Progress Notes (Signed)
Remote ICD transmission.   

## 2022-05-26 ENCOUNTER — Ambulatory Visit: Payer: Medicaid Other | Attending: Cardiology

## 2022-05-26 DIAGNOSIS — Z95810 Presence of automatic (implantable) cardiac defibrillator: Secondary | ICD-10-CM

## 2022-05-26 DIAGNOSIS — I5022 Chronic systolic (congestive) heart failure: Secondary | ICD-10-CM | POA: Diagnosis not present

## 2022-05-28 ENCOUNTER — Telehealth: Payer: Self-pay

## 2022-05-28 NOTE — Progress Notes (Signed)
EPIC Encounter for ICM Monitoring  Patient Name: [name] is a 57 y.o. male Date: 05/28/2022 Primary Care Physican: Hoy Register, MD Primary Cardiologist: Shirlee Latch Electrophysiologist: Elberta Fortis 12/02/2021 Weight: 145 lbs (baseline 140 lbs per patient)     12/03/2021 Weight: 147.8 lbs  12/09/2021 Weight: 140 lbs (baseline) 02/11/2022 Weight: 144 lbs (baseline 140-141 lbs) 02/17/2022 Weight: 142 lbs 03/19/2022 Weight: 147 lbs                                                             Attempted call to patient and unable to reach.  Transmission reviewed.    Diet:  Does not follow low salt diet   CorVue thoracic impedance suggesting normal fluid levels.   Prescribed:  Furosemide 20 mg take 2 tablets (40 mg total) by mouth daily (increased 12/26/21).  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     Labs: 04/21/2022 Creatinine 1.11, BUN 10, Potassium 3.6, Sodium 139, GFR >60  03/27/2022 Creatinine 0.96, BUN 11, Potassium 3.6, Sodium 134, GFR >60  02/06/2022 Creatinine 0.98, BUN 13, Potassium 3.5, Sodium 136, GFR >60  01/07/2022 Creatinine 1.24, BUN 24, Potassium 4.3, Sodium 136, GFR >60 A complete set of results can be found in Results Review.   Recommendations:  Unable to reach.     Follow-up plan: ICM clinic phone appointment on 06/30/2022.    91 day device clinic remote transmission 08/11/2022.     EP/Cardiology Office Visits:  Recall 07/20/2022 with Dr Shirlee Latch.  Recall 10/18/2022 with EP APP.     Copy of ICM check sent to Dr. Elberta Fortis.     3 month ICM trend: 05/26/2022.    12-14 Month ICM trend:     Karie Soda, RN 05/28/2022 3:38 PM

## 2022-05-28 NOTE — Telephone Encounter (Signed)
Remote ICM transmission received.  Attempted call to patient regarding ICM remote transmission and no answer.  

## 2022-06-09 ENCOUNTER — Other Ambulatory Visit (HOSPITAL_COMMUNITY): Payer: Self-pay

## 2022-06-10 ENCOUNTER — Telehealth: Payer: Self-pay

## 2022-06-10 ENCOUNTER — Other Ambulatory Visit: Payer: Self-pay

## 2022-06-10 NOTE — Telephone Encounter (Signed)
Spoke with patient.  Advised that have received a few unscheduled remote transmissions that he initiated and asked if he is having any symptoms.  He stated he was sending the manual remote transmissions because the monitor was beeping and sending a report seems to stop the beeping.  Advised to call [employer] regarding the monitor beeping.  Explained sending frequent unscheduled remote transmission will affect the battery life of the device.  He has [employer] number and will call regarding the monitor.   He is feeling fine and denies he has any symptoms that warranted sending manual remote transmission.

## 2022-06-10 NOTE — Progress Notes (Signed)
   [name] 1965-09-03 [id number]  Patient outreached by Buddy Duty , PharmD Candidate on 06/10/2022.  Blood Pressure Readings: Last documented ambulatory systolic blood pressure: 146 Last documented ambulatory diastolic blood pressure: 84 Does the patient have a validated home blood pressure machine?: Yes (Needs to be calibrated)  Medication review was performed. Is the patient taking their medications as prescribed?: Yes   The following barriers to adherence were noted: Does the patient have cost concerns?: No Does the patient have transportation concerns?: No Does the patient need assistance obtaining refills?: No Does the patient occassionally forget to take some of their prescribed medications?: No Does the patient feel like one/some of their medications make them feel poorly?: No Does the patient have questions or concerns about their medications?: No Does the patient have a follow up scheduled with their primary care provider/cardiologist?: No (Patient is canceling the upcoming appointment.)   Interventions: Interventions Completed: Medications were reviewed, Patient was educated on proper technique to check home blood pressure and reminded to bring home machine and readings to next provider appointment  The patient has follow up scheduled:  PCP: Hoy Register, MD   Buddy Duty, Student-PharmD

## 2022-06-12 ENCOUNTER — Ambulatory Visit: Payer: Medicaid Other | Admitting: Family Medicine

## 2022-06-13 NOTE — Progress Notes (Signed)
Remote ICD transmission.   

## 2022-06-17 ENCOUNTER — Other Ambulatory Visit: Payer: Self-pay | Admitting: Family Medicine

## 2022-06-17 DIAGNOSIS — I426 Alcoholic cardiomyopathy: Secondary | ICD-10-CM

## 2022-06-17 DIAGNOSIS — G4709 Other insomnia: Secondary | ICD-10-CM

## 2022-06-18 NOTE — Progress Notes (Signed)
Advanced Heart Failure Clinic Note   PCP: Hoy Register, MD HF Cardiology: Dr. Shirlee Latch  HPI:  57 y.o. with history of nonischemic cardiomyopathy was referred by Eligha Bridegroom for evaluation of CHF.  Cardiomyopathy was diagnosed in 2016 when echo showed EF 20%.  He has a history of cocaine use and ETOH abuse. Currently, drinking about 3 beers/day. He is smoking. No recent cocaine. He had a stroke in 2017 in setting of uncontrolled HTN and cocaine abuse. He has a Secondary school teacher ICD. 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 Echo in 02/2021 showed EF < 20%, severe LV dilation, moderately decreased RV systolic function with mild RV enlargement. He has 13 siblings, none of whom have known cardiac disease. His grandmother had CHF and there apparently was conversation about a heart transplant but she passed away.    Follow up 09/25/21 with Dr. Shirlee Latch, NYHA II-III, volume stable. CPX, cMRI and evaluation for BAT arranged.    CPX (09/2021) showed severe functional limitation due to HF and deconditioning.    Follow up 09/2021, NYHA III-IIIb , labs showed AKI and GDMT held. Required a dose of Lokelma for hyperkalemia.   Cardiac MRI 10/2021 (difficult images due to ICD artifact) LVEF 24%, RVEF 34%.  He presented for follow [facility] to the [facility] on 04[facility]21[facility]22. Overall was feeling fine. Had a cough in the AM, cut back to 5 cigs/day. He noted he works at [employer] and has a car now. He was not short of breath with activity. Helped his brother build a deck recently with no dyspnea. Denied palpitations, CP, dizziness, edema, or PND/Orthopnea. Appetite was ok. No fever or chills. Weight at home was 145-147 pounds. Reported taking all medications. Drinks ETOH on weekend.    Today he returns to HF clinic for pharmacist medication titration. He wanted to be seen today to check and see if his home BP cuff is accurate. States BP at home have been high, ~150s/90s. BP in clinic 102/58 on left arm and 100/52 on right  arm. No dizziness but did note an episode of blurry vision on the way in to clinic today. However, he says that is common for him during the hot summer months. No CP. Does note "fluttering" in his chest in the mornings. No SOB/DOE. Weight has been stable. Takes Lasix 40 mg daily. He took an extra tablet of Lasix the last two days because he "felt fluid in his lungs when laying down". Stated the extra Lasix helped. No LEE. Gets pill packs from [employer]. Has graduated HF paramedicine program. Of note, he states he took two packets of Lokelma this past week for "cramps". He had this left over from a 30 day fill 09/2021 when K was 5.9. Lokelma has since been discontinued and taken off his med list. Educated him today about not taking Lokelma unless instructed by HF Clinic.    HF Medications: Metoprolol succinate 200 mg daily Entresto 97/103 mg BID Spironolactone 25 mg daily Farxiga 10 mg daily Digoxin 0.125 mg daily Lasix 40 mg daily  Has the patient been experiencing any side effects to the medications prescribed?  Notes cramps. States he took two packets of Lokelma this past week for "cramps". He had this left over from a 30 day fill 09/2021 when K was 5.9. Lokelma has since been discontinued and taken off his med list. Educated him today about not taking Lokelma unless instructed by HF Clinic. Taking Lokelma could actually makes cramps worse if potassium drops  too low.   Does the patient have any problems obtaining medications due to transportation or finances? [place] Medicaid Healthy Blue.    Understanding of regimen: excellent Understanding of indications: excellent Potential of compliance: excellent Patient understands to avoid NSAIDs. Patient understands to avoid decongestants.  Pertinent Lab Values: 04/21/22: Serum creatinine 1.11, BUN 10, Potassium 3.6, Sodium 139, Digoxin 0.4 ng/mL   Vital Signs:  Weight: 136.4 lbs (last clinic weight: 144.4 lbs) Blood pressure: 102/58 mmHg Heart  rate: 93   Assessment/Plan: 1. Chronic systolic CHF: Nonischemic cardiomyopathy.  Coronary CTA in 06/2021 with mild nonobstructive CAD. Cause of CMP uncertain => prior cocaine abuse but not currently. He drinks about 3 beers/day, denies heavier drinking in past but apparently there was concern that he had had ETOH withdrawal seizures in the past. His grandmother had CHF but apparently none of his 13 siblings have known cardiac disease. EF has been low since at least 2016. Echo in 02/2021 showed EF < 20%, severe LV dilation, moderately decreased RV systolic function with mild RV enlargement. He has a Secondary school teacher ICD. CPX 09/2021 showed severe functional limitation from both HF and deconditioning. Cardiac MRI 10/2021, technically difficult study due to ICD artifact, LVEF 24%, no evidence of infiltrative disease.  - NYHA class I-II symptoms. Appears euvolemic on exam today.  - Continue Lasix 40 mg daily - Continue metoprolol succinate 200 mg daily. - Continue Entresto 97/103 mg BID  - Continue spironolactone 25 mg daily. - Continue Farxiga 10 mg daily. - Continue digoxin 0.125 mg daily. - Bifurcation too high for [employer], working on getting Medicaid approval for commercial baroreceptor activation therapy.  - Per Dr. Shirlee Latch, not transplant candidate with on-going tobacco use.  Would likely be VAD candidate if needed.   2. COPD: He has cut back but is still smoking. Failed nicotine patches. Failed Chantix (stopped due to nightmares)  3. HTN: BP controlled at 102/58 today. Intolerant of BiDil due to HA. - Continue current regimen - Prescription for new BP cuff sent in today  Follow up with Dr. Shirlee Latch in 2 months    Karle Plumber, PharmD, BCPS, [facility], CPP Heart Failure Clinic Pharmacist [phone]

## 2022-06-19 ENCOUNTER — Ambulatory Visit (INDEPENDENT_AMBULATORY_CARE_PROVIDER_SITE_OTHER): Payer: Medicaid Other

## 2022-06-19 DIAGNOSIS — I428 Other cardiomyopathies: Secondary | ICD-10-CM

## 2022-06-19 LAB — CUP PACEART REMOTE DEVICE CHECK
Battery Remaining Longevity: 62 mo
Battery Remaining Percentage: 62 %
Battery Voltage: 2.96 V
Brady Statistic RV Percent Paced: 1 %
Date Time Interrogation Session: 20240530020016
HighPow Impedance: 70 Ohm
HighPow Impedance: 70 Ohm
Implantable Lead Connection Status: 753985
Implantable Lead Implant Date: 20191230
Implantable Lead Location: 753860
Implantable Lead Model: 7122
Implantable Pulse Generator Implant Date: 20191230
Lead Channel Impedance Value: 490 Ohm
Lead Channel Pacing Threshold Amplitude: 0.75 V
Lead Channel Pacing Threshold Pulse Width: 0.5 ms
Lead Channel Sensing Intrinsic Amplitude: 11.6 mV
Lead Channel Setting Pacing Amplitude: 2.5 V
Lead Channel Setting Pacing Pulse Width: 0.5 ms
Lead Channel Setting Sensing Sensitivity: 0.5 mV
Pulse Gen Serial Number: 9850980
Zone Setting Status: 755011

## 2022-06-24 ENCOUNTER — Ambulatory Visit (HOSPITAL_COMMUNITY)
Admission: RE | Admit: 2022-06-24 | Discharge: 2022-06-24 | Disposition: A | Discharge status: Home / Self Care | Payer: Medicaid Other | Source: Ambulatory Visit | Attending: Cardiology | Admitting: Cardiology

## 2022-06-24 VITALS — BP 102/58 | HR 93 | Wt 136.4 lb

## 2022-06-24 DIAGNOSIS — I5022 Chronic systolic (congestive) heart failure: Secondary | ICD-10-CM | POA: Diagnosis not present

## 2022-06-24 DIAGNOSIS — I2510 Atherosclerotic heart disease of native coronary artery without angina pectoris: Secondary | ICD-10-CM | POA: Diagnosis not present

## 2022-06-24 DIAGNOSIS — I110 Hypertensive heart disease with heart failure: Secondary | ICD-10-CM | POA: Diagnosis not present

## 2022-06-24 DIAGNOSIS — I428 Other cardiomyopathies: Secondary | ICD-10-CM | POA: Diagnosis not present

## 2022-06-24 MED ORDER — BLOOD PRESSURE CUFF MISC: 0 refills | Status: DC

## 2022-06-24 NOTE — Patient Instructions (Signed)
It was a pleasure seeing you today!  MEDICATIONS: -No medication changes today -New blood pressure cuff prescription was sent to [facility].  -Call if you have questions about your medications.   NEXT APPOINTMENT: Return to clinic in 2 months with Dr. Shirlee Latch.  In general, to take care of your heart failure: -Limit your fluid intake to 2 Liters (half-gallon) per day.   -Limit your salt intake to ideally 2-3 grams (2000-3000 mg) per day. -Weigh yourself daily and record, and bring that "weight diary" to your next appointment.  (Weight gain of 2-3 pounds in 1 day typically means fluid weight.) -The medications for your heart are to help your heart and help you live longer.   -Please contact us before stopping any of your heart medications.  Call the clinic at [phone] with questions or to reschedule future appointments.

## 2022-06-30 ENCOUNTER — Ambulatory Visit: Payer: Medicaid Other | Attending: Cardiology

## 2022-06-30 DIAGNOSIS — Z95810 Presence of automatic (implantable) cardiac defibrillator: Secondary | ICD-10-CM

## 2022-06-30 DIAGNOSIS — I5022 Chronic systolic (congestive) heart failure: Secondary | ICD-10-CM | POA: Diagnosis not present

## 2022-07-02 ENCOUNTER — Telehealth: Payer: Self-pay

## 2022-07-02 NOTE — Progress Notes (Signed)
EPIC Encounter for ICM Monitoring  Patient Name: [name] is a 57 y.o. male Date: 07/02/2022 Primary Care Physican: Hoy Register, MD Primary Cardiologist: Shirlee Latch Electrophysiologist: Elberta Fortis 12/02/2021 Weight: 145 lbs (baseline 140 lbs per patient)     12/03/2021 Weight: 147.8 lbs  12/09/2021 Weight: 140 lbs (baseline) 02/11/2022 Weight: 144 lbs (baseline 140-141 lbs) 02/17/2022 Weight: 142 lbs 03/19/2022 Weight: 147 lbs                                                             Attempted call to patient and unable to reach.  Transmission reviewed.    Diet:  Does not follow low salt diet   CorVue thoracic impedance suggesting intermittent days with possible fluid accumulation within the last month but currently normal fluid levels.   Prescribed:  Furosemide 20 mg take 2 tablets (40 mg total) by mouth daily (increased 12/26/21).  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     Labs: 04/21/2022 Creatinine 1.11, BUN 10, Potassium 3.6, Sodium 139, GFR >60  03/27/2022 Creatinine 0.96, BUN 11, Potassium 3.6, Sodium 134, GFR >60  02/06/2022 Creatinine 0.98, BUN 13, Potassium 3.5, Sodium 136, GFR >60  01/07/2022 Creatinine 1.24, BUN 24, Potassium 4.3, Sodium 136, GFR >60 A complete set of results can be found in Results Review.   Recommendations:  Unable to reach.     Follow-up plan: ICM clinic phone appointment on 08/04/2022.    91 day device clinic remote transmission 08/11/2022.     EP/Cardiology Office Visits: 08/22/2022 with Dr Shirlee Latch.  Recall 10/18/2022 with EP APP.     Copy of ICM check sent to Dr. Elberta Fortis.      3 month ICM trend: 06/30/2022.    12-14 Month ICM trend:     Karie Soda, RN 07/02/2022 8:20 AM

## 2022-07-02 NOTE — Telephone Encounter (Signed)
Remote ICM transmission received.  Attempted call to patient regarding ICM remote transmission and no answer or voice mail option.  

## 2022-07-08 DIAGNOSIS — I5022 Chronic systolic (congestive) heart failure: Secondary | ICD-10-CM | POA: Diagnosis not present

## 2022-07-08 NOTE — Progress Notes (Signed)
Remote ICD transmission.   

## 2022-08-04 ENCOUNTER — Ambulatory Visit: Payer: Medicaid Other | Attending: Cardiology

## 2022-08-04 DIAGNOSIS — I5022 Chronic systolic (congestive) heart failure: Secondary | ICD-10-CM

## 2022-08-04 DIAGNOSIS — Z95810 Presence of automatic (implantable) cardiac defibrillator: Secondary | ICD-10-CM | POA: Diagnosis not present

## 2022-08-10 NOTE — Progress Notes (Signed)
EPIC Encounter for ICM Monitoring  Patient Name: [name] is a 57 y.o. male Date: 08/10/2022 Primary Care Physican: Hoy Register, MD Primary Cardiologist: Shirlee Latch Electrophysiologist: Elberta Fortis 02/11/2022 Weight: 144 lbs (baseline 140-141 lbs) 02/17/2022 Weight: 142 lbs 03/19/2022 Weight: 147 lbs                                                             Transmission reviewed.    Diet:  Does not follow low salt diet   CorVue thoracic impedance suggesting normal fluid levels with the exception of possible fluid accumulation from 7/4-7/9.   Prescribed:  Furosemide 20 mg take 2 tablets (40 mg total) by mouth daily (increased 12/26/21).  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     Labs: 04/21/2022 Creatinine 1.11, BUN 10, Potassium 3.6, Sodium 139, GFR >60  03/27/2022 Creatinine 0.96, BUN 11, Potassium 3.6, Sodium 134, GFR >60  02/06/2022 Creatinine 0.98, BUN 13, Potassium 3.5, Sodium 136, GFR >60  01/07/2022 Creatinine 1.24, BUN 24, Potassium 4.3, Sodium 136, GFR >60 A complete set of results can be found in Results Review.   Recommendations:  No changes   Follow-up plan: ICM clinic phone appointment on 09/08/2022.    91 day device clinic remote transmission 11/10/2022.     EP/Cardiology Office Visits: 08/22/2022 with Dr Shirlee Latch.  Recall 10/18/2022 with EP APP.     Copy of ICM check sent to Dr. Elberta Fortis.    3 month ICM trend: 08/04/2022.    12-14 Month ICM trend:     Karie Soda, RN 08/10/2022 9:10 AM

## 2022-08-11 ENCOUNTER — Ambulatory Visit (INDEPENDENT_AMBULATORY_CARE_PROVIDER_SITE_OTHER): Payer: Medicaid Other

## 2022-08-11 DIAGNOSIS — I495 Sick sinus syndrome: Secondary | ICD-10-CM | POA: Diagnosis not present

## 2022-08-12 LAB — CUP PACEART REMOTE DEVICE CHECK
Battery Remaining Longevity: 61 mo
Battery Remaining Percentage: 60 %
Battery Voltage: 2.96 V
Brady Statistic RV Percent Paced: 1 %
Date Time Interrogation Session: 20240722020032
HighPow Impedance: 68 Ohm
HighPow Impedance: 68 Ohm
Implantable Lead Connection Status: 753985
Implantable Lead Implant Date: 20191230
Implantable Lead Location: 753860
Implantable Lead Model: 7122
Implantable Pulse Generator Implant Date: 20191230
Lead Channel Impedance Value: 450 Ohm
Lead Channel Pacing Threshold Amplitude: 0.75 V
Lead Channel Pacing Threshold Pulse Width: 0.5 ms
Lead Channel Sensing Intrinsic Amplitude: 11.6 mV
Lead Channel Setting Pacing Amplitude: 2.5 V
Lead Channel Setting Pacing Pulse Width: 0.5 ms
Lead Channel Setting Sensing Sensitivity: 0.5 mV
Pulse Gen Serial Number: 9850980
Zone Setting Status: 755011

## 2022-08-21 NOTE — Progress Notes (Signed)
Remote ICD transmission.   

## 2022-08-22 ENCOUNTER — Ambulatory Visit (HOSPITAL_COMMUNITY)
Admission: RE | Admit: 2022-08-22 | Discharge: 2022-08-22 | Disposition: A | Discharge status: Home / Self Care | Payer: Medicaid Other | Source: Ambulatory Visit | Attending: Cardiology | Admitting: Cardiology

## 2022-08-22 ENCOUNTER — Encounter (HOSPITAL_COMMUNITY): Payer: Self-pay | Admitting: Cardiology

## 2022-08-22 VITALS — BP 126/78 | HR 83 | Wt 138.8 lb

## 2022-08-22 DIAGNOSIS — I110 Hypertensive heart disease with heart failure: Secondary | ICD-10-CM | POA: Diagnosis not present

## 2022-08-22 DIAGNOSIS — I5022 Chronic systolic (congestive) heart failure: Secondary | ICD-10-CM | POA: Insufficient documentation

## 2022-08-22 DIAGNOSIS — Z8249 Family history of ischemic heart disease and other diseases of the circulatory system: Secondary | ICD-10-CM | POA: Insufficient documentation

## 2022-08-22 DIAGNOSIS — J449 Chronic obstructive pulmonary disease, unspecified: Secondary | ICD-10-CM | POA: Insufficient documentation

## 2022-08-22 DIAGNOSIS — I2510 Atherosclerotic heart disease of native coronary artery without angina pectoris: Secondary | ICD-10-CM | POA: Insufficient documentation

## 2022-08-22 DIAGNOSIS — Z8673 Personal history of transient ischemic attack (TIA), and cerebral infarction without residual deficits: Secondary | ICD-10-CM | POA: Diagnosis not present

## 2022-08-22 DIAGNOSIS — Z95810 Presence of automatic (implantable) cardiac defibrillator: Secondary | ICD-10-CM | POA: Diagnosis not present

## 2022-08-22 DIAGNOSIS — I5042 Chronic combined systolic (congestive) and diastolic (congestive) heart failure: Secondary | ICD-10-CM | POA: Diagnosis not present

## 2022-08-22 DIAGNOSIS — Z5986 Financial insecurity: Secondary | ICD-10-CM | POA: Insufficient documentation

## 2022-08-22 DIAGNOSIS — Z79899 Other long term (current) drug therapy: Secondary | ICD-10-CM | POA: Insufficient documentation

## 2022-08-22 DIAGNOSIS — Z5982 Transportation insecurity: Secondary | ICD-10-CM | POA: Insufficient documentation

## 2022-08-22 DIAGNOSIS — I428 Other cardiomyopathies: Secondary | ICD-10-CM | POA: Diagnosis not present

## 2022-08-22 DIAGNOSIS — F17210 Nicotine dependence, cigarettes, uncomplicated: Secondary | ICD-10-CM | POA: Diagnosis not present

## 2022-08-22 LAB — BASIC METABOLIC PANEL
Anion gap: 11 (ref 5–15)
BUN: 20 mg/dL (ref 6–20)
CO2: 21 mmol/L — ABNORMAL LOW (ref 22–32)
Calcium: 10 mg/dL (ref 8.9–10.3)
Chloride: 100 mmol/L (ref 98–111)
Creatinine, Ser: 1.26 mg/dL — ABNORMAL HIGH (ref 0.61–1.24)
GFR, Estimated: >60 mL/min (ref >60)
Glucose, Bld: 92 mg/dL (ref 70–99)
Potassium: 4.9 mmol/L (ref 3.5–5.1)
Sodium: 132 mmol/L — ABNORMAL LOW (ref 135–145)

## 2022-08-22 LAB — BRAIN NATRIURETIC PEPTIDE: B Natriuretic Peptide: 84.9 pg/mL (ref 0.0–100.0)

## 2022-08-22 LAB — LIPID PANEL
Cholesterol: 164 mg/dL (ref 0–200)
HDL: 97 mg/dL (ref >40)
LDL Cholesterol: 50 mg/dL (ref 0–99)
Total CHOL/HDL Ratio: 1.7 RATIO
Triglycerides: 85 mg/dL (ref <150)
VLDL: 17 mg/dL (ref 0–40)

## 2022-08-22 LAB — DIGOXIN LEVEL: Digoxin Level: 1 ng/mL (ref 0.8–2.0)

## 2022-08-22 MED ORDER — BUPROPION HCL ER (SR) 150 MG PO TB12: 150.0000 mg | ORAL_TABLET | Freq: Two times a day (BID) | ORAL | 3 refills | Status: DC

## 2022-08-22 NOTE — Patient Instructions (Signed)
START Wellbutruin 150 mg Twice daily  Labs done today, your results will be available in MyChart, we will contact you for abnormal readings.  Your physician has requested that you have an echocardiogram. Echocardiography is a painless test that uses sound waves to create images of your heart. It provides your doctor with information about the size and shape of your heart and how well your heart's chambers and valves are working. This procedure takes approximately one hour. There are no restrictions for this procedure. Please do NOT wear cologne, perfume, aftershave, or lotions (deodorant is allowed). Please arrive 15 minutes prior to your appointment time.  You have been referred to Dr. Myra Gianotti. His office will call you to arrange your appointment.   Your physician recommends that you schedule a follow-up appointment in: 4 months.  If you have any questions or concerns before your next appointment please send [name] a message through [place] or call our office at [phone].    TO LEAVE A MESSAGE FOR THE NURSE SELECT OPTION 2, PLEASE LEAVE A MESSAGE INCLUDING: YOUR NAME DATE OF BIRTH CALL BACK NUMBER REASON FOR CALL**this is important as we prioritize the call backs  YOU WILL RECEIVE A CALL BACK THE SAME DAY AS LONG AS YOU CALL BEFORE 4:00 PM  At the [facility], you and your health needs are our priority. As part of our continuing mission to provide you with exceptional heart care, we have created designated Provider Care Teams. These Care Teams include your primary Cardiologist (physician) and Advanced Practice Providers (APPs- Physician Assistants and Nurse Practitioners) who all work together to provide you with the care you need, when you need it.   You may see any of the following providers on your designated Care Team at your next follow up: Dr Arvilla Meres Dr Marca Ancona Dr. Marcos Eke, NP Robbie Lis, [place] [facility] [place], [place] Brynda Peon, NP Karle Plumber, PharmD   Please be sure to bring in all your medications bottles to every appointment.    Thank you for choosing [facility]

## 2022-08-23 NOTE — Progress Notes (Addendum)
[facility] Progress Note    PCP: Hoy Register, MD HF Cardiology: Dr. Shirlee Latch  57 y.o. with history of nonischemic cardiomyopathy was referred by Eligha Bridegroom for evaluation of CHF.  Cardiomyopathy has been diagnosed since 2016 when echo showed EF 20%.  He has a history of cocaine use and ETOH abuse. Currently, drinking about 3 beers/day.  He is smoking.  No recent cocaine.  He had a stroke in 2017 in setting of uncontrolled HTN and cocaine abuse.  He has a Secondary school teacher ICD.  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  Last echo in 2/23 showed EF < 20%, severe LV dilation, moderately decreased RV systolic function with mild RV enlargement. He has 13 siblings, none of whom have known cardiac disease.  His grandmother had CHF and there apparently was conversation about a heart transplant but she passed away.  CPX October 14, 2022) showed severe functional limitation due to HF and deconditioning.   Cardiac MRI 10/23 (difficult images due to ICD artifact) LVEF 24%, RVEF 34%.  He returns for followup of CHF.  Doing well symptomatically. No dyspnea walking on flat ground. He does get short of breath walking up hills and stairs and most moderate exertion. No orthopnea/PND.  No chest pain. No lightheadedness or palpitations.  Smoking 1/2 ppd.  He drinks 1-2 beers/day.  Weight down 8 lbs.    St Jude device interrogation: No VT, stable thoracic impedance.   ECG (personally reviewed): NSR, LVH with repolarization abnormality.   Labs (6/23): hgb 15.6, BNP 673, K 4.2, creatinine 1.23 Labs (8/23): K 3.9, creatinine 1.13 Labs 10/14/22): K 4.8, creatinine 1.53 Labs (10/23): K 3.6, creatinine 1.07 Labs (11/23): K 4.3, creatinine 1.4, BNP 152 Labs (5/23): 4.3, creatinine 1.24 Lab (12/23): K 4.3, creatinine 1.24  Labs (3/24): K 3.6, creatinine 0.96 Labs (4/24): BNP 488, K 3.6, creatinine 1.11  PMH: 1. H/o seizure disorder: ?ETOH. 2. HTN 3. 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   4. H/o SVT 5. OSA 6. COPD: Active smoker 7. VT: St Jude ICD.  ATP 6/23.  8. Chronic systolic CHF: Nonischemic cardiomyopathy.  Documented since at least 2016. St Jude ICD.  - Echo (3/16): EF 20% - Echo (2/23): EF < 20%, severe LV dilation, moderately decreased RV systolic function with mild RV enlargement.  - Coronary CTA (6/23): 81st percentile calcium score, mild nonobstructive CAD.  - CPX 14-Oct-2022): Peak VO2: 14.3 (43% predicted peak VO2), VE/VCO2 slope: 39, OUES: 1.10, Peak RER: 0.70  - Cardiac MRI 10/23 (difficult images due to ICD artifact) LVEF 24%, RVEF 34%. 9. Prior h/o cocaine  Social History   Socioeconomic History   Marital status: Single    Spouse name: Not on file   Number of children: 1   Years of education: 12   Highest education level: Not on file  Occupational History   Not on file  Tobacco Use   Smoking status: Every Day    Current packs/day: 0.50    Average packs/day: 0.5 packs/day for 20.0 years (10.0 ttl pk-yrs)    Types: Cigarettes   Smokeless tobacco: Never   Tobacco comments:    02/25/22 down to 1/2 pack a day  Vaping Use   Vaping status: Never Used  Substance and Sexual Activity   Alcohol use: Yes    Alcohol/week: 1.0 standard drink of alcohol    Types: 1 Cans of beer per week    Comment: socially    Drug use: Not Currently    Types: Cocaine  Comment: stopped using cocaine 11/19/14   Sexual activity: Not on file  Other Topics Concern   Not on file  Social History Narrative   Lives alone, nurse comes 2 hrs daily   Social Determinants of Health   Financial Resource Strain: High Risk (11/14/2021)   Overall Financial Resource Strain (CARDIA)    Difficulty of Paying Living Expenses: Hard  Food Insecurity: Food Insecurity Present (12/24/2021)   Hunger Vital Sign    Worried About Programme researcher, broadcasting/film/video in the Last Year: Sometimes true    Ran Out of Food in the Last Year: Sometimes true  Transportation Needs: Unmet Transportation Needs (02/25/2022)    PRAPARE - Administrator, Civil Service (Medical): Yes    Lack of Transportation (Non-Medical): Yes  Physical Activity: Not on file  Stress: Not on file  Social Connections: Not on file  Intimate Partner Violence: Not on file   Family History  Problem Relation Age of Onset   Hypertension Mother    Hyperlipidemia Mother    Hypertension Father    Stroke Maternal Aunt    Hypertension Sister    Hypertension Brother    ROS: All systems reviewed and negative except as per HPI.   Current Outpatient Medications  Medication Sig Dispense Refill   albuterol (PROAIR HFA) 108 (90 Base) MCG/ACT inhaler Inhale 2 puffs into the lungs every 6 (six) hours as needed for shortness of breath. 18 g 6   aspirin 81 MG EC tablet Take 81 mg by mouth in the morning. Swallow whole.     atorvastatin (LIPITOR) 40 MG tablet TAKE 1 TABLET (40 MG TOTAL) BY MOUTH DAILY (AM) 90 tablet 0   Blood Pressure Monitoring (BLOOD PRESSURE CUFF) MISC Take daily blood pressures 1 each 0   buPROPion (WELLBUTRIN SR) 150 MG 12 hr tablet Take 1 tablet (150 mg total) by mouth 2 (two) times daily. 180 tablet 3   dapagliflozin propanediol (FARXIGA) 10 MG TABS tablet Take 1 tablet (10 mg total) by mouth daily before breakfast. 30 tablet 11   digoxin (LANOXIN) 0.125 MG tablet Take 1 tablet (0.125 mg total) by mouth daily. 90 tablet 1   fluticasone-salmeterol (ADVAIR DISKUS) 250-50 MCG/ACT AEPB Inhale 1 puff into the lungs in the morning and at bedtime. 1 each 3   furosemide (LASIX) 20 MG tablet Take 2 tablets (40 mg total) by mouth daily. Take 2 tablets (40 mg total) every day. 180 tablet 2   MAGNESIUM OXIDE -MG SUPPLEMENT PO Take 1 tablet by mouth in the morning.     metoprolol (TOPROL XL) 200 MG 24 hr tablet Take 1 tablet (200 mg total) by mouth daily. 30 tablet 11   mirtazapine (REMERON SOL-TAB) 15 MG disintegrating tablet TAKE 1 TABLET (15 MG TOTAL) BY MOUTH AT BEDTIME. 90 tablet 0   potassium chloride SA (KLOR-CON M) 20  MEQ tablet Take 1 tablet (20 mEq total) by mouth daily. For 3 days only 3 tablet 0   sacubitril-valsartan (ENTRESTO) 97-103 MG Take 1 tablet by mouth 2 (two) times daily. 60 tablet 11   spironolactone (ALDACTONE) 25 MG tablet Take 1 tablet (25 mg total) by mouth at bedtime. 90 tablet 3   tiotropium (SPIRIVA HANDIHALER) 18 MCG inhalation capsule Place 1 capsule (18 mcg total) into inhaler and inhale daily. 30 capsule 6   No current facility-administered medications for this encounter.   Wt Readings from Last 3 Encounters:  08/22/22 63 kg (138 lb 12.8 oz)  06/24/22 61.9 kg (136  lb 6.4 oz)  04/21/22 65.5 kg (144 lb 6.4 oz)   BP 126/78   Pulse 83   Wt 63 kg (138 lb 12.8 oz)   SpO2 98%   BMI 20.20 kg/m  General: NAD Neck: No JVD, no thyromegaly or thyroid nodule.  Lungs: Clear to auscultation bilaterally with normal respiratory effort. CV: Nondisplaced PMI.  Heart regular S1/S2, no S3/S4, no murmur.  No peripheral edema.  No carotid bruit.  Normal pedal pulses.  Abdomen: Soft, nontender, no hepatosplenomegaly, no distention.  Skin: Intact without lesions or rashes.  Neurologic: Alert and oriented x 3.  Psych: Normal affect. Extremities: No clubbing or cyanosis.  HEENT: Normal.   Assessment/Plan: 1. Chronic systolic CHF: Nonischemic cardiomyopathy.  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  Cause of CMP uncertain => prior cocaine abuse but not currently.  He drinks about 3 beers/day, denies heavier drinking in past but apparently there was concern that he had had ETOH withdrawal seizures in the past.  His grandmother had CHF but apparently none of his 13 siblings have known cardiac disease. EF has been low since at least 2016.  Most recent echo in 2/23 showed EF < 20%, severe LV dilation, moderately decreased RV systolic function with mild RV enlargement. He has a Secondary school teacher ICD.  CPX 9/23 showed severe functional limitation from both HF and deconditioning. Cardiac MRI 10/23,  technically difficult study due to ICD artifact, LVEF 24%, no evidence of infiltrative disease. NYHA class III symptoms chronically. He is not volume overloaded by exam or Corvue.  - Continue Lasix 40 mg daily, BMET/BNP today.  - Continue Farxiga 10 mg daily. - Continue Entresto 97/103 mg bid.  - Continue digoxin 0.125 mg daily. Check digoxin level today. - Continue Toprol XL 200 mg daily - Continue spironolactone 25 mg daily.   - Keep ETOH minimal. - Refer to Dr. Myra Gianotti for baroroceptor activation therapy. We discussed the device again today and patient remains interested.   - We have discussed advanced therapies. Not transplant candidate with on-going tobacco use.  Would likely be VAD candidate if needed.  - I will arrange for repeat echo.  2. COPD: He has cut back but is still smoking.  Failed nicotine patches. Failed Chantix (stopped due to nightmares)  - I am going to try him on wellbutrin for smoking cessation.  3. HTN: BP controlled.   Followup in 4 months.   Marca Ancona,  08/23/2022

## 2022-08-25 ENCOUNTER — Ambulatory Visit: Payer: Medicaid Other | Admitting: Family Medicine

## 2022-08-25 ENCOUNTER — Telehealth (HOSPITAL_COMMUNITY): Payer: Self-pay

## 2022-08-25 DIAGNOSIS — I5041 Acute combined systolic (congestive) and diastolic (congestive) heart failure: Secondary | ICD-10-CM

## 2022-08-25 DIAGNOSIS — I5042 Chronic combined systolic (congestive) and diastolic (congestive) heart failure: Secondary | ICD-10-CM

## 2022-08-25 NOTE — Telephone Encounter (Addendum)
Pt aware, agreeable, and verbalized understanding Patient aware of not taking Digoxin am of lab appointment  ----- Message from Marca Ancona sent at 08/22/2022  4:54 PM EDT ----- Good lipids.  Would repeat digoxin level as trough (before am digoxin dose).

## 2022-08-29 ENCOUNTER — Other Ambulatory Visit (HOSPITAL_COMMUNITY): Payer: Medicaid Other

## 2022-09-01 ENCOUNTER — Ambulatory Visit (INDEPENDENT_AMBULATORY_CARE_PROVIDER_SITE_OTHER): Payer: Medicaid Other | Admitting: Surgery

## 2022-09-01 ENCOUNTER — Encounter: Payer: Self-pay | Admitting: Surgery

## 2022-09-01 VITALS — BP 97/63 | HR 74 | Temp 98.1°F | Wt 135.0 lb

## 2022-09-01 DIAGNOSIS — I5042 Chronic combined systolic (congestive) and diastolic (congestive) heart failure: Secondary | ICD-10-CM

## 2022-09-01 DIAGNOSIS — I5030 Unspecified diastolic (congestive) heart failure: Secondary | ICD-10-CM

## 2022-09-01 NOTE — Progress Notes (Signed)
[facility] [place]  Patient name: [name] MRN: [id number] DOB: [birth year] Sex: male   REQUESTING PROVIDER:    Dr. Shirlee Latch   REASON FOR CONSULT:    Barostim evaluation  HISTORY OF PRESENT ILLNESS:   [name] is a 57 y.o. male, who is referred for Barostim evaluation.  The patient suffers from nonischemic cardiomyopathy.  He has NYHA  class III symptoms.  He is on goal-directed medical therapy.  He has a Retail buyer ICD in place.  His most recent ejection fraction was 24%.  His activity is limited by shortness of breath and fatigue.  He has trouble getting up steps.  Patient suffers from COPD.  He continues to smoke.  He has tried Chantix but had to stop secondary to nightmares.  He is medically managed for hypertension.  PAST MEDICAL HISTORY    Past Medical History:  Diagnosis Date   Accelerated hypertension 12/06/2014   CHF (congestive heart failure) (HCC)    CHF exacerbation (HCC) 12/22/2016   Cocaine abuse (HCC)    Headache    Hypertension    Stroke (HCC) 02/24/2015   SVT (supraventricular tachycardia) 06/15/2020   Thrombocytopenia (HCC) 07/31/2017     FAMILY HISTORY   Family History  Problem Relation Age of Onset   Hypertension Mother    Hyperlipidemia Mother    Hypertension Father    Stroke Maternal Aunt    Hypertension Sister    Hypertension Brother     SOCIAL HISTORY:   Social History   Socioeconomic History   Marital status: Single    Spouse name: Not on file   Number of children: 1   Years of education: 12   Highest education level: Not on file  Occupational History   Not on file  Tobacco Use   Smoking status: Every Day    Current packs/day: 0.50    Average packs/day: 0.5 packs/day for 20.0 years (10.0 ttl pk-yrs)    Types: Cigarettes   Smokeless tobacco: Never   Tobacco comments:    02/25/22 down to 1/2 pack a day  Vaping Use   Vaping status: Never Used  Substance and  Sexual Activity   Alcohol use: Yes    Alcohol/week: 1.0 standard drink of alcohol    Types: 1 Cans of beer per week    Comment: socially    Drug use: Not Currently    Types: Cocaine    Comment: stopped using cocaine 11/19/14   Sexual activity: Not on file  Other Topics Concern   Not on file  Social History Narrative   Lives alone, nurse comes 2 hrs daily   Social Determinants of Health   Financial Resource Strain: High Risk (11/14/2021)   Overall Financial Resource Strain (CARDIA)    Difficulty of Paying Living Expenses: Hard  Food Insecurity: Food Insecurity Present (12/24/2021)   Hunger Vital Sign    Worried About Running Out of Food in the Last Year: Sometimes true    Ran Out of Food in the Last Year: Sometimes true  Transportation Needs: Unmet Transportation Needs (02/25/2022)   PRAPARE - Administrator, Civil Service (Medical): Yes    Lack of Transportation (Non-Medical): Yes  Physical Activity: Not on file  Stress: Not on file  Social Connections: Not on file  Intimate Partner Violence: Not on file    ALLERGIES:    Allergies  Allergen Reactions   Penicillins Other (See Comments)    Blisters  Has patient had  a PCN reaction causing immediate rash, facial/tongue/throat swelling, SOB or lightheadedness with hypotension: Yes Has patient had a PCN reaction causing severe rash involving mucus membranes or skin necrosis: Yes Has patient had a PCN reaction that required hospitalization: No Has patient had a PCN reaction occurring within the last 10 years: No If all of the above answers are "NO", then may proceed with Cephalosporin use.     CURRENT MEDICATIONS:    Current Outpatient Medications  Medication Sig Dispense Refill   albuterol (PROAIR HFA) 108 (90 Base) MCG/ACT inhaler Inhale 2 puffs into the lungs every 6 (six) hours as needed for shortness of breath. 18 g 6   aspirin 81 MG EC tablet Take 81 mg by mouth in the morning. Swallow whole.      atorvastatin (LIPITOR) 40 MG tablet TAKE 1 TABLET (40 MG TOTAL) BY MOUTH DAILY (AM) 90 tablet 0   Blood Pressure Monitoring (BLOOD PRESSURE CUFF) MISC Take daily blood pressures 1 each 0   buPROPion (WELLBUTRIN SR) 150 MG 12 hr tablet Take 1 tablet (150 mg total) by mouth 2 (two) times daily. 180 tablet 3   dapagliflozin propanediol (FARXIGA) 10 MG TABS tablet Take 1 tablet (10 mg total) by mouth daily before breakfast. 30 tablet 11   digoxin (LANOXIN) 0.125 MG tablet Take 1 tablet (0.125 mg total) by mouth daily. 90 tablet 1   fluticasone-salmeterol (ADVAIR DISKUS) 250-50 MCG/ACT AEPB Inhale 1 puff into the lungs in the morning and at bedtime. 1 each 3   furosemide (LASIX) 20 MG tablet Take 2 tablets (40 mg total) by mouth daily. Take 2 tablets (40 mg total) every day. 180 tablet 2   MAGNESIUM OXIDE -MG SUPPLEMENT PO Take 1 tablet by mouth in the morning.     metoprolol (TOPROL XL) 200 MG 24 hr tablet Take 1 tablet (200 mg total) by mouth daily. 30 tablet 11   mirtazapine (REMERON SOL-TAB) 15 MG disintegrating tablet TAKE 1 TABLET (15 MG TOTAL) BY MOUTH AT BEDTIME. 90 tablet 0   potassium chloride SA (KLOR-CON M) 20 MEQ tablet Take 1 tablet (20 mEq total) by mouth daily. For 3 days only 3 tablet 0   sacubitril-valsartan (ENTRESTO) 97-103 MG Take 1 tablet by mouth 2 (two) times daily. 60 tablet 11   spironolactone (ALDACTONE) 25 MG tablet Take 1 tablet (25 mg total) by mouth at bedtime. 90 tablet 3   tiotropium (SPIRIVA HANDIHALER) 18 MCG inhalation capsule Place 1 capsule (18 mcg total) into inhaler and inhale daily. 30 capsule 6   No current facility-administered medications for this visit.    REVIEW OF SYSTEMS:   [X]  denotes positive finding, [ ]  denotes negative finding Cardiac  Comments:  Chest pain or chest pressure:    Shortness of breath upon exertion:    Short of breath when lying flat:    Irregular heart rhythm:        Vascular    Pain in calf, thigh, or hip brought on by  ambulation:    Pain in feet at night that wakes you up from your sleep:     Blood clot in your veins:    Leg swelling:         Pulmonary    Oxygen at home:    Productive cough:     Wheezing:         Neurologic    Sudden weakness in arms or legs:     Sudden numbness in arms or legs:  Sudden onset of difficulty speaking or slurred speech:    Temporary loss of vision in one eye:     Problems with dizziness:         Gastrointestinal    Blood in stool:      Vomited blood:         Genitourinary    Burning when urinating:     Blood in urine:        Psychiatric    Major depression:         Hematologic    Bleeding problems:    Problems with blood clotting too easily:        Skin    Rashes or ulcers:        Constitutional    Fever or chills:     PHYSICAL EXAM:   Vitals:   09/01/22 1149  BP: 97/63  Pulse: 74  Temp: 98.1 F (36.7 C)  TempSrc: Temporal  SpO2: 93%  Weight: 135 lb (61.2 kg)    GENERAL: The patient is a well-nourished male, in no acute distress. The vital signs are documented above. CARDIAC: There is a regular rate and rhythm.  VASCULAR: I evaluated his carotid artery with SonoSite.  There is no significant calcification.  The carotid bifurcation is just above the mid neck PULMONARY: Nonlabored respirations ABDOMEN: Soft and non-tender with normal pitched bowel sounds.  MUSCULOSKELETAL: There are no major deformities or cyanosis. NEUROLOGIC: No focal weakness or paresthesias are detected. SKIN: There are no ulcers or rashes noted. PSYCHIATRIC: The patient has a normal affect.  STUDIES:   I have reviewed his carotid ultrasound with the following findings: Right Carotid: Velocities in the right ICA are consistent with a 1-39%  stenosis.   Left Carotid: Velocities in the left ICA are consistent with a 1-39%  stenosis.   Vertebrals: Bilateral vertebral arteries demonstrate antegrade flow.  Subclavians: Normal flow hemodynamics were seen in  bilateral subclavian               arteries.   ASSESSMENT and PLAN   I discussed with the patient that he is on goal-directed medical therapy for his heart failure and has persistent symptoms.  His activity is limited by fatigue and shortness of breath.  I think he would be an excellent candidate for Barostim replacement therapy, which has been requested by his heart failure team.  This would be on the right side.  He has a Retail buyer ICD on the left.  I discussed the details of the operation as well as risks and benefits and he wants to proceed.  I will work on Therapist, occupational and get him scheduled in the near future.   Charlena Cross, MD, FACS [facility] [phone] Pager [phone]

## 2022-09-02 NOTE — Addendum Note (Signed)
Encounter addended by: Laurey Morale, MD on: 09/02/2022 11:46 PM  Actions taken: Clinical Note Signed

## 2022-09-03 ENCOUNTER — Other Ambulatory Visit (HOSPITAL_COMMUNITY): Payer: Medicaid Other

## 2022-09-08 ENCOUNTER — Ambulatory Visit: Payer: Medicaid Other | Attending: Cardiology

## 2022-09-08 DIAGNOSIS — I5042 Chronic combined systolic (congestive) and diastolic (congestive) heart failure: Secondary | ICD-10-CM | POA: Diagnosis not present

## 2022-09-08 DIAGNOSIS — Z95810 Presence of automatic (implantable) cardiac defibrillator: Secondary | ICD-10-CM | POA: Diagnosis not present

## 2022-09-10 ENCOUNTER — Ambulatory Visit (HOSPITAL_COMMUNITY)
Admission: RE | Admit: 2022-09-10 | Discharge: 2022-09-10 | Disposition: A | Discharge status: Home / Self Care | Payer: Medicaid Other | Source: Ambulatory Visit | Attending: Internal Medicine | Admitting: Internal Medicine

## 2022-09-10 ENCOUNTER — Telehealth (HOSPITAL_COMMUNITY): Payer: Self-pay

## 2022-09-10 DIAGNOSIS — I5042 Chronic combined systolic (congestive) and diastolic (congestive) heart failure: Secondary | ICD-10-CM | POA: Insufficient documentation

## 2022-09-10 DIAGNOSIS — I5022 Chronic systolic (congestive) heart failure: Secondary | ICD-10-CM

## 2022-09-10 LAB — DIGOXIN LEVEL: Digoxin Level: 0.5 ng/mL — ABNORMAL LOW (ref 0.8–2.0)

## 2022-09-10 NOTE — Telephone Encounter (Signed)
-----   Message from Marca Ancona sent at 09/10/2022  1:33 PM EDT ----- Digoxin level ok

## 2022-09-10 NOTE — Telephone Encounter (Signed)
Spoke with patient regarding the following results. Patient made aware and patient verbalized understanding.   Patient reports that he has been having some cramping at night in his legs and he has to wake up and walk to get some relief.   Patient reports that he is not taking a potassium supplement, he is also not taking spiro. Last potassium level 08/22/22 was 4.9  Will forward to provider for review and advice.

## 2022-09-10 NOTE — Telephone Encounter (Signed)
He should be taking spironolactone 25 mg daily.  Why is he off? Should restart.  He should NOT be taking a potassium supplement and should be following low K diet. Restart spironolactone and get BMET in 1 week.

## 2022-09-11 ENCOUNTER — Telehealth: Payer: Self-pay

## 2022-09-11 NOTE — Telephone Encounter (Signed)
Remote ICM transmission received.  Attempted call to patient regarding ICM remote transmission and no answer.  

## 2022-09-11 NOTE — Progress Notes (Signed)
EPIC Encounter for ICM Monitoring  Patient Name: [name] is a 57 y.o. male Date: 09/11/2022 Primary Care Physican: Hoy Register, MD Primary Cardiologist: Shirlee Latch Electrophysiologist: Elberta Fortis 02/17/2022 Weight: 142 lbs 08/22/2022 Office Weight: 138 lbs                                                             Attempted call to patient and unable to reach.   Transmission reviewed.    Diet:  Does not follow low salt diet   CorVue thoracic impedance suggesting normal fluid levels.   Prescribed:  Furosemide 20 mg take 2 tablets (40 mg total) by mouth daily (increased 12/26/21).  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     Labs: 09/18/2022 BMET Scheduled at HF clinic 08/22/2022 Creatinine 1.26, BUN 20, Potassium 4.9, Sodium 132, GFR >60 04/21/2022 Creatinine 1.11, BUN 10, Potassium 3.6, Sodium 139, GFR >60  03/27/2022 Creatinine 0.96, BUN 11, Potassium 3.6, Sodium 134, GFR >60  02/06/2022 Creatinine 0.98, BUN 13, Potassium 3.5, Sodium 136, GFR >60  01/07/2022 Creatinine 1.24, BUN 24, Potassium 4.3, Sodium 136, GFR >60 A complete set of results can be found in Results Review.   Recommendations:  Unable to reach.     Follow-up plan: ICM clinic phone appointment on 10/13/2022.    91 day device clinic remote transmission 11/10/2022.     EP/Cardiology Office Visits: 12/23/2022 with HF clinic.  Recall 10/18/2022 with EP APP.     Copy of ICM check sent to Dr. Elberta Fortis.    3 month ICM trend: 09/08/2022.    12-14 Month ICM trend:     Karie Soda, RN 09/11/2022 1:48 PM

## 2022-09-11 NOTE — Addendum Note (Signed)
Addended by: Bea Laura B on: 09/11/2022 09:27 AM   Modules accepted: Orders

## 2022-09-11 NOTE — Telephone Encounter (Addendum)
Patient unsure of whether he is taking spironolactone or not. Patient reports that it may have been taken out of his bubble packs. Advised patient I would check with pharmacy to ensure that this is included in daily medications   [facility]oke [facility] and confirmed that patient is receiving Spironolactone 25mg  daily in his bubble pack.   Patient will follow low potassium diet, and confirmed that patient is not receiving potassium supplement (medication list updated)   Repeat labs ordered and patient will return next week as scheduled.   Advised patient to call back to office with any issues, questions, or concerns. Patient verbalized understanding.

## 2022-09-15 ENCOUNTER — Other Ambulatory Visit (HOSPITAL_COMMUNITY): Payer: Self-pay | Admitting: Family Medicine

## 2022-09-18 ENCOUNTER — Ambulatory Visit (HOSPITAL_COMMUNITY)
Admission: RE | Admit: 2022-09-18 | Discharge: 2022-09-18 | Disposition: A | Discharge status: Home / Self Care | Payer: Medicaid Other | Source: Ambulatory Visit | Attending: Cardiology | Admitting: Cardiology

## 2022-09-18 DIAGNOSIS — I5022 Chronic systolic (congestive) heart failure: Secondary | ICD-10-CM | POA: Diagnosis not present

## 2022-09-18 LAB — BASIC METABOLIC PANEL
Anion gap: 15 (ref 5–15)
BUN: 16 mg/dL (ref 6–20)
CO2: 22 mmol/L (ref 22–32)
Calcium: 9.6 mg/dL (ref 8.9–10.3)
Chloride: 98 mmol/L (ref 98–111)
Creatinine, Ser: 1.28 mg/dL — ABNORMAL HIGH (ref 0.61–1.24)
GFR, Estimated: >60 mL/min (ref >60)
Glucose, Bld: 102 mg/dL — ABNORMAL HIGH (ref 70–99)
Potassium: 3.7 mmol/L (ref 3.5–5.1)
Sodium: 135 mmol/L (ref 135–145)

## 2022-09-24 ENCOUNTER — Ambulatory Visit (HOSPITAL_COMMUNITY)
Admission: RE | Admit: 2022-09-24 | Discharge: 2022-09-24 | Disposition: A | Discharge status: Home / Self Care | Payer: Medicaid Other | Source: Ambulatory Visit | Attending: Family Medicine | Admitting: Family Medicine

## 2022-09-24 DIAGNOSIS — I110 Hypertensive heart disease with heart failure: Secondary | ICD-10-CM | POA: Diagnosis not present

## 2022-09-24 DIAGNOSIS — Z8673 Personal history of transient ischemic attack (TIA), and cerebral infarction without residual deficits: Secondary | ICD-10-CM | POA: Diagnosis not present

## 2022-09-24 DIAGNOSIS — I071 Rheumatic tricuspid insufficiency: Secondary | ICD-10-CM | POA: Diagnosis not present

## 2022-09-24 DIAGNOSIS — I5042 Chronic combined systolic (congestive) and diastolic (congestive) heart failure: Secondary | ICD-10-CM | POA: Diagnosis not present

## 2022-09-24 DIAGNOSIS — I471 Supraventricular tachycardia, unspecified: Secondary | ICD-10-CM | POA: Insufficient documentation

## 2022-09-24 MED ORDER — PERFLUTREN LIPID MICROSPHERE
1.0000 mL | INTRAVENOUS | Status: AC | PRN
  Administered 2022-09-24: 6 mL via INTRAVENOUS

## 2022-09-25 LAB — ECHOCARDIOGRAM COMPLETE
Area-P 1/2: 2.66 cm2
Calc EF: 28.6 %
Est EF: 25
S' Lateral: 5 cm
Single Plane A2C EF: 29.2 %
Single Plane A4C EF: 28.3 %

## 2022-09-29 ENCOUNTER — Telehealth (HOSPITAL_COMMUNITY): Payer: Self-pay | Admitting: *Deleted

## 2022-09-29 NOTE — Telephone Encounter (Signed)
Called patient per Dr. Shirlee Latch with following echo results:  "EF 25%, similar to prior."  Pt verbalized understanding of same. Confirmed follow up in HF App clinic in December. Asked patient to call us at [phone] if any questions or issues prior to that visit.

## 2022-10-13 ENCOUNTER — Ambulatory Visit: Payer: Medicaid Other | Attending: Cardiology

## 2022-10-13 DIAGNOSIS — I5042 Chronic combined systolic (congestive) and diastolic (congestive) heart failure: Secondary | ICD-10-CM

## 2022-10-13 DIAGNOSIS — Z95810 Presence of automatic (implantable) cardiac defibrillator: Secondary | ICD-10-CM

## 2022-10-15 NOTE — Progress Notes (Signed)
EPIC Encounter for ICM Monitoring  Patient Name: [name] is a 57 y.o. male Date: 10/15/2022 Primary Care Physican: Hoy Register, MD Primary Cardiologist: Shirlee Latch Electrophysiologist: Elberta Fortis 02/17/2022 Weight: 142 lbs 08/22/2022 Office Weight: 138 lbs  10/15/2022 Weight:  139 lbs                                                            Spoke with patient and heart failure questions reviewed.  Transmission results reviewed.  Pt asymptomatic for fluid accumulation.  Reports feeling well at this time and voices no complaints.  He felt congested during decreased impedance and took extra Lasix pill.     Diet:  Does not follow low salt diet.  Reports 9/25 he has been eating at [facility] several times.    CorVue thoracic impedance suggesting normal fluid levels with the exception of possible fluid accumulation from 9/3-9/15.   Prescribed:  Furosemide 20 mg take 2 tablets (40 mg total) by mouth daily  Spironolactone 25 mg take 0.5 tablet by mouth at bedtime  Pt being followed by Maralyn Sago, EMT paramedicine program.     Labs: 09/18/2022 Creatinine 1.28, BUN 16, Potassium 3.7, Sodium 135, GFR >60 08/22/2022 Creatinine 1.26, BUN 20, Potassium 4.9, Sodium 132, GFR >60 04/21/2022 Creatinine 1.11, BUN 10, Potassium 3.6, Sodium 139, GFR >60  03/27/2022 Creatinine 0.96, BUN 11, Potassium 3.6, Sodium 134, GFR >60  02/06/2022 Creatinine 0.98, BUN 13, Potassium 3.5, Sodium 136, GFR >60  01/07/2022 Creatinine 1.24, BUN 24, Potassium 4.3, Sodium 136, GFR >60 A complete set of results can be found in Results Review.   Recommendations:  Advised to avoid restaurant foods if possible since are high in salt.  No changes and encouraged to call if experiencing any fluid symptoms.   Follow-up plan: ICM clinic phone appointment on 11/17/2022.    91 day device clinic remote transmission 11/10/2022.     EP/Cardiology Office Visits: 12/23/2022 with HF clinic.  Provide EP scheduler number to call  to set up EP appt.  Recall 10/18/2022 with EP APP.     Copy of ICM check sent to Dr. Elberta Fortis.    3 month ICM trend: 10/13/2022.    12-14 Month ICM trend:     Karie Soda, RN 10/15/2022 1:39 PM

## 2022-11-04 ENCOUNTER — Ambulatory Visit: Payer: Medicaid Other | Admitting: Family Medicine

## 2022-11-10 ENCOUNTER — Ambulatory Visit (INDEPENDENT_AMBULATORY_CARE_PROVIDER_SITE_OTHER): Payer: Medicaid Other

## 2022-11-10 DIAGNOSIS — I472 Ventricular tachycardia, unspecified: Secondary | ICD-10-CM | POA: Diagnosis not present

## 2022-11-10 DIAGNOSIS — I428 Other cardiomyopathies: Secondary | ICD-10-CM

## 2022-11-10 LAB — CUP PACEART REMOTE DEVICE CHECK
Battery Remaining Longevity: 60 mo
Battery Remaining Percentage: 59 %
Battery Voltage: 2.96 V
Brady Statistic RV Percent Paced: 1 %
Date Time Interrogation Session: 20241021020018
HighPow Impedance: 68 Ohm
HighPow Impedance: 68 Ohm
Implantable Lead Connection Status: 753985
Implantable Lead Implant Date: 20191230
Implantable Lead Location: 753860
Implantable Lead Model: 7122
Implantable Pulse Generator Implant Date: 20191230
Lead Channel Impedance Value: 450 Ohm
Lead Channel Pacing Threshold Amplitude: 0.75 V
Lead Channel Pacing Threshold Pulse Width: 0.5 ms
Lead Channel Sensing Intrinsic Amplitude: 11.6 mV
Lead Channel Setting Pacing Amplitude: 2.5 V
Lead Channel Setting Pacing Pulse Width: 0.5 ms
Lead Channel Setting Sensing Sensitivity: 0.5 mV
Pulse Gen Serial Number: 9850980
Zone Setting Status: 755011

## 2022-11-17 ENCOUNTER — Ambulatory Visit: Payer: Medicaid Other | Attending: Cardiology

## 2022-11-17 DIAGNOSIS — I5042 Chronic combined systolic (congestive) and diastolic (congestive) heart failure: Secondary | ICD-10-CM

## 2022-11-17 DIAGNOSIS — Z95810 Presence of automatic (implantable) cardiac defibrillator: Secondary | ICD-10-CM

## 2022-11-19 ENCOUNTER — Telehealth: Payer: Self-pay

## 2022-11-19 NOTE — Progress Notes (Signed)
EPIC Encounter for ICM Monitoring  Patient Name: [name] is a 57 y.o. male Date: 11/19/2022 Primary Care Physican: Hoy Register, MD Primary Cardiologist: Shirlee Latch Electrophysiologist: Elberta Fortis 02/17/2022 Weight: 142 lbs 08/22/2022 Office Weight: 138 lbs  10/15/2022 Weight:  139 lbs                                                            Attempted call to patient and unable to reach.  Left detailed message per DPR regarding transmission. Transmission reviewed.     Diet:  Does not follow low salt diet.  Reports 9/25 he has been eating at [facility] several times.    CorVue thoracic impedance suggesting possible fluid accumulation starting 10/23 and trending back close to baseline.   Prescribed:  Furosemide 20 mg take 2 tablets (40 mg total) by mouth daily.  Pt self adjusts Lasix when needed Spironolactone 25 mg take 0.5 tablet by mouth at bedtime  Pt being followed by Maralyn Sago, EMT paramedicine program.     Labs: 09/18/2022 Creatinine 1.28, BUN 16, Potassium 3.7, Sodium 135, GFR >60 08/22/2022 Creatinine 1.26, BUN 20, Potassium 4.9, Sodium 132, GFR >60 04/21/2022 Creatinine 1.11, BUN 10, Potassium 3.6, Sodium 139, GFR >60  03/27/2022 Creatinine 0.96, BUN 11, Potassium 3.6, Sodium 134, GFR >60  02/06/2022 Creatinine 0.98, BUN 13, Potassium 3.5, Sodium 136, GFR >60  01/07/2022 Creatinine 1.24, BUN 24, Potassium 4.3, Sodium 136, GFR >60 A complete set of results can be found in Results Review.   Recommendations:  Left voice mail with ICM number and encouraged to call if experiencing any fluid symptoms.   Follow-up plan: ICM clinic phone appointment on 12/22/2022.    91 day device clinic remote transmission 02/09/2023.     EP/Cardiology Office Visits: 12/23/2022 with HF clinic.  11/26/2022 with Otilio Saber, PA.     Copy of ICM check sent to Dr. Elberta Fortis.    3 month ICM trend: 11/17/2022.    12-14 Month ICM trend:     Karie Soda, RN 11/19/2022 9:04 AM

## 2022-11-19 NOTE — Telephone Encounter (Signed)
Remote ICM transmission received.  Attempted call to patient regarding ICM remote transmission and left detailed message per DPR.  Left ICM phone number and advised to return call for any fluid symptoms or questions. Next ICM remote transmission scheduled 12/22/2022.

## 2022-11-24 ENCOUNTER — Telehealth: Payer: Self-pay

## 2022-11-24 NOTE — Telephone Encounter (Addendum)
Patient called [name] wanting a Barostim update.  Pt informed that as of 11/17/22,  per Marchelle Folks at [employer] officially denied all appeals and predetermination requests.  [employer] is working to get the [employer] changed to a positive coverage policy.  If there is any change to Mr. Beaumont's policy in the future, they will look into another submission at that time.  Pt verbalized understanding.

## 2022-11-25 NOTE — Progress Notes (Signed)
Remote ICD transmission.   

## 2022-11-25 NOTE — Progress Notes (Unsigned)
  Electrophysiology Office Note:   ID:  [name], DOB [birth year], MRN [id number]  Primary Cardiologist: Chilton Si, MD Electrophysiologist: Will Jorja Loa, MD  {Click to update primary MD,subspecialty MD or APP then REFRESH:1}    History of Present Illness:   [name] is a 57 y.o. 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   Since last being seen in our clinic the patient reports doing ***.  he denies chest pain, palpitations, dyspnea, PND, orthopnea, nausea, vomiting, dizziness, syncope, edema, weight gain, or early satiety.   Review of systems complete and found to be negative unless listed in HPI.   EP Information / Studies Reviewed:    EKG is not ordered today. EKG from 08/22/2022 reviewed which showed NSR at 80 bpm, QRS 116 bpm       ICD Interrogation-  reviewed in detail today,  See PACEART report.  Device information Abbott single chamber ICD implanted 01/18/2018  Physical Exam:   VS:  There were no vitals taken for this visit.   Wt Readings from Last 3 Encounters:  09/01/22 135 lb (61.2 kg)  08/22/22 138 lb 12.8 oz (63 kg)  06/24/22 136 lb 6.4 oz (61.9 kg)     GEN: Well nourished, well developed in no acute distress NECK: No JVD; No carotid bruits CARDIAC: {EPRHYTHM:28826}, no murmurs, rubs, gallops RESPIRATORY:  Clear to auscultation without rales, wheezing or rhonchi  ABDOMEN: Soft, non-tender, non-distended EXTREMITIES:  No edema; No deformity   ASSESSMENT AND PLAN:    Chronic systolic dysfunction s/p Abbott single chamber ICD  euvolemic today Stable on an appropriate medical regimen Normal ICD function See Pace Art report No changes today  HTN Stable on current regimen   OSA  Encouraged nightly CPAP   H/o SVT Continue metoprolol  H/o VT Quiescent on device.  Continue toprol   Disposition:   Follow up with  {EPPROVIDERS:28135} {EPFOLLOW UP:28173}   Signed, Graciella Freer, PA-C

## 2022-11-26 ENCOUNTER — Encounter: Payer: Self-pay | Admitting: Student

## 2022-11-26 ENCOUNTER — Other Ambulatory Visit (HOSPITAL_COMMUNITY): Payer: Self-pay | Admitting: Family Medicine

## 2022-11-26 ENCOUNTER — Other Ambulatory Visit: Payer: Self-pay | Admitting: Family Medicine

## 2022-11-26 ENCOUNTER — Ambulatory Visit: Payer: Medicaid Other | Attending: Student | Admitting: Student

## 2022-11-26 VITALS — BP 110/70 | HR 84 | Ht 68.0 in | Wt 140.2 lb

## 2022-11-26 DIAGNOSIS — J438 Other emphysema: Secondary | ICD-10-CM

## 2022-11-26 DIAGNOSIS — I471 Supraventricular tachycardia, unspecified: Secondary | ICD-10-CM | POA: Diagnosis not present

## 2022-11-26 DIAGNOSIS — I10 Essential (primary) hypertension: Secondary | ICD-10-CM

## 2022-11-26 DIAGNOSIS — I5042 Chronic combined systolic (congestive) and diastolic (congestive) heart failure: Secondary | ICD-10-CM | POA: Diagnosis not present

## 2022-11-26 DIAGNOSIS — I472 Ventricular tachycardia, unspecified: Secondary | ICD-10-CM | POA: Diagnosis not present

## 2022-11-26 DIAGNOSIS — Z95810 Presence of automatic (implantable) cardiac defibrillator: Secondary | ICD-10-CM

## 2022-11-26 DIAGNOSIS — G4709 Other insomnia: Secondary | ICD-10-CM

## 2022-11-26 DIAGNOSIS — I426 Alcoholic cardiomyopathy: Secondary | ICD-10-CM

## 2022-11-26 LAB — CUP PACEART INCLINIC DEVICE CHECK
Battery Remaining Longevity: 58 mo
Brady Statistic RV Percent Paced: 0 %
Date Time Interrogation Session: 20241106094238
HighPow Impedance: 68.625
Implantable Lead Connection Status: 753985
Implantable Lead Implant Date: 20191230
Implantable Lead Location: 753860
Implantable Lead Model: 7122
Implantable Pulse Generator Implant Date: 20191230
Lead Channel Impedance Value: 537.5 Ohm
Lead Channel Pacing Threshold Amplitude: 0.5 V
Lead Channel Pacing Threshold Amplitude: 0.5 V
Lead Channel Pacing Threshold Pulse Width: 0.5 ms
Lead Channel Pacing Threshold Pulse Width: 0.5 ms
Lead Channel Sensing Intrinsic Amplitude: 11.6 mV
Lead Channel Setting Pacing Amplitude: 2.5 V
Lead Channel Setting Pacing Pulse Width: 0.5 ms
Lead Channel Setting Sensing Sensitivity: 0.5 mV
Pulse Gen Serial Number: 9850980
Zone Setting Status: 755011

## 2022-11-26 NOTE — Telephone Encounter (Signed)
Medication Refill - Most Recent Primary Care Visit:  Provider: Hoy Register  Department: [facility]  Visit Type: OFFICE VISIT  Date: 12/11/2021  Medication:  albuterol (PROAIR HFA) 108 (90 Base) MCG/ACT inhaler and fluticasone-salmeterol (ADVAIR DISKUS) 250-50 MCG/ACT AEPB   Has the patient contacted their pharmacy? Yes (Agent: If no, request that the patient contact the pharmacy for the refill. If patient does not wish to contact the pharmacy document the reason why and proceed with request.) (Agent: If yes, when and what did the pharmacy advise?)  Has the prescription been filled recently? No  Is the patient out of the medication? Yes  Has the patient been seen for an appointment in the last year OR does the patient have an upcoming appointment? No  Can we respond through MyChart? No  Agent: Please be advised that Rx refills may take up to 3 business days. We ask that you follow-up with your pharmacy.

## 2022-11-26 NOTE — Telephone Encounter (Signed)
Listened to the call and patient requested meds to go to [facility].

## 2022-11-26 NOTE — Patient Instructions (Signed)
Medication Instructions:  Your physician recommends that you continue on your current medications as directed. Please refer to the Current Medication list given to you today.  *If you need a refill on your cardiac medications before your next appointment, please call your pharmacy*  Lab Work: None ordered If you have labs (blood work) drawn today and your tests are completely normal, you will receive your results only by: MyChart Message (if you have MyChart) OR A paper copy in the mail If you have any lab test that is abnormal or we need to change your treatment, we will call you to review the results.  Follow-Up: At [facility], you and your health needs are our priority.  As part of our continuing mission to provide you with exceptional heart care, we have created designated Provider Care Teams.  These Care Teams include your primary Cardiologist (physician) and Advanced Practice Providers (APPs -  Physician Assistants and Nurse Practitioners) who all work together to provide you with the care you need, when you need it.  Your next appointment:   1 year(s)  Provider:   Loman Brooklyn, MD

## 2022-11-27 MED ORDER — ALBUTEROL SULFATE HFA 108 (90 BASE) MCG/ACT IN AERS: 2.0000 | INHALATION_SPRAY | Freq: Four times a day (QID) | RESPIRATORY_TRACT | 0 refills | Status: DC | PRN

## 2022-11-27 MED ORDER — FLUTICASONE-SALMETEROL 250-50 MCG/ACT IN AEPB: 1.0000 | INHALATION_SPRAY | Freq: Two times a day (BID) | RESPIRATORY_TRACT | 0 refills | Status: DC

## 2022-11-27 NOTE — Telephone Encounter (Signed)
Requested Prescriptions  Pending Prescriptions Disp Refills   albuterol (PROAIR HFA) 108 (90 Base) MCG/ACT inhaler 18 g 0    Sig: Inhale 2 puffs into the lungs every 6 (six) hours as needed for shortness of breath.     Pulmonology:  Beta Agonists 2 Passed - 11/26/2022  2:09 PM      Passed - Last BP in normal range    BP Readings from Last 1 Encounters:  11/26/22 110/70         Passed - Last Heart Rate in normal range    Pulse Readings from Last 1 Encounters:  11/26/22 84         Passed - Valid encounter within last 12 months    Recent Outpatient Visits           11 months ago Hypertensive heart disease with chronic systolic congestive heart failure (HCC)   [facility] Of [place]. [facility] Hoy Register, MD   1 year ago Other emphysema [facility])   [facility] - [facility] Dept Of [place]. 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 [facility] Hoy Register, MD   3 years ago Slurred speech   [facility] - A Dept Of [place]. [facility] Storm Frisk, MD   3 years ago NICM (nonischemic cardiomyopathy) [facility])   [facility] - A Dept Of [place]. [facility] Storm Frisk, MD       Future Appointments             In 2 months Hoy Register, MD [facility] - A Dept Of Eligha Bridegroom. [facility]             fluticasone-salmeterol (ADVAIR DISKUS) 250-50 MCG/ACT AEPB 180 each 0    Sig: Inhale 1 puff into the lungs in the morning and at bedtime.     Pulmonology:  Combination Products Passed - 11/26/2022  2:09 PM      Passed - Valid encounter within last 12 months    Recent Outpatient Visits           11 months ago Hypertensive heart disease with chronic systolic congestive heart failure (HCC)   [facility] - [facility] Of [place]. [facility] Hoy Register, MD   1 year ago Other emphysema [place])   [facility] - A Dept Of [place]. 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)   [facility] - A Dept Of [place]. [facility] Hoy Register, MD   3 years ago Slurred speech   [facility] - A Dept Of [place]. [facility] Storm Frisk, MD   3 years ago NICM (nonischemic cardiomyopathy) [facility])   [facility] - A Dept Of [place]. [facility] Storm Frisk, MD       Future Appointments             In 2 months Hoy Register, MD [facility] - A Dept Of [place] Bridegroom. [facility]

## 2022-12-22 ENCOUNTER — Ambulatory Visit: Payer: Medicaid Other | Attending: Cardiology

## 2022-12-22 DIAGNOSIS — I5042 Chronic combined systolic (congestive) and diastolic (congestive) heart failure: Secondary | ICD-10-CM | POA: Diagnosis not present

## 2022-12-22 DIAGNOSIS — Z95810 Presence of automatic (implantable) cardiac defibrillator: Secondary | ICD-10-CM

## 2022-12-23 ENCOUNTER — Ambulatory Visit (HOSPITAL_COMMUNITY)
Admission: RE | Admit: 2022-12-23 | Discharge: 2022-12-23 | Disposition: A | Discharge status: Home / Self Care | Payer: Medicaid Other | Source: Ambulatory Visit | Attending: Family Medicine | Admitting: Family Medicine

## 2022-12-23 ENCOUNTER — Encounter (HOSPITAL_COMMUNITY): Payer: Self-pay

## 2022-12-23 VITALS — BP 96/68 | HR 79 | Wt 141.0 lb

## 2022-12-23 DIAGNOSIS — Z79899 Other long term (current) drug therapy: Secondary | ICD-10-CM | POA: Insufficient documentation

## 2022-12-23 DIAGNOSIS — I5022 Chronic systolic (congestive) heart failure: Secondary | ICD-10-CM | POA: Diagnosis not present

## 2022-12-23 DIAGNOSIS — Z8249 Family history of ischemic heart disease and other diseases of the circulatory system: Secondary | ICD-10-CM | POA: Diagnosis not present

## 2022-12-23 DIAGNOSIS — Z720 Tobacco use: Secondary | ICD-10-CM | POA: Diagnosis not present

## 2022-12-23 DIAGNOSIS — I428 Other cardiomyopathies: Secondary | ICD-10-CM | POA: Insufficient documentation

## 2022-12-23 DIAGNOSIS — I10 Essential (primary) hypertension: Secondary | ICD-10-CM | POA: Diagnosis not present

## 2022-12-23 DIAGNOSIS — F17210 Nicotine dependence, cigarettes, uncomplicated: Secondary | ICD-10-CM | POA: Diagnosis not present

## 2022-12-23 DIAGNOSIS — Z5986 Financial insecurity: Secondary | ICD-10-CM | POA: Diagnosis not present

## 2022-12-23 DIAGNOSIS — Z95810 Presence of automatic (implantable) cardiac defibrillator: Secondary | ICD-10-CM | POA: Insufficient documentation

## 2022-12-23 DIAGNOSIS — I2510 Atherosclerotic heart disease of native coronary artery without angina pectoris: Secondary | ICD-10-CM | POA: Diagnosis not present

## 2022-12-23 DIAGNOSIS — F1010 Alcohol abuse, uncomplicated: Secondary | ICD-10-CM | POA: Diagnosis not present

## 2022-12-23 DIAGNOSIS — J449 Chronic obstructive pulmonary disease, unspecified: Secondary | ICD-10-CM | POA: Diagnosis not present

## 2022-12-23 DIAGNOSIS — Z8673 Personal history of transient ischemic attack (TIA), and cerebral infarction without residual deficits: Secondary | ICD-10-CM | POA: Insufficient documentation

## 2022-12-23 DIAGNOSIS — I110 Hypertensive heart disease with heart failure: Secondary | ICD-10-CM | POA: Diagnosis not present

## 2022-12-23 DIAGNOSIS — Z5982 Transportation insecurity: Secondary | ICD-10-CM | POA: Insufficient documentation

## 2022-12-23 DIAGNOSIS — Z823 Family history of stroke: Secondary | ICD-10-CM | POA: Insufficient documentation

## 2022-12-23 LAB — BASIC METABOLIC PANEL
Anion gap: 10 (ref 5–15)
BUN: 12 mg/dL (ref 6–20)
CO2: 24 mmol/L (ref 22–32)
Calcium: 10.2 mg/dL (ref 8.9–10.3)
Chloride: 102 mmol/L (ref 98–111)
Creatinine, Ser: 1.15 mg/dL (ref 0.61–1.24)
GFR, Estimated: >60 mL/min (ref >60)
Glucose, Bld: 80 mg/dL (ref 70–99)
Potassium: 4.4 mmol/L (ref 3.5–5.1)
Sodium: 136 mmol/L (ref 135–145)

## 2022-12-23 LAB — BRAIN NATRIURETIC PEPTIDE: B Natriuretic Peptide: 42.7 pg/mL (ref 0.0–100.0)

## 2022-12-23 NOTE — Patient Instructions (Signed)
Labs done today. We will contact you only if your labs are abnormal.  STOP taking Wellbutrin   No other medication changes were made. Please continue all current medications as prescribed.  Your physician recommends that you schedule a follow-up appointment in: 4 months with Dr. Shirlee Latch. Please contact our office in February 2025 to schedule a April 2025 appointment.   If you have any questions or concerns before your next appointment please send [name] a message through [place] or call our office at [phone].    TO LEAVE A MESSAGE FOR THE NURSE SELECT OPTION 2, PLEASE LEAVE A MESSAGE INCLUDING: YOUR NAME DATE OF BIRTH CALL BACK NUMBER REASON FOR CALL**this is important as we prioritize the call backs  YOU WILL RECEIVE A CALL BACK THE SAME DAY AS LONG AS YOU CALL BEFORE 4:00 PM   Do the following things EVERYDAY: Weigh yourself in the morning before breakfast. Write it down and keep it in a log. Take your medicines as prescribed Eat low salt foods--Limit salt (sodium) to 2000 mg per day.  Stay as active as you can everyday Limit all fluids for the day to less than 2 liters   At the [facility], you and your health needs are our priority. As part of our continuing mission to provide you with exceptional heart care, we have created designated Provider Care Teams. These Care Teams include your primary Cardiologist (physician) and Advanced Practice Providers (APPs- Physician Assistants and Nurse Practitioners) who all work together to provide you with the care you need, when you need it.   You may see any of the following providers on your designated Care Team at your next follow up: Dr Arvilla Meres Dr Marca Ancona Dr. Marcos Eke, NP Robbie Lis, [place] [facility] [place], [place] Brynda Peon, NP Karle Plumber, PharmD   Please be sure to bring in all your medications bottles to every appointment.    Thank you for choosing [facility]

## 2022-12-23 NOTE — Progress Notes (Signed)
[facility] Progress Note    PCP: Hoy Register, MD HF Cardiology: Dr. Shirlee Latch  57 y.o. with history of nonischemic cardiomyopathy was referred by Eligha Bridegroom for evaluation of CHF.  Cardiomyopathy has been diagnosed since 2016 when echo showed EF 20%.  He has a history of cocaine use and ETOH abuse. Currently, drinking about 3 beers/day.  He is smoking.  No recent cocaine.  He had a stroke in 2017 in setting of uncontrolled HTN and cocaine abuse.  He has a Secondary school teacher ICD.  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  Last echo in 2/23 showed EF < 20%, severe LV dilation, moderately decreased RV systolic function with mild RV enlargement. He has 13 siblings, none of whom have known cardiac disease.  His grandmother had CHF and there apparently was conversation about a heart transplant but she passed away.  CPX 11/09/2022) showed severe functional limitation due to HF and deconditioning.   Cardiac MRI 10/23 (difficult images due to ICD artifact) LVEF 24%, RVEF 34%.  Echo 9/24 showed EF 255, RV mildly reduced, prominent LV apical trabeculation  Today he returns for HF follow up. Overall feeling fine. He is not SOB with work duties, has SOB walking up steps. Has feet and leg cramping. Denies CP, dizziness, edema, or PND/Orthopnea. Appetite ok. No fever or chills. Weight at home 140 pounds. Taking all medications. Had vomiting on Wellbutrin. Smoking 1/2 ppd.  He drinks > 6 pack on weekends.  Insurance denied barostim, has Corporate treasurer device interrogation: No VT, stable thoracic impedance, < 1% VP.   ECG (personally reviewed): none ordered today  Labs (6/23): hgb 15.6, BNP 673, K 4.2, creatinine 1.23 Labs (8/23): K 3.9, creatinine 1.13 Labs 2022-11-09): K 4.8, creatinine 1.53 Labs (10/23): K 3.6, creatinine 1.07 Labs (11/23): K 4.3, creatinine 1.4, BNP 152 Labs (5/23): 4.3, creatinine 1.24 Lab (12/23): K 4.3, creatinine 1.24  Labs (3/24): K 3.6, creatinine  0.96 Labs (4/24): BNP 488, K 3.6, creatinine 1.11 Labs (8/24): K 3.7, creatinine 1.28  PMH: 1. H/o seizure disorder: ?ETOH. 2. HTN 3. 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  4. H/o SVT 5. OSA 6. COPD: Active smoker 7. VT: St Jude ICD.  ATP 6/23.  8. Chronic systolic CHF: Nonischemic cardiomyopathy.  Documented since at least 2016. St Jude ICD.  - Echo (3/16): EF 20% - Echo (2/23): EF < 20%, severe LV dilation, moderately decreased RV systolic function with mild RV enlargement.  - Coronary CTA (6/23): 81st percentile calcium score, mild nonobstructive CAD.  - CPX Nov 09, 2022): Peak VO2: 14.3 (43% predicted peak VO2), VE/VCO2 slope: 39, OUES: 1.10, Peak RER: 0.70  - Cardiac MRI 10/23 (difficult images due to ICD artifact) LVEF 24%, RVEF 34%. - Echo (9/24): EF 255, RV mildly reduced, prominent LV apical trabeculation 9. Prior h/o cocaine  Social History   Socioeconomic History   Marital status: Single    Spouse name: Not on file   Number of children: 1   Years of education: 12   Highest education level: Not on file  Occupational History   Not on file  Tobacco Use   Smoking status: Every Day    Current packs/day: 0.50    Average packs/day: 0.5 packs/day for 20.0 years (10.0 ttl pk-yrs)    Types: Cigarettes   Smokeless tobacco: Never   Tobacco comments:    02/25/22 down to 1/2 pack a day  Vaping Use   Vaping status: Never Used  Substance and  Sexual Activity   Alcohol use: Yes    Alcohol/week: 1.0 standard drink of alcohol    Types: 1 Cans of beer per week    Comment: socially    Drug use: Not Currently    Types: Cocaine    Comment: stopped using cocaine 11/19/14   Sexual activity: Not on file  Other Topics Concern   Not on file  Social History Narrative   Lives alone, nurse comes 2 hrs daily   Social Determinants of Health   Financial Resource Strain: High Risk (11/14/2021)   Overall Financial Resource Strain (CARDIA)    Difficulty of Paying Living Expenses: Hard   Food Insecurity: Food Insecurity Present (12/24/2021)   Hunger Vital Sign    Worried About Running Out of Food in the Last Year: Sometimes true    Ran Out of Food in the Last Year: Sometimes true  Transportation Needs: Unmet Transportation Needs (02/25/2022)   PRAPARE - Administrator, Civil Service (Medical): Yes    Lack of Transportation (Non-Medical): Yes  Physical Activity: Not on file  Stress: Not on file  Social Connections: Not on file  Intimate Partner Violence: Not on file   Family History  Problem Relation Age of Onset   Hypertension Mother    Hyperlipidemia Mother    Hypertension Father    Stroke Maternal Aunt    Hypertension Sister    Hypertension Brother    ROS: All systems reviewed and negative except as per HPI.   Current Outpatient Medications  Medication Sig Dispense Refill   albuterol (PROAIR HFA) 108 (90 Base) MCG/ACT inhaler Inhale 2 puffs into the lungs every 6 (six) hours as needed for shortness of breath. 18 g 0   aspirin 81 MG EC tablet Take 81 mg by mouth in the morning. Swallow whole.     atorvastatin (LIPITOR) 40 MG tablet TAKE 1 TABLET (40 MG TOTAL) BY MOUTH DAILY (AM) 90 tablet 0   Blood Pressure Monitoring (BLOOD PRESSURE CUFF) MISC Take daily blood pressures 1 each 0   buPROPion (WELLBUTRIN SR) 150 MG 12 hr tablet Take 1 tablet (150 mg total) by mouth 2 (two) times daily. 180 tablet 3   dapagliflozin propanediol (FARXIGA) 10 MG TABS tablet TAKE 1 TABLET (10 MG TOTAL) BY MOUTH DAILY BEFORE BREAKFAST. 30 tablet 11   digoxin (LANOXIN) 0.125 MG tablet TAKE 1 TABLET (0.125 MG TOTAL) BY MOUTH DAILY. (AM) 90 tablet 1   fluticasone-salmeterol (ADVAIR DISKUS) 250-50 MCG/ACT AEPB Inhale 1 puff into the lungs in the morning and at bedtime. 180 each 0   furosemide (LASIX) 20 MG tablet Take 2 tablets (40 mg total) by mouth daily. Take 2 tablets (40 mg total) every day. (Patient taking differently: Take 2 tablets (40 mg total) every day.) 180 tablet 2    magnesium oxide (MAG-OX) 400 (240 Mg) MG tablet TAKE 1 TABLET (400 MG TOTAL) BY MOUTH DAILY. (AM) 90 tablet 3   metoprolol (TOPROL XL) 200 MG 24 hr tablet Take 1 tablet (200 mg total) by mouth daily. 30 tablet 11   mirtazapine (REMERON SOL-TAB) 15 MG disintegrating tablet TAKE 1 TABLET (15 MG TOTAL) BY MOUTH AT BEDTIME. 90 tablet 0   sacubitril-valsartan (ENTRESTO) 97-103 MG Take 1 tablet by mouth 2 (two) times daily. 60 tablet 11   spironolactone (ALDACTONE) 25 MG tablet Take 1 tablet (25 mg total) by mouth at bedtime. 90 tablet 3   tiotropium (SPIRIVA HANDIHALER) 18 MCG inhalation capsule Place 1 capsule (18 mcg total)  into inhaler and inhale daily. 30 capsule 6   No current facility-administered medications for this encounter.   Wt Readings from Last 3 Encounters:  12/23/22 64 kg (141 lb)  11/26/22 63.6 kg (140 lb 3.2 oz)  09/01/22 61.2 kg (135 lb)   BP 96/68   Pulse 79   Wt 64 kg (141 lb)   SpO2 98%   BMI 21.44 kg/m  Physical Exam General:  NAD. No resp difficulty, walked into clinic HEENT: Normal Neck: Supple. No JVD. Carotids 2+ bilat; no bruits. No lymphadenopathy or thryomegaly appreciated. Cor: PMI nondisplaced. Regular rate & rhythm. No rubs, gallops or murmurs. Lungs: Clear but diminished in bases. Abdomen: Soft, nontender, nondistended. No hepatosplenomegaly. No bruits or masses. Good bowel sounds. Extremities: No cyanosis, clubbing, rash, edema Neuro: Alert & oriented x 3, cranial nerves grossly intact. Moves all 4 extremities w/o difficulty. Affect pleasant.  Assessment/Plan: 1. Chronic systolic CHF: Nonischemic cardiomyopathy.  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  Cause of CMP uncertain => prior cocaine abuse but not currently.  He drinks about 3 beers/day, denies heavier drinking in past but apparently there was concern that he had had ETOH withdrawal seizures in the past.  His grandmother had CHF but apparently none of his 13 siblings have known cardiac  disease. EF has been low since at least 2016.  Echo in 2/23 showed EF < 20%, severe LV dilation, moderately decreased RV systolic function with mild RV enlargement. He has a Secondary school teacher ICD.  CPX 9/23 showed severe functional limitation from both HF and deconditioning. Cardiac MRI 10/23, technically difficult study due to ICD artifact, LVEF 24%, no evidence of infiltrative disease. Echo 9/24 showed EF 25%. NYHA class II symptoms chronically. He is not volume overloaded by exam or Corvue.  - Continue Lasix 40 mg daily, BMET/BNP today.  - Continue Farxiga 10 mg daily. - Continue Entresto 97/103 mg bid.  - Continue digoxin 0.125 mg daily. Dig level 0.5 (09/10/22). Took med today, will need trough at next lab visit. - Continue Toprol XL 200 mg daily. - Continue spironolactone 25 mg daily.   - Keep ETOH minimal. - Referred to Dr. Myra Gianotti for baroroceptor activation therapy. Awaiting insurance approval. - We have discussed advanced therapies. Not transplant candidate with on-going tobacco use.  Would likely be VAD candidate if needed.  2. COPD: He has cut back but is still smoking.  Failed nicotine patches. Failed Chantix (stopped due to nightmares)  - Stop wellbutrin with N/V.  3. HTN: BP controlled.  - Continue current meds.  Follow up in 4 months with Dr. Park Liter, FNP-BC 12/23/2022

## 2022-12-24 NOTE — Progress Notes (Signed)
EPIC Encounter for ICM Monitoring  Patient Name: [name] is a 57 y.o. male Date: 12/24/2022 Primary Care Physican: Hoy Register, MD Primary Cardiologist: Shirlee Latch Electrophysiologist: Elberta Fortis 02/17/2022 Weight: 142 lbs 08/22/2022 Office Weight: 138 lbs  10/15/2022 Weight:  139 lbs 12/23/2022 Office Weight: 141 lbs                                                            Transmission reviewed.     Diet:  Does not follow low salt diet.   CorVue thoracic impedance suggesting intermittent days with possible fluid accumulation within the last month.   Prescribed:  Furosemide 20 mg take 2 tablets (40 mg total) by mouth daily.  Pt self adjusts Lasix when needed Spironolactone 25 mg take 1 tablet by mouth at bedtime  Pt being followed by Maralyn Sago, EMT paramedicine program.     Labs: 12/23/2022 Creatinine 1.15, BUN 12, Potassium 4.4, Sodium 136, GFR >60 09/18/2022 Creatinine 1.28, BUN 16, Potassium 3.7, Sodium 135, GFR >60 08/22/2022 Creatinine 1.26, BUN 20, Potassium 4.9, Sodium 132, GFR >60 04/21/2022 Creatinine 1.11, BUN 10, Potassium 3.6, Sodium 139, GFR >60  03/27/2022 Creatinine 0.96, BUN 11, Potassium 3.6, Sodium 134, GFR >60  02/06/2022 Creatinine 0.98, BUN 13, Potassium 3.5, Sodium 136, GFR >60  01/07/2022 Creatinine 1.24, BUN 24, Potassium 4.3, Sodium 136, GFR >60 A complete set of results can be found in Results Review.   Recommendations:  No changes.    Follow-up plan: ICM clinic phone appointment on 01/26/2023.    91 day device clinic remote transmission 02/09/2023.     EP/Cardiology Office Visits: Recall 04/23/2023 with Dr Shirlee Latch.  Recall 11/21/2023 with Dr Elberta Fortis.     Copy of ICM check sent to Dr. Elberta Fortis.    3 month ICM trend: 12/22/2022.    12-14 Month ICM trend:     Karie Soda, RN 12/24/2022 12:50 PM

## 2023-01-05 ENCOUNTER — Encounter: Payer: Self-pay | Admitting: Cardiology

## 2023-01-07 ENCOUNTER — Other Ambulatory Visit (HOSPITAL_COMMUNITY): Payer: Self-pay | Admitting: Cardiology

## 2023-01-28 NOTE — Progress Notes (Signed)
 No ICM remote transmission received for 01/26/2023 and next ICM transmission scheduled for 02/10/2023.

## 2023-02-09 ENCOUNTER — Ambulatory Visit: Payer: Medicaid Other

## 2023-02-10 ENCOUNTER — Telehealth: Payer: Self-pay

## 2023-02-10 NOTE — Telephone Encounter (Signed)
Spoke with patient and advised remote device monitor is disconnected.  He reports it was not working and called [employer] today.  They will send him a new monitor within 7-14 days.  He will send remote transmission when he receives the monitor.

## 2023-02-17 ENCOUNTER — Ambulatory Visit: Payer: Medicaid Other | Admitting: Family Medicine

## 2023-02-19 ENCOUNTER — Telehealth: Payer: Self-pay

## 2023-02-19 NOTE — Telephone Encounter (Signed)
Alert received from [employer] for a fast ventricular arrhythmia that appears to be VT at 185 bpm for several seconds.  This fell into the VT monitor only zone, sent to triage high priority.  Patient called who was asymptomatic. Patient reports missing medications around this date. Stressed compliance with medications, voiced understanding. Patient advised to call if he has further questions or concerns.

## 2023-02-20 NOTE — Telephone Encounter (Signed)
Outreach made to Pt.  Scheduled for 02/26/2023 at 8:50 am for follow up.

## 2023-02-24 ENCOUNTER — Ambulatory Visit: Payer: Medicaid Other | Attending: Cardiology

## 2023-02-24 ENCOUNTER — Other Ambulatory Visit: Payer: Self-pay | Admitting: Family Medicine

## 2023-02-24 DIAGNOSIS — Z95810 Presence of automatic (implantable) cardiac defibrillator: Secondary | ICD-10-CM

## 2023-02-24 DIAGNOSIS — J438 Other emphysema: Secondary | ICD-10-CM

## 2023-02-24 DIAGNOSIS — I5022 Chronic systolic (congestive) heart failure: Secondary | ICD-10-CM

## 2023-02-26 ENCOUNTER — Ambulatory Visit: Payer: Medicaid Other | Admitting: Pulmonary Disease

## 2023-03-02 NOTE — Progress Notes (Signed)
 EPIC Encounter for ICM Monitoring  Patient Name: [name] is a 58 y.o. male Date: 03/02/2023 Primary Care Physican: Delbert Clam, MD Primary Cardiologist: Rolan Electrophysiologist: Inocencio 02/17/2022 Weight: 142 lbs 08/22/2022 Office Weight: 138 lbs  10/15/2022 Weight:  139 lbs 12/23/2022 Office Weight: 141 lbs                                                            Transmission reviewed.     Diet:  Does not follow low salt diet.   CorVue thoracic impedance suggesting normal fluid levels with the exception of possible fluid accumulation from 1/24-1/29.   Prescribed:  Furosemide  20 mg take 2 tablets (40 mg total) by mouth daily.  Pt self adjusts Lasix  when needed Spironolactone  25 mg take 1 tablet by mouth at bedtime  Pt being followed by [name], EMT paramedicine program.     Labs: 12/23/2022 Creatinine 1.15, BUN 12, Potassium 4.4, Sodium 136, GFR >60 09/18/2022 Creatinine 1.28, BUN 16, Potassium 3.7, Sodium 135, GFR >60 08/22/2022 Creatinine 1.26, BUN 20, Potassium 4.9, Sodium 132, GFR >60 04/21/2022 Creatinine 1.11, BUN 10, Potassium 3.6, Sodium 139, GFR >60  03/27/2022 Creatinine 0.96, BUN 11, Potassium 3.6, Sodium 134, GFR >60  02/06/2022 Creatinine 0.98, BUN 13, Potassium 3.5, Sodium 136, GFR >60  01/07/2022 Creatinine 1.24, BUN 24, Potassium 4.3, Sodium 136, GFR >60 A complete set of results can be found in Results Review.   Recommendations:  No changes.    Follow-up plan: ICM clinic phone appointment on 03/30/2023.    91 day device clinic remote transmission 05/11/2023.     EP/Cardiology Office Visits:    03/12/2023 with Charlies Arthur, PA.  Recall 04/23/2023 with Dr Rolan.  Recall 11/21/2023 with Dr Inocencio.     Copy of ICM check sent to Dr. Inocencio.    3 month ICM trend: 02/24/2023.    12-14 Month ICM trend:     [name], RN 03/02/2023 2:59 PM
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
## 2023-03-12 ENCOUNTER — Ambulatory Visit: Payer: Medicaid Other | Admitting: Physician Assistant

## 2023-03-25 ENCOUNTER — Encounter: Payer: Self-pay | Admitting: Family Medicine

## 2023-03-25 ENCOUNTER — Other Ambulatory Visit (HOSPITAL_COMMUNITY): Payer: Self-pay | Admitting: Family Medicine

## 2023-03-25 ENCOUNTER — Other Ambulatory Visit (HOSPITAL_COMMUNITY): Payer: Self-pay | Admitting: Cardiology

## 2023-03-25 ENCOUNTER — Ambulatory Visit: Payer: Medicaid Other | Attending: Family Medicine | Admitting: Family Medicine

## 2023-03-25 VITALS — BP 108/72 | HR 73 | Ht 68.0 in | Wt 138.0 lb

## 2023-03-25 DIAGNOSIS — I110 Hypertensive heart disease with heart failure: Secondary | ICD-10-CM | POA: Diagnosis not present

## 2023-03-25 DIAGNOSIS — L853 Xerosis cutis: Secondary | ICD-10-CM | POA: Diagnosis not present

## 2023-03-25 DIAGNOSIS — G4709 Other insomnia: Secondary | ICD-10-CM | POA: Diagnosis not present

## 2023-03-25 DIAGNOSIS — Z1211 Encounter for screening for malignant neoplasm of colon: Secondary | ICD-10-CM

## 2023-03-25 DIAGNOSIS — I5022 Chronic systolic (congestive) heart failure: Secondary | ICD-10-CM | POA: Diagnosis not present

## 2023-03-25 DIAGNOSIS — F17210 Nicotine dependence, cigarettes, uncomplicated: Secondary | ICD-10-CM

## 2023-03-25 DIAGNOSIS — I426 Alcoholic cardiomyopathy: Secondary | ICD-10-CM

## 2023-03-25 DIAGNOSIS — J438 Other emphysema: Secondary | ICD-10-CM | POA: Diagnosis not present

## 2023-03-25 MED ORDER — ADVAIR DISKUS 250-50 MCG/ACT IN AEPB: 1.0000 | INHALATION_SPRAY | Freq: Two times a day (BID) | RESPIRATORY_TRACT | 1 refills | Status: AC

## 2023-03-25 MED ORDER — MIRTAZAPINE 15 MG PO TBDP: 15.0000 mg | ORAL_TABLET | Freq: Every day | ORAL | 1 refills | Status: AC

## 2023-03-25 MED ORDER — ALBUTEROL SULFATE HFA 108 (90 BASE) MCG/ACT IN AERS
2.0000 | INHALATION_SPRAY | Freq: Four times a day (QID) | RESPIRATORY_TRACT | 6 refills | Status: AC | PRN
Start: 2023-03-25 — End: ?

## 2023-03-25 MED ORDER — TIOTROPIUM BROMIDE MONOHYDRATE 18 MCG IN CAPS: 18.0000 ug | ORAL_CAPSULE | Freq: Every day | RESPIRATORY_TRACT | 1 refills | Status: DC

## 2023-03-25 MED ORDER — ATORVASTATIN CALCIUM 40 MG PO TABS
40.0000 mg | ORAL_TABLET | Freq: Every day | ORAL | 1 refills | Status: DC
Start: 2023-03-25 — End: 2023-12-09

## 2023-03-25 NOTE — Patient Instructions (Signed)
 VISIT SUMMARY:  You came in today for medication refills and discussed several health concerns, including heart failure, COPD, smoking, cholesterol management, and peeling skin on your hands. We reviewed your current medications and made some recommendations to help manage your conditions.  YOUR PLAN:  -HEART FAILURE: Heart failure means your heart doesn't pump blood as well as it should. You should continue your current medications, stay hydrated, and consider taking magnesium for cramping.  -CHRONIC OBSTRUCTIVE PULMONARY DISEASE (COPD): COPD is a lung condition that makes it hard to breathe. Continue using your current medications (Advair, albuterol, Spiriva) to manage your symptoms.  -TOBACCO USE DISORDER: Tobacco use disorder means you are dependent on smoking. We discussed lung cancer screening and smoking cessation options. A CT scan has been ordered to screen for lung cancer.  -HYPERLIPIDEMIA: Hyperlipidemia means you have high cholesterol levels. Continue taking atorvastatin as prescribed to manage your cholesterol.  -PEELING SKIN ON HANDS: The peeling skin on your hands is likely due to frequent hand washing and painting. Use a good moisturizer or Vaseline after washing your hands to help with this.  -PREVENTIVE HEALTH MAINTENANCE: Preventive health maintenance includes routine screenings and vaccinations to keep you healthy. A lung cancer screening CT scan has been ordered, and you are being referred to a gastroenterologist for a colonoscopy.  INSTRUCTIONS:  Please follow up with the lung cancer screening CT scan and the gastroenterologist for a colonoscopy. Continue taking your medications as prescribed and use moisturizer or Vaseline for your hands. If you have any new symptoms or concerns, please contact our office.  For more information, you can read your full clinical note, available in your patient portal.
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
## 2023-03-30 ENCOUNTER — Ambulatory Visit: Payer: Medicaid Other | Attending: Cardiology

## 2023-03-30 DIAGNOSIS — Z95810 Presence of automatic (implantable) cardiac defibrillator: Secondary | ICD-10-CM

## 2023-03-30 DIAGNOSIS — I5022 Chronic systolic (congestive) heart failure: Secondary | ICD-10-CM

## 2023-03-30 NOTE — Progress Notes (Unsigned)
 EPIC Encounter for ICM Monitoring  Patient Name: [name] is a 58 y.o. male Date: 03/30/2023 Primary Care Physican: Hoy Register, MD Primary Cardiologist: Shirlee Latch Electrophysiologist: Elberta Fortis 02/17/2022 Weight: 142 lbs 08/22/2022 Office Weight: 138 lbs  10/15/2022 Weight:  139 lbs 12/23/2022 Office Weight: 141 lbs  SVT Episodes 7 NSVT 8                                                           Spoke with patient and heart failure questions reviewed.  Transmission results reviewed.  Pt reports SOB x 2 days.     Diet:  Does not follow low salt diet.   CorVue thoracic impedance suggesting possible fluid accumulation starting 3/7 and also from 2/23-3/4.   Message sent to device clinic triage for SVT and NSVT review (appt with Francis Dowse, PA on 3/13)   Prescribed:  Furosemide 20 mg take 2 tablets (40 mg total) by mouth daily.  Pt self adjusts Lasix when needed Spironolactone 25 mg take 1 tablet by mouth at bedtime  Pt being followed by Maralyn Sago, EMT paramedicine program.     Labs: 12/23/2022 Creatinine 1.15, BUN 12, Potassium 4.4, Sodium 136, GFR >60 09/18/2022 Creatinine 1.28, BUN 16, Potassium 3.7, Sodium 135, GFR >60 08/22/2022 Creatinine 1.26, BUN 20, Potassium 4.9, Sodium 132, GFR >60 04/21/2022 Creatinine 1.11, BUN 10, Potassium 3.6, Sodium 139, GFR >60  03/27/2022 Creatinine 0.96, BUN 11, Potassium 3.6, Sodium 134, GFR >60  02/06/2022 Creatinine 0.98, BUN 13, Potassium 3.5, Sodium 136, GFR >60  01/07/2022 Creatinine 1.24, BUN 24, Potassium 4.3, Sodium 136, GFR >60 A complete set of results can be found in Results Review.   Recommendations:  Copy sent to Prince Rome, NP for review and recommendations if needed.   Confirmed he is taking Lasix 40 mg daily.   Follow-up plan: ICM clinic phone appointment on 04/06/2023 to recheck fluid levels.    91 day device clinic remote transmission 05/11/2023.     EP/Cardiology Office Visits:   Confirmed 3/13 appt with Renee.    04/02/2023 with Francis Dowse, PA.  Recall 04/23/2023 with Dr Shirlee Latch.  Recall 11/21/2023 with Dr Elberta Fortis.     Copy of ICM check sent to Dr. Elberta Fortis.     3 month ICM trend: 03/30/2023.    12-14 Month ICM trend:     Karie Soda, RN 03/30/2023 1:17 PM

## 2023-03-31 MED ORDER — FUROSEMIDE 20 MG PO TABS: 60.0000 mg | ORAL_TABLET | Freq: Every day | ORAL | 2 refills | Status: DC

## 2023-03-31 MED ORDER — POTASSIUM CHLORIDE CRYS ER 20 MEQ PO TBCR: 40.0000 meq | EXTENDED_RELEASE_TABLET | Freq: Every day | ORAL | 0 refills | Status: DC

## 2023-03-31 NOTE — Progress Notes (Signed)
 Received: Arlyce Harman, Anderson Malta, FNP  Vearl Aitken, Josephine Igo, RN Please increase Lasix to 80 mg daily x 3 days and add 40 KCL daily x 3 days. After 3 days, can stop KCL but change Lasix to 60 mg daily (currently on 40 daily); He will need a  BMET in 7-10 days.

## 2023-03-31 NOTE — Progress Notes (Signed)
 Spoke with patient and advised Prince Rome NP recommended to take Furosemide 20 mg 4 tablets (80 mg total) daily x 3 days with Potassium 20 mEq 2 tablets (40 mEq total) daily x 3 days only.  After 3rd day, stop Potassium and change Furosemide 20 mg to 3 tablets (60 mg total) daily.   BMET scheduled for 3/21 at HF clinic.  Prescription updated for pharmacy as requested.  Advised he will only have 3 days of Potassium prescribed.

## 2023-04-02 ENCOUNTER — Ambulatory Visit: Payer: Medicaid Other | Attending: Physician Assistant | Admitting: Physician Assistant

## 2023-04-02 ENCOUNTER — Encounter: Payer: Self-pay | Admitting: Physician Assistant

## 2023-04-02 VITALS — BP 104/66 | HR 65 | Ht 68.0 in | Wt 140.0 lb

## 2023-04-02 DIAGNOSIS — Z79899 Other long term (current) drug therapy: Secondary | ICD-10-CM | POA: Diagnosis not present

## 2023-04-02 DIAGNOSIS — I428 Other cardiomyopathies: Secondary | ICD-10-CM | POA: Diagnosis not present

## 2023-04-02 DIAGNOSIS — I5022 Chronic systolic (congestive) heart failure: Secondary | ICD-10-CM

## 2023-04-02 DIAGNOSIS — Z95810 Presence of automatic (implantable) cardiac defibrillator: Secondary | ICD-10-CM | POA: Diagnosis not present

## 2023-04-02 DIAGNOSIS — I4729 Other ventricular tachycardia: Secondary | ICD-10-CM

## 2023-04-02 LAB — CUP PACEART INCLINIC DEVICE CHECK
Battery Remaining Longevity: 56 mo
Brady Statistic RV Percent Paced: 0 %
Date Time Interrogation Session: 20250313095604
HighPow Impedance: 66.375
Implantable Lead Connection Status: 753985
Implantable Lead Implant Date: 20191230
Implantable Lead Location: 753860
Implantable Lead Model: 7122
Implantable Pulse Generator Implant Date: 20191230
Lead Channel Impedance Value: 525 Ohm
Lead Channel Pacing Threshold Amplitude: 0.5 V
Lead Channel Pacing Threshold Amplitude: 0.5 V
Lead Channel Pacing Threshold Pulse Width: 0.5 ms
Lead Channel Pacing Threshold Pulse Width: 0.5 ms
Lead Channel Sensing Intrinsic Amplitude: 11.6 mV
Lead Channel Setting Pacing Amplitude: 2.5 V
Lead Channel Setting Pacing Pulse Width: 0.5 ms
Lead Channel Setting Sensing Sensitivity: 0.5 mV
Pulse Gen Serial Number: 9850980
Zone Setting Status: 755011

## 2023-04-02 LAB — DIGOXIN LEVEL: Digoxin, Serum: 0.7 ng/mL (ref 0.5–0.9)

## 2023-04-02 MED ORDER — METOPROLOL SUCCINATE ER 100 MG PO TB24: 100.0000 mg | ORAL_TABLET | Freq: Every day | ORAL | Status: DC

## 2023-04-02 NOTE — Patient Instructions (Signed)
 Medication Instructions:   START TAKING : METOPROLOL 100 ONCE A DAY   *If you need a refill on your cardiac medications before your next appointment, please call your pharmacy*   Lab Work:  PLEASE GO DOWN STAIRS  [facility]  FIRST FLOOR  [address] ( GET OFF ELEVATORS MAKE A LEFT AND ANOTHER LEFT LAB ON RIGHT DOWN HALLWAY : DIGOXIN TODAY   If you have labs (blood work) drawn today and your tests are completely normal, you will receive your results only by: MyChart Message (if you have MyChart) OR A paper copy in the mail If you have any lab test that is abnormal or we need to change your treatment, we will call you to review the results.   Testing/Procedures:NONE ORDERED  TODAY     Follow-Up: At [facility], you and your health needs are our priority.  As part of our continuing mission to provide you with exceptional heart care, we have created designated Provider Care Teams.  These Care Teams include your primary Cardiologist (physician) and Advanced Practice Providers (APPs -  Physician Assistants and Nurse Practitioners) who all work together to provide you with the care you need, when you need it.  We recommend signing up for the patient portal called "MyChart".  Sign up information is provided on this After Visit Summary.  MyChart is used to connect with patients for Virtual Visits (Telemedicine).  Patients are able to view lab/test results, encounter notes, upcoming appointments, etc.  Non-urgent messages can be sent to your provider as well.   To learn more about what you can do with MyChart, go to [email or web address].    Your next appointment:  ASAP WITH DR MCLEAN/ APP   1 MONTH ( CONTACT  CASSIE HALL/ ANGELINE HAMMER FOR EP SCHEDULING ISSUES )  Provider:    Loman Brooklyn, MD or Francis Dowse, PA-C    Other Instructions    1st Floor: - Lobby - Registration  - Pharmacy  - Lab - Cafe  2nd Floor: - PV Lab - Diagnostic Testing (echo, CT, nuclear  med)  3rd Floor: - Vacant  4th Floor: - TCTS (cardiothoracic surgery) - AFib Clinic - Structural Heart Clinic - Vascular Surgery  - Vascular Ultrasound  5th Floor: - HeartCare Cardiology (general and EP) - Clinical Pharmacy for coumadin, hypertension, lipid, weight-loss medications, and med management appointments    Valet parking services will be available as well.

## 2023-04-03 ENCOUNTER — Telehealth (HOSPITAL_COMMUNITY): Payer: Self-pay | Admitting: Cardiology

## 2023-04-03 NOTE — Telephone Encounter (Signed)
 Follow up made for 4/9 with pt

## 2023-04-03 NOTE — Telephone Encounter (Signed)
-----   Message from Marca Ancona sent at 04/02/2023 10:37 PM EDT ----- This patient is not taking his meds as ordered, please arrange for followup in clinic to discuss. ----- Message ----- From: Sheilah Pigeon, PA-C Sent: 04/02/2023   9:55 AM EDT To: Laurey Morale, MD; Will Jorja Loa, MD  He is regularly skipping his meds, sounds like he has pill fatigue, and reports they all make him feel terrible. + orthostatic symptoms, maybe one fal/faint after standing.   I reduced his Toprol dose felt better to have a lower dose regularly then skipping regularly. Urged to take it daily Advised he f/u with the AHF team to see to re-evaluate them as well.

## 2023-04-03 NOTE — Telephone Encounter (Signed)
-  returned call to arrange follow up  -lmom

## 2023-04-06 ENCOUNTER — Ambulatory Visit: Attending: Cardiology

## 2023-04-06 DIAGNOSIS — Z95810 Presence of automatic (implantable) cardiac defibrillator: Secondary | ICD-10-CM

## 2023-04-06 DIAGNOSIS — I5022 Chronic systolic (congestive) heart failure: Secondary | ICD-10-CM

## 2023-04-07 ENCOUNTER — Telehealth: Payer: Self-pay

## 2023-04-07 NOTE — Progress Notes (Signed)
 EPIC Encounter for ICM Monitoring  Patient Name: [name] is a 58 y.o. male Date: 04/07/2023 Primary Care Physican: Hoy Register, MD Primary Cardiologist: Shirlee Latch Electrophysiologist: Elberta Fortis 02/17/2022 Weight: 142 lbs 08/22/2022 Office Weight: 138 lbs  10/15/2022 Weight:  139 lbs 12/23/2022 Office Weight: 141 lbs 04/02/2023 Office Weight:  140 lbs                                                           Attempted call to patient and unable to reach.  Left detailed message per DPR regarding transmission.  Transmission results reviewed.     Diet:  Does not follow low salt diet.   CorVue thoracic impedance suggesting fluid levels returned to normal after recommendation to take Lasix to 80 mg daily x 3 days with 40 KCL daily x 3 days on 3/11.     Prescribed:  Furosemide 20 mg take 3 tablets (60 mg total) by mouth daily.  (Increased 3/11) Spironolactone 25 mg take 1 tablet by mouth at bedtime  Pt being followed by Maralyn Sago, EMT paramedicine program.     Labs: 04/10/2023 BMET with HF clinic 12/23/2022 Creatinine 1.15, BUN 12, Potassium 4.4, Sodium 136, GFR >60 09/18/2022 Creatinine 1.28, BUN 16, Potassium 3.7, Sodium 135, GFR >60 08/22/2022 Creatinine 1.26, BUN 20, Potassium 4.9, Sodium 132, GFR >60 A complete set of results can be found in Results Review.   Recommendations:  Left voice mail with ICM number and encouraged to call if experiencing any fluid symptoms.   Follow-up plan: ICM clinic phone appointment on 05/04/2023.    91 day device clinic remote transmission 05/11/2023.     EP/Cardiology Office Visits:   04/29/2023 with HF clinic.  05/11/2023 with Dr Shirlee Latch.  05/04/2023 with Dr Elberta Fortis.     Copy of ICM check sent to Dr. Elberta Fortis.     3 month ICM trend: 04/06/2023.    12-14 Month ICM trend:     Karie Soda, RN 04/07/2023 2:35 PM

## 2023-04-07 NOTE — Telephone Encounter (Signed)
 Remote ICM transmission received.  Attempted call to patient regarding ICM remote transmission and left detailed message per DPR.  Left ICM phone number and advised to return call for any fluid symptoms or questions. Next ICM remote transmission scheduled 05/04/2023.

## 2023-04-08 ENCOUNTER — Encounter: Payer: Self-pay | Admitting: Family Medicine

## 2023-04-09 ENCOUNTER — Ambulatory Visit
Admission: RE | Admit: 2023-04-09 | Discharge: 2023-04-09 | Disposition: A | Discharge status: Home / Self Care | Source: Ambulatory Visit | Attending: Family Medicine | Admitting: Family Medicine

## 2023-04-09 DIAGNOSIS — F17210 Nicotine dependence, cigarettes, uncomplicated: Secondary | ICD-10-CM

## 2023-04-09 DIAGNOSIS — Z87891 Personal history of nicotine dependence: Secondary | ICD-10-CM | POA: Diagnosis not present

## 2023-04-09 DIAGNOSIS — Z122 Encounter for screening for malignant neoplasm of respiratory organs: Secondary | ICD-10-CM | POA: Diagnosis not present

## 2023-04-10 ENCOUNTER — Other Ambulatory Visit (HOSPITAL_COMMUNITY)

## 2023-04-16 ENCOUNTER — Encounter: Payer: Self-pay | Admitting: Gastroenterology

## 2023-04-29 ENCOUNTER — Encounter (HOSPITAL_COMMUNITY)

## 2023-05-04 ENCOUNTER — Ambulatory Visit (INDEPENDENT_AMBULATORY_CARE_PROVIDER_SITE_OTHER)

## 2023-05-04 ENCOUNTER — Encounter: Payer: Self-pay | Admitting: Cardiology

## 2023-05-04 ENCOUNTER — Ambulatory Visit: Attending: Cardiology | Admitting: Cardiology

## 2023-05-04 VITALS — BP 120/86 | HR 96 | Ht 68.0 in | Wt 136.4 lb

## 2023-05-04 DIAGNOSIS — Z95810 Presence of automatic (implantable) cardiac defibrillator: Secondary | ICD-10-CM | POA: Diagnosis not present

## 2023-05-04 DIAGNOSIS — I471 Supraventricular tachycardia, unspecified: Secondary | ICD-10-CM

## 2023-05-04 DIAGNOSIS — I10 Essential (primary) hypertension: Secondary | ICD-10-CM | POA: Diagnosis not present

## 2023-05-04 DIAGNOSIS — I5022 Chronic systolic (congestive) heart failure: Secondary | ICD-10-CM

## 2023-05-04 DIAGNOSIS — I472 Ventricular tachycardia, unspecified: Secondary | ICD-10-CM | POA: Diagnosis not present

## 2023-05-04 LAB — CUP PACEART INCLINIC DEVICE CHECK
Battery Remaining Longevity: 56 mo
Brady Statistic RV Percent Paced: 0 %
Date Time Interrogation Session: 20250414104514
Implantable Lead Connection Status: 753985
Implantable Lead Implant Date: 20191230
Implantable Lead Location: 753860
Implantable Lead Model: 7122
Implantable Pulse Generator Implant Date: 20191230
Lead Channel Sensing Intrinsic Amplitude: 11.6 mV
Lead Channel Setting Pacing Amplitude: 2.5 V
Lead Channel Setting Pacing Pulse Width: 0.5 ms
Lead Channel Setting Sensing Sensitivity: 0.5 mV
Pulse Gen Serial Number: 9850980
Zone Setting Status: 755011

## 2023-05-04 NOTE — Progress Notes (Signed)
  Electrophysiology Office Note:   Date:  05/04/2023  ID:  [name], DOB 01/11/1966, MRN [id number]  Primary Cardiologist: Maudine Sos, MD Primary Heart Failure: Peder Bourdon, MD Electrophysiologist: Westlee Devita Cortland Ding, MD      History of Present Illness:   [name] is a 58 y.o. male with h/o hypertension, CVA, chronic systolic heart failure, polysubstance abuse seen today for routine electrophysiology followup.   Since last being seen in our clinic the patient reports doing overall well.  Able to do his daily activities without restriction.  He does say that he gets dizziness.  He does not note palpitations or other cardiac awareness.  He does get some intermittent shortness of breath.  he denies chest pain, palpitations, PND, orthopnea, nausea, vomiting, dizziness, syncope, edema, weight gain, or early satiety.   Review of systems complete and found to be negative unless listed in HPI.      EP Information / Studies Reviewed:    EKG is not ordered today. EKG from 04/02/2023 reviewed which showed this rhythm with diffuse T wave changes      ICD Interrogation-  reviewed in detail today,  See PACEART report.  Device History: Abbott Single Chamber ICD implanted 01/18/2018 for chronic systolic heart failure History of appropriate therapy: No History of AAD therapy: No   Risk Assessment/Calculations:             Physical Exam:   VS:  BP 120/86 (BP Location: Left Arm, Patient Position: Sitting, Cuff Size: Normal)   Pulse 96   Ht 5\' 8"  (1.727 m)   Wt 136 lb 6.4 oz (61.9 kg)   SpO2 97%   BMI 20.74 kg/m    Wt Readings from Last 3 Encounters:  05/04/23 136 lb 6.4 oz (61.9 kg)  04/02/23 140 lb (63.5 kg)  03/25/23 138 lb (62.6 kg)     GEN: Well nourished, well developed in no acute distress NECK: No JVD; No carotid bruits CARDIAC: Regular rate and rhythm, no murmurs, rubs, gallops RESPIRATORY:  Clear to auscultation without rales, wheezing or rhonchi   ABDOMEN: Soft, non-tender, non-distended EXTREMITIES:  No edema; No deformity   ASSESSMENT AND PLAN:    Chronic systolic dysfunction s/p Abbott single chamber ICD  euvolemic today Stable on an appropriate medical regimen Normal ICD function See Pace Art report Sensing, threshold, impedance within normal limits Device programming reviewed and appropriate Has had VT.  Monitor zone changed to 150 bpm, VT zone changed to 173 bpm with 40 beats for detection.  2.  SVT: Found on device interrogation.  Minimal symptoms.  Continue metoprolol.  3.  Hypertension: Well-controlled  4.  Obstructive sleep apnea: CPAP compliance encouraged  Disposition:   Follow up with EP APP in 6 months   Signed, Kathyleen Radice Cortland Ding, MD

## 2023-05-04 NOTE — Patient Instructions (Signed)
 Medication Instructions:  Your physician recommends that you continue on your current medications as directed. Please refer to the Current Medication list given to you today.  *If you need a refill on your cardiac medications before your next appointment, please call your pharmacy*  Lab Work: None ordered.  If you have labs (blood work) drawn today and your tests are completely normal, you will receive your results only by: MyChart Message (if you have MyChart) OR A paper copy in the mail If you have any lab test that is abnormal or we need to change your treatment, we will call you to review the results.  Testing/Procedures: None ordered.   Follow-Up: At [facility], you and your health needs are our priority.  As part of our continuing mission to provide you with exceptional heart care, our providers are all part of one team.  This team includes your primary Cardiologist (physician) and Advanced Practice Providers or APPs (Physician Assistants and Nurse Practitioners) who all work together to provide you with the care you need, when you need it.  Your next appointment:   6 months with Mertha Abrahams, PA-C      1st Floor: - Lobby - Registration  - Pharmacy  - Lab - Cafe  2nd Floor: - PV Lab - Diagnostic Testing (echo, CT, nuclear med)  3rd Floor: - Vacant  4th Floor: - TCTS (cardiothoracic surgery) - AFib Clinic - Structural Heart Clinic - Vascular Surgery  - Vascular Ultrasound  5th Floor: - HeartCare Cardiology (general and EP) - Clinical Pharmacy for coumadin, hypertension, lipid, weight-loss medications, and med management appointments    Valet parking services will be available as well.

## 2023-05-06 ENCOUNTER — Encounter: Payer: Self-pay | Admitting: Family Medicine

## 2023-05-06 ENCOUNTER — Telehealth: Payer: Self-pay

## 2023-05-06 NOTE — Telephone Encounter (Signed)
 Remote ICM transmission received.  Attempted call to patient regarding ICM remote transmission and left detailed message per DPR.  Left ICM phone number and advised to return call for any fluid symptoms or questions. Next ICM remote transmission scheduled 06/08/2023.

## 2023-05-06 NOTE — Progress Notes (Signed)
 EPIC Encounter for ICM Monitoring  Patient Name: [name] is a 58 y.o. male Date: 05/06/2023 Primary Care Physican: Joaquin Mulberry, MD Primary Cardiologist: Mitzie Anda Electrophysiologist: Lawana Pray 02/17/2022 Weight: 142 lbs 08/22/2022 Office Weight: 138 lbs  10/15/2022 Weight:  139 lbs 12/23/2022 Office Weight: 141 lbs 04/02/2023 Office Weight:  140 lbs 05/04/2023 Office Weight: 136 lbs                                                        Attempted call to patient and unable to reach.  Left detailed message per DPR regarding transmission.  Transmission results reviewed.      Diet:  Does not follow low salt diet.   CorVue thoracic impedance suggesting normal fluid levels with the exception of possible fluid accumulation from 3/27-4/1 and 4/9-4/12.     Prescribed:  Furosemide 20 mg take 3 tablets (60 mg total) by mouth daily.  (Increased 3/11) Spironolactone 25 mg take 1 tablet by mouth at bedtime  Pt being followed by Roberts Ching, EMT paramedicine program.     Labs: 12/23/2022 Creatinine 1.15, BUN 12, Potassium 4.4, Sodium 136, GFR >60 09/18/2022 Creatinine 1.28, BUN 16, Potassium 3.7, Sodium 135, GFR >60 08/22/2022 Creatinine 1.26, BUN 20, Potassium 4.9, Sodium 132, GFR >60 A complete set of results can be found in Results Review.   Recommendations:  Left voice mail with ICM number and encouraged to call if experiencing any fluid symptoms.   Follow-up plan: ICM clinic phone appointment on 06/08/2023.    91 day device clinic remote transmission 08/10/2023.     EP/Cardiology Office Visits:  05/11/2023 with Dr Mitzie Anda.  Recall 10/31/2023 with Mertha Abrahams, PA (6 month f/u).     Copy of ICM check sent to Dr. Lawana Pray.     3 month ICM trend: 05/04/2023.    12-14 Month ICM trend:     Almyra Jain, RN 05/06/2023 8:46 AM

## 2023-05-08 ENCOUNTER — Telehealth (HOSPITAL_COMMUNITY): Payer: Self-pay | Admitting: Cardiology

## 2023-05-08 NOTE — Telephone Encounter (Signed)
 Called to confirm/remind patient of their appointment at the [facility] on 05/08/23.   Appointment:   [x] Confirmed  [] Left mess   [] No answer/No voice mail  [] VM Full/unable to leave message  [] Phone not in service  Patient reminded to bring all medications and/or complete list.  Confirmed patient has transportation. Gave directions, instructed to utilize valet parking.

## 2023-05-11 ENCOUNTER — Encounter (HOSPITAL_COMMUNITY): Payer: Self-pay | Admitting: Cardiology

## 2023-05-11 ENCOUNTER — Ambulatory Visit (HOSPITAL_COMMUNITY)
Admission: RE | Admit: 2023-05-11 | Discharge: 2023-05-11 | Disposition: A | Discharge status: Home / Self Care | Source: Ambulatory Visit | Attending: Cardiology | Admitting: Cardiology

## 2023-05-11 ENCOUNTER — Ambulatory Visit: Payer: Medicaid Other

## 2023-05-11 VITALS — BP 104/60 | HR 102 | Wt 139.0 lb

## 2023-05-11 DIAGNOSIS — I2510 Atherosclerotic heart disease of native coronary artery without angina pectoris: Secondary | ICD-10-CM | POA: Insufficient documentation

## 2023-05-11 DIAGNOSIS — I5022 Chronic systolic (congestive) heart failure: Secondary | ICD-10-CM | POA: Insufficient documentation

## 2023-05-11 DIAGNOSIS — F17210 Nicotine dependence, cigarettes, uncomplicated: Secondary | ICD-10-CM | POA: Diagnosis not present

## 2023-05-11 DIAGNOSIS — I472 Ventricular tachycardia, unspecified: Secondary | ICD-10-CM

## 2023-05-11 DIAGNOSIS — I110 Hypertensive heart disease with heart failure: Secondary | ICD-10-CM | POA: Diagnosis not present

## 2023-05-11 DIAGNOSIS — I5042 Chronic combined systolic (congestive) and diastolic (congestive) heart failure: Secondary | ICD-10-CM

## 2023-05-11 DIAGNOSIS — Z95810 Presence of automatic (implantable) cardiac defibrillator: Secondary | ICD-10-CM | POA: Insufficient documentation

## 2023-05-11 DIAGNOSIS — I428 Other cardiomyopathies: Secondary | ICD-10-CM | POA: Insufficient documentation

## 2023-05-11 DIAGNOSIS — J449 Chronic obstructive pulmonary disease, unspecified: Secondary | ICD-10-CM | POA: Diagnosis not present

## 2023-05-11 DIAGNOSIS — Z79899 Other long term (current) drug therapy: Secondary | ICD-10-CM | POA: Insufficient documentation

## 2023-05-11 DIAGNOSIS — M79675 Pain in left toe(s): Secondary | ICD-10-CM | POA: Diagnosis not present

## 2023-05-11 DIAGNOSIS — Z7984 Long term (current) use of oral hypoglycemic drugs: Secondary | ICD-10-CM | POA: Insufficient documentation

## 2023-05-11 LAB — BASIC METABOLIC PANEL WITH GFR
Anion gap: 15 (ref 5–15)
BUN: 15 mg/dL (ref 6–20)
CO2: 24 mmol/L (ref 22–32)
Calcium: 9.9 mg/dL (ref 8.9–10.3)
Chloride: 97 mmol/L — ABNORMAL LOW (ref 98–111)
Creatinine, Ser: 1.19 mg/dL (ref 0.61–1.24)
GFR, Estimated: >60 mL/min (ref >60)
Glucose, Bld: 170 mg/dL — ABNORMAL HIGH (ref 70–99)
Potassium: 4 mmol/L (ref 3.5–5.1)
Sodium: 136 mmol/L (ref 135–145)

## 2023-05-11 LAB — CUP PACEART REMOTE DEVICE CHECK
Battery Remaining Longevity: 55 mo
Battery Remaining Percentage: 55 %
Battery Voltage: 2.95 V
Brady Statistic RV Percent Paced: 0 %
Date Time Interrogation Session: 20250421020015
HighPow Impedance: 65 Ohm
HighPow Impedance: 65 Ohm
Implantable Lead Connection Status: 753985
Implantable Lead Implant Date: 20191230
Implantable Lead Location: 753860
Implantable Lead Model: 7122
Implantable Pulse Generator Implant Date: 20191230
Lead Channel Impedance Value: 450 Ohm
Lead Channel Pacing Threshold Amplitude: 0.5 V
Lead Channel Pacing Threshold Pulse Width: 0.5 ms
Lead Channel Sensing Intrinsic Amplitude: 11.1 mV
Lead Channel Setting Pacing Amplitude: 2.5 V
Lead Channel Setting Pacing Pulse Width: 0.5 ms
Lead Channel Setting Sensing Sensitivity: 0.5 mV
Pulse Gen Serial Number: 9850980
Zone Setting Status: 755011

## 2023-05-11 LAB — LIPID PANEL
Cholesterol: 183 mg/dL (ref 0–200)
HDL: 123 mg/dL (ref >40)
LDL Cholesterol: 33 mg/dL (ref 0–99)
Total CHOL/HDL Ratio: 1.5 ratio
Triglycerides: 135 mg/dL (ref <150)
VLDL: 27 mg/dL (ref 0–40)

## 2023-05-11 LAB — DIGOXIN LEVEL: Digoxin Level: <0.2 ng/mL — ABNORMAL LOW (ref 0.8–2.0)

## 2023-05-11 LAB — BRAIN NATRIURETIC PEPTIDE: B Natriuretic Peptide: 65.1 pg/mL (ref 0.0–100.0)

## 2023-05-11 LAB — URIC ACID: Uric Acid, Serum: 10.2 mg/dL — ABNORMAL HIGH (ref 3.7–8.6)

## 2023-05-11 MED ORDER — COLCHICINE 0.6 MG PO TABS: 0.6000 mg | ORAL_TABLET | Freq: Every day | ORAL | 3 refills | Status: DC | PRN

## 2023-05-11 MED ORDER — ENTRESTO 49-51 MG PO TABS: 1.0000 | ORAL_TABLET | Freq: Two times a day (BID) | ORAL | 11 refills | Status: DC

## 2023-05-11 NOTE — Patient Instructions (Signed)
 DECREASE Entresto  to 49/51 mg Twice daily  START Colchicine   .6 mg daily as needed for gout.  Labs done today, your results will be available in MyChart, we will contact you for abnormal readings.  Your provider has ordered an in lab sleep study for you. You will be called to have this test arranged.  You have been referred to Dr. Lawana Pray. His office will call you to arrange your appointment.  Your physician recommends that you schedule a follow-up appointment in: 6 weeks.  If you have any questions or concerns before your next appointment please send us  a message through [place] or call our office at [phone].    TO LEAVE A MESSAGE FOR THE NURSE SELECT OPTION 2, PLEASE LEAVE A MESSAGE INCLUDING: YOUR NAME DATE OF BIRTH CALL BACK NUMBER REASON FOR CALL**this is important as we prioritize the call backs  YOU WILL RECEIVE A CALL BACK THE SAME DAY AS LONG AS YOU CALL BEFORE 4:00 PM  At the [facility], you and your health needs are our priority. As part of our continuing mission to provide you with exceptional heart care, we have created designated Provider Care Teams. These Care Teams include your primary Cardiologist (physician) and Advanced Practice Providers (APPs- Physician Assistants and Nurse Practitioners) who all work together to provide you with the care you need, when you need it.   You may see any of the following providers on your designated Care Team at your next follow up: Dr Jules Oar Dr Peder Bourdon Dr. Alwin Baars Dr. Arta Lark Amy Marijane Shoulders, NP Ruddy Corral, [place] [facility] [place], [place] Dennise Fitz, NP [place] Lee, NP Shawnee Dellen, NP Luster Salters, PharmD Bevely Brush, PharmD   Please be sure to bring in all your medications bottles to every appointment.    Thank you for choosing [facility]

## 2023-05-11 NOTE — Progress Notes (Signed)
 [facility] Progress Note    PCP: Joaquin Mulberry, MD HF Cardiology: Dr. Mitzie Anda  Chief complaint: CHF  58 y.o. with history of nonischemic cardiomyopathy was referred by Slater Duncan for evaluation of CHF.  Cardiomyopathy has been diagnosed since 2016 when echo showed EF 20%.  He has a history of cocaine use and ETOH abuse. Currently, drinking about 3 beers/day.  He is smoking.  No recent cocaine.  He had a stroke in 2017 in setting of uncontrolled HTN and cocaine abuse.  He has a Secondary school teacher ICD.  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  Last echo in 2/23 showed EF < 20%, severe LV dilation, moderately decreased RV systolic function with mild RV enlargement. He has 13 siblings, none of whom have known cardiac disease.  His grandmother had CHF and there apparently was conversation about a heart transplant but she passed away.  CPX 11/04/2023) showed severe functional limitation due to HF and deconditioning.   Cardiac MRI 10/23 (difficult images due to ICD artifact) LVEF 24%, RVEF 34%.  Echo 9/24 showed EF 25%, RV mildly reduced, prominent LV apical trabeculation  Today he returns for HF follow up. Weight down 2 lbs.  He skips medications at times but has meds in bubble packs and says that he is taking everything currently but has not taken morning meds yet.  No chest pain. Short of breath walking longer distances.  Lightheaded with standing, feels like he is dizzy "a lot."  Short of breath with stairs. No orthopnea/PND.  He snores and has daytime sleepiness. Still smoking 1/2 ppd. Having severe pain in his left big toe.   St Jude device interrogation: No VT, stable thoracic impedance.   ECG (personally reviewed): sinus tachy 116 bpm, LVH with repolarization abnormality  Labs (6/23): hgb 15.6, BNP 673, K 4.2, creatinine 1.23 Labs (8/23): K 3.9, creatinine 1.13 Labs 11-04-2023): K 4.8, creatinine 1.53 Labs (10/23): K 3.6, creatinine 1.07 Labs (11/23): K 4.3, creatinine  1.4, BNP 152 Labs (5/23): 4.3, creatinine 1.24 Lab (12/23): K 4.3, creatinine 1.24  Labs (3/24): K 3.6, creatinine 0.96 Labs (4/24): BNP 488, K 3.6, creatinine 1.11 Labs (8/24): K 3.7, creatinine 1.28 Labs (12/24): K 4.4, creatinine 1.15  PMH: 1. H/o seizure disorder: ?ETOH. 2. HTN 3. 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  4. H/o SVT 5. OSA 6. COPD: Active smoker - CT chest in 3/25 with mild emphysema 7. VT: St Jude ICD.  ATP 6/23.  8. Chronic systolic CHF: Nonischemic cardiomyopathy.  Documented since at least 2016. St Jude ICD.  - Echo (3/16): EF 20% - Echo (2/23): EF < 20%, severe LV dilation, moderately decreased RV systolic function with mild RV enlargement.  - Coronary CTA (6/23): 81st percentile calcium  score, mild nonobstructive CAD.  - CPX 11/04/23): Peak VO2: 14.3 (43% predicted peak VO2), VE/VCO2 slope: 39, OUES: 1.10, Peak RER: 0.70  - Cardiac MRI 10/23 (difficult images due to ICD artifact) LVEF 24%, RVEF 34%. - Echo (9/24): EF 255, RV mildly reduced, prominent LV apical trabeculation 9. Prior h/o cocaine  Social History   Socioeconomic History   Marital status: Single    Spouse name: Not on file   Number of children: 1   Years of education: 12   Highest education level: Not on file  Occupational History   Not on file  Tobacco Use   Smoking status: Every Day    Current packs/day: 0.50    Average packs/day: 0.5 packs/day for 20.0 years (10.0  ttl pk-yrs)    Types: Cigarettes   Smokeless tobacco: Never   Tobacco comments:    02/25/22 down to 1/2 pack a day  Vaping Use   Vaping status: Never Used  Substance and Sexual Activity   Alcohol use: Yes    Alcohol/week: 1.0 standard drink of alcohol    Types: 1 Cans of beer per week    Comment: socially    Drug use: Not Currently    Types: Cocaine    Comment: stopped using cocaine 11/19/14   Sexual activity: Not on file  Other Topics Concern   Not on file  Social History Narrative   Lives alone, nurse  comes 2 hrs daily   Social Drivers of Health   Financial Resource Strain: Low Risk  (03/25/2023)   Overall Financial Resource Strain (CARDIA)    Difficulty of Paying Living Expenses: Not very hard  Food Insecurity: No Food Insecurity (03/25/2023)   Hunger Vital Sign    Worried About Running Out of Food in the Last Year: Never true    Ran Out of Food in the Last Year: Never true  Transportation Needs: No Transportation Needs (03/25/2023)   PRAPARE - Administrator, Civil Service (Medical): No    Lack of Transportation (Non-Medical): No  Physical Activity: Inactive (03/25/2023)   Exercise Vital Sign    Days of Exercise per Week: 0 days    Minutes of Exercise per Session: 0 min  Stress: No Stress Concern Present (03/25/2023)   [employer] of Occupational Health - Occupational Stress Questionnaire    Feeling of Stress : Not at all  Social Connections: Moderately Integrated (03/25/2023)   Social Connection and Isolation Panel [NHANES]    Frequency of Communication with Friends and Family: More than three times a week    Frequency of Social Gatherings with Friends and Family: More than three times a week    Attends Religious Services: More than 4 times per year    Active Member of [employer] or Organizations: Yes    Attends Banker Meetings: Never    Marital Status: Never married  Intimate Partner Violence: Not At Risk (03/25/2023)   Humiliation, Afraid, Rape, and Kick questionnaire    Fear of Current or Ex-Partner: No    Emotionally Abused: No    Physically Abused: No    Sexually Abused: No   Family History  Problem Relation Age of Onset   Hypertension Mother    Hyperlipidemia Mother    Hypertension Father    Stroke Maternal Aunt    Hypertension Sister    Hypertension Brother    ROS: All systems reviewed and negative except as per HPI.   Current Outpatient Medications  Medication Sig Dispense Refill   ADVAIR  DISKUS 250-50 MCG/ACT AEPB Inhale 1 puff into the  lungs 2 (two) times daily. in the morning and at bedtime. 180 each 1   albuterol  (PROAIR  HFA) 108 (90 Base) MCG/ACT inhaler Inhale 2 puffs into the lungs every 6 (six) hours as needed for shortness of breath. 18 g 6   aspirin  81 MG EC tablet Take 81 mg by mouth in the morning. Swallow whole.     atorvastatin  (LIPITOR) 40 MG tablet Take 1 tablet (40 mg total) by mouth daily. 90 tablet 1   Blood Pressure Monitoring (BLOOD PRESSURE CUFF) MISC Take daily blood pressures 1 each 0   buPROPion  (WELLBUTRIN  SR) 150 MG 12 hr tablet Take 150 mg by mouth 2 (two) times daily.  colchicine  0.6 MG tablet Take 1 tablet (0.6 mg total) by mouth daily as needed (gout). 15 tablet 3   dapagliflozin  propanediol (FARXIGA ) 10 MG TABS tablet TAKE 1 TABLET (10 MG TOTAL) BY MOUTH DAILY BEFORE BREAKFAST. 30 tablet 11   digoxin  (LANOXIN ) 0.125 MG tablet TAKE 1 TABLET (0.125 MG TOTAL) BY MOUTH DAILY. (AM) 90 tablet 1   furosemide  (LASIX ) 20 MG tablet Take 3 tablets (60 mg total) by mouth daily. 270 tablet 2   magnesium  oxide (MAG-OX) 400 (240 Mg) MG tablet TAKE 1 TABLET (400 MG TOTAL) BY MOUTH DAILY. (AM) 90 tablet 3   metoprolol  succinate (TOPROL  XL) 100 MG 24 hr tablet Take 1 tablet (100 mg total) by mouth daily.     mirtazapine  (REMERON  SOL-TAB) 15 MG disintegrating tablet Take 1 tablet (15 mg total) by mouth at bedtime. 90 tablet 1   sacubitril -valsartan  (ENTRESTO ) 49-51 MG Take 1 tablet by mouth 2 (two) times daily. 60 tablet 11   spironolactone  (ALDACTONE ) 25 MG tablet TAKE 1 TABLET (25 MG TOTAL) BY MOUTH AT BEDTIME (PM) 90 tablet 3   tiotropium (SPIRIVA  HANDIHALER) 18 MCG inhalation capsule Place 1 capsule (18 mcg total) into inhaler and inhale daily. 90 capsule 1   No current facility-administered medications for this encounter.   Wt Readings from Last 3 Encounters:  05/11/23 63 kg (139 lb)  05/04/23 61.9 kg (136 lb 6.4 oz)  04/02/23 63.5 kg (140 lb)   BP 104/60   Pulse (!) 102   Wt 63 kg (139 lb)   SpO2 95%    BMI 21.13 kg/m  General: NAD Neck: No JVD, no thyromegaly or thyroid nodule.  Lungs: Occasional rhonchi CV: Nondisplaced PMI.  Heart regular S1/S2, no S3/S4, no murmur.  No peripheral edema.  No carotid bruit.  Normal pedal pulses.  Abdomen: Soft, nontender, no hepatosplenomegaly, no distention.  Skin: Intact without lesions or rashes.  Neurologic: Alert and oriented x 3.  Psych: Normal affect. Extremities: No clubbing or cyanosis. Tender left MTP joint HEENT: Normal.   Assessment/Plan: 1. Chronic systolic CHF: Nonischemic cardiomyopathy.  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  Cause of CMP uncertain => prior cocaine abuse but not currently.  He drinks about 3 beers/day, denies heavier drinking in past but apparently there was concern that he had had ETOH withdrawal seizures in the past.  His grandmother had CHF but apparently none of his 13 siblings have known cardiac disease. EF has been low since at least 2016.  Echo in 2/23 showed EF < 20%, severe LV dilation, moderately decreased RV systolic function with mild RV enlargement. He has a Secondary school teacher ICD.  CPX 9/23 showed severe functional limitation from both HF and deconditioning. Cardiac MRI 10/23, technically difficult study due to ICD artifact, LVEF 24%, no evidence of infiltrative disease. Echo 9/24 showed EF 25%. NYHA class III symptoms chronically (suspect COPD plays a role in addition to CHF). Not volume overloaded on exam or by Corvue.  - Continue Lasix  60 mg daily, BMET/BNP today.  - Continue Farxiga  10 mg daily. - With lightheadedness and SBP 104 before taking his morning meds, I will decrease Entresto  to 49/51 bid.   - Continue digoxin  0.125 mg daily. Check level today.  - Continue Toprol  XL 100 mg daily. - Continue spironolactone  25 mg daily.   - Keep ETOH minimal. - He could not get insurance coverage for barostimulator.  I will send him to Dr. Lawana Pray to see if he can get a cardiac  contractility modulator.  - We  have discussed advanced therapies. Not transplant candidate with on-going tobacco use.  Would likely be VAD candidate if needed.  2. COPD: He has cut back but is still smoking.  Failed nicotine  patches. Failed Chantix  (stopped due to nightmares). Unable to tolerate Wellbutrin .  3. Suspect OSA: I will order a sleep study.  4. Gout: I suspect 1st left MTP pain/tenderness is gout-related.  - Check uric acid.  - I will give him colchicine  to use prn gout pain.  - Consider allopurinol in the future.   Follow up in 6 wks with APP.   I spent 32 minutes reviewing records, interviewing/examining patient, and managing orders.    Peder Bourdon,  05/11/2023

## 2023-05-12 ENCOUNTER — Telehealth (HOSPITAL_COMMUNITY): Payer: Self-pay | Admitting: *Deleted

## 2023-05-12 ENCOUNTER — Other Ambulatory Visit: Payer: Self-pay

## 2023-05-12 NOTE — Telephone Encounter (Signed)
 This is a CHF pt

## 2023-05-12 NOTE — Telephone Encounter (Signed)
 Called patient per Dr. Mitzie Anda with following lab results;  "Good lipids.  Uric acid high, suspect gout.  I gave him colchicine  to take today. Digoxin  level is low, make sure he is actually taking it."  Pt verbalized understanding of above. He confirms he is taking his Digoxin  daily.

## 2023-05-13 MED ORDER — METOPROLOL SUCCINATE ER 100 MG PO TB24: 100.0000 mg | ORAL_TABLET | Freq: Every day | ORAL | Status: AC

## 2023-06-08 ENCOUNTER — Ambulatory Visit: Attending: Cardiology

## 2023-06-09 ENCOUNTER — Telehealth: Payer: Self-pay

## 2023-06-09 NOTE — Progress Notes (Signed)
 Cole Ike, RN  Ashara Lounsbury, [name], RN; P Cv Div Heartcare Device Looks like this has been ongoing for this Pt for sometime.  The EGM available is SVT. Looks like Pt has close follow up with Heart Failure, and issues with BP being soft.   I don't think we need to do anything additional on our end.  Heart Failure will reach out to Dr. Lawana Pray if they need assistance.  Looks like they are thinking about an LVAD but he smokes, etc. [name]

## 2023-06-09 NOTE — Telephone Encounter (Signed)
 Remote ICM transmission received.  Attempted call to patient regarding ICM remote transmission and no answer.

## 2023-06-09 NOTE — Progress Notes (Signed)
 EPIC Encounter for ICM Monitoring  Patient Name: [name] is a 58 y.o. male Date: 06/09/2023 Primary Care Physican: Joaquin Mulberry, MD Primary Cardiologist: Mitzie Anda Electrophysiologist: Lawana Pray 02/17/2022 Weight: 142 lbs 08/22/2022 Office Weight: 138 lbs  10/15/2022 Weight:  139 lbs 12/23/2022 Office Weight: 141 lbs 04/02/2023 Office Weight:  140 lbs 05/04/2023 Office Weight: 136 lbs   05/11/2023 Office Weight: 139 lbs                                                      Attempted call to patient and unable to reach.   Transmission results reviewed.      Diet:  Does not follow low salt diet.   CorVue thoracic impedance suggesting possible dryness from 4/25-5/1.  Suggesting possible fluid accumulation from 5/1-5/12 and 5/14-5/17.  Message sent to device clinic triage 5/19 regarding SVT 60 episodes and NVST 30 episodes.   Prescribed:  Furosemide  20 mg take 3 tablets (60 mg total) by mouth daily.  (Increased 3/11) Spironolactone  25 mg take 1 tablet by mouth at bedtime  Pt being followed by Roberts Ching, EMT paramedicine program.     Labs: 12/23/2022 Creatinine 1.15, BUN 12, Potassium 4.4, Sodium 136, GFR >60 09/18/2022 Creatinine 1.28, BUN 16, Potassium 3.7, Sodium 135, GFR >60 08/22/2022 Creatinine 1.26, BUN 20, Potassium 4.9, Sodium 132, GFR >60 A complete set of results can be found in Results Review.   Recommendations: Unable to reach.     Follow-up plan: ICM clinic phone appointment on 08/11/2023.    91 day device clinic remote transmission 08/10/2023.     EP/Cardiology Office Visits:  06/22/2023 with HF Clinic.  06/24/2023 with [name], PA (6 month f/u).     Copy of ICM check sent to Dr. Lawana Pray.     3 month ICM trend: 06/08/2023.    12-14 Month ICM trend:     Almyra Jain, RN 06/09/2023 12:40 PM

## 2023-06-11 ENCOUNTER — Ambulatory Visit: Admitting: Gastroenterology

## 2023-06-19 ENCOUNTER — Telehealth (HOSPITAL_COMMUNITY): Payer: Self-pay

## 2023-06-19 NOTE — Progress Notes (Signed)
 Advanced Heart Failure Clinic Note    PCP: Joaquin Mulberry, MD HF Cardiology: Dr. Mitzie Anda  58 y.o. with history of nonischemic cardiomyopathy was referred by Slater Duncan for evaluation of CHF.  Cardiomyopathy has been diagnosed since 2016 when echo showed EF 20%.  He has a history of cocaine use and ETOH abuse. Currently, drinking about 3 beers/day.  He is smoking.  No recent cocaine.  He had a stroke in 2017 in setting of uncontrolled HTN and cocaine abuse.  He has a Secondary school teacher ICD.  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  Last echo in 2/23 showed EF < 20%, severe LV dilation, moderately decreased RV systolic function with mild RV enlargement. He has 13 siblings, none of whom have known cardiac disease.  His grandmother had CHF and there apparently was conversation about a heart transplant but she passed away.  CPX 10-31-23) showed severe functional limitation due to HF and deconditioning.   Cardiac MRI 10/23 (difficult images due to ICD artifact) LVEF 24%, RVEF 34%.  Echo 9/24 showed EF 25%, RV mildly reduced, prominent LV apical trabeculation  Today he returns for HF follow up. Overall feeling fine. Main complaint is gout in right toe. Took colchicine  without relief, had diarrhea.  He is SOB walking further distances on flat ground and walking up steps. He remains dizzy, no passing out. Remains fatigued. Denies palpitations, abnormal bleeding, CP, edema, or PND. He has new orthopnea. Appetite ok. Weight at home 144 pounds. Taking all medications, uses bubble packs. Smoking 1/2 ppd.  St Jude device interrogation (personally reviewed): impedence down,  <1% VP, multiple NSVT and SVT episodes sine 06/10/23, longest episode lasting 24 mins  ECG (personally reviewed): none ordered today.  Labs (3/24): K 3.6, creatinine 0.96 Labs (4/24): BNP 488, K 3.6, creatinine 1.11 Labs (8/24): K 3.7, creatinine 1.28 Labs (12/24): K 4.4, creatinine 1.15 Labs (4/25): K 4.0, creatinine 1.19, LDL  33  PMH: 1. H/o seizure disorder: ?ETOH. 2. HTN 3. 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  4. H/o SVT 5. OSA 6. COPD: Active smoker - CT chest in 3/25 with mild emphysema 7. VT: St Jude ICD.  ATP 6/23.  8. Chronic systolic CHF: Nonischemic cardiomyopathy.  Documented since at least 2016. St Jude ICD.  - Echo (3/16): EF 20% - Echo (2/23): EF < 20%, severe LV dilation, moderately decreased RV systolic function with mild RV enlargement.  - Coronary CTA (6/23): 81st percentile calcium  score, mild nonobstructive CAD.  - CPX 10/31/23): Peak VO2: 14.3 (43% predicted peak VO2), VE/VCO2 slope: 39, OUES: 1.10, Peak RER: 0.70  - Cardiac MRI 10/23 (difficult images due to ICD artifact) LVEF 24%, RVEF 34%. - Echo (9/24): EF 255, RV mildly reduced, prominent LV apical trabeculation 9. Prior h/o cocaine  Social History   Socioeconomic History   Marital status: Single    Spouse name: Not on file   Number of children: 1   Years of education: 12   Highest education level: Not on file  Occupational History   Not on file  Tobacco Use   Smoking status: Every Day    Current packs/day: 0.50    Average packs/day: 0.5 packs/day for 20.0 years (10.0 ttl pk-yrs)    Types: Cigarettes   Smokeless tobacco: Never   Tobacco comments:    02/25/22 down to 1/2 pack a day  Vaping Use   Vaping status: Never Used  Substance and Sexual Activity   Alcohol use: Yes    Alcohol/week: 1.0 standard  drink of alcohol    Types: 1 Cans of beer per week    Comment: socially    Drug use: Not Currently    Types: Cocaine    Comment: stopped using cocaine 11/19/14   Sexual activity: Not on file  Other Topics Concern   Not on file  Social History Narrative   Lives alone, nurse comes 2 hrs daily   Social Drivers of Health   Financial Resource Strain: Low Risk  (03/25/2023)   Overall Financial Resource Strain (CARDIA)    Difficulty of Paying Living Expenses: Not very hard  Food Insecurity: No Food Insecurity  (03/25/2023)   Hunger Vital Sign    Worried About Running Out of Food in the Last Year: Never true    Ran Out of Food in the Last Year: Never true  Transportation Needs: No Transportation Needs (03/25/2023)   PRAPARE - Administrator, Civil Service (Medical): No    Lack of Transportation (Non-Medical): No  Physical Activity: Inactive (03/25/2023)   Exercise Vital Sign    Days of Exercise per Week: 0 days    Minutes of Exercise per Session: 0 min  Stress: No Stress Concern Present (03/25/2023)   [employer] of Occupational Health - Occupational Stress Questionnaire    Feeling of Stress : Not at all  Social Connections: Moderately Integrated (03/25/2023)   Social Connection and Isolation Panel [NHANES]    Frequency of Communication with Friends and Family: More than three times a week    Frequency of Social Gatherings with Friends and Family: More than three times a week    Attends Religious Services: More than 4 times per year    Active Member of [employer] or Organizations: Yes    Attends Banker Meetings: Never    Marital Status: Never married  Intimate Partner Violence: Not At Risk (03/25/2023)   Humiliation, Afraid, Rape, and Kick questionnaire    Fear of Current or Ex-Partner: No    Emotionally Abused: No    Physically Abused: No    Sexually Abused: No   Family History  Problem Relation Age of Onset   Hypertension Mother    Hyperlipidemia Mother    Hypertension Father    Stroke Maternal Aunt    Hypertension Sister    Hypertension Brother    ROS: All systems reviewed and negative except as per HPI.   Current Outpatient Medications  Medication Sig Dispense Refill   ADVAIR  DISKUS 250-50 MCG/ACT AEPB Inhale 1 puff into the lungs 2 (two) times daily. in the morning and at bedtime. 180 each 1   albuterol  (PROAIR  HFA) 108 (90 Base) MCG/ACT inhaler Inhale 2 puffs into the lungs every 6 (six) hours as needed for shortness of breath. 18 g 6   aspirin  81 MG EC  tablet Take 81 mg by mouth in the morning. Swallow whole.     atorvastatin  (LIPITOR) 40 MG tablet Take 1 tablet (40 mg total) by mouth daily. 90 tablet 1   buPROPion  (WELLBUTRIN  SR) 150 MG 12 hr tablet Take 150 mg by mouth 2 (two) times daily.     colchicine  0.6 MG tablet Take 1 tablet (0.6 mg total) by mouth daily as needed (gout). 15 tablet 3   dapagliflozin  propanediol (FARXIGA ) 10 MG TABS tablet TAKE 1 TABLET (10 MG TOTAL) BY MOUTH DAILY BEFORE BREAKFAST. 30 tablet 11   digoxin  (LANOXIN ) 0.125 MG tablet TAKE 1 TABLET (0.125 MG TOTAL) BY MOUTH DAILY. (AM) 90 tablet 1   furosemide  (  LASIX ) 20 MG tablet Take 3 tablets (60 mg total) by mouth daily. 270 tablet 2   magnesium  oxide (MAG-OX) 400 (240 Mg) MG tablet TAKE 1 TABLET (400 MG TOTAL) BY MOUTH DAILY. (AM) 90 tablet 3   metoprolol  succinate (TOPROL  XL) 100 MG 24 hr tablet Take 1 tablet (100 mg total) by mouth daily.     mirtazapine  (REMERON  SOL-TAB) 15 MG disintegrating tablet Take 1 tablet (15 mg total) by mouth at bedtime. 90 tablet 1   sacubitril -valsartan  (ENTRESTO ) 49-51 MG Take 1 tablet by mouth 2 (two) times daily. 60 tablet 11   spironolactone  (ALDACTONE ) 25 MG tablet TAKE 1 TABLET (25 MG TOTAL) BY MOUTH AT BEDTIME (PM) 90 tablet 3   tiotropium (SPIRIVA  HANDIHALER) 18 MCG inhalation capsule Place 1 capsule (18 mcg total) into inhaler and inhale daily. 90 capsule 1   Blood Pressure Monitoring (BLOOD PRESSURE CUFF) MISC Take daily blood pressures (Patient not taking: Reported on 06/22/2023) 1 each 0   No current facility-administered medications for this encounter.   Wt Readings from Last 3 Encounters:  06/22/23 64.8 kg (142 lb 12.8 oz)  05/11/23 63 kg (139 lb)  05/04/23 61.9 kg (136 lb 6.4 oz)   BP 96/68   Pulse 78   Wt 64.8 kg (142 lb 12.8 oz)   SpO2 98%   BMI 21.71 kg/m  Physical Exam General:  NAD. No resp difficulty, walked into clinic HEENT: Normal Neck: Supple. JVP 8-10 Cor: Regular rate & rhythm. No rubs, gallops or  murmurs. Lungs: Clear Abdomen: Soft, nontender, +distended.  Extremities: No cyanosis, clubbing, rash, edema; tender right MTP joint Neuro: Alert & oriented x 3, moves all 4 extremities w/o difficulty. Affect pleasant.  Assessment/Plan: 1. Chronic systolic CHF: Nonischemic cardiomyopathy.  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  Cause of CMP uncertain => prior cocaine abuse but not currently.  He drinks about 3 beers/day, denies heavier drinking in past but apparently there was concern that he had had ETOH withdrawal seizures in the past.  His grandmother had CHF but apparently none of his 13 siblings have known cardiac disease. EF has been low since at least 2016.  Echo in 2/23 showed EF < 20%, severe LV dilation, moderately decreased RV systolic function with mild RV enlargement. He has a Secondary school teacher ICD.  CPX 9/23 showed severe functional limitation from both HF and deconditioning. Cardiac MRI 10/23, technically difficult study due to ICD artifact, LVEF 24%, no evidence of infiltrative disease. Echo 9/24 showed EF 25%. NYHA class III symptoms chronically (suspect COPD plays a role in addition to CHF).  Mildly volume up on exam and CorVue. - With orthostasis, decrease Entresto  to 24/26 mg bid. - Increase Lasix  to 80 mg daily. BMET/BNP today, repeat BMET in 10-14 days. - Continue Farxiga  10 mg daily. - Continue digoxin  0.125 mg daily. Check level today.  - Continue Toprol  XL 100 mg daily. - Continue spironolactone  25 mg daily.   - Keep ETOH minimal. - He could not get insurance coverage for barostimulator. He has been referred to Dr. Lawana Pray to see if he can get a cardiac contractility modulator.  - We have discussed advanced therapies. Not transplant candidate with on-going tobacco use.  Would likely be VAD candidate if needed.  2. COPD: He has cut back but is still smoking 1/2 ppd. - Failed nicotine  patches.  - Failed Chantix  (stopped due to nightmares).  - Unable to tolerate  Wellbutrin .  3. Suspect OSA: Sleep study has been arranged.  4. Gout: 1st right MTP tenderness, check uric acid. - He has PRN colchicine . - Will start allopurinol 100 mg daily. Renal function has been stable. I asked him to reach out to PCP for appt for on-going management.  Follow up in 3 months with Dr. Mertie Abt, FNP-BC 06/22/2023

## 2023-06-19 NOTE — Telephone Encounter (Signed)
 Called to confirm/remind patient of their appointment at the [facility] Failure Clinic on 06/22/23.   Appointment:   [x] Confirmed  [] Left mess   [] No answer/No voice mail  [] VM Full/unable to leave message  [] Phone not in service  Patient reminded to bring all medications and/or complete list.  Confirmed patient has transportation. Gave directions, instructed to utilize valet parking.

## 2023-06-22 ENCOUNTER — Other Ambulatory Visit: Payer: Self-pay | Admitting: Cardiology

## 2023-06-22 ENCOUNTER — Encounter (HOSPITAL_BASED_OUTPATIENT_CLINIC_OR_DEPARTMENT_OTHER): Payer: Self-pay

## 2023-06-22 ENCOUNTER — Telehealth: Payer: Self-pay | Admitting: Cardiology

## 2023-06-22 ENCOUNTER — Ambulatory Visit (HOSPITAL_COMMUNITY)
Admission: RE | Admit: 2023-06-22 | Discharge: 2023-06-22 | Disposition: A | Discharge status: Home / Self Care | Source: Ambulatory Visit | Attending: Family Medicine | Admitting: Family Medicine

## 2023-06-22 ENCOUNTER — Encounter (HOSPITAL_COMMUNITY): Payer: Self-pay

## 2023-06-22 ENCOUNTER — Other Ambulatory Visit (HOSPITAL_COMMUNITY): Payer: Self-pay | Admitting: Family Medicine

## 2023-06-22 ENCOUNTER — Ambulatory Visit (HOSPITAL_COMMUNITY): Payer: Self-pay | Admitting: Family Medicine

## 2023-06-22 VITALS — BP 96/68 | HR 78 | Wt 142.8 lb

## 2023-06-22 DIAGNOSIS — F1010 Alcohol abuse, uncomplicated: Secondary | ICD-10-CM | POA: Insufficient documentation

## 2023-06-22 DIAGNOSIS — M109 Gout, unspecified: Secondary | ICD-10-CM | POA: Diagnosis not present

## 2023-06-22 DIAGNOSIS — F1411 Cocaine abuse, in remission: Secondary | ICD-10-CM | POA: Diagnosis not present

## 2023-06-22 DIAGNOSIS — I5022 Chronic systolic (congestive) heart failure: Secondary | ICD-10-CM | POA: Diagnosis not present

## 2023-06-22 DIAGNOSIS — I2510 Atherosclerotic heart disease of native coronary artery without angina pectoris: Secondary | ICD-10-CM | POA: Diagnosis not present

## 2023-06-22 DIAGNOSIS — J449 Chronic obstructive pulmonary disease, unspecified: Secondary | ICD-10-CM | POA: Diagnosis not present

## 2023-06-22 DIAGNOSIS — F17210 Nicotine dependence, cigarettes, uncomplicated: Secondary | ICD-10-CM | POA: Diagnosis not present

## 2023-06-22 DIAGNOSIS — R0683 Snoring: Secondary | ICD-10-CM

## 2023-06-22 DIAGNOSIS — I428 Other cardiomyopathies: Secondary | ICD-10-CM | POA: Insufficient documentation

## 2023-06-22 DIAGNOSIS — Z720 Tobacco use: Secondary | ICD-10-CM | POA: Diagnosis not present

## 2023-06-22 DIAGNOSIS — Z8673 Personal history of transient ischemic attack (TIA), and cerebral infarction without residual deficits: Secondary | ICD-10-CM | POA: Diagnosis not present

## 2023-06-22 DIAGNOSIS — Z79899 Other long term (current) drug therapy: Secondary | ICD-10-CM | POA: Insufficient documentation

## 2023-06-22 DIAGNOSIS — I472 Ventricular tachycardia, unspecified: Secondary | ICD-10-CM

## 2023-06-22 DIAGNOSIS — Z95810 Presence of automatic (implantable) cardiac defibrillator: Secondary | ICD-10-CM | POA: Diagnosis not present

## 2023-06-22 LAB — BASIC METABOLIC PANEL WITH GFR
Anion gap: 12 (ref 5–15)
BUN: 10 mg/dL (ref 6–20)
CO2: 24 mmol/L (ref 22–32)
Calcium: 9.3 mg/dL (ref 8.9–10.3)
Chloride: 101 mmol/L (ref 98–111)
Creatinine, Ser: 1.02 mg/dL (ref 0.61–1.24)
GFR, Estimated: >60 mL/min (ref >60)
Glucose, Bld: 73 mg/dL (ref 70–99)
Potassium: 3.4 mmol/L — ABNORMAL LOW (ref 3.5–5.1)
Sodium: 137 mmol/L (ref 135–145)

## 2023-06-22 LAB — URIC ACID: Uric Acid, Serum: 8.9 mg/dL — ABNORMAL HIGH (ref 3.7–8.6)

## 2023-06-22 LAB — BRAIN NATRIURETIC PEPTIDE: B Natriuretic Peptide: 217.9 pg/mL — ABNORMAL HIGH (ref 0.0–100.0)

## 2023-06-22 LAB — DIGOXIN LEVEL: Digoxin Level: <0.4 ng/mL — ABNORMAL LOW (ref 0.8–2.0)

## 2023-06-22 MED ORDER — ALLOPURINOL 100 MG PO TABS: 100.0000 mg | ORAL_TABLET | Freq: Every day | ORAL | 2 refills | Status: DC

## 2023-06-22 MED ORDER — FUROSEMIDE 20 MG PO TABS: 80.0000 mg | ORAL_TABLET | Freq: Every day | ORAL | 11 refills | Status: DC

## 2023-06-22 MED ORDER — ENTRESTO 24-26 MG PO TABS: 1.0000 | ORAL_TABLET | Freq: Two times a day (BID) | ORAL | 11 refills | Status: DC

## 2023-06-22 MED ORDER — POTASSIUM CHLORIDE CRYS ER 20 MEQ PO TBCR: 20.0000 meq | EXTENDED_RELEASE_TABLET | Freq: Every day | ORAL | 3 refills | Status: DC

## 2023-06-22 NOTE — Telephone Encounter (Signed)
 Pt c/o medication issue:  1. Name of Medication: metoprolol  succinate (TOPROL  XL) 100 MG 24 hr tablet   2. How are you currently taking this medication (dosage and times per day)? N/A  3. Are you having a reaction (difficulty breathing--STAT)? No  4. What is your medication issue? The pharmacy requesting for this medication dosage to be changed to 200 MG. Please advise

## 2023-06-22 NOTE — Patient Instructions (Addendum)
 Thank you for coming in today  If you had labs drawn today, any labs that are abnormal the clinic will call you No news is good news   Medications: Start Allopurinol 100 mg daily call your primary care got acute gout episodes Decrease Entresto  24/26 mg 1 tablet twice daily Increase Lasix  to 80 mg 4 tablets daily  Check and log blood pressure  Follow up appointments: Your physician recommends that you return for lab work in: 10-14 days BMET  Your physician recommends that you schedule a follow-up appointment in:  3-4 months With Dr. Mitzie Anda Please call our office to schedule the follow-up appointment in July for September 2025.   Do the following things EVERYDAY: Weigh yourself in the morning before breakfast. Write it down and keep it in a log. Take your medicines as prescribed Eat low salt foods--Limit salt (sodium) to 2000 mg per day.  Stay as active as you can everyday Limit all fluids for the day to less than 2 liters Check and log blood pressure   At the [facility] Clinic, you and your health needs are our priority. As part of our continuing mission to provide you with exceptional heart care, we have created designated Provider Care Teams. These Care Teams include your primary Cardiologist (physician) and Advanced Practice Providers (APPs- Physician Assistants and Nurse Practitioners) who all work together to provide you with the care you need, when you need it.   You may see any of the following providers on your designated Care Team at your next follow up: Dr Jules Oar Dr Peder Bourdon Dr. Mimi Alt, NP Ruddy Corral, [place] [facility] [place], [place] Dennise Fitz, NP Luster Salters, PharmD   Please be sure to bring in all your medications bottles to every appointment.    Thank you for choosing [facility]  If you have any questions or concerns before your next appointment please send  us  a message through [place] or call our office at [phone].    TO LEAVE A MESSAGE FOR THE NURSE SELECT OPTION 2, PLEASE LEAVE A MESSAGE INCLUDING: YOUR NAME DATE OF BIRTH CALL BACK NUMBER REASON FOR CALL**this is important as we prioritize the call backs  YOU WILL RECEIVE A CALL BACK THE SAME DAY AS LONG AS YOU CALL BEFORE 4:00 PM

## 2023-06-22 NOTE — Telephone Encounter (Addendum)
 Pt aware, agreeable, and verbalized understanding  Rx sent  ----- Message from Elmarie Hacking sent at 06/22/2023  2:32 PM EDT ----- Labs stable. K a little low.   Start 20 KCL daily. He has repeat labs arranged.

## 2023-06-23 NOTE — Progress Notes (Unsigned)
  Electrophysiology Office Note:   ID:  DRAPER GALLON, DOB May 03, 1965, MRN [id number]  Primary Cardiologist: Maudine Sos, MD Electrophysiologist: Will Cortland Ding, MD  {Click to update primary MD,subspecialty MD or APP then REFRESH:1}    History of Present Illness:   SHAMEL GERMOND is a 58 y.o. male with h/o HTN, chronic CHF, SVT, OSA, tobacco abuse, prior h/o substance abuse, and s/p ICD seen today for routine electrophysiology followup.   Since last being seen in our clinic the patient reports doing ***.  he denies chest pain, palpitations, dyspnea, PND, orthopnea, nausea, vomiting, dizziness, syncope, edema, weight gain, or early satiety.   Review of systems complete and found to be negative unless listed in HPI.   EP Information / Studies Reviewed:    EKG is not ordered today. EKG from *** reviewed which showed ***       ICD Interrogation-  reviewed in detail today,  See PACEART report.  Arrhythmia/Device History Abbott single chamber ICD implanted 01/18/2018   Physical Exam:   VS:  There were no vitals taken for this visit.   Wt Readings from Last 3 Encounters:  06/22/23 142 lb 12.8 oz (64.8 kg)  05/11/23 139 lb (63 kg)  05/04/23 136 lb 6.4 oz (61.9 kg)     GEN: No acute distress *** NECK: No JVD; No carotid bruits CARDIAC: {EPRHYTHM:28826}, no murmurs, rubs, gallops RESPIRATORY:  Clear to auscultation without rales, wheezing or rhonchi  ABDOMEN: Soft, non-tender, non-distended EXTREMITIES:  {EDEMA LEVEL:28147::"No"} edema; No deformity   ASSESSMENT AND PLAN:    Chronic systolic CHF  s/p Abbott single chamber ICD  euvolemic today Stable on an appropriate medical regimen Normal ICD function See Pace Art report No changes today  Quinterrius R Confer's heart failure has failed to improve despite titration of guideline directed medication such that he qualifies for the Cardiac Contractility Modulation (CCM) device. The following information from the patient's  medical record supports the medical necessity of this procedure for my patient, consistent with the FDA on-label indication for CCM:  ? LVEF of {Blank single:19197::"35-40%","30-35%","25-30%","20-25%","<20%"}  confirmed by {Blank single:19197::"Echo","Cardiac MRI"} on ***   ?Symptomatic despite medication management of: {Blank multiple:19196::"diuretic","beta blocker","ACEi/ARB/ARNi","Aldosterone inhibitor","Hydralazine /Imdur","SGLT2 inhibitor"} as evidenced by symptoms below.   ? This patients signs and symptoms of heart failure include {Blank multiple:19196::""dyspnea with mild to moderate exertion","paroxysmal nocturnal dyspnea","orthopnea","edema","fatigue","weakness","dizziness","elevated jugular vein pulse (JVP)","3rd heart sound","pleural effusion","basilar crackles/rales/crepitation","ascites/abdominal distention","< 300 meter distance during a 6-minute walk test"}   ? NYHA Congestive Heart Failure Classification: {Blank single:19197::"I","II","III","IV"}  ? Recent hospitalization for Heart Failure on {Blank single:19197::"***","(not applicable for this patient)"}   This patient is NOT indicated for cardiac resynchronization therapy because QRS = *** by EKG on ***    Disposition:   Follow up with {EPPROVIDERS:28135::"EP Team"} {EPFOLLOW UP:28173}   Signed, Tylene Galla, PA-C

## 2023-06-24 ENCOUNTER — Encounter: Payer: Self-pay | Admitting: Student

## 2023-06-24 ENCOUNTER — Ambulatory Visit: Attending: Student | Admitting: Student

## 2023-06-24 VITALS — BP 128/90 | HR 94 | Ht 68.0 in | Wt 146.7 lb

## 2023-06-24 DIAGNOSIS — I5022 Chronic systolic (congestive) heart failure: Secondary | ICD-10-CM | POA: Insufficient documentation

## 2023-06-24 NOTE — Patient Instructions (Signed)
 Medication Instructions:  No medication changes today. *If you need a refill on your cardiac medications before your next appointment, please call your pharmacy*  Lab Work: No labwork ordered today. If you have labs (blood work) drawn today and your tests are completely normal, you will receive your results only by: MyChart Message (if you have MyChart) OR A paper copy in the mail If you have any lab test that is abnormal or we need to change your treatment, we will call you to review the results.  Testing/Procedures: No testing ordered today  Follow-Up: At [facility], you and your health needs are our priority.  As part of our continuing mission to provide you with exceptional heart care, our providers are all part of one team.  This team includes your primary Cardiologist (physician) and Advanced Practice Providers or APPs (Physician Assistants and Nurse Practitioners) who all work together to provide you with the care you need, when you need it.  Your next appointment:   3 month(s)  Provider:   You may see Will Cortland Ding, MD or one of the following Advanced Practice Providers on your designated Care Team:   Mertha Abrahams, [place] [address] [place], PA-C Suzann Riddle, NP Creighton Doffing, NP    We recommend signing up for the patient portal called "MyChart".  Sign up information is provided on this After Visit Summary.  MyChart is used to connect with patients for Virtual Visits (Telemedicine).  Patients are able to view lab/test results, encounter notes, upcoming appointments, etc.  Non-urgent messages can be sent to your provider as well.   To learn more about what you can do with MyChart, go to [email or web address].

## 2023-06-24 NOTE — Telephone Encounter (Signed)
 Spoke w/[facility], they state they have resolved issue and nothing further is needed at this time.

## 2023-06-26 NOTE — Progress Notes (Signed)
 Remote ICD transmission.

## 2023-07-02 ENCOUNTER — Other Ambulatory Visit (HOSPITAL_COMMUNITY)

## 2023-07-27 ENCOUNTER — Encounter: Payer: Self-pay | Admitting: Cardiology

## 2023-07-29 ENCOUNTER — Other Ambulatory Visit: Payer: Self-pay | Admitting: Family Medicine

## 2023-07-29 ENCOUNTER — Telehealth (HOSPITAL_COMMUNITY): Payer: Self-pay

## 2023-07-29 DIAGNOSIS — J438 Other emphysema: Secondary | ICD-10-CM

## 2023-07-29 NOTE — Telephone Encounter (Signed)
 Copied from CRM [id number]. Topic: Clinical - Medication Refill >> Jul 29, 2023 11:59 AM Edsel HERO wrote: Medication:  ADVAIR  DISKUS 250-50 MCG/ACT AEPB albuterol  (PROAIR  HFA) 108 (90 Base) MCG/ACT inhaler  Has the patient contacted their pharmacy? No  This is the patient's preferred pharmacy:  [facility] - [place], [place] - [address] [place] [address] Phone: [phone] Fax: [phone]  Is this the correct pharmacy for this prescription? Yes  Has the prescription been filled recently? Yes  Is the patient out of the medication? Yes  Has the patient been seen for an appointment in the last year OR does the patient have an upcoming appointment? Yes  Can we respond through MyChart? Yes

## 2023-07-29 NOTE — Telephone Encounter (Signed)
 There is no precertification required for a in lab sleep study per patients insurance.

## 2023-07-31 ENCOUNTER — Other Ambulatory Visit (HOSPITAL_COMMUNITY): Payer: Self-pay | Admitting: Cardiology

## 2023-07-31 NOTE — Telephone Encounter (Signed)
 Called pharmacy and pt has refills on file. Steffan (pharmacist) stated that pt can pick today or will be delivered Monday. Called pt and LM on VM.

## 2023-08-03 ENCOUNTER — Ambulatory Visit: Admitting: Gastroenterology

## 2023-08-03 ENCOUNTER — Encounter: Payer: Self-pay | Admitting: Gastroenterology

## 2023-08-03 VITALS — BP 122/70 | HR 76 | Ht 68.0 in | Wt 137.0 lb

## 2023-08-03 DIAGNOSIS — R110 Nausea: Secondary | ICD-10-CM

## 2023-08-03 DIAGNOSIS — Z1211 Encounter for screening for malignant neoplasm of colon: Secondary | ICD-10-CM

## 2023-08-03 DIAGNOSIS — R630 Anorexia: Secondary | ICD-10-CM | POA: Diagnosis not present

## 2023-08-03 NOTE — Patient Instructions (Signed)
 Your provider has ordered Diatherix stool testing for you. You have received a kit from our office today containing all necessary supplies to complete this test. Please carefully read the stool collection instructions provided in the kit before opening the accompanying materials. In addition, be sure there is a label providing your full name and date of birth on the puritan opti-swab tube that is supplied in the kit (if you do not see a label with this information on your test tube, please make us  aware before test collection!). After completing the test, you should secure the purtian tube into the specimen biohazard bag. The [facility] E-Req sheet (including date and time of specimen collection) should be placed into the outside pocket of the specimen biohazard bag and returned to the [facility] lab (basement floor of [facility]) within 3 days of collection. Please make sure to give the specimen to a staff member at the lab. DO NOT leave the specimen on the counter.   If the specimen date and time (can be found in the upper right boxed portion of the sheet) are not filled out on the E-Req sheet, the test will NOT be performed.   _______________________________________________________  If your blood pressure at your visit was 140/90 or greater, please contact your primary care physician to follow up on this.  _______________________________________________________  If you are age 65 or older, your body mass index should be between 23-30. Your Body mass index is 20.83 kg/m. If this is out of the aforementioned range listed, please consider follow up with your Primary Care Provider.  If you are age 38 or younger, your body mass index should be between 19-25. Your Body mass index is 20.83 kg/m. If this is out of the aformentioned range listed, please consider follow up with your Primary Care Provider.   ________________________________________________________  The  [facility] providers would like to encourage you to use MYCHART to communicate with providers for non-urgent requests or questions.  Due to long hold times on the telephone, sending your provider a message by [facility] may be a faster and more efficient way to get a response.  Please allow 48 business hours for a response.  Please remember that this is for non-urgent requests.  _______________________________________________________  Thank you for trusting me with your gastrointestinal care. Deanna May, RNP
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
## 2023-08-04 NOTE — Progress Notes (Signed)
 I agree with the assessment and plan as outlined by Ms. May. Would recommend a Cologuard for colon cancer screening at this time since he does not have any history that would warrant high risk screening at this time. With his significant alcohol use history, I would however check his LFTs, update his blood counts (he was previously anemic in 2023 and may need an anemic work up), and check a PT/INR to evaluate for his risk of underlying liver disease. Last U/S in 2023 showed some evidence of fatty liver.

## 2023-08-05 ENCOUNTER — Other Ambulatory Visit: Payer: Self-pay | Admitting: *Deleted

## 2023-08-05 DIAGNOSIS — R110 Nausea: Secondary | ICD-10-CM

## 2023-08-05 DIAGNOSIS — D649 Anemia, unspecified: Secondary | ICD-10-CM

## 2023-08-05 DIAGNOSIS — K760 Fatty (change of) liver, not elsewhere classified: Secondary | ICD-10-CM

## 2023-08-05 DIAGNOSIS — Z1211 Encounter for screening for malignant neoplasm of colon: Secondary | ICD-10-CM

## 2023-08-05 NOTE — Progress Notes (Signed)
 Patient informed will do cologuard. He will get blood test when he drops off H. Pylori stool study

## 2023-08-10 ENCOUNTER — Ambulatory Visit: Payer: Medicaid Other

## 2023-08-10 DIAGNOSIS — I472 Ventricular tachycardia, unspecified: Secondary | ICD-10-CM

## 2023-08-11 ENCOUNTER — Other Ambulatory Visit (INDEPENDENT_AMBULATORY_CARE_PROVIDER_SITE_OTHER)

## 2023-08-11 ENCOUNTER — Ambulatory Visit: Attending: Cardiology

## 2023-08-11 DIAGNOSIS — Z95810 Presence of automatic (implantable) cardiac defibrillator: Secondary | ICD-10-CM | POA: Diagnosis not present

## 2023-08-11 DIAGNOSIS — I5022 Chronic systolic (congestive) heart failure: Secondary | ICD-10-CM | POA: Diagnosis not present

## 2023-08-11 DIAGNOSIS — R110 Nausea: Secondary | ICD-10-CM | POA: Diagnosis not present

## 2023-08-11 DIAGNOSIS — K760 Fatty (change of) liver, not elsewhere classified: Secondary | ICD-10-CM

## 2023-08-11 DIAGNOSIS — D649 Anemia, unspecified: Secondary | ICD-10-CM

## 2023-08-11 DIAGNOSIS — R630 Anorexia: Secondary | ICD-10-CM | POA: Diagnosis not present

## 2023-08-11 DIAGNOSIS — Z1211 Encounter for screening for malignant neoplasm of colon: Secondary | ICD-10-CM | POA: Diagnosis not present

## 2023-08-11 LAB — CUP PACEART REMOTE DEVICE CHECK
Battery Remaining Longevity: 53 mo
Battery Remaining Percentage: 52 %
Battery Voltage: 2.95 V
Brady Statistic RV Percent Paced: 1 %
Date Time Interrogation Session: 20250721034425
HighPow Impedance: 73 Ohm
HighPow Impedance: 73 Ohm
Implantable Lead Connection Status: 753985
Implantable Lead Implant Date: 20191230
Implantable Lead Location: 753860
Implantable Lead Model: 7122
Implantable Pulse Generator Implant Date: 20191230
Lead Channel Impedance Value: 530 Ohm
Lead Channel Pacing Threshold Amplitude: 0.5 V
Lead Channel Pacing Threshold Pulse Width: 0.5 ms
Lead Channel Sensing Intrinsic Amplitude: 11.6 mV
Lead Channel Setting Pacing Amplitude: 2.5 V
Lead Channel Setting Pacing Pulse Width: 0.5 ms
Lead Channel Setting Sensing Sensitivity: 0.5 mV
Pulse Gen Serial Number: 9850980
Zone Setting Status: 755011

## 2023-08-11 LAB — CBC WITH DIFFERENTIAL/PLATELET
Basophils Absolute: 0.1 K/uL (ref 0.0–0.1)
Basophils Relative: 1.1 % (ref 0.0–3.0)
Eosinophils Absolute: 0 K/uL (ref 0.0–0.7)
Eosinophils Relative: 0.9 % (ref 0.0–5.0)
HCT: 34.5 % — ABNORMAL LOW (ref 39.0–52.0)
Hemoglobin: 11.9 g/dL — ABNORMAL LOW (ref 13.0–17.0)
Lymphocytes Relative: 14.9 % (ref 12.0–46.0)
Lymphs Abs: 0.8 K/uL (ref 0.7–4.0)
MCHC: 34.5 g/dL (ref 30.0–36.0)
MCV: 104 fl — ABNORMAL HIGH (ref 78.0–100.0)
Monocytes Absolute: 0.6 K/uL (ref 0.1–1.0)
Monocytes Relative: 10.9 % (ref 3.0–12.0)
Neutro Abs: 4 K/uL (ref 1.4–7.7)
Neutrophils Relative %: 72.2 % (ref 43.0–77.0)
Platelets: 220 K/uL (ref 150.0–400.0)
RBC: 3.31 Mil/uL — ABNORMAL LOW (ref 4.22–5.81)
RDW: 15.5 % (ref 11.5–15.5)
WBC: 5.6 K/uL (ref 4.0–10.5)

## 2023-08-11 LAB — COMPREHENSIVE METABOLIC PANEL WITH GFR
ALT: 24 U/L (ref 0–53)
AST: 35 U/L (ref 0–37)
Albumin: 4.7 g/dL (ref 3.5–5.2)
Alkaline Phosphatase: 84 U/L (ref 39–117)
BUN: 22 mg/dL (ref 6–23)
CO2: 24 meq/L (ref 19–32)
Calcium: 10.1 mg/dL (ref 8.4–10.5)
Chloride: 98 meq/L (ref 96–112)
Creatinine, Ser: 1.27 mg/dL (ref 0.40–1.50)
GFR: 62.5 mL/min (ref >60.00)
Glucose, Bld: 75 mg/dL (ref 70–99)
Potassium: 4.1 meq/L (ref 3.5–5.1)
Sodium: 134 meq/L — ABNORMAL LOW (ref 135–145)
Total Bilirubin: 0.4 mg/dL (ref 0.2–1.2)
Total Protein: 7.5 g/dL (ref 6.0–8.3)

## 2023-08-11 LAB — PROTIME-INR
INR: 0.9 ratio (ref 0.8–1.0)
Prothrombin Time: 10 s (ref 9.6–13.1)

## 2023-08-12 ENCOUNTER — Ambulatory Visit: Payer: Self-pay | Admitting: Gastroenterology

## 2023-08-12 DIAGNOSIS — Z1211 Encounter for screening for malignant neoplasm of colon: Secondary | ICD-10-CM

## 2023-08-13 NOTE — Progress Notes (Signed)
 EPIC Encounter for ICM Monitoring  Patient Name: [name] is a 58 y.o. male Date: 08/13/2023 Primary Care Physican: Delbert Clam, MD Primary Cardiologist: Rolan Electrophysiologist: Inocencio 02/17/2022 Weight: 142 lbs 08/22/2022 Office Weight: 138 lbs  10/15/2022 Weight:  139 lbs 12/23/2022 Office Weight: 141 lbs 04/02/2023 Office Weight:  140 lbs 05/04/2023 Office Weight: 136 lbs   05/11/2023 Office Weight: 139 lbs     06/24/2023 Office Weight: 146 lbs          SVT Episodes 8 NVST Episodes 6                                          Transmission results reviewed.      Diet:  Does not follow low salt diet.   CorVue thoracic impedance suggesting suggesting possible fluid accumulation from 6/29-7/12.   Prescribed:  Furosemide  20 mg take 4 tablets (80 mg total) by mouth daily.   Potassium 20 mEq take 1 tablet by mouth daily. Spironolactone  25 mg take 1 tablet by mouth at bedtime  Pt being followed by [name], EMT paramedicine program.     Labs: 08/11/2023 Creatinine 1.27, BUN 22, Potassium 4.1, Sodium 134  06/22/2023 Creatinine 1.02, BUN 10, Potassium 3.4, Sodium 137, GFR >60  05/11/2023 Creatinine 1.19, BUN 15, Potassium 4.0, Sodium 136, GFR >60  A complete set of results can be found in Results Review.   Recommendations: No changes.    Follow-up plan: ICM clinic phone appointment on 09/14/2023.    91 day device clinic remote transmission 11/09/2023.     EP/Cardiology Office Visits:  Recall 10/20/2023 with Dr Rolan.   09/24/2023 with [name], PA (6 month f/u).     Copy of ICM check sent to Dr. Inocencio.     3 month ICM trend: 08/11/2023.    12-14 Month ICM trend:     [name], RN 08/13/2023 7:19 AM

## 2023-08-14 ENCOUNTER — Other Ambulatory Visit: Payer: Self-pay

## 2023-08-14 DIAGNOSIS — D649 Anemia, unspecified: Secondary | ICD-10-CM

## 2023-08-18 LAB — COLOGUARD

## 2023-08-19 ENCOUNTER — Ambulatory Visit: Payer: Self-pay | Admitting: Cardiology

## 2023-08-19 NOTE — Addendum Note (Signed)
 Addended by: Kodi Steil N on: 08/19/2023 04:27 PM   Modules accepted: Orders

## 2023-08-31 ENCOUNTER — Encounter: Payer: Self-pay | Admitting: Gastroenterology

## 2023-09-14 ENCOUNTER — Ambulatory Visit: Attending: Cardiology

## 2023-09-14 DIAGNOSIS — Z95810 Presence of automatic (implantable) cardiac defibrillator: Secondary | ICD-10-CM | POA: Diagnosis not present

## 2023-09-14 DIAGNOSIS — I5022 Chronic systolic (congestive) heart failure: Secondary | ICD-10-CM | POA: Diagnosis not present

## 2023-09-17 ENCOUNTER — Telehealth: Payer: Self-pay

## 2023-09-17 NOTE — Progress Notes (Signed)
 Claudene Delon LABOR, RN  Jami Bogdanski, Mitzie RAMAN, RN; P Cv Div Heartcare Device Walterine Mitzie, This is not new for this Pt.  All of the episodes are SVT.  You can see how they are grouped together on certain days.   They last longer, and the strips that you can see are very narrow.  Meaning the beats are coming from the top of the heart, and thankfully not the ventricle. It looks like Jodie saw him recently, and said medication wise he was as optimized as he could get.  They are actually going to see if he qualifies for CCM therapy.  He should be seeing Jodie coming up within the next month.  I just checked, he sees him 9/4. :D  Good eyes! [name]

## 2023-09-17 NOTE — Telephone Encounter (Signed)
 Remote ICM transmission received.  Attempted call to patient regarding ICM remote transmission and no answer.

## 2023-09-17 NOTE — Progress Notes (Signed)
 EPIC Encounter for ICM Monitoring  Patient Name: [name] is a 58 y.o. male Date: 09/17/2023 Primary Care Physican: Cole Clam, MD Primary Cardiologist: [name] Electrophysiologist: [name] 02/17/2022 Weight: 142 lbs 08/22/2022 Office Weight: 138 lbs  10/15/2022 Weight:  139 lbs 12/23/2022 Office Weight: 141 lbs 04/02/2023 Office Weight:  140 lbs 05/04/2023 Office Weight: 136 lbs   05/11/2023 Office Weight: 139 lbs     06/24/2023 Office Weight: 146 lbs           SVT Episodes 60 NVST Episodes 30                                          Attempted call to patient and unable to reach.   Transmission results reviewed.     Diet:  Does not follow low salt diet.   CorVue thoracic impedance suggesting normal fluid levels with the exception of possible fluid accumulation from 7/26-7/30 & 8/9-8/15.  Message sent to device clinic triage 8/28 requesting review of SVT & NSVT episodes   Prescribed:  Furosemide  20 mg take 4 tablets (80 mg total) by mouth daily.   Potassium 20 mEq take 1 tablet by mouth daily. Spironolactone  25 mg take 1 tablet by mouth at bedtime  Pt being followed by [name], EMT paramedicine program.     Labs: 08/11/2023 Creatinine 1.27, BUN 22, Potassium 4.1, Sodium 134  06/22/2023 Creatinine 1.02, BUN 10, Potassium 3.4, Sodium 137, GFR >60  05/11/2023 Creatinine 1.19, BUN 15, Potassium 4.0, Sodium 136, GFR >60  A complete set of results can be found in Results Review.   Recommendations:  Unable to reach.     Follow-up plan: ICM clinic phone appointment on 10/19/2023.    91 day device clinic remote transmission 11/09/2023.     EP/Cardiology Office Visits:  Recall 10/20/2023 with Dr [name].   09/24/2023 with Cole Tillery, PA (6 month f/u).     Copy of ICM check sent to Dr. Inocencio.      3 month ICM trend: 09/14/2023.    12-14 Month ICM trend:     Cole GORMAN Garner, RN 09/17/2023 7:12 AM

## 2023-09-24 ENCOUNTER — Ambulatory Visit: Admitting: Student

## 2023-09-28 ENCOUNTER — Ambulatory Visit: Admitting: Family Medicine

## 2023-09-30 ENCOUNTER — Other Ambulatory Visit (HOSPITAL_COMMUNITY): Payer: Self-pay | Admitting: Family Medicine

## 2023-09-30 ENCOUNTER — Other Ambulatory Visit (HOSPITAL_COMMUNITY): Payer: Self-pay | Admitting: Cardiology

## 2023-10-02 ENCOUNTER — Ambulatory Visit: Attending: Student | Admitting: Student

## 2023-10-02 NOTE — Progress Notes (Deleted)
  Electrophysiology Office Note:   ID:  [name], DOB July 15, 1965, MRN [id number]  Primary Cardiologist: Annabella Scarce, MD Electrophysiologist: Will Gladis Norton, MD  {Click to update primary MD,subspecialty MD or APP then REFRESH:1}    History of Present Illness:   [name] is a 58 y.o. male with h/o HTN, chronic CHF, SVT, OSA, tobacco abuse, prior h/o substance abuse, and s/p ICD seen today for routine electrophysiology followup.   Since last being seen in our clinic the patient reports doing ***.  he denies chest pain, palpitations, dyspnea, PND, orthopnea, nausea, vomiting, dizziness, syncope, edema, weight gain, or early satiety.   Review of systems complete and found to be negative unless listed in HPI.   EP Information / Studies Reviewed:    EKG is ordered today. Personal review as below.       ICD Interrogation-  reviewed in detail today,  See PACEART report.  Arrhythmia/Device History Abbott single chamber ICD implanted 01/18/2018   Physical Exam:   VS:  There were no vitals taken for this visit.   Wt Readings from Last 3 Encounters:  08/03/23 137 lb (62.1 kg)  06/24/23 146 lb 11.2 oz (66.5 kg)  06/22/23 142 lb 12.8 oz (64.8 kg)     GEN: No acute distress *** NECK: No JVD; No carotid bruits CARDIAC: {EPRHYTHM:28826}, no murmurs, rubs, gallops RESPIRATORY:  Clear to auscultation without rales, wheezing or rhonchi  ABDOMEN: Soft, non-tender, non-distended EXTREMITIES:  {EDEMA LEVEL:28147::No} edema; No deformity   ASSESSMENT AND PLAN:    Chronic systolic CHF  s/p Abbott single chamber ICD  euvolemic today Stable on an appropriate medical regimen Normal ICD function See Pace Art report No changes today  *** CCM  HTN Stable on current regimen   OSA  Encouraged nightly CPAP   H/o SVT VT Quiescent on device Continue toprol  Disposition:   Follow up with {EPPROVIDERS:28135::EP Team} {EPFOLLOW UP:28173}   Signed, Ozell Prentice Passey, PA-C

## 2023-10-05 ENCOUNTER — Encounter: Payer: Self-pay | Admitting: Student

## 2023-10-19 ENCOUNTER — Ambulatory Visit: Attending: Cardiology

## 2023-10-19 DIAGNOSIS — Z95810 Presence of automatic (implantable) cardiac defibrillator: Secondary | ICD-10-CM | POA: Diagnosis not present

## 2023-10-19 DIAGNOSIS — I5022 Chronic systolic (congestive) heart failure: Secondary | ICD-10-CM | POA: Diagnosis not present

## 2023-10-20 ENCOUNTER — Telehealth: Payer: Self-pay

## 2023-10-20 NOTE — Telephone Encounter (Signed)
 Remote ICM transmission received.  Attempted call to patient regarding ICM remote transmission and no answer.

## 2023-10-20 NOTE — Progress Notes (Signed)
 EPIC Encounter for ICM Monitoring  Patient Name: [name] is a 58 y.o. male Date: 10/20/2023 Primary Care Physican: Delbert Clam, MD Primary Cardiologist: Rolan Electrophysiologist: Inocencio 02/17/2022 Weight: 142 lbs 08/22/2022 Office Weight: 138 lbs  10/15/2022 Weight:  139 lbs 12/23/2022 Office Weight: 141 lbs 04/02/2023 Office Weight:  140 lbs 05/04/2023 Office Weight: 136 lbs   05/11/2023 Office Weight: 139 lbs     06/24/2023 Office Weight: 146 lbs           SVT Episodes 60 NVST Episodes 30                                          Attempted call to patient and unable to reach.   Transmission results reviewed.     Diet:  Does not follow low salt diet.   Since 09/14/2023 ICM Remote Transmission: CorVue thoracic impedance suggesting normal fluid levels with the exception of possible fluid accumulation starting 10/14/2023 and returned to baseline 10/19/2023.  Also suggesting possible fluid accumulation from 09/25/2023 - 09/30/2023.   Pt has history of SVT & NSVT episodes.     Prescribed:  Furosemide  20 mg take 4 tablets (80 mg total) by mouth daily.   Potassium 20 mEq take 1 tablet by mouth daily. Spironolactone  25 mg take 1 tablet by mouth at bedtime  Pt being followed by [name], EMT paramedicine program.     Labs: 08/11/2023 Creatinine 1.27, BUN 22, Potassium 4.1, Sodium 134  06/22/2023 Creatinine 1.02, BUN 10, Potassium 3.4, Sodium 137, GFR >60  05/11/2023 Creatinine 1.19, BUN 15, Potassium 4.0, Sodium 136, GFR >60  A complete set of results can be found in Results Review.   Recommendations:  Unable to reach.     Follow-up plan: ICM clinic phone appointment on 11/23/2023.    91 day device clinic remote transmission 11/09/2023.     EP/Cardiology Office Visits:  10/26/2023 with Dr Rolan.   Missed 10/02/2023 OV with Jodie Passey, PA.     Copy of ICM check sent to Dr. Inocencio.      Remote monitoring is medically necessary for Heart Failure Management.    90 day Daily  Thoracic Impedance ICM trend: 07/20/2023 through 10/19/2023.    12-14 Month Thoracic Impedance ICM trend:     [name], RN 10/20/2023 10:25 AM

## 2023-10-23 ENCOUNTER — Telehealth (HOSPITAL_COMMUNITY): Payer: Self-pay | Admitting: Cardiology

## 2023-10-23 NOTE — Telephone Encounter (Signed)
 Called to confirm/remind patient of their appointment [facility] on 10/23/2023.   Appointment:   [x] Confirmed  [] Left mess   [] No answer/No voice mail  [] VM Full/unable to leave message  [] Phone not in service  Patient reminded to bring all medications and/or complete list.  Confirmed patient has transportation. Gave directions, instructed to utilize valet parking.

## 2023-10-26 ENCOUNTER — Encounter (HOSPITAL_COMMUNITY): Payer: Self-pay | Admitting: Cardiology

## 2023-10-26 ENCOUNTER — Ambulatory Visit (HOSPITAL_COMMUNITY)
Admission: RE | Admit: 2023-10-26 | Discharge: 2023-10-26 | Disposition: A | Discharge status: Home / Self Care | Source: Ambulatory Visit | Attending: Cardiology | Admitting: Cardiology

## 2023-10-26 ENCOUNTER — Ambulatory Visit (HOSPITAL_COMMUNITY): Payer: Self-pay | Admitting: Cardiology

## 2023-10-26 VITALS — BP 130/80 | HR 71 | Wt 141.0 lb

## 2023-10-26 DIAGNOSIS — F1411 Cocaine abuse, in remission: Secondary | ICD-10-CM | POA: Insufficient documentation

## 2023-10-26 DIAGNOSIS — Z8673 Personal history of transient ischemic attack (TIA), and cerebral infarction without residual deficits: Secondary | ICD-10-CM | POA: Diagnosis not present

## 2023-10-26 DIAGNOSIS — J439 Emphysema, unspecified: Secondary | ICD-10-CM | POA: Diagnosis not present

## 2023-10-26 DIAGNOSIS — F17210 Nicotine dependence, cigarettes, uncomplicated: Secondary | ICD-10-CM | POA: Insufficient documentation

## 2023-10-26 DIAGNOSIS — Z79899 Other long term (current) drug therapy: Secondary | ICD-10-CM | POA: Insufficient documentation

## 2023-10-26 DIAGNOSIS — I428 Other cardiomyopathies: Secondary | ICD-10-CM | POA: Insufficient documentation

## 2023-10-26 DIAGNOSIS — I110 Hypertensive heart disease with heart failure: Secondary | ICD-10-CM | POA: Diagnosis not present

## 2023-10-26 DIAGNOSIS — M109 Gout, unspecified: Secondary | ICD-10-CM | POA: Diagnosis not present

## 2023-10-26 DIAGNOSIS — Z8249 Family history of ischemic heart disease and other diseases of the circulatory system: Secondary | ICD-10-CM | POA: Insufficient documentation

## 2023-10-26 DIAGNOSIS — I2510 Atherosclerotic heart disease of native coronary artery without angina pectoris: Secondary | ICD-10-CM | POA: Diagnosis not present

## 2023-10-26 DIAGNOSIS — I5042 Chronic combined systolic (congestive) and diastolic (congestive) heart failure: Secondary | ICD-10-CM

## 2023-10-26 DIAGNOSIS — I5022 Chronic systolic (congestive) heart failure: Secondary | ICD-10-CM | POA: Insufficient documentation

## 2023-10-26 DIAGNOSIS — Z95810 Presence of automatic (implantable) cardiac defibrillator: Secondary | ICD-10-CM | POA: Diagnosis not present

## 2023-10-26 DIAGNOSIS — Z7984 Long term (current) use of oral hypoglycemic drugs: Secondary | ICD-10-CM | POA: Insufficient documentation

## 2023-10-26 LAB — BASIC METABOLIC PANEL WITH GFR
Anion gap: 13 (ref 5–15)
BUN: 21 mg/dL — ABNORMAL HIGH (ref 6–20)
CO2: 23 mmol/L (ref 22–32)
Calcium: 10 mg/dL (ref 8.9–10.3)
Chloride: 97 mmol/L — ABNORMAL LOW (ref 98–111)
Creatinine, Ser: 1.27 mg/dL — ABNORMAL HIGH (ref 0.61–1.24)
GFR, Estimated: >60 mL/min (ref >60)
Glucose, Bld: 77 mg/dL (ref 70–99)
Potassium: 4 mmol/L (ref 3.5–5.1)
Sodium: 133 mmol/L — ABNORMAL LOW (ref 135–145)

## 2023-10-26 LAB — BRAIN NATRIURETIC PEPTIDE: B Natriuretic Peptide: 86.3 pg/mL (ref 0.0–100.0)

## 2023-10-26 MED ORDER — ENTRESTO 49-51 MG PO TABS: 1.0000 | ORAL_TABLET | Freq: Two times a day (BID) | ORAL | 3 refills | Status: DC

## 2023-10-26 MED ORDER — FUROSEMIDE 20 MG PO TABS: 60.0000 mg | ORAL_TABLET | Freq: Every day | ORAL | Status: DC

## 2023-10-26 NOTE — Progress Notes (Signed)
 Advanced [facility] Failure Clinic Note    PCP: Delbert Clam, MD HF Cardiology: Dr. Rolan  Chief complaint: CHF  58 y.o. with history of nonischemic cardiomyopathy was referred by Rosaline Bane for evaluation of CHF.  Cardiomyopathy has been diagnosed since 2016 when echo showed EF 20%.  He has a history of cocaine use and ETOH abuse. Currently, drinking about 3 beers/day.  He is smoking.  No recent cocaine.  He had a stroke in 2017 in setting of uncontrolled HTN and cocaine abuse.  He has a Secondary school teacher ICD.  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  Last echo in 2/23 showed EF < 20%, severe LV dilation, moderately decreased RV systolic function with mild RV enlargement. He has 13 siblings, none of whom have known cardiac disease.  His grandmother had CHF and there apparently was conversation about a heart transplant but she passed away.  CPX 11-09-23) showed severe functional limitation due to HF and deconditioning.   Cardiac MRI 10/23 (difficult images due to ICD artifact) LVEF 24%, RVEF 34%.  Echo 9/24 showed EF 25%, RV mildly reduced, prominent LV apical trabeculation  Today he returns for HF follow up. He can walk about 200 yards before tiring/getting short of breath.  Occasional lightheadedness if he stands too fast, not severe.  No orthopnea/PND.  No chest pain.  Weight down 1 lb. He is still smoking 10 cigarettes/day.  He uses inhalers for COPD.   St Jude device interrogation (personally reviewed):  Stable thoracic impedance, he has had episodes of NSVT but no treated sustained VT.   ECG (personally reviewed): NSR, LAFB, anterolateral TWIs.   Labs (3/24): K 3.6, creatinine 0.96 Labs (4/24): BNP 488, K 3.6, creatinine 1.11 Labs (8/24): K 3.7, creatinine 1.28 Labs (12/24): K 4.4, creatinine 1.15 Labs (4/25): K 4.0, creatinine 1.19, LDL 33 Labs (7/25): K 4.1, creatinine 1.29, hgb 11.9  PMH: 1. H/o seizure disorder: ?ETOH. 2. HTN 3. CVA: 2017 with residual left-sided  weakness.  4. H/o SVT 5. OSA 6. COPD: Active smoker - CT chest in 3/25 with mild emphysema 7. VT: St Jude ICD.  ATP 6/23.  8. Chronic systolic CHF: Nonischemic cardiomyopathy.  Documented since at least 2016. St Jude ICD.  - Echo (3/16): EF 20% - Echo (2/23): EF < 20%, severe LV dilation, moderately decreased RV systolic function with mild RV enlargement.  - Coronary CTA (6/23): 81st percentile calcium  score, mild nonobstructive CAD.  - CPX 11/09/23): Peak VO2: 14.3 (43% predicted peak VO2), VE/VCO2 slope: 39, OUES: 1.10, Peak RER: 0.70  - Cardiac MRI 10/23 (difficult images due to ICD artifact) LVEF 24%, RVEF 34%. - Echo (9/24): EF 255, RV mildly reduced, prominent LV apical trabeculation 9. Prior h/o cocaine 10. Gout  Social History   Socioeconomic History   Marital status: Single    Spouse name: Not on file   Number of children: 1   Years of education: 12   Highest education level: Not on file  Occupational History   Not on file  Tobacco Use   Smoking status: Every Day    Current packs/day: 0.50    Average packs/day: 0.5 packs/day for 20.0 years (10.0 ttl pk-yrs)    Types: Cigarettes   Smokeless tobacco: Never   Tobacco comments:    02/25/22 down to 1/2 pack a day  Vaping Use   Vaping status: Never Used  Substance and Sexual Activity   Alcohol use: Yes    Alcohol/week: 1.0 standard drink of alcohol  Types: 1 Cans of beer per week    Comment: socially    Drug use: Not Currently    Types: Cocaine    Comment: stopped using cocaine 11/19/14   Sexual activity: Not on file  Other Topics Concern   Not on file  Social History Narrative   Lives alone, nurse comes 2 hrs daily   Social Drivers of Health   Financial Resource Strain: Low Risk  (03/25/2023)   Overall Financial Resource Strain (CARDIA)    Difficulty of Paying Living Expenses: Not very hard  Food Insecurity: No Food Insecurity (03/25/2023)   Hunger Vital Sign    Worried About Running Out of Food in the Last Year:  Never true    Ran Out of Food in the Last Year: Never true  Transportation Needs: No Transportation Needs (03/25/2023)   PRAPARE - Administrator, Civil Service (Medical): No    Lack of Transportation (Non-Medical): No  Physical Activity: Inactive (03/25/2023)   Exercise Vital Sign    Days of Exercise per Week: 0 days    Minutes of Exercise per Session: 0 min  Stress: No Stress Concern Present (03/25/2023)   [employer] of Occupational Health - Occupational Stress Questionnaire    Feeling of Stress : Not at all  Social Connections: Moderately Integrated (03/25/2023)   Social Connection and Isolation Panel    Frequency of Communication with Friends and Family: More than three times a week    Frequency of Social Gatherings with Friends and Family: More than three times a week    Attends Religious Services: More than 4 times per year    Active Member of [employer] or Organizations: Yes    Attends Banker Meetings: Never    Marital Status: Never married  Intimate Partner Violence: Not At Risk (03/25/2023)   Humiliation, Afraid, Rape, and Kick questionnaire    Fear of Current or Ex-Partner: No    Emotionally Abused: No    Physically Abused: No    Sexually Abused: No   Family History  Problem Relation Age of Onset   Hypertension Mother    Hyperlipidemia Mother    Hypertension Father    Hypertension Sister    Hypertension Brother    Stroke Maternal Aunt    Colon cancer Neg Hx    Esophageal cancer Neg Hx    ROS: All systems reviewed and negative except as per HPI.   Current Outpatient Medications  Medication Sig Dispense Refill   ADVAIR  DISKUS 250-50 MCG/ACT AEPB Inhale 1 puff into the lungs 2 (two) times daily. in the morning and at bedtime. 180 each 1   albuterol  (PROAIR  HFA) 108 (90 Base) MCG/ACT inhaler Inhale 2 puffs into the lungs every 6 (six) hours as needed for shortness of breath. 18 g 6   allopurinol  (ZYLOPRIM ) 100 MG tablet TAKE 1 TABLET (100 MG TOTAL)  BY MOUTH DAILY (AM) 30 tablet 2   aspirin  81 MG EC tablet Take 81 mg by mouth in the morning. Swallow whole.     atorvastatin  (LIPITOR) 40 MG tablet Take 1 tablet (40 mg total) by mouth daily. 90 tablet 1   Blood Pressure Monitoring (BLOOD PRESSURE CUFF) MISC Take daily blood pressures 1 each 0   buPROPion  (WELLBUTRIN  SR) 150 MG 12 hr tablet TAKE 1 TABLET BY MOUTH 2 (TWO) TIMES DAILY. (AM +PM) 180 tablet 1   colchicine  0.6 MG tablet Take 1 tablet (0.6 mg total) by mouth daily as needed (gout). FOLLOW UP WITH  PCP FOR REFILLS 15 tablet 1   dapagliflozin  propanediol (FARXIGA ) 10 MG TABS tablet TAKE 1 TABLET (10 MG TOTAL) BY MOUTH DAILY BEFORE BREAKFAST. 30 tablet 11   digoxin  (LANOXIN ) 0.125 MG tablet TAKE 1 TABLET (0.125 MG TOTAL) BY MOUTH DAILY. (AM) 90 tablet 3   ENTRESTO  49-51 MG Take 1 tablet by mouth 2 (two) times daily. 180 tablet 3   magnesium  oxide (MAG-OX) 400 (240 Mg) MG tablet TAKE 1 TABLET (400 MG TOTAL) BY MOUTH DAILY. (AM) 90 tablet 3   metoprolol  (TOPROL -XL) 100 MG 24 hr tablet Take 1 tablet (100 mg total) by mouth daily. 90 tablet 3   metoprolol  succinate (TOPROL  XL) 100 MG 24 hr tablet Take 1 tablet (100 mg total) by mouth daily.     mirtazapine  (REMERON  SOL-TAB) 15 MG disintegrating tablet Take 1 tablet (15 mg total) by mouth at bedtime. 90 tablet 1   potassium chloride  SA (KLOR-CON  M20) 20 MEQ tablet Take 1 tablet (20 mEq total) by mouth daily. 90 tablet 3   spironolactone  (ALDACTONE ) 25 MG tablet TAKE 1 TABLET (25 MG TOTAL) BY MOUTH AT BEDTIME (PM) 90 tablet 3   tiotropium (SPIRIVA  HANDIHALER) 18 MCG inhalation capsule Place 1 capsule (18 mcg total) into inhaler and inhale daily. 90 capsule 1   furosemide  (LASIX ) 20 MG tablet Take 3 tablets (60 mg total) by mouth daily.     No current facility-administered medications for this encounter.   Wt Readings from Last 3 Encounters:  10/26/23 64 kg (141 lb)  08/03/23 62.1 kg (137 lb)  06/24/23 66.5 kg (146 lb 11.2 oz)   BP 130/80    Pulse 71   Wt 64 kg (141 lb)   SpO2 98%   BMI 21.44 kg/m  General: NAD Neck: No JVD, no thyromegaly or thyroid nodule.  Lungs: Clear to auscultation bilaterally with normal respiratory effort. CV: Nondisplaced PMI.  Heart regular S1/S2, no S3/S4, no murmur.  No peripheral edema.  No carotid bruit.  Normal pedal pulses.  Abdomen: Soft, nontender, no hepatosplenomegaly, no distention.  Skin: Intact without lesions or rashes.  Neurologic: Alert and oriented x 3.  Psych: Normal affect. Extremities: No clubbing or cyanosis.  HEENT: Normal.   Assessment/Plan: 1. Chronic systolic CHF: Nonischemic cardiomyopathy.  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  Cause of CMP uncertain => prior cocaine abuse but not currently.  He drinks about 3 beers/day, denies heavier drinking in past but apparently there was concern that he had had ETOH withdrawal seizures in the past.  His grandmother had CHF but apparently none of his 13 siblings have known cardiac disease. EF has been low since at least 2016.  Echo in 2/23 showed EF < 20%, severe LV dilation, moderately decreased RV systolic function with mild RV enlargement. He has a Secondary school teacher ICD.  CPX 9/23 showed severe functional limitation from both HF and deconditioning. Cardiac MRI 10/23, technically difficult study due to ICD artifact, LVEF 24%, no evidence of infiltrative disease. Echo 9/24 showed EF 25%. Chronic NYHA class III symptoms, COPD likely plays a role.  He is not volume overloaded by exam or Corvue.  - I will have him try to increase Entresto  back to 49/51 bid.  He will decrease Lasix  to 60 mg daily.  BMET/BNP today and BMET in 10 days.  - Continue Farxiga  10 mg daily. - Continue digoxin  0.125 mg daily. Check level today.  - Continue Toprol  XL 100 mg daily. - Continue spironolactone  25 mg daily.   -  Due for repeat echo, will order.  - Keep ETOH minimal. - He could not get insurance coverage for barostimulator. I am going to send him back  to Dr. Inocencio to see if he can get a cardiac contractility modulator.  - We have discussed advanced therapies. Not transplant candidate with on-going tobacco use.  Would likely be VAD candidate if needed.  2. COPD: Still smoking, I encouraged him to quit. - Failed nicotine  patches.  - Failed Chantix  (stopped due to nightmares).  - Unable to tolerate Wellbutrin .  3. Gout: on allopurinol .   Follow up in 6 wks with APP  I spent 32 minutes reviewing records, interviewing/examining patient, and managing orders.    Ezra Shuck,  10/26/2023

## 2023-10-26 NOTE — Patient Instructions (Signed)
 INCREASE Entresto  to 49/51 mg Twice daily  DECREASE Lasix  to 60 mg daily.  Labs done today, your results will be available in MyChart, we will contact you for abnormal readings.  Repeat blood work in 10 days.  You have been referred to Dr. Inocencio for the CCM device. His office will call you to arrange your appointment.  Your physician recommends that you schedule a follow-up appointment in: 6 weeks.  If you have any questions or concerns before your next appointment please send us  a message through [place] or call our office at [phone].    TO LEAVE A MESSAGE FOR THE NURSE SELECT OPTION 2, PLEASE LEAVE A MESSAGE INCLUDING: YOUR NAME DATE OF BIRTH CALL BACK NUMBER REASON FOR CALL**this is important as we prioritize the call backs  YOU WILL RECEIVE A CALL BACK THE SAME DAY AS LONG AS YOU CALL BEFORE 4:00 PM  At the [facility] Clinic, you and your health needs are our priority. As part of our continuing mission to provide you with exceptional heart care, we have created designated Provider Care Teams. These Care Teams include your primary Cardiologist (physician) and Advanced Practice Providers (APPs- Physician Assistants and Nurse Practitioners) who all work together to provide you with the care you need, when you need it.   You may see any of the following providers on your designated Care Team at your next follow up: Dr Toribio Fuel Dr Ezra Shuck Dr. Ria Commander Dr. Morene Brownie Amy Lenetta, NP Caffie Shed, [place] [facility] [place], [place] Beckey Coe, NP [place] Lee, NP Ellouise Class, NP Tinnie Redman, PharmD Jaun Bash, PharmD   Please be sure to bring in all your medications bottles to every appointment.    Thank you for choosing [facility]

## 2023-10-26 NOTE — Progress Notes (Signed)
 Remote ICD Transmission

## 2023-10-28 ENCOUNTER — Other Ambulatory Visit (HOSPITAL_COMMUNITY): Payer: Self-pay | Admitting: Family Medicine

## 2023-11-02 ENCOUNTER — Ambulatory Visit: Admitting: Family Medicine

## 2023-11-05 ENCOUNTER — Other Ambulatory Visit (HOSPITAL_COMMUNITY)

## 2023-11-09 ENCOUNTER — Ambulatory Visit: Payer: Medicaid Other

## 2023-11-09 DIAGNOSIS — I472 Ventricular tachycardia, unspecified: Secondary | ICD-10-CM | POA: Diagnosis not present

## 2023-11-11 ENCOUNTER — Ambulatory Visit: Payer: Self-pay | Admitting: Cardiology

## 2023-11-11 LAB — CUP PACEART REMOTE DEVICE CHECK
Battery Remaining Longevity: 52 mo
Battery Remaining Percentage: 51 %
Battery Voltage: 2.95 V
Brady Statistic RV Percent Paced: 1 %
Date Time Interrogation Session: 20251020021945
HighPow Impedance: 65 Ohm
HighPow Impedance: 65 Ohm
Implantable Lead Connection Status: 753985
Implantable Lead Implant Date: 20191230
Implantable Lead Location: 753860
Implantable Lead Model: 7122
Implantable Pulse Generator Implant Date: 20191230
Lead Channel Impedance Value: 450 Ohm
Lead Channel Pacing Threshold Amplitude: 0.5 V
Lead Channel Pacing Threshold Pulse Width: 0.5 ms
Lead Channel Sensing Intrinsic Amplitude: 11.6 mV
Lead Channel Setting Pacing Amplitude: 2.5 V
Lead Channel Setting Pacing Pulse Width: 0.5 ms
Lead Channel Setting Sensing Sensitivity: 0.5 mV
Pulse Gen Serial Number: 9850980
Zone Setting Status: 755011

## 2023-11-13 NOTE — Progress Notes (Signed)
 Remote ICD Transmission

## 2023-11-17 ENCOUNTER — Ambulatory Visit: Admitting: Family Medicine

## 2023-11-23 ENCOUNTER — Ambulatory Visit: Attending: Cardiology

## 2023-11-23 DIAGNOSIS — Z95810 Presence of automatic (implantable) cardiac defibrillator: Secondary | ICD-10-CM | POA: Diagnosis not present

## 2023-11-23 DIAGNOSIS — I5022 Chronic systolic (congestive) heart failure: Secondary | ICD-10-CM | POA: Diagnosis not present

## 2023-11-25 ENCOUNTER — Ambulatory Visit: Admitting: Student

## 2023-11-25 ENCOUNTER — Telehealth: Payer: Self-pay | Admitting: Student

## 2023-11-25 NOTE — Telephone Encounter (Signed)
 Called to discuss with patient.   Received word that his insurance has declined CCM consideration.   [employer] has recently obtained NCD, which may not help in getting Medicaid approvals, but we will work towards re-submitting.   No need to report for office visit today.   Cole Jodie Passey, PA-C  11/25/2023 11:10 AM

## 2023-11-25 NOTE — Progress Notes (Deleted)
  Electrophysiology Office Note:   ID:  [name], DOB [birth year], MRN [id number]  Primary Cardiologist: Annabella Scarce, MD Electrophysiologist: Will Gladis Norton, MD  {Click to update primary MD,subspecialty MD or APP then REFRESH:1}    History of Present Illness:   [name] is a 58 y.o. male with h/o HTN, chronic CHF, SVT, OSA, tobacco abuse, prior h/o substance abuse, and s/p ICD seen today for routine electrophysiology followup.   Since last being seen in our clinic the patient reports doing ***.  he denies chest pain, palpitations, dyspnea, PND, orthopnea, nausea, vomiting, dizziness, syncope, edema, weight gain, or early satiety.   Review of systems complete and found to be negative unless listed in HPI.   EP Information / Studies Reviewed:    EKG is not ordered today. EKG from 10/26/2023 reviewed which showed NSR at 71 bpm       ICD Interrogation-  Not performed today. Most recent remote stable.   Arrhythmia/Device History Abbott single chamber ICD implanted 01/18/2018   Physical Exam:   VS:  There were no vitals taken for this visit.   Wt Readings from Last 3 Encounters:  10/26/23 141 lb (64 kg)  08/03/23 137 lb (62.1 kg)  06/24/23 146 lb 11.2 oz (66.5 kg)     GEN: No acute distress *** NECK: No JVD; No carotid bruits CARDIAC: {EPRHYTHM:28826}, no murmurs, rubs, gallops RESPIRATORY:  Clear to auscultation without rales, wheezing or rhonchi  ABDOMEN: Soft, non-tender, non-distended EXTREMITIES:  {EDEMA LEVEL:28147::No} edema; No deformity   ASSESSMENT AND PLAN:    Chronic systolic CHF  s/p Abbott single chamber ICD  Stable on an appropriate medical regimen Normal ICD function by last report NYHA III symptoms  We are awaiting word re: CCM Coverage.   Disposition:   Follow up with {EPPROVIDERS:28135::EP Team} {EPFOLLOW UP:28173}   Signed, Ozell Prentice Passey, PA-C

## 2023-11-27 ENCOUNTER — Telehealth: Payer: Self-pay

## 2023-11-27 NOTE — Progress Notes (Signed)
 EPIC Encounter for ICM Monitoring  Patient Name: BLANCA THORNTON is a 58 y.o. male Date: 11/27/2023 Primary Care Physican: Delbert Clam, MD Primary Cardiologist: Rolan Electrophysiologist: Inocencio 02/17/2022 Weight: 142 lbs 08/22/2022 Office Weight: 138 lbs  10/15/2022 Weight:  139 lbs 12/23/2022 Office Weight: 141 lbs 04/02/2023 Office Weight:  140 lbs 05/04/2023 Office Weight: 136 lbs   05/11/2023 Office Weight: 139 lbs     06/24/2023 Office Weight: 146 lbs     10/26/2023 Office Weight: 141 lbs       SVT Episodes 56 NVST Episodes 28                                         Attempted call to patient and unable to reach. Transmission results reviewed.    Diet:  Does not follow low salt diet.   Since 10/19/2023 ICM Remote Transmission: CorVue thoracic impedance suggesting normal fluid levels with the exception of possible fluid accumulation from 10/27/2023-11/04/2023 and 11/23/2023-11/25/2023.  Pt has history of SVT & NSVT episodes.     Prescribed:  Furosemide  20 mg take 3 tablets (60 mg total) by mouth daily.   Potassium 20 mEq take 1 tablet by mouth daily. Spironolactone  25 mg take 1 tablet by mouth at bedtime    Labs: 10/26/2023 Creatinine 1.27, BUN 21, Potassium 4.0 Sodium 133, GFR >60 08/11/2023 Creatinine 1.27, BUN 22, Potassium 4.1, Sodium 134  06/22/2023 Creatinine 1.02, BUN 10, Potassium 3.4, Sodium 137, GFR >60  05/11/2023 Creatinine 1.19, BUN 15, Potassium 4.0, Sodium 136, GFR >60  A complete set of results can be found in Results Review.   Recommendations: Unable to reach.     Follow-up plan: ICM clinic phone appointment on 01/04/2024.    91 day device clinic remote transmission 02/08/2024.     EP/Cardiology Office Visits:  12/07/2023 with Dr Rolan.   Recall 04/04/2024 with Jodie Passey, PA.     Copy of ICM check sent to Dr. Inocencio.    Remote monitoring is medically necessary for Heart Failure Management.    Daily Thoracic Impedance ICM trend: 08/25/2023 through  11/23/2023.    12-14 Month Thoracic Impedance ICM trend:     [name], RN 11/27/2023 8:34 AM

## 2023-11-27 NOTE — Telephone Encounter (Signed)
 Remote ICM transmission received.  Attempted call to patient regarding ICM remote transmission and no answer.

## 2023-12-04 ENCOUNTER — Telehealth (HOSPITAL_COMMUNITY): Payer: Self-pay

## 2023-12-04 NOTE — Telephone Encounter (Signed)
 Called to confirm/remind patient of their appointment at the [facility] Clinic on 12/07/23.   Appointment:   [x] Confirmed  [] Left mess   [] No answer/No voice mail  [] VM Full/unable to leave message  [] Phone not in service  Patient reminded to bring all medications and/or complete list.  Confirmed patient has transportation. Gave directions, instructed to utilize valet parking.

## 2023-12-07 ENCOUNTER — Ambulatory Visit (HOSPITAL_COMMUNITY): Payer: Self-pay | Admitting: Family Medicine

## 2023-12-07 ENCOUNTER — Encounter (HOSPITAL_COMMUNITY): Payer: Self-pay

## 2023-12-07 ENCOUNTER — Ambulatory Visit (HOSPITAL_COMMUNITY)
Admission: RE | Admit: 2023-12-07 | Discharge: 2023-12-07 | Disposition: A | Discharge status: Home / Self Care | Source: Ambulatory Visit | Attending: Family Medicine | Admitting: Family Medicine

## 2023-12-07 VITALS — BP 122/68 | HR 98 | Ht 68.0 in | Wt 140.6 lb

## 2023-12-07 DIAGNOSIS — M109 Gout, unspecified: Secondary | ICD-10-CM | POA: Insufficient documentation

## 2023-12-07 DIAGNOSIS — I110 Hypertensive heart disease with heart failure: Secondary | ICD-10-CM | POA: Diagnosis not present

## 2023-12-07 DIAGNOSIS — J449 Chronic obstructive pulmonary disease, unspecified: Secondary | ICD-10-CM | POA: Diagnosis not present

## 2023-12-07 DIAGNOSIS — Z79899 Other long term (current) drug therapy: Secondary | ICD-10-CM | POA: Diagnosis not present

## 2023-12-07 DIAGNOSIS — I2510 Atherosclerotic heart disease of native coronary artery without angina pectoris: Secondary | ICD-10-CM | POA: Insufficient documentation

## 2023-12-07 DIAGNOSIS — I4729 Other ventricular tachycardia: Secondary | ICD-10-CM | POA: Diagnosis not present

## 2023-12-07 DIAGNOSIS — F17210 Nicotine dependence, cigarettes, uncomplicated: Secondary | ICD-10-CM | POA: Insufficient documentation

## 2023-12-07 DIAGNOSIS — I471 Supraventricular tachycardia, unspecified: Secondary | ICD-10-CM | POA: Diagnosis not present

## 2023-12-07 DIAGNOSIS — I428 Other cardiomyopathies: Secondary | ICD-10-CM | POA: Insufficient documentation

## 2023-12-07 DIAGNOSIS — I5022 Chronic systolic (congestive) heart failure: Secondary | ICD-10-CM

## 2023-12-07 DIAGNOSIS — Z7984 Long term (current) use of oral hypoglycemic drugs: Secondary | ICD-10-CM | POA: Insufficient documentation

## 2023-12-07 DIAGNOSIS — Z95810 Presence of automatic (implantable) cardiac defibrillator: Secondary | ICD-10-CM | POA: Insufficient documentation

## 2023-12-07 DIAGNOSIS — I472 Ventricular tachycardia, unspecified: Secondary | ICD-10-CM | POA: Diagnosis not present

## 2023-12-07 LAB — BASIC METABOLIC PANEL WITH GFR
Anion gap: 19 — ABNORMAL HIGH (ref 5–15)
BUN: 8 mg/dL (ref 6–20)
CO2: 20 mmol/L — ABNORMAL LOW (ref 22–32)
Calcium: 10 mg/dL (ref 8.9–10.3)
Chloride: 95 mmol/L — ABNORMAL LOW (ref 98–111)
Creatinine, Ser: 1.19 mg/dL (ref 0.61–1.24)
GFR, Estimated: >60 mL/min (ref >60)
Glucose, Bld: 109 mg/dL — ABNORMAL HIGH (ref 70–99)
Potassium: 3.2 mmol/L — ABNORMAL LOW (ref 3.5–5.1)
Sodium: 134 mmol/L — ABNORMAL LOW (ref 135–145)

## 2023-12-07 LAB — DIGOXIN LEVEL: Digoxin Level: <0.6 ng/mL — ABNORMAL LOW (ref 0.8–2.0)

## 2023-12-07 LAB — BRAIN NATRIURETIC PEPTIDE: B Natriuretic Peptide: 506.9 pg/mL — ABNORMAL HIGH (ref 0.0–100.0)

## 2023-12-07 LAB — MAGNESIUM: Magnesium: 1.6 mg/dL — ABNORMAL LOW (ref 1.7–2.4)

## 2023-12-07 MED ORDER — FUROSEMIDE 20 MG PO TABS: 60.0000 mg | ORAL_TABLET | Freq: Every day | ORAL | 11 refills | Status: DC

## 2023-12-07 MED ORDER — LOSARTAN POTASSIUM 25 MG PO TABS: 25.0000 mg | ORAL_TABLET | Freq: Every day | ORAL | 3 refills | Status: AC

## 2023-12-07 NOTE — Patient Instructions (Addendum)
 Thank you for coming in today  If you had labs drawn today, any labs that are abnormal the clinic will call you No news is good news  Medications: Stop Entresto  Stop Wellbutrin  Start Losartan  25 mg 1 tablet daily Decrease Lasix  to 60 mg daily   Follow up appointments:  Your physician recommends that you schedule a follow-up appointment in:  2 weeks after heart cath      [facility] [address] [place] [address] Dept: [phone] Loc: [phone]  [name]  12/07/2023  You are scheduled for a Cardiac Catheterization on Tuesday, December 9 with Dr. Ezra Shuck.  1. Please arrive at the [facility] (Main Entrance A) at [facility]: [address] [place], [place] [address] at 6:30 AM (This time is 1 hour(s) before your procedure to ensure your preparation).   Free valet parking service is available. You will check in at ADMITTING. The support person will be asked to wait in the waiting room.  It is OK to have someone drop you off and come back when you are ready to be discharged.    Special note: Every effort is made to have your procedure done on time. Please understand that emergencies sometimes delay scheduled procedures.  2. Diet: Nothing to eat after midnight.    Hydration: Nothing to eat and drink after midnight. (For TEE and Cath the same day)   Medication instructions in preparation for your procedure:   Contrast Allergy: No   Current Outpatient Medications (Endocrine & Metabolic):    dapagliflozin  propanediol (FARXIGA ) 10 MG TABS tablet, TAKE 1 TABLET (10 MG TOTAL) BY MOUTH DAILY BEFORE BREAKFAST. (AM)  Current Outpatient Medications (Cardiovascular):    atorvastatin  (LIPITOR) 40 MG tablet, Take 1 tablet (40 mg total) by mouth daily.   digoxin  (LANOXIN ) 0.125 MG tablet, TAKE 1 TABLET (0.125 MG TOTAL) BY MOUTH DAILY. (AM)   losartan  (COZAAR ) 25 MG tablet,  Take 1 tablet (25 mg total) by mouth daily.   metoprolol  (TOPROL -XL) 100 MG 24 hr tablet, Take 1 tablet (100 mg total) by mouth daily.   sacubitril -valsartan  (ENTRESTO ) 24-26 MG, Take 1 tablet by mouth 2 (two) times daily.   spironolactone  (ALDACTONE ) 25 MG tablet, TAKE 1 TABLET (25 MG TOTAL) BY MOUTH AT BEDTIME (PM)   furosemide  (LASIX ) 20 MG tablet, Take 3 tablets (60 mg total) by mouth daily.   metoprolol  succinate (TOPROL  XL) 100 MG 24 hr tablet, Take 1 tablet (100 mg total) by mouth daily.  Current Outpatient Medications (Respiratory):    ADVAIR  DISKUS 250-50 MCG/ACT AEPB, Inhale 1 puff into the lungs 2 (two) times daily. in the morning and at bedtime.   albuterol  (PROAIR  HFA) 108 (90 Base) MCG/ACT inhaler, Inhale 2 puffs into the lungs every 6 (six) hours as needed for shortness of breath.   tiotropium (SPIRIVA  HANDIHALER) 18 MCG inhalation capsule, Place 1 capsule (18 mcg total) into inhaler and inhale daily.  Current Outpatient Medications (Analgesics):    allopurinol  (ZYLOPRIM ) 100 MG tablet, TAKE 1 TABLET (100 MG TOTAL) BY MOUTH DAILY (AM)   aspirin  81 MG EC tablet, Take 81 mg by mouth in the morning. Swallow whole.   colchicine  0.6 MG tablet, Take 1 tablet (0.6 mg total) by mouth daily as needed (gout). FOLLOW UP WITH PCP FOR REFILLS (Patient not taking: Reported on 12/07/2023)   Current Outpatient Medications (Other):    potassium chloride  SA (KLOR-CON  M20) 20 MEQ  tablet, Take 1 tablet (20 mEq total) by mouth daily.   Blood Pressure Monitoring (BLOOD PRESSURE CUFF) MISC, Take daily blood pressures (Patient not taking: Reported on 12/07/2023)   magnesium  oxide (MAG-OX) 400 (240 Mg) MG tablet, TAKE 1 TABLET (400 MG TOTAL) BY MOUTH DAILY. (AM) (Patient not taking: Reported on 12/07/2023)   mirtazapine  (REMERON  SOL-TAB) 15 MG disintegrating tablet, Take 1 tablet (15 mg total) by mouth at bedtime. (Patient not taking: Reported on 12/07/2023) *For reference purposes while preparing  patient instructions.   Delete this med list prior to printing instructions for patient.*    On the morning of your procedure, take your Aspirin  81 mg and any morning medicines NOT listed above.  You may use sips of water.   Plan to go home the same day, you will only stay overnight if medically necessary.  Bring a current list of your medications and current insurance cards.  You MUST have a responsible person to drive you home.  Someone MUST be with you the first 24 hours after you arrive home or your discharge will be delayed.  Please wear clothes that are easy to get on and off and wear slip-on shoes.  Thank you for allowing us  to care for you!   -- [facility]   Do the following things EVERYDAY: Weigh yourself in the morning before breakfast. Write it down and keep it in a log. Take your medicines as prescribed Eat low salt foods--Limit salt (sodium) to 2000 mg per day.  Stay as active as you can everyday Limit all fluids for the day to less than 2 liters   At the [facility], you and your health needs are our priority. As part of our continuing mission to provide you with exceptional heart care, we have created designated Provider Care Teams. These Care Teams include your primary Cardiologist (physician) and Advanced Practice Providers (APPs- Physician Assistants and Nurse Practitioners) who all work together to provide you with the care you need, when you need it.   You may see any of the following providers on your designated Care Team at your next follow up: Dr Toribio Fuel Dr Ezra Shuck Dr. Ria Gardenia Greig Lenetta, NP Caffie Shed, [place] [facility] [place], [place] Beckey Coe, NP Tinnie Redman, PharmD   Please be sure to bring in all your medications bottles to every appointment.    Thank you for choosing [facility]  If you have any questions or concerns  before your next appointment please send us  a message through [place] or call our office at [phone].    TO LEAVE A MESSAGE FOR THE NURSE SELECT OPTION 2, PLEASE LEAVE A MESSAGE INCLUDING: YOUR NAME DATE OF BIRTH CALL BACK NUMBER REASON FOR CALL**this is important as we prioritize the call backs  YOU WILL RECEIVE A CALL BACK THE SAME DAY AS LONG AS YOU CALL BEFORE 4:00 PM

## 2023-12-07 NOTE — H&P (View-Only) (Signed)
 Advanced Heart Failure Clinic Note    PCP: Delbert Clam, MD HF Cardiology: Dr. Rolan  58 y.o. with history of nonischemic cardiomyopathy was referred by Rosaline Bane for evaluation of CHF.  Cardiomyopathy has been diagnosed since 2016 when echo showed EF 20%.  He has a history of cocaine use and ETOH abuse.  He is smoking.  No recent cocaine.  He had a stroke in 2017 in setting of uncontrolled HTN and cocaine abuse.  He has a [employer] ICD.  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  Last echo in 2/23 showed EF < 20%, severe LV dilation, moderately decreased RV systolic function with mild RV enlargement. He has 13 siblings, none of whom have known cardiac disease.  His grandmother had CHF and there apparently was conversation about a heart transplant but she passed away.  CPX 10-31-2023) showed severe functional limitation due to HF and deconditioning.   Cardiac MRI 10/23 (difficult images due to ICD artifact) LVEF 24%, RVEF 34%.  Echo 9/24 showed EF 25%, RV mildly reduced, prominent LV apical trabeculation  Today he returns for HF follow up. Overall feeling poorly. He has dyspnea walking short distances on flat ground. He is dizzy and fatigued, symptoms worse since restarting Entresto . He feels palpitations. He urinates briskly on Lasix  80 mg daily. Denies abnormal bleeding, CP, edema, or PND/Orthopnea. Appetite poor. Taking all medications. He is still smoking 8 cigarettes/day.  He uses inhalers for COPD.   ReDs reading: 30 %, normal  St Jude device interrogation (personally reviewed):  Stable thoracic impedance, he has had episodes of NSVT but no treated sustained VT; 42 SVT episodes, 25 NSVT episodes, <1% VP  ECG (personally reviewed): none ordered today.  Labs (3/24): K 3.6, creatinine 0.96 Labs (4/24): BNP 488, K 3.6, creatinine 1.11 Labs (8/24): K 3.7, creatinine 1.28 Labs (12/24): K 4.4, creatinine 1.15 Labs (4/25): K 4.0, creatinine 1.19, LDL 33 Labs (7/25): K  4.1, creatinine 1.29, hgb 11.9 Labs (10/25): K 4.0, creatinine 1.27  PMH: 1. H/o seizure disorder: ?ETOH. 2. HTN 3. 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  4. H/o SVT 5. OSA 6. COPD: Active smoker - CT chest in 3/25 with mild emphysema 7. VT: St Jude ICD.  ATP 6/23.  8. Chronic systolic CHF: Nonischemic cardiomyopathy.  Documented since at least 2016. St Jude ICD.  - Echo (3/16): EF 20% - Echo (2/23): EF < 20%, severe LV dilation, moderately decreased RV systolic function with mild RV enlargement.  - Coronary CTA (6/23): 81st percentile calcium  score, mild nonobstructive CAD.  - CPX 2023/10/31): Peak VO2: 14.3 (43% predicted peak VO2), VE/VCO2 slope: 39, OUES: 1.10, Peak RER: 0.70  - Cardiac MRI 10/23 (difficult images due to ICD artifact) LVEF 24%, RVEF 34%. - Echo (9/24): EF 25%, RV mildly reduced, prominent LV apical trabeculation 9. Prior h/o cocaine 10. Gout  Social History   Socioeconomic History   Marital status: Single    Spouse name: Not on file   Number of children: 1   Years of education: 12   Highest education level: Not on file  Occupational History   Not on file  Tobacco Use   Smoking status: Every Day    Current packs/day: 0.50    Average packs/day: 0.5 packs/day for 20.0 years (10.0 ttl pk-yrs)    Types: Cigarettes   Smokeless tobacco: Never   Tobacco comments:    02/25/22 down to 1/2 pack a day  Vaping Use   Vaping status: Never  Used  Substance and Sexual Activity   Alcohol use: Yes    Alcohol/week: 1.0 standard drink of alcohol    Types: 1 Cans of beer per week    Comment: socially    Drug use: Not Currently    Types: Cocaine    Comment: stopped using cocaine 11/19/14   Sexual activity: Not on file  Other Topics Concern   Not on file  Social History Narrative   Lives alone, nurse comes 2 hrs daily   Social Drivers of Health   Financial Resource Strain: Low Risk  (03/25/2023)   Overall Financial Resource Strain (CARDIA)    Difficulty of Paying  Living Expenses: Not very hard  Food Insecurity: No Food Insecurity (03/25/2023)   Hunger Vital Sign    Worried About Running Out of Food in the Last Year: Never true    Ran Out of Food in the Last Year: Never true  Transportation Needs: No Transportation Needs (03/25/2023)   PRAPARE - Administrator, Civil Service (Medical): No    Lack of Transportation (Non-Medical): No  Physical Activity: Inactive (03/25/2023)   Exercise Vital Sign    Days of Exercise per Week: 0 days    Minutes of Exercise per Session: 0 min  Stress: No Stress Concern Present (03/25/2023)   [employer] of Occupational Health - Occupational Stress Questionnaire    Feeling of Stress : Not at all  Social Connections: Moderately Integrated (03/25/2023)   Social Connection and Isolation Panel    Frequency of Communication with Friends and Family: More than three times a week    Frequency of Social Gatherings with Friends and Family: More than three times a week    Attends Religious Services: More than 4 times per year    Active Member of [employer] or Organizations: Yes    Attends Banker Meetings: Never    Marital Status: Never married  Intimate Partner Violence: Not At Risk (03/25/2023)   Humiliation, Afraid, Rape, and Kick questionnaire    Fear of Current or Ex-Partner: No    Emotionally Abused: No    Physically Abused: No    Sexually Abused: No   Family History  Problem Relation Age of Onset   Hypertension Mother    Hyperlipidemia Mother    Hypertension Father    Hypertension Sister    Hypertension Brother    Stroke Maternal Aunt    Colon cancer Neg Hx    Esophageal cancer Neg Hx    ROS: All systems reviewed and negative except as per HPI.   Current Outpatient Medications  Medication Sig Dispense Refill   ADVAIR  DISKUS 250-50 MCG/ACT AEPB Inhale 1 puff into the lungs 2 (two) times daily. in the morning and at bedtime. 180 each 1   albuterol  (PROAIR  HFA) 108 (90 Base) MCG/ACT inhaler  Inhale 2 puffs into the lungs every 6 (six) hours as needed for shortness of breath. 18 g 6   allopurinol  (ZYLOPRIM ) 100 MG tablet TAKE 1 TABLET (100 MG TOTAL) BY MOUTH DAILY (AM) 30 tablet 2   aspirin  81 MG EC tablet Take 81 mg by mouth in the morning. Swallow whole.     atorvastatin  (LIPITOR) 40 MG tablet Take 1 tablet (40 mg total) by mouth daily. 90 tablet 1   buPROPion  (WELLBUTRIN  SR) 150 MG 12 hr tablet TAKE 1 TABLET BY MOUTH 2 (TWO) TIMES DAILY. (AM +PM) 180 tablet 1   dapagliflozin  propanediol (FARXIGA ) 10 MG TABS tablet TAKE 1 TABLET (10 MG  TOTAL) BY MOUTH DAILY BEFORE BREAKFAST. (AM) 30 tablet 11   digoxin  (LANOXIN ) 0.125 MG tablet TAKE 1 TABLET (0.125 MG TOTAL) BY MOUTH DAILY. (AM) 90 tablet 3   furosemide  (LASIX ) 20 MG tablet Take 3 tablets (60 mg total) by mouth daily. (Patient taking differently: Take 80 mg by mouth daily. Filled from bubble pack)     metoprolol  (TOPROL -XL) 100 MG 24 hr tablet Take 1 tablet (100 mg total) by mouth daily. 90 tablet 3   potassium chloride  SA (KLOR-CON  M20) 20 MEQ tablet Take 1 tablet (20 mEq total) by mouth daily. 90 tablet 3   sacubitril -valsartan  (ENTRESTO ) 24-26 MG Take 1 tablet by mouth 2 (two) times daily.     spironolactone  (ALDACTONE ) 25 MG tablet TAKE 1 TABLET (25 MG TOTAL) BY MOUTH AT BEDTIME (PM) 90 tablet 3   tiotropium (SPIRIVA  HANDIHALER) 18 MCG inhalation capsule Place 1 capsule (18 mcg total) into inhaler and inhale daily. 90 capsule 1   Blood Pressure Monitoring (BLOOD PRESSURE CUFF) MISC Take daily blood pressures (Patient not taking: Reported on 12/07/2023) 1 each 0   colchicine  0.6 MG tablet Take 1 tablet (0.6 mg total) by mouth daily as needed (gout). FOLLOW UP WITH PCP FOR REFILLS (Patient not taking: Reported on 12/07/2023) 15 tablet 1   ENTRESTO  49-51 MG Take 1 tablet by mouth 2 (two) times daily. (Patient not taking: Reported on 12/07/2023) 180 tablet 3   magnesium  oxide (MAG-OX) 400 (240 Mg) MG tablet TAKE 1 TABLET (400 MG TOTAL)  BY MOUTH DAILY. (AM) (Patient not taking: Reported on 12/07/2023) 90 tablet 3   metoprolol  succinate (TOPROL  XL) 100 MG 24 hr tablet Take 1 tablet (100 mg total) by mouth daily.     mirtazapine  (REMERON  SOL-TAB) 15 MG disintegrating tablet Take 1 tablet (15 mg total) by mouth at bedtime. (Patient not taking: Reported on 12/07/2023) 90 tablet 1   No current facility-administered medications for this encounter.   Wt Readings from Last 3 Encounters:  12/07/23 63.8 kg (140 lb 9.6 oz)  10/26/23 64 kg (141 lb)  08/03/23 62.1 kg (137 lb)   BP 122/68   Pulse 98   Ht 5' 8 (1.727 m)   Wt 63.8 kg (140 lb 9.6 oz)   SpO2 98%   BMI 21.38 kg/m  Physical Exam General:  NAD. No resp difficulty, walked into clinic, fatigued-appearing HEENT: Normal Neck: Supple. No JVD. Cor: Regular rate & rhythm. No rubs, gallops or murmurs. Lungs: Diminished throughout Abdomen: Soft, nontender, nondistended.  Extremities: No cyanosis, clubbing, rash, edema Neuro: Alert & oriented x 3, moves all 4 extremities w/o difficulty. Affect pleasant.  Assessment/Plan: 1. Chronic systolic CHF: Nonischemic cardiomyopathy.  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  Cause of CMP uncertain => prior cocaine abuse but not currently.  He drinks about 3 beers/day, denies heavier drinking in past but apparently there was concern that he had had ETOH withdrawal seizures in the past.  His grandmother had CHF but apparently none of his 13 siblings have known cardiac disease. EF has been low since at least 2016.  Echo in 2/23 showed EF < 20%, severe LV dilation, moderately decreased RV systolic function with mild RV enlargement. He has a [employer] ICD.  CPX 9/23 showed severe functional limitation from both HF and deconditioning. Cardiac MRI 10/23, technically difficult study due to ICD artifact, LVEF 24%, no evidence of infiltrative disease. Echo 9/24 showed EF 25%. Chronic NYHA class III symptoms, worse recently. COPD likely plays a  role.  He is not volume overloaded by exam or Corvue. ReDs 30% - Has felt worse on Entresto , so will stop and restart losartan  25 mg daily. BMET/BNP today. - Decrease Lasix  to 60 mg daily with orthostasis. - Continue Farxiga  10 mg daily. - Continue digoxin  0.125 mg daily. Took dig today, needs a digoxin  trough. - Continue Toprol  XL 100 mg daily. - Continue spironolactone  25 mg daily.   - Update echo. - Keep ETOH minimal. - WIth worsening symptoms and more frequent SVT/NSVT on device interrogation, I worry he is nearing need for advanced therapies. We discussed repeating CPX, but he feels he would not be able to ride bike or walk on TM due to leg weakness/pain. Discussed RHC to assess hemodynamics, he is agreeable. He understands he is not a transplant candidate due to continued smoking, would likely be a VAD candidate. Will arrange RHC with Dr. Rolan soon. Discussed with patient and Dr. Rolan  Informed Consent   Shared Decision Making/Informed Consent The risks, including but not limited to, [bleeding or vascular complications (1 in 500), pneumothorax (1 in 1600), arrhythmia (1 in 1000) and death (1 in 5000)], benefits (diagnostic support and/or management of heart failure, pulmonary hypertension) and alternatives of a right heart catheterization were discussed in detail with Mr. Copes and he is willing to proceed.     - He could not get insurance coverage for barostimulator. We have referred him back to Dr. Inocencio to see if he can get a cardiac contractility modulator (insurance declined CCM), EP working on re-submitting. 2. COPD: Still smoking 8 cigarettes/day, I encouraged him to quit. He is trying - Failed nicotine  patches.  - Failed Chantix  (stopped due to nightmares).  - Unable to tolerate Wellbutrin  so will discontinue this. 3. Gout: on allopurinol .  4. SVT/NSVT: Frequent on device interrogation today. - Check labs. - Follows with EP.  Follow up with APP 2 weeks after RHC.    Harlene HERO Clinton, FNP-BC 12/07/2023

## 2023-12-07 NOTE — Progress Notes (Addendum)
 Advanced Heart Failure Clinic Note    PCP: Delbert Clam, MD HF Cardiology: Dr. Rolan  58 y.o. with history of nonischemic cardiomyopathy was referred by Rosaline Bane for evaluation of CHF.  Cardiomyopathy has been diagnosed since 2016 when echo showed EF 20%.  He has a history of cocaine use and ETOH abuse.  He is smoking.  No recent cocaine.  He had a stroke in 2017 in setting of uncontrolled HTN and cocaine abuse.  He has a [employer] ICD.  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  Last echo in 2/23 showed EF < 20%, severe LV dilation, moderately decreased RV systolic function with mild RV enlargement. He has 13 siblings, none of whom have known cardiac disease.  His grandmother had CHF and there apparently was conversation about a heart transplant but she passed away.  CPX 10-31-2023) showed severe functional limitation due to HF and deconditioning.   Cardiac MRI 10/23 (difficult images due to ICD artifact) LVEF 24%, RVEF 34%.  Echo 9/24 showed EF 25%, RV mildly reduced, prominent LV apical trabeculation  Today he returns for HF follow up. Overall feeling poorly. He has dyspnea walking short distances on flat ground. He is dizzy and fatigued, symptoms worse since restarting Entresto . He feels palpitations. He urinates briskly on Lasix  80 mg daily. Denies abnormal bleeding, CP, edema, or PND/Orthopnea. Appetite poor. Taking all medications. He is still smoking 8 cigarettes/day.  He uses inhalers for COPD.   ReDs reading: 30 %, normal  St Jude device interrogation (personally reviewed):  Stable thoracic impedance, he has had episodes of NSVT but no treated sustained VT; 42 SVT episodes, 25 NSVT episodes, <1% VP  ECG (personally reviewed): none ordered today.  Labs (3/24): K 3.6, creatinine 0.96 Labs (4/24): BNP 488, K 3.6, creatinine 1.11 Labs (8/24): K 3.7, creatinine 1.28 Labs (12/24): K 4.4, creatinine 1.15 Labs (4/25): K 4.0, creatinine 1.19, LDL 33 Labs (7/25): K  4.1, creatinine 1.29, hgb 11.9 Labs (10/25): K 4.0, creatinine 1.27  PMH: 1. H/o seizure disorder: ?ETOH. 2. HTN 3. 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  4. H/o SVT 5. OSA 6. COPD: Active smoker - CT chest in 3/25 with mild emphysema 7. VT: St Jude ICD.  ATP 6/23.  8. Chronic systolic CHF: Nonischemic cardiomyopathy.  Documented since at least 2016. St Jude ICD.  - Echo (3/16): EF 20% - Echo (2/23): EF < 20%, severe LV dilation, moderately decreased RV systolic function with mild RV enlargement.  - Coronary CTA (6/23): 81st percentile calcium  score, mild nonobstructive CAD.  - CPX 2023/10/31): Peak VO2: 14.3 (43% predicted peak VO2), VE/VCO2 slope: 39, OUES: 1.10, Peak RER: 0.70  - Cardiac MRI 10/23 (difficult images due to ICD artifact) LVEF 24%, RVEF 34%. - Echo (9/24): EF 25%, RV mildly reduced, prominent LV apical trabeculation 9. Prior h/o cocaine 10. Gout  Social History   Socioeconomic History   Marital status: Single    Spouse name: Not on file   Number of children: 1   Years of education: 12   Highest education level: Not on file  Occupational History   Not on file  Tobacco Use   Smoking status: Every Day    Current packs/day: 0.50    Average packs/day: 0.5 packs/day for 20.0 years (10.0 ttl pk-yrs)    Types: Cigarettes   Smokeless tobacco: Never   Tobacco comments:    02/25/22 down to 1/2 pack a day  Vaping Use   Vaping status: Never  Used  Substance and Sexual Activity   Alcohol use: Yes    Alcohol/week: 1.0 standard drink of alcohol    Types: 1 Cans of beer per week    Comment: socially    Drug use: Not Currently    Types: Cocaine    Comment: stopped using cocaine 11/19/14   Sexual activity: Not on file  Other Topics Concern   Not on file  Social History Narrative   Lives alone, nurse comes 2 hrs daily   Social Drivers of Health   Financial Resource Strain: Low Risk  (03/25/2023)   Overall Financial Resource Strain (CARDIA)    Difficulty of Paying  Living Expenses: Not very hard  Food Insecurity: No Food Insecurity (03/25/2023)   Hunger Vital Sign    Worried About Running Out of Food in the Last Year: Never true    Ran Out of Food in the Last Year: Never true  Transportation Needs: No Transportation Needs (03/25/2023)   PRAPARE - Administrator, Civil Service (Medical): No    Lack of Transportation (Non-Medical): No  Physical Activity: Inactive (03/25/2023)   Exercise Vital Sign    Days of Exercise per Week: 0 days    Minutes of Exercise per Session: 0 min  Stress: No Stress Concern Present (03/25/2023)   [employer] of Occupational Health - Occupational Stress Questionnaire    Feeling of Stress : Not at all  Social Connections: Moderately Integrated (03/25/2023)   Social Connection and Isolation Panel    Frequency of Communication with Friends and Family: More than three times a week    Frequency of Social Gatherings with Friends and Family: More than three times a week    Attends Religious Services: More than 4 times per year    Active Member of [employer] or Organizations: Yes    Attends Banker Meetings: Never    Marital Status: Never married  Intimate Partner Violence: Not At Risk (03/25/2023)   Humiliation, Afraid, Rape, and Kick questionnaire    Fear of Current or Ex-Partner: No    Emotionally Abused: No    Physically Abused: No    Sexually Abused: No   Family History  Problem Relation Age of Onset   Hypertension Mother    Hyperlipidemia Mother    Hypertension Father    Hypertension Sister    Hypertension Brother    Stroke Maternal Aunt    Colon cancer Neg Hx    Esophageal cancer Neg Hx    ROS: All systems reviewed and negative except as per HPI.   Current Outpatient Medications  Medication Sig Dispense Refill   ADVAIR  DISKUS 250-50 MCG/ACT AEPB Inhale 1 puff into the lungs 2 (two) times daily. in the morning and at bedtime. 180 each 1   albuterol  (PROAIR  HFA) 108 (90 Base) MCG/ACT inhaler  Inhale 2 puffs into the lungs every 6 (six) hours as needed for shortness of breath. 18 g 6   allopurinol  (ZYLOPRIM ) 100 MG tablet TAKE 1 TABLET (100 MG TOTAL) BY MOUTH DAILY (AM) 30 tablet 2   aspirin  81 MG EC tablet Take 81 mg by mouth in the morning. Swallow whole.     atorvastatin  (LIPITOR) 40 MG tablet Take 1 tablet (40 mg total) by mouth daily. 90 tablet 1   buPROPion  (WELLBUTRIN  SR) 150 MG 12 hr tablet TAKE 1 TABLET BY MOUTH 2 (TWO) TIMES DAILY. (AM +PM) 180 tablet 1   dapagliflozin  propanediol (FARXIGA ) 10 MG TABS tablet TAKE 1 TABLET (10 MG  TOTAL) BY MOUTH DAILY BEFORE BREAKFAST. (AM) 30 tablet 11   digoxin  (LANOXIN ) 0.125 MG tablet TAKE 1 TABLET (0.125 MG TOTAL) BY MOUTH DAILY. (AM) 90 tablet 3   furosemide  (LASIX ) 20 MG tablet Take 3 tablets (60 mg total) by mouth daily. (Patient taking differently: Take 80 mg by mouth daily. Filled from bubble pack)     metoprolol  (TOPROL -XL) 100 MG 24 hr tablet Take 1 tablet (100 mg total) by mouth daily. 90 tablet 3   potassium chloride  SA (KLOR-CON  M20) 20 MEQ tablet Take 1 tablet (20 mEq total) by mouth daily. 90 tablet 3   sacubitril -valsartan  (ENTRESTO ) 24-26 MG Take 1 tablet by mouth 2 (two) times daily.     spironolactone  (ALDACTONE ) 25 MG tablet TAKE 1 TABLET (25 MG TOTAL) BY MOUTH AT BEDTIME (PM) 90 tablet 3   tiotropium (SPIRIVA  HANDIHALER) 18 MCG inhalation capsule Place 1 capsule (18 mcg total) into inhaler and inhale daily. 90 capsule 1   Blood Pressure Monitoring (BLOOD PRESSURE CUFF) MISC Take daily blood pressures (Patient not taking: Reported on 12/07/2023) 1 each 0   colchicine  0.6 MG tablet Take 1 tablet (0.6 mg total) by mouth daily as needed (gout). FOLLOW UP WITH PCP FOR REFILLS (Patient not taking: Reported on 12/07/2023) 15 tablet 1   ENTRESTO  49-51 MG Take 1 tablet by mouth 2 (two) times daily. (Patient not taking: Reported on 12/07/2023) 180 tablet 3   magnesium  oxide (MAG-OX) 400 (240 Mg) MG tablet TAKE 1 TABLET (400 MG TOTAL)  BY MOUTH DAILY. (AM) (Patient not taking: Reported on 12/07/2023) 90 tablet 3   metoprolol  succinate (TOPROL  XL) 100 MG 24 hr tablet Take 1 tablet (100 mg total) by mouth daily.     mirtazapine  (REMERON  SOL-TAB) 15 MG disintegrating tablet Take 1 tablet (15 mg total) by mouth at bedtime. (Patient not taking: Reported on 12/07/2023) 90 tablet 1   No current facility-administered medications for this encounter.   Wt Readings from Last 3 Encounters:  12/07/23 63.8 kg (140 lb 9.6 oz)  10/26/23 64 kg (141 lb)  08/03/23 62.1 kg (137 lb)   BP 122/68   Pulse 98   Ht 5' 8 (1.727 m)   Wt 63.8 kg (140 lb 9.6 oz)   SpO2 98%   BMI 21.38 kg/m  Physical Exam General:  NAD. No resp difficulty, walked into clinic, fatigued-appearing HEENT: Normal Neck: Supple. No JVD. Cor: Regular rate & rhythm. No rubs, gallops or murmurs. Lungs: Diminished throughout Abdomen: Soft, nontender, nondistended.  Extremities: No cyanosis, clubbing, rash, edema Neuro: Alert & oriented x 3, moves all 4 extremities w/o difficulty. Affect pleasant.  Assessment/Plan: 1. Chronic systolic CHF: Nonischemic cardiomyopathy.  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  Cause of CMP uncertain => prior cocaine abuse but not currently.  He drinks about 3 beers/day, denies heavier drinking in past but apparently there was concern that he had had ETOH withdrawal seizures in the past.  His grandmother had CHF but apparently none of his 13 siblings have known cardiac disease. EF has been low since at least 2016.  Echo in 2/23 showed EF < 20%, severe LV dilation, moderately decreased RV systolic function with mild RV enlargement. He has a [employer] ICD.  CPX 9/23 showed severe functional limitation from both HF and deconditioning. Cardiac MRI 10/23, technically difficult study due to ICD artifact, LVEF 24%, no evidence of infiltrative disease. Echo 9/24 showed EF 25%. Chronic NYHA class III symptoms, worse recently. COPD likely plays a  role.  He is not volume overloaded by exam or Corvue. ReDs 30% - Has felt worse on Entresto , so will stop and restart losartan  25 mg daily. BMET/BNP today. - Decrease Lasix  to 60 mg daily with orthostasis. - Continue Farxiga  10 mg daily. - Continue digoxin  0.125 mg daily. Took dig today, needs a digoxin  trough. - Continue Toprol  XL 100 mg daily. - Continue spironolactone  25 mg daily.   - Update echo. - Keep ETOH minimal. - WIth worsening symptoms and more frequent SVT/NSVT on device interrogation, I worry he is nearing need for advanced therapies. We discussed repeating CPX, but he feels he would not be able to ride bike or walk on TM due to leg weakness/pain. Discussed RHC to assess hemodynamics, he is agreeable. He understands he is not a transplant candidate due to continued smoking, would likely be a VAD candidate. Will arrange RHC with Dr. Rolan soon. Discussed with patient and Dr. Rolan  Informed Consent   Shared Decision Making/Informed Consent The risks, including but not limited to, [bleeding or vascular complications (1 in 500), pneumothorax (1 in 1600), arrhythmia (1 in 1000) and death (1 in 5000)], benefits (diagnostic support and/or management of heart failure, pulmonary hypertension) and alternatives of a right heart catheterization were discussed in detail with Mr. Copes and he is willing to proceed.     - He could not get insurance coverage for barostimulator. We have referred him back to Dr. Inocencio to see if he can get a cardiac contractility modulator (insurance declined CCM), EP working on re-submitting. 2. COPD: Still smoking 8 cigarettes/day, I encouraged him to quit. He is trying - Failed nicotine  patches.  - Failed Chantix  (stopped due to nightmares).  - Unable to tolerate Wellbutrin  so will discontinue this. 3. Gout: on allopurinol .  4. SVT/NSVT: Frequent on device interrogation today. - Check labs. - Follows with EP.  Follow up with APP 2 weeks after RHC.    Harlene HERO Clinton, FNP-BC 12/07/2023

## 2023-12-07 NOTE — Addendum Note (Signed)
 Encounter addended by: Glena Harlene HERO, FNP on: 12/07/2023 1:39 PM  Actions taken: Clinical Note Signed

## 2023-12-07 NOTE — Progress Notes (Signed)
 ReDS Vest / Clip - 12/07/23 1203       ReDS Vest / Clip   Station Marker C    Ruler Value 31.5    ReDS Value Range Low volume    ReDS Actual Value 30

## 2023-12-09 ENCOUNTER — Other Ambulatory Visit (HOSPITAL_COMMUNITY): Payer: Self-pay | Admitting: Cardiology

## 2023-12-09 ENCOUNTER — Other Ambulatory Visit: Payer: Self-pay | Admitting: Family Medicine

## 2023-12-09 DIAGNOSIS — I426 Alcoholic cardiomyopathy: Secondary | ICD-10-CM

## 2023-12-09 MED ORDER — FUROSEMIDE 20 MG PO TABS: 80.0000 mg | ORAL_TABLET | Freq: Every day | ORAL | 11 refills | Status: AC

## 2023-12-09 MED ORDER — POTASSIUM CHLORIDE CRYS ER 20 MEQ PO TBCR: 60.0000 meq | EXTENDED_RELEASE_TABLET | Freq: Every day | ORAL | 3 refills | Status: AC

## 2023-12-09 NOTE — Telephone Encounter (Signed)
 Pt aware.

## 2023-12-09 NOTE — Addendum Note (Signed)
 Addended by: Adael Culbreath, DALTON HERO on: 12/09/2023 03:51 PM   Modules accepted: Orders

## 2023-12-11 ENCOUNTER — Telehealth: Payer: Self-pay | Admitting: *Deleted

## 2023-12-11 NOTE — Telephone Encounter (Signed)
 Reviewed the patient's transmission from 11/23/23. Most episodes do no have EGM's available.   The patient has a history of SVT, and per his medication list, is taking: - Digoxin  0.125 mg every day - Metoprolol  succinate 100 mg every day    He was last seen in the office on 06/24/23 with Jodie Passey, PA. Per that visit, the patient was to follow up with an EP APP in 3 months. He no showed for a visit on 10/02/23 and had an appointment on 11/25/23 cancelled by our office.   Will reach out to EP scheduling to please schedule this patient a next available appointment with an EP APP for recurrent episodes of SVT and possible overdue follow up.

## 2023-12-11 NOTE — Telephone Encounter (Signed)
-----   Message from Pray H sent at 12/11/2023  8:58 AM EST ----- Regarding: FW: Episodes with Alert Conditions  ----- Message ----- From: Armanda Mitzie RAMAN, RN Sent: 11/27/2023   8:36 AM EST To: Cvd-Ep Scheduling Subject: Episodes with Alert Conditions                 Good morning  11-23-2023 Merlin report shows 55 episodes with alert conditions.  SVT Episodes 56 and NSVT 28.  Would you please review and discuss with Dr Inocencio if needed.  Thanks, Mitzie

## 2023-12-14 ENCOUNTER — Ambulatory Visit: Admitting: Family Medicine

## 2023-12-14 ENCOUNTER — Telehealth: Payer: Self-pay | Admitting: Family Medicine

## 2023-12-14 NOTE — Telephone Encounter (Addendum)
 Contacted patient. Patient scheduled for 12/1 at 1:50 pm.

## 2023-12-14 NOTE — Telephone Encounter (Signed)
 Copied from CRM #[id number]. Topic: Appointments - Scheduling Inquiry for Clinic >> Dec 14, 2023  1:51 PM Shanda MATSU wrote:  Reason for CRM: Patient called in wanting to know if there is any way he can be sooner as he is scheduled to have surgery in December, patient was not able to make it to today's appt due to transportation canceling the trip.

## 2023-12-16 ENCOUNTER — Ambulatory Visit (HOSPITAL_COMMUNITY)

## 2023-12-21 ENCOUNTER — Ambulatory Visit: Attending: Family Medicine | Admitting: Family Medicine

## 2023-12-21 ENCOUNTER — Encounter: Payer: Self-pay | Admitting: Family Medicine

## 2023-12-21 VITALS — BP 110/72 | HR 94 | Temp 98.4°F | Ht 68.0 in | Wt 142.6 lb

## 2023-12-21 DIAGNOSIS — J438 Other emphysema: Secondary | ICD-10-CM

## 2023-12-21 DIAGNOSIS — E876 Hypokalemia: Secondary | ICD-10-CM | POA: Diagnosis not present

## 2023-12-21 DIAGNOSIS — I5022 Chronic systolic (congestive) heart failure: Secondary | ICD-10-CM

## 2023-12-21 DIAGNOSIS — R630 Anorexia: Secondary | ICD-10-CM

## 2023-12-21 DIAGNOSIS — I110 Hypertensive heart disease with heart failure: Secondary | ICD-10-CM

## 2023-12-21 DIAGNOSIS — I426 Alcoholic cardiomyopathy: Secondary | ICD-10-CM

## 2023-12-21 DIAGNOSIS — R252 Cramp and spasm: Secondary | ICD-10-CM | POA: Diagnosis not present

## 2023-12-21 DIAGNOSIS — Z125 Encounter for screening for malignant neoplasm of prostate: Secondary | ICD-10-CM | POA: Diagnosis not present

## 2023-12-21 MED ORDER — MAGNESIUM OXIDE -MG SUPPLEMENT 400 (240 MG) MG PO TABS: 400.0000 mg | ORAL_TABLET | Freq: Two times a day (BID) | ORAL | 1 refills | Status: DC

## 2023-12-21 MED ORDER — CYCLOBENZAPRINE HCL 10 MG PO TABS: 10.0000 mg | ORAL_TABLET | Freq: Every evening | ORAL | 1 refills | Status: DC | PRN
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
## 2023-12-21 NOTE — Patient Instructions (Signed)
 VISIT SUMMARY:  [name], a 58 year old male with heart failure and COPD, presented with cramping and shortness of breath. He experiences cramping mainly at night and has shortness of breath attributed to his COPD and heart failure. He continues to smoke and has a poor appetite. He is also trying to complete colon cancer screening but has had difficulty collecting a specimen.  YOUR PLAN:  -HYPERTENSIVE HEART DISEASE WITH HEART FAILURE: Your heart rate was elevated, likely because you missed your metoprolol  dose. Your heart function is poor with an ejection fraction of 25%. Please ensure you take metoprolol  daily to control your heart rate. You should proceed with your scheduled cardiac catheterization on December 9th.  -CHRONIC OBSTRUCTIVE PULMONARY DISEASE (COPD): Your shortness of breath and wheezing are related to your COPD, which is worsened by smoking. Please continue your current inhaler regimen (Spiriva , Advair , and Proair  as needed) and consider quitting smoking to improve your COPD and heart failure.  -TOBACCO USE DISORDER: You continue to smoke despite previous attempts to quit. Please continue using bupropion  for smoking cessation, even though it causes headaches when taken with supper.  -ELECTROLYTE ABNORMALITIES (HYPOKALEMIA AND HYPOMAGNESEMIA): Your potassium and magnesium  levels have been low, likely due to your diuretic use. We checked your potassium level today and refilled your magnesium  prescription to address these imbalances.  -MUSCLE CRAMPS: Your muscle cramps are likely related to electrolyte imbalances and medication side effects. We prescribed Flexeril  to be taken at night to help alleviate the cramps and refilled your magnesium  prescription.  -DECREASED APPETITE: Your decreased appetite may be related to your heart failure and medication side effects. Please consider taking mirtazapine  to help with your appetite and sleep.  -COLON CANCER SCREENING NEEDED: You have  had difficulty collecting a specimen for your stool test. It is important to complete this screening given your age and health status. Please try to complete the colon cancer screening using a new stool test kit.  INSTRUCTIONS:  Please ensure you take metoprolol  daily to control your heart rate. Proceed with your scheduled cardiac catheterization on December 9th. Continue your current inhaler regimen and consider quitting smoking. Continue using bupropion  for smoking cessation. We checked your potassium level today and refilled your magnesium  prescription. Take Flexeril  at night to help with muscle cramps. Consider taking mirtazapine  to help with your appetite and sleep. Try to complete the colon cancer screening using a new stool test kit.

## 2023-12-22 LAB — PSA, TOTAL AND FREE
PSA, Free Pct: 26.7 %
PSA, Free: 0.16 ng/mL
Prostate Specific Ag, Serum: 0.6 ng/mL (ref 0.0–4.0)

## 2023-12-22 LAB — POTASSIUM: Potassium: 4.2 mmol/L (ref 3.5–5.2)

## 2023-12-23 ENCOUNTER — Ambulatory Visit: Payer: Self-pay | Admitting: Family Medicine

## 2023-12-23 NOTE — Telephone Encounter (Signed)
 Spoke w/ patient - he is scheduled to see Charlies Arthur, PA on 1/7. Sooner appts were offered - 12/9 and 1/2. However, he declined both as he is having a cath 12/9 and needs early afternoon.

## 2023-12-28 ENCOUNTER — Ambulatory Visit: Payer: Self-pay

## 2023-12-28 NOTE — Telephone Encounter (Signed)
 Patient mother called, she in not with patient. This writer attempted to reach patient for triage, left message to call back.   Copied from CRM #[id number]. Topic: Clinical - Medication Question >> Dec 28, 2023 11:52 AM Terri MATSU wrote: Reason for CRM: Patient mom Roseline calling regarding patient has a changed of his medications and has been stuttering bad so she know what medication she changed and a list of his medications. Callback number [phone] Answer Assessment - Initial Assessment Questions Additional info:  1) Mother calling to ask which medication was changed during last visit because he is stuttering since Friday. This has happened in the past with medication changes. Medication is in bubble packs, he tells his mother he doesn't know what medication is for because it only names the medication but not indication and dosing. She is not with patient at this time, she does not know all the symptoms he is experiencing. Mother is requesting for him to be referred to neurology. Plan for this writer to reach out to patient for further triage.   2) This writer attempted to reach patient, left message to call back for further triage.   Note: History of CVA.  Scheduled for cardiac cath on 12/29/23 Last OV with pcp 12/21/23-new Flexaril at night for muscle cramps. Encouraged to restart magnesium .  1. SYMPTOM: What is the main symptom you are concerned about? (e.g., weakness, numbness)     stuttering 2. ONSET: When did this start? (e.g., minutes, hours, days; while sleeping)     Friday 3. LAST NORMAL: When was the last time you (the patient) were normal (no symptoms)?     Friday 4. PATTERN Does this come and go, or has it been constant since it started?  Is it present now?     Constant 5. CARDIAC SYMPTOMS: Have you had any of the following symptoms: chest pain, difficulty breathing, palpitations?      6. NEUROLOGIC SYMPTOMS: Have you had any of the following symptoms: headache,  dizziness, vision loss, double vision, changes in speech, unsteady on your feet?     Friday complained of head wobbly.  7. OTHER SYMPTOMS: Do you have any other symptoms?  Protocols used: Neurologic Deficit-A-AH

## 2023-12-29 ENCOUNTER — Encounter (HOSPITAL_COMMUNITY): Payer: Self-pay | Admitting: Cardiology

## 2023-12-29 ENCOUNTER — Encounter (HOSPITAL_COMMUNITY): Admission: RE | Disposition: A | Payer: Self-pay | Source: Home / Self Care | Attending: Cardiology

## 2023-12-29 ENCOUNTER — Ambulatory Visit (HOSPITAL_COMMUNITY)
Admission: RE | Admit: 2023-12-29 | Discharge: 2023-12-29 | Disposition: A | Attending: Cardiology | Admitting: Cardiology

## 2023-12-29 ENCOUNTER — Other Ambulatory Visit: Payer: Self-pay

## 2023-12-29 DIAGNOSIS — I272 Pulmonary hypertension, unspecified: Secondary | ICD-10-CM | POA: Diagnosis not present

## 2023-12-29 DIAGNOSIS — Z8673 Personal history of transient ischemic attack (TIA), and cerebral infarction without residual deficits: Secondary | ICD-10-CM | POA: Diagnosis not present

## 2023-12-29 DIAGNOSIS — Z7982 Long term (current) use of aspirin: Secondary | ICD-10-CM | POA: Diagnosis not present

## 2023-12-29 DIAGNOSIS — J449 Chronic obstructive pulmonary disease, unspecified: Secondary | ICD-10-CM | POA: Diagnosis not present

## 2023-12-29 DIAGNOSIS — I428 Other cardiomyopathies: Secondary | ICD-10-CM | POA: Diagnosis not present

## 2023-12-29 DIAGNOSIS — Z8249 Family history of ischemic heart disease and other diseases of the circulatory system: Secondary | ICD-10-CM | POA: Diagnosis not present

## 2023-12-29 DIAGNOSIS — M109 Gout, unspecified: Secondary | ICD-10-CM | POA: Diagnosis not present

## 2023-12-29 DIAGNOSIS — I509 Heart failure, unspecified: Secondary | ICD-10-CM | POA: Diagnosis not present

## 2023-12-29 DIAGNOSIS — I110 Hypertensive heart disease with heart failure: Secondary | ICD-10-CM | POA: Diagnosis not present

## 2023-12-29 DIAGNOSIS — I5022 Chronic systolic (congestive) heart failure: Secondary | ICD-10-CM | POA: Diagnosis not present

## 2023-12-29 DIAGNOSIS — Z95810 Presence of automatic (implantable) cardiac defibrillator: Secondary | ICD-10-CM | POA: Diagnosis not present

## 2023-12-29 DIAGNOSIS — I5042 Chronic combined systolic (congestive) and diastolic (congestive) heart failure: Secondary | ICD-10-CM

## 2023-12-29 DIAGNOSIS — Z79899 Other long term (current) drug therapy: Secondary | ICD-10-CM | POA: Diagnosis not present

## 2023-12-29 DIAGNOSIS — I471 Supraventricular tachycardia, unspecified: Secondary | ICD-10-CM | POA: Diagnosis not present

## 2023-12-29 DIAGNOSIS — F17210 Nicotine dependence, cigarettes, uncomplicated: Secondary | ICD-10-CM | POA: Diagnosis not present

## 2023-12-29 HISTORY — PX: RIGHT HEART CATH: CATH118263

## 2023-12-29 LAB — CBC
HCT: 32.6 % — ABNORMAL LOW (ref 39.0–52.0)
Hemoglobin: 11.6 g/dL — ABNORMAL LOW (ref 13.0–17.0)
MCH: 35.9 pg — ABNORMAL HIGH (ref 26.0–34.0)
MCHC: 35.6 g/dL (ref 30.0–36.0)
MCV: 100.9 fL — ABNORMAL HIGH (ref 80.0–100.0)
Platelets: 158 K/uL (ref 150–400)
RBC: 3.23 MIL/uL — ABNORMAL LOW (ref 4.22–5.81)
RDW: 15.3 % (ref 11.5–15.5)
WBC: 7 K/uL (ref 4.0–10.5)
nRBC: 0 % (ref 0.0–0.2)

## 2023-12-29 SURGERY — RIGHT HEART CATH
Anesthesia: LOCAL

## 2023-12-29 MED ORDER — FREE WATER
250.0000 mL | Freq: Once | Status: AC
  Administered 2023-12-29: 250 mL via ORAL

## 2023-12-29 MED ORDER — LIDOCAINE HCL (PF) 1 % IJ SOLN
INTRAMUSCULAR | Status: AC
  Filled 2023-12-29: qty 30

## 2023-12-29 MED ORDER — SODIUM CHLORIDE 0.9 % IV SOLN: 250.0000 mL | INTRAVENOUS | Status: DC | PRN

## 2023-12-29 MED ORDER — HYDRALAZINE HCL 20 MG/ML IJ SOLN
10.0000 mg | INTRAMUSCULAR | Status: DC | PRN
  Administered 2023-12-29: 10 mg via INTRAVENOUS
  Filled 2023-12-29: qty 1

## 2023-12-29 MED ORDER — HEPARIN (PORCINE) IN NACL 1000-0.9 UT/500ML-% IV SOLN
INTRAVENOUS | Status: DC | PRN
  Administered 2023-12-29: 500 mL

## 2023-12-29 MED ORDER — SODIUM CHLORIDE 0.9% FLUSH: 3.0000 mL | Freq: Two times a day (BID) | INTRAVENOUS | Status: DC

## 2023-12-29 MED ORDER — SODIUM CHLORIDE 0.9% FLUSH: 3.0000 mL | INTRAVENOUS | Status: DC | PRN

## 2023-12-29 MED ORDER — ACETAMINOPHEN 325 MG PO TABS: 650.0000 mg | ORAL_TABLET | ORAL | Status: DC | PRN

## 2023-12-29 MED ORDER — LABETALOL HCL 5 MG/ML IV SOLN: 10.0000 mg | INTRAVENOUS | Status: DC | PRN

## 2023-12-29 MED ORDER — FREE WATER: 250.0000 mL | Freq: Once | Status: DC

## 2023-12-29 MED ORDER — LIDOCAINE HCL (PF) 1 % IJ SOLN
INTRAMUSCULAR | Status: DC | PRN
  Administered 2023-12-29: 3 mL

## 2023-12-29 MED ORDER — ONDANSETRON HCL 4 MG/2ML IJ SOLN: 4.0000 mg | Freq: Four times a day (QID) | INTRAMUSCULAR | Status: DC | PRN

## 2023-12-29 SURGICAL SUPPLY — 6 items
CATH SWAN GANZ 7F STRAIGHT (CATHETERS) IMPLANT
GLIDESHEATH SLENDER 7FR .021G (SHEATH) IMPLANT
PACK CARDIAC CATHETERIZATION (CUSTOM PROCEDURE TRAY) ×1 IMPLANT
SHEATH PROBE COVER 6X72 (BAG) IMPLANT
TRANSDUCER W/STOPCOCK (MISCELLANEOUS) IMPLANT
TUBING ART PRESS 72 MALE/FEM (TUBING) IMPLANT

## 2023-12-29 NOTE — Discharge Instructions (Signed)

## 2023-12-29 NOTE — Progress Notes (Signed)
 Patient and patient mother given discharge instructions, education provided no further questions at this time. Patient able to ambulate and void before discharge. Able to tolerate PO intake. Patient site is clean, dry, intact with no hematoma noted upon discharge.

## 2023-12-29 NOTE — Interval H&P Note (Signed)
 History and Physical Interval Note:  12/29/2023 7:50 AM  [name]  has presented today for surgery, with the diagnosis of heart failure.  The various methods of treatment have been discussed with the patient and family. After consideration of risks, benefits and other options for treatment, the patient has consented to  Procedure(s): RIGHT HEART CATH (N/A) as a surgical intervention.  The patient's history has been reviewed, patient examined, no change in status, stable for surgery.  I have reviewed the patient's chart and labs.  Questions were answered to the patient's satisfaction.     Jalon Squier [employer]

## 2023-12-30 LAB — POCT I-STAT EG7
Acid-base deficit: 4 mmol/L — ABNORMAL HIGH (ref 0.0–2.0)
Acid-base deficit: 4 mmol/L — ABNORMAL HIGH (ref 0.0–2.0)
Bicarbonate: 20.5 mmol/L (ref 20.0–28.0)
Bicarbonate: 20.7 mmol/L (ref 20.0–28.0)
Calcium, Ion: 1.19 mmol/L (ref 1.15–1.40)
Calcium, Ion: 1.18 mmol/L (ref 1.15–1.40)
HCT: 33 % — ABNORMAL LOW (ref 39.0–52.0)
HCT: 33 % — ABNORMAL LOW (ref 39.0–52.0)
Hemoglobin: 11.2 g/dL — ABNORMAL LOW (ref 13.0–17.0)
Hemoglobin: 11.2 g/dL — ABNORMAL LOW (ref 13.0–17.0)
O2 Saturation: 67 %
O2 Saturation: 66 %
Potassium: 3.8 mmol/L (ref 3.5–5.1)
Potassium: 3.8 mmol/L (ref 3.5–5.1)
Sodium: 136 mmol/L (ref 135–145)
Sodium: 136 mmol/L (ref 135–145)
TCO2: 22 mmol/L (ref 22–32)
TCO2: 22 mmol/L (ref 22–32)
pCO2, Ven: 35.7 mmHg — ABNORMAL LOW (ref 44–60)
pCO2, Ven: 35.9 mmHg — ABNORMAL LOW (ref 44–60)
pH, Ven: 7.367 (ref 7.25–7.43)
pH, Ven: 7.37 (ref 7.25–7.43)
pO2, Ven: 35 mmHg (ref 32–45)
pO2, Ven: 35 mmHg (ref 32–45)

## 2023-12-31 NOTE — Telephone Encounter (Signed)
 LVM for return call.

## 2024-01-01 ENCOUNTER — Other Ambulatory Visit (HOSPITAL_COMMUNITY): Payer: Self-pay | Admitting: Family Medicine

## 2024-01-01 ENCOUNTER — Other Ambulatory Visit: Payer: Self-pay | Admitting: Family Medicine

## 2024-01-04 ENCOUNTER — Ambulatory Visit

## 2024-01-04 ENCOUNTER — Telehealth: Payer: Self-pay

## 2024-01-04 DIAGNOSIS — I5022 Chronic systolic (congestive) heart failure: Secondary | ICD-10-CM | POA: Diagnosis not present

## 2024-01-04 DIAGNOSIS — Z95810 Presence of automatic (implantable) cardiac defibrillator: Secondary | ICD-10-CM

## 2024-01-04 NOTE — Progress Notes (Signed)
 EPIC Encounter for ICM Monitoring  Patient Name: [name] is a 59 y.o. male Date: 01/04/2024 Primary Care Physican: Delbert Clam, MD Primary Cardiologist: Rolan Electrophysiologist: Inocencio 02/17/2022 Weight: 142 lbs 08/22/2022 Office Weight: 138 lbs  10/15/2022 Weight:  139 lbs 12/23/2022 Office Weight: 141 lbs 04/02/2023 Office Weight:  140 lbs 05/04/2023 Office Weight: 136 lbs   05/11/2023 Office Weight: 139 lbs     06/24/2023 Office Weight: 146 lbs     10/26/2023 Office Weight: 141 lbs    12/21/2023 Office Weight: 142 lbs    SVT Episode 60 NVST Episodes 30                                        Attempted call to patient and unable to reach. Transmission results reviewed.    Diet:  Does not follow low salt diet.   Since 11/23/2023 ICM Remote Transmission: Cor[employer]ue thoracic impedance suggesting possible fluid accumulation starting 01/02/2024.  Also suggesting possible fluid accumulation from  12/26/2023-12/30/2023.  Pt has history of SVT & NSVT episodes.     Prescribed:  Furosemide  20 mg take 4 tablets (80 mg total) by mouth daily.   Potassium 20 mEq take 3 tablet(s) (60 mEq total) by mouth daily. Spironolactone  25 mg take 1 tablet by mouth at bedtime    Labs: 12/29/2023 Potassium 3.8, Sodium 136 12/21/2023 Potassium 4.2 12/07/2023 Creatinine 1.19, BUN 8,   Potassium 3.2, Sodium 134, GFR >60 10/26/2023 Creatinine 1.27, BUN 21, Potassium 4.0, Sodium 133, GFR >60 08/11/2023 Creatinine 1.27, BUN 22, Potassium 4.1, Sodium 134  06/22/2023 Creatinine 1.02, BUN 10, Potassium 3.4, Sodium 137, GFR >60  05/11/2023 Creatinine 1.19, BUN 15, Potassium 4.0, Sodium 136, GFR >60  A complete set of results can be found in Results Review.   Recommendations: Unable to reach.     Follow-up plan: ICM clinic phone appointment on 01/19/2024 to recheck fluid levels (will be checked at 01/12/2024 HF clinic visit).    91 day device clinic remote transmission 02/08/2024.     EP/Cardiology Office  Visits:   01/12/2024 with HF Clinic.  01/27/2024 with Charlies Arthur, PA.   Recall 04/04/2024 with Jodie Passey, PA.     Copy of ICM check sent to Dr. Inocencio.    Remote monitoring is medically necessary for Heart Failure Management.    Daily Thoracic Impedance ICM trend: 10/06/2023 through 01/04/2024.    12-14 Month Thoracic Impedance ICM trend:     [name], RN 01/04/2024 3:01 PM

## 2024-01-04 NOTE — Telephone Encounter (Signed)
 Remote ICM transmission received.  Attempted call to patient regarding ICM remote transmission.  Left detailed message per DPR with ICM phone number to return call for any questions, concerns or fluid symptoms.

## 2024-01-11 ENCOUNTER — Telehealth: Payer: Self-pay

## 2024-01-11 ENCOUNTER — Encounter: Payer: Self-pay | Admitting: Cardiology

## 2024-01-11 NOTE — Telephone Encounter (Signed)
 Called to confirm/remind patient of their appointment at the [facility] on 01/12/24.   Appointment:   [x] Confirmed  [] Left mess   [] No answer/No voice mail  [] VM Full/unable to leave message  [] Phone not in service  Patient reminded to bring all medications and/or complete list.  Confirmed patient has transportation. Gave directions, instructed to utilize valet parking.

## 2024-01-12 ENCOUNTER — Ambulatory Visit (HOSPITAL_COMMUNITY): Payer: Self-pay | Admitting: Adult Health

## 2024-01-12 ENCOUNTER — Ambulatory Visit (HOSPITAL_COMMUNITY)
Admission: RE | Admit: 2024-01-12 | Discharge: 2024-01-12 | Disposition: A | Discharge status: Home / Self Care | Source: Ambulatory Visit | Attending: Adult Health | Admitting: Adult Health

## 2024-01-12 ENCOUNTER — Encounter (HOSPITAL_COMMUNITY): Payer: Self-pay

## 2024-01-12 VITALS — BP 96/72 | HR 101 | Ht 68.0 in | Wt 143.6 lb

## 2024-01-12 DIAGNOSIS — Z8249 Family history of ischemic heart disease and other diseases of the circulatory system: Secondary | ICD-10-CM | POA: Insufficient documentation

## 2024-01-12 DIAGNOSIS — Z7984 Long term (current) use of oral hypoglycemic drugs: Secondary | ICD-10-CM | POA: Diagnosis not present

## 2024-01-12 DIAGNOSIS — Z7951 Long term (current) use of inhaled steroids: Secondary | ICD-10-CM | POA: Diagnosis not present

## 2024-01-12 DIAGNOSIS — M109 Gout, unspecified: Secondary | ICD-10-CM | POA: Insufficient documentation

## 2024-01-12 DIAGNOSIS — Z79899 Other long term (current) drug therapy: Secondary | ICD-10-CM | POA: Insufficient documentation

## 2024-01-12 DIAGNOSIS — I4729 Other ventricular tachycardia: Secondary | ICD-10-CM | POA: Diagnosis not present

## 2024-01-12 DIAGNOSIS — I471 Supraventricular tachycardia, unspecified: Secondary | ICD-10-CM | POA: Diagnosis not present

## 2024-01-12 DIAGNOSIS — J449 Chronic obstructive pulmonary disease, unspecified: Secondary | ICD-10-CM | POA: Insufficient documentation

## 2024-01-12 DIAGNOSIS — F17210 Nicotine dependence, cigarettes, uncomplicated: Secondary | ICD-10-CM | POA: Diagnosis not present

## 2024-01-12 DIAGNOSIS — I5022 Chronic systolic (congestive) heart failure: Secondary | ICD-10-CM | POA: Insufficient documentation

## 2024-01-12 DIAGNOSIS — I472 Ventricular tachycardia, unspecified: Secondary | ICD-10-CM | POA: Insufficient documentation

## 2024-01-12 DIAGNOSIS — Z95810 Presence of automatic (implantable) cardiac defibrillator: Secondary | ICD-10-CM | POA: Diagnosis not present

## 2024-01-12 DIAGNOSIS — Z720 Tobacco use: Secondary | ICD-10-CM

## 2024-01-12 DIAGNOSIS — I110 Hypertensive heart disease with heart failure: Secondary | ICD-10-CM | POA: Diagnosis not present

## 2024-01-12 DIAGNOSIS — I5042 Chronic combined systolic (congestive) and diastolic (congestive) heart failure: Secondary | ICD-10-CM

## 2024-01-12 LAB — PRO BRAIN NATRIURETIC PEPTIDE: Pro Brain Natriuretic Peptide: 518 pg/mL — ABNORMAL HIGH

## 2024-01-12 LAB — BASIC METABOLIC PANEL WITH GFR
Anion gap: 14 (ref 5–15)
BUN: 10 mg/dL (ref 6–20)
CO2: 26 mmol/L (ref 22–32)
Calcium: 9.3 mg/dL (ref 8.9–10.3)
Chloride: 96 mmol/L — ABNORMAL LOW (ref 98–111)
Creatinine, Ser: 1.15 mg/dL (ref 0.61–1.24)
GFR, Estimated: >60 mL/min
Glucose, Bld: 63 mg/dL — ABNORMAL LOW (ref 70–99)
Potassium: 3.5 mmol/L (ref 3.5–5.1)
Sodium: 136 mmol/L (ref 135–145)

## 2024-01-12 LAB — MAGNESIUM: Magnesium: 1.2 mg/dL — ABNORMAL LOW (ref 1.7–2.4)

## 2024-01-12 NOTE — Progress Notes (Signed)
 "   [facility] Note    PCP: Delbert Clam, MD HF Cardiology: Dr. Rolan  58 y.o. with history of nonischemic cardiomyopathy was referred by Rosaline Bane for evaluation of CHF.  Cardiomyopathy has been diagnosed since 2016 when echo showed EF 20%.  He has a history of cocaine use and ETOH abuse.  He is smoking.  No recent cocaine.  He had a stroke in 2017 in setting of uncontrolled HTN and cocaine abuse.  He has a [employer] School [employer] ICD.  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  Last echo in 2/23 showed EF < 20%, severe LV dilation, moderately decreased RV systolic function with mild RV enlargement. He has 13 siblings, none of whom have known cardiac disease.  His grandmother had CHF and there apparently was conversation about a heart transplant but she passed away.  CPX November 06, 2023) showed severe functional limitation due to HF and deconditioning.   Cardiac MRI 10/23 (difficult images due to ICD artifact) LVEF 24%, RVEF 34%.  Echo 9/24 showed EF 25%, RV mildly reduced, prominent LV apical trabeculation  Last seen in the HF clinic 12/07/2023. Entresto  stopped and losartan  was started. He was set up for RHC. He had RHC 12/29/2023. Preserved cardiac output and normal filling pressures.   Today he returns for HF follow up. Complaining of fatigue and leg cramps. He tells me his PCP put him on a muscle relaxor. He stopped taking medications a few days ago due to cramps. SOB with exertion. Poor appetite.  No fever or chills. Weight at home  pounds. He has not taken his medications in the last 2 days. Smoking 9 cigarettes per day.  I reviewed his medication list from [facility]. He has been taking losartan  and entresto .   St Jude device interrogation:  NSVT /SVT  Has had 17 epsiodes of NSVT. Imedance well above threshold. Not suggestive of fluid accumulation.   Labs (3/24): K 3.6, creatinine 0.96 Labs (4/24): BNP 488, K 3.6, creatinine 1.11 Labs (8/24): K 3.7, creatinine  1.28 Labs (12/24): K 4.4, creatinine 1.15 Labs (4/25): K 4.0, creatinine 1.19, LDL 33 Labs (7/25): K 4.1, creatinine 1.29, hgb 11.9 Labs (10/25): K 4.0, creatinine 1.27 Labs (12/07/2023): Dig < 0.6, Mag 1.6, K 3.2   PMH: 1. H/o seizure disorder: ?ETOH. 2. HTN 3. 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  4. H/o SVT 5. OSA 6. COPD: Active smoker - CT chest in 3/25 with mild emphysema 7. VT: St Jude ICD.  ATP 6/23.  8. Chronic systolic CHF: Nonischemic cardiomyopathy.  Documented since at least 2016. St Jude ICD.  - Echo (3/16): EF 20% - Echo (2/23): EF < 20%, severe LV dilation, moderately decreased RV systolic function with mild RV enlargement.  - Coronary CTA (6/23): 81st percentile calcium  score, mild nonobstructive CAD.  - CPX 11-06-2023): Peak VO2: 14.3 (43% predicted peak VO2), VE/VCO2 slope: 39, OUES: 1.10, Peak RER: 0.70  - Cardiac MRI 10/23 (difficult images due to ICD artifact) LVEF 24%, RVEF 34%. - Echo (9/24): EF 25%, RV mildly reduced, prominent LV apical trabeculation -12/2023- RHC 12/29/2023- preserved cardiac ouput and normal filling pressures.  9. Prior h/o cocaine 10. Gout  Social History   Socioeconomic History   Marital status: Single    Spouse name: Not on file   Number of children: 1   Years of education: 12   Highest education level: Not on file  Occupational History   Not on file  Tobacco Use   Smoking  status: Every Day    Current packs/day: 0.50    Average packs/day: 0.5 packs/day for 20.0 years (10.0 ttl pk-yrs)    Types: Cigarettes   Smokeless tobacco: Never   Tobacco comments:    02/25/22 down to 1/2 pack a day  Vaping Use   Vaping status: Never Used  Substance and Sexual Activity   Alcohol use: Yes    Alcohol/week: 1.0 standard drink of alcohol    Types: 1 Cans of beer per week    Comment: socially    Drug use: Not Currently    Types: Cocaine    Comment: stopped using cocaine 11/19/14   Sexual activity: Not on file  Other Topics Concern    Not on file  Social History Narrative   Lives alone, nurse comes 2 hrs daily   Social Drivers of Health   Tobacco Use: High Risk (01/12/2024)   Patient History    Smoking Tobacco Use: Every Day    Smokeless Tobacco Use: Never    Passive Exposure: Not on file  Financial Resource Strain: Low Risk (03/25/2023)   Overall Financial Resource Strain (CARDIA)    Difficulty of Paying Living Expenses: Not very hard  Food Insecurity: No Food Insecurity (03/25/2023)   Hunger Vital Sign    Worried About Running Out of Food in the Last Year: Never true    Ran Out of Food in the Last Year: Never true  Transportation Needs: No Transportation Needs (03/25/2023)   PRAPARE - Administrator, Civil Service (Medical): No    Lack of Transportation (Non-Medical): No  Physical Activity: Inactive (03/25/2023)   Exercise Vital Sign    Days of Exercise per Week: 0 days    Minutes of Exercise per Session: 0 min  Stress: No Stress Concern Present (03/25/2023)   [employer] of Occupational Health - Occupational Stress Questionnaire    Feeling of Stress : Not at all  Social Connections: Moderately Integrated (03/25/2023)   Social Connection and Isolation Panel    Frequency of Communication with Friends and Family: More than three times a week    Frequency of Social Gatherings with Friends and Family: More than three times a week    Attends Religious Services: More than 4 times per year    Active Member of Clubs or Organizations: Yes    Attends Banker Meetings: Never    Marital Status: Never married  Intimate Partner Violence: Not At Risk (03/25/2023)   Humiliation, Afraid, Rape, and Kick questionnaire    Fear of Current or Ex-Partner: No    Emotionally Abused: No    Physically Abused: No    Sexually Abused: No  Depression (PHQ2-9): Medium Risk (12/21/2023)   Depression (PHQ2-9)    PHQ-2 Score: 8  Alcohol Screen: Medium Risk (03/25/2023)   Alcohol Screen    Last Alcohol Screening  Score (AUDIT): 8  Housing: Unknown (03/25/2023)   Housing Stability Vital Sign    Unable to Pay for Housing in the Last Year: No    Number of Times Moved in the Last Year: Not on file    Homeless in the Last Year: No  Utilities: Not At Risk (12/24/2021)   AHC Utilities    Threatened with loss of utilities: No  Health Literacy: Adequate Health Literacy (03/25/2023)   B1300 Health Literacy    Frequency of need for help with medical instructions: Never   Family History  Problem Relation Age of Onset   Hypertension Mother    Hyperlipidemia  Mother    Hypertension Father    Hypertension Sister    Hypertension Brother    Stroke Maternal Aunt    Colon cancer Neg Hx    Esophageal cancer Neg Hx    ROS: All systems reviewed and negative except as per HPI.   Current Outpatient Medications  Medication Sig Dispense Refill   ADVAIR  DISKUS 250-50 MCG/ACT AEPB Inhale 1 puff into the lungs 2 (two) times daily. in the morning and at bedtime. 180 each 1   albuterol  (PROAIR  HFA) 108 (90 Base) MCG/ACT inhaler Inhale 2 puffs into the lungs every 6 (six) hours as needed for shortness of breath. 18 g 6   allopurinol  (ZYLOPRIM ) 100 MG tablet TAKE 1 TABLET (100 MG TOTAL) BY MOUTH DAILY (AM) 30 tablet 2   aspirin  81 MG EC tablet Take 81 mg by mouth in the morning. Swallow whole.     atorvastatin  (LIPITOR) 40 MG tablet TAKE 1 TABLET (40 MG TOTAL) BY MOUTH DAILY (AM) 90 tablet 1   buPROPion  (WELLBUTRIN  SR) 150 MG 12 hr tablet Take 150 mg by mouth 2 (two) times daily.     colchicine  0.6 MG tablet TAKE 1 TABLET (0.6 MG TOTAL) BY MOUTH DAILY AS NEEDED (GOUT). 15 tablet 1   cyclobenzaprine  (FLEXERIL ) 10 MG tablet TAKE 1 TABLET (10 MG TOTAL) BY MOUTH AT BEDTIME AS NEEDED FOR MUSCLE SPASMS. 30 tablet 1   dapagliflozin  propanediol (FARXIGA ) 10 MG TABS tablet TAKE 1 TABLET (10 MG TOTAL) BY MOUTH DAILY BEFORE BREAKFAST. (AM) 30 tablet 11   digoxin  (LANOXIN ) 0.125 MG tablet TAKE 1 TABLET (0.125 MG TOTAL) BY MOUTH DAILY.  (AM) 90 tablet 3   furosemide  (LASIX ) 20 MG tablet Take 4 tablets (80 mg total) by mouth daily. 120 tablet 11   losartan  (COZAAR ) 25 MG tablet Take 1 tablet (25 mg total) by mouth daily. 90 tablet 3   magnesium  oxide (MAG-OX) 400 (240 Mg) MG tablet Take 1 tablet (400 mg total) by mouth 2 (two) times daily. 180 tablet 1   metoprolol  succinate (TOPROL  XL) 100 MG 24 hr tablet Take 1 tablet (100 mg total) by mouth daily.     mirtazapine  (REMERON  SOL-TAB) 15 MG disintegrating tablet Take 1 tablet (15 mg total) by mouth at bedtime. 90 tablet 1   potassium chloride  SA (KLOR-CON  M20) 20 MEQ tablet Take 3 tablets (60 mEq total) by mouth daily. 90 tablet 3   sacubitril -valsartan  (ENTRESTO ) 24-26 MG Take 1 tablet by mouth 2 (two) times daily.     SPIRIVA  HANDIHALER 18 MCG CAPS Place 1 capsule into inhaler and inhale daily.     spironolactone  (ALDACTONE ) 25 MG tablet TAKE 1 TABLET (25 MG TOTAL) BY MOUTH AT BEDTIME (PM) 90 tablet 3   No current facility-administered medications for this encounter.   Wt Readings from Last 3 Encounters:  01/12/24 65.1 kg (143 lb 9.6 oz)  12/29/23 65.3 kg (144 lb)  12/21/23 64.7 kg (142 lb 9.6 oz)   BP 96/72   Pulse (!) 101   Ht 5' 8 (1.727 m)   Wt 65.1 kg (143 lb 9.6 oz)   SpO2 98%   BMI 21.83 kg/m  General:   No resp difficulty Neck: no JVD.  Cor: Regular rate & rhythm.  Lungs: clear Abdomen: soft, nontender, nondistended.  Extremities: no  edema Neuro: alert & oriented x3  EKG: Sinus Tach 102 bpm QRS 118 ms  .  Assessment/Plan: 1. Chronic systolic CHF: Nonischemic cardiomyopathy.  Coronary CTA in 6/23 with  mild nonobstructive CAD.  Cause of CMP uncertain => prior cocaine abuse but not currently.  He drinks about 3 beers/day, denies heavier drinking in past but apparently there was concern that he had had ETOH withdrawal seizures in the past.  His grandmother had CHF but apparently none of his 13 siblings have known cardiac disease. EF has been low since at  least 2016.  Echo in 2/23 showed EF < 20%, severe LV dilation, moderately decreased RV systolic function with mild RV enlargement. He has a [employer] ICD.  CPX 9/23 showed severe functional limitation from both HF and deconditioning. Cardiac MRI 10/23, technically difficult study due to ICD artifact, LVEF 24%, no evidence of infiltrative disease. Echo 9/24 showed EF 25%.  Had RHC 12/29/2023- preserved cardiac output and normal filling pressures.  NYHA III. Appears euvolemic. Corvue not suggestive of fluid accumulation.  He has not had any medications x 2 days. Need to restart once pill pack is corrected.  - Volume status stable.  He has been taking entresto  and losartan . I suspect his symptoms are related to this. Today I am stopping entresto . Continue losartan  25 mg daily. - Continue Farxiga  10 mg daily. - Continue digoxin  0.125 mg daily.  - Continue Toprol  XL 100 mg daily. - Continue spironolactone  25 mg daily.   - Set up Echo . - Keep ETOH minimal. - 2. COPD/Tobacco Abuse  - Continues to smoke. Discussed cessation.  - Failed nicotine  patches.  - Failed Chantix  (stopped due to nightmares).  - Unable to tolerate Wellbutrin  so will discontinue this.  3. Gout: on allopurinol .   4. SVT/NSVT: Multiple episodes of NSVT SVT over the 48 hours. He has not had any medications over the last 2 days. Check BMET and Mag today.  Needs to restart medications.  -Needs follow up with EP.   Chedck BMET/Mag, BNP today .   [facility] was called with medication error. Instructed to remove entresto  and continue losartan . He is unsure what each tablet is for. We have instructed him to to go to [facility] with all pill packs and get the entresto  removed. I called and spoke with his mother on the phone and asked her to help get his medication pack to [facility]. She has agreed to do so.   Referred to HF Paramedicine to assist with medications and HF assessment and education. I received approval from  Tillman Aschoff ref  Follow up 2-3 weeks with his bubble packs. I spent 40 minutes reviewing records, interviewing/examining patient, and managing orders.      Greig Mosses, NP-C  01/12/2024 "

## 2024-01-12 NOTE — Patient Instructions (Addendum)
 Medication Changes:  YOUR PILL PACK NEEDS TO BE UPDATED TO TAKE OUT THE ENTRESTO -- DO NOT TAKE ANYMORE ENTRESTO    PLEASE GO TO [facility] TODAY AND TAKE YOUR BUBBLE PACK WITH YOUR MEDICATIONS TO HAVE THESE UPDATED  [facility] IS LOCATED AT AND THE PHONE NUMBER IS [phone] Address  [address]  [place] [address]   Lab Work:  Labs done today, your results will be available in MyChart, we will contact you for abnormal readings.  Referrals:  YOU HAVE BEEN REFERRED TO PARAMEDICINE THEY WILL REACH OUT TO YOU OR CALL TO ARRANGE THIS. PLEASE CALL US  WITH ANY CONCERNS   Follow-Up in: 2 WEEKS AS SCHEDULED   At the [facility], you and your health needs are our priority. We have a designated team specialized in the treatment of Heart Failure. This Care Team includes your primary Heart Failure Specialized Cardiologist (physician), Advanced Practice Providers (APPs- Physician Assistants and Nurse Practitioners), and Pharmacist who all work together to provide you with the care you need, when you need it.   You may see any of the following providers on your designated Care Team at your next follow up:  Dr. Toribio Fuel Dr. Ezra Shuck Dr. Odis Brownie Greig Mosses, NP Caffie Shed, [place] [facility] [place], [place] Beckey Coe, NP Jordan Lee, NP Tinnie Redman, PharmD   Please be sure to bring in all your medications bottles to every appointment.   Need to Contact Us :  If you have any questions or concerns before your next appointment please send us  a message through [place] or call our office at [phone].    TO LEAVE A MESSAGE FOR THE NURSE SELECT OPTION 2, PLEASE LEAVE A MESSAGE INCLUDING: YOUR NAME DATE OF BIRTH CALL BACK NUMBER REASON FOR CALL**this is important as we prioritize the call backs  YOU WILL RECEIVE A CALL BACK THE SAME DAY AS LONG AS YOU CALL BEFORE 4:00 PM

## 2024-01-13 ENCOUNTER — Other Ambulatory Visit (HOSPITAL_COMMUNITY): Payer: Self-pay

## 2024-01-18 ENCOUNTER — Telehealth (HOSPITAL_COMMUNITY): Payer: Self-pay

## 2024-01-18 NOTE — Telephone Encounter (Signed)
 New referral received and accepted.  Paramedic will reach out within 24 business hrs to set up visit.    [name], Paramedic 01/18/2024

## 2024-01-19 ENCOUNTER — Ambulatory Visit: Attending: Cardiology

## 2024-01-19 DIAGNOSIS — I5022 Chronic systolic (congestive) heart failure: Secondary | ICD-10-CM

## 2024-01-19 DIAGNOSIS — Z95810 Presence of automatic (implantable) cardiac defibrillator: Secondary | ICD-10-CM

## 2024-01-19 NOTE — Telephone Encounter (Signed)
 Spoke to Mr. Niazi and he asked for a home visit next week on Monday at 10am. I will follow up at that time. Call complete.    [name], EMT-Paramedic [phone] 01/19/2024

## 2024-01-19 NOTE — Progress Notes (Signed)
 EPIC Encounter for ICM Monitoring  Patient Name: [name] is a 58 y.o. male Date: 01/19/2024 Primary Care Physican: Delbert Clam, MD Primary Cardiologist: Rolan Electrophysiologist: Inocencio 02/17/2022 Weight: 142 lbs 08/22/2022 Office Weight: 138 lbs  10/15/2022 Weight:  139 lbs 12/23/2022 Office Weight: 141 lbs 04/02/2023 Office Weight:  140 lbs 05/04/2023 Office Weight: 136 lbs   05/11/2023 Office Weight: 139 lbs     06/24/2023 Office Weight: 146 lbs     10/26/2023 Office Weight: 141 lbs    12/21/2023 Office Weight: 142 lbs  01/12/2024 Office Weight: 143.9 lbs   SVT Episode 25 NVST Episodes  10                                       Transmission results reviewed.    Diet:  Does not follow low salt diet.   Since 01/04/2024 ICM Remote Transmission: CorVue thoracic impedance suggesting fluid levels returned to normal 01/12/2024.  Pt has history of SVT & NSVT episodes and has follow up appt 01/27/2024.SABRA     Prescribed:  [name] EMT Paramed program approved to assist with meds on 01/18/2024. Furosemide  20 mg take 4 tablets (80 mg total) by mouth daily.   Potassium 20 mEq take 3 tablet(s) (60 mEq total) by mouth daily. Spironolactone  25 mg take 1 tablet by mouth at bedtime    Labs: 01/12/2024 Creatinine 1.15, BUN 10, Potassium 3.5, Sodium 136, GFR >60, BNP 518 12/29/2023 Potassium 3.8, Sodium 136 12/21/2023 Potassium 4.2 12/07/2023 Creatinine 1.19, BUN 8,   Potassium 3.2, Sodium 134, GFR >60 10/26/2023 Creatinine 1.27, BUN 21, Potassium 4.0, Sodium 133, GFR >60 08/11/2023 Creatinine 1.27, BUN 22, Potassium 4.1, Sodium 134  06/22/2023 Creatinine 1.02, BUN 10, Potassium 3.4, Sodium 137, GFR >60  05/11/2023 Creatinine 1.19, BUN 15, Potassium 4.0, Sodium 136, GFR >60  A complete set of results can be found in Results Review.   Recommendations: No changes.     Follow-up plan: ICM clinic phone appointment on 02/16/2024.    91 day device clinic remote transmission 02/08/2024.      EP/Cardiology Office Visits:  01/28/2024 with HF Clinic.  01/27/2024 with Charlies Arthur, PA (for recurrent SVT).   Recall 04/04/2024 with Jodie Passey, PA.     Copy of ICM check sent to Dr. Inocencio.    Remote monitoring is medically necessary for Heart Failure Management.    Daily Thoracic Impedance ICM trend: 10/21/2023 through 01/19/2024.    12-14 Month Thoracic Impedance ICM trend:     [name], RN 01/19/2024 7:17 AM

## 2024-01-20 NOTE — Progress Notes (Signed)
 "   Advanced Heart Failure Clinic Note    PCP: Delbert Clam, MD HF Cardiology: Dr. Rolan  58 y.o. with history of nonischemic cardiomyopathy was referred by Rosaline Bane for evaluation of CHF.  Cardiomyopathy has been diagnosed since 2016 when echo showed EF 20%.  He has a history of cocaine use and ETOH abuse.  He is smoking.  No recent cocaine.  He had a stroke in 2017 in setting of uncontrolled HTN and cocaine abuse.  He has a [employer] ICD.  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  Last echo in 2/23 showed EF < 20%, severe LV dilation, moderately decreased RV systolic function with mild RV enlargement. He has 13 siblings, none of whom have known cardiac disease.  His grandmother had CHF and there apparently was conversation about a heart transplant but she passed away.  CPX 10/23/2024) showed severe functional limitation due to HF and deconditioning.   Cardiac MRI 10/23 (difficult images due to ICD artifact) LVEF 24%, RVEF 34%.  Echo 9/24 showed EF 25%, RV mildly reduced, prominent LV apical trabeculation  Last seen in the HF clinic 12/07/2023. Entresto  stopped and losartan  was started. He was set up for RHC. He had RHC 12/29/2023. Preserved cardiac output and normal filling pressures.   Today he returns for AHF follow up after arranging for bubble packs at his last visit and establishing with paramedicine. Overall feeling good. Denies palpitations, CP, dizziness, edema, or PND/Orthopnea. No SOB. Appetite ok. No fever or chills. Does not weight at home. Taking all medications since being in the bubble packsc. Denies illicit drug use.  Drinks a can of beer a day. Smokes 1/2 PPD.   St Jude device interrogation:  No NSVT / SVT. CorVue stable. (Personally reviewed)    PMH: 1. H/o seizure disorder: ?ETOH. 2. HTN 3. 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  4. H/o SVT 5. OSA 6. COPD: Active smoker - CT chest in 3/25 with mild emphysema 7. VT: St Jude ICD.  ATP 6/23.  8.  Chronic systolic CHF: Nonischemic cardiomyopathy.  Documented since at least 2016. St Jude ICD.  - Echo (3/16): EF 20% - Echo (2/23): EF < 20%, severe LV dilation, moderately decreased RV systolic function with mild RV enlargement.  - Coronary CTA (6/23): 81st percentile calcium  score, mild nonobstructive CAD.  - CPX 10-23-24): Peak VO2: 14.3 (43% predicted peak VO2), VE/VCO2 slope: 39, OUES: 1.10, Peak RER: 0.70  - Cardiac MRI 10/23 (difficult images due to ICD artifact) LVEF 24%, RVEF 34%. - Echo (9/24): EF 25%, RV mildly reduced, prominent LV apical trabeculation -12/2023- RHC 12/29/2023- preserved cardiac ouput and normal filling pressures.  9. Prior h/o cocaine 10. Gout  Social History   Socioeconomic History   Marital status: Single    Spouse name: Not on file   Number of children: 1   Years of education: 12   Highest education level: Not on file  Occupational History   Not on file  Tobacco Use   Smoking status: Every Day    Current packs/day: 0.50    Average packs/day: 0.5 packs/day for 20.0 years (10.0 ttl pk-yrs)    Types: Cigarettes   Smokeless tobacco: Never   Tobacco comments:    02/25/22 down to 1/2 pack a day  Vaping Use   Vaping status: Never Used  Substance and Sexual Activity   Alcohol use: Yes    Alcohol/week: 1.0 standard drink of alcohol    Types: 1 Cans of beer per  week    Comment: socially    Drug use: Not Currently    Types: Cocaine    Comment: stopped using cocaine 11/19/14   Sexual activity: Not on file  Other Topics Concern   Not on file  Social History Narrative   Lives alone, nurse comes 2 hrs daily   Social Drivers of Health   Tobacco Use: High Risk (01/12/2024)   Patient History    Smoking Tobacco Use: Every Day    Smokeless Tobacco Use: Never    Passive Exposure: Not on file  Financial Resource Strain: Low Risk (03/25/2023)   Overall Financial Resource Strain (CARDIA)    Difficulty of Paying Living Expenses: Not very hard  Food Insecurity:  No Food Insecurity (03/25/2023)   Hunger Vital Sign    Worried About Running Out of Food in the Last Year: Never true    Ran Out of Food in the Last Year: Never true  Transportation Needs: No Transportation Needs (03/25/2023)   PRAPARE - Administrator, Civil Service (Medical): No    Lack of Transportation (Non-Medical): No  Physical Activity: Inactive (03/25/2023)   Exercise Vital Sign    Days of Exercise per Week: 0 days    Minutes of Exercise per Session: 0 min  Stress: No Stress Concern Present (03/25/2023)   [employer] of Occupational Health - Occupational Stress Questionnaire    Feeling of Stress : Not at all  Social Connections: Moderately Integrated (03/25/2023)   Social Connection and Isolation Panel    Frequency of Communication with Friends and Family: More than three times a week    Frequency of Social Gatherings with Friends and Family: More than three times a week    Attends Religious Services: More than 4 times per year    Active Member of Clubs or Organizations: Yes    Attends Banker Meetings: Never    Marital Status: Never married  Intimate Partner Violence: Not At Risk (03/25/2023)   Humiliation, Afraid, Rape, and Kick questionnaire    Fear of Current or Ex-Partner: No    Emotionally Abused: No    Physically Abused: No    Sexually Abused: No  Depression (PHQ2-9): Medium Risk (12/21/2023)   Depression (PHQ2-9)    PHQ-2 Score: 8  Alcohol Screen: Medium Risk (03/25/2023)   Alcohol Screen    Last Alcohol Screening Score (AUDIT): 8  Housing: Unknown (03/25/2023)   Housing Stability Vital Sign    Unable to Pay for Housing in the Last Year: No    Number of Times Moved in the Last Year: Not on file    Homeless in the Last Year: No  Utilities: Not At Risk (12/24/2021)   AHC Utilities    Threatened with loss of utilities: No  Health Literacy: Adequate Health Literacy (03/25/2023)   B1300 Health Literacy    Frequency of need for help with medical  instructions: Never   Family History  Problem Relation Age of Onset   Hypertension Mother    Hyperlipidemia Mother    Hypertension Father    Hypertension Sister    Hypertension Brother    Stroke Maternal Aunt    Colon cancer Neg Hx    Esophageal cancer Neg Hx    ROS: All systems reviewed and negative except as per HPI.   Current Outpatient Medications  Medication Sig Dispense Refill   ADVAIR  DISKUS 250-50 MCG/ACT AEPB Inhale 1 puff into the lungs 2 (two) times daily. in the morning and at bedtime. 180  each 1   albuterol  (PROAIR  HFA) 108 (90 Base) MCG/ACT inhaler Inhale 2 puffs into the lungs every 6 (six) hours as needed for shortness of breath. 18 g 6   allopurinol  (ZYLOPRIM ) 100 MG tablet TAKE 1 TABLET (100 MG TOTAL) BY MOUTH DAILY (AM) 30 tablet 2   aspirin  EC 81 MG tablet Take 1 tablet (81 mg total) by mouth in the morning. Swallow whole. 90 tablet 1   atorvastatin  (LIPITOR) 40 MG tablet TAKE 1 TABLET (40 MG TOTAL) BY MOUTH DAILY (AM) 90 tablet 1   buPROPion  (WELLBUTRIN  SR) 150 MG 12 hr tablet Take 150 mg by mouth 2 (two) times daily.     colchicine  0.6 MG tablet TAKE 1 TABLET (0.6 MG TOTAL) BY MOUTH DAILY AS NEEDED (GOUT). 15 tablet 1   cyclobenzaprine  (FLEXERIL ) 10 MG tablet TAKE 1 TABLET (10 MG TOTAL) BY MOUTH AT BEDTIME AS NEEDED FOR MUSCLE SPASMS. 30 tablet 1   dapagliflozin  propanediol (FARXIGA ) 10 MG TABS tablet TAKE 1 TABLET (10 MG TOTAL) BY MOUTH DAILY BEFORE BREAKFAST. (AM) 30 tablet 11   digoxin  (LANOXIN ) 0.125 MG tablet TAKE 1 TABLET (0.125 MG TOTAL) BY MOUTH DAILY. (AM) 90 tablet 3   furosemide  (LASIX ) 20 MG tablet Take 4 tablets (80 mg total) by mouth daily. 120 tablet 11   losartan  (COZAAR ) 25 MG tablet Take 1 tablet (25 mg total) by mouth daily. 90 tablet 3   magnesium  oxide (MAG-OX) 400 (240 Mg) MG tablet Take 1 tablet (400 mg total) by mouth 2 (two) times daily. 180 tablet 1   metoprolol  succinate (TOPROL  XL) 100 MG 24 hr tablet Take 1 tablet (100 mg total) by  mouth daily.     mirtazapine  (REMERON  SOL-TAB) 15 MG disintegrating tablet Take 1 tablet (15 mg total) by mouth at bedtime. 90 tablet 1   potassium chloride  SA (KLOR-CON  M20) 20 MEQ tablet Take 3 tablets (60 mEq total) by mouth daily. 90 tablet 3   SPIRIVA  HANDIHALER 18 MCG CAPS Place 1 capsule into inhaler and inhale daily.     spironolactone  (ALDACTONE ) 25 MG tablet TAKE 1 TABLET (25 MG TOTAL) BY MOUTH AT BEDTIME (PM) 90 tablet 3   No current facility-administered medications for this encounter.   Wt Readings from Last 3 Encounters:  01/28/24 65.5 kg (144 lb 6.4 oz)  01/27/24 67.1 kg (148 lb)  01/27/24 63.6 kg (140 lb 3.2 oz)   BP 120/76   Pulse (!) 103   Wt 65.5 kg (144 lb 6.4 oz)   SpO2 98%   BMI 21.96 kg/m  General:  well appearing.  No respiratory difficulty. Walked into clinic.  Neck: JVD flat.  Cor: Regular rate & rhythm. No murmurs. Lungs: clear Extremities: no edema  Neuro: alert & oriented x 3. Affect pleasant.   EKG: Sinus Tach 102 bpm QRS 118 ms (Personally reviewed from 12/25)    Assessment/Plan: 1. Chronic systolic CHF: Nonischemic cardiomyopathy.  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  Cause of CMP uncertain => prior cocaine abuse but not currently.  He drinks about 3 beers/day, denies heavier drinking in past but apparently there was concern that he had had ETOH withdrawal seizures in the past.  His grandmother had CHF but apparently none of his 13 siblings have known cardiac disease. EF has been low since at least 2016.  Echo in 2/23 showed EF < 20%, severe LV dilation, moderately decreased RV systolic function with mild RV enlargement. He has a [employer] ICD.  CPX  9/23 showed severe functional limitation from both HF and deconditioning. Cardiac MRI 10/23, technically difficult study due to ICD artifact, LVEF 24%, no evidence of infiltrative disease. Echo 9/24 showed EF 25%.  Had RHC 12/29/2023- preserved cardiac output and normal filling pressures.  - NYHA  II-IIIa. Appears euvolemic. Corvue not suggestive of fluid accumulation.  - Continue losartan  25 mg daily. - Continue Farxiga  10 mg daily. - Continue digoxin  0.125 mg daily.  - Continue Toprol  XL 100 mg daily. Consider switching to coreg  for BID dosing for HR, BP soft last couple of times, will hold off today.  - Continue spironolactone  25 mg daily.   - Update echo.  - Keep ETOH minimal.  2. COPD/Tobacco Abuse  - Continues to smoke. Discussed cessation.  - Failed nicotine  patches.  - Failed Chantix  (stopped due to nightmares).  - Unable to tolerate Wellbutrin  so will discontinue this.  3. Gout: on allopurinol .   4. SVT/NSVT: None on device check.  - Saw EP yesterday  5. Cramping - Reports intermittent hand, arm and leg cramping - Check BMET, Mg today. Previously refused to take ordered Mg.   Reports improved compliance with bubble packs. Still needs to stop drinking ETOH and smoking.   Follow up in 4 weeks with Dr. Rolan + echo.    Beckey LITTIE Coe, AGACNP-BC  01/28/2024 "
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
## 2024-01-27 ENCOUNTER — Ambulatory Visit: Attending: Cardiovascular Disease | Admitting: Physician Assistant

## 2024-01-27 ENCOUNTER — Telehealth (HOSPITAL_COMMUNITY): Payer: Self-pay

## 2024-01-27 ENCOUNTER — Other Ambulatory Visit (HOSPITAL_COMMUNITY): Payer: Self-pay

## 2024-01-27 ENCOUNTER — Ambulatory Visit: Payer: Self-pay | Admitting: Cardiology

## 2024-01-27 VITALS — BP 96/63 | HR 88 | Ht 68.0 in | Wt 148.0 lb

## 2024-01-27 DIAGNOSIS — I428 Other cardiomyopathies: Secondary | ICD-10-CM | POA: Diagnosis present

## 2024-01-27 DIAGNOSIS — I5022 Chronic systolic (congestive) heart failure: Secondary | ICD-10-CM | POA: Diagnosis present

## 2024-01-27 DIAGNOSIS — Z95810 Presence of automatic (implantable) cardiac defibrillator: Secondary | ICD-10-CM | POA: Insufficient documentation

## 2024-01-27 DIAGNOSIS — I952 Hypotension due to drugs: Secondary | ICD-10-CM | POA: Diagnosis present

## 2024-01-27 LAB — CUP PACEART INCLINIC DEVICE CHECK
Battery Remaining Longevity: 49 mo
Brady Statistic RV Percent Paced: 0 %
Date Time Interrogation Session: 20260107163228
HighPow Impedance: 69.75 Ohm
Implantable Lead Connection Status: 753985
Implantable Lead Implant Date: 20191230
Implantable Lead Location: 753860
Implantable Lead Model: 7122
Implantable Pulse Generator Implant Date: 20191230
Lead Channel Impedance Value: 487.5 Ohm
Lead Channel Pacing Threshold Amplitude: 0.5 V
Lead Channel Pacing Threshold Amplitude: 0.5 V
Lead Channel Pacing Threshold Pulse Width: 0.5 ms
Lead Channel Pacing Threshold Pulse Width: 0.5 ms
Lead Channel Sensing Intrinsic Amplitude: 11.6 mV
Lead Channel Setting Pacing Amplitude: 2.5 V
Lead Channel Setting Pacing Pulse Width: 0.5 ms
Lead Channel Setting Sensing Sensitivity: 0.5 mV
Pulse Gen Serial Number: 9850980
Zone Setting Status: 755011

## 2024-01-27 NOTE — Progress Notes (Signed)
 Paramedicine Encounter    Patient ID: [name], male    DOB: October 04, 1965, 59 y.o.   MRN: [id number]   Complaints- reports of nausea and gagging hours or morning after consuming alcohol, continues to have cramps in legs and arms intermittently.   Assessment- CAOX4, warm and dry, ambulatory without shortness of breath, no lower leg edema, lungs clear, smell of alcohol on breath, vitals as noted, weight is 140.2lbs, admits to smoking cigarettes, says he has been clean off cocaine for 6 years, admits to continued use of alcohol   Compliance with meds- has been compliant since last clinic visit on 01/12/24 but prior to that he was not compliant with meds- had over 15 packs of bubble packs in the house he gave me to return with old doses of meds   Pill box filled- bubble packs confirmed ( all meds present except aspirin  and mag oxide)   Refills needed- mag oxide and aspirin    Meds changes since last visit- today is initial visit since re- referral     Social changes- now [place] he and his brother are renovating.    VISIT SUMMARY- Arrived for home visit for [name] who reports to be feeling okay today but says he has been having intermittent cramps in his hands and legs, he also says he has had nausea and gagging hours later after consuming alcohol. I educated him on importance of decreasing use of alcohol and provided resources for cessation. He has not had a endo or colonoscopy he says- I will ask PCP about GI referral. I obtained vitals and assessment. No swelling, lungs clear. Weight is stable. I reviewed meds in bubble packs- missing aspirin  and mag oxide. I will message PCP to get these in a prescription to be placed in bubble packs to help with compliance. We discussed med compliance and importance of same. We also reviewed appointments and confirmed same. He is able to set up his transportation on his own. He is now living in a two bedroom house that he and  his brother have been renovating that his brother bought- he says this is a much better living situation for him. He agreed to stay on track with meds and appointments and agrees with paramedicine plan. I will follow up after clinic visit tomorrow for any changes. Home visit complete.   BP 122/70   Pulse (!) 52   Resp 16   Wt 140 lb 3.2 oz (63.6 kg)   SpO2 98%   BMI 21.32 kg/m  Weight yesterday-- did not weigh  Last visit weight-- 143lbs      ACTION: Home visit completed     Patient Care Team: Delbert Clam, MD as PCP - General (Family Medicine) Raford Riggs, MD as PCP - Cardiology (Cardiology) Inocencio Soyla Lunger, MD as PCP - Electrophysiology (Cardiology) Rolan Ezra RAMAN, MD as PCP - Advanced Heart Failure (Cardiology) Raford Riggs, MD as Attending Physician (Cardiology)  Patient Active Problem List   Diagnosis Date Noted   Acute combined systolic and diastolic congestive heart failure (HCC)    AKI (acute kidney injury)    Acute exacerbation of CHF (congestive heart failure) (HCC) 03/05/2021   SVT (supraventricular tachycardia) 06/15/2020   Slurred speech 07/05/2019   ICD (implantable cardioverter-defibrillator) in place 07/20/2018   COPD (chronic obstructive pulmonary disease) (HCC) 12/09/2017   Obstructive sleep apnea 09/08/2016   Autonomic dysfunction 07/15/2015   DJD (degenerative joint disease), lumbar 06/19/2015   Tremor 05/14/2015  Abnormality of gait 05/14/2015   Neurogenic bladder 04/17/2015   Lumbar strain 03/23/2015   Leg weakness 03/23/2015   Major depression, chronic    History of stroke 02/26/2015   Benign essential HTN    ETOH abuse    Tobacco abuse    Chronic combined systolic and diastolic heart failure (HCC)    Alcoholic cardiomyopathy (HCC) 95/93/7983   Smoking greater than 20 pack years 04/19/2014   Polysubstance abuse (HCC) 03/22/2014   Current Medications[1] Allergies[2]   Social History   Socioeconomic History    Marital status: Single    Spouse name: Not on file   Number of children: 1   Years of education: 12   Highest education level: Not on file  Occupational History   Not on file  Tobacco Use   Smoking status: Every Day    Current packs/day: 0.50    Average packs/day: 0.5 packs/day for 20.0 years (10.0 ttl pk-yrs)    Types: Cigarettes   Smokeless tobacco: Never   Tobacco comments:    02/25/22 down to 1/2 pack a day  Vaping Use   Vaping status: Never Used  Substance and Sexual Activity   Alcohol use: Yes    Alcohol/week: 1.0 standard drink of alcohol    Types: 1 Cans of beer per week    Comment: socially    Drug use: Not Currently    Types: Cocaine    Comment: stopped using cocaine 11/19/14   Sexual activity: Not on file  Other Topics Concern   Not on file  Social History Narrative   Lives alone, nurse comes 2 hrs daily   Social Drivers of Health   Tobacco Use: High Risk (01/12/2024)   Patient History    Smoking Tobacco Use: Every Day    Smokeless Tobacco Use: Never    Passive Exposure: Not on file  Financial Resource Strain: Low Risk (03/25/2023)   Overall Financial Resource Strain (CARDIA)    Difficulty of Paying Living Expenses: Not very hard  Food Insecurity: No Food Insecurity (03/25/2023)   Hunger Vital Sign    Worried About Running Out of Food in the Last Year: Never true    Ran Out of Food in the Last Year: Never true  Transportation Needs: No Transportation Needs (03/25/2023)   PRAPARE - Administrator, Civil Service (Medical): No    Lack of Transportation (Non-Medical): No  Physical Activity: Inactive (03/25/2023)   Exercise Vital Sign    Days of Exercise per Week: 0 days    Minutes of Exercise per Session: 0 min  Stress: No Stress Concern Present (03/25/2023)   [employer] of Occupational Health - Occupational Stress Questionnaire    Feeling of Stress : Not at all  Social Connections: Moderately Integrated (03/25/2023)   Social Connection and  Isolation Panel    Frequency of Communication with Friends and Family: More than three times a week    Frequency of Social Gatherings with Friends and Family: More than three times a week    Attends Religious Services: More than 4 times per year    Active Member of [employer] or Organizations: Yes    Attends Banker Meetings: Never    Marital Status: Never married  Intimate Partner Violence: Not At Risk (03/25/2023)   Humiliation, Afraid, Rape, and Kick questionnaire    Fear of Current or Ex-Partner: No    Emotionally Abused: No    Physically Abused: No    Sexually Abused: No  Depression (PHQ2-9):  Medium Risk (12/21/2023)   Depression (PHQ2-9)    PHQ-2 Score: 8  Alcohol Screen: Medium Risk (03/25/2023)   Alcohol Screen    Last Alcohol Screening Score (AUDIT): 8  Housing: Unknown (03/25/2023)   Housing Stability Vital Sign    Unable to Pay for Housing in the Last Year: No    Number of Times Moved in the Last Year: Not on file    Homeless in the Last Year: No  Utilities: Not At Risk (12/24/2021)   AHC Utilities    Threatened with loss of utilities: No  Health Literacy: Adequate Health Literacy (03/25/2023)   B1300 Health Literacy    Frequency of need for help with medical instructions: Never    Physical Exam      Future Appointments  Date Time Provider Department Center  01/27/2024  1:55 PM Leverne Charlies Helling, PA-C [facility]  01/28/2024 12:00 PM [facility] PA/NP [facility] None  02/08/2024  7:25 AM [facility] DEVICE REMOTES [facility]  02/16/2024  7:05 AM [facility] DEVICE REMOTES [facility]  05/09/2024  7:25 AM [facility] HVT DEVICE REMOTES [facility]  06/20/2024  1:50 PM Delbert Clam, MD [facility] [address]                 [1]  Current Outpatient Medications:    ADVAIR  DISKUS 250-50 MCG/ACT AEPB, Inhale 1 puff into the lungs 2 (two) times daily. in the morning and at bedtime., Disp: 180 each, Rfl: 1   albuterol  (PROAIR  HFA) 108 (90 Base) MCG/ACT inhaler, Inhale  2 puffs into the lungs every 6 (six) hours as needed for shortness of breath., Disp: 18 g, Rfl: 6   allopurinol  (ZYLOPRIM ) 100 MG tablet, TAKE 1 TABLET (100 MG TOTAL) BY MOUTH DAILY (AM), Disp: 30 tablet, Rfl: 2   atorvastatin  (LIPITOR) 40 MG tablet, TAKE 1 TABLET (40 MG TOTAL) BY MOUTH DAILY (AM), Disp: 90 tablet, Rfl: 1   dapagliflozin  propanediol (FARXIGA ) 10 MG TABS tablet, TAKE 1 TABLET (10 MG TOTAL) BY MOUTH DAILY BEFORE BREAKFAST. (AM), Disp: 30 tablet, Rfl: 11   digoxin  (LANOXIN ) 0.125 MG tablet, TAKE 1 TABLET (0.125 MG TOTAL) BY MOUTH DAILY. (AM), Disp: 90 tablet, Rfl: 3   furosemide  (LASIX ) 20 MG tablet, Take 4 tablets (80 mg total) by mouth daily., Disp: 120 tablet, Rfl: 11   losartan  (COZAAR ) 25 MG tablet, Take 1 tablet (25 mg total) by mouth daily., Disp: 90 tablet, Rfl: 3   metoprolol  succinate (TOPROL  XL) 100 MG 24 hr tablet, Take 1 tablet (100 mg total) by mouth daily., Disp: , Rfl:    mirtazapine  (REMERON  SOL-TAB) 15 MG disintegrating tablet, Take 1 tablet (15 mg total) by mouth at bedtime., Disp: 90 tablet, Rfl: 1   potassium chloride  SA (KLOR-CON  M20) 20 MEQ tablet, Take 3 tablets (60 mEq total) by mouth daily., Disp: 90 tablet, Rfl: 3   SPIRIVA  HANDIHALER 18 MCG CAPS, Place 1 capsule into inhaler and inhale daily., Disp: , Rfl:    spironolactone  (ALDACTONE ) 25 MG tablet, TAKE 1 TABLET (25 MG TOTAL) BY MOUTH AT BEDTIME (PM), Disp: 90 tablet, Rfl: 3   aspirin  81 MG EC tablet, Take 81 mg by mouth in the morning. Swallow whole. (Patient not taking: Reported on 01/27/2024), Disp: , Rfl:    buPROPion  (WELLBUTRIN  SR) 150 MG 12 hr tablet, Take 150 mg by mouth 2 (two) times daily. (Patient not taking: Reported on 01/27/2024), Disp: , Rfl:    colchicine  0.6 MG tablet, TAKE 1 TABLET (0.6 MG  TOTAL) BY MOUTH DAILY AS NEEDED (GOUT). (Patient not taking: Reported on 01/27/2024), Disp: 15 tablet, Rfl: 1   cyclobenzaprine  (FLEXERIL ) 10 MG tablet, TAKE 1 TABLET (10 MG TOTAL) BY MOUTH AT BEDTIME AS NEEDED  FOR MUSCLE SPASMS., Disp: 30 tablet, Rfl: 1   magnesium  oxide (MAG-OX) 400 (240 Mg) MG tablet, Take 1 tablet (400 mg total) by mouth 2 (two) times daily. (Patient not taking: Reported on 01/27/2024), Disp: 180 tablet, Rfl: 1 [2]  Allergies Allergen Reactions   Penicillins Other (See Comments)    Blisters

## 2024-01-27 NOTE — Patient Instructions (Signed)
 Medication Instructions:   Your physician recommends that you continue on your current medications as directed. Please refer to the Current Medication list given to you today.  *If you need a refill on your cardiac medications before your next appointment, please call your pharmacy*  Lab Work:  NONE ORDERED  TODAY   If you have labs (blood work) drawn today and your tests are completely normal, you will receive your results only by: MyChart Message (if you have MyChart) OR A paper copy in the mail If you have any lab test that is abnormal or we need to change your treatment, we will call you to review the results.  Testing/Procedures: NONE ORDERED  TODAY    Follow-Up: At [facility], you and your health needs are our priority.  As part of our continuing mission to provide you with exceptional heart care, our providers are all part of one team.  This team includes your primary Cardiologist (physician) and Advanced Practice Providers or APPs (Physician Assistants and Nurse Practitioners) who all work together to provide you with the care you need, when you need it.  Your next appointment:    6 month(s)   Provider:   You may see Will Cortland Ding, MD or one of the following Advanced Practice Providers on your designated Care Team:   Mertha Abrahams, [place]    We recommend signing up for the patient portal called "MyChart".  Sign up information is provided on this After Visit Summary.  MyChart is used to connect with patients for Virtual Visits (Telemedicine).  Patients are able to view lab/test results, encounter notes, upcoming appointments, etc.  Non-urgent messages can be sent to your provider as well.   To learn more about what you can do with MyChart, go to [email or web address].   Other Instructions

## 2024-01-27 NOTE — Telephone Encounter (Signed)
 Called to confirm/remind patient of their appointment at the [facility] on 1/826.   Appointment:   [x] Confirmed  [] Left mess   [] No answer/No voice mail  [] VM Full/unable to leave message  [] Phone not in service  Patient reminded to bring all medications and/or complete list.  Confirmed patient has transportation. Gave directions, instructed to utilize valet parking.

## 2024-01-28 ENCOUNTER — Encounter (HOSPITAL_COMMUNITY): Payer: Self-pay

## 2024-01-28 ENCOUNTER — Ambulatory Visit (HOSPITAL_COMMUNITY)
Admission: RE | Admit: 2024-01-28 | Discharge: 2024-01-28 | Disposition: A | Discharge status: Home / Self Care | Source: Ambulatory Visit | Attending: Internal Medicine | Admitting: Internal Medicine

## 2024-01-28 ENCOUNTER — Ambulatory Visit (HOSPITAL_COMMUNITY): Payer: Self-pay | Admitting: Internal Medicine

## 2024-01-28 ENCOUNTER — Other Ambulatory Visit: Payer: Self-pay | Admitting: Pharmacist

## 2024-01-28 ENCOUNTER — Telehealth (HOSPITAL_COMMUNITY): Payer: Self-pay

## 2024-01-28 VITALS — BP 120/76 | HR 103 | Wt 144.4 lb

## 2024-01-28 DIAGNOSIS — I428 Other cardiomyopathies: Secondary | ICD-10-CM | POA: Insufficient documentation

## 2024-01-28 DIAGNOSIS — R252 Cramp and spasm: Secondary | ICD-10-CM | POA: Insufficient documentation

## 2024-01-28 DIAGNOSIS — I2510 Atherosclerotic heart disease of native coronary artery without angina pectoris: Secondary | ICD-10-CM | POA: Insufficient documentation

## 2024-01-28 DIAGNOSIS — F1491 Cocaine use, unspecified, in remission: Secondary | ICD-10-CM | POA: Diagnosis not present

## 2024-01-28 DIAGNOSIS — I110 Hypertensive heart disease with heart failure: Secondary | ICD-10-CM | POA: Diagnosis not present

## 2024-01-28 DIAGNOSIS — I471 Supraventricular tachycardia, unspecified: Secondary | ICD-10-CM | POA: Diagnosis not present

## 2024-01-28 DIAGNOSIS — Z8673 Personal history of transient ischemic attack (TIA), and cerebral infarction without residual deficits: Secondary | ICD-10-CM | POA: Diagnosis not present

## 2024-01-28 DIAGNOSIS — Z720 Tobacco use: Secondary | ICD-10-CM | POA: Diagnosis not present

## 2024-01-28 DIAGNOSIS — Z7984 Long term (current) use of oral hypoglycemic drugs: Secondary | ICD-10-CM | POA: Insufficient documentation

## 2024-01-28 DIAGNOSIS — I4729 Other ventricular tachycardia: Secondary | ICD-10-CM | POA: Diagnosis not present

## 2024-01-28 DIAGNOSIS — Z7982 Long term (current) use of aspirin: Secondary | ICD-10-CM | POA: Insufficient documentation

## 2024-01-28 DIAGNOSIS — Z95810 Presence of automatic (implantable) cardiac defibrillator: Secondary | ICD-10-CM | POA: Diagnosis not present

## 2024-01-28 DIAGNOSIS — I5022 Chronic systolic (congestive) heart failure: Secondary | ICD-10-CM | POA: Insufficient documentation

## 2024-01-28 DIAGNOSIS — Z79899 Other long term (current) drug therapy: Secondary | ICD-10-CM | POA: Diagnosis not present

## 2024-01-28 DIAGNOSIS — I5042 Chronic combined systolic (congestive) and diastolic (congestive) heart failure: Secondary | ICD-10-CM | POA: Diagnosis present

## 2024-01-28 DIAGNOSIS — F1010 Alcohol abuse, uncomplicated: Secondary | ICD-10-CM | POA: Diagnosis not present

## 2024-01-28 DIAGNOSIS — M109 Gout, unspecified: Secondary | ICD-10-CM | POA: Diagnosis not present

## 2024-01-28 DIAGNOSIS — F17210 Nicotine dependence, cigarettes, uncomplicated: Secondary | ICD-10-CM | POA: Insufficient documentation

## 2024-01-28 DIAGNOSIS — J449 Chronic obstructive pulmonary disease, unspecified: Secondary | ICD-10-CM | POA: Insufficient documentation

## 2024-01-28 LAB — BASIC METABOLIC PANEL WITH GFR
Anion gap: 13 (ref 5–15)
BUN: 13 mg/dL (ref 6–20)
CO2: 28 mmol/L (ref 22–32)
Calcium: 9 mg/dL (ref 8.9–10.3)
Chloride: 100 mmol/L (ref 98–111)
Creatinine, Ser: 0.99 mg/dL (ref 0.61–1.24)
GFR, Estimated: >60 mL/min
Glucose, Bld: 83 mg/dL (ref 70–99)
Potassium: 3.7 mmol/L (ref 3.5–5.1)
Sodium: 140 mmol/L (ref 135–145)

## 2024-01-28 LAB — MAGNESIUM: Magnesium: 1.1 mg/dL — ABNORMAL LOW (ref 1.7–2.4)

## 2024-01-28 LAB — PRO BRAIN NATRIURETIC PEPTIDE: Pro Brain Natriuretic Peptide: 758 pg/mL — ABNORMAL HIGH

## 2024-01-28 MED ORDER — ASPIRIN 81 MG PO TBEC: 81.0000 mg | DELAYED_RELEASE_TABLET | Freq: Every morning | ORAL | 1 refills | Status: AC

## 2024-01-28 MED ORDER — MAGNESIUM OXIDE -MG SUPPLEMENT 400 (240 MG) MG PO TABS: 400.0000 mg | ORAL_TABLET | Freq: Two times a day (BID) | ORAL | 1 refills | Status: AC

## 2024-01-28 NOTE — Telephone Encounter (Addendum)
" °  Pt aware, agreeable, and verbalized understanding Informed patient it is an OTC medication and he needs to go get some today, as if it drops any lower he could go into a dangerous heart rhythm.   ----- Message from Beckey Coe, NP sent at 01/28/2024  4:21 PM EST ----- Mg low at 1.1. Please call and make sure he's taking his Mag Ox, previously had refused since he had to pay OOP as it is an OTC drug. Imperative to take. This is why he's cramping in his arms and  legs. Needs to take!! "

## 2024-01-28 NOTE — Telephone Encounter (Signed)
 [name] is needing prescriptions for   Aspirin  81mg    Magnesium  Oxide 400mg  BID   He needs RX sent to [facility] so these can be placed in his bubble packs to improve compliance if possible.    Thanks so much!    [name], EMT-Paramedic [phone] 01/28/2024

## 2024-01-28 NOTE — Patient Instructions (Signed)
 There has been no changes to your medications.  Labs done today, your results will be available in MyChart, we will contact you for abnormal readings.  Your physician has requested that you have an echocardiogram. Echocardiography is a painless test that uses sound waves to create images of your heart. It provides your doctor with information about the size and shape of your heart and how well your hearts chambers and valves are working. This procedure takes approximately one hour. There are no restrictions for this procedure. Please do NOT wear cologne, perfume, aftershave, or lotions (deodorant is allowed). Please arrive 15 minutes prior to your appointment time.  Please note: We ask at that you not bring children with you during ultrasound (echo/ vascular) testing. Due to room size and safety concerns, children are not allowed in the ultrasound rooms during exams. Our front office staff cannot provide observation of children in our lobby area while testing is being conducted. An adult accompanying a patient to their appointment will only be allowed in the ultrasound room at the discretion of the ultrasound technician under special circumstances. We apologize for any inconvenience.  Your physician recommends that you schedule a follow-up appointment in: 1 month.  If you have any questions or concerns before your next appointment please send us  a message through [place] or call our office at [phone].    TO LEAVE A MESSAGE FOR THE NURSE SELECT OPTION 2, PLEASE LEAVE A MESSAGE INCLUDING: YOUR NAME DATE OF BIRTH CALL BACK NUMBER REASON FOR CALL**this is important as we prioritize the call backs  YOU WILL RECEIVE A CALL BACK THE SAME DAY AS LONG AS YOU CALL BEFORE 4:00 PM  At the [facility], you and your health needs are our priority. As part of our continuing mission to provide you with exceptional heart care, we have created designated Provider Care Teams. These Care  Teams include your primary Cardiologist (physician) and Advanced Practice Providers (APPs- Physician Assistants and Nurse Practitioners) who all work together to provide you with the care you need, when you need it.   You may see any of the following providers on your designated Care Team at your next follow up: Dr Toribio Fuel Dr Ezra Shuck Dr. Morene Brownie Greig Mosses, NP Caffie Shed, [place] [facility] [place], [place] Beckey Coe, NP Jordan Lee, NP Ellouise Class, NP Tinnie Redman, PharmD Jaun Bash, PharmD   Please be sure to bring in all your medications bottles to every appointment.    Thank you for choosing [facility]

## 2024-02-01 ENCOUNTER — Telehealth (HOSPITAL_COMMUNITY): Payer: Self-pay

## 2024-02-01 NOTE — Telephone Encounter (Signed)
 [name] did get Magnesium  over the counter over the weekend and has been taking it.   He also has an ADVERTISING ACCOUNT EXECUTIVE at [employer] and I plan to take his bubble packs back for them to placed inside for improvement on compliance today.    [name], EMT-Paramedic [phone] 02/01/2024

## 2024-02-03 ENCOUNTER — Ambulatory Visit: Admitting: Family Medicine

## 2024-02-05 ENCOUNTER — Encounter: Payer: Self-pay | Admitting: Adult Health

## 2024-02-08 ENCOUNTER — Ambulatory Visit: Payer: Medicaid Other

## 2024-02-08 DIAGNOSIS — I5022 Chronic systolic (congestive) heart failure: Secondary | ICD-10-CM | POA: Diagnosis not present

## 2024-02-09 ENCOUNTER — Encounter: Payer: Self-pay | Admitting: Internal Medicine

## 2024-02-09 LAB — CUP PACEART REMOTE DEVICE CHECK
Battery Remaining Longevity: 48 mo
Battery Remaining Percentage: 48 %
Battery Voltage: 2.95 V
Brady Statistic RV Percent Paced: 0 %
Date Time Interrogation Session: 20260119035445
HighPow Impedance: 61 Ohm
HighPow Impedance: 61 Ohm
Implantable Lead Connection Status: 753985
Implantable Lead Implant Date: 20191230
Implantable Lead Location: 753860
Implantable Lead Model: 7122
Implantable Pulse Generator Implant Date: 20191230
Lead Channel Impedance Value: 440 Ohm
Lead Channel Pacing Threshold Amplitude: 0.5 V
Lead Channel Pacing Threshold Pulse Width: 0.5 ms
Lead Channel Sensing Intrinsic Amplitude: 11.6 mV
Lead Channel Setting Pacing Amplitude: 2.5 V
Lead Channel Setting Pacing Pulse Width: 0.5 ms
Lead Channel Setting Sensing Sensitivity: 0.5 mV
Pulse Gen Serial Number: 9850980
Zone Setting Status: 755011

## 2024-02-10 ENCOUNTER — Telehealth (HOSPITAL_COMMUNITY): Payer: Self-pay

## 2024-02-10 NOTE — Telephone Encounter (Signed)
 [name] and I spoke and he reports feeling well- he received his new bubble packs with aspirin  and magnesium  inside and he has been taking same. No complaints today.   I will follow up next week.   [name], EMT-Paramedic [phone] 02/10/2024

## 2024-02-11 ENCOUNTER — Ambulatory Visit: Payer: Self-pay | Admitting: Cardiology

## 2024-02-12 NOTE — Progress Notes (Signed)
 Remote ICD Transmission

## 2024-02-16 ENCOUNTER — Ambulatory Visit: Attending: Cardiology

## 2024-02-16 DIAGNOSIS — Z95810 Presence of automatic (implantable) cardiac defibrillator: Secondary | ICD-10-CM | POA: Diagnosis not present

## 2024-02-16 DIAGNOSIS — I5022 Chronic systolic (congestive) heart failure: Secondary | ICD-10-CM

## 2024-02-16 NOTE — Progress Notes (Signed)
 EPIC Encounter for ICM Monitoring  Patient Name: [name] is a 59 y.o. male Date: 02/16/2024 Primary Care Physican: Delbert Clam, MD Primary Cardiologist: Rolan Electrophysiologist: Inocencio 02/17/2022 Weight: 142 lbs 08/22/2022 Office Weight: 138 lbs  10/15/2022 Weight:  139 lbs 12/23/2022 Office Weight: 141 lbs 04/02/2023 Office Weight:  140 lbs 05/04/2023 Office Weight: 136 lbs   05/11/2023 Office Weight: 139 lbs     06/24/2023 Office Weight: 146 lbs     10/26/2023 Office Weight: 141 lbs    12/21/2023 Office Weight: 142 lbs  01/12/2024 Office Weight: 143.9 lbs   SVT Episode 1 NVST Episodes  5                                      Attempted call to patient and unable to reach.  Transmission results reviewed.       Diet:  Does not follow low salt diet.   Since 01/04/2024 ICM Remote Transmission: Cor[employer]ue thoracic impedance suggesting possible fluid accumulation starting 01/27/2024.  Pt has history of SVT & NSVT episodes.    Prescribed:  [name] EMT Paramed program approved to assist with meds on 01/18/2024. Furosemide  20 mg take 4 tablets (80 mg total) by mouth daily.   Potassium 20 mEq take 3 tablet(s) (60 mEq total) by mouth daily. Spironolactone  25 mg take 1 tablet by mouth at bedtime    Labs: 01/12/2024 Creatinine 1.15, BUN 10, Potassium 3.5, Sodium 136, GFR >60, BNP 518 12/29/2023 Potassium 3.8, Sodium 136 12/21/2023 Potassium 4.2 12/07/2023 Creatinine 1.19, BUN 8,   Potassium 3.2, Sodium 134, GFR >60 10/26/2023 Creatinine 1.27, BUN 21, Potassium 4.0, Sodium 133, GFR >60 08/11/2023 Creatinine 1.27, BUN 22, Potassium 4.1, Sodium 134  06/22/2023 Creatinine 1.02, BUN 10, Potassium 3.4, Sodium 137, GFR >60  05/11/2023 Creatinine 1.19, BUN 15, Potassium 4.0, Sodium 136, GFR >60  A complete set of results can be found in Results Review.   Recommendations: Unable to reach.    Follow-up plan: ICM clinic 31 day follow up currently on hold by 91 day remote monitoring will  continue.      91 day device clinic remote transmission 05/09/2024.     EP/Cardiology Office Visits: 03/03/2024 with Dr Rolan.  01/27/2024 with Charlies Arthur, PA (for recurrent SVT).   Recall 04/04/2024 with Jodie Passey, PA.     Copy of ICM check sent to Dr. Inocencio.     Remote monitoring is medically necessary for Heart Failure Management.    Daily Thoracic Impedance ICM trend: 11/18/2023 through 02/16/2024.    12-14 Month Thoracic Impedance ICM trend:     [name], RN 02/16/2024 1:48 PM

## 2024-03-03 ENCOUNTER — Ambulatory Visit (HOSPITAL_COMMUNITY): Admitting: Cardiology

## 2024-03-03 ENCOUNTER — Ambulatory Visit (HOSPITAL_COMMUNITY)

## 2024-05-09 ENCOUNTER — Ambulatory Visit: Payer: Medicaid Other

## 2024-06-20 ENCOUNTER — Ambulatory Visit: Payer: Self-pay | Admitting: Family Medicine
